# Patient Record
Sex: Male | Born: 1955 | Race: White | Hispanic: No | Marital: Single | Smoking: Former smoker
Health system: Southern US, Community
[De-identification: ages and names within clinical notes are randomized; demographics above are authoritative.]

## PROBLEM LIST (undated history)

## (undated) DIAGNOSIS — I739 Peripheral vascular disease, unspecified: Secondary | ICD-10-CM

## (undated) DIAGNOSIS — M549 Dorsalgia, unspecified: Secondary | ICD-10-CM

## (undated) DIAGNOSIS — M1990 Unspecified osteoarthritis, unspecified site: Secondary | ICD-10-CM

## (undated) DIAGNOSIS — G8929 Other chronic pain: Secondary | ICD-10-CM

## (undated) DIAGNOSIS — J449 Chronic obstructive pulmonary disease, unspecified: Secondary | ICD-10-CM

## (undated) DIAGNOSIS — I639 Cerebral infarction, unspecified: Secondary | ICD-10-CM

## (undated) DIAGNOSIS — I10 Essential (primary) hypertension: Secondary | ICD-10-CM

## (undated) DIAGNOSIS — Z972 Presence of dental prosthetic device (complete) (partial): Secondary | ICD-10-CM

## (undated) DIAGNOSIS — K219 Gastro-esophageal reflux disease without esophagitis: Secondary | ICD-10-CM

## (undated) DIAGNOSIS — R7301 Impaired fasting glucose: Secondary | ICD-10-CM

## (undated) DIAGNOSIS — I4891 Unspecified atrial fibrillation: Secondary | ICD-10-CM

## (undated) DIAGNOSIS — G4730 Sleep apnea, unspecified: Secondary | ICD-10-CM

## (undated) DIAGNOSIS — R079 Chest pain, unspecified: Secondary | ICD-10-CM

## (undated) DIAGNOSIS — R0602 Shortness of breath: Secondary | ICD-10-CM

## (undated) HISTORY — DX: Unspecified osteoarthritis, unspecified site: M19.90

## (undated) HISTORY — DX: Unspecified atrial fibrillation: I48.91

## (undated) HISTORY — DX: Chest pain, unspecified: R07.9

## (undated) HISTORY — DX: Gastro-esophageal reflux disease without esophagitis: K21.9

## (undated) HISTORY — DX: Chronic obstructive pulmonary disease, unspecified: J44.9

## (undated) HISTORY — DX: Impaired fasting glucose: R73.01

## (undated) HISTORY — DX: Shortness of breath: R06.02

## (undated) HISTORY — DX: Presence of dental prosthetic device (complete) (partial): Z97.2

---

## 1990-06-15 HISTORY — PX: GASTRIC RESTRICTION SURGERY: SHX653

## 1996-06-15 HISTORY — PX: LEG SURGERY: SHX1003

## 2001-06-15 HISTORY — PX: FOOT SURGERY: SHX648

## 2010-12-04 ENCOUNTER — Encounter: Payer: Medicare Other | Attending: Internal Medicine | Admitting: *Deleted

## 2010-12-04 DIAGNOSIS — Z713 Dietary counseling and surveillance: Secondary | ICD-10-CM | POA: Insufficient documentation

## 2010-12-31 ENCOUNTER — Encounter: Payer: Medicare Other | Attending: Internal Medicine | Admitting: *Deleted

## 2010-12-31 ENCOUNTER — Encounter: Payer: Self-pay | Admitting: *Deleted

## 2010-12-31 VITALS — Ht 75.0 in | Wt >= 6400 oz

## 2010-12-31 DIAGNOSIS — E669 Obesity, unspecified: Secondary | ICD-10-CM | POA: Insufficient documentation

## 2010-12-31 DIAGNOSIS — Z713 Dietary counseling and surveillance: Secondary | ICD-10-CM | POA: Insufficient documentation

## 2010-12-31 NOTE — Patient Instructions (Signed)
   Follow Pre-Op Diet  Add protein shake to breakfast  Increase ADL's and exercise as able

## 2010-12-31 NOTE — Progress Notes (Addendum)
  Follow-up visit: Pre-Operative Gastric Bypass Surgery  Medical Nutrition Therapy:  Appt start time: 0900 end time:  0930.  Assessment:  Primary concerns today: pre-operative bariatric surgery nutrition management for 6 months supervised weight loss. This is Mr. Tiggs 3rd month of supervised weight loss.  Weight today: 455.2lb Weight change: 17.6lb lost in one month Total weight lost: 17.6 lbs BMI: 56.9 Weight goal: 250lb   Dietary intake: Pt reports that he is now consuming 2 meals/day instead of just one. He has also been practicing bariatric surgery "pre-op" goals, and has eliminated sweetened beverages.  Fluid intake: 64 oz + (crystal light, water, coffee)  Estimated total protein intake: Not reported  Medications: Oxycodone, Nabumeton, Gralise, Naproxen  Supplementation: N/A  Recent physical activity:  Limited secondary to back pain.  Progress Towards Goal(s):  In progress.   Nutritional Diagnosis:  NI-1.4 Inadequate energy intake As related to frequent meal skipping.  As evidenced by pt consuming <1000 kcals/day.    Intervention:    Follow Pre-Op Diet  Add protein shake to breakfast  Increase ADL's and exercise as able  Monitoring/Evaluation:  Dietary intake, exercise, lap band fills, and body weight. Follow up in 1 month.

## 2011-02-02 ENCOUNTER — Encounter: Payer: Medicare Other | Attending: Internal Medicine | Admitting: *Deleted

## 2011-02-02 ENCOUNTER — Encounter: Payer: Self-pay | Admitting: *Deleted

## 2011-02-02 VITALS — Ht 75.0 in | Wt >= 6400 oz

## 2011-02-02 DIAGNOSIS — E669 Obesity, unspecified: Secondary | ICD-10-CM

## 2011-02-02 DIAGNOSIS — Z713 Dietary counseling and surveillance: Secondary | ICD-10-CM | POA: Insufficient documentation

## 2011-02-02 NOTE — Patient Instructions (Signed)
GOALS: Follow Pre-Op Diet  Continue protein shake to breakfast  Increase ADL's and exercise as able per MD

## 2011-02-02 NOTE — Progress Notes (Addendum)
Follow-up visit: Pre-Operative Gastric Bypass Surgery   Medical Nutrition Therapy: Appt start time: 0830 end time: 0900.   Assessment: Primary concerns today: 4rd supervised weight loss visit for pre-operative bariatric surgery nutrition management for 6 months supervised weight loss. Pt reports that he is still struggling with smoking cessation. He smokes ~10-14 cigarettes per day, down from 3 packs/day. He discussed the possibility of starting Chantix per MD in the next couple of months.   Weight today: 438.2 lb  Weight change:17 lbs lost in one month  Total weight lost: 34.6 lbs  BMI: 54.7% Weight goal: 250lb   Dietary intake: Pt reports that he tries to consume 2-3 meals/day. He uses protein shakes for breakfast (Atkin's protein shakes). For lunch and dinner he prepares lean meat (3-5 oz) such as lean ground beef, [place], chicken or fish, a cup of vegetable, and a side salad. OR a lean frozen meal. He has also been practicing bariatric surgery "pre-op" goals, and has continued with avoiding all sweetened beverages.   Fluid intake: 64 oz + (crystal light, water, coffee)  Estimated total protein intake: ~80-90g  Medications: Oxycodone, Nabumeton, Gralise, Naproxen  Supplementation: N/A   Recent physical activity: Limited secondary to back pain.   Progress Towards Goal(s): In progress.   Nutritional Diagnosis:  Physical inactivity related to recent family stress, being out of town, and reported busy schedule as  evidenced  by pt with very limited amounts of walking.  Intervention:  Follow Pre-Op Diet  Continue protein shake to breakfast  Increase ADL's and exercise as able per MD  Monitoring/Evaluation: Dietary intake, exercise, and body weight. Follow up in 1 month.

## 2011-03-05 ENCOUNTER — Ambulatory Visit: Payer: Medicare Other | Admitting: *Deleted

## 2011-03-09 ENCOUNTER — Encounter: Payer: Medicare Other | Attending: Internal Medicine | Admitting: *Deleted

## 2011-03-09 ENCOUNTER — Encounter: Payer: Self-pay | Admitting: *Deleted

## 2011-03-09 DIAGNOSIS — E669 Obesity, unspecified: Secondary | ICD-10-CM

## 2011-03-09 DIAGNOSIS — Z713 Dietary counseling and surveillance: Secondary | ICD-10-CM | POA: Insufficient documentation

## 2011-03-09 NOTE — Patient Instructions (Addendum)
Follow Pre-Op Diet  Avoid skipping meals Continue protein shake to breakfast  Increase ADL's and exercise as able per MD

## 2011-03-09 NOTE — Progress Notes (Addendum)
  Follow-up visit: Pre-Operative Gastric Bypass Surgery   Medical Nutrition Therapy: Appt start time: 1230 end time: 1300.   Assessment: Primary concerns today: 5th supervised weight loss visit for pre-operative bariatric surgery nutrition management for 6 months supervised weight loss. Pt reports that he has been under an extreme amount of stress over the past 2 months as he lost his brother in [place] and spent about 2 weeks down there. He reports that he has additional stress due to his car which broke down as well. Despite stress, pt continues to eat as best he can and still has a net weight loss. Pt reports that after his brother passed away he has smoked a little but has cut his usage down significantly. Has not had 1 pack over the past month.  Weight today: 434.2 lb  Weight change: 4 lbs lost in one month  Total weight lost: 38.6 lbs  BMI: 54.4%  Weight goal: 250lb   Dietary intake: Pt reports that he tries to consume 2-3 meals/day. He was able to pick up a protein shake in [place] which he used as a meal replacement. If he was eating out in [place] he was able to choose high protein foods and non-starchy vegetables. He notes consuming salads with chicken at fast food restaurants. He notes a few episodes of meal skipping yet has tried to do the best he can.   Fluid intake: 64 oz + (crystal light, water, coffee)  Estimated total protein intake: ~80-90g   Medications: Oxycodone, Nabumeton, Gralise, Naproxen  Supplementation: N/A   Recent physical activity: Limited secondary to back pain and limited time   Progress Towards Goal(s): In progress.   Nutritional Diagnosis:  Physical inactivity related to recent family stress, being out of town, and reported busy schedule as evidenced by pt with very limited amounts of walking.   Intervention:  Follow Pre-Op Diet  Avoid skipping meals Continue protein shake to breakfast  Increase ADL's and exercise as able per  MD  Monitoring/Evaluation: Dietary intake, exercise, and body weight. Follow up in 1 month for continued supervised weight loss program.

## 2011-03-10 ENCOUNTER — Other Ambulatory Visit (HOSPITAL_BASED_OUTPATIENT_CLINIC_OR_DEPARTMENT_OTHER): Payer: Self-pay | Admitting: Internal Medicine

## 2011-03-10 DIAGNOSIS — Z9884 Bariatric surgery status: Secondary | ICD-10-CM

## 2011-03-18 ENCOUNTER — Other Ambulatory Visit (HOSPITAL_COMMUNITY): Payer: Medicare Other

## 2011-03-25 ENCOUNTER — Other Ambulatory Visit (HOSPITAL_COMMUNITY): Payer: Medicare Other

## 2011-04-02 ENCOUNTER — Other Ambulatory Visit (HOSPITAL_BASED_OUTPATIENT_CLINIC_OR_DEPARTMENT_OTHER): Payer: Self-pay | Admitting: Internal Medicine

## 2011-04-02 DIAGNOSIS — Z9884 Bariatric surgery status: Secondary | ICD-10-CM

## 2011-04-06 ENCOUNTER — Other Ambulatory Visit (HOSPITAL_COMMUNITY): Payer: Medicare Other

## 2011-04-07 ENCOUNTER — Encounter: Payer: Medicare Other | Admitting: *Deleted

## 2011-04-07 ENCOUNTER — Other Ambulatory Visit (HOSPITAL_BASED_OUTPATIENT_CLINIC_OR_DEPARTMENT_OTHER): Payer: Self-pay | Admitting: Internal Medicine

## 2011-04-07 DIAGNOSIS — Z9884 Bariatric surgery status: Secondary | ICD-10-CM

## 2011-04-07 NOTE — Progress Notes (Deleted)
  Follow-up visit: Pre-Operative Gastric Bypass Surgery   Medical Nutrition Therapy: Appt start time: 1230 end time: 1300.   Assessment: Primary concerns today: 4th supervised weight loss visit for pre-operative bariatric surgery nutrition management for 6 months supervised weight loss. Pt reports that he has been under an extreme amount of stress over the past 2 months as he lost his brother in [place] and spent about 2 weeks down there. He reports that he has additional stress due to his car which broke down as well. Despite stress, pt continues to eat as best he can and still has a net weight loss. Pt reports that after his brother passed away he has smoked a little but has cut his usage down significantly. Has not had 1 pack over the past month.   Weight today: 434.2 lb  Weight change: 4 lbs lost in one month  Total weight lost: 38.6 lbs  BMI: 54.4%  Weight goal: 250lb   Dietary intake: Pt reports that he tries to consume 2-3 meals/day. He was able to pick up a protein shake in [place] which he used as a meal replacement. If he was eating out in [place] he was able to choose high protein foods and non-starchy vegetables. He notes consuming salads with chicken at fast food restaurants. He notes a few episodes of meal skipping yet has tried to do the best he can.   Fluid intake: 64 oz + (crystal light, water, coffee)  Estimated total protein intake: ~80-90g   Medications: Oxycodone, Nabumeton, Gralise, Naproxen  Supplementation: N/A   Recent physical activity: Limited secondary to back pain and limited time   Progress Towards Goal(s): In progress.   Nutritional Diagnosis:  Physical inactivity related to recent family stress, being out of town, and reported busy schedule as evidenced by pt with very limited amounts of walking.   Intervention:  Follow Pre-Op Diet  Avoid skipping meals  Continue protein shake to breakfast  Increase ADL's and exercise as able per  MD  Monitoring/Evaluation: Dietary intake, exercise, and body weight. Follow up in 1 month for continued supervised weight loss program.

## 2011-04-10 ENCOUNTER — Other Ambulatory Visit (HOSPITAL_BASED_OUTPATIENT_CLINIC_OR_DEPARTMENT_OTHER): Payer: Self-pay | Admitting: Internal Medicine

## 2011-04-10 ENCOUNTER — Ambulatory Visit (HOSPITAL_COMMUNITY)
Admission: RE | Admit: 2011-04-10 | Discharge: 2011-04-10 | Disposition: A | Discharge status: Home / Self Care | Payer: Medicare Other | Source: Ambulatory Visit | Attending: Internal Medicine | Admitting: Internal Medicine

## 2011-04-10 DIAGNOSIS — Z9884 Bariatric surgery status: Secondary | ICD-10-CM

## 2011-04-10 DIAGNOSIS — Z01818 Encounter for other preprocedural examination: Secondary | ICD-10-CM | POA: Insufficient documentation

## 2011-04-10 IMAGING — CR DG UGI W/ KUB
11 of 15 series · 15 of 24 positions shown · non-contrast
Comparison: None.

CLINICAL DATA: Prior gastric stapling, preop gastric bypass

UPPER GI SERIES WITH KUB
TECHNIQUE: Routine upper GI series was performed with thin and
high density barium.
Fluoroscopy Time: 1.23 minutes

[Series 1: run · 3 of 7 slices shown (1 of 10)]
[im 1/7]
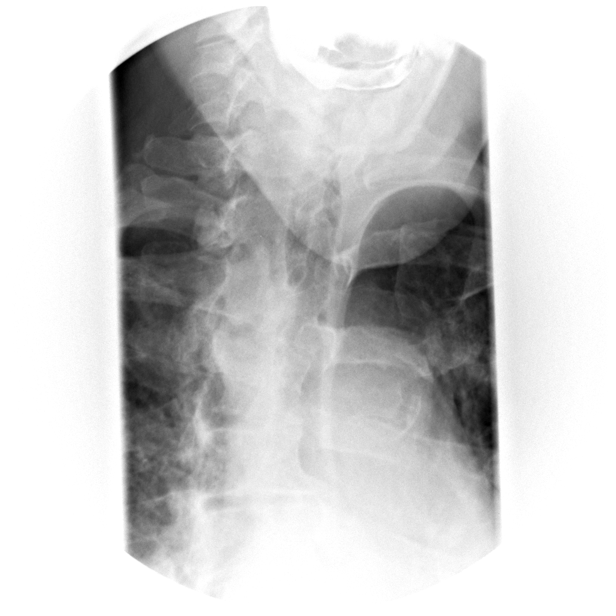
[im 4/7]
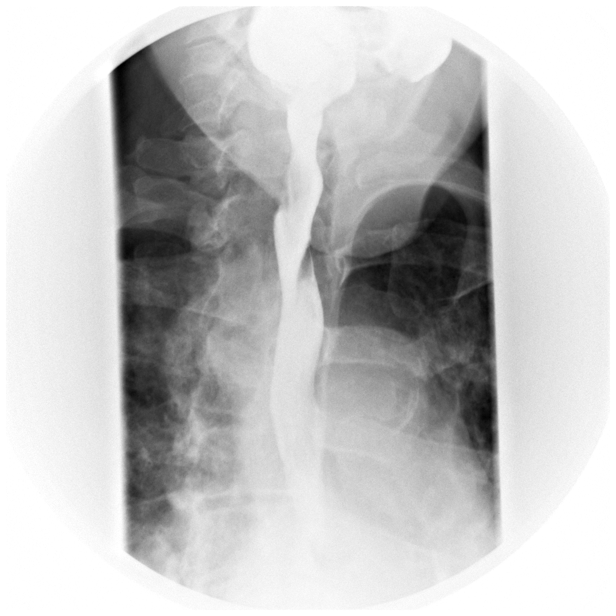
[im 7/7]
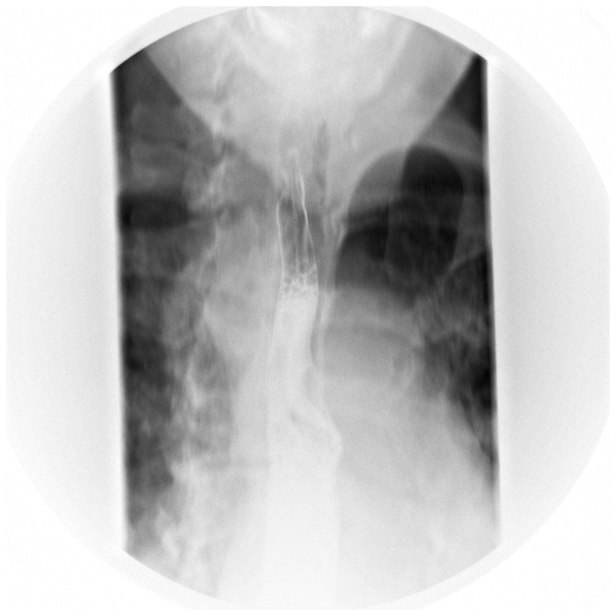

[Series 2: run · 2 of 4 slices shown (2 of 10)]
[im 1/4]
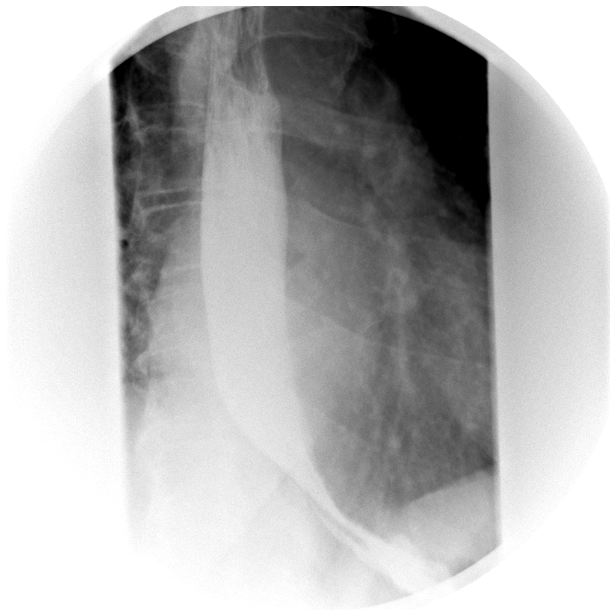
[im 4/4]
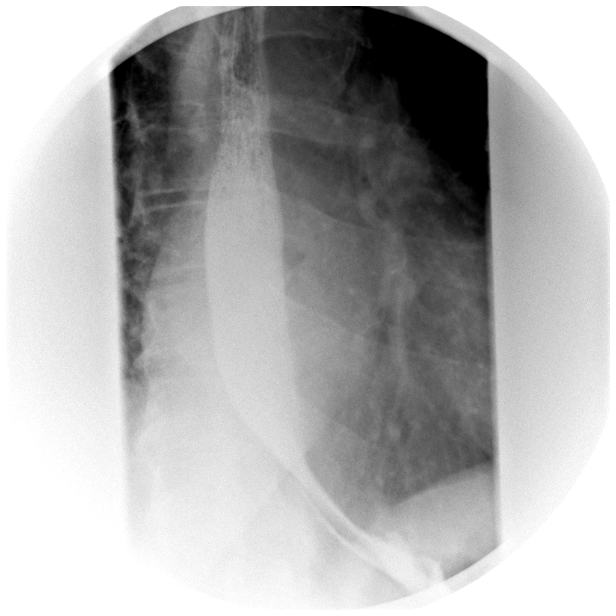

[run (3 of 10)]
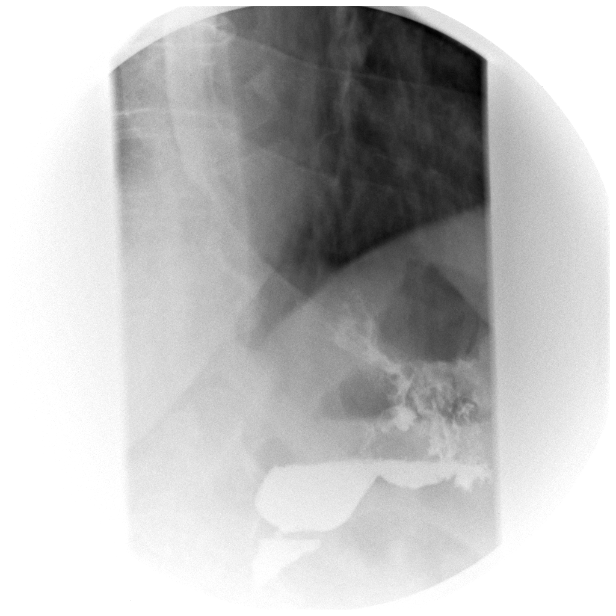

[Series 4: run · 2 of 3 slices shown (4 of 10)]
[im 1/3]
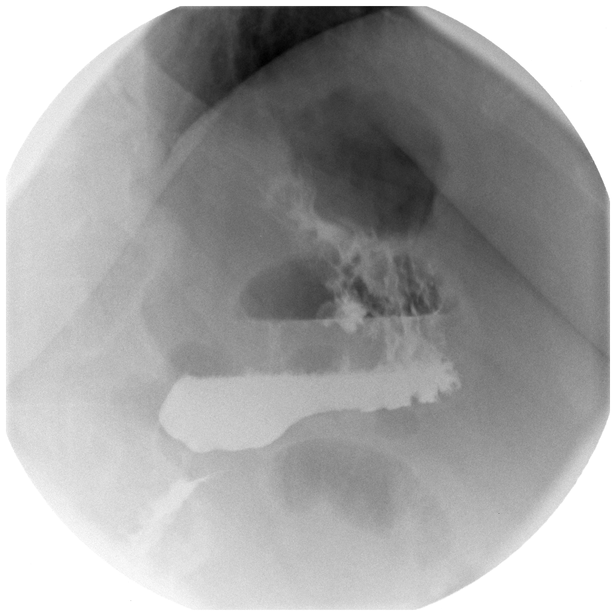
[im 3/3]
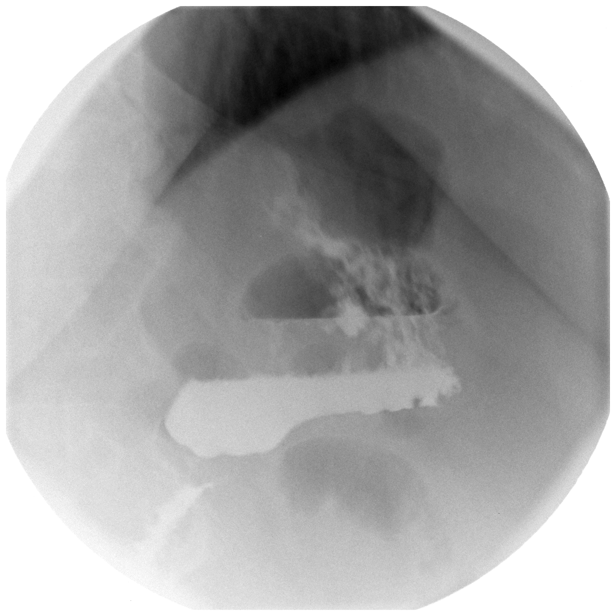

[run (5 of 10)]
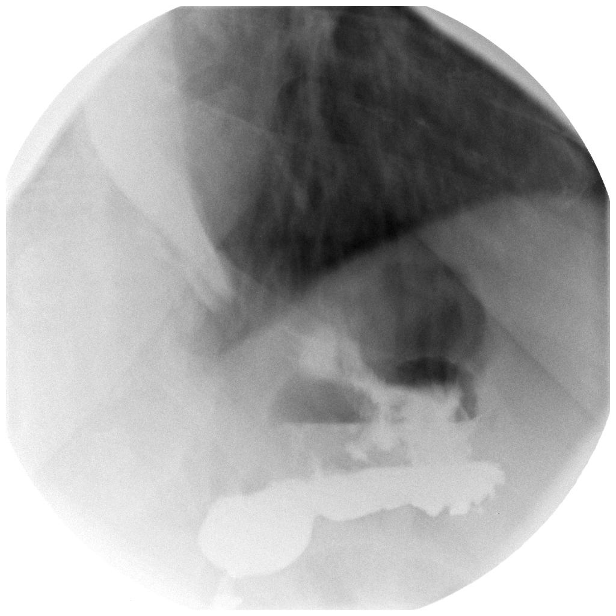

[run (6 of 10)]
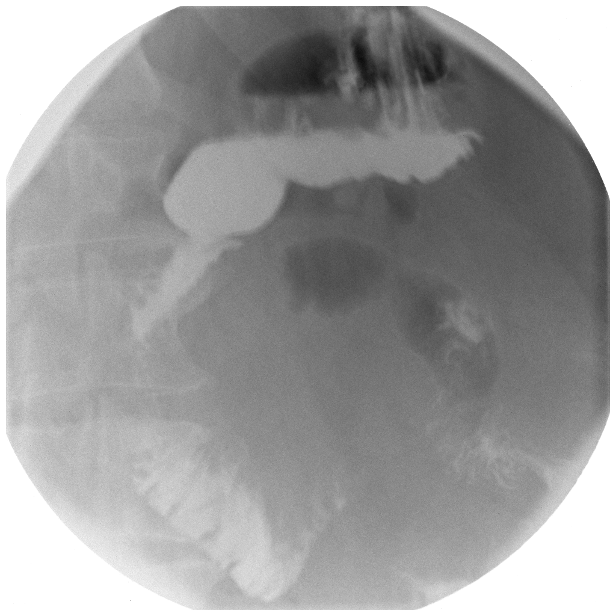

[run (7 of 10)]
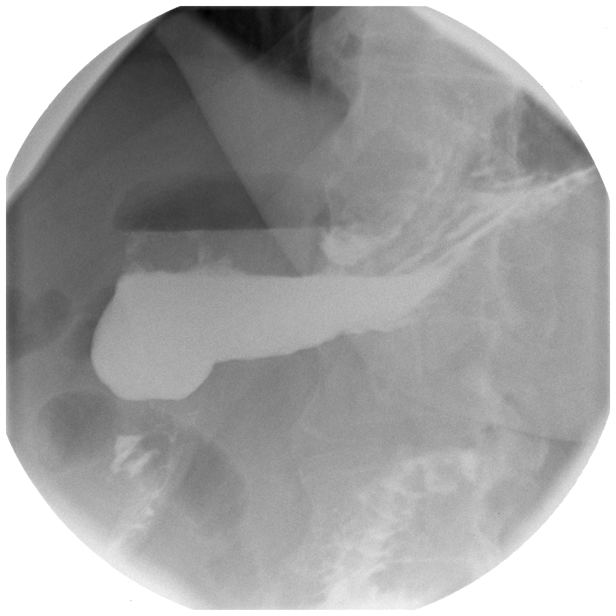

[run (8 of 10)]
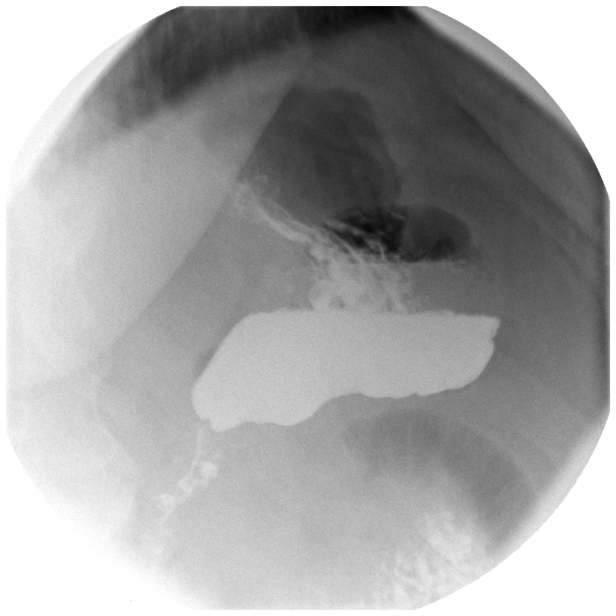

[run (9 of 10)]
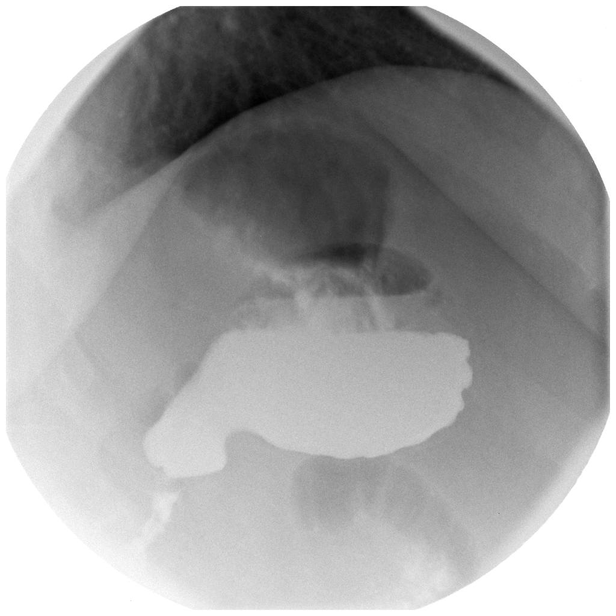

[run (10 of 10)]
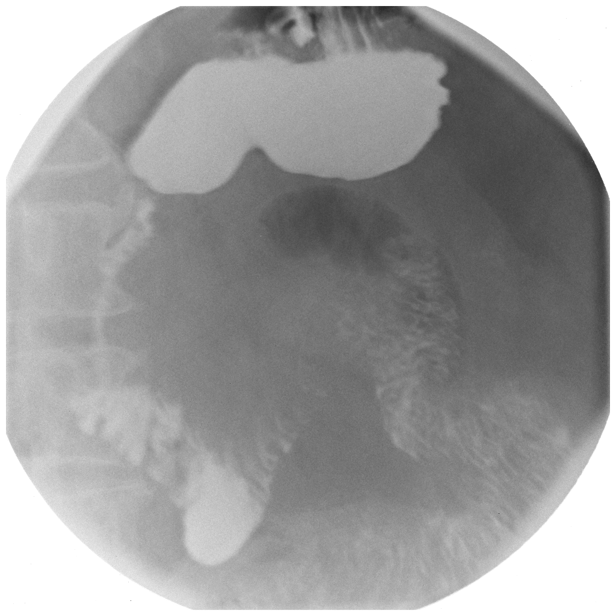

[view not recorded]
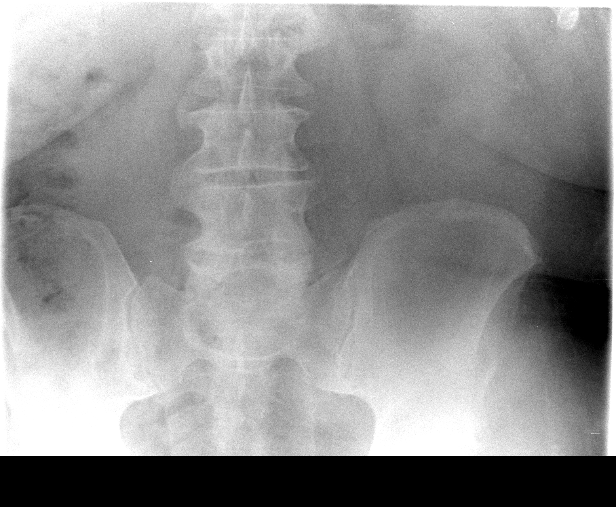

[15 of 24 positions shown; findings below may reference images not displayed]

FINDINGS: Scout radiograph demonstrates surgical clips in the
region of the GE junction.

Normal oral phase of swallowing.  No laryngeal penetration or
tracheal aspiration.

Essentially normal esophageal peristalsis.  No fixed esophageal
narrowing or stricture.

No gastroesophageal reflux was demonstrated.

Gas within the presumed excluded stomach, suggesting some degree of
communication with the remaining stomach.

Following an initial delay, contrast flows into the normal proximal
duodenum.
IMPRESSION: Surgical clips related to prior gastric stapling.  Gas within the
presumed excluded stomach, suggesting some degree of communication
with the remaining stomach.

No gastroesophageal reflux is seen.

Normal proximal duodenum.

## 2011-04-16 ENCOUNTER — Encounter: Payer: Medicare Other | Attending: Surgery | Admitting: *Deleted

## 2011-04-16 ENCOUNTER — Encounter: Payer: Self-pay | Admitting: *Deleted

## 2011-04-16 DIAGNOSIS — E669 Obesity, unspecified: Secondary | ICD-10-CM

## 2011-04-16 DIAGNOSIS — Z713 Dietary counseling and surveillance: Secondary | ICD-10-CM | POA: Insufficient documentation

## 2011-04-16 NOTE — Patient Instructions (Signed)
Goals: Follow Pre-Op Diet  Avoid skipping meals  Limit concentrated sweets and sugars Try using protein shakes for breakfast/snacks Increase ADL's and exercise as able per MD

## 2011-04-16 NOTE — Progress Notes (Signed)
  Follow-up visit: Pre-Operative Gastric Bypass Surgery   Medical Nutrition Therapy: Appt start time: 0824 end time: 0853.   Assessment: Primary concerns today: 6th supervised weight loss visit for pre-operative bariatric surgery nutrition management for 6 months supervised weight loss. Kyle [name] reports that he continues to have excessive stress in his home life due to family issues and financial problems with social security. Due to his weight he continues to have low energy levels. Despite all of his hardship, he has done fairly well with his dietary intake yet notes limited physical activity due to his schedule and dealing with issues.  Weight today: 433.2 lb  Weight change: 1 lbs lost in one month  Total weight lost: 39.6 lbs  BMI: 50.2%  Weight goal: 250lb   Dietary Intake:  B (7-8): Special K w/ berries (1 1/2 cup) w/ skim milk) OR 2 eggs (cooked in pam) S (10): Handful of almonds L (12-1): Chicken breast or Tuna w/ 1 cup vegetable or small salad S (4-6): Atkin's snack protein bars) D (6-8): 4-6 oz hamburger patty (lean), small salad w/ light dressing or 1 cup green beans S (8-9): Some candy?  Fluid intake: 64 oz + (crystal light, water, coffee, all sugar-free)  Estimated total protein intake: ~80-90g   Medications: Oxycodone, Nabumeton, Gralise, Naproxen, Coumadin Supplementation: N/A   Recent physical activity: Limited secondary to back pain and limited time; Trying to do more walking through ADL's  Progress Towards Goal(s): Some progress.   Nutritional Diagnosis:  Physical inactivity related to recent family stress, being out of town, and reported busy schedule as evidenced by pt with very limited amounts of walking.   Excessive carbohydrate intake related to stress eating as evidenced by pt consuming more candy and sweets due to emotional stress.  Intervention:  Follow Pre-Op Diet  Avoid skipping meals  Limit concentrated sweets and sugars Try using protein shakes  for breakfast/snacks Increase ADL's and exercise as able per MD  Monitoring/Evaluation: Dietary intake, exercise, and body weight. Follow up with [facility] and PCP on additional surgery requirements.

## 2011-05-05 ENCOUNTER — Encounter (INDEPENDENT_AMBULATORY_CARE_PROVIDER_SITE_OTHER): Payer: Self-pay | Admitting: General Surgery

## 2011-05-06 ENCOUNTER — Encounter (INDEPENDENT_AMBULATORY_CARE_PROVIDER_SITE_OTHER): Payer: Self-pay | Admitting: Surgery

## 2011-05-06 ENCOUNTER — Ambulatory Visit (INDEPENDENT_AMBULATORY_CARE_PROVIDER_SITE_OTHER): Payer: Medicare Other | Admitting: Surgery

## 2011-05-06 VITALS — BP 132/78 | HR 64 | Temp 97.2°F | Resp 20 | Ht 78.0 in | Wt >= 6400 oz

## 2011-05-06 DIAGNOSIS — E669 Obesity, unspecified: Secondary | ICD-10-CM

## 2011-05-06 NOTE — Progress Notes (Signed)
Patient ID: Kyle [name], male   DOB: 06-22-1955, 55 y.o.   MRN: [id number] Chief Complaint:  Failed gastric plication  History of Present Illness:  Kyle [name] is an 55 y.o. male who underwent a gastric plication in [place] in 1994 by a Meridee Score at [facility]. His postoperative followup consisted of staple removal but he received no counseling. An upper GI series which are reviewed looks like he may have had a transverse gastroplasty is partially broken down. He is interested in a gastric bypass after one one of our seminars. I think the next step would be crossed in endoscope him and I have set that up for  Dr. Ezzard Standing and myself.  Past Medical History  Diagnosis Date  . Atrial fibrillation   . GERD (gastroesophageal reflux disease)   . Shortness of breath   . Arthritis   . Wears dentures     Past Surgical History  Procedure Date  . Gastric restriction surgery 1992  . Leg surgery 1998    calcium deposit removed on right  leg   . Foot surgery 2003    right foot surgery from work accident     Medications Prior to Admission  Medication Sig Dispense Refill  . nabumetone (RELAFEN) 500 MG tablet Take 500 mg by mouth 2 (two) times daily.        . naproxen (NAPROSYN) 250 MG tablet Take 250 mg by mouth at bedtime as needed.        Marland Kitchen oxycodone (OXYCONTIN) 30 MG TB12 Take 30 mg by mouth every 12 (twelve) hours.        Marland Kitchen warfarin (COUMADIN) 7.5 MG tablet Take 7.5 mg by mouth daily.         No current facility-administered medications on file as of 05/06/2011.   No Known Allergies History reviewed. No pertinent family history. Social History:   reports that he quit smoking about 2 months ago. He has never used smokeless tobacco. He reports that he drinks alcohol. He reports that he does not use illicit drugs.   REVIEW OF SYSTEMS - PERTINENT POSITIVES ONLY: noncontributory  Physical Exam:   Blood pressure 132/78, pulse 64, temperature 97.2 F (36.2 C), temperature  source Temporal, resp. rate 20, height 6\' 6"  (1.981 m), weight 418 lb 8 oz (189.83 kg). Body mass index is 48.36 kg/(m^2).  Gen:  No acute distress.  Well nourished and well groomed.   Neurological: Alert and oriented to person, place, and time. Coordination normal.  Head: Normocephalic and atraumatic.  Eyes: Conjunctivae are normal. Pupils are equal, round, and reactive to light. No scleral icterus.  Neck: Normal range of motion. Neck supple. No tracheal deviation or thyromegaly present.  Cardiovascular:  Atrial fibrillation  Respiratory: Effort normal.  No respiratory distress. No chest wall tenderness. Breath sounds normal.  No wheezes, rales or rhonchi.  GI: Soft. Bowel sounds are normal. The abdomen is soft and nontender.  There is no rebound and no guarding. GU:   Musculoskeletal: Normal range of motion. Extremities are nontender.  Lymphadenopathy: No cervical, preauricular, postauricular or axillary adenopathy is present Skin: Skin is warm and dry. No rash noted. No diaphoresis. No erythema. No pallor. No clubbing, cyanosis, or edema.  Pscyh: Normal mood and affect. Behavior is normal. Judgment and thought content normal.   LABORATORY RESULTS: No results found for this or any previous visit (from the past 48 hour(s)).  RADIOLOGY RESULTS: No results found.  Problem List: Active Problems:  * No active hospital  problems. *    Assessment & Plan: Failed gastroplasty.  Will schedule EGD to assess anatomy    Matt B. Daphine Deutscher, MD, [facility] [phone] beeper [phone]  05/06/2011 5:12 PM

## 2011-05-18 ENCOUNTER — Encounter (HOSPITAL_COMMUNITY): Payer: Self-pay | Admitting: *Deleted

## 2011-05-18 ENCOUNTER — Ambulatory Visit (HOSPITAL_COMMUNITY)
Admission: RE | Admit: 2011-05-18 | Discharge: 2011-05-18 | Disposition: A | Discharge status: Home / Self Care | Payer: Medicare Other | Source: Ambulatory Visit | Attending: Surgery | Admitting: Surgery

## 2011-05-18 ENCOUNTER — Encounter (HOSPITAL_COMMUNITY)
Admission: RE | Disposition: A | Discharge status: Home / Self Care | Payer: Self-pay | Source: Ambulatory Visit | Attending: Surgery

## 2011-05-18 DIAGNOSIS — Z79899 Other long term (current) drug therapy: Secondary | ICD-10-CM | POA: Insufficient documentation

## 2011-05-18 DIAGNOSIS — I4891 Unspecified atrial fibrillation: Secondary | ICD-10-CM | POA: Insufficient documentation

## 2011-05-18 DIAGNOSIS — Z7901 Long term (current) use of anticoagulants: Secondary | ICD-10-CM | POA: Insufficient documentation

## 2011-05-18 DIAGNOSIS — K449 Diaphragmatic hernia without obstruction or gangrene: Secondary | ICD-10-CM

## 2011-05-18 DIAGNOSIS — K9589 Other complications of other bariatric procedure: Secondary | ICD-10-CM | POA: Insufficient documentation

## 2011-05-18 DIAGNOSIS — Z87891 Personal history of nicotine dependence: Secondary | ICD-10-CM | POA: Insufficient documentation

## 2011-05-18 DIAGNOSIS — K219 Gastro-esophageal reflux disease without esophagitis: Secondary | ICD-10-CM | POA: Insufficient documentation

## 2011-05-18 DIAGNOSIS — Y838 Other surgical procedures as the cause of abnormal reaction of the patient, or of later complication, without mention of misadventure at the time of the procedure: Secondary | ICD-10-CM | POA: Insufficient documentation

## 2011-05-18 DIAGNOSIS — E669 Obesity, unspecified: Secondary | ICD-10-CM

## 2011-05-18 HISTORY — PX: ESOPHAGOGASTRODUODENOSCOPY: SHX5428

## 2011-05-18 SURGERY — EGD (ESOPHAGOGASTRODUODENOSCOPY)
Anesthesia: Moderate Sedation

## 2011-05-18 MED ORDER — FENTANYL CITRATE 0.05 MG/ML IJ SOLN
INTRAMUSCULAR | Status: AC
  Filled 2011-05-18: qty 4

## 2011-05-18 MED ORDER — BUTAMBEN-TETRACAINE-BENZOCAINE 2-2-14 % EX AERO
INHALATION_SPRAY | CUTANEOUS | Status: DC | PRN
  Administered 2011-05-18: 3 via TOPICAL

## 2011-05-18 MED ORDER — SODIUM CHLORIDE 0.9 % IV SOLN
INTRAVENOUS | Status: DC
  Administered 2011-05-18: 10:00:00 via INTRAVENOUS

## 2011-05-18 MED ORDER — MIDAZOLAM HCL 10 MG/2ML IJ SOLN
INTRAMUSCULAR | Status: DC | PRN
  Administered 2011-05-18: 1 mg via INTRAVENOUS
  Administered 2011-05-18: 2 mg via INTRAVENOUS
  Administered 2011-05-18: 1 mg via INTRAVENOUS

## 2011-05-18 MED ORDER — MIDAZOLAM HCL 10 MG/2ML IJ SOLN
INTRAMUSCULAR | Status: AC
  Filled 2011-05-18: qty 4

## 2011-05-18 MED ORDER — FENTANYL CITRATE 0.05 MG/ML IJ SOLN
INTRAMUSCULAR | Status: DC | PRN
  Administered 2011-05-18 (×2): 25 ug via INTRAVENOUS

## 2011-05-18 NOTE — H&P (View-Only) (Signed)
Patient ID: Kyle [name], male   DOB: 06/30/1955, 55 y.o.   MRN: [id number] Chief Complaint:  Failed gastric plication  History of Present Illness:  Kyle [name] is an 55 y.o. male who underwent a gastric plication in [place] in 1994 by a Michael Butler at [facility]. His postoperative followup consisted of staple removal but he received no counseling. An upper GI series which are reviewed looks like he may have had a transverse gastroplasty is partially broken down. He is interested in a gastric bypass after one one of our seminars. I think the next step would be crossed in endoscope him and I have set that up for  Dr. Newman and myself.  Past Medical History  Diagnosis Date  . Atrial fibrillation   . GERD (gastroesophageal reflux disease)   . Shortness of breath   . Arthritis   . Wears dentures     Past Surgical History  Procedure Date  . Gastric restriction surgery 1992  . Leg surgery 1998    calcium deposit removed on right  leg   . Foot surgery 2003    right foot surgery from work accident     Medications Prior to Admission  Medication Sig Dispense Refill  . nabumetone (RELAFEN) 500 MG tablet Take 500 mg by mouth 2 (two) times daily.        . naproxen (NAPROSYN) 250 MG tablet Take 250 mg by mouth at bedtime as needed.        . oxycodone (OXYCONTIN) 30 MG TB12 Take 30 mg by mouth every 12 (twelve) hours.        . warfarin (COUMADIN) 7.5 MG tablet Take 7.5 mg by mouth daily.         No current facility-administered medications on file as of 05/06/2011.   No Known Allergies History reviewed. No pertinent family history. Social History:   reports that he quit smoking about 2 months ago. He has never used smokeless tobacco. He reports that he drinks alcohol. He reports that he does not use illicit drugs.   REVIEW OF SYSTEMS - PERTINENT POSITIVES ONLY: noncontributory  Physical Exam:   Blood pressure 132/78, pulse 64, temperature 97.2 F (36.2 C), temperature  source Temporal, resp. rate 20, height 6' 6" (1.981 m), weight 418 lb 8 oz (189.83 kg). Body mass index is 48.36 kg/(m^2).  Gen:  No acute distress.  Well nourished and well groomed.   Neurological: Alert and oriented to person, place, and time. Coordination normal.  Head: Normocephalic and atraumatic.  Eyes: Conjunctivae are normal. Pupils are equal, round, and reactive to light. No scleral icterus.  Neck: Normal range of motion. Neck supple. No tracheal deviation or thyromegaly present.  Cardiovascular:  Atrial fibrillation  Respiratory: Effort normal.  No respiratory distress. No chest wall tenderness. Breath sounds normal.  No wheezes, rales or rhonchi.  GI: Soft. Bowel sounds are normal. The abdomen is soft and nontender.  There is no rebound and no guarding. GU:   Musculoskeletal: Normal range of motion. Extremities are nontender.  Lymphadenopathy: No cervical, preauricular, postauricular or axillary adenopathy is present Skin: Skin is warm and dry. No rash noted. No diaphoresis. No erythema. No pallor. No clubbing, cyanosis, or edema.  Pscyh: Normal mood and affect. Behavior is normal. Judgment and thought content normal.   LABORATORY RESULTS: No results found for this or any previous visit (from the past 48 hour(s)).  RADIOLOGY RESULTS: No results found.  Problem List: Active Problems:  * No active hospital   problems. *    Assessment & Plan: Failed gastroplasty.  Will schedule EGD to assess anatomy    Matt B. Ioannis Schuh, MD, FACS  [facility] [phone] beeper [phone]  05/06/2011 5:12 PM      

## 2011-05-18 NOTE — Interval H&P Note (Signed)
History and Physical Interval Note:  05/18/2011 10:52 AM  Kyle [name]  has presented today for surgery, with the diagnosis of * No pre-op diagnosis entered *  The various methods of treatment have been discussed with the patient and family. After consideration of risks, benefits and other options for treatment, the patient has consented to  Procedure(s): ESOPHAGOGASTRODUODENOSCOPY (EGD) as a surgical intervention .  The patients' history has been reviewed, patient examined, no change in status, stable for surgery.  I have reviewed the patients' chart and labs.  Questions were answered to the patient's satisfaction.     [name] H

## 2011-05-18 NOTE — Interval H&P Note (Signed)
History and Physical Interval Note:  05/18/2011 10:51 AM  Kyle [name]  has presented today for surgery, with the diagnosis of * No pre-op diagnosis entered *  The various methods of treatment have been discussed with the patient and family. After consideration of risks, benefits and other options for treatment, the patient has consented to  Procedure(s): ESOPHAGOGASTRODUODENOSCOPY (EGD) as a surgical intervention .  The patients' history has been reviewed, patient examined, no change in status, stable for surgery.  I have reviewed the patients' chart and labs.  Questions were answered to the patient's satisfaction.     Kyle [name] H

## 2011-05-18 NOTE — Op Note (Signed)
Kyle [name], Kyle [name]              ACCOUNT NO.:  [id number]  MEDICAL RECORD NO.:  [id number]  LOCATION:  [facility]                         FACILITY:  [facility]  PHYSICIAN:  Sandria Bales. Ezzard Standing, M.D.  DATE OF BIRTH:  1955/07/02  DATE OF PROCEDURE:  05/18/2011                              OPERATIVE REPORT  PREOPERATIVE DIAGNOSIS:  History of stomach stapling in 1994, in [place].  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  PROCEDURE:  Esophagogastroduodenoscopy.  SURGEON:  Sandria Bales. Ezzard Standing, M.D.  FIRST ASSISTANT:  None.  ANESTHESIA:  50 mcg of fentanyl, 4 mg of Versed.  COMPLICATIONS:  None.  INDICATION FOR PROCEDURE:  Kyle [name] is a 55 year old white male who sees Dr. Baltazar Najjar from a medical standpoint, Dr. Susa Griffins from a cardiology standpoint, seeing Dr. [name] with a history of a prior stomach stapling in 1994 in [place].  He has failed to lose weight and is interested in pursuing other avenues for weight loss surgery.  The indication and potential complications of this procedure were explained to the patient.  The patient is on Coumadin and understands the additional risks of bleeding in addition to risk of perforation and injuries using the endoscope.  OPERATIVE NOTE:  A time-out was held.  The patient was monitored with pulse oximetry, blood pressure cuff, EKG, and had 2L nasal O2 flowing during the procedure.  The back of his throat was anesthetized with Cetacaine x3.  He was then given a total of 50 mcg of fentanyl, 4 mg of Versed, and a flexible Pentax endoscope was passed down the back of the throat into the stomach pouch.  I advanced the scope through the pylorus into the duodenum, which was unremarkable.  The pylorus was unremarkable.  The stomach looked as if there was a long pouch, which went from the EG junction (at about 40 cm) down to an opening, which was at 55 cm (from the incisors) which is a 15 cm pouch. The  staple line was broken down along the greater curvature side. The breakdown opening is at 45 cm. I was able to pass the scope around there and took pictures of this.  There was no ulcer.  I think the pouch is large with a breakdown of the staple line.  I was able to put the scope through both the breakdown of staple line and the the end of the pouch and retroflex and take pictures of this.  The patient has a small hiatal hernia about 3 cm in size and the EG junction was at 40 cm and the esophagus was unremarkable.  I did not do any biopsy of the stomach because the patient is on Coumadin and there was nothing to biopsy from a mass or lesion standpoint.  The patient will follow up with Dr. Luretha Murphy, MD to discuss the options for him both medical and surgical for weight loss.  The patient tolerated the procedure well and was transferred to recovery room in good condition.   Sandria Bales. Ezzard Standing, M.D., FACS   DHN/MEDQ  D:  05/18/2011  T:  05/18/2011  Job:  [id number]  cc:   Maxwell Caul, M.D. Fax:  [phone]  Richard A. Alanda Amass, M.D. Fax: [phone]  [place] Daphine Deutscher, MD [address], [address] [place] [address]

## 2011-05-18 NOTE — Brief Op Note (Signed)
05/18/2011  11:33 AM  PATIENT:  Kyle [name], 55 y.o., male, MRN: [id number]  PREOP DIAGNOSIS:  History of stomach stapling (1994)  POSTOP DIAGNOSIS:   Failed stomach stapling, 3 cm HH  PROCEDURE:   Procedure(s): ESOPHAGOGASTRODUODENOSCOPY (EGD)  SURGEON:   Ovidio Kin, M.D.  ASSISTANT:   none  ANESTHESIA:   IV sedation  * No anesthesia staff entered *  Moderate Sedation  EBL:  -  ml  BLOOD ADMINISTERED: none  DRAINS: none   LOCAL MEDICATIONS USED:   -  SPECIMEN:   -  COUNTS CORRECT:  YES  INDICATIONS FOR PROCEDURE:  Kyle [name] is a 55 y.o. (DOB: 1956-06-11) white male whose primary care physician is Terald Sleeper, MD and comes for upper endoscopy to evaluate a prior stomach "stapling" in [place] in 1994.   The indications and risks of the surgery were explained to the patient.  The risks include, but are not limited to, infection, bleeding, and nerve injury.  Note dictated to:   #[id number]  DN  05/18/2011

## 2011-05-19 ENCOUNTER — Ambulatory Visit: Payer: Medicare Other | Admitting: *Deleted

## 2011-05-28 ENCOUNTER — Encounter: Payer: Self-pay | Admitting: *Deleted

## 2011-05-28 ENCOUNTER — Encounter: Payer: Medicare Other | Attending: Surgery | Admitting: *Deleted

## 2011-05-28 DIAGNOSIS — Z713 Dietary counseling and surveillance: Secondary | ICD-10-CM | POA: Insufficient documentation

## 2011-05-28 NOTE — Progress Notes (Signed)
  Pre-Op visit: Pre-Operative Gastric Bypass Surgery  Medical Nutrition Therapy:  Appt start time: 1055 end time:  1120.  Assessment:  Primary concerns today: post-operative bariatric surgery nutrition management. Pt still plans to have Gastric Bypass surgery per Dr. Daphine Deutscher. He recently had procedure done with Dr. Ezzard Standing and has a follow-up appointment with Dr. Daphine Deutscher next week to discuss possible plans for surgery. Pt continues to lose weight following Pre-Op Bariatric Surgery Diet and reports increase exercise levels.  Weight today: 420.2 lbs Weight change: 13 lbs since last visit Total weight lost: 52.6 lbs total BMI: 48.7% Weight goal: 250 lbs   Surgery date: TBA Start weight at [facility]: 455.2 lbs  24-hr recall: No food recall given at time of visit  Fluid intake: 64-100 oz + Estimated total protein intake: 80-95g  Medications: No changes since last visit Supplementation: Not taking any supplement at this time  Using straws: NO Drinking while eating: No Hair loss: n/a Carbonated beverages: No N/V/D/C: n/a  Recent physical activity:  Walking/lifting weights (5 times/week for 45 minutes+)  Progress Towards Goal(s):  In progress.  Handouts given during visit include:  Pre-Op Diet  Protein Shake Supplements   Nutritional Diagnosis:  [place]-3.3 Overweight/obesity As related to s/p "failed" stomach stapling surgery.  As evidenced by pt with BMI >30%.    Intervention:  Nutrition education/reinforcement.  Monitoring/Evaluation:  Dietary intake, exercise, lap band fills, and body weight. Follow up PRN for Pre-Op Nutrition Education as surgery is scheduled.

## 2011-05-28 NOTE — Patient Instructions (Signed)
Follow:    Continue to follow Pre-Op Diet (High Protein, Low Carb Diet) prior to surgery  Continue with regular physical activity  Follow-up at [facility] for Pre-Op Nutrition Class as surgery is scheduled. Contact Jeryn Cerney as needed with questions/concerns.

## 2011-06-01 ENCOUNTER — Encounter (HOSPITAL_COMMUNITY): Payer: Self-pay | Admitting: Surgery

## 2011-06-03 ENCOUNTER — Encounter (INDEPENDENT_AMBULATORY_CARE_PROVIDER_SITE_OTHER): Payer: Self-pay | Admitting: Surgery

## 2011-06-03 ENCOUNTER — Ambulatory Visit (INDEPENDENT_AMBULATORY_CARE_PROVIDER_SITE_OTHER): Payer: Medicare Other | Admitting: Surgery

## 2011-06-03 VITALS — BP 142/82 | HR 82 | Temp 97.9°F | Resp 18 | Ht 78.0 in | Wt >= 6400 oz

## 2011-06-03 DIAGNOSIS — E669 Obesity, unspecified: Secondary | ICD-10-CM

## 2011-06-03 NOTE — Patient Instructions (Signed)
Wait to hear from Leandrew Koyanagi

## 2011-06-03 NOTE — Progress Notes (Signed)
Chief Complaint:  Failed gastroplasty with weight regain  History of Present Illness:  Kyle [name] is an 55 y.o. male Who had a vertical gastroplasty in [place] in 1993. The surgeon, Dr. Meridee Score at the [facility] in [place] is no longer in practice. Onalee Hua scoped him revealing that this linear staple line is broken down probably about 10 cm from the end. I discussed gastric bypass and sleeve resection with him. He does want something to try to help him lose weight. I told him that we had just started doing the sleeves in a week consider that if we were able to do a gastric bypass. I think his first choice would be to have a gastric bypass.  We'll go and proceed to try to get him on the schedule for sometime in January for laparoscopy possible open conversion of previous failed gastroplasty to either Roux-en-Y gastric bypass or have sleeve gastrectomy as a fallback position.  Past Medical History  Diagnosis Date  . Atrial fibrillation   . GERD (gastroesophageal reflux disease)   . Shortness of breath   . Arthritis   . Wears dentures     Past Surgical History  Procedure Date  . Gastric restriction surgery 1992  . Leg surgery 1998    calcium deposit removed on right  leg   . Foot surgery 2003    right foot surgery from work accident   . Esophagogastroduodenoscopy 05/18/2011    Procedure: ESOPHAGOGASTRODUODENOSCOPY (EGD);  Surgeon: Kandis Cocking, MD;  Location: [facility];  Service: Endoscopy;  Laterality: N/A;    Medications Prior to Admission  Medication Sig Dispense Refill  . nabumetone (RELAFEN) 500 MG tablet Take 500 mg by mouth 2 (two) times daily.        . naproxen (NAPROSYN) 250 MG tablet Take 250 mg by mouth at bedtime as needed.        Marland Kitchen oxycodone (OXYCONTIN) 30 MG TB12 Take 30 mg by mouth every 12 (twelve) hours.        Marland Kitchen warfarin (COUMADIN) 7.5 MG tablet Take 7.5 mg by mouth daily.         No current facility-administered medications on file as of  06/03/2011.   No Known Allergies No family history on file. Social History:   reports that he quit smoking about 2 months ago. He has never used smokeless tobacco. He reports that he drinks alcohol. He reports that he does not use illicit drugs.   REVIEW OF SYSTEMS - PERTINENT POSITIVES ONLY: Significant is the Coumadin and he takes chronically for atrial fibrillation.  Physical Exam:   Blood pressure 142/82, pulse 82, temperature 97.9 F (36.6 C), temperature source Temporal, resp. rate 18, height 6\' 6"  (1.981 m), weight 417 lb 4 oz (189.263 kg). Body mass index is 48.22 kg/(m^2).  Gen:  No acute distress.  Well nourished and well groomed.   Neurological: Alert and oriented to person, place, and time. Coordination normal.  Head: Normocephalic and atraumatic.  Eyes: Conjunctivae are normal. Pupils are equal, round, and reactive to light. No scleral icterus.  Neck: Normal range of motion. Neck supple. No tracheal deviation or thyromegaly present.  Cardiovascular:  SR without murmurs or gallops Respiratory: Effort normal.  No respiratory distress. No chest wall tenderness. Breath sounds normal.  No wheezes, rales or rhonchi.  GI: Soft. Bowel sounds are normal. The abdomen is soft and nontender.  There is no rebound and no guarding. GU:   Musculoskeletal: Normal range of motion. Extremities are nontender.  Lymphadenopathy: No cervical, preauricular, postauricular or axillary adenopathy is present Skin: Skin is warm and dry. No rash noted. No diaphoresis. No erythema. No pallor. No clubbing, cyanosis, or edema.  Pscyh: Normal mood and affect. Behavior is normal. Judgment and thought content normal.   LABORATORY RESULTS: No results found for this or any previous visit (from the past 48 hour(s)).  RADIOLOGY RESULTS: No results found.  Problem List: Active Problems:  * No active hospital problems. *    Assessment & Plan: Morbidly obese man with failed linear gastric plasty.  Plan  laparoscopic or open roux y gastric bypass or sleeve gastrectomy    Matt B. Daphine Deutscher, MD, [facility] [phone] beeper [phone]  06/03/2011 3:39 PM

## 2011-06-20 ENCOUNTER — Other Ambulatory Visit (INDEPENDENT_AMBULATORY_CARE_PROVIDER_SITE_OTHER): Payer: Self-pay | Admitting: Surgery

## 2011-06-20 LAB — CBC WITH DIFFERENTIAL/PLATELET
Eosinophils Relative: 3 % (ref 0–5)
Lymphocytes Relative: 17 % (ref 12–46)
Lymphs Abs: 1.5 10*3/uL (ref 0.7–4.0)
MCV: 87.3 fL (ref 78.0–100.0)
Platelets: 222 10*3/uL (ref 150–400)
RBC: 5.27 MIL/uL (ref 4.22–5.81)
WBC: 8.5 10*3/uL (ref 4.0–10.5)

## 2011-06-20 LAB — COMPREHENSIVE METABOLIC PANEL
ALT: 19 U/L (ref 0–53)
Albumin: 4.1 g/dL (ref 3.5–5.2)
CO2: 24 mEq/L (ref 19–32)
Calcium: 9.1 mg/dL (ref 8.4–10.5)
Chloride: 103 mEq/L (ref 96–112)
Creat: 0.75 mg/dL (ref 0.50–1.35)
Potassium: 4.3 mEq/L (ref 3.5–5.3)
Sodium: 138 mEq/L (ref 135–145)
Total Protein: 7.1 g/dL (ref 6.0–8.3)

## 2011-06-20 LAB — LIPID PANEL: Cholesterol: 159 mg/dL (ref 0–200)

## 2011-06-20 LAB — TSH: TSH: 2.742 u[IU]/mL (ref 0.350–4.500)

## 2011-06-22 LAB — H. PYLORI ANTIBODY, IGG: H Pylori IgG: 0.49 {ISR}

## 2011-07-13 DIAGNOSIS — I509 Heart failure, unspecified: Secondary | ICD-10-CM | POA: Diagnosis not present

## 2011-07-13 DIAGNOSIS — I4891 Unspecified atrial fibrillation: Secondary | ICD-10-CM | POA: Diagnosis not present

## 2011-07-16 DIAGNOSIS — Z7901 Long term (current) use of anticoagulants: Secondary | ICD-10-CM | POA: Diagnosis not present

## 2011-07-16 DIAGNOSIS — K219 Gastro-esophageal reflux disease without esophagitis: Secondary | ICD-10-CM | POA: Diagnosis not present

## 2011-07-16 DIAGNOSIS — I495 Sick sinus syndrome: Secondary | ICD-10-CM | POA: Diagnosis not present

## 2011-07-16 DIAGNOSIS — I4891 Unspecified atrial fibrillation: Secondary | ICD-10-CM | POA: Diagnosis not present

## 2011-07-17 DIAGNOSIS — F4322 Adjustment disorder with anxiety: Secondary | ICD-10-CM | POA: Diagnosis not present

## 2011-07-27 DIAGNOSIS — F4322 Adjustment disorder with anxiety: Secondary | ICD-10-CM | POA: Diagnosis not present

## 2011-08-10 DIAGNOSIS — I4891 Unspecified atrial fibrillation: Secondary | ICD-10-CM | POA: Diagnosis not present

## 2011-08-10 DIAGNOSIS — Z7901 Long term (current) use of anticoagulants: Secondary | ICD-10-CM | POA: Diagnosis not present

## 2011-08-11 DIAGNOSIS — M7989 Other specified soft tissue disorders: Secondary | ICD-10-CM | POA: Diagnosis not present

## 2011-08-11 DIAGNOSIS — I83893 Varicose veins of bilateral lower extremities with other complications: Secondary | ICD-10-CM | POA: Diagnosis not present

## 2011-08-29 DIAGNOSIS — R5381 Other malaise: Secondary | ICD-10-CM | POA: Diagnosis not present

## 2011-08-29 DIAGNOSIS — J320 Chronic maxillary sinusitis: Secondary | ICD-10-CM | POA: Diagnosis not present

## 2011-08-29 DIAGNOSIS — R5383 Other fatigue: Secondary | ICD-10-CM | POA: Diagnosis not present

## 2011-08-29 DIAGNOSIS — M549 Dorsalgia, unspecified: Secondary | ICD-10-CM | POA: Diagnosis not present

## 2011-08-29 DIAGNOSIS — J029 Acute pharyngitis, unspecified: Secondary | ICD-10-CM | POA: Diagnosis not present

## 2011-09-09 DIAGNOSIS — I4891 Unspecified atrial fibrillation: Secondary | ICD-10-CM | POA: Diagnosis not present

## 2011-09-09 DIAGNOSIS — Z7901 Long term (current) use of anticoagulants: Secondary | ICD-10-CM | POA: Diagnosis not present

## 2011-09-17 DIAGNOSIS — E781 Pure hyperglyceridemia: Secondary | ICD-10-CM | POA: Diagnosis not present

## 2011-09-17 DIAGNOSIS — F17200 Nicotine dependence, unspecified, uncomplicated: Secondary | ICD-10-CM | POA: Diagnosis not present

## 2011-09-17 DIAGNOSIS — R05 Cough: Secondary | ICD-10-CM | POA: Diagnosis not present

## 2011-09-17 DIAGNOSIS — J40 Bronchitis, not specified as acute or chronic: Secondary | ICD-10-CM | POA: Diagnosis not present

## 2011-09-17 DIAGNOSIS — R0602 Shortness of breath: Secondary | ICD-10-CM | POA: Diagnosis not present

## 2011-09-21 DIAGNOSIS — I4891 Unspecified atrial fibrillation: Secondary | ICD-10-CM | POA: Diagnosis not present

## 2011-09-21 DIAGNOSIS — I495 Sick sinus syndrome: Secondary | ICD-10-CM | POA: Diagnosis not present

## 2011-09-21 DIAGNOSIS — Z7901 Long term (current) use of anticoagulants: Secondary | ICD-10-CM | POA: Diagnosis not present

## 2011-10-01 ENCOUNTER — Encounter: Payer: Medicare Other | Attending: Surgery | Admitting: *Deleted

## 2011-10-01 DIAGNOSIS — E669 Obesity, unspecified: Secondary | ICD-10-CM

## 2011-10-01 DIAGNOSIS — Z713 Dietary counseling and surveillance: Secondary | ICD-10-CM | POA: Diagnosis not present

## 2011-10-01 DIAGNOSIS — Z01818 Encounter for other preprocedural examination: Secondary | ICD-10-CM | POA: Insufficient documentation

## 2011-10-04 ENCOUNTER — Encounter: Payer: Self-pay | Admitting: *Deleted

## 2011-10-04 NOTE — Progress Notes (Signed)
  Bariatric Class:  Appt start time: 0830 end time:  0930.  Pre-Operative Nutrition Class  Patient was seen on 10/01/2011 for Pre-Operative Bariatric Surgery Education at the [facility].  Surgery date: 10/19/11 Surgery type: RYGB  Last weight @ [facility]: 420.2 lbs (05/27/12)  Samples given per MNT protocol: Bariatric Advantage Multivitamin Lot # [id number] Exp: 09/13  Bariatric Advantage Calcium Citrate Lot # [id number] Exp: 09/13  Bariatric Advantage B-12 dots Lot # [id number] Exp: 05/13  Celebrate Vitamins Multivitamin Lot # [id number] Exp: 06/14  Celebrate Vitamins Calcium Citrate Lot # [id number] Exp: 07/13  Celebrate Vitamins B-12 dots Lot # [id number] Exp: 07/14  Corliss Marcus  Lot # [id number] Exp: 06/4  The following the learning objective met by the patient during this course:   Identifies Pre-Op Dietary Goals and will begin 2 weeks pre-operatively   Identifies appropriate sources of fluids and proteins   States protein recommendations and appropriate sources pre and post-operatively  Identifies Post-Operative Dietary Goals and will follow for 2 weeks post-operatively  Identifies appropriate multivitamin and calcium sources  Describes the need for physical activity post-operatively and will follow MD recommendations  States when to call healthcare provider regarding medication questions or post-operative complications  Handouts given during class include:  Pre-Op Bariatric Surgery Diet Handout  Protein Shake Handout  Post-Op Bariatric Surgery Nutrition Handout  BELT Program Information Flyer  Support Group Information Flyer  Follow-Up Plan: Patient will follow-up at [facility] 2 weeks post operatively for diet advancement per MD.

## 2011-10-04 NOTE — Patient Instructions (Signed)
Follow:   Pre-Op Diet per MD 2 weeks prior to surgery  Phase 2- Liquids (clear/full) 2 weeks after surgery  Vitamin/Mineral/Calcium guidelines for purchasing bariatric supplements  Exercise guidelines pre and post-op per MD  Follow-up at [facility] in 2 weeks post-op for diet advancement. Contact Sela Falk as needed with questions/concerns. 

## 2011-10-05 DIAGNOSIS — Z7901 Long term (current) use of anticoagulants: Secondary | ICD-10-CM | POA: Diagnosis not present

## 2011-10-05 DIAGNOSIS — I4891 Unspecified atrial fibrillation: Secondary | ICD-10-CM | POA: Diagnosis not present

## 2011-10-08 ENCOUNTER — Encounter (HOSPITAL_COMMUNITY): Payer: Self-pay | Admitting: Pharmacy Technician

## 2011-10-13 ENCOUNTER — Encounter (HOSPITAL_COMMUNITY): Payer: Self-pay

## 2011-10-13 ENCOUNTER — Ambulatory Visit (HOSPITAL_COMMUNITY)
Admission: RE | Admit: 2011-10-13 | Discharge: 2011-10-13 | Disposition: A | Discharge status: Home / Self Care | Payer: Medicare Other | Source: Ambulatory Visit | Attending: Surgery | Admitting: Surgery

## 2011-10-13 ENCOUNTER — Encounter (HOSPITAL_COMMUNITY)
Admission: RE | Admit: 2011-10-13 | Discharge: 2011-10-13 | Disposition: A | Discharge status: Home / Self Care | Payer: Medicare Other | Source: Ambulatory Visit | Attending: Surgery | Admitting: Surgery

## 2011-10-13 ENCOUNTER — Telehealth (INDEPENDENT_AMBULATORY_CARE_PROVIDER_SITE_OTHER): Payer: Self-pay

## 2011-10-13 ENCOUNTER — Other Ambulatory Visit (INDEPENDENT_AMBULATORY_CARE_PROVIDER_SITE_OTHER): Payer: Self-pay | Admitting: Surgery

## 2011-10-13 DIAGNOSIS — Z01812 Encounter for preprocedural laboratory examination: Secondary | ICD-10-CM | POA: Diagnosis not present

## 2011-10-13 DIAGNOSIS — I4891 Unspecified atrial fibrillation: Secondary | ICD-10-CM | POA: Insufficient documentation

## 2011-10-13 DIAGNOSIS — J449 Chronic obstructive pulmonary disease, unspecified: Secondary | ICD-10-CM | POA: Diagnosis not present

## 2011-10-13 DIAGNOSIS — I517 Cardiomegaly: Secondary | ICD-10-CM | POA: Diagnosis not present

## 2011-10-13 DIAGNOSIS — J4489 Other specified chronic obstructive pulmonary disease: Secondary | ICD-10-CM | POA: Insufficient documentation

## 2011-10-13 DIAGNOSIS — G4730 Sleep apnea, unspecified: Secondary | ICD-10-CM | POA: Insufficient documentation

## 2011-10-13 DIAGNOSIS — Y838 Other surgical procedures as the cause of abnormal reaction of the patient, or of later complication, without mention of misadventure at the time of the procedure: Secondary | ICD-10-CM | POA: Insufficient documentation

## 2011-10-13 DIAGNOSIS — T85698A Other mechanical complication of other specified internal prosthetic devices, implants and grafts, initial encounter: Secondary | ICD-10-CM | POA: Insufficient documentation

## 2011-10-13 DIAGNOSIS — J42 Unspecified chronic bronchitis: Secondary | ICD-10-CM | POA: Diagnosis not present

## 2011-10-13 DIAGNOSIS — Z01818 Encounter for other preprocedural examination: Secondary | ICD-10-CM | POA: Diagnosis not present

## 2011-10-13 DIAGNOSIS — Z01811 Encounter for preprocedural respiratory examination: Secondary | ICD-10-CM | POA: Diagnosis not present

## 2011-10-13 HISTORY — DX: Peripheral vascular disease, unspecified: I73.9

## 2011-10-13 HISTORY — DX: Sleep apnea, unspecified: G47.30

## 2011-10-13 HISTORY — DX: Dorsalgia, unspecified: M54.9

## 2011-10-13 HISTORY — DX: Other chronic pain: G89.29

## 2011-10-13 LAB — DIFFERENTIAL
Basophils Absolute: 0 10*3/uL (ref 0.0–0.1)
Lymphocytes Relative: 15 % (ref 12–46)
Monocytes Absolute: 1 10*3/uL (ref 0.1–1.0)
Neutro Abs: 7.9 10*3/uL — ABNORMAL HIGH (ref 1.7–7.7)
Neutrophils Relative %: 73 % (ref 43–77)

## 2011-10-13 LAB — APTT: aPTT: 44 seconds — ABNORMAL HIGH (ref 24–37)

## 2011-10-13 LAB — CBC
MCH: 28.2 pg (ref 26.0–34.0)
MCHC: 33.1 g/dL (ref 30.0–36.0)
RDW: 15.8 % — ABNORMAL HIGH (ref 11.5–15.5)

## 2011-10-13 LAB — PROTIME-INR: Prothrombin Time: 20.2 seconds — ABNORMAL HIGH (ref 11.6–15.2)

## 2011-10-13 LAB — COMPREHENSIVE METABOLIC PANEL
ALT: 32 U/L (ref 0–53)
Albumin: 3.7 g/dL (ref 3.5–5.2)
Alkaline Phosphatase: 105 U/L (ref 39–117)
BUN: 15 mg/dL (ref 6–23)
Calcium: 9.5 mg/dL (ref 8.4–10.5)
Potassium: 4.1 mEq/L (ref 3.5–5.1)
Sodium: 137 mEq/L (ref 135–145)
Total Protein: 7.4 g/dL (ref 6.0–8.3)

## 2011-10-13 LAB — SURGICAL PCR SCREEN: Staphylococcus aureus: POSITIVE — AB

## 2011-10-13 IMAGING — CR DG CHEST 2V
3 series · 3 of 3 positions shown · non-contrast
Comparison: None.

CLINICAL DATA: Preop for gastric bypass.  History of atrial
fibrillation and shortness of breath.  Ex-smoker.

CHEST - 2 VIEW

[w chest pa *]
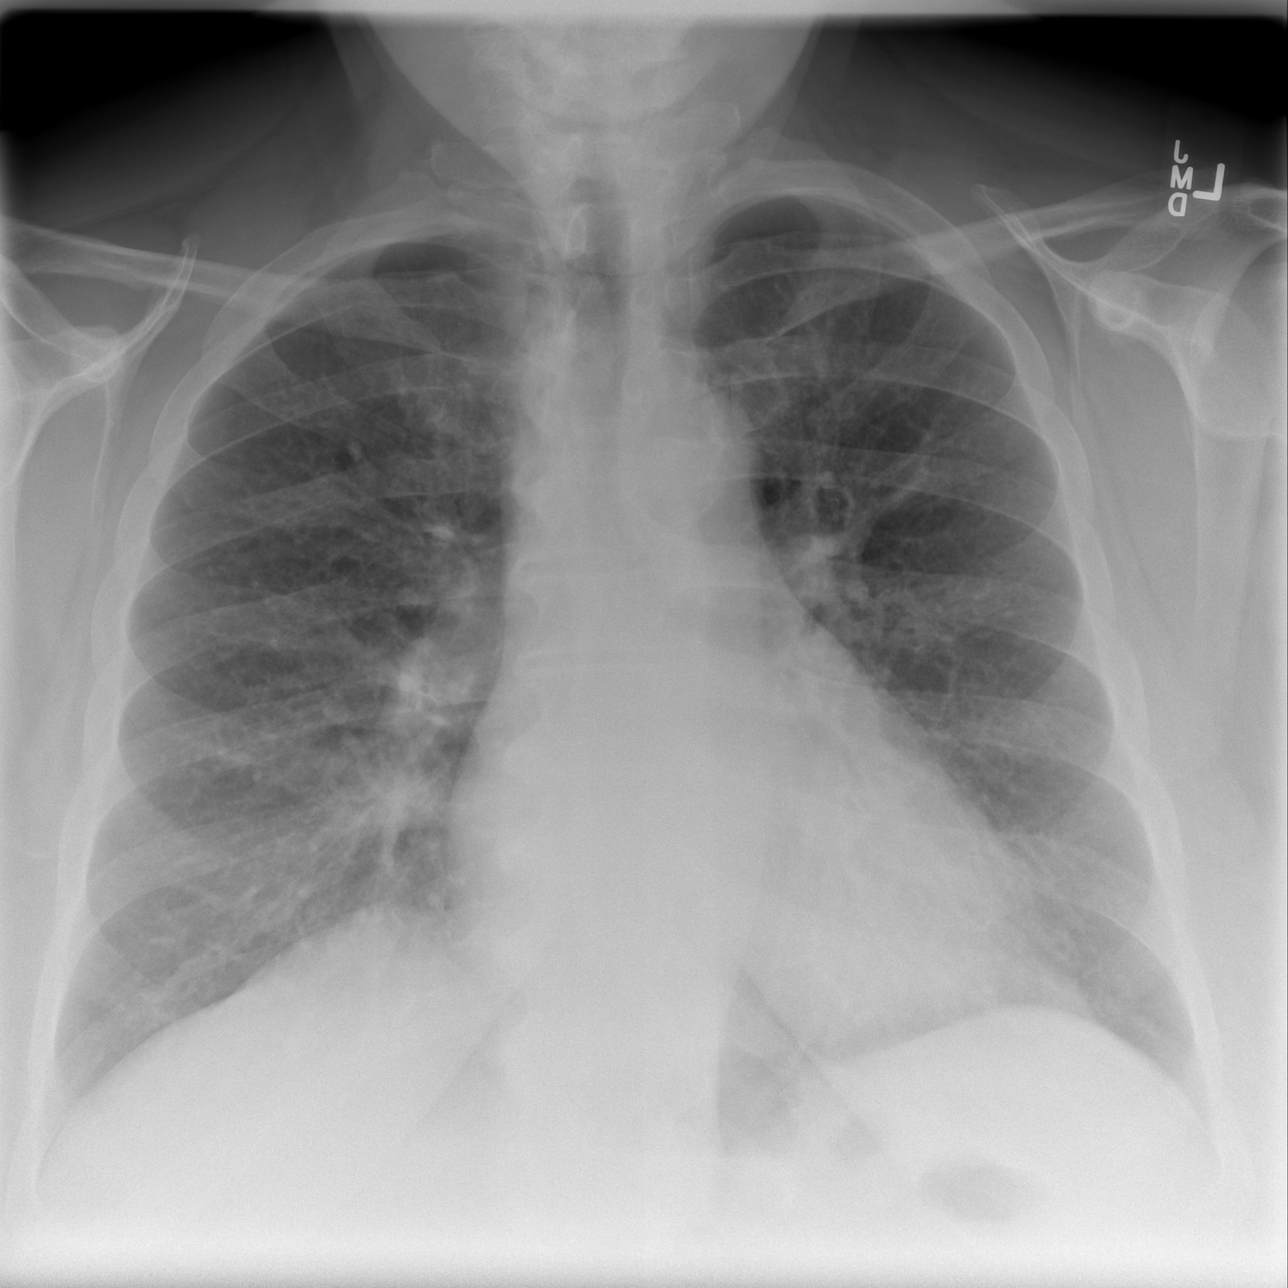

[w chest lat * (1 of 2)]
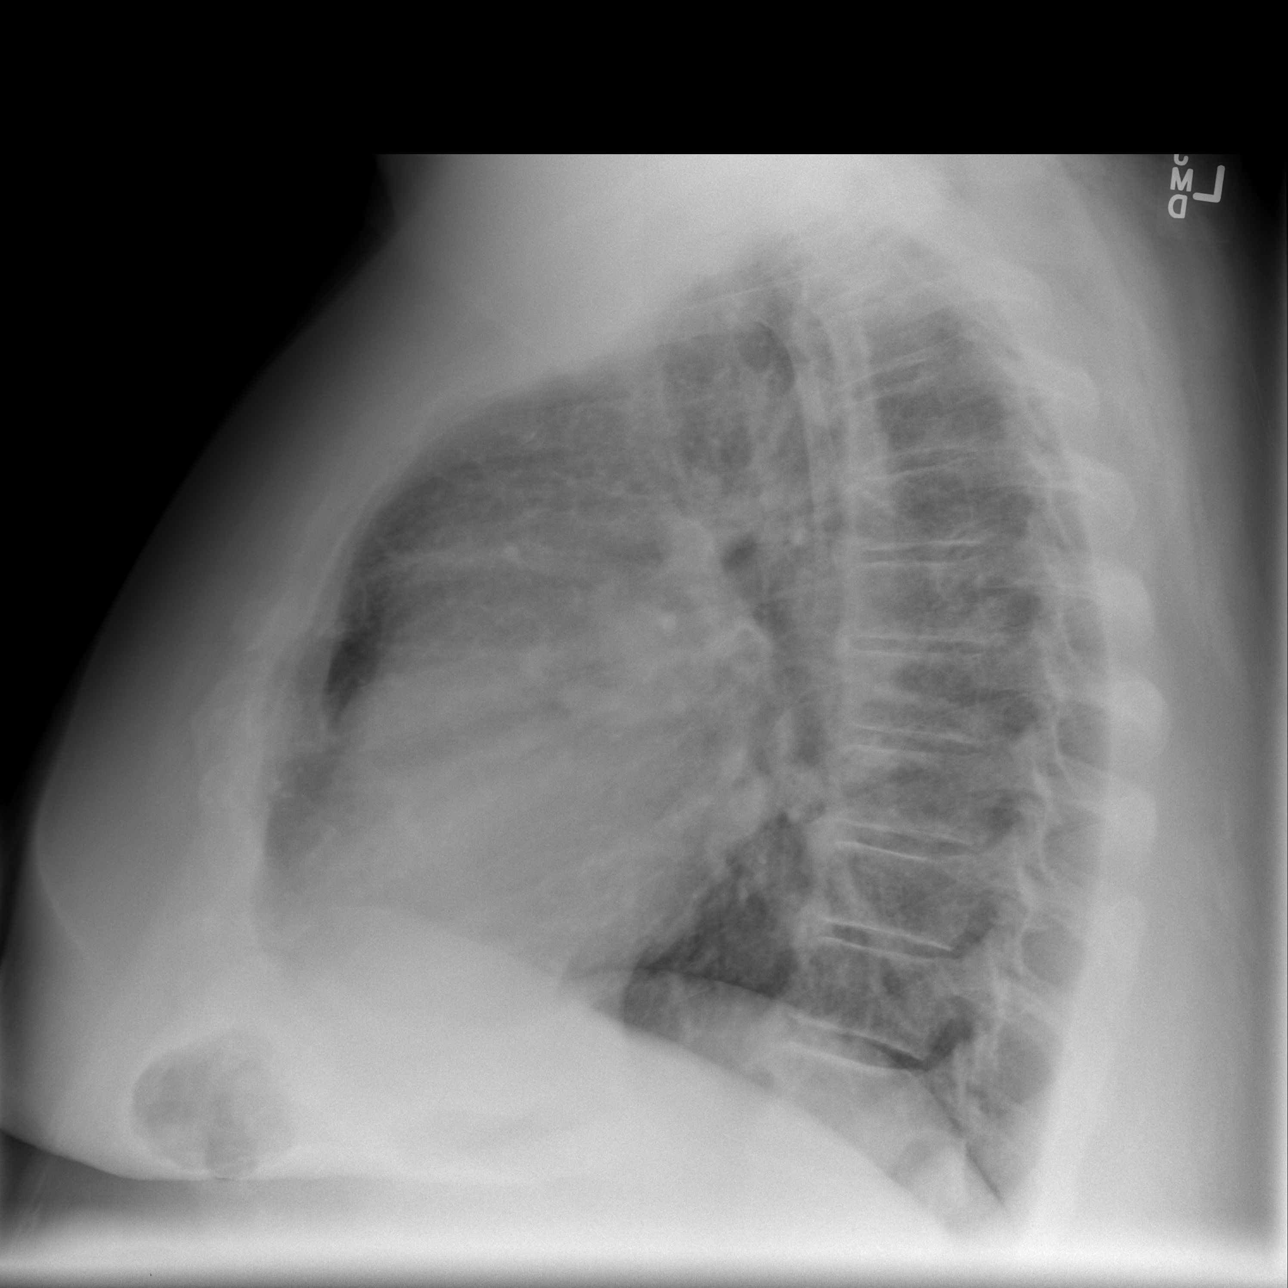

[w chest lat * (2 of 2)]
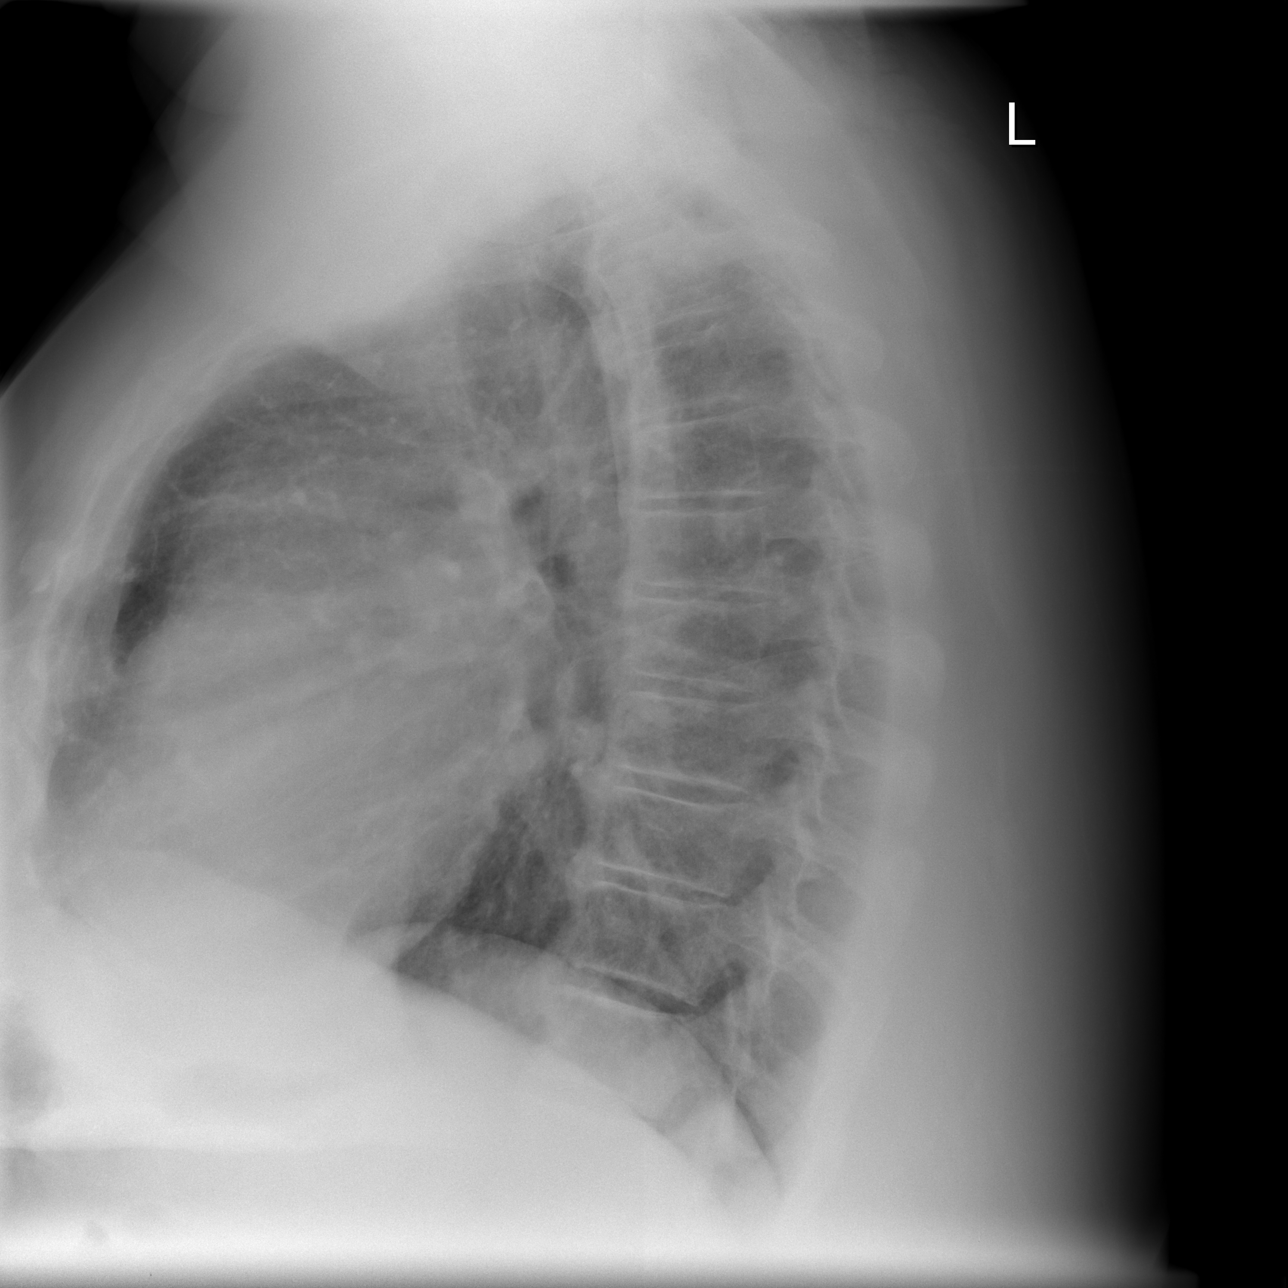

[3 of 3 positions shown; findings below may reference images not displayed]

FINDINGS: Hyperinflation.  Moderate thoracic spondylosis.

Midline trachea.  Mild cardiomegaly.  Age advanced aortic
atherosclerosis. Mediastinal contours otherwise within normal
limits.  No pleural effusion or pneumothorax.  Diffuse
peribronchial thickening.  Clear lungs.
IMPRESSION: 1. No acute cardiopulmonary disease.
2.  COPD/chronic bronchitis.
3. Cardiomegaly without congestive failure.

## 2011-10-13 NOTE — Telephone Encounter (Signed)
Pat calling to notify Dr Daphine Deutscher that the pt had a abnormal PT 20.2,PTT 44,and INR 1.69. The pt just stopped his Coumadin yesterday. The pt is scheduled for 5/6 for a gastric bypass sugery. They did order for the pt to have a repeat PT,PTT,and INR the day of surgery. They also ordered a Bari Bed.

## 2011-10-13 NOTE — Pre-Procedure Instructions (Addendum)
EKG REPORT AND CARDIOLOGY OFFICE  NOTE 09/21/11 -WITH RESULTS OF STRESS TESTING DONE AT [facility] AND CARDIAC CLEARANCE FOR BARIATRIC SURGERY ON PT'S CHART FROM DR. Alanda Amass. [facility] FAXED PT'S H&P AND BARIATRIC DOCUMENTS--FORMS PLACED ON PT'S CHART. CXR AND CBC WITH DIFF, CMET, PT, PTT WERE DONE TODAY AT [facility]--PT'S LAST COUMADIN WAS 10/12/11--PT, INR WILL BE REPEATED DAY OF SURGERY. PT DOES NOT YET HAVE HIS BOWEL PREP INSTRUCTIONS FROM DR. MARTIN--BUT IS TO SEE DR. MARTIN FRI 10/16/11 --AND INSTRUCTED TO BE SURE TO GET HIS BOWEL PREP INSTRUCTIONS AT THAT TIME.

## 2011-10-13 NOTE — Patient Instructions (Signed)
YOUR SURGERY IS SCHEDULED ON:  Monday  5/6  AT 11:50 AM  REPORT TO [place] [facility] AT: 9:30 AM      PHONE # FOR SHORT STAY IS [phone]  FOLLOW BOWEL PREP INSTRUCTIONS FROM DR. MARTIN'S OFFICE.  DO NOT EAT OR DRINK ANYTHING AFTER MIDNIGHT THE NIGHT BEFORE YOUR SURGERY.  YOU MAY BRUSH YOUR TEETH, RINSE OUT YOUR MOUTH--BUT NO WATER, NO FOOD, NO CHEWING GUM, NO MINTS, NO CANDIES, NO CHEWING TOBACCO.  PLEASE TAKE THE FOLLOWING MEDICATIONS THE AM OF YOUR SURGERY WITH A FEW SIPS OF WATER: OXYCODONE    IF YOU USE INHALERS--USE YOUR INHALERS THE AM OF YOUR SURGERY AND BRING INHALERS TO THE HOSPITAL -TAKE TO SURGERY.    IF YOU ARE DIABETIC:  DO NOT TAKE ANY DIABETIC MEDICATIONS THE AM OF YOUR SURGERY.  IF YOU TAKE INSULIN IN THE EVENINGS--PLEASE ONLY TAKE 1/2 NORMAL EVENING DOSE THE NIGHT BEFORE YOUR SURGERY.  NO INSULIN THE AM OF YOUR SURGERY.  IF YOU HAVE SLEEP APNEA AND USE CPAP OR BIPAP--PLEASE BRING THE MASK --NOT THE MACHINE-NOT THE TUBING   -JUST THE MASK. DO NOT BRING VALUABLES, MONEY, CREDIT CARDS.  CONTACT LENS, DENTURES / PARTIALS, GLASSES SHOULD NOT BE WORN TO SURGERY AND IN MOST CASES-HEARING AIDS WILL NEED TO BE REMOVED.  BRING YOUR GLASSES CASE, ANY EQUIPMENT NEEDED FOR YOUR CONTACT LENS. FOR PATIENTS ADMITTED TO THE HOSPITAL--CHECK OUT TIME THE DAY OF DISCHARGE IS 11:00 AM.  ALL INPATIENT ROOMS ARE PRIVATE - WITH BATHROOM, TELEPHONE, TELEVISION AND WIFI INTERNET. IF YOU ARE BEING DISCHARGED THE SAME DAY OF YOUR SURGERY--YOU CAN NOT DRIVE YOURSELF HOME--AND SHOULD NOT GO HOME ALONE BY TAXI OR BUS.  NO DRIVING OR OPERATING MACHINERY FOR 24 HOURS FOLLOWING ANESTHESIA / PAIN MEDICATIONS.                            SPECIAL INSTRUCTIONS:  CHLORHEXIDINE SOAP SHOWER (other brand names are Betasept and Hibiclens ) PLEASE SHOWER WITH CHLORHEXIDINE THE NIGHT BEFORE YOUR SURGERY AND THE AM OF YOUR SURGERY. DO NOT USE CHLORHEXIDINE ON YOUR FACE OR PRIVATE AREAS--YOU MAY USE YOUR  NORMAL SOAP THOSE AREAS AND YOUR NORMAL SHAMPOO.  WOMEN SHOULD AVOID SHAVING UNDER ARMS AND SHAVING LEGS 48 HOURS BEFORE USING CHLORHEXIDINE TO AVOID SKIN IRRITATION.  DO NOT USE IF ALLERGIC TO CHLORHEXIDINE.  PLEASE READ OVER ANY  FACT SHEETS THAT YOU WERE GIVEN: MRSA INFORMATION

## 2011-10-13 NOTE — Pre-Procedure Instructions (Addendum)
ALISHA  AT DR. MARTIN'S OFFICE NOTIFIED PT'S PTT, PT & INR ELEVATED-BUT PT'S LAST COUMADIN WAS YESTERDAY 10/12/11 AND ORDER IS IN EPIC TO REPEAT PT, INR DAY OF SURGERY. BARI BED WAS ALSO ORDERED IN EPIC-PT'S WEIGHT 408LBS.  CHRISTIE WITH PORTABLE EQUIPMENT NOTIFIED PT WILL NEED BARI BED WITH TRAPEZE FOR SURGERY ON 10/19/11.

## 2011-10-14 NOTE — Pre-Procedure Instructions (Signed)
PT CALLED AND CONFIRMED MESSAGE RECEIVED TO START MUPIROCIN OINTMENT.

## 2011-10-15 ENCOUNTER — Encounter (INDEPENDENT_AMBULATORY_CARE_PROVIDER_SITE_OTHER): Payer: Self-pay | Admitting: Surgery

## 2011-10-16 ENCOUNTER — Encounter (INDEPENDENT_AMBULATORY_CARE_PROVIDER_SITE_OTHER): Payer: Self-pay | Admitting: Surgery

## 2011-10-16 ENCOUNTER — Ambulatory Visit (INDEPENDENT_AMBULATORY_CARE_PROVIDER_SITE_OTHER): Payer: Medicare Other | Admitting: Surgery

## 2011-10-16 VITALS — BP 168/84 | HR 72 | Temp 98.0°F | Resp 20 | Ht 77.5 in | Wt >= 6400 oz

## 2011-10-16 DIAGNOSIS — E669 Obesity, unspecified: Secondary | ICD-10-CM

## 2011-10-16 NOTE — Progress Notes (Signed)
Chief Complaint:  Morbid obesity and failed gastroplasty  History of Present Illness:  Kyle [name] is an 56 y.o. male who wants something to try to help him achieve some sustained weight loss following a failed stapling operation done in 1993 done in [place]. Our endoscopy by Dr. Ezzard Standing felt that the pouch was large with breakdown of the staple line he was able to put the scope through both hands of this. There was a small hiatal hernia noted.. It would appear that he underwent a an open gastroplasty.  We've had frank discussions about the Roux-en-Y gastric bypass as well as we discussed gastric sleeve. He has given me permission to perform a sleeve if we are unable to do a gastric bypass. We will have to see how significant his adhesions are at the time of the procedure.  Currently he is taking care of him and he was to be his mentors final arrangements. This man that last weekend and is scheduled to be very this coming Sunday which was one-day preop. Offered to reschedule the surgery but he wants to go head and move forward as this man had wanted him to do this for him.  Past Medical History  Diagnosis Date  . GERD (gastroesophageal reflux disease)   . Shortness of breath   . Arthritis   . Wears dentures   . Atrial fibrillation     HX OF SICK SINUS SYNDROME-ATRIAL FIB  . 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 Peripheral vascular disease     CHRONIC VENOUS INSUFFICIENCY  . Back pain, chronic     PT STATES 5 BULGING DISKS AND PINCHED NERVES--PT ON OXYCODONE 4 TIMES A DAY FOR HIS PAIN    Past Surgical History  Procedure Date  . Gastric restriction surgery 1992  . Leg surgery 1998    calcium deposit removed on right  leg   . Foot surgery 2003    right foot surgery from work accident   . Esophagogastroduodenoscopy 05/18/2011    Procedure: ESOPHAGOGASTRODUODENOSCOPY (EGD);  Surgeon: Kandis Cocking, MD;   Location: [facility];  Service: Endoscopy;  Laterality: N/A;    Current Outpatient Prescriptions  Medication Sig Dispense Refill  . OXYCODONE HCL PO Take by mouth. 30 MG PO EVERY 6 HOURS --VERIFIED BY BARBARA AT [phone].  PT TAKES 4 TIMES A DAY--IT IS NOT PRN       Vioxx Family History  Problem Relation Age of Onset  . Cancer Mother     melanoma  . Heart disease Mother   . Cancer Father     melanoma  . Heart disease Father   . Heart disease Sister    Social History:   reports that he quit smoking about 7 months ago. He has never used smokeless tobacco. He reports that he drinks alcohol. He reports that he does not use illicit drugs.   REVIEW OF SYSTEMS - PERTINENT POSITIVES ONLY: noncontributory  Physical Exam:   Blood pressure 168/84, pulse 72, temperature 98 F (36.7 C), temperature source Temporal, resp. rate 20, height 6' 5.5" (1.969 m), weight 407 lb (184.614 kg). Body mass index is 47.64 kg/(m^2).  Gen:  WDWN WM NAD  Neurological: Alert and oriented to person, place, and time. Motor and sensory function is grossly intact  Head: Normocephalic and atraumatic.  Eyes: Conjunctivae are normal. Pupils are equal, round, and reactive to light. No scleral  icterus.  Neck: Normal range of motion. Neck supple. No tracheal deviation or thyromegaly present.  Cardiovascular:  SR without murmurs or gallops.  No carotid bruits Respiratory: Effort normal.  No respiratory distress. No chest wall tenderness. Breath sounds normal.  No wheezes, rales or rhonchi.  Abdomen:  Obese, nontender GU: Musculoskeletal: Normal range of motion. Extremities are nontender. No cyanosis, edema or clubbing noted Lymphadenopathy: No cervical, preauricular, postauricular or axillary adenopathy is present Skin: Skin is warm and dry. No rash noted. No diaphoresis. No erythema. No pallor. Pscyh: Normal mood and affect. Behavior is normal. Judgment and thought content normal.   LABORATORY  RESULTS: No results found for this or any previous visit (from the past 48 hour(s)).  RADIOLOGY RESULTS: No results found.  Problem List: Patient Active Problem List  Diagnoses  . Obesity    Assessment & Plan: Morbid obesity after failed gastroplasty.  Will proceed with lap roux Y or sleeve gastrectomy as per patient's wishes    Matt B. Daphine Deutscher, MD, [facility] [phone] beeper [phone]  10/16/2011 4:54 PM

## 2011-10-19 ENCOUNTER — Inpatient Hospital Stay (HOSPITAL_COMMUNITY)
Admission: RE | Admit: 2011-10-19 | Discharge: 2011-10-23 | DRG: 620 | Disposition: A | Discharge status: Home / Self Care | Payer: Medicare Other | Source: Ambulatory Visit | Attending: Surgery | Admitting: Surgery

## 2011-10-19 ENCOUNTER — Encounter (HOSPITAL_COMMUNITY)
Admission: RE | Disposition: A | Discharge status: Home / Self Care | Payer: Self-pay | Source: Ambulatory Visit | Attending: Surgery

## 2011-10-19 ENCOUNTER — Encounter (HOSPITAL_COMMUNITY): Payer: Self-pay | Admitting: *Deleted

## 2011-10-19 ENCOUNTER — Encounter (HOSPITAL_COMMUNITY): Payer: Self-pay | Admitting: Anesthesiology

## 2011-10-19 ENCOUNTER — Ambulatory Visit (HOSPITAL_COMMUNITY): Payer: Medicare Other | Admitting: Anesthesiology

## 2011-10-19 DIAGNOSIS — Z6841 Body Mass Index (BMI) 40.0 and over, adult: Secondary | ICD-10-CM

## 2011-10-19 DIAGNOSIS — F40298 Other specified phobia: Secondary | ICD-10-CM | POA: Diagnosis present

## 2011-10-19 DIAGNOSIS — K210 Gastro-esophageal reflux disease with esophagitis: Secondary | ICD-10-CM

## 2011-10-19 DIAGNOSIS — Z5189 Encounter for other specified aftercare: Secondary | ICD-10-CM | POA: Diagnosis not present

## 2011-10-19 DIAGNOSIS — Z79899 Other long term (current) drug therapy: Secondary | ICD-10-CM | POA: Diagnosis not present

## 2011-10-19 DIAGNOSIS — K449 Diaphragmatic hernia without obstruction or gangrene: Secondary | ICD-10-CM | POA: Diagnosis present

## 2011-10-19 DIAGNOSIS — I872 Venous insufficiency (chronic) (peripheral): Secondary | ICD-10-CM | POA: Diagnosis present

## 2011-10-19 DIAGNOSIS — K660 Peritoneal adhesions (postprocedural) (postinfection): Secondary | ICD-10-CM | POA: Diagnosis not present

## 2011-10-19 DIAGNOSIS — M549 Dorsalgia, unspecified: Secondary | ICD-10-CM | POA: Diagnosis present

## 2011-10-19 DIAGNOSIS — Y832 Surgical operation with anastomosis, bypass or graft as the cause of abnormal reaction of the patient, or of later complication, without mention of misadventure at the time of the procedure: Secondary | ICD-10-CM | POA: Diagnosis present

## 2011-10-19 DIAGNOSIS — G4730 Sleep apnea, unspecified: Secondary | ICD-10-CM | POA: Diagnosis present

## 2011-10-19 DIAGNOSIS — K929 Disease of digestive system, unspecified: Secondary | ICD-10-CM | POA: Diagnosis present

## 2011-10-19 DIAGNOSIS — E669 Obesity, unspecified: Secondary | ICD-10-CM

## 2011-10-19 DIAGNOSIS — I4891 Unspecified atrial fibrillation: Secondary | ICD-10-CM | POA: Diagnosis not present

## 2011-10-19 DIAGNOSIS — G8929 Other chronic pain: Secondary | ICD-10-CM | POA: Diagnosis present

## 2011-10-19 DIAGNOSIS — R0602 Shortness of breath: Secondary | ICD-10-CM | POA: Diagnosis not present

## 2011-10-19 DIAGNOSIS — D131 Benign neoplasm of stomach: Secondary | ICD-10-CM | POA: Diagnosis not present

## 2011-10-19 DIAGNOSIS — Z87891 Personal history of nicotine dependence: Secondary | ICD-10-CM | POA: Diagnosis not present

## 2011-10-19 DIAGNOSIS — K219 Gastro-esophageal reflux disease without esophagitis: Secondary | ICD-10-CM | POA: Diagnosis not present

## 2011-10-19 DIAGNOSIS — Z9889 Other specified postprocedural states: Secondary | ICD-10-CM | POA: Diagnosis not present

## 2011-10-19 HISTORY — PX: GASTRIC BYPASS: SHX52

## 2011-10-19 LAB — CBC
HCT: 45.5 % (ref 39.0–52.0)
Hemoglobin: 14.9 g/dL (ref 13.0–17.0)
MCHC: 32.7 g/dL (ref 30.0–36.0)
RBC: 5.29 MIL/uL (ref 4.22–5.81)
WBC: 15.7 10*3/uL — ABNORMAL HIGH (ref 4.0–10.5)

## 2011-10-19 LAB — CREATININE, SERUM
Creatinine, Ser: 0.7 mg/dL (ref 0.50–1.35)
GFR calc Af Amer: >90 mL/min (ref >90)
GFR calc non Af Amer: >90 mL/min (ref >90)

## 2011-10-19 LAB — MRSA PCR SCREENING: MRSA by PCR: NEGATIVE

## 2011-10-19 LAB — HEMOGLOBIN AND HEMATOCRIT, BLOOD: HCT: 44.5 % (ref 39.0–52.0)

## 2011-10-19 SURGERY — REVISION, GASTRIC BYPASS, ROUX-EN-Y, LAPAROSCOPIC
Anesthesia: General | Site: Abdomen | Wound class: Clean Contaminated

## 2011-10-19 MED ORDER — UNJURY VANILLA POWDER
2.0000 [oz_av] | Freq: Four times a day (QID) | ORAL | Status: DC
  Administered 2011-10-21 – 2011-10-22 (×5): 2 [oz_av] via ORAL
  Filled 2011-10-19 (×4): qty 27

## 2011-10-19 MED ORDER — LIDOCAINE HCL (CARDIAC) 20 MG/ML IV SOLN
INTRAVENOUS | Status: DC | PRN
  Administered 2011-10-19: 50 mg via INTRAVENOUS

## 2011-10-19 MED ORDER — UNJURY CHICKEN SOUP POWDER
2.0000 [oz_av] | Freq: Four times a day (QID) | ORAL | Status: DC
  Filled 2011-10-19 (×4): qty 27

## 2011-10-19 MED ORDER — UNJURY CHOCOLATE CLASSIC POWDER
2.0000 [oz_av] | Freq: Four times a day (QID) | ORAL | Status: DC
  Administered 2011-10-21 (×3): 2 [oz_av] via ORAL
  Filled 2011-10-19 (×4): qty 27

## 2011-10-19 MED ORDER — LACTATED RINGERS IV SOLN: INTRAVENOUS | Status: DC

## 2011-10-19 MED ORDER — CEFOXITIN SODIUM-DEXTROSE 1-4 GM-% IV SOLR (PREMIX)
INTRAVENOUS | Status: AC
  Filled 2011-10-19: qty 100

## 2011-10-19 MED ORDER — ACETAMINOPHEN 10 MG/ML IV SOLN
INTRAVENOUS | Status: DC | PRN
  Administered 2011-10-19: 1000 mg via INTRAVENOUS

## 2011-10-19 MED ORDER — ACETAMINOPHEN 160 MG/5ML PO SOLN: 650.0000 mg | ORAL | Status: DC | PRN

## 2011-10-19 MED ORDER — KCL IN DEXTROSE-NACL 20-5-0.45 MEQ/L-%-% IV SOLN
INTRAVENOUS | Status: DC
  Administered 2011-10-20: 1000 mL via INTRAVENOUS
  Administered 2011-10-20: via INTRAVENOUS
  Administered 2011-10-20 – 2011-10-21 (×2): 1000 mL via INTRAVENOUS
  Administered 2011-10-21: 01:00:00 via INTRAVENOUS
  Administered 2011-10-21: 1000 mL via INTRAVENOUS
  Administered 2011-10-22 (×2): via INTRAVENOUS
  Administered 2011-10-22: 125 mL via INTRAVENOUS
  Administered 2011-10-23: 02:00:00 via INTRAVENOUS
  Filled 2011-10-19 (×11): qty 1000

## 2011-10-19 MED ORDER — HYDROMORPHONE HCL PF 1 MG/ML IJ SOLN: 0.2500 mg | INTRAMUSCULAR | Status: DC | PRN

## 2011-10-19 MED ORDER — FENTANYL CITRATE 0.05 MG/ML IJ SOLN
INTRAMUSCULAR | Status: DC | PRN
  Administered 2011-10-19: 100 ug via INTRAVENOUS

## 2011-10-19 MED ORDER — BUPIVACAINE LIPOSOME 1.3 % IJ SUSP
20.0000 mL | Freq: Once | INTRAMUSCULAR | Status: AC
  Administered 2011-10-19: 20 mL
  Filled 2011-10-19: qty 20

## 2011-10-19 MED ORDER — LACTATED RINGERS IR SOLN
Status: DC | PRN
  Administered 2011-10-19: 3000 mL

## 2011-10-19 MED ORDER — SUFENTANIL CITRATE 50 MCG/ML IV SOLN
INTRAVENOUS | Status: DC | PRN
  Administered 2011-10-19 (×4): 10 ug via INTRAVENOUS
  Administered 2011-10-19: 20 ug via INTRAVENOUS
  Administered 2011-10-19: 10 ug via INTRAVENOUS
  Administered 2011-10-19: 5 ug via INTRAVENOUS
  Administered 2011-10-19: 10 ug via INTRAVENOUS
  Administered 2011-10-19: 5 ug via INTRAVENOUS
  Administered 2011-10-19: 10 ug via INTRAVENOUS

## 2011-10-19 MED ORDER — HEPARIN SODIUM (PORCINE) 5000 UNIT/ML IJ SOLN
5000.0000 [IU] | Freq: Three times a day (TID) | INTRAMUSCULAR | Status: DC
  Administered 2011-10-20 – 2011-10-23 (×10): 5000 [IU] via SUBCUTANEOUS
  Filled 2011-10-19 (×14): qty 1

## 2011-10-19 MED ORDER — FENTANYL CITRATE 0.05 MG/ML IJ SOLN
INTRAMUSCULAR | Status: AC
  Filled 2011-10-19: qty 2

## 2011-10-19 MED ORDER — HYDROMORPHONE HCL PF 1 MG/ML IJ SOLN
INTRAMUSCULAR | Status: DC | PRN
  Administered 2011-10-19 (×2): 1 mg via INTRAVENOUS

## 2011-10-19 MED ORDER — MIDAZOLAM HCL 5 MG/5ML IJ SOLN
INTRAMUSCULAR | Status: DC | PRN
  Administered 2011-10-19: 2 mg via INTRAVENOUS

## 2011-10-19 MED ORDER — EPHEDRINE SULFATE 50 MG/ML IJ SOLN
INTRAMUSCULAR | Status: DC | PRN
  Administered 2011-10-19: 5 mg via INTRAVENOUS

## 2011-10-19 MED ORDER — LACTATED RINGERS IV SOLN
INTRAVENOUS | Status: DC
  Administered 2011-10-19: 1000 mL via INTRAVENOUS
  Administered 2011-10-19 (×3): via INTRAVENOUS

## 2011-10-19 MED ORDER — MORPHINE SULFATE 2 MG/ML IJ SOLN
2.0000 mg | INTRAMUSCULAR | Status: DC | PRN
  Administered 2011-10-19: 4 mg via INTRAVENOUS
  Administered 2011-10-19 – 2011-10-20 (×3): 6 mg via INTRAVENOUS
  Filled 2011-10-19: qty 2
  Filled 2011-10-19: qty 3
  Filled 2011-10-19: qty 1
  Filled 2011-10-19 (×2): qty 3

## 2011-10-19 MED ORDER — HEPARIN SODIUM (PORCINE) 5000 UNIT/ML IJ SOLN
5000.0000 [IU] | INTRAMUSCULAR | Status: AC
  Administered 2011-10-19: 5000 [IU] via SUBCUTANEOUS

## 2011-10-19 MED ORDER — CISATRACURIUM BESYLATE 2 MG/ML IV SOLN
INTRAVENOUS | Status: DC | PRN
  Administered 2011-10-19: 4 mg via INTRAVENOUS
  Administered 2011-10-19: 12 mg via INTRAVENOUS

## 2011-10-19 MED ORDER — ONDANSETRON HCL 4 MG/2ML IJ SOLN: 4.0000 mg | INTRAMUSCULAR | Status: DC | PRN

## 2011-10-19 MED ORDER — CISATRACURIUM BESYLATE (PF) 10 MG/5ML IV SOLN
INTRAVENOUS | Status: DC | PRN
  Administered 2011-10-19: 6 mg via INTRAVENOUS
  Administered 2011-10-19: 5 mg via INTRAVENOUS
  Administered 2011-10-19: 4 mg via INTRAVENOUS
  Administered 2011-10-19: 3 mg via INTRAVENOUS

## 2011-10-19 MED ORDER — DEXTROSE 5 % IV SOLN
2.0000 g | INTRAVENOUS | Status: AC
  Administered 2011-10-19: 2 g via INTRAVENOUS
  Filled 2011-10-19: qty 2

## 2011-10-19 MED ORDER — ACETAMINOPHEN 10 MG/ML IV SOLN
INTRAVENOUS | Status: AC
  Filled 2011-10-19: qty 100

## 2011-10-19 MED ORDER — GLYCOPYRROLATE 0.2 MG/ML IJ SOLN
INTRAMUSCULAR | Status: DC | PRN
  Administered 2011-10-19: .6 mg via INTRAVENOUS

## 2011-10-19 MED ORDER — FENTANYL CITRATE 0.05 MG/ML IJ SOLN
50.0000 ug | INTRAMUSCULAR | Status: DC | PRN
  Administered 2011-10-19 (×2): 50 ug via INTRAVENOUS

## 2011-10-19 MED ORDER — OXYCODONE-ACETAMINOPHEN 5-325 MG/5ML PO SOLN
5.0000 mL | ORAL | Status: DC | PRN
  Administered 2011-10-20: 10 mL via ORAL
  Filled 2011-10-19 (×2): qty 10

## 2011-10-19 MED ORDER — SUCCINYLCHOLINE CHLORIDE 20 MG/ML IJ SOLN
INTRAMUSCULAR | Status: DC | PRN
  Administered 2011-10-19: 100 mg via INTRAVENOUS

## 2011-10-19 MED ORDER — HEPARIN SODIUM (PORCINE) 5000 UNIT/ML IJ SOLN
INTRAMUSCULAR | Status: AC
  Filled 2011-10-19: qty 1

## 2011-10-19 MED ORDER — DEXAMETHASONE SODIUM PHOSPHATE 4 MG/ML IJ SOLN
INTRAMUSCULAR | Status: DC | PRN
  Administered 2011-10-19: 10 mg via INTRAVENOUS

## 2011-10-19 MED ORDER — LABETALOL HCL 5 MG/ML IV SOLN
INTRAVENOUS | Status: DC | PRN
  Administered 2011-10-19: 5 mg via INTRAVENOUS

## 2011-10-19 MED ORDER — PROPOFOL 10 MG/ML IV EMUL
INTRAVENOUS | Status: DC | PRN
  Administered 2011-10-19: 250 mg via INTRAVENOUS

## 2011-10-19 MED ORDER — NEOSTIGMINE METHYLSULFATE 1 MG/ML IJ SOLN
INTRAMUSCULAR | Status: DC | PRN
  Administered 2011-10-19: 5 mg via INTRAVENOUS

## 2011-10-19 MED FILL — Cisatracurium Besylate (PF) IV Soln 10 MG/5ML (2 MG/ML): INTRAVENOUS | Qty: 10 | Status: AC

## 2011-10-19 SURGICAL SUPPLY — 83 items
APPLICATOR COTTON TIP 6IN STRL (MISCELLANEOUS) ×6 IMPLANT
BAG LAPAROSCOPIC 12 15 PORT 16 (BASKET) ×2 IMPLANT
BAG RETRIEVAL 12/15 (BASKET) ×3
BENZOIN TINCTURE PRP APPL 2/3 (GAUZE/BANDAGES/DRESSINGS) IMPLANT
BLADE SURG 15 STRL LF DISP TIS (BLADE) ×2 IMPLANT
BLADE SURG 15 STRL SS (BLADE) ×1
CABLE HIGH FREQUENCY MONO STRZ (ELECTRODE) ×3 IMPLANT
CANISTER SUCTION 2500CC (MISCELLANEOUS) ×3 IMPLANT
CLIP SUT LAPRA TY ABSORB (SUTURE) IMPLANT
CLOTH BEACON ORANGE TIMEOUT ST (SAFETY) ×3 IMPLANT
COVER SURGICAL LIGHT HANDLE (MISCELLANEOUS) ×3 IMPLANT
DEVICE SUTURE ENDOST 10MM (ENDOMECHANICALS) ×3 IMPLANT
DEVICE TROCAR PUNCTURE CLOSURE (ENDOMECHANICALS) ×3 IMPLANT
DISSECTOR BLUNT TIP ENDO 5MM (MISCELLANEOUS) IMPLANT
DRAIN CHANNEL 19F RND (DRAIN) ×3 IMPLANT
DRAIN PENROSE 18X1/4 LTX STRL (WOUND CARE) IMPLANT
DRAPE CAMERA CLOSED 9X96 (DRAPES) ×3 IMPLANT
EVACUATOR DRAINAGE 10X20 100CC (DRAIN) ×2 IMPLANT
EVACUATOR SILICONE 100CC (DRAIN) ×1
GAUZE SPONGE 4X4 16PLY XRAY LF (GAUZE/BANDAGES/DRESSINGS) ×3 IMPLANT
GEL PDS (MISCELLANEOUS) ×9 IMPLANT
GLOVE BIOGEL M 8.0 STRL (GLOVE) ×3 IMPLANT
GLOVE BIOGEL PI IND STRL 7.5 (GLOVE) ×4 IMPLANT
GLOVE BIOGEL PI INDICATOR 7.5 (GLOVE) ×2
GLOVE SS BIOGEL STRL SZ 7.5 (GLOVE) ×4 IMPLANT
GLOVE SUPERSENSE BIOGEL SZ 7.5 (GLOVE) ×2
GLOVE SURG SIGNA 7.5 PF LTX (GLOVE) ×9 IMPLANT
GLOVE SURG SS PI 6.5 STRL IVOR (GLOVE) ×3 IMPLANT
GLOVE SURG SS PI 8.5 STRL IVOR (GLOVE) ×2
GLOVE SURG SS PI 8.5 STRL STRW (GLOVE) ×4 IMPLANT
GOWN STRL NON-REIN LRG LVL3 (GOWN DISPOSABLE) ×6 IMPLANT
GOWN STRL REIN XL XLG (GOWN DISPOSABLE) ×12 IMPLANT
HANDLE STAPLE EGIA 4 XL (STAPLE) ×3 IMPLANT
HOVERMATT SINGLE USE (MISCELLANEOUS) ×3 IMPLANT
IV LACTATED RINGER IRRG 3000ML (IV SOLUTION) ×1
IV LR IRRIG 3000ML ARTHROMATIC (IV SOLUTION) ×2 IMPLANT
KIT BASIN OR (CUSTOM PROCEDURE TRAY) ×3 IMPLANT
KIT GASTRIC LAVAGE 34FR ADT (SET/KITS/TRAYS/PACK) ×3 IMPLANT
NEEDLE SPNL 22GX3.5 QUINCKE BK (NEEDLE) ×3 IMPLANT
NS IRRIG 1000ML POUR BTL (IV SOLUTION) ×3 IMPLANT
PACK CARDIOVASCULAR III (CUSTOM PROCEDURE TRAY) ×3 IMPLANT
PEN SKIN MARKING BROAD (MISCELLANEOUS) ×3 IMPLANT
RELOAD EGIA 45 MED/THCK PURPLE (STAPLE) ×9 IMPLANT
RELOAD EGIA 45 TAN VASC (STAPLE) IMPLANT
RELOAD EGIA 60 MED/THCK PURPLE (STAPLE) ×15 IMPLANT
RELOAD EGIA 60 TAN VASC (STAPLE) IMPLANT
RELOAD ENDO STITCH 2.0 (ENDOMECHANICALS) ×9
SCISSORS LAP 5X35 DISP (ENDOMECHANICALS) ×3 IMPLANT
SCISSORS LAP 5X45 EPIX DISP (ENDOMECHANICALS) ×3 IMPLANT
SEALANT SURGICAL APPL DUAL CAN (MISCELLANEOUS) IMPLANT
SET IRRIG TUBING LAPAROSCOPIC (IRRIGATION / IRRIGATOR) ×3 IMPLANT
SHEARS CURVED HARMONIC AC 45CM (MISCELLANEOUS) ×3 IMPLANT
SLEEVE ADV FIXATION 12X100MM (TROCAR) ×6 IMPLANT
SLEEVE ADV FIXATION 5X100MM (TROCAR) ×3 IMPLANT
SLEEVE Z-THREAD 12X100MM (TROCAR) IMPLANT
SLEEVE Z-THREAD 5X100MM (TROCAR) IMPLANT
SOLUTION ANTI FOG 6CC (MISCELLANEOUS) ×3 IMPLANT
SPONGE GAUZE 4X4 12PLY (GAUZE/BANDAGES/DRESSINGS) ×3 IMPLANT
STAPLER VISISTAT 35W (STAPLE) ×3 IMPLANT
STRIP CLOSURE SKIN 1/2X4 (GAUZE/BANDAGES/DRESSINGS) IMPLANT
STRIP PERI DRY VERITAS 45 (STAPLE) ×9 IMPLANT
STRIP PERI DRY VERITAS 60 (STAPLE) ×15 IMPLANT
SUT ETHILON 2 0 PS N (SUTURE) ×3 IMPLANT
SUT RELOAD ENDO STITCH 2 48X1 (ENDOMECHANICALS) ×10
SUT RELOAD ENDO STITCH 2.0 (ENDOMECHANICALS) ×8
SUT VIC AB 2-0 SH 27 (SUTURE)
SUT VIC AB 2-0 SH 27X BRD (SUTURE) IMPLANT
SUT VIC AB 4-0 SH 18 (SUTURE) ×3 IMPLANT
SUT VICRYL 0 UR6 27IN ABS (SUTURE) ×3 IMPLANT
SUTURE RELOAD END STTCH 2 48X1 (ENDOMECHANICALS) ×10 IMPLANT
SUTURE RELOAD ENDO STITCH 2.0 (ENDOMECHANICALS) ×8 IMPLANT
SYR 20CC LL (SYRINGE) ×3 IMPLANT
SYR 30ML LL (SYRINGE) ×3 IMPLANT
SYR 50ML LL SCALE MARK (SYRINGE) ×3 IMPLANT
TRAY FOLEY CATH 14FRSI W/METER (CATHETERS) ×3 IMPLANT
TROCAR ADV FIXATION 12X100MM (TROCAR) ×3 IMPLANT
TROCAR XCEL 12X100 BLDLESS (ENDOMECHANICALS) ×3 IMPLANT
TROCAR Z-THREAD FIOS 12X100MM (TROCAR) ×3 IMPLANT
TROCAR Z-THREAD FIOS 5X100MM (TROCAR) ×3 IMPLANT
TUBING CONNECTING 10 (TUBING) ×3 IMPLANT
TUBING ENDO SMARTCAP (MISCELLANEOUS) ×3 IMPLANT
TUBING FILTER THERMOFLATOR (ELECTROSURGICAL) ×3 IMPLANT
WATER STERILE IRR 1500ML POUR (IV SOLUTION) ×3 IMPLANT

## 2011-10-19 NOTE — Transfer of Care (Signed)
Immediate Anesthesia Transfer of Care Note  Patient: Jamonta Goerner  Procedure(s) Performed: Procedure(s) (LRB): LAPAROSCOPIC REVISION OF ROUXENY WITH UPPER ENDOSCOPY (N/A) LAPAROSCOPIC ROUX-EN-Y GASTRIC (N/A)  Patient Location: PACU  Anesthesia Type: General  Level of Consciousness: awake and alert   Airway & Oxygen Therapy: Patient Spontanous Breathing and Patient connected to face mask oxygen  Post-op Assessment: Report given to PACU RN and Post -op Vital signs reviewed and stable  Post vital signs: Reviewed and stable  Complications: No apparent anesthesia complications

## 2011-10-19 NOTE — Brief Op Note (Signed)
10/19/2011  5:07 PM  PATIENT:  Kyle [name]  56 y.o. male  PRE-OPERATIVE DIAGNOSIS:                                                                        morbid obesity,revision   POST-OPERATIVE DIAGNOSIS:  morbid obesity, revision     PROCEDURE:  Procedure(s) (LRB): LAPAROSCOPIC REVISION OF ROUXENY WITH UPPER ENDOSCOPY (N/A) LAPAROSCOPIC ROUX-EN-Y GASTRIC (N/A)  SURGEON:  Surgeon(s) and Role:    * Kandis Cocking, MD - Assisting    * Valarie Merino, MD - Primary  PHYSICIAN ASSISTANT:   ASSISTANTS: Ovidio Kin, MD, FACS   ANESTHESIA:   general  EBL:  Total I/O In: 2000 [I.V.:2000] Out: 275 [Urine:275]  BLOOD ADMINISTERED:none  DRAINS: (19) Jackson-Pratt drain(s) with closed bulb suction in the left upper quadrant   LOCAL MEDICATIONS USED:  MARCAINE     SPECIMEN:  Source of Specimen:  stomach  DISPOSITION OF SPECIMEN:  PATHOLOGY  COUNTS:  YES  TOURNIQUET:  * No tourniquets in log *  DICTATION: .Other Dictation: Dictation Number [id number]  PLAN OF CARE: Admit to inpatient   PATIENT DISPOSITION:  PACU - hemodynamically stable.   Delay start of Pharmacological VTE agent (>24hrs) due to surgical blood loss or risk of bleeding: no

## 2011-10-19 NOTE — Progress Notes (Signed)
[facility] on call MD Dr. Gerrit Friends notified and made aware of JP drainage of greater than 300 in less than 1 hr after arrival to the unit. Orders received; will continue to monitor.

## 2011-10-19 NOTE — Anesthesia Preprocedure Evaluation (Addendum)
Anesthesia Evaluation  Patient identified by MRN, date of birth, ID band Patient awake    Reviewed: Allergy & Precautions, H&P , NPO status , Patient's Chart, lab work & pertinent test results  Airway Mallampati: III TM Distance: >3 FB Neck ROM: full    Dental  (+) Edentulous Upper, Edentulous Lower and Dental Advisory Given   Pulmonary neg pulmonary ROS, shortness of breath and with exertion, sleep apnea , COPDformer smoker breath sounds clear to auscultation  Pulmonary exam normal       Cardiovascular Exercise Tolerance: Good negative cardio ROS  + dysrhythmias Atrial Fibrillation Rhythm:Irregular Rate:Normal     Neuro/Psych Chronic pain with narcotic dependence due to back problems negative neurological ROS  negative psych ROS   GI/Hepatic negative GI ROS, Neg liver ROS,   Endo/Other  negative endocrine ROSMorbid obesity  Renal/GU negative Renal ROS  negative genitourinary   Musculoskeletal   Abdominal   Peds  Hematology negative hematology ROS (+)   Anesthesia Other Findings   Reproductive/Obstetrics negative OB ROS                          Anesthesia Physical Anesthesia Plan  ASA: III  Anesthesia Plan: General   Post-op Pain Management:    Induction: Intravenous  Airway Management Planned: Oral ETT  Additional Equipment:   Intra-op Plan:   Post-operative Plan: Extubation in OR  Informed Consent: I have reviewed the patients History and Physical, chart, labs and discussed the procedure including the risks, benefits and alternatives for the proposed anesthesia with the patient or authorized representative who has indicated his/her understanding and acceptance.   Dental Advisory Given  Plan Discussed with: CRNA and Surgeon  Anesthesia Plan Comments:         Anesthesia Quick Evaluation

## 2011-10-19 NOTE — Anesthesia Procedure Notes (Addendum)
Procedure Name: Intubation Date/Time: 10/19/2011 12:34 PM Performed by: Leroy Libman L Patient Re-evaluated:Patient Re-evaluated prior to inductionOxygen Delivery Method: Circle system utilized Preoxygenation: Pre-oxygenation with 100% oxygen Intubation Type: IV induction Ventilation: Mask ventilation without difficulty and Oral airway inserted - appropriate to patient size Laryngoscope Size: Miller and 3 Grade View: Grade I Tube type: Oral Tube size: 8.0 mm Number of attempts: 1 Airway Equipment and Method: Patient positioned with wedge pillow and Stylet Placement Confirmation: ETT inserted through vocal cords under direct vision,  breath sounds checked- equal and bilateral and positive ETCO2 Secured at: 22 cm Tube secured with: Tape Dental Injury: Teeth and Oropharynx as per pre-operative assessment    Date/Time: 10/19/2011 12:34 PM Performed by: Lurlean Leyden, Myrth Dahan L Airway Equipment and Method: LTA kit utilized

## 2011-10-19 NOTE — Preoperative (Signed)
Beta Blockers   Reason not to administer Beta Blockers:Not Applicable 

## 2011-10-19 NOTE — H&P (Signed)
Chief Complaint: Morbid obesity and failed gastroplasty  History of Present Illness: Kyle [name] is an 56 y.o. male who wants something to try to help him achieve some sustained weight loss following a failed stapling operation done in 1993 done in [place]. Our endoscopy by Dr. Ezzard Standing felt that the pouch was large with breakdown of the staple line he was able to put the scope through both hands of this. There was a small hiatal hernia noted.. It would appear that he underwent a an open gastroplasty.  We've had frank discussions about the Roux-en-Y gastric bypass as well as we discussed gastric sleeve. He has given me permission to perform a sleeve if we are unable to do a gastric bypass. We will have to see how significant his adhesions are at the time of the procedure.  Currently he is taking care of him and he was to be his mentors final arrangements. This man that last weekend and is scheduled to be very this coming Sunday which was one-day preop. Offered to reschedule the surgery but he wants to go head and move forward as this man had wanted him to do this for him.  Past Medical History   Diagnosis  Date   .  GERD (gastroesophageal reflux disease)    .  Shortness of breath    .  Arthritis    .  Wears dentures    .  Atrial fibrillation      HX OF SICK SINUS SYNDROME-ATRIAL FIB   .  Sleep apnea      UNABLE TO TOLERATE CPAP MASK BECAUSE OF CLAUSTROPHOBIA AND DOES NOT HAVE MASK OR MACHINE AT HOME   .  Peripheral vascular disease      CHRONIC VENOUS INSUFFICIENCY   .  Back pain, chronic      PT STATES 5 BULGING DISKS AND PINCHED NERVES--PT ON OXYCODONE 4 TIMES A DAY FOR HIS PAIN    Past Surgical History   Procedure  Date   .  Gastric restriction surgery  1992   .  Leg surgery  1998     calcium deposit removed on right leg   .  Foot surgery  2003     right foot surgery from work accident   .  Esophagogastroduodenoscopy  05/18/2011     Procedure: ESOPHAGOGASTRODUODENOSCOPY (EGD); Surgeon:  Kandis Cocking, MD; Location: [facility]; Service: Endoscopy; Laterality: N/A;    Current Outpatient Prescriptions   Medication  Sig  Dispense  Refill   .  OXYCODONE HCL PO  Take by mouth. 30 MG PO EVERY 6 HOURS --VERIFIED BY [facility] AT [phone]. PT TAKES 4 TIMES A DAY--IT IS NOT PRN     Vioxx  Family History   Problem  Relation  Age of Onset   .  Cancer  Mother       melanoma    .  Heart disease  Mother    .  Cancer  Father       melanoma    .  Heart disease  Father    .  Heart disease  Sister    Social History: reports that he quit smoking about 7 months ago. He has never used smokeless tobacco. He reports that he drinks alcohol. He reports that he does not use illicit drugs.  REVIEW OF SYSTEMS - PERTINENT POSITIVES ONLY:  noncontributory  Physical Exam:  Blood pressure 168/84, pulse 72, temperature 98 F (36.7 C), temperature source Temporal, resp. rate 20, height 6' 5.5" (  1.969 m), weight 407 lb (184.614 kg).  Body mass index is 47.64 kg/(m^2).  Gen: WDWN WM NAD  Neurological: Alert and oriented to person, place, and time. Motor and sensory function is grossly intact  Head: Normocephalic and atraumatic.  Eyes: Conjunctivae are normal. Pupils are equal, round, and reactive to light. No scleral icterus.  Neck: Normal range of motion. Neck supple. No tracheal deviation or thyromegaly present.  Cardiovascular: SR without murmurs or gallops. No carotid bruits  Respiratory: Effort normal. No respiratory distress. No chest wall tenderness. Breath sounds normal. No wheezes, rales or rhonchi.  Abdomen: Obese, nontender  GU:  Musculoskeletal: Normal range of motion. Extremities are nontender. No cyanosis, edema or clubbing noted Lymphadenopathy: No cervical, preauricular, postauricular or axillary adenopathy is present Skin: Skin is warm and dry. No rash noted. No diaphoresis. No erythema. No pallor. Pscyh: Normal mood and affect. Behavior is normal. Judgment and thought  content normal.  LABORATORY RESULTS:  No results found for this or any previous visit (from the past 48 hour(s)).  RADIOLOGY RESULTS:  No results found.  Problem List:  Patient Active Problem List   Diagnoses   .  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 Will proceed with lap roux Y or sleeve gastrectomy as per patient's wishes  Matt B. Daphine Deutscher, MD, [facility]  [phone] beeper  [phone] There has been no change in the patient's past medical history or physical exam in the past 24 hours to the best of my knowledge. I examined the patient in the holding area and have made any changes to the history and physical exam report that is included above.   Expectations and outcome results have been discussed with the patient to include risks and benefits.  All questions have been answered and we will proceed with previously discussed procedure noted and signed in the consent form in the patient's record.    Cailee Blanke BMD FACS 11:46 AM  10/19/2011

## 2011-10-19 NOTE — Anesthesia Postprocedure Evaluation (Signed)
  Anesthesia Post-op Note  Patient: Kyle [name]  Procedure(s) Performed: Procedure(s) (LRB): LAPAROSCOPIC REVISION OF ROUXENY WITH UPPER ENDOSCOPY (N/A) LAPAROSCOPIC PARTIAL GASTRECTOMY (N/A)  Patient Location: PACU  Anesthesia Type: General  Level of Consciousness: awake, alert , oriented and patient cooperative  Airway and Oxygen Therapy: Patient Spontanous Breathing and Patient connected to nasal cannula oxygen  Post-op Pain: none  Post-op Assessment: Post-op Vital signs reviewed, Patient's Cardiovascular Status Stable, Respiratory Function Stable, Patent Airway and No signs of Nausea or vomiting  Post-op Vital Signs: Reviewed and stable  Complications: No apparent anesthesia complications

## 2011-10-19 NOTE — Progress Notes (Signed)
Pt did do bowel prep 10/18/11  With good results. He ate a ham biscuit at 1000 but no other solid food after that. Dr. Daphine Deutscher notified.

## 2011-10-20 ENCOUNTER — Inpatient Hospital Stay (HOSPITAL_COMMUNITY): Payer: Medicare Other

## 2011-10-20 DIAGNOSIS — Z9889 Other specified postprocedural states: Secondary | ICD-10-CM

## 2011-10-20 LAB — PROTIME-INR
INR: 1.04 (ref 0.00–1.49)
Prothrombin Time: 13.8 seconds (ref 11.6–15.2)

## 2011-10-20 LAB — CBC
HCT: 42.9 % (ref 39.0–52.0)
Hemoglobin: 14.9 g/dL (ref 13.0–17.0)
MCH: 28.7 pg (ref 26.0–34.0)
MCHC: 32.4 g/dL (ref 30.0–36.0)
Platelets: 255 10*3/uL (ref 150–400)
RBC: 5.19 MIL/uL (ref 4.22–5.81)
RDW: 16.1 % — ABNORMAL HIGH (ref 11.5–15.5)
WBC: 12 10*3/uL — ABNORMAL HIGH (ref 4.0–10.5)
WBC: 14.5 10*3/uL — ABNORMAL HIGH (ref 4.0–10.5)

## 2011-10-20 LAB — DIFFERENTIAL
Basophils Absolute: 0 10*3/uL (ref 0.0–0.1)
Basophils Relative: 0 % (ref 0–1)
Lymphocytes Relative: 6 % — ABNORMAL LOW (ref 12–46)
Monocytes Absolute: 0.6 10*3/uL (ref 0.1–1.0)
Neutro Abs: 10.7 10*3/uL — ABNORMAL HIGH (ref 1.7–7.7)
Neutrophils Relative %: 89 % — ABNORMAL HIGH (ref 43–77)

## 2011-10-20 LAB — APTT: aPTT: 36 seconds (ref 24–37)

## 2011-10-20 LAB — HEMOGLOBIN AND HEMATOCRIT, BLOOD: Hemoglobin: 13.7 g/dL (ref 13.0–17.0)

## 2011-10-20 IMAGING — CR DG UGI W/ GASTROGRAFIN
4 of 6 series · 15 of 24 positions shown · non-contrast
Comparison: Upper GI [DATE]

CLINICAL DATA: Postop day #1 sleeve gastrectomy.

UPPER GI SERIES WITH KUB
TECHNIQUE: Routine upper GI series was performed with 50 ml
[XE] orally
Fluoroscopy Time: 1.6 minutes

[Series 1: run · 12 of 31 slices shown (1 of 3)]
[im 1/31]
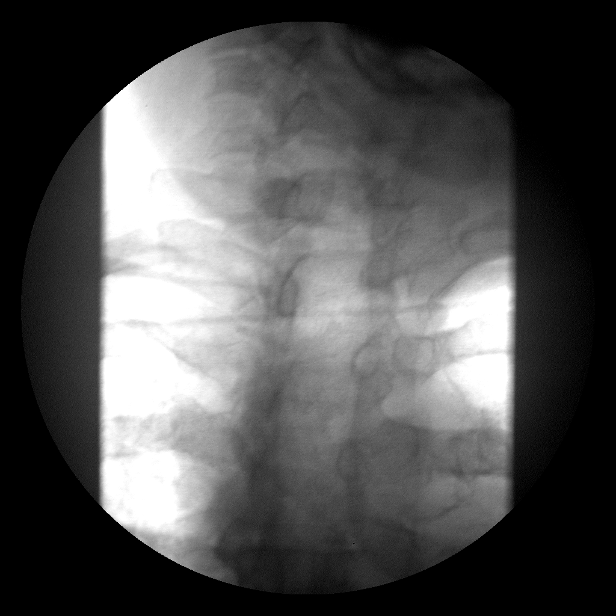
[im 4/31]
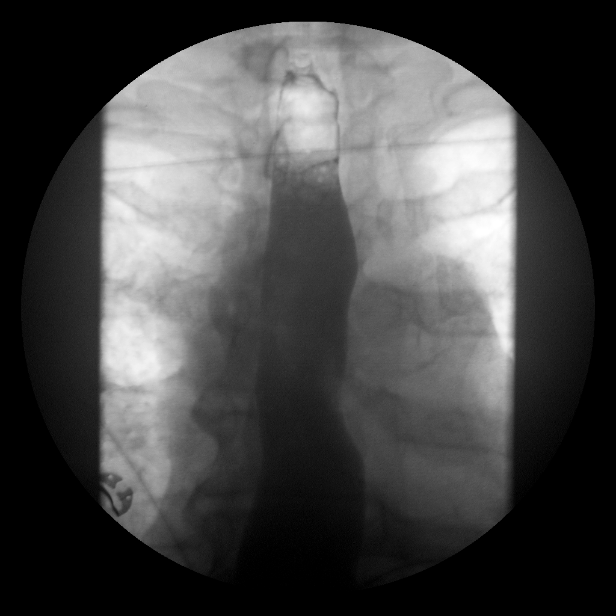
[im 7/31]
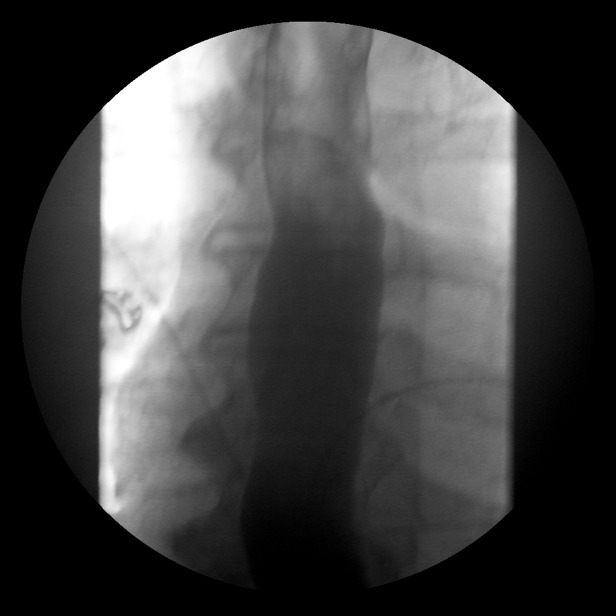
[im 9/31]
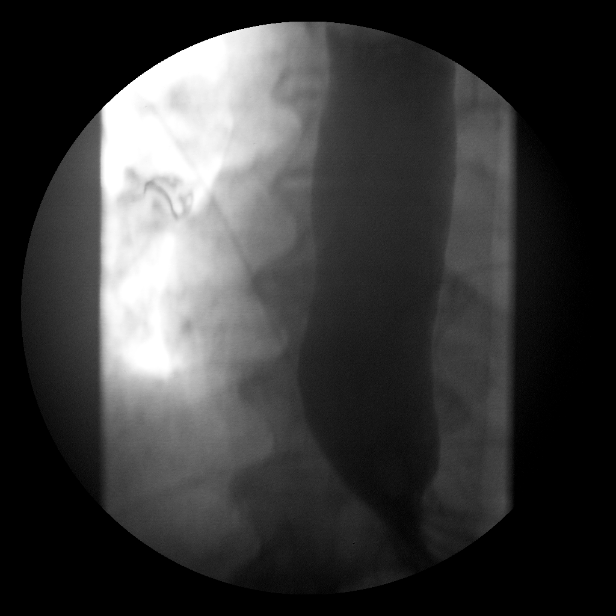
[im 12/31]
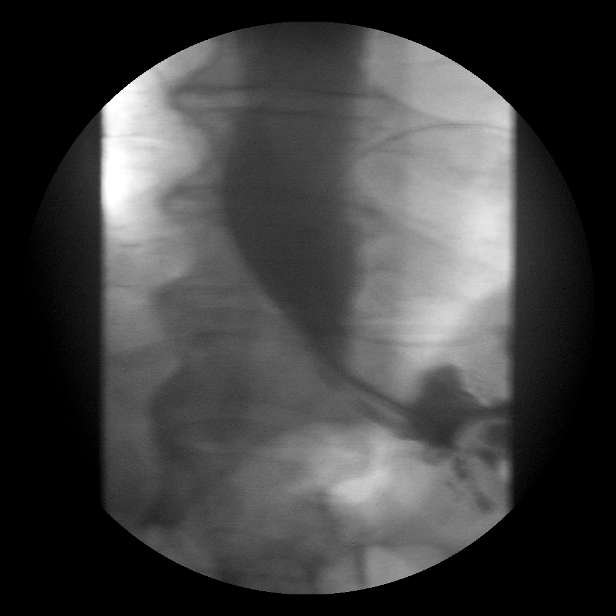
[im 14/31]
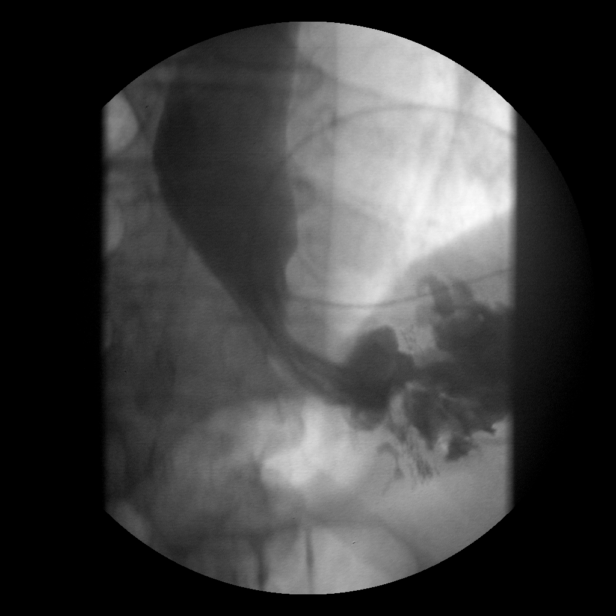
[im 17/31]
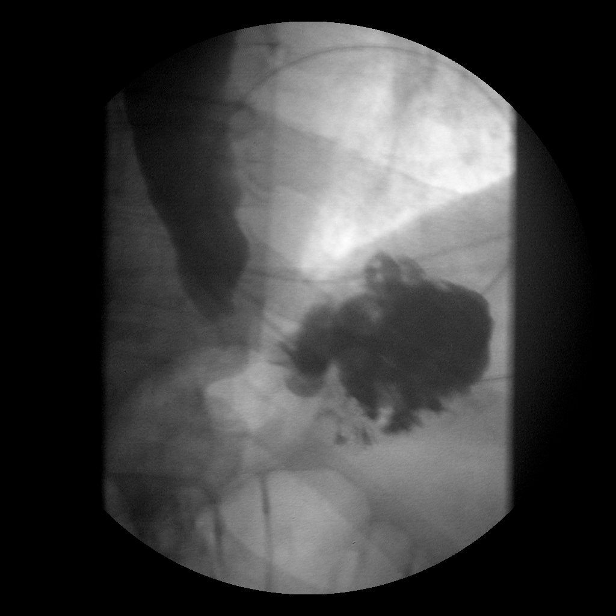
[im 21/31]
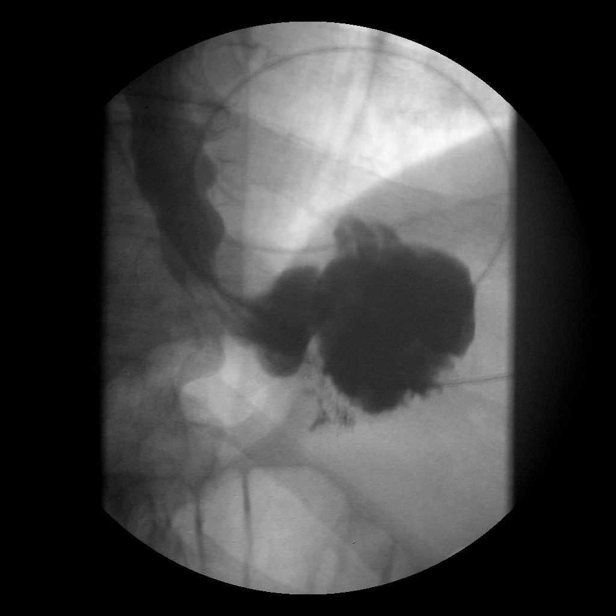
[im 22/31]
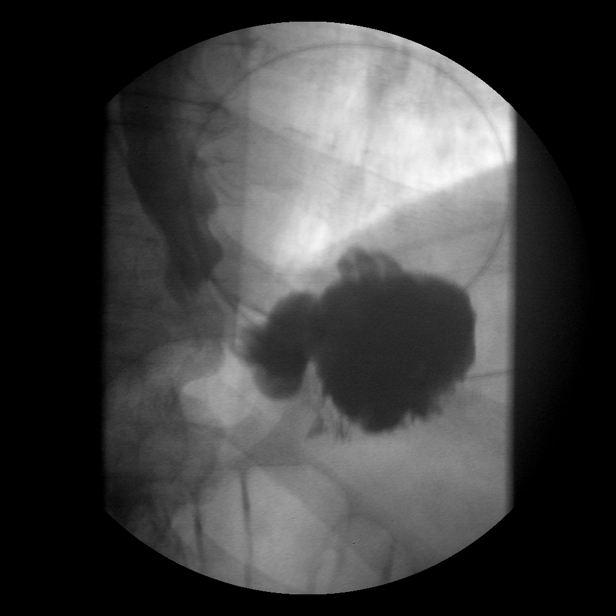
[im 26/31]
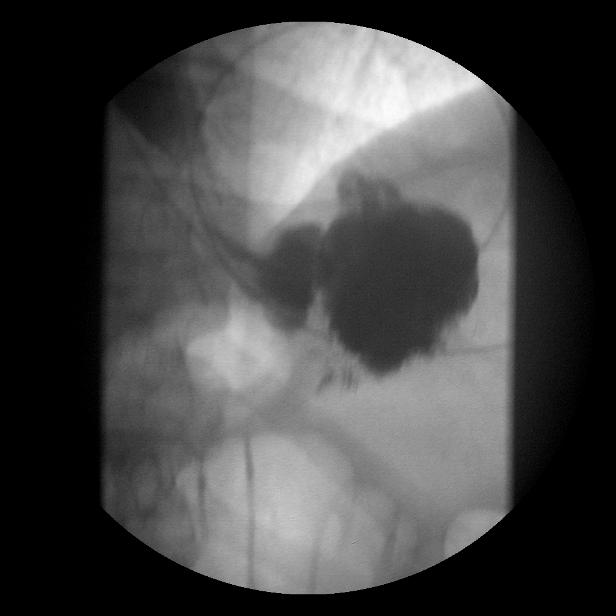
[im 27/31]
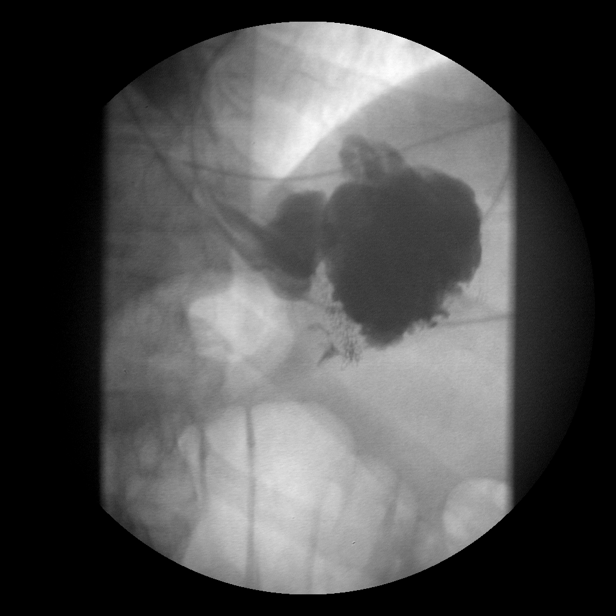
[im 31/31]
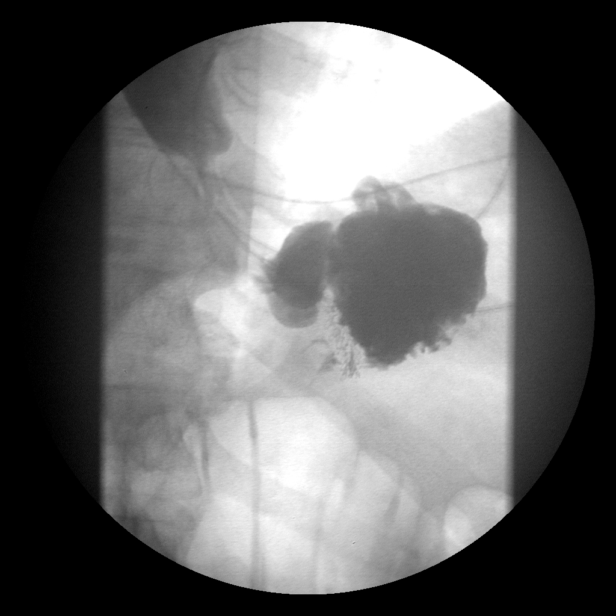

[run (2 of 3)]
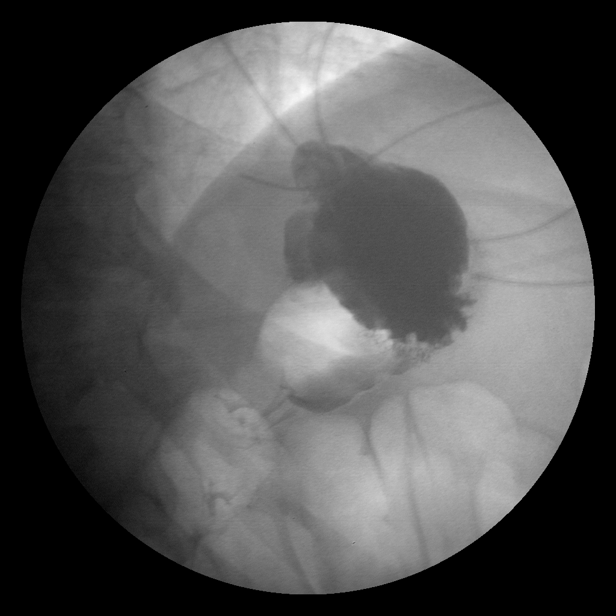

[run (3 of 3)]
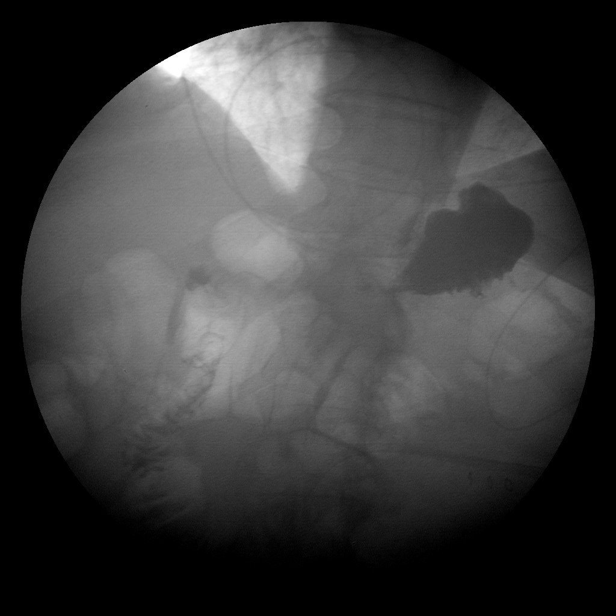

[view not recorded]
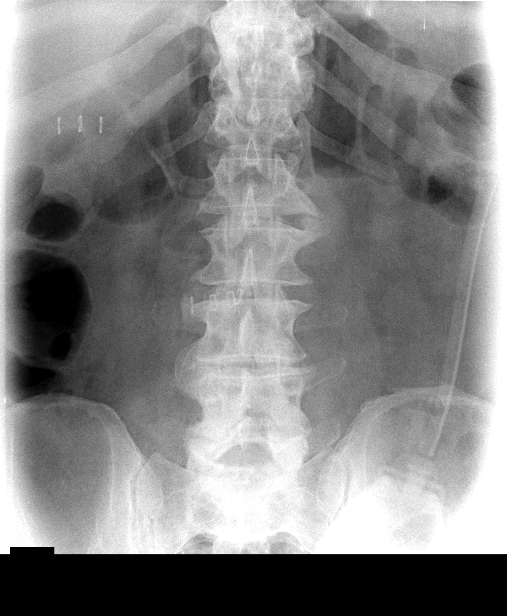

[15 of 24 positions shown; findings below may reference images not displayed]

FINDINGS: Preliminary image of the abdomen reveals surgical drain
present in the epigastric region,  negative for bowel obstruction.
Mild dilatation of the colon compatible with ileus.

Limited anatomical evaluation due to a small amount of contrast and
morbid obesity.

Esophageal motility is satisfactory.  The esophagus is diffusely
dilated without obstruction.  Contrast enters the stomach.  There
has  been partial resection of the stomach.  Contrast fills the
gastric antrum with emptying into the duodenum.  No obstruction or
leak.
IMPRESSION: Negative for obstruction or leak.  Limited anatomical evaluation.

## 2011-10-20 MED ORDER — HYDROMORPHONE HCL PF 1 MG/ML IJ SOLN
1.0000 mg | INTRAMUSCULAR | Status: DC | PRN
  Administered 2011-10-20 (×3): 1 mg via INTRAVENOUS
  Administered 2011-10-20: 2 mg via INTRAVENOUS
  Administered 2011-10-20 (×5): 1 mg via INTRAVENOUS
  Administered 2011-10-21: 2 mg via INTRAVENOUS
  Administered 2011-10-21 (×5): 1 mg via INTRAVENOUS
  Administered 2011-10-21: 2 mg via INTRAVENOUS
  Administered 2011-10-21 (×2): 1 mg via INTRAVENOUS
  Administered 2011-10-21: 2 mg via INTRAVENOUS
  Administered 2011-10-21: 1 mg via INTRAVENOUS
  Administered 2011-10-22 (×2): 2 mg via INTRAVENOUS
  Administered 2011-10-22: 1 mg via INTRAVENOUS
  Administered 2011-10-22 (×2): 2 mg via INTRAVENOUS
  Administered 2011-10-22 – 2011-10-23 (×3): 1 mg via INTRAVENOUS
  Administered 2011-10-23: 2 mg via INTRAVENOUS
  Filled 2011-10-20 (×5): qty 1
  Filled 2011-10-20: qty 2
  Filled 2011-10-20 (×2): qty 1
  Filled 2011-10-20: qty 2
  Filled 2011-10-20 (×2): qty 1
  Filled 2011-10-20: qty 2
  Filled 2011-10-20 (×4): qty 1
  Filled 2011-10-20: qty 2
  Filled 2011-10-20 (×2): qty 1
  Filled 2011-10-20 (×2): qty 2
  Filled 2011-10-20: qty 1
  Filled 2011-10-20: qty 2
  Filled 2011-10-20 (×4): qty 1
  Filled 2011-10-20 (×2): qty 2
  Filled 2011-10-20: qty 1

## 2011-10-20 MED ORDER — IOHEXOL 300 MG/ML  SOLN
50.0000 mL | Freq: Once | INTRAMUSCULAR | Status: AC | PRN
  Administered 2011-10-20: 50 mL via ORAL

## 2011-10-20 NOTE — Op Note (Signed)
NAMESARGON, SCOUTEN              ACCOUNT NO.:  [id number]  MEDICAL RECORD NO.:  [id number]  LOCATION:  1230                         FACILITY:  [facility]  PHYSICIAN:  Thornton Park. Daphine Deutscher, MD  DATE OF BIRTH:  1956-01-14  DATE OF PROCEDURE:  10/19/2011 DATE OF DISCHARGE:                              OPERATIVE REPORT   PREOPERATIVE DIAGNOSIS:  Failed gastroplasty from the early 1990s done in [place].  POSTOPERATIVE DIAGNOSES:  Failed gastroplasty from the early 1990s done in [place], status post laparoscopy with 3-hour enterolysis and mobilization of the stomach, upper endoscopy per Dr. Ezzard Standing to delineate anatomy and assist with planning of sleeve gastrectomy.  SURGEON:  Thornton Park. Daphine Deutscher, MD  ASSISTANT:  Sandria Bales. Ezzard Standing, M.D.  ANESTHESIA:  General endotracheal.  DESCRIPTION OF PROCEDURE:  Mr. Tolen was taken to room 1. Preoperatively, informed consent was obtained regarding the possible likelihood and its outcomes of this operation.  The patient realizes there is a chance we could not do the operation to could possibly do a gastric bypass, but if unable to do that, we would consider proceeding with sleeve gastrectomy.  Informed consent was obtained both in the office preoperatively as well as discussed this in the holding area. After general anesthesia was administered, the abdomen was prepped with PCMX and draped sterilely.  Time-out was performed.  The abdomen was entered through the left upper quadrant using a 0-degree 11 to 12-mm Optiview without difficulty.  The abdomen was insufflated.  He had numerous adhesions in his midline incision.  These were taken down with sharp dissection with Harmonic Scalpel.  These were omental adhesions, and there were densely adherent.  I then worked my way upward and over time and then gradually, I was able to have more trocars.  His liver was stuck up anteriorly, so I did not have to use a Nathanson retractor, but did have to take his colon  down from the liver.  After several hours of dissection, we were able to feel like we have had the stomach exposed. We were concerned that I could not find the lesser curvature.  An adequate space to do a gastric pouch.  He fell back to do the gastric sleeve as this was very more doable and we could restrict him as well as remove some of the splenic mass, which was quite marked.  With the endoscope in place, were able to determine this and with this freed up, we elected to go down near his antrum where we began applications of the Covidien Endo-GIA using approximately 6-cm stapler and three 4.5 mm staplers, hold with Peri-Strips.  We removed the linear greater curvature sleeve, but had to leave a little margin because we retroflexed, you could see that he had like a U-pouch where he had the staples.  Again, we insufflated and looked at things and the sleeve looked good.  No bubbles were seen.  Because of the extensive dissection, we had placed a Blake drain up in the upper abdomen, brought out through the left side, secured it to the skin with nylon.  Wounds were injected with Marcaine.  We removed the specimen through the right lower trocar site. I repaired that with  an Endoclose.  The 4-0 Vicryl was used in the subcutaneous tissue and staples were used on the skin.  The patient tolerated the procedure well and was taken to the recovery room in satisfactory condition.     Thornton Park Daphine Deutscher, MD     MBM/MEDQ  D:  10/19/2011  T:  10/20/2011  Job:  [id number]

## 2011-10-20 NOTE — Progress Notes (Signed)
Patient ID: Kyle [name], male   DOB: 10/08/1955, 56 y.o.   MRN: [id number] [facility] Progress Note:   1 Day Post-Op  Subjective: Mental status is clear.  Explained what we did yesterday.  He understands Objective: Vital signs in last 24 hours: Temp:  [97.5 F (36.4 C)-98.5 F (36.9 C)] 97.5 F (36.4 C) (05/07 0400) Pulse Rate:  [69-114] 104  (05/07 0100) Resp:  [10-18] 10  (05/07 0100) BP: (129-159)/(64-126) 129/64 mmHg (05/07 0100) SpO2:  [94 %-100 %] 97 % (05/07 0100) Weight:  [399 lb 8 oz (181.212 kg)-426 lb 9.4 oz (193.5 kg)] 426 lb 9.4 oz (193.5 kg) (05/06 1835)  Intake/Output from previous day: 05/06 0701 - 05/07 0700 In: 4533.3 [I.V.:4533.3] Out: 1975 [Urine:1325; Drains:650] Intake/Output this shift:    Physical Exam: Work of breathing is  Not labored.  JP in place.  Sore  Lab Results:  Results for orders placed during the hospital encounter of 10/19/11 (from the past 48 hour(s))  PROTIME-INR     Status: Normal   Collection Time   10/19/11  9:45 AM      Component Value Range Comment   Prothrombin Time 13.2  11.6 - 15.2 (seconds)    INR 0.98  0.00 - 1.49    HEMOGLOBIN AND HEMATOCRIT, BLOOD     Status: Normal   Collection Time   10/19/11  5:55 PM      Component Value Range Comment   Hemoglobin 14.8  13.0 - 17.0 (g/dL)    HCT 78.2  95.6 - 21.3 (%)   CBC     Status: Abnormal   Collection Time   10/19/11  7:20 PM      Component Value Range Comment   WBC 15.7 (*) 4.0 - 10.5 (K/uL)    RBC 5.29  4.22 - 5.81 (MIL/uL)    Hemoglobin 14.9  13.0 - 17.0 (g/dL)    HCT 08.6  57.8 - 46.9 (%)    MCV 86.0  78.0 - 100.0 (fL)    MCH 28.2  26.0 - 34.0 (pg)    MCHC 32.7  30.0 - 36.0 (g/dL)    RDW 62.9 (*) 52.8 - 15.5 (%)    Platelets 241  150 - 400 (K/uL)   CREATININE, SERUM     Status: Normal   Collection Time   10/19/11  7:20 PM      Component Value Range Comment   Creatinine, Ser 0.70  0.50 - 1.35 (mg/dL)    GFR calc non Af Amer >90  >90 (mL/min)    GFR calc Af  Amer >90  >90 (mL/min)   MRSA PCR SCREENING     Status: Normal   Collection Time   10/19/11  8:23 PM      Component Value Range Comment   MRSA by PCR NEGATIVE  NEGATIVE    CBC     Status: Abnormal   Collection Time   10/19/11 11:55 PM      Component Value Range Comment   WBC 14.5 (*) 4.0 - 10.5 (K/uL)    RBC 5.19  4.22 - 5.81 (MIL/uL)    Hemoglobin 14.9  13.0 - 17.0 (g/dL)    HCT 41.3  24.4 - 01.0 (%)    MCV 86.5  78.0 - 100.0 (fL)    MCH 28.7  26.0 - 34.0 (pg)    MCHC 33.2  30.0 - 36.0 (g/dL)    RDW 27.2 (*) 53.6 - 15.5 (%)    Platelets 255  150 -  400 (K/uL)   APTT     Status: Normal   Collection Time   10/19/11 11:55 PM      Component Value Range Comment   aPTT 36  24 - 37 (seconds)   PROTIME-INR     Status: Normal   Collection Time   10/19/11 11:55 PM      Component Value Range Comment   Prothrombin Time 13.8  11.6 - 15.2 (seconds)    INR 1.04  0.00 - 1.49    CBC     Status: Abnormal   Collection Time   10/20/11  5:35 AM      Component Value Range Comment   WBC 12.0 (*) 4.0 - 10.5 (K/uL)    RBC 4.95  4.22 - 5.81 (MIL/uL)    Hemoglobin 13.9  13.0 - 17.0 (g/dL)    HCT 16.1  09.6 - 04.5 (%)    MCV 86.7  78.0 - 100.0 (fL)    MCH 28.1  26.0 - 34.0 (pg)    MCHC 32.4  30.0 - 36.0 (g/dL)    RDW 40.9 (*) 81.1 - 15.5 (%)    Platelets 211  150 - 400 (K/uL)   DIFFERENTIAL     Status: Abnormal   Collection Time   10/20/11  5:35 AM      Component Value Range Comment   Neutrophils Relative 89 (*) 43 - 77 (%)    Neutro Abs 10.7 (*) 1.7 - 7.7 (K/uL)    Lymphocytes Relative 6 (*) 12 - 46 (%)    Lymphs Abs 0.7  0.7 - 4.0 (K/uL)    Monocytes Relative 5  3 - 12 (%)    Monocytes Absolute 0.6  0.1 - 1.0 (K/uL)    Eosinophils Relative 0  0 - 5 (%)    Eosinophils Absolute 0.0  0.0 - 0.7 (K/uL)    Basophils Relative 0  0 - 1 (%)    Basophils Absolute 0.0  0.0 - 0.1 (K/uL)     Radiology/Results: No results found.  Anti-infectives: Anti-infectives     Start     Dose/Rate Route Frequency  Ordered Stop   10/19/11 0926   cefOXitin (MEFOXIN) 2 g in dextrose 5 % 50 mL IVPB        2 g 100 mL/hr over 30 Minutes Intravenous 60 min pre-op 10/19/11 9147 10/19/11 1240          Assessment/Plan: Problem List: Patient Active Problem List  Diagnoses  . Obesity    Awaiting swallow.  Will DC Foley and keep in stepdown for now.  1 Day Post-Op    LOS: 1 day   Matt B. Daphine Deutscher, MD, [facility] [phone] beeper [phone]  10/20/2011 7:34 AM

## 2011-10-20 NOTE — Progress Notes (Signed)
Dr. Daphine Deutscher notified of swallow study results as per Dr. Chestine Spore. Plan to begin POD#1 diet (H2O) per protocol.

## 2011-10-20 NOTE — Progress Notes (Signed)
Utilization review completed.  

## 2011-10-20 NOTE — Progress Notes (Signed)
Pt alert and oriented; VSS; UGI normal; doppler study negative; denies any nausea or vomiting; denies burping or flatus; voided x 1 since foley removed; ambulated in hallways with minimal assistance; using incentive spirometer; will begin POD #1 diet as ordered by Dr. Daphine Deutscher; pt c/o abdominal pain with minimal relief from prn pain meds; pt already has follow up appts with Dr. Daphine Deutscher; will contact [facility] to set up follow up nutrition appt; discharge instructions given for pt to review.  GASTRIC BYPASS/SLEEVE DISCHARGE INSTRUCTIONS  Drs. Fredrik Rigger, [place], [place], and [place] Call if you have any problems.   Call [phone] and ask for the surgeon on call.    If you need immediate assistance come to the ER at [facility]. Tell the ER personnel that you are a new post-op gastric bypass patient. Signs and symptoms to report:   Severe vomiting or nausea. If you cannot tolerate clear liquids for longer than 1 day, you need to call your surgeon.    Abdominal pain which does not get better after taking your pain medication   Fever greater than 101 F degree   Difficulty breathing   Chest pain    Redness, swelling, drainage, or foul odor at incision sites    If your incisions open or pull apart   Swelling or pain in calf (lower leg)   Diarrhea, frequent watery, uncontrolled bowel movements.   Constipation, (no bowel movements for 3 days) if this occurs, Take Milk of Magnesia, 2 tablespoons by mouth, 3 times a day for 2 days if needed.  Call your doctor if constipation continues. Stop taking Milk of Magnesia once you have had a bowel movement. You may also use Miralax according to the label instructions.   Anything you consider "abnormal for you".   Normal side effects after Surgery:   Unable to sleep at night or concentrate   Irritability   Being tearful (crying) or depressed   These are common complaints, possibly related to your anesthesia, stress of surgery and change in lifestyle, that  usually go away a few weeks after surgery.  If these feelings continue, call your medical doctor.  Wound Care You may have surgical glue, steri-strips, or staples over your incisions after surgery.  Surgical glue:  Looks like a clear film over your incisions and will wear off gradually. Steri-strips: Strips of tape over your incisions. You may notice a yellowish color on the skin underneath the steri-strips. This is a substance used to make the steri-strips stick better. Do not pull the steri-strips off - let them fall off.  Staples: Cherlynn Polo may be removed before you leave the hospital. If you go home with staples, call [facility] [phone]) for an appointment with your surgeon's nurse to have staples removed in 7 - 10 days. Showering: You may shower two days after your surgery unless otherwise instructed by your surgeon. Wash gently around wounds with warm soapy water, rinse well, and gently pat dry.  If you have a drain, you may need someone to hold this while you shower. Avoid tub baths until staples are removed and incisions are healed.    Medications   Medications should be liquid or crushed if larger than the size of a dime.  Extended release pills should not be crushed.   Depending on the size and number of medications you take, you may need to stagger/change the time you take your medications so that you do not over-fill your pouch.    Make sure you follow-up  with your primary care physician to make medication adjustments needed during rapid weight loss and life-style adjustment.   If you are diabetic, follow up with the doctor that prescribes your diabetes medication(s) within one week after surgery and check your blood sugar regularly.   Do not drive while taking narcotics!   Do not take acetaminophen (Tylenol) and Roxicet or Lortab Elixir at the same time since these pain medications contain acetaminophen.  Diet at home: (First 2 Weeks) You will see the nutritionist  two weeks after your surgery. She will advance your diet if you are tolerating liquids well. Once at home, if you have severe vomiting or nausea and cannot tolerate clear liquids lasting longer than 1 day, call your surgeon.  Begin high protein shake 2 ounces every 3 hours, 5 - 6 times per day.  Gradually increase the amount you drink as tolerated.  You may find it easier to slowly sip shakes throughout the day.  It is important to get your proteins in first.   Protein Shake   Drink at least 2 ounces of shake 5-6 times per day   Each serving of protein shakes should have a minimum of 15 grams of protein and no more than 5 grams of carbohydrate    Increase the amount of protein shake you drink as tolerated   Protein powder may be added to fluids such as non-fat milk or Lactaid milk (limit to 20 grams added protein powder per serving   The initial goal is to drink at least 8 ounces of protein shake/drink per day (or as directed by the nutritionist). Some examples of protein shakes are [employer], Dillard's, EAS Edge HP, and Unjury. Hydration   Gradually increase the amount of water and other liquids as tolerated (See Acceptable Fluids)   Gradually increase the amount of protein shake as tolerated     Sip fluids slowly and throughout the day   May use Sugar substitutes, use sparingly (limit to 6 - 8 packets per day). Your fluid goal is 64 ounces of fluid daily. It may take a few weeks to build up to this.         32 oz (or more) should be clear liquids and 32 oz (or more) should be full liquids.         Liquids should not contain sugar, caffeine, or carbonation! Acceptable Fluids Clear Liquids:   Water or Sugar-free flavored water, Fruit H2O   Decaffeinated coffee or tea (sugar-free)   Crystal Lite, Wyler's Lite, Minute Maid Lite   Sugar-free Jell-O   Bouillon or broth   Sugar-free Popsicle:   *Less than 20 calories each; Limit 1 per day   Full Liquids:              Protein  Shakes/Drinks + 2 choices per day of other full liquids shown below.    Other full liquids must be: No more than 12 grams of Carbs per serving,  No more than 3 grams of Fat per serving   Strained low-fat cream soup   Non-Fat milk   Fat-free Lactaid Milk   Sugar-free yogurt (Dannon Lite & Fit) Vitamins and Minerals (Start 1 day after surgery unless otherwise directed)   2 Chewable Multivitamin / Multimineral Supplement (i.e. Centrum for Adults)   Chewable Calcium Citrate with Vitamin D-3. Take 1500 mg each day.           (Example: 3 Chewable Calcium Plus 600 with Vitamin D-3 can be found at  [employer])         Vitamin B-12, 350 - 500 micrograms (oral tablet) each day   Do not mix multivitamins containing iron with calcium supplements; take 2 hours   apart   Do not substitute Tums (calcium carbonate) for your calcium   Menstruating women and those at risk for anemia may need extra iron. Talk with your doctor to see if you need additional iron.    If you need extra iron:  Total daily Iron recommendations (including Vitamins) = 50 - 100 mg Iron/day Do not stop taking or change any vitamins or minerals until you talk to your nutritionist or surgeon. Your nutritionist and / or physician must approve all vitamin and mineral supplements. Exercise For maximum success, begin exercising as soon as your doctor recommends. Make sure your physician approves any physical activity.   Depending on fitness level, begin with a simple walking program   Walk 5-15 minutes each day, 7 days per week.    Slowly increase until you are walking 30-45 minutes per day   Consider joining our BELT program. [phone] or email [email or web address] Things to remember:    You may have sexual relations when you feel comfortable. It is VERY important for male patients to use a reliable birth control method. Fertility often increases after surgery. Do not get pregnant for at least 18 months.   It is very important to keep all follow  up appointments with your surgeon, nutritionist, primary care physician, and behavioral health practitioner. After the first year, please follow up with your bariatric surgeon at least once a year in order to maintain best weight loss results.  [facility]: [phone] [facility]: [phone]   Free counseling is available for you and your family through collaboration between [facility] and [place]. Please call [phone] and leave a message.    Consider purchasing a medical alert bracelet that says you had gastric bypass surgery.    [facility] has a free Bariatric Surgery Support Group that meets monthly, the 3rd Thursday, 6 pm, Classroom #1, [employer]. You may register online at [email or web address], but registration is not necessary. Select Classes and Support Groups, Bariatric Surgery, or Call [phone]   Do not return to work or drive until cleared by your surgeon   Use your CPAP when sleeping if applicable   Do not lift anything greater than ten pounds for at least two weeks  Talmadge Chad, RN Bariatric Nurse Coordinator

## 2011-10-20 NOTE — Progress Notes (Signed)
VASCULAR LAB PRELIMINARY  PRELIMINARY  PRELIMINARY  PRELIMINARY  Bilateral lower extremity venous duplex  completed.    Preliminary report:  Bilateral:  No evidence of DVT, superficial thrombosis, or Baker's Cyst.    Terance Hart, RVT 10/20/2011, 8:57 AM

## 2011-10-21 LAB — DIFFERENTIAL
Basophils Relative: 0 % (ref 0–1)
Eosinophils Absolute: 0 10*3/uL (ref 0.0–0.7)
Eosinophils Relative: 0 % (ref 0–5)
Lymphs Abs: 1.4 10*3/uL (ref 0.7–4.0)
Neutrophils Relative %: 79 % — ABNORMAL HIGH (ref 43–77)

## 2011-10-21 LAB — CBC
MCH: 28.4 pg (ref 26.0–34.0)
MCHC: 32.9 g/dL (ref 30.0–36.0)
MCV: 86.4 fL (ref 78.0–100.0)
Platelets: 228 10*3/uL (ref 150–400)
RBC: 4.93 MIL/uL (ref 4.22–5.81)

## 2011-10-21 NOTE — Progress Notes (Signed)
Pt alert and oriented; VSS; in good spirits; sitting up in chair; pt denies any nausea or vomiting; tolerating water well; will advance to POD #1 diet; +flatus; no BM at this time; voiding well; ambulating in hallways without difficulty; c/o abdominal pain and chronic back pain with minimal relief from prn pain meds; pt already has follow up appts with [facility] and [facility]; aware of  BELT program and support group; continue current plan of care. Talmadge Chad, RN Bariatric Nurse Coordinator

## 2011-10-22 MED ORDER — ZOLPIDEM TARTRATE 5 MG PO TABS
5.0000 mg | ORAL_TABLET | Freq: Every evening | ORAL | Status: DC | PRN
  Administered 2011-10-22: 5 mg via ORAL
  Filled 2011-10-22: qty 1

## 2011-10-22 NOTE — Progress Notes (Signed)
Pt alert and oriented; VSS; tolerating protein shakes and water well; denies any nausea or vomiting; burping and +flatus; no BM; voiding well; ambulating in hallways without difficulty; c/o chronic back pain and some abdominal discomfort with relief from prn pain meds; pt already has follow up appts with [facility] and [facility]; aware of support group and BELT program; discharge instructions reviewed and pt verbalized understanding of.  GASTRIC BYPASS/SLEEVE DISCHARGE INSTRUCTIONS  Drs. Fredrik Rigger, Hoxworth, Wilson, and [place] Call if you have any problems.   Call [phone] and ask for the surgeon on call.    If you need immediate assistance come to the ER at [facility]. Tell the ER personnel that you are a new post-op gastric bypass patient. Signs and symptoms to report:   Severe vomiting or nausea. If you cannot tolerate clear liquids for longer than 1 day, you need to call your surgeon.    Abdominal pain which does not get better after taking your pain medication   Fever greater than 101 F degree   Difficulty breathing   Chest pain    Redness, swelling, drainage, or foul odor at incision sites    If your incisions open or pull apart   Swelling or pain in calf (lower leg)   Diarrhea, frequent watery, uncontrolled bowel movements.   Constipation, (no bowel movements for 3 days) if this occurs, Take Milk of Magnesia, 2 tablespoons by mouth, 3 times a day for 2 days if needed.  Call your doctor if constipation continues. Stop taking Milk of Magnesia once you have had a bowel movement. You may also use Miralax according to the label instructions.   Anything you consider "abnormal for you".   Normal side effects after Surgery:   Unable to sleep at night or concentrate   Irritability   Being tearful (crying) or depressed   These are common complaints, possibly related to your anesthesia, stress of surgery and change in lifestyle, that usually go away a few weeks after surgery.  If these feelings  continue, call your medical doctor.  Wound Care You may have surgical glue, steri-strips, or staples over your incisions after surgery.  Surgical glue:  Looks like a clear film over your incisions and will wear off gradually. Steri-strips: Strips of tape over your incisions. You may notice a yellowish color on the skin underneath the steri-strips. This is a substance used to make the steri-strips stick better. Do not pull the steri-strips off - let them fall off.  Staples: Cherlynn Polo may be removed before you leave the hospital. If you go home with staples, call [facility] [phone]) for an appointment with your surgeon's nurse to have staples removed in 7 - 10 days. Showering: You may shower two days after your surgery unless otherwise instructed by your surgeon. Wash gently around wounds with warm soapy water, rinse well, and gently pat dry.  If you have a drain, you may need someone to hold this while you shower. Avoid tub baths until staples are removed and incisions are healed.    Medications   Medications should be liquid or crushed if larger than the size of a dime.  Extended release pills should not be crushed.   Depending on the size and number of medications you take, you may need to stagger/change the time you take your medications so that you do not over-fill your pouch.    Make sure you follow-up with your primary care physician to make medication adjustments needed during rapid weight loss  and life-style adjustment.   If you are diabetic, follow up with the doctor that prescribes your diabetes medication(s) within one week after surgery and check your blood sugar regularly.   Do not drive while taking narcotics!   Do not take acetaminophen (Tylenol) and Roxicet or Lortab Elixir at the same time since these pain medications contain acetaminophen.  Diet at home: (First 2 Weeks) You will see the nutritionist two weeks after your surgery. She will advance your diet if you  are tolerating liquids well. Once at home, if you have severe vomiting or nausea and cannot tolerate clear liquids lasting longer than 1 day, call your surgeon.  Begin high protein shake 2 ounces every 3 hours, 5 - 6 times per day.  Gradually increase the amount you drink as tolerated.  You may find it easier to slowly sip shakes throughout the day.  It is important to get your proteins in first.   Protein Shake   Drink at least 2 ounces of shake 5-6 times per day   Each serving of protein shakes should have a minimum of 15 grams of protein and no more than 5 grams of carbohydrate    Increase the amount of protein shake you drink as tolerated   Protein powder may be added to fluids such as non-fat milk or Lactaid milk (limit to 20 grams added protein powder per serving   The initial goal is to drink at least 8 ounces of protein shake/drink per day (or as directed by the nutritionist). Some examples of protein shakes are [employer], [employer]illard's, E[employer] Edge HP, and Unjury. Hydration   Gradually increase the amount of water and other liquids as tolerated (See Acceptable Fluids)   Gradually increase the amount of protein shake as tolerated     Sip fluids slowly and throughout the day   May use Sugar substitutes, use sparingly (limit to 6 - 8 packets per day). Your fluid goal is 64 ounces of fluid daily. It may take a few weeks to build up to this.         32 oz (or more) should be clear liquids and 32 oz (or more) should be full liquids.         Liquids should not contain sugar, caffeine, or carbonation! Acceptable Fluids Clear Liquids:   Water or Sugar-free flavored water, Fruit H2O   Decaffeinated coffee or tea (sugar-free)   Crystal Lite, Wyler's Lite, Minute Maid Lite   Sugar-free Jell-O   Bouillon or broth   Sugar-free Popsicle:   *Less than 20 calories each; Limit 1 per day   Full Liquids:              Protein Shakes/Drinks + 2 choices per day of other full liquids shown below.     Other full liquids must be: No more than 12 grams of Carbs per serving,  No more than 3 grams of Fat per serving   Strained low-fat cream soup   Non-Fat milk   Fat-free Lactaid Milk   Sugar-free yogurt (Dannon Lite & Fit) Vitamins and Minerals (Start 1 day after surgery unless otherwise directed)   2 Chewable Multivitamin / Multimineral Supplement (i.e. Centrum for Adults)   Chewable Calcium Citrate with Vitamin D-3. Take 1500 mg each day.           (Example: 3 Chewable Calcium Plus 600 with Vitamin D-3 can be found at [facility])         Vitamin B-12, 350 - 500  micrograms (oral tablet) each day   Do not mix multivitamins containing iron with calcium supplements; take 2 hours   apart   Do not substitute Tums (calcium carbonate) for your calcium   Menstruating women and those at risk for anemia may need extra iron. Talk with your doctor to see if you need additional iron.    If you need extra iron:  Total daily Iron recommendations (including Vitamins) = 50 - 100 mg Iron/day Do not stop taking or change any vitamins or minerals until you talk to your nutritionist or surgeon. Your nutritionist and / or physician must approve all vitamin and mineral supplements. Exercise For maximum success, begin exercising as soon as your doctor recommends. Make sure your physician approves any physical activity.   Depending on fitness level, begin with a simple walking program   Walk 5-15 minutes each day, 7 days per week.    Slowly increase until you are walking 30-45 minutes per day   Consider joining our BELT program. [phone] or email [email or web address] Things to remember:    You may have sexual relations when you feel comfortable. It is VERY important for male patients to use a reliable birth control method. Fertility often increases after surgery. Do not get pregnant for at least 18 months.   It is very important to keep all follow up appointments with your surgeon, nutritionist, primary care physician,  and behavioral health practitioner. After the first year, please follow up with your bariatric surgeon at least once a year in order to maintain best weight loss results.  [facility]: [phone] [facility]: [phone]   Free counseling is available for you and your family through collaboration between [facility] and [place]. Please call [phone] and leave a message.    Consider purchasing a medical alert bracelet that says you had gastric bypass surgery.    [facility] has a free Bariatric Surgery Support Group that meets monthly, the 3rd Thursday, 6 pm, Classroom #1, [employer]. You may register online at [email or web address], but registration is not necessary. Select Classes and Support Groups, Bariatric Surgery, or Call [phone]   Do not return to work or drive until cleared by your surgeon   Use your CPAP when sleeping if applicable   Do not lift anything greater than ten pounds for at least two weeks  Talmadge Chad, RN Bariatric Nurse Coordinator

## 2011-10-22 NOTE — Progress Notes (Signed)
Patient ID: Kyle [name], male   DOB: 01-Nov-1955, 56 y.o.   MRN: [id number] [facility] Progress Note:   3 Days Post-Op  Subjective: Mental status is clear Objective: Vital signs in last 24 hours: Temp:  [97.5 F (36.4 C)-97.8 F (36.6 C)] 97.7 F (36.5 C) (05/09 0517) Pulse Rate:  [43-73] 59  (05/09 0517) Resp:  [15-24] 18  (05/09 0517) BP: (122-170)/(66-90) 146/87 mmHg (05/09 0517) SpO2:  [94 %-98 %] 98 % (05/09 0517)  Intake/Output from previous day: 05/08 0701 - 05/09 0700 In: 1060 [P.O.:60; I.V.:1000] Out: 3325 [Urine:3275; Drains:50] Intake/Output this shift:    Physical Exam: Work of breathing is  Normal.  Doing well thus far.  Non tender.    Lab Results:  Results for orders placed during the hospital encounter of 10/19/11 (from the past 48 hour(s))  HEMOGLOBIN AND HEMATOCRIT, BLOOD     Status: Normal   Collection Time   10/20/11  5:50 PM      Component Value Range Comment   Hemoglobin 13.7  13.0 - 17.0 (g/dL)    HCT 11.9  14.7 - 82.9 (%)   CBC     Status: Abnormal   Collection Time   10/21/11  5:30 AM      Component Value Range Comment   WBC 10.7 (*) 4.0 - 10.5 (K/uL)    RBC 4.93  4.22 - 5.81 (MIL/uL)    Hemoglobin 14.0  13.0 - 17.0 (g/dL)    HCT 56.2  13.0 - 86.5 (%)    MCV 86.4  78.0 - 100.0 (fL)    MCH 28.4  26.0 - 34.0 (pg)    MCHC 32.9  30.0 - 36.0 (g/dL)    RDW 78.4 (*) 69.6 - 15.5 (%)    Platelets 228  150 - 400 (K/uL)   DIFFERENTIAL     Status: Abnormal   Collection Time   10/21/11  5:30 AM      Component Value Range Comment   Neutrophils Relative 79 (*) 43 - 77 (%)    Neutro Abs 8.4 (*) 1.7 - 7.7 (K/uL)    Lymphocytes Relative 13  12 - 46 (%)    Lymphs Abs 1.4  0.7 - 4.0 (K/uL)    Monocytes Relative 8  3 - 12 (%)    Monocytes Absolute 0.9  0.1 - 1.0 (K/uL)    Eosinophils Relative 0  0 - 5 (%)    Eosinophils Absolute 0.0  0.0 - 0.7 (K/uL)    Basophils Relative 0  0 - 1 (%)    Basophils Absolute 0.0  0.0 - 0.1 (K/uL)      Radiology/Results: Dg Ugi W/water Sol Cm  10/20/2011  *RADIOLOGY REPORT*  Clinical Data:  Postop day #1 sleeve gastrectomy.  UPPER GI SERIES WITH KUB  Technique:  Routine upper GI series was performed with 50 ml Omnipaque-300 orally  Fluoroscopy Time: 1.6 minutes  Comparison:  Upper GI 04/10/2011  Findings: Preliminary image of the abdomen reveals surgical drain present in the epigastric region,  negative for bowel obstruction. Mild dilatation of the colon compatible with ileus.  Limited anatomical evaluation due to a small amount of contrast and morbid obesity.  Esophageal motility is satisfactory.  The esophagus is diffusely dilated without obstruction.  Contrast enters the stomach.  There has  been partial resection of the stomach.  Contrast fills the gastric antrum with emptying into the duodenum.  No obstruction or leak.  IMPRESSION: Negative for obstruction or leak.  Limited anatomical evaluation.  Original Report Authenticated By: Camelia Phenes, M.D.    Anti-infectives: Anti-infectives     Start     Dose/Rate Route Frequency Ordered Stop   10/19/11 0926   cefOXitin (MEFOXIN) 2 g in dextrose 5 % 50 mL IVPB        2 g 100 mL/hr over 30 Minutes Intravenous 60 min pre-op 10/19/11 0960 10/19/11 1240          Assessment/Plan: Problem List: Patient Active Problem List  Diagnoses  . Obesity    Advance to PD2 bariatric diet 3 Days Post-Op    LOS: 3 days   Matt B. Daphine Deutscher, MD, [facility] [phone] beeper [phone]  10/22/2011 8:41 AM

## 2011-10-23 MED ORDER — OXYCODONE-ACETAMINOPHEN 5-325 MG/5ML PO SOLN: 5.0000 mL | ORAL | Status: DC | PRN

## 2011-10-23 NOTE — Discharge Summary (Signed)
Physician Discharge Summary  Patient ID: Trashawn Oquendo MRN: [id number] DOB/AGE: 01/19/1956 56 y.o.  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 *    Surgery:  Laparoscopic enterolysis and conversion to sleeve gastrectomy, endoscopy  Discharged Condition: improved  Hospital Course:   Patient had surgery and was observed.  A swallow was performed which showed no leak and he was begun on liquids.  His JP drain was removed and he was ready for discharge  Consults: none  Significant Diagnostic Studies: UGI    Discharge Exam: Blood pressure 137/72, pulse 80, temperature 98.1 F (36.7 C), temperature source Oral, resp. rate 18, height 6\' 5"  (1.956 m), weight 421 lb 4.8 oz (191.1 kg), SpO2 93.00%. Minimal abdominal pain.   Disposition: 01-Home or Self Care  Discharge Orders    Future Appointments: Provider: Department: Dept Phone: Center:   11/03/2011 4:00 PM [facility] [phone] N[facility]   11/12/2011 9:40 AM Valarie Merino, MD [facility] [phone] None     Medication List  As of 10/23/2011  7:30 AM   TAKE these medications         nabumetone 500 MG tablet   Commonly known as: RELAFEN   Take 500 mg by mouth 2 (two) times daily.      naproxen sodium 220 MG tablet   Commonly known as: ANAPROX   Take 440 mg by mouth 2 (two) times daily as needed. For headache      OXYCODONE HCL PO   Take by mouth. 30 MG PO EVERY 6 HOURS --VERIFIED BY [facility] AT [facility] [phone].  PT TAKES 4 TIMES A DAY--IT IS NOT PRN      oxyCODONE-acetaminophen 5-325 MG/5ML solution   Commonly known as: ROXICET   Take 5 mLs by mouth every 4 (four) hours as needed for pain.      warfarin 5 MG tablet   Commonly known as: COUMADIN   Take 7.5-15 mg by mouth daily. Monday  Tuesday and Wednesday takes 3 tablets. Thursday Friday Sunday  takes 2 tablets. Saturday takes 1 and a half           Follow-up Information    Follow up with Shelene Krage B, MD in 3 weeks.   Contact information:   [facility] [address] [place] [address] [phone]          Signed: Valarie Merino 10/23/2011, 7:30 AM

## 2011-10-23 NOTE — Discharge Instructions (Signed)
Sleeve Gastrectomy A sleeve gastrectomy is an operation that removes a large portion of your stomach. This operation is performed to help you lose weight. You lose weight with this operation because it restricts the amount of food you can eat. Your stomach will be a narrow tube after the operation (the size of a banana). Your stomach will hold much less food than your normal stomach. Also, the portion of your stomach that is removed produces a hormone that causes hunger. You are a candidate for this operation if you have morbid obesity, defined as a body mass index (BMI) greater than 40. You may also be a candidate if you have severe obesity related diseases such as: diabetes mellitus 2, obstructive sleep apnea, or cardiopulmonary disease (heart and lung) with a BMI greater than 35. You will need to talk with your surgeon and insurance company to find out if this surgery is right for you.  Sleeve gastrectomy is a good alternative to other treatments of obesity (bariatric) operations. It does not require any adjustments after the operation compared with an adjustable gastric band. Also, it is safer than a gastric bypass. RISKS AND COMPLICATIONS Some of the problems that can occur from this procedure include:  Infection. A germ starts growing in the incision sites. This can usually be treated with antibiotics.   Bleeding. This can occur with any surgery. Your surgeon will take all precautions to minimize this risk.   Damage to tissue or organs in the area may occur. If there is excessive damage, the surgeon may need to change to an open surgery. In this case, one large incision will be made in the center of your abdomen.   Leakage. The fluid in your stomach may leak into the abdominal cavity. If this happens, you may need another surgery to fix the leak.  BEFORE THE PROCEDURE Before your operation you will meet with your surgeon and their team for the treatment of obesity. Here you will find out if you  are a candidate for bariatric surgery. The risks and the benefits of the operation will be explained. You will also meet a:  Dietician who will guide you with your preoperative and postoperative diet.   An internal medical doctor to manage your obesity related illnesses.   A psychology team to help with cravings or other mental difficulties.  In addition:  You will be directed to have certain lab work and x-rays performed.   You will schedule a special test called a manometry. This test evaluates your esophagus and how it moves.   You will be placed on a special liquid diet two to three weeks before your operation. This diet helps you lose weight before the operation and decrease the amount of fat in the abdomen. It makes the operation easier for the surgeon and safer for the patient. The dietician will share the details of this with you.  Before your operation:  Make sure you follow your surgeon's instructions exactly. Stop or continue medications they recommend.   Do not eat or drink anything after midnight.   Arrive at the hospital 1 hour before your surgery for check in.   Shower the morning of your operation.  PROCEDURE  Most sleeve gastrectomies are performed using a laparoscope. A laparoscope is a thin, lighted, pencil-sized tube. Once you are anesthetized (asleep), the surgeon inflates your belly (abdomen) with a gas (carbon dioxide) that makes room to operate. It also makes your organs easier to see. The laparoscope is inserted into  the abdomen through a small incision. Other small instruments are inserted into the abdomen through other small incisions. During the operation, the stomach is divided using a stapler. Part of the stomach is removed through one of the incisions. The remaining stomach is reinforced using a stitch (suture) and surgical glue to prevent leakage of the gastric contents. At the end of the procedure, the gas is removed from the inside of your abdomen. The  incisions are closed with stitches. These may be covered with a dressing or left open. Because the incisions are small, there is usually minimal discomfort. You will wake up in a recovery room. Once your anesthesia has worn off, you will be moved to your hospital room. AFTER THE PROCEDURE  You will stay in the hospital, on average, for two days.   You will be given pain medication and anti-nausea medication.   You may have a drain from one of the incisions in your abdomen. This drain will stay in place until your first postoperative visit.   The nursing staff will assist you in getting out of bed the day of, or one day after, your surgery.   You will start on a liquid diet, the first day after your operation. The dietician will recommend this diet.   Taking deep breaths and coughing is very important to avoid pneumonia.  Document Released: 03/29/2009 Document Revised: 05/21/2011 Document Reviewed: 03/29/2009 [facility] Patient Information 2012 [place], [place].   STAY ON FULL LIQUIDS FOR A WEEK.  THEN ADVANCE TO LOW CARB DIET.

## 2011-10-26 DIAGNOSIS — I4891 Unspecified atrial fibrillation: Secondary | ICD-10-CM | POA: Diagnosis not present

## 2011-11-03 ENCOUNTER — Ambulatory Visit: Payer: Medicare Other

## 2011-11-12 ENCOUNTER — Encounter: Payer: Self-pay | Admitting: *Deleted

## 2011-11-12 ENCOUNTER — Ambulatory Visit (INDEPENDENT_AMBULATORY_CARE_PROVIDER_SITE_OTHER): Payer: Medicare Other | Admitting: Surgery

## 2011-11-12 ENCOUNTER — Encounter (INDEPENDENT_AMBULATORY_CARE_PROVIDER_SITE_OTHER): Payer: Self-pay | Admitting: Surgery

## 2011-11-12 ENCOUNTER — Encounter: Payer: Medicare Other | Attending: Surgery | Admitting: *Deleted

## 2011-11-12 VITALS — BP 136/84 | HR 70 | Temp 96.9°F | Resp 14 | Ht 77.5 in | Wt 375.3 lb

## 2011-11-12 VITALS — Ht 77.5 in | Wt 374.7 lb

## 2011-11-12 DIAGNOSIS — Z01818 Encounter for other preprocedural examination: Secondary | ICD-10-CM | POA: Diagnosis not present

## 2011-11-12 DIAGNOSIS — Z713 Dietary counseling and surveillance: Secondary | ICD-10-CM | POA: Diagnosis not present

## 2011-11-12 DIAGNOSIS — Z903 Acquired absence of stomach [part of]: Secondary | ICD-10-CM

## 2011-11-12 DIAGNOSIS — Z9889 Other specified postprocedural states: Secondary | ICD-10-CM

## 2011-11-12 DIAGNOSIS — E669 Obesity, unspecified: Secondary | ICD-10-CM

## 2011-11-12 NOTE — Progress Notes (Addendum)
  Bariatric Class:  Appt start time: 0845 end time:  0915.  2-3 Week Post-Operative Nutrition Follow-Up Patient was seen on 11/12/2011 for Post-Operative Nutrition education [facility]. Reports doing very well with minimal pain in abdomen (from surgery) and in back. Has started walking 2 miles/day and reports he lifts 200 lbs (x10 reps) 4 times a day. Advised to seek clearance from surgeon for this as his surgery was quite extensive.  Reports no problems with N/V, diarrhea, constipation, etc. Following all nutritional post-op guidelines.   Surgery date: 10/19/11 Surgery type: Gastric Sleeve Last weight @ [facility]: 420.2 lbs (05/27/12)  Weight today: 374.7 lbs Weight change: 45.5 lbs Total weight lost: 45.5 lbs (80.5 lbs since 12/21/10) BMI: 43.9 mg/k^2  Medications: See med list; reconciled with pt. Reports only taking oxycodone at this time. Waiting on MD instruction to resume others.  Supplements: Taking all as directed.   24 hr dietary recall:  B: Atkins protein shake  S: 1 egg or Atkins bar L: Gelatin, protein shake S: Protein shake (Atkins) D: Angus burger (3 oz) or ground chicken breast, green beans S: Protein shake (Atkins)  Fluid intake: ~150 oz Protein intake: ~85-90 g  The following the learning objective met the patient during this visit:  Identifies Phase 3A (Soft, High Proteins) Dietary Goals and will begin from 2 weeks post-operatively to 2 months post-operatively  Identifies appropriate sources of fluids and proteins   States protein recommendations and appropriate sources post-operatively  Identifies the need for appropriate texture modifications, mastication, and bite sizes when consuming solids  Identifies appropriate multivitamin and calcium sources post-operatively  Describes the need for physical activity post-operatively and will follow MD recommendations  States when to call healthcare provider regarding medication  questions or post-operative complications  Handouts given during class include:  Phase 3A: Soft, High Protein Diet Handout  Follow-Up Plan: Patient will follow-up at [facility] in 6 weeks for 2 months post-op nutrition visit for diet advancement per MD.

## 2011-11-12 NOTE — Patient Instructions (Signed)
Patient to follow Phase 3A-Soft, High Protein Diet and follow-up at [facility] in 6 weeks for 2 months post-op nutrition visit for diet advancement. 

## 2011-11-12 NOTE — Progress Notes (Signed)
Kyle [name] 56 y.o.  Body mass index is 43.93 kg/(m^2).  Patient Active Problem List  Diagnoses  . Obesity    Allergies  Allergen Reactions  . Vioxx (Rofecoxib)     "BAD RASH-LIKE RED RASH"    Past Surgical History  Procedure Date  . Gastric restriction surgery 1992  . Leg surgery 1998    calcium deposit removed on right  leg   . Foot surgery 2003    right foot surgery from work accident   . Esophagogastroduodenoscopy 05/18/2011    Procedure: ESOPHAGOGASTRODUODENOSCOPY (EGD);  Surgeon: Kandis Cocking, MD;  Location: [facility];  Service: Endoscopy;  Laterality: N/A;  . Gastric bypass 10/19/11   Terald Sleeper, MD, MD No diagnosis found.  PREOPERATIVE DIAGNOSIS: Failed gastroplasty from the early 1990s done  in [place].  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 Ezzard Standing to delineate  anatomy and assist with planning of sleeve gastrectomy.  SURGEON: [place]. Daphine Deutscher, MD  ASSISTANT: Sandria Bales. Ezzard Standing, M.D. Procedure:  Sleeve Gastrectomy (modified) Matt B. Daphine Deutscher, MD, [facility] [phone] beeper [phone]  11/12/2011 10:40 AM

## 2011-12-16 ENCOUNTER — Encounter: Payer: Self-pay | Admitting: *Deleted

## 2011-12-16 ENCOUNTER — Encounter: Payer: Medicare Other | Attending: Surgery | Admitting: *Deleted

## 2011-12-16 VITALS — Ht 77.5 in | Wt 359.5 lb

## 2011-12-16 DIAGNOSIS — Z713 Dietary counseling and surveillance: Secondary | ICD-10-CM | POA: Diagnosis not present

## 2011-12-16 DIAGNOSIS — Z01818 Encounter for other preprocedural examination: Secondary | ICD-10-CM | POA: Insufficient documentation

## 2011-12-16 DIAGNOSIS — E669 Obesity, unspecified: Secondary | ICD-10-CM

## 2011-12-16 NOTE — Patient Instructions (Addendum)
Goals:  Follow Phase 3B: High Protein + Non-Starchy Vegetables  Eat 3-6 small meals/snacks, every 3-5 hrs  Increase lean protein foods to meet 80g goal  Increase fluid intake to 64oz +  Avoid drinking 15 minutes before, during and 30 minutes after eating  Aim for >30 min of physical activity daily 

## 2011-12-16 NOTE — Progress Notes (Addendum)
Follow-up visit:  8 Weeks Post-Operative Gastric Sleeve Surgery  Medical Nutrition Therapy:  Appt start time: 1200 end time:  1230.  Primary concerns today: Post-operative Bariatric Surgery Nutrition Management.  Surgery date: 10/19/11 Surgery type: Gastric Sleeve Last weight @ [facility]: 420.2 lbs (05/27/12)  Weight today: 359.5 lbs Weight change: 14.0 lbs Total weight lost: 60.7 lbs  BMI: 42.1 kg/m^2  Goal Weight: 240-260 lbs % goal met: 33-37%  Medications:  Still only taking oxycodone at this time.   Supplements: Taking all as directed.   24 hr dietary recall: (Per pt, no changes to dietary intake) B: Atkins protein shake  S: 1 egg or Atkins bar L: Gelatin, protein shake S: Protein shake (Atkins) D: [place] burger (3 oz) or ground chicken breast, green beans S: Protein shake (Atkins)  Fluid intake: ~150 oz Protein intake: ~85-90 g   Nutritional Diagnosis:  [place]-3.3 Overweight/obesity As related to recent Gastric Sleeve surgery.  As evidenced by patient following post-op nutrition guidelines for continued weight loss.    Handouts given during class include:  Phase 3B: High Protein + Non-Starchy Vegetables handout  Intervention:  Nutrition education.  Monitoring/Evaluation:  Dietary intake, exercise, lap band fills, and body weight. Follow up in 1 months for 3 month post-op visit.

## 2011-12-21 DIAGNOSIS — I4891 Unspecified atrial fibrillation: Secondary | ICD-10-CM | POA: Diagnosis not present

## 2011-12-21 DIAGNOSIS — E662 Morbid (severe) obesity with alveolar hypoventilation: Secondary | ICD-10-CM | POA: Diagnosis not present

## 2011-12-21 DIAGNOSIS — M546 Pain in thoracic spine: Secondary | ICD-10-CM | POA: Diagnosis not present

## 2012-01-08 ENCOUNTER — Ambulatory Visit (INDEPENDENT_AMBULATORY_CARE_PROVIDER_SITE_OTHER): Payer: Medicare Other | Admitting: Surgery

## 2012-01-13 ENCOUNTER — Ambulatory Visit: Payer: Medicare Other | Admitting: *Deleted

## 2012-02-09 ENCOUNTER — Encounter (INDEPENDENT_AMBULATORY_CARE_PROVIDER_SITE_OTHER): Payer: Self-pay | Admitting: General Surgery

## 2012-02-11 ENCOUNTER — Encounter (INDEPENDENT_AMBULATORY_CARE_PROVIDER_SITE_OTHER): Payer: Medicare Other | Admitting: Surgery

## 2012-04-04 DIAGNOSIS — R634 Abnormal weight loss: Secondary | ICD-10-CM | POA: Diagnosis not present

## 2012-04-04 DIAGNOSIS — Z125 Encounter for screening for malignant neoplasm of prostate: Secondary | ICD-10-CM | POA: Diagnosis not present

## 2012-04-04 DIAGNOSIS — E662 Morbid (severe) obesity with alveolar hypoventilation: Secondary | ICD-10-CM | POA: Diagnosis not present

## 2012-04-04 DIAGNOSIS — K449 Diaphragmatic hernia without obstruction or gangrene: Secondary | ICD-10-CM | POA: Diagnosis not present

## 2012-04-04 DIAGNOSIS — I4891 Unspecified atrial fibrillation: Secondary | ICD-10-CM | POA: Diagnosis not present

## 2012-09-12 ENCOUNTER — Other Ambulatory Visit: Payer: Self-pay | Admitting: *Deleted

## 2012-09-12 MED ORDER — OXYCODONE HCL 30 MG PO TABS: ORAL_TABLET | ORAL | Status: DC

## 2012-09-12 MED ORDER — ALPRAZOLAM 0.5 MG PO TBDP: ORAL_TABLET | ORAL | Status: DC

## 2012-09-20 ENCOUNTER — Ambulatory Visit (INDEPENDENT_AMBULATORY_CARE_PROVIDER_SITE_OTHER): Payer: Medicare Other | Admitting: Internal Medicine

## 2012-09-20 ENCOUNTER — Encounter: Payer: Self-pay | Admitting: Internal Medicine

## 2012-09-20 VITALS — BP 162/96 | HR 80 | Temp 98.1°F | Resp 16 | Ht 77.8 in | Wt 366.0 lb

## 2012-09-20 DIAGNOSIS — M549 Dorsalgia, unspecified: Secondary | ICD-10-CM

## 2012-09-20 DIAGNOSIS — F411 Generalized anxiety disorder: Secondary | ICD-10-CM

## 2012-09-20 DIAGNOSIS — G8929 Other chronic pain: Secondary | ICD-10-CM

## 2012-09-20 DIAGNOSIS — K219 Gastro-esophageal reflux disease without esophagitis: Secondary | ICD-10-CM | POA: Insufficient documentation

## 2012-09-20 DIAGNOSIS — F4323 Adjustment disorder with mixed anxiety and depressed mood: Secondary | ICD-10-CM | POA: Insufficient documentation

## 2012-09-20 DIAGNOSIS — E662 Morbid (severe) obesity with alveolar hypoventilation: Secondary | ICD-10-CM

## 2012-09-20 DIAGNOSIS — R030 Elevated blood-pressure reading, without diagnosis of hypertension: Secondary | ICD-10-CM

## 2012-09-20 MED ORDER — ALPRAZOLAM 0.5 MG PO TABS: 0.5000 mg | ORAL_TABLET | Freq: Every evening | ORAL | Status: DC | PRN

## 2012-09-20 MED ORDER — PANTOPRAZOLE SODIUM 40 MG PO TBEC: 40.0000 mg | DELAYED_RELEASE_TABLET | Freq: Every day | ORAL | Status: DC

## 2012-09-20 NOTE — Assessment & Plan Note (Signed)
Continue PPI at current dose and monitor 

## 2012-09-20 NOTE — Assessment & Plan Note (Signed)
Well controlled on oxycontin and nabumetone and aleve. Monitor. Back precautions

## 2012-09-20 NOTE — Assessment & Plan Note (Signed)
Anxiety and rush at present could be contributing to this. No hx of htn in past. Will monitor bp next visit. No symptoms currently.

## 2012-09-20 NOTE — Assessment & Plan Note (Signed)
Well controlled with current regimen-- monitor

## 2012-09-20 NOTE — Assessment & Plan Note (Signed)
Not using cpap currently. He has had sleep study but not the mask fit study done. He is willing to go for the test but wants to hold off on it until next office visit.

## 2012-09-20 NOTE — Progress Notes (Signed)
  Subjective:    Patient ID: Kyle [name], male    DOB: 07/02/1955, 57 y.o.   MRN: [id number]  CC- routine follow up visit  HPI  Pt of dr Leanord Hawking here for his routine follow up. He denies any complaint this visit. His pain is currently under control with current regimen. Reviewed medication. He is now off coumadin and follows with [facility]. He is travelling to [place] today and is in a rush. He is anxious as he is running late. His blood pressure has been elevated in office today.denies any headache, chest pain, sob or change of vision  Review of Systems  Constitutional: Negative for fever, chills and fatigue.  HENT: Negative for congestion and postnasal drip.   Eyes: Negative.   Respiratory: Negative for cough and shortness of breath.   Cardiovascular: Negative for chest pain, palpitations and leg swelling.  Gastrointestinal: Negative for abdominal pain.  Genitourinary: Negative for dysuria and frequency.  Musculoskeletal: Negative for back pain and gait problem.  Neurological: Negative for dizziness, weakness and numbness.  Hematological: Negative for adenopathy.  Psychiatric/Behavioral: Negative for confusion and agitation.       Objective:   Physical Exam  Constitutional: He is oriented to person, place, and time. No distress.  obese  HENT:  Head: Normocephalic and atraumatic.  Mouth/Throat: Oropharynx is clear and moist.  Eyes: Conjunctivae and EOM are normal. Pupils are equal, round, and reactive to light.  Neck: Normal range of motion. Neck supple. No thyromegaly present.  Cardiovascular: Normal rate and regular rhythm.   Pulmonary/Chest: Effort normal and breath sounds normal.  Abdominal: Soft. Bowel sounds are normal.  Musculoskeletal: Normal range of motion. He exhibits no edema.  Neurological: He is alert and oriented to person, place, and time.  Skin: Skin is warm and dry. He is not diaphoretic.  Psychiatric: He has a normal mood and affect.    BP 162/96  Pulse 80  Temp(Src) 98.1 F (36.7 C) (Oral)  Resp 16  Ht 6' 5.8" (1.976 m)  Wt 366 lb (166.017 kg)  BMI 42.52 kg/m2     Assessment & Plan:   GERD (gastroesophageal reflux disease) Continue PPI at current dose and monitor  Anxiety state, unspecified Well controlled with current regimen-- monitor  Back pain, chronic Well controlled on oxycontin and nabumetone and aleve. Monitor. Back precautions  Obesity hypoventilation syndrome Not using cpap currently. He has had sleep study but not the mask fit study done. He is willing to go for the test but wants to hold off on it until next office visit.   Elevated blood pressure reading without diagnosis of hypertension Anxiety and rush at present could be contributing to this. No hx of htn in past. Will monitor bp next visit. No symptoms currently.

## 2012-10-10 ENCOUNTER — Other Ambulatory Visit: Payer: Self-pay | Admitting: *Deleted

## 2012-10-10 MED ORDER — OXYCODONE HCL 30 MG PO TABS: ORAL_TABLET | ORAL | Status: DC

## 2012-10-22 ENCOUNTER — Other Ambulatory Visit: Payer: Self-pay | Admitting: Internal Medicine

## 2012-11-08 ENCOUNTER — Other Ambulatory Visit: Payer: Self-pay | Admitting: *Deleted

## 2012-11-08 MED ORDER — OXYCODONE HCL 30 MG PO TABS: ORAL_TABLET | ORAL | Status: DC

## 2012-12-02 ENCOUNTER — Other Ambulatory Visit: Payer: Self-pay | Admitting: Geriatric Medicine

## 2012-12-02 DIAGNOSIS — F411 Generalized anxiety disorder: Secondary | ICD-10-CM

## 2012-12-02 MED ORDER — ALPRAZOLAM 0.5 MG PO TABS: 0.5000 mg | ORAL_TABLET | Freq: Every evening | ORAL | Status: DC | PRN

## 2012-12-06 ENCOUNTER — Other Ambulatory Visit: Payer: Self-pay | Admitting: *Deleted

## 2012-12-06 MED ORDER — OXYCODONE HCL 30 MG PO TABS: ORAL_TABLET | ORAL | Status: DC

## 2012-12-15 ENCOUNTER — Other Ambulatory Visit: Payer: Medicare Other

## 2012-12-15 DIAGNOSIS — K219 Gastro-esophageal reflux disease without esophagitis: Secondary | ICD-10-CM

## 2012-12-16 LAB — LIPID PANEL
Chol/HDL Ratio: 4.2 ratio units (ref 0.0–5.0)
Triglycerides: 61 mg/dL (ref 0–149)

## 2012-12-16 LAB — CBC WITH DIFFERENTIAL/PLATELET
Basos: 0 % (ref 0–3)
Eos: 2 % (ref 0–5)
HCT: 42.5 % (ref 37.5–51.0)
Hemoglobin: 14.3 g/dL (ref 12.6–17.7)
Lymphocytes Absolute: 1.3 10*3/uL (ref 0.7–3.1)
Lymphs: 20 % (ref 14–46)
MCHC: 33.6 g/dL (ref 31.5–35.7)
Monocytes: 11 % (ref 4–12)
Neutrophils Absolute: 4.6 10*3/uL (ref 1.4–7.0)
RBC: 4.92 x10E6/uL (ref 4.14–5.80)

## 2012-12-16 LAB — COMPREHENSIVE METABOLIC PANEL
AST: 17 IU/L (ref 0–40)
Alkaline Phosphatase: 84 IU/L (ref 39–117)
BUN/Creatinine Ratio: 16 (ref 9–20)
CO2: 26 mmol/L (ref 18–29)
Chloride: 100 mmol/L (ref 97–108)
Creatinine, Ser: 0.81 mg/dL (ref 0.76–1.27)
GFR calc Af Amer: 116 mL/min/{1.73_m2} (ref >59)
Globulin, Total: 2.4 g/dL (ref 1.5–4.5)
Sodium: 140 mmol/L (ref 134–144)
Total Bilirubin: 0.6 mg/dL (ref 0.0–1.2)

## 2012-12-19 ENCOUNTER — Other Ambulatory Visit: Payer: Self-pay | Admitting: *Deleted

## 2012-12-19 ENCOUNTER — Encounter: Payer: Self-pay | Admitting: *Deleted

## 2012-12-20 ENCOUNTER — Ambulatory Visit: Payer: Medicare Other | Admitting: Internal Medicine

## 2012-12-21 ENCOUNTER — Ambulatory Visit (INDEPENDENT_AMBULATORY_CARE_PROVIDER_SITE_OTHER): Payer: Medicare Other | Admitting: Internal Medicine

## 2012-12-21 ENCOUNTER — Encounter: Payer: Self-pay | Admitting: Internal Medicine

## 2012-12-21 VITALS — BP 154/88 | HR 75 | Temp 98.0°F | Resp 14 | Ht 77.8 in | Wt 362.8 lb

## 2012-12-21 DIAGNOSIS — I4891 Unspecified atrial fibrillation: Secondary | ICD-10-CM | POA: Diagnosis not present

## 2012-12-21 DIAGNOSIS — G4700 Insomnia, unspecified: Secondary | ICD-10-CM | POA: Insufficient documentation

## 2012-12-21 DIAGNOSIS — F411 Generalized anxiety disorder: Secondary | ICD-10-CM

## 2012-12-21 DIAGNOSIS — I10 Essential (primary) hypertension: Secondary | ICD-10-CM | POA: Insufficient documentation

## 2012-12-21 DIAGNOSIS — J449 Chronic obstructive pulmonary disease, unspecified: Secondary | ICD-10-CM

## 2012-12-21 DIAGNOSIS — J4489 Other specified chronic obstructive pulmonary disease: Secondary | ICD-10-CM

## 2012-12-21 DIAGNOSIS — M549 Dorsalgia, unspecified: Secondary | ICD-10-CM

## 2012-12-21 DIAGNOSIS — E669 Obesity, unspecified: Secondary | ICD-10-CM

## 2012-12-21 DIAGNOSIS — G8929 Other chronic pain: Secondary | ICD-10-CM

## 2012-12-21 DIAGNOSIS — K219 Gastro-esophageal reflux disease without esophagitis: Secondary | ICD-10-CM

## 2012-12-21 HISTORY — DX: Other specified chronic obstructive pulmonary disease: J44.89

## 2012-12-21 HISTORY — DX: Chronic obstructive pulmonary disease, unspecified: J44.9

## 2012-12-21 MED ORDER — AMLODIPINE BESYLATE 5 MG PO TABS: 5.0000 mg | ORAL_TABLET | Freq: Every day | ORAL | Status: DC

## 2012-12-21 MED ORDER — TRAZODONE HCL 50 MG PO TABS: 25.0000 mg | ORAL_TABLET | Freq: Every evening | ORAL | Status: DC | PRN

## 2012-12-21 MED ORDER — DABIGATRAN ETEXILATE MESYLATE 150 MG PO CAPS: 150.0000 mg | ORAL_CAPSULE | Freq: Two times a day (BID) | ORAL | Status: DC

## 2012-12-21 MED ORDER — NAPROXEN SODIUM 220 MG PO TABS: 220.0000 mg | ORAL_TABLET | Freq: Two times a day (BID) | ORAL | Status: DC

## 2012-12-21 NOTE — Progress Notes (Signed)
Patient ID: Kyle [name], male   DOB: August 13, 1955, 57 y.o.   MRN: [id number]  Allergies  Allergen Reactions  . Vioxx (Rofecoxib)     "BAD RASH-LIKE RED RASH"   Chief Complaint  Patient presents with  . Medical Managment of Chronic Issues   HPI-  He has been having dry cough for a week. No runny nose , sore throat, fever or chills. No ear aches, bodyaches. He has quit smoking for a year now. He feels "some rattling in my chest" these days  bp elevated again this visit. Denies any headache, chest pain, SOB, urinary complaints, abdominal pain  Reflux is under control with protonix  Chronic back pain is under control with roxicodone at current regimen  Continues to take naproxen 440 mg bid for muscle pain/ spasm  His insurance is not going to cover his xanax anymore. He takes it for sleep and anxiety  Provides history of afib for 4-5 years and had been on coumadin for a year which was self discontinued 4 months back as he ran out of it and was not able to make further cardiology referral.    Review of Systems  Constitutional: Negative for fever, chills and fatigue.  HENT: Negative for congestion and postnasal drip.   Eyes: Negative.   Respiratory: Negative for shortness of breath.   Cardiovascular: Negative for chest pain, palpitations and leg swelling.  Gastrointestinal: Negative for abdominal pain.  Genitourinary: Negative for dysuria and frequency.  Musculoskeletal: Negative for acute back pain and gait problem.  Neurological: Negative for dizziness, weakness and numbness.  Hematological: Negative for adenopathy.  Psychiatric/Behavioral: Negative for confusion and agitation.   BP 154/88  Pulse 75  Temp(Src) 98 F (36.7 C) (Oral)  Resp 14  Ht 6' 5.8" (1.976 m)  Wt 362 lb 12.8 oz (164.565 kg)  BMI 42.15 kg/m2  Constitutional: He is oriented to person, place, and time. No distress.  obese  HENT:  Head: Normocephalic and atraumatic.   Mouth/Throat: Oropharynx is clear  and moist.  Eyes: Conjunctivae and EOM are normal. Pupils are equal, round, and reactive to light.  Neck: Normal range of motion. Neck supple. No thyromegaly present.  Cardiovascular: irregular rate, no murmurs Pulmonary/Chest: Effort normal and breath sounds normal.  Abdominal: Soft. Bowel sounds are normal.  Musculoskeletal: Normal range of motion. He exhibits no edema.  Neurological: He is alert and oriented to person, place, and time.  Skin: Skin is warm and dry. He is not diaphoretic.  Psychiatric: He has a normal mood and affect.   LABS- CBC    Component Value Date/Time   WBC 6.8 12/15/2012 0915   WBC 10.7* 10/21/2011 0530   RBC 4.92 12/15/2012 0915   RBC 4.93 10/21/2011 0530   HGB 14.3 12/15/2012 0915   HCT 42.5 12/15/2012 0915   PLT 228 10/21/2011 0530   MCV 86 12/15/2012 0915   MCH 29.1 12/15/2012 0915   MCH 28.4 10/21/2011 0530   MCHC 33.6 12/15/2012 0915   MCHC 32.9 10/21/2011 0530   RDW 15.1 12/15/2012 0915   RDW 16.1* 10/21/2011 0530   LYMPHSABS 1.3 12/15/2012 0915   LYMPHSABS 1.4 10/21/2011 0530   MONOABS 0.9 10/21/2011 0530   EOSABS 0.1 12/15/2012 0915   EOSABS 0.0 10/21/2011 0530   BASOSABS 0.0 12/15/2012 0915   BASOSABS 0.0 10/21/2011 0530    CMP     Component Value Date/Time   NA 140 12/15/2012 0915   NA 137 10/13/2011 0930   K 4.1 12/15/2012 0915  CL 100 12/15/2012 0915   CO2 26 12/15/2012 0915   GLUCOSE 75 12/15/2012 0915   GLUCOSE 74 10/13/2011 0930   BUN 13 12/15/2012 0915   BUN 15 10/13/2011 0930   CREATININE 0.81 12/15/2012 0915   CREATININE 0.75 06/20/2011 1032   CALCIUM 9.5 12/15/2012 0915   PROT 6.6 12/15/2012 0915   PROT 7.4 10/13/2011 0930   ALBUMIN 3.7 10/13/2011 0930   AST 17 12/15/2012 0915   ALT 9 12/15/2012 0915   ALKPHOS 84 12/15/2012 0915   BILITOT 0.6 12/15/2012 0915   GFRNONAA 100 12/15/2012 0915   GFRAA 116 12/15/2012 0915   Lipid Panel     Component Value Date/Time   CHOL 159 06/20/2011 1032   TRIG 61 12/15/2012 0915   HDL 33* 12/15/2012 0915   HDL 30* 06/20/2011 1032   CHOLHDL 4.2 12/15/2012  0915   CHOLHDL 5.3 06/20/2011 1032   VLDL 14 06/20/2011 1032   LDLCALC 94 12/15/2012 0915   LDLCALC 115* 06/20/2011 1032   ASSESSMENT/PLAN  HTN Persistently elevated bp readings. Will start him on amlodipine 5 mg daily for now, common side effects explained. Will reassess him in 1 month in office  Afib Not on any rate controlling agent ever as per patient. Currently HR under control. Had been on warfarin for a year which he self discontinued. Willing to restart anticoagulation. Has CHADS of 1 at present. Will have him on pradaxa 150 mg bid for now for stroke prophylaxis. Have decreased dose of nabumetone and to discontinue it next visit as this can increase risk for bleeding in patients on pradaxa.   GERD (gastroesophageal reflux disease) Continue PPI at current dose and monitor  Anxiety state, unspecified Well controlled with current regimen but due to insurance issue, will d/c xanax and have him on trazodone 25-50 mg daily as needed at bedtime. monitor  Back pain, chronic Well controlled on oxycontin and nabumetone. Will decrease nabumetone to 220 ng bid for now and consider weaning him off this next visit. Back precautions  Obesity hypoventilation syndrome Not using cpap currently. He has had sleep study but not the mask fit study done. He will schedule the appointment. I also have concerns for COPD. Will reassess him next visit with chest xray and possible PFT study

## 2012-12-22 ENCOUNTER — Other Ambulatory Visit: Payer: Self-pay | Admitting: Internal Medicine

## 2012-12-22 ENCOUNTER — Telehealth: Payer: Self-pay | Admitting: Geriatric Medicine

## 2012-12-22 NOTE — Telephone Encounter (Signed)
Patient called and said that he was supposed be prescribed an albuterol inhaler and the pharmacy did not receive the fax. Do you remember anything about this. I looked at the office note and I didn't see anything about it. Please advise,thanks.

## 2012-12-22 NOTE — Telephone Encounter (Signed)
i did talk with the patient about it but given multiple new medications that were started yesterday, i want to hold it off until I assess him further next visit.

## 2012-12-23 NOTE — Telephone Encounter (Signed)
Notified patient. Patient agreed.

## 2013-01-03 ENCOUNTER — Other Ambulatory Visit: Payer: Self-pay | Admitting: Geriatric Medicine

## 2013-01-04 ENCOUNTER — Other Ambulatory Visit: Payer: Self-pay | Admitting: Geriatric Medicine

## 2013-01-04 MED ORDER — OXYCODONE HCL 30 MG PO TABS: ORAL_TABLET | ORAL | Status: DC

## 2013-01-25 ENCOUNTER — Ambulatory Visit: Payer: Medicare Other | Admitting: Internal Medicine

## 2013-01-30 ENCOUNTER — Encounter: Payer: Self-pay | Admitting: *Deleted

## 2013-01-31 ENCOUNTER — Ambulatory Visit (INDEPENDENT_AMBULATORY_CARE_PROVIDER_SITE_OTHER): Payer: Medicare Other | Admitting: Internal Medicine

## 2013-01-31 ENCOUNTER — Encounter: Payer: Self-pay | Admitting: Internal Medicine

## 2013-01-31 VITALS — BP 148/86 | HR 71 | Temp 98.0°F | Resp 14 | Ht 77.8 in | Wt 367.0 lb

## 2013-01-31 DIAGNOSIS — G4700 Insomnia, unspecified: Secondary | ICD-10-CM

## 2013-01-31 DIAGNOSIS — I10 Essential (primary) hypertension: Secondary | ICD-10-CM

## 2013-01-31 DIAGNOSIS — E662 Morbid (severe) obesity with alveolar hypoventilation: Secondary | ICD-10-CM

## 2013-01-31 DIAGNOSIS — J438 Other emphysema: Secondary | ICD-10-CM | POA: Insufficient documentation

## 2013-01-31 DIAGNOSIS — K219 Gastro-esophageal reflux disease without esophagitis: Secondary | ICD-10-CM

## 2013-01-31 DIAGNOSIS — I4891 Unspecified atrial fibrillation: Secondary | ICD-10-CM | POA: Diagnosis not present

## 2013-01-31 MED ORDER — OXYCODONE HCL 30 MG PO TABS: ORAL_TABLET | ORAL | Status: DC

## 2013-01-31 MED ORDER — AMLODIPINE BESYLATE 5 MG PO TABS: 10.0000 mg | ORAL_TABLET | Freq: Every day | ORAL | Status: DC

## 2013-01-31 MED ORDER — TRAZODONE HCL 50 MG PO TABS: 75.0000 mg | ORAL_TABLET | Freq: Every evening | ORAL | Status: DC | PRN

## 2013-01-31 NOTE — Progress Notes (Signed)
Patient ID: Kyle [name], male   DOB: 06/14/1956, 57 y.o.   MRN: [id number]  Chief Complaint  Patient presents with  . Medical Managment of Chronic Issues    Complains of still not sleeping well   Allergies  Allergen Reactions  . Vioxx [Rofecoxib]     "BAD RASH-LIKE RED RASH"    HPI-  He is here for follow up. He is taking his pradaxa for afib and tolerating it well. No bleed reported. He is still having trouble staying asleep. He is able to fall asleep. He is not using any CPAP machine.  His breathing is fine except for moderate exertion BP better controlled but SBP still slightly elevated Reflux is under control with protonix Chronic back pain is under control with roxicodone at current regimen Has been taking decreased dose of naproxen  Review of Systems   Constitutional: Negative for fever, chills and fatigue.   HENT: Negative for congestion and postnasal drip.    Eyes: Negative.    Respiratory: Negative for shortness of breath.    Cardiovascular: Negative for chest pain, palpitations and leg swelling.   Gastrointestinal: Negative for abdominal pain.   Genitourinary: Negative for dysuria and frequency.   Musculoskeletal: Negative for acute back pain and gait problem.   Neurological: Negative for dizziness, weakness and numbness.   Hematological: Negative for adenopathy.   Psychiatric/Behavioral: Negative for confusion and agitation.   Physical exam BP 148/86  Pulse 71  Temp(Src) 98 F (36.7 C) (Oral)  Resp 14  Ht 6' 5.8" (1.976 m)  Wt 367 lb (166.47 kg)  BMI 42.63 kg/m2  Constitutional: He is oriented to person, place, and time. No distress.  Obese. Face flushed HENT:  Head: Normocephalic and atraumatic.   Mouth/Throat: Oropharynx is clear and moist.  short neck Eyes: Conjunctivae and EOM are normal. Pupils are equal, round, and reactive to light.   Neck: Normal range of motion. Neck supple. No thyromegaly present.   Cardiovascular: irregular rate, no  murmurs Pulmonary/Chest: Effort normal. Has scattered rhonchi. No wheeze at present  Abdominal: Soft. Bowel sounds are normal.  Musculoskeletal: Normal range of motion. He exhibits no edema.  Neurological: He is alert and oriented to person, place, and time.   Skin: Skin is warm and dry. He is not diaphoretic.  Psychiatric: He has a normal mood and affect.   Labs-  CBC    Component Value Date/Time   WBC 6.8 12/15/2012 0915   WBC 10.7* 10/21/2011 0530   RBC 4.92 12/15/2012 0915   RBC 4.93 10/21/2011 0530   HGB 14.3 12/15/2012 0915   HCT 42.5 12/15/2012 0915   PLT 228 10/21/2011 0530   MCV 86 12/15/2012 0915   MCH 29.1 12/15/2012 0915   MCH 28.4 10/21/2011 0530   MCHC 33.6 12/15/2012 0915   MCHC 32.9 10/21/2011 0530   RDW 15.1 12/15/2012 0915   RDW 16.1* 10/21/2011 0530   LYMPHSABS 1.3 12/15/2012 0915   LYMPHSABS 1.4 10/21/2011 0530   MONOABS 0.9 10/21/2011 0530   EOSABS 0.1 12/15/2012 0915   EOSABS 0.0 10/21/2011 0530   BASOSABS 0.0 12/15/2012 0915   BASOSABS 0.0 10/21/2011 0530    CMP     Component Value Date/Time   NA 140 12/15/2012 0915   NA 137 10/13/2011 0930   K 4.1 12/15/2012 0915   CL 100 12/15/2012 0915   CO2 26 12/15/2012 0915   GLUCOSE 75 12/15/2012 0915   GLUCOSE 74 10/13/2011 0930   BUN 13 12/15/2012 0915  BUN 15 10/13/2011 0930   CREATININE 0.81 12/15/2012 0915   CREATININE 0.75 06/20/2011 1032   CALCIUM 9.5 12/15/2012 0915   PROT 6.6 12/15/2012 0915   PROT 7.4 10/13/2011 0930   ALBUMIN 3.7 10/13/2011 0930   AST 17 12/15/2012 0915   ALT 9 12/15/2012 0915   ALKPHOS 84 12/15/2012 0915   BILITOT 0.6 12/15/2012 0915   GFRNONAA 100 12/15/2012 0915   GFRAA 116 12/15/2012 0915   Lipid Panel     Component Value Date/Time   CHOL 159 06/20/2011 1032   TRIG 61 12/15/2012 0915   HDL 33* 12/15/2012 0915   HDL 30* 06/20/2011 1032   CHOLHDL 4.2 12/15/2012 0915   CHOLHDL 5.3 06/20/2011 1032   VLDL 14 06/20/2011 1032   LDLCALC 94 12/15/2012 0915   LDLCALC 115* 06/20/2011 1032    Assessment/plan    Essential HTN  elevated systolic bp  readings. Will increase amlodipine to 10 mg daily for now, common side effects explained. Will reassess him in 2 month in office. Cut down on salt intake. Exercise encouraged  Afib Rate controlled. Continue pradaxa for secondary stroke prophylaxis.   Emphysema Is a smoker. Concerns for copd changes. Will get cxr to assess for chronic bronchitis/ emphysema changes. Pulmonary referral for PFT study. Continue proventil for now  Obesity hypoventilation syndrome Not using cpap currently. Will need repeat sleep study and possibly nasal mask for CPAP given his hx of claustrophobia  GERD (gastroesophageal reflux disease) Continue PPI at current dose and monitor  anxiety Will increase trazodone to 75 mg daily for now and reassess  Insomnia Problem staying asleep, likely has OSA. For now have increased his trazodone to 75 mg daily. Will need sleep study and CPAP machine to benefit from  Back pain, chronic Well controlled on oxycontin. Refills provided. To discontinue nabumetone. Back precautions

## 2013-02-07 ENCOUNTER — Ambulatory Visit: Payer: Medicare Other | Admitting: Internal Medicine

## 2013-02-08 ENCOUNTER — Institutional Professional Consult (permissible substitution): Payer: Medicare Other | Admitting: Internal Medicine

## 2013-03-01 ENCOUNTER — Other Ambulatory Visit: Payer: Self-pay | Admitting: *Deleted

## 2013-03-01 MED ORDER — OXYCODONE HCL 30 MG PO TABS: ORAL_TABLET | ORAL | Status: DC

## 2013-03-28 ENCOUNTER — Other Ambulatory Visit: Payer: Self-pay | Admitting: *Deleted

## 2013-03-30 ENCOUNTER — Other Ambulatory Visit: Payer: Self-pay | Admitting: *Deleted

## 2013-03-30 MED ORDER — OXYCODONE HCL 30 MG PO TABS: ORAL_TABLET | ORAL | Status: DC

## 2013-04-04 ENCOUNTER — Ambulatory Visit: Payer: Medicare Other | Admitting: Internal Medicine

## 2013-04-12 ENCOUNTER — Ambulatory Visit
Admission: RE | Admit: 2013-04-12 | Discharge: 2013-04-12 | Disposition: A | Discharge status: Home / Self Care | Payer: Medicare Other | Source: Ambulatory Visit | Attending: Internal Medicine | Admitting: Internal Medicine

## 2013-04-12 DIAGNOSIS — R0602 Shortness of breath: Secondary | ICD-10-CM | POA: Diagnosis not present

## 2013-04-12 DIAGNOSIS — J438 Other emphysema: Secondary | ICD-10-CM

## 2013-04-12 IMAGING — CR DG CHEST 2V
3 series · 3 of 3 positions shown · non-contrast
Comparison: Chest x-ray of [DATE]

CLINICAL DATA: Long times smoking history, shortness of breath

EXAM:
CHEST  2 VIEW

[view not recorded (1 of 3)]
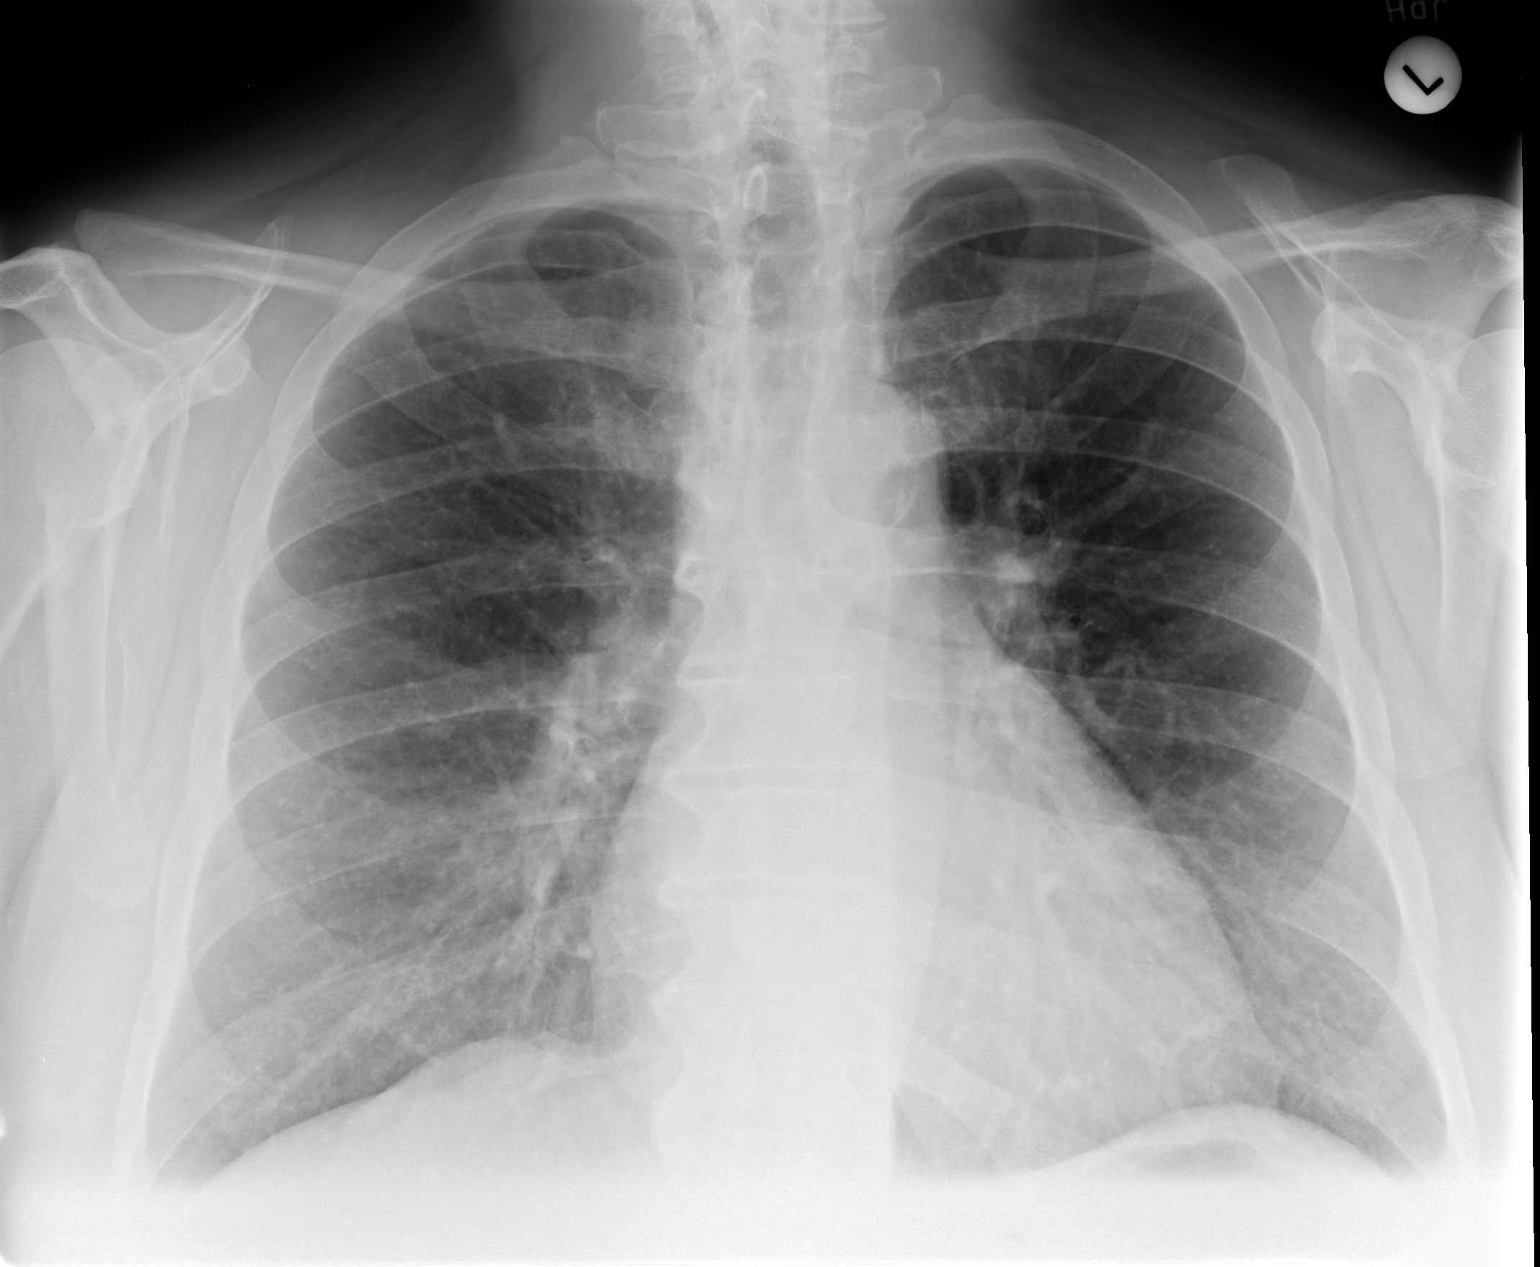

[view not recorded (2 of 3)]
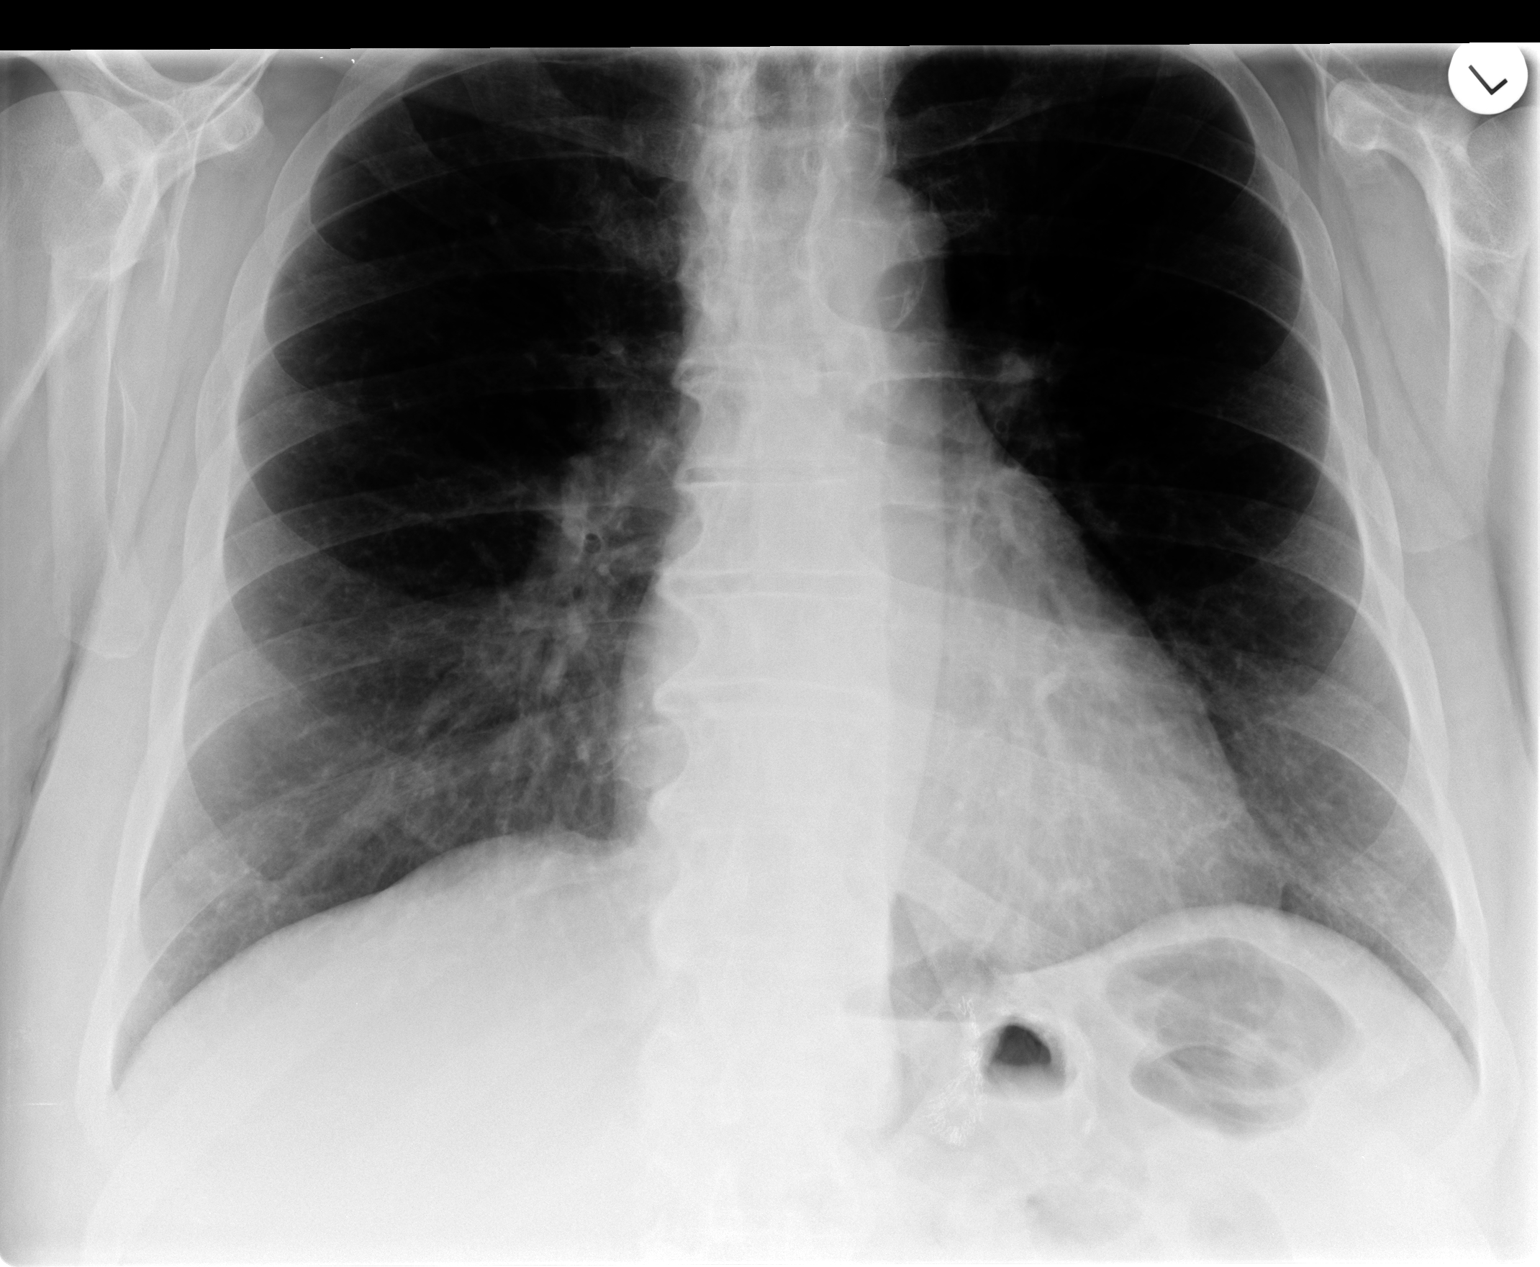

[view not recorded (3 of 3)]
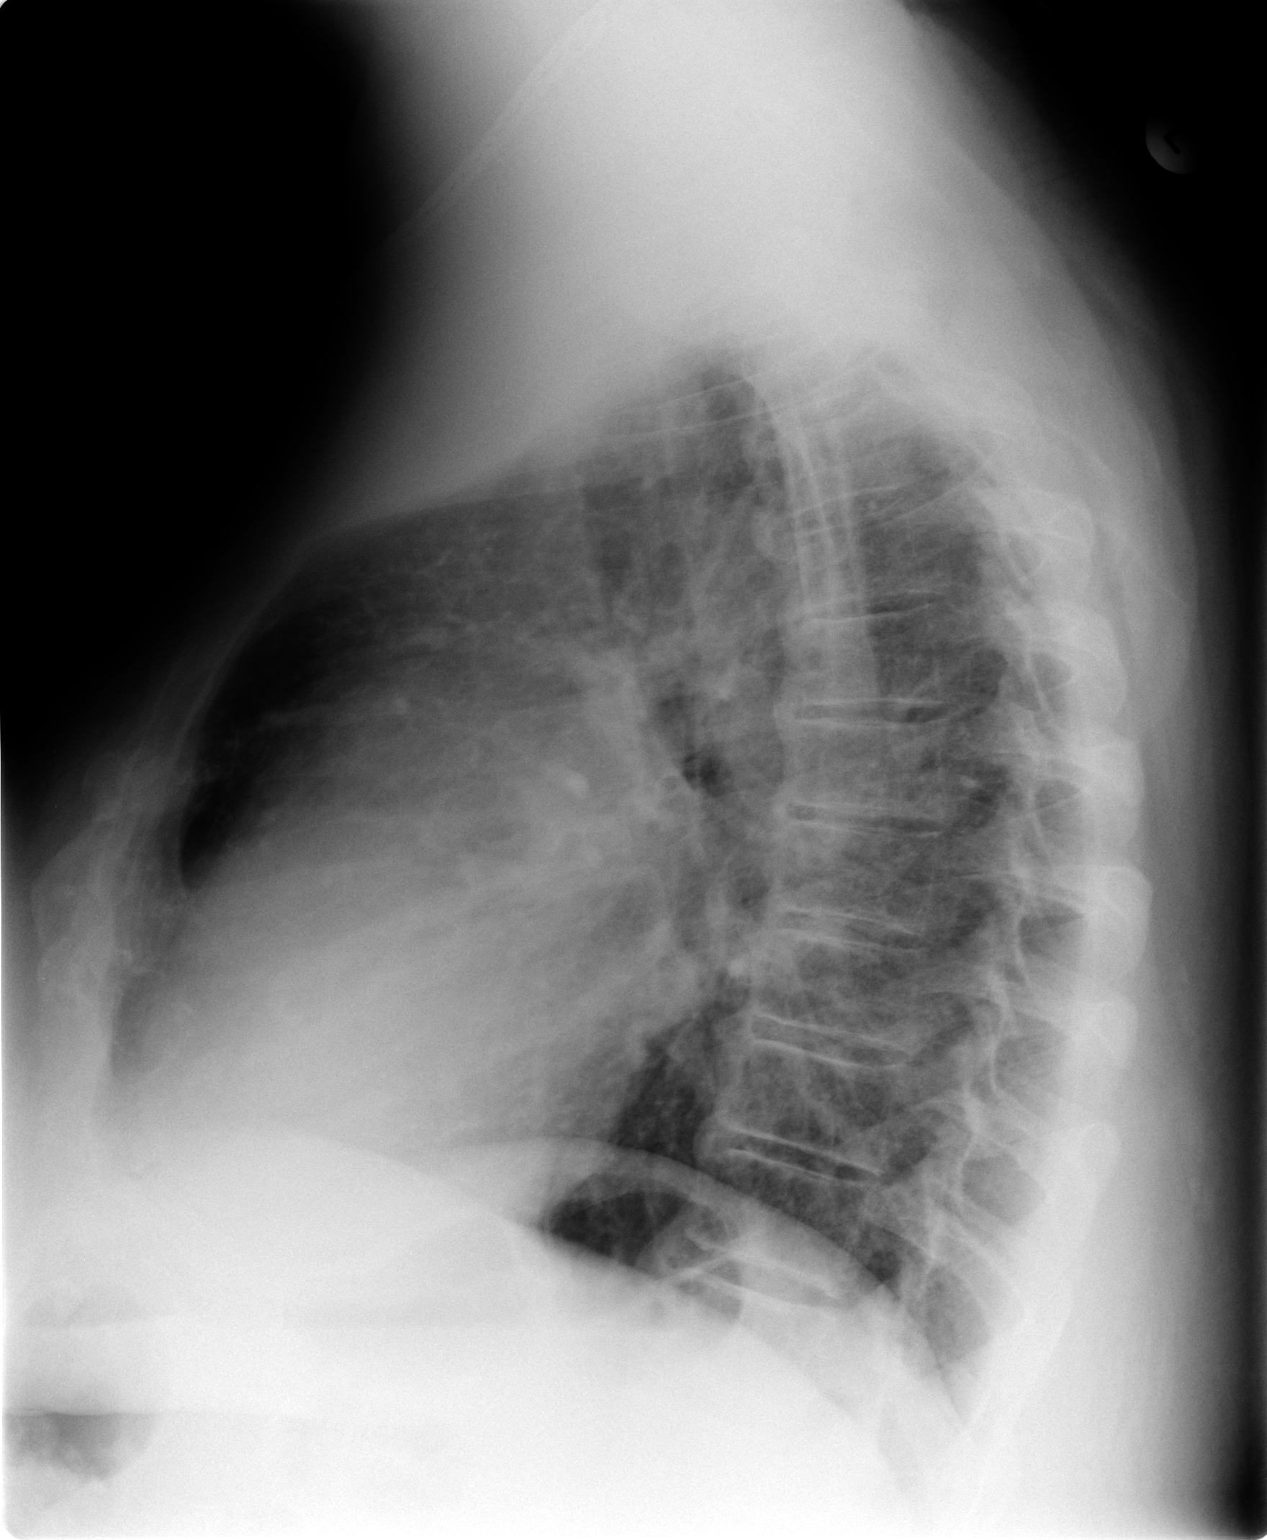

[3 of 3 positions shown; findings below may reference images not displayed]

FINDINGS: No active infiltrate or effusion is seen. The lungs remain
hyperaerated consistent with emphysema. Mediastinal contours are
stable. Mild cardiomegaly is stable. There are degenerative changes
throughout the thoracic spine.
IMPRESSION: Emphysema. No active lung disease. Stable mild cardiomegaly.

## 2013-04-19 ENCOUNTER — Encounter: Payer: Self-pay | Admitting: Internal Medicine

## 2013-04-19 ENCOUNTER — Ambulatory Visit (INDEPENDENT_AMBULATORY_CARE_PROVIDER_SITE_OTHER): Payer: Medicare Other | Admitting: Internal Medicine

## 2013-04-19 VITALS — BP 132/80 | HR 69 | Temp 97.2°F | Wt 372.0 lb

## 2013-04-19 DIAGNOSIS — I10 Essential (primary) hypertension: Secondary | ICD-10-CM | POA: Diagnosis not present

## 2013-04-19 DIAGNOSIS — J449 Chronic obstructive pulmonary disease, unspecified: Secondary | ICD-10-CM

## 2013-04-19 DIAGNOSIS — E662 Morbid (severe) obesity with alveolar hypoventilation: Secondary | ICD-10-CM

## 2013-04-19 DIAGNOSIS — M549 Dorsalgia, unspecified: Secondary | ICD-10-CM

## 2013-04-19 DIAGNOSIS — K219 Gastro-esophageal reflux disease without esophagitis: Secondary | ICD-10-CM

## 2013-04-19 DIAGNOSIS — R7301 Impaired fasting glucose: Secondary | ICD-10-CM

## 2013-04-19 DIAGNOSIS — E785 Hyperlipidemia, unspecified: Secondary | ICD-10-CM

## 2013-04-19 DIAGNOSIS — I4891 Unspecified atrial fibrillation: Secondary | ICD-10-CM

## 2013-04-19 DIAGNOSIS — J4489 Other specified chronic obstructive pulmonary disease: Secondary | ICD-10-CM

## 2013-04-19 DIAGNOSIS — G8929 Other chronic pain: Secondary | ICD-10-CM

## 2013-04-19 DIAGNOSIS — G4700 Insomnia, unspecified: Secondary | ICD-10-CM

## 2013-04-19 HISTORY — DX: Impaired fasting glucose: R73.01

## 2013-04-19 MED ORDER — METOPROLOL SUCCINATE ER 25 MG PO TB24: 25.0000 mg | ORAL_TABLET | Freq: Every day | ORAL | Status: DC

## 2013-04-19 NOTE — Progress Notes (Signed)
Patient ID: Kyle [name], male   DOB: 05-21-1956, 57 y.o.   MRN: [id number]  Chief Complaint  Patient presents with  . Medical Managment of Chronic Issues    2 Month follow-up    Allergies  Allergen Reactions  . Vioxx [Rofecoxib]     "BAD RASH-LIKE RED RASH"   HPI 57 y/o male pt here for routine visit. He is tolerating pradaxa well.  He has been smoking. Has audible wheezing with cough for 2-3 days. Denies SOB Has been having problem with sleep and feeling tired in the morning and sleepy  ROS No fever No chills No runny nose No sore throat No headache Denies sinus stuffiness No nausea or vomiting Regular bowel movement No urinary complaints No seizures, LOC, focal weakness  Past Medical History  Diagnosis Date  . GERD (gastroesophageal reflux disease)   . Shortness of breath   . Arthritis   . Wears dentures   . Atrial fibrillation     HX OF SICK SINUS SYNDROME-ATRIAL FIB  . 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 Peripheral vascular disease     CHRONIC VENOUS INSUFFICIENCY  . Back pain, chronic     PT STATES 5 BULGING DISKS AND PINCHED NERVES--PT ON OXYCODONE 4 TIMES A DAY FOR HIS PAIN   Medication reviewed. See [facility]  Physical exam  BP 132/80  Pulse 69  Temp(Src) 97.2 F (36.2 C) (Oral)  Wt 372 lb (168.738 kg)  SpO2 98%  Constitutional: He is oriented to person, place, and time. No distress.  Obese. Face flushed HENT:  Head: Normocephalic and atraumatic.   Mouth/Throat: Oropharynx is clear and moist.  short neck Eyes: Conjunctivae and EOM are normal. Pupils are equal, round, and reactive to light.   Neck: Normal range of motion. Neck supple. No thyromegaly present.   Cardiovascular: irregular rate, no murmurs Pulmonary/Chest: Effort normal. Has scattered expiratory wheeze  Abdominal: Soft. Bowel sounds are normal.  Musculoskeletal: Normal range of motion. He exhibits no edema.   Neurological: He is alert and oriented to person, place, and time.   Skin: Skin is warm and dry. He is not diaphoretic.  Psychiatric: He has a normal mood and affect.   Labs- CBC    Component Value Date/Time   WBC 6.8 12/15/2012 0915   WBC 10.7* 10/21/2011 0530   RBC 4.92 12/15/2012 0915   RBC 4.93 10/21/2011 0530   HGB 14.3 12/15/2012 0915   HCT 42.5 12/15/2012 0915   PLT 228 10/21/2011 0530   MCV 86 12/15/2012 0915   MCH 29.1 12/15/2012 0915   MCH 28.4 10/21/2011 0530   MCHC 33.6 12/15/2012 0915   MCHC 32.9 10/21/2011 0530   RDW 15.1 12/15/2012 0915   RDW 16.1* 10/21/2011 0530   LYMPHSABS 1.3 12/15/2012 0915   LYMPHSABS 1.4 10/21/2011 0530   MONOABS 0.9 10/21/2011 0530   EOSABS 0.1 12/15/2012 0915   EOSABS 0.0 10/21/2011 0530   BASOSABS 0.0 12/15/2012 0915   BASOSABS 0.0 10/21/2011 0530    CMP     Component Value Date/Time   NA 140 12/15/2012 0915   NA 137 10/13/2011 0930   K 4.1 12/15/2012 0915   CL 100 12/15/2012 0915   CO2 26 12/15/2012 0915   GLUCOSE 75 12/15/2012 0915   GLUCOSE 74 10/13/2011 0930   BUN 13 12/15/2012 0915   BUN 15 10/13/2011 0930   CREATININE 0.81 12/15/2012 0915  CREATININE 0.75 06/20/2011 1032   CALCIUM 9.5 12/15/2012 0915   PROT 6.6 12/15/2012 0915   PROT 7.4 10/13/2011 0930   ALBUMIN 3.7 10/13/2011 0930   AST 17 12/15/2012 0915   ALT 9 12/15/2012 0915   ALKPHOS 84 12/15/2012 0915   BILITOT 0.6 12/15/2012 0915   GFRNONAA 100 12/15/2012 0915   GFRAA 116 12/15/2012 0915    Assessmnet/plan  afib- on pradaxa for anticoagulation for secondary stroke prophylaxis. Not on any rate controlling agent. Will start him on toprol xl 25 mg daily and reassess in a month. Common side effects explained  HTN- will d/c amlodipine and start him on toprol to have both heart rate and bp controlled  OSA- concerns for sleep apnea. Pt mentions having sleep study in past and being prescribed CPAP but lost to follow up. Pt to contact sleep centre and get his CPAP fitted as per him  Emphysema and chronic bronchitis- is a smoker.   Continue proventil for now and pending PFT  GERD- Continue PPI at current dose and monitor  Insomnia- continue trazodone and will have reassessed and fitted for CPAP  Back pain, chronic- Well controlled on oxycontin. Back precautions  Morbid obesity- check a1c with hx of hyperglycemia in past. Dietary and exercise counselling.

## 2013-04-27 ENCOUNTER — Other Ambulatory Visit: Payer: Self-pay | Admitting: *Deleted

## 2013-04-27 MED ORDER — OXYCODONE HCL 30 MG PO TABS: ORAL_TABLET | ORAL | Status: DC

## 2013-05-09 ENCOUNTER — Other Ambulatory Visit: Payer: Medicare Other

## 2013-05-09 DIAGNOSIS — R7301 Impaired fasting glucose: Secondary | ICD-10-CM | POA: Diagnosis not present

## 2013-05-09 DIAGNOSIS — I4891 Unspecified atrial fibrillation: Secondary | ICD-10-CM

## 2013-05-09 DIAGNOSIS — E662 Morbid (severe) obesity with alveolar hypoventilation: Secondary | ICD-10-CM

## 2013-05-09 DIAGNOSIS — I10 Essential (primary) hypertension: Secondary | ICD-10-CM

## 2013-05-09 DIAGNOSIS — E785 Hyperlipidemia, unspecified: Secondary | ICD-10-CM

## 2013-05-10 LAB — BASIC METABOLIC PANEL
CO2: 26 mmol/L (ref 18–29)
Chloride: 99 mmol/L (ref 97–108)
GFR calc Af Amer: 112 mL/min/{1.73_m2} (ref >59)
Glucose: 91 mg/dL (ref 65–99)
Potassium: 4.5 mmol/L (ref 3.5–5.2)

## 2013-05-10 LAB — CBC WITH DIFFERENTIAL/PLATELET
Basophils Absolute: 0 10*3/uL (ref 0.0–0.2)
Basos: 0 %
Eos: 3 %
Eosinophils Absolute: 0.2 10*3/uL (ref 0.0–0.4)
Immature Grans (Abs): 0 10*3/uL (ref 0.0–0.1)
Immature Granulocytes: 0 %
Lymphs: 20 %
MCH: 30.3 pg (ref 26.6–33.0)
MCV: 92 fL (ref 79–97)
Monocytes Absolute: 0.5 10*3/uL (ref 0.1–0.9)
Monocytes: 8 %
Neutrophils Relative %: 69 %
RDW: 14.5 % (ref 12.3–15.4)
WBC: 5.8 10*3/uL (ref 3.4–10.8)

## 2013-05-10 LAB — HEMOGLOBIN A1C
Est. average glucose Bld gHb Est-mCnc: 111 mg/dL
Hgb A1c MFr Bld: 5.5 % (ref 4.8–5.6)

## 2013-05-16 ENCOUNTER — Other Ambulatory Visit: Payer: Self-pay | Admitting: Internal Medicine

## 2013-05-16 ENCOUNTER — Ambulatory Visit (INDEPENDENT_AMBULATORY_CARE_PROVIDER_SITE_OTHER): Payer: Medicare Other | Admitting: Internal Medicine

## 2013-05-16 VITALS — BP 136/84 | HR 64 | Temp 97.4°F | Wt 390.4 lb

## 2013-05-16 DIAGNOSIS — I10 Essential (primary) hypertension: Secondary | ICD-10-CM

## 2013-05-16 DIAGNOSIS — I4891 Unspecified atrial fibrillation: Secondary | ICD-10-CM

## 2013-05-16 MED ORDER — OXYCODONE HCL 30 MG PO TABS: ORAL_TABLET | ORAL | Status: DC

## 2013-05-16 NOTE — Progress Notes (Signed)
Patient ID: Kyle [name], male   DOB: August 13, 1955, 57 y.o.   MRN: [id number]  Chief Complaint  Patient presents with  . Medical Managment of Chronic Issues    1 month follow-up    HPI 58 y/o male pt here for follow up after being on toprol. He has hx of afib and was not on any rate controlling agent. He was started on toporol a month back and has tolerated it well. He is also taking pradaxa and no complication noted.  He had sleep study and is awaiting CPAP machine He has been smoking.   Review of Systems  Constitutional: Negative for fever, chills, weight loss, malaise/fatigue and diaphoresis.  HENT: Negative for congestion, hearing loss and sore throat.   Respiratory: Negative for cough, sputum production, shortness of breath and wheezing.   Cardiovascular: Negative for chest pain, palpitations, orthopnea and leg swelling.  Gastrointestinal: Negative for heartburn, nausea, vomiting, abdominal pain, diarrhea and constipation.  Genitourinary: Negative for dysuria, urgency, frequency and flank pain.  Musculoskeletal: Negative for back pain, falls, joint pain and myalgias.  Skin: Negative for itching and rash.  Neurological:  Negative for dizziness, tingling, focal weakness and headaches.  Psychiatric/Behavioral: Negative for depression and memory loss. The patient is not nervous/anxious.    Physical exam  BP 136/84  Pulse 64  Temp(Src) 97.4 F (36.3 C) (Oral)  Wt 390 lb 6.4 oz (177.084 kg)  SpO2 95%  Constitutional: He is oriented to person, place, and time. No distress.  Obese.  HENT:  Head: Normocephalic and atraumatic.   Mouth/Throat: Oropharynx is clear and moist.  short neck Eyes: Conjunctivae and EOM are normal. Pupils are equal, round, and reactive to light.   Neck: Normal range of motion. Neck supple. No thyromegaly present.   Cardiovascular: irregular rate, no murmurs Pulmonary/Chest: Effort normal. Has scattered expiratory wheeze   Abdominal: Soft. Bowel sounds are  normal.  Musculoskeletal: Normal range of motion. He exhibits no edema.  Neurological: He is alert and oriented to person, place, and time.   Skin: Skin is warm and dry. He is not diaphoretic.  Psychiatric: He has a normal mood and affect.    Assessment/plan  afib- tolerating toprol well. cotninue this for rate control and pradaxa for anticoagulation  Hypertension- continue toporol ad monitor bp readings

## 2013-06-12 ENCOUNTER — Other Ambulatory Visit: Payer: Self-pay | Admitting: Internal Medicine

## 2013-06-16 ENCOUNTER — Other Ambulatory Visit: Payer: Self-pay | Admitting: Internal Medicine

## 2013-06-19 ENCOUNTER — Other Ambulatory Visit: Payer: Self-pay | Admitting: Internal Medicine

## 2013-06-21 ENCOUNTER — Other Ambulatory Visit: Payer: Self-pay | Admitting: *Deleted

## 2013-06-22 ENCOUNTER — Other Ambulatory Visit: Payer: Self-pay | Admitting: *Deleted

## 2013-06-22 MED ORDER — OXYCODONE HCL 30 MG PO TABS: ORAL_TABLET | ORAL | Status: DC

## 2013-07-17 ENCOUNTER — Other Ambulatory Visit: Payer: Self-pay | Admitting: Internal Medicine

## 2013-07-20 ENCOUNTER — Other Ambulatory Visit: Payer: Self-pay | Admitting: *Deleted

## 2013-07-20 MED ORDER — OXYCODONE HCL 30 MG PO TABS: ORAL_TABLET | ORAL | Status: DC

## 2013-07-22 ENCOUNTER — Other Ambulatory Visit: Payer: Self-pay | Admitting: Internal Medicine

## 2013-08-16 ENCOUNTER — Other Ambulatory Visit: Payer: Self-pay | Admitting: *Deleted

## 2013-08-16 MED ORDER — OXYCODONE HCL 30 MG PO TABS: ORAL_TABLET | ORAL | Status: DC

## 2013-08-22 ENCOUNTER — Encounter: Payer: Self-pay | Admitting: Internal Medicine

## 2013-08-22 ENCOUNTER — Ambulatory Visit (INDEPENDENT_AMBULATORY_CARE_PROVIDER_SITE_OTHER): Payer: Medicare Other | Admitting: Internal Medicine

## 2013-08-22 VITALS — BP 140/78 | HR 60 | Temp 98.5°F | Resp 10 | Ht 74.08 in | Wt 372.6 lb

## 2013-08-22 DIAGNOSIS — G4733 Obstructive sleep apnea (adult) (pediatric): Secondary | ICD-10-CM

## 2013-08-22 DIAGNOSIS — G4700 Insomnia, unspecified: Secondary | ICD-10-CM

## 2013-08-22 DIAGNOSIS — I4891 Unspecified atrial fibrillation: Secondary | ICD-10-CM | POA: Diagnosis not present

## 2013-08-22 DIAGNOSIS — E785 Hyperlipidemia, unspecified: Secondary | ICD-10-CM

## 2013-08-22 DIAGNOSIS — M549 Dorsalgia, unspecified: Secondary | ICD-10-CM

## 2013-08-22 DIAGNOSIS — I10 Essential (primary) hypertension: Secondary | ICD-10-CM

## 2013-08-22 DIAGNOSIS — J438 Other emphysema: Secondary | ICD-10-CM

## 2013-08-22 DIAGNOSIS — K219 Gastro-esophageal reflux disease without esophagitis: Secondary | ICD-10-CM

## 2013-08-22 DIAGNOSIS — G8929 Other chronic pain: Secondary | ICD-10-CM

## 2013-08-22 DIAGNOSIS — Z9989 Dependence on other enabling machines and devices: Secondary | ICD-10-CM

## 2013-08-22 MED ORDER — TRAZODONE HCL 100 MG PO TABS: 100.0000 mg | ORAL_TABLET | Freq: Every day | ORAL | Status: DC

## 2013-08-22 NOTE — Progress Notes (Signed)
Patient ID: Kyle [name], male   DOB: 01-25-1956, 58 y.o.   MRN: [id number]    Chief Complaint  Patient presents with  . Medical Managment of Chronic Issues    3 month follow-up    Allergies  Allergen Reactions  . Vioxx [Rofecoxib]     "BAD RASH-LIKE RED RASH"   HPI 58 y/o male patient is here for routine visit.  Has started using the CPAP machine and mentions he feels more rested and has more energy during the day. The mask makes him claustrophobic and he is waiting for his nasal mask. Breathing has improved Has quit smoking for 17 days He has been more active and lost few pounds as well His pain in the back is under control with current medications He denies any complaints  Review of Systems  Constitutional: Negative for fever, chills, malaise/fatigue and diaphoresis.  HENT: Negative for congestion, hearing loss and sore throat.   Eyes: Negative for blurred vision, double vision and discharge.  Respiratory: Negative for cough, sputum production, shortness of breath and wheezing.   Cardiovascular: Negative for chest pain, palpitations, orthopnea and leg swelling.  Gastrointestinal: Negative for heartburn, nausea, vomiting, abdominal pain, diarrhea and constipation.  Genitourinary: Negative for dysuria, urgency, frequency and flank pain.  Musculoskeletal: Negative for back pain, falls, joint pain and myalgias.  Skin: Negative for itching and rash.  Neurological: Negative for dizziness, tingling, focal weakness and headaches.  Psychiatric/Behavioral: Negative for depression and memory loss. The patient is not nervous/anxious.    Past Medical History  Diagnosis Date  . GERD (gastroesophageal reflux disease)   . Shortness of breath   . Arthritis   . Wears dentures   . Atrial fibrillation     HX OF SICK SINUS SYNDROME-ATRIAL FIB  . 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 Peripheral vascular disease      CHRONIC VENOUS INSUFFICIENCY  . Back pain, chronic     PT STATES 5 BULGING DISKS AND PINCHED NERVES--PT ON OXYCODONE 4 TIMES A DAY FOR HIS PAIN   Current Outpatient Prescriptions on File Prior to Visit  Medication Sig Dispense Refill  . metoprolol succinate (TOPROL-XL) 25 MG 24 hr tablet TAKE 1 TABLET (25 MG TOTAL) BY MOUTH DAILY.  90 tablet  0  . oxycodone (ROXICODONE) 30 MG immediate release tablet Take one tablet by mouth every 4 hours as needed for pain  180 tablet  0  . pantoprazole (PROTONIX) 40 MG tablet TAKE 1 TABLET BY MOUTH EVERY DAY  30 tablet  6  . PRADAXA 150 MG CAPS capsule TAKE 1 CAPSULE BY MOUTH EVERY 12 HOURS  60 capsule  3  . PROAIR HFA 108 (90 BASE) MCG/ACT inhaler INHALE 1 PUFF EVERY 4 HOURS  8.5 each  3   No current facility-administered medications on file prior to visit.   Past Surgical History  Procedure Laterality Date  . Gastric restriction surgery  1992  . Leg surgery  1998    calcium deposit removed on right  leg   . Foot surgery  2003    right foot surgery from work accident   . Esophagogastroduodenoscopy  05/18/2011    Procedure: ESOPHAGOGASTRODUODENOSCOPY (EGD);  Surgeon: Shann Medal, MD;  Location: [facility];  Service: Endoscopy;  Laterality: N/A;  . Gastric bypass  10/19/11    Family History  Problem Relation Age of Onset  . Cancer Mother  melanoma  . Heart disease Mother   . Lung cancer Father     melanoma  . Heart disease Father   . Heart disease Sister   . Rheum arthritis Mother     Physical exam BP 140/78  Pulse 60  Temp(Src) 98.5 F (36.9 C) (Oral)  Resp 10  Ht 6' 2.08" (1.882 m)  Wt 372 lb 9.6 oz (169.01 kg)  BMI 47.72 kg/m2  SpO2 96%  Constitutional: He is oriented to person, place, and time. No distress. Obese.  HENT:  Head: Normocephalic and atraumatic.   Mouth/Throat: Oropharynx is clear and moist.  short neck Eyes: Conjunctivae and EOM are normal. Pupils are equal, round, and reactive to light.   Neck: Normal  range of motion. Neck supple. No thyromegaly present.   Cardiovascular: irregular rate, no murmurs Pulmonary/Chest: Effort normal, no wheezing or rhonchi, poor air entry at bases Abdominal: Soft. Bowel sounds are normal. Non tender Musculoskeletal: Normal range of motion. He exhibits no edema.  Neurological: He is alert and oriented to person, place, and time.   Skin: Skin is warm and dry. He is not diaphoretic.  Psychiatric: He has a normal mood and affect.   Labs- CBC    Component Value Date/Time   WBC 5.8 05/09/2013 0841   WBC 10.7* 10/21/2011 0530   RBC 4.66 05/09/2013 0841   RBC 4.93 10/21/2011 0530   HGB 14.1 05/09/2013 0841   HCT 42.7 05/09/2013 0841   PLT 228 10/21/2011 0530   MCV 92 05/09/2013 0841   MCH 30.3 05/09/2013 0841   MCH 28.4 10/21/2011 0530   MCHC 33.0 05/09/2013 0841   MCHC 32.9 10/21/2011 0530   RDW 14.5 05/09/2013 0841   RDW 16.1* 10/21/2011 0530   LYMPHSABS 1.2 05/09/2013 0841   LYMPHSABS 1.4 10/21/2011 0530   MONOABS 0.9 10/21/2011 0530   EOSABS 0.2 05/09/2013 0841   EOSABS 0.0 10/21/2011 0530   BASOSABS 0.0 05/09/2013 0841   BASOSABS 0.0 10/21/2011 0530    CMP     Component Value Date/Time   NA 142 05/09/2013 0841   NA 137 10/13/2011 0930   K 4.5 05/09/2013 0841   CL 99 05/09/2013 0841   CO2 26 05/09/2013 0841   GLUCOSE 91 05/09/2013 0841   GLUCOSE 74 10/13/2011 0930   BUN 11 05/09/2013 0841   BUN 15 10/13/2011 0930   CREATININE 0.88 05/09/2013 0841   CREATININE 0.75 06/20/2011 1032   CALCIUM 9.4 05/09/2013 0841   PROT 6.6 12/15/2012 0915   PROT 7.4 10/13/2011 0930   ALBUMIN 3.7 10/13/2011 0930   AST 17 12/15/2012 0915   ALT 9 12/15/2012 0915   ALKPHOS 84 12/15/2012 0915   BILITOT 0.6 12/15/2012 0915   GFRNONAA 97 05/09/2013 0841   GFRAA 112 05/09/2013 0841   Lab Results  Component Value Date   HGBA1C 5.5 05/09/2013    Assessment/plan  1. HTN (hypertension) Stable. Continue toprol - CMP; Future - CBC with Differential; Future  2. Afib tolerating toprol  well. continue this for rate control and pradaxa for anticoagulation  3. Other emphysema Continue proair. Has quit smoking.  4. GERD (gastroesophageal reflux disease) Continue protonix for now  5. Other and unspecified hyperlipidemia - Lipid Panel; Future  6. Insomnia Increased trazodone to 100 mg daily at bedtime. reassess  7. Back pain, chronic Well controlled on oxycontin. Back precautions  8. OSA on CPAP Continue CPAP for no  See in 3 months for annual physical

## 2013-09-14 ENCOUNTER — Other Ambulatory Visit: Payer: Self-pay | Admitting: *Deleted

## 2013-09-14 MED ORDER — OXYCODONE HCL 30 MG PO TABS: ORAL_TABLET | ORAL | Status: DC

## 2013-10-03 ENCOUNTER — Other Ambulatory Visit: Payer: Self-pay | Admitting: *Deleted

## 2013-10-03 MED ORDER — OXYCODONE HCL 30 MG PO TABS: ORAL_TABLET | ORAL | Status: DC

## 2013-10-03 NOTE — Telephone Encounter (Signed)
Patient is leaving early in the morning to go out of town on vacation (cruise) for 2 weeks. Needs Rx before he leaves because he will run out during vacation.

## 2013-10-11 ENCOUNTER — Other Ambulatory Visit: Payer: Self-pay | Admitting: Internal Medicine

## 2013-11-02 ENCOUNTER — Other Ambulatory Visit: Payer: Self-pay | Admitting: Internal Medicine

## 2013-11-02 ENCOUNTER — Other Ambulatory Visit: Payer: Self-pay

## 2013-11-02 MED ORDER — OXYCODONE HCL 30 MG PO TABS: ORAL_TABLET | ORAL | Status: DC

## 2013-11-17 ENCOUNTER — Other Ambulatory Visit: Payer: Medicare Other

## 2013-11-17 DIAGNOSIS — Z0000 Encounter for general adult medical examination without abnormal findings: Secondary | ICD-10-CM | POA: Diagnosis not present

## 2013-11-17 DIAGNOSIS — I10 Essential (primary) hypertension: Secondary | ICD-10-CM | POA: Diagnosis not present

## 2013-11-17 DIAGNOSIS — Z125 Encounter for screening for malignant neoplasm of prostate: Secondary | ICD-10-CM | POA: Diagnosis not present

## 2013-11-17 DIAGNOSIS — E785 Hyperlipidemia, unspecified: Secondary | ICD-10-CM

## 2013-11-18 LAB — CBC WITH DIFFERENTIAL/PLATELET
Basophils Absolute: 0 10*3/uL (ref 0.0–0.2)
Basos: 0 %
EOS: 5 %
Eosinophils Absolute: 0.3 10*3/uL (ref 0.0–0.4)
HCT: 41.8 % (ref 37.5–51.0)
Hemoglobin: 13.3 g/dL (ref 12.6–17.7)
IMMATURE GRANS (ABS): 0 10*3/uL (ref 0.0–0.1)
IMMATURE GRANULOCYTES: 0 %
Lymphocytes Absolute: 1.4 10*3/uL (ref 0.7–3.1)
Lymphs: 24 %
MCH: 27.3 pg (ref 26.6–33.0)
MCHC: 31.8 g/dL (ref 31.5–35.7)
MCV: 86 fL (ref 79–97)
Monocytes Absolute: 0.7 10*3/uL (ref 0.1–0.9)
Monocytes: 12 %
NEUTROS PCT: 59 %
Neutrophils Absolute: 3.5 10*3/uL (ref 1.4–7.0)
RBC: 4.88 x10E6/uL (ref 4.14–5.80)
RDW: 16.6 % — ABNORMAL HIGH (ref 12.3–15.4)
WBC: 5.9 10*3/uL (ref 3.4–10.8)

## 2013-11-18 LAB — COMPREHENSIVE METABOLIC PANEL
ALT: 11 IU/L (ref 0–44)
AST: 20 IU/L (ref 0–40)
Albumin/Globulin Ratio: 1.4 (ref 1.1–2.5)
Albumin: 3.9 g/dL (ref 3.5–5.5)
Alkaline Phosphatase: 100 IU/L (ref 39–117)
BUN / CREAT RATIO: 25 — AB (ref 9–20)
BUN: 18 mg/dL (ref 6–24)
CHLORIDE: 99 mmol/L (ref 97–108)
CO2: 26 mmol/L (ref 18–29)
Calcium: 9.1 mg/dL (ref 8.7–10.2)
Creatinine, Ser: 0.71 mg/dL — ABNORMAL LOW (ref 0.76–1.27)
GFR calc non Af Amer: 105 mL/min/{1.73_m2} (ref >59)
GFR, EST AFRICAN AMERICAN: 121 mL/min/{1.73_m2} (ref >59)
Globulin, Total: 2.7 g/dL (ref 1.5–4.5)
Glucose: 79 mg/dL (ref 65–99)
Potassium: 4.2 mmol/L (ref 3.5–5.2)
SODIUM: 142 mmol/L (ref 134–144)
Total Bilirubin: 0.4 mg/dL (ref 0.0–1.2)
Total Protein: 6.6 g/dL (ref 6.0–8.5)

## 2013-11-18 LAB — LIPID PANEL
Chol/HDL Ratio: 4.3 ratio units (ref 0.0–5.0)
Cholesterol, Total: 128 mg/dL (ref 100–199)
HDL: 30 mg/dL — AB (ref >39)
LDL Calculated: 84 mg/dL (ref 0–99)
Triglycerides: 71 mg/dL (ref 0–149)
VLDL Cholesterol Cal: 14 mg/dL (ref 5–40)

## 2013-11-21 ENCOUNTER — Encounter: Payer: Self-pay | Admitting: Internal Medicine

## 2013-11-21 ENCOUNTER — Ambulatory Visit (INDEPENDENT_AMBULATORY_CARE_PROVIDER_SITE_OTHER): Payer: Medicare Other | Admitting: Internal Medicine

## 2013-11-21 VITALS — BP 132/94 | HR 84 | Temp 98.4°F | Resp 20 | Ht 74.0 in | Wt 354.0 lb

## 2013-11-21 DIAGNOSIS — M549 Dorsalgia, unspecified: Secondary | ICD-10-CM

## 2013-11-21 DIAGNOSIS — I10 Essential (primary) hypertension: Secondary | ICD-10-CM

## 2013-11-21 DIAGNOSIS — Z1211 Encounter for screening for malignant neoplasm of colon: Secondary | ICD-10-CM | POA: Insufficient documentation

## 2013-11-21 DIAGNOSIS — G4733 Obstructive sleep apnea (adult) (pediatric): Secondary | ICD-10-CM | POA: Diagnosis not present

## 2013-11-21 DIAGNOSIS — Z9989 Dependence on other enabling machines and devices: Secondary | ICD-10-CM

## 2013-11-21 DIAGNOSIS — F411 Generalized anxiety disorder: Secondary | ICD-10-CM | POA: Diagnosis not present

## 2013-11-21 DIAGNOSIS — R21 Rash and other nonspecific skin eruption: Secondary | ICD-10-CM

## 2013-11-21 DIAGNOSIS — E669 Obesity, unspecified: Secondary | ICD-10-CM

## 2013-11-21 DIAGNOSIS — I4891 Unspecified atrial fibrillation: Secondary | ICD-10-CM | POA: Diagnosis not present

## 2013-11-21 DIAGNOSIS — J438 Other emphysema: Secondary | ICD-10-CM | POA: Diagnosis not present

## 2013-11-21 DIAGNOSIS — Z125 Encounter for screening for malignant neoplasm of prostate: Secondary | ICD-10-CM

## 2013-11-21 DIAGNOSIS — E785 Hyperlipidemia, unspecified: Secondary | ICD-10-CM

## 2013-11-21 DIAGNOSIS — Z0000 Encounter for general adult medical examination without abnormal findings: Secondary | ICD-10-CM

## 2013-11-21 DIAGNOSIS — K219 Gastro-esophageal reflux disease without esophagitis: Secondary | ICD-10-CM

## 2013-11-21 DIAGNOSIS — G8929 Other chronic pain: Secondary | ICD-10-CM

## 2013-11-21 DIAGNOSIS — Z7901 Long term (current) use of anticoagulants: Secondary | ICD-10-CM | POA: Diagnosis not present

## 2013-11-21 DIAGNOSIS — G4700 Insomnia, unspecified: Secondary | ICD-10-CM

## 2013-11-21 DIAGNOSIS — I119 Hypertensive heart disease without heart failure: Secondary | ICD-10-CM | POA: Diagnosis not present

## 2013-11-21 LAB — SPECIMEN STATUS REPORT

## 2013-11-21 MED ORDER — TIOTROPIUM BROMIDE MONOHYDRATE 18 MCG IN CAPS: 18.0000 ug | ORAL_CAPSULE | Freq: Every day | RESPIRATORY_TRACT | Status: DC

## 2013-11-21 MED ORDER — NYSTATIN-TRIAMCINOLONE 100000-0.1 UNIT/GM-% EX OINT: 1.0000 "application " | TOPICAL_OINTMENT | Freq: Two times a day (BID) | CUTANEOUS | Status: DC

## 2013-11-21 MED ORDER — TRAZODONE HCL 150 MG PO TABS
150.0000 mg | ORAL_TABLET | Freq: Every day | ORAL | Status: DC
Start: 2013-11-21 — End: 2014-03-19

## 2013-11-21 MED ORDER — PANTOPRAZOLE SODIUM 20 MG PO TBEC: 20.0000 mg | DELAYED_RELEASE_TABLET | Freq: Every day | ORAL | Status: DC

## 2013-11-21 MED ORDER — LISINOPRIL 5 MG PO TABS: 5.0000 mg | ORAL_TABLET | Freq: Every day | ORAL | Status: DC

## 2013-11-21 NOTE — Progress Notes (Signed)
Patient ID: Kyle [name], male   DOB: October 05, 1955, 58 y.o.   MRN: [id number]      Allergies  Allergen Reactions  . Vioxx [Rofecoxib]     "BAD RASH-LIKE RED RASH"    Chief Complaint  Patient presents with  . Annual Exam    HPI:  58 y/o male patient is here for his annual exam. He has history of emphysema, tobacco use, afib, insomnia and chronic back pain. Reviewed his labs with him His bp is elevated in officee today. His home bp SBP 130-145/ DBP 80-95. He continues to smoke but has cut down from a pack daily to a pack a week.  Walking for exercise on daily basis. Has lost weight since last visit. He has problem falling asleep. He is using his cpap machine  Review of Systems:  Constitutional: Negative for fever, chills, malaise/fatigue and diaphoresis.  HENT: Negative for congestion, hearing loss and sore throat.   Eyes: Negative for eye pain, blurred vision, double vision and discharge.  Respiratory: Negative for shortness of breath and wheezing.  has cough with clear phlegm production Cardiovascular: Negative for chest pain, palpitations, orthopnea and leg swelling.  Gastrointestinal: Negative for heartburn, nausea, vomiting, abdominal pain, diarrhea and constipation. reflux under control Genitourinary: Negative for dysuria, urgency, frequency, hematuria and flank pain.  Musculoskeletal: Negative for falls, joint pain and myalgias. has chronic back pain Skin: Negative for itching and rash.  Neurological: Negative for weakness,dizziness, tingling, focal weakness and headaches.  Psychiatric/Behavioral: Negative for depression and memory loss. The patient is not nervous/anxious.     Past Medical History  Diagnosis Date  . GERD (gastroesophageal reflux disease)   . Shortness of breath   . Arthritis   . Wears dentures   . Atrial fibrillation     HX OF SICK SINUS SYNDROME-ATRIAL FIB  . 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 Peripheral vascular disease     CHRONIC VENOUS INSUFFICIENCY  . Back pain, chronic     PT STATES 5 BULGING DISKS AND PINCHED NERVES--PT ON OXYCODONE 4 TIMES A DAY FOR HIS PAIN   Past Surgical History  Procedure Laterality Date  . Gastric restriction surgery  1992  . Leg surgery  1998    calcium deposit removed on right  leg   . Foot surgery  2003    right foot surgery from work accident   . Esophagogastroduodenoscopy  05/18/2011    Procedure: ESOPHAGOGASTRODUODENOSCOPY (EGD);  Surgeon: Shann Medal, MD;  Location: [facility];  Service: Endoscopy;  Laterality: N/A;  . Gastric bypass  10/19/11   Social History:   reports that he quit smoking about 2 years ago. He has never used smokeless tobacco. He reports that he drinks alcohol. He reports that he does not use illicit drugs.  Family History  Problem Relation Age of Onset  . Cancer Mother     melanoma  . Heart disease Mother   . Lung cancer Father     melanoma  . Heart disease Father   . Heart disease Sister   . Rheum arthritis Mother     Medications: Patient's Medications  New Prescriptions   No medications on file  Previous Medications   METOPROLOL SUCCINATE (TOPROL-XL) 25 MG 24 HR TABLET    TAKE 1 TABLET BY MOUTH EVERY DAY   OXYCODONE (ROXICODONE) 30 MG IMMEDIATE RELEASE TABLET    Take one tablet  by mouth every 4 hours as needed for pain   PANTOPRAZOLE (PROTONIX) 40 MG TABLET    TAKE 1 TABLET BY MOUTH EVERY DAY   PRADAXA 150 MG CAPS CAPSULE    TAKE ONE CAPSULE BY MOUTH EVERY 12 HOURS   PROAIR HFA 108 (90 BASE) MCG/ACT INHALER    INHALE 1 PUFF EVERY 4 HOURS   TRAZODONE (DESYREL) 100 MG TABLET    Take 1 tablet (100 mg total) by mouth at bedtime. Take one tablet daily at bedtime  Modified Medications   No medications on file  Discontinued Medications   TRAZODONE (DESYREL) 50 MG TABLET    TAKE 1 AND 1/2 TABLETS BY MOUTH EVERY DAY AT BEDTIME AS NEEDED FOR SLEEP   Filed Weights   11/21/13 0836    Weight: 354 lb (160.573 kg)   Immunization History  Administered Date(s) Administered  . Influenza-Unspecified 04/19/2013  . Td 06/15/2009    Physical Exam: Filed Vitals:   11/21/13 0836  BP: 132/94  Pulse: 84  Temp: 98.4 F (36.9 C)  TempSrc: Oral  Resp: 20  Height: 6\' 2"  (1.88 m)  Weight: 354 lb (160.573 kg)    General- adult male in no acute distress, obese Head- atraumatic, normocephalic Eyes- PERRLA, EOMI, no pallor, no icterus, no discharge Neck- no lymphadenopathy, no thyromegaly, no jugular vein distension, no carotid bruit Ears- left ear normal tympanic membrane and normal external ear canal , right ear normal tympanic membrane and normal external ear canal Throat- moist mucus membrane, normal oropharynx, has upper and lower dentures Nose- normal nasal mucosa, no maxillary or frontal sinus tenderness Chest- no chest wall deformities, no chest wall tenderness Breast- no masses, no palpable lumps, normal nipple and areola exam, no axillary lymphadenopathy, rash beneath both breast with erythema Cardiovascular- irregular heart rate, no murmurs/ rubs/ gallops, good intact distal pulses Respiratory- bilateral poor air entry with occassional expiratory wheeze, no rhonchi, no crackles, no use of accessory muscles Abdomen- bowel sounds present, soft, non tender, no organomegaly, no abdominal bruits, no guarding or rigidity, no CVA tenderness Musculoskeletal- able to move all 4 extremities, no spinal and paraspinal tenderness, steady gait, no use of assistive device, normal range of motion, no leg edema Neurological- no focal deficit, normal reflexes, normal muscle strength, normal sensation to fine touch and vibration Skin- warm and dry, varicose veins in both lower leg, chronic skin changes from venous stasis in both lower legs, 0.5 x 0.5 cm open wound in right lower anterior leg from trauma, healing well, no signs of infection Psychiatry- alert and oriented to person, place  and time, normal mood and affect   Labs reviewed: Basic Metabolic Panel:  Recent Labs  12/15/12 0915 05/09/13 0841 11/17/13 0835  NA 140 142 142  K 4.1 4.5 4.2  CL 100 99 99  CO2 26 26 26   GLUCOSE 75 91 79  BUN 13 11 18   CREATININE 0.81 0.88 0.71*  CALCIUM 9.5 9.4 9.1   Liver Function Tests:  Recent Labs  12/15/12 0915 11/17/13 0835  AST 17 20  ALT 9 11  ALKPHOS 84 100  BILITOT 0.6 0.4  PROT 6.6 6.6   No results found for this basename: LIPASE, AMYLASE,  in the last 8760 hours No results found for this basename: AMMONIA,  in the last 8760 hours CBC:  Recent Labs  12/15/12 0915 05/09/13 0841 11/17/13 0835  WBC 6.8 5.8 5.9  NEUTROABS 4.6 3.9 3.5  HGB 14.3 14.1 13.3  HCT 42.5 42.7 41.8  MCV 86 92 86   Lipid Panel     Component Value Date/Time   CHOL 159 06/20/2011 1032   TRIG 71 11/17/2013 0835   HDL 30* 11/17/2013 0835   HDL 30* 06/20/2011 1032   CHOLHDL 4.3 11/17/2013 0835   CHOLHDL 5.3 06/20/2011 1032   VLDL 14 06/20/2011 1032   LDLCALC 84 11/17/2013 0835   LDLCALC 115* 06/20/2011 1032    EKG: Independently reviewed. Afib, rate controlled 11/21/13  Assessment/Plan  Insomnia With continued problem falling asleep, will increase Trazodone to 150 mg daily from 100 mg daily and reassess  Hypertension bp has been elevated at home and in office. Continue metoprolol and add lisinopril 5 mg daily. Check blood pressure once a week at home. Reassess in 3 months  Routine physical exam the patient was counseled regarding the appropriate use of alcohol, prevention of dental and periodontal disease, diet, regular sustained exercise for at least 30 minutes 5 times per week, the proper use of sunscreen and protective clothing, tobacco use, and recommended schedule for GI hemoccult testing, colonoscopy, cholesterol, thyroid and diabetes screening.Office to schedule gi appointment. prevnar vaccine provided. counselling on stopping tobacco use provided.   GERD Improved, decrease  pantoprazole to 20 mg daily and monitor  Screening for prostate  Add PSA to lab  Emphysema Continues to smoke. With cough and occassional wheezing, continue prn proair and will add spiriva to help with maintenance therapy  Obesity Continue exercise and diet regimen  OSA Continue cpap  Back pain Continue oxycodone  Breast rash Likely fungal rash from sweat and moisture, advised to wash and keep area dry, also to apply nystatin-triamcinolone cream twice a day until resolved. Reassess if no improvement of rash  Hyperlipidemia Reviewed lipid panel, off all medication and ldl at goal  afib Rate controlled. Continue metoprolol and apixaban  Labs/tests ordered: add PSA  F/u in 3 months or earlier if needed  Blanchie Serve, MD  [facility] [phone] (Monday-Friday 8 am - 5 pm) [phone] (afterhours)     .

## 2013-11-21 NOTE — Patient Instructions (Addendum)
Trazodone has been changed to 150 mg daily from 100 mg daily to help with your sleep  New blood pressure medication lisinopril 5 mg daily has been added. Check blood pressure once a week at home  Office will call you with GI appointment for your colonoscopy  Continue eating healthy and continue your walking  Your reflux medicine has been reduced to 20 mg daily from 40 mg daily for now  New breathing treatment once a day for your lung spiriva sent to your pharmacy

## 2013-11-22 LAB — SPECIMEN STATUS REPORT

## 2013-11-22 LAB — PSA: PSA: 0.9 ng/mL (ref 0.0–4.0)

## 2013-11-24 ENCOUNTER — Ambulatory Visit (INDEPENDENT_AMBULATORY_CARE_PROVIDER_SITE_OTHER): Payer: Medicare Other

## 2013-11-24 ENCOUNTER — Ambulatory Visit (INDEPENDENT_AMBULATORY_CARE_PROVIDER_SITE_OTHER): Payer: Medicare Other | Admitting: Podiatry

## 2013-11-24 VITALS — BP 137/85 | HR 80 | Resp 16 | Ht 77.0 in | Wt 354.0 lb

## 2013-11-24 DIAGNOSIS — M2040 Other hammer toe(s) (acquired), unspecified foot: Secondary | ICD-10-CM

## 2013-11-24 DIAGNOSIS — M79609 Pain in unspecified limb: Secondary | ICD-10-CM | POA: Diagnosis not present

## 2013-11-24 DIAGNOSIS — B351 Tinea unguium: Secondary | ICD-10-CM | POA: Diagnosis not present

## 2013-11-24 NOTE — Progress Notes (Signed)
Subjective:     Patient ID: Kyle [name], male   DOB: [birth year], 58 y.o.   MRN: [id number]  HPI patient presents stating I have some bad hammertoes they get sore and my nails are thick and are impossible for me to cut. Patient states he has lost 200 pounds but the toes are making it hard for him to do the exercising he needs to do   Review of Systems  All other systems reviewed and are negative.      Objective:   Physical Exam  Nursing note and vitals reviewed. Constitutional: He is oriented to person, place, and time.  Cardiovascular: Intact distal pulses.   Musculoskeletal: Normal range of motion.  Neurological: He is oriented to person, place, and time.  Skin: Skin is warm.   neurovascular status intact with digits found to be well perfused and warm with significant digital contracture of 234 of both feet with redness and pain on the top of the toes when palpated. 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 Patient is in very good health and has just had a physical and was referred by his family physician and I do think he would do well with digital fusion and I explained this to him. He would like to have this done and tentatively we'll schedule him for right foot fusion of digits 234 with pin and we'll review this in 1 week. Today I debrided his nailbeds

## 2013-11-24 NOTE — Progress Notes (Signed)
   Subjective:    Patient ID: Kyle [name], male    DOB: 1956/03/13, 58 y.o.   MRN: [id number]  HPI Comments: My feet have been bothering me now for quite some time and i thought it was time to have them checked out , i have hammertoes. Also my toenails are real thick and painful hard to cut      Review of Systems  Respiratory: Positive for cough and shortness of breath.   Cardiovascular: Positive for leg swelling.  Musculoskeletal: Positive for back pain.       Joint pain  Difficulty walking   Skin: Positive for color change.       Thick scars        Objective:   Physical Exam        Assessment & Plan:

## 2013-11-29 ENCOUNTER — Other Ambulatory Visit: Payer: Self-pay | Admitting: *Deleted

## 2013-11-29 ENCOUNTER — Encounter: Payer: Self-pay | Admitting: Internal Medicine

## 2013-11-29 MED ORDER — OXYCODONE HCL 30 MG PO TABS: ORAL_TABLET | ORAL | Status: DC

## 2013-12-01 ENCOUNTER — Ambulatory Visit (INDEPENDENT_AMBULATORY_CARE_PROVIDER_SITE_OTHER): Payer: Medicare Other | Admitting: Podiatry

## 2013-12-01 ENCOUNTER — Encounter: Payer: Self-pay | Admitting: Podiatry

## 2013-12-01 DIAGNOSIS — M2040 Other hammer toe(s) (acquired), unspecified foot: Secondary | ICD-10-CM | POA: Diagnosis not present

## 2013-12-01 NOTE — Progress Notes (Signed)
Subjective:     Patient ID: Kyle [name], male   DOB: 07/11/1955, 58 y.o.   MRN: [id number]  HPI patient presents stating I am ready to get the toes fixed on my right foot and my weight continues to come down I've lost almost 300 pounds. Patient wants to get the left foot fixed afterwards but not to we complete the right foot   Review of Systems     Objective:   Physical Exam Neurovascular status is found to be intact with digits well perfused and warm with significant rigid contracture of digits 234 right over left foot    Assessment:     Chronic digital deformity making it painful and making it difficult for him to exercise which is an important part of his healing process as she continues to lose weight    Plan:     Reviewed the consent form with patient and explained digital fusion of digits 234 right foot and all possible complications as outlined in the consent form. Patient wants procedure and understands risk and understands total recovery. Can take 6 months to one year for these procedures and signs consent form at this time. Given all preoperative instructions and is scheduled for surgery in the next week and is encouraged to call if he should have any questions prior to procedure

## 2013-12-04 ENCOUNTER — Telehealth: Payer: Self-pay | Admitting: *Deleted

## 2013-12-04 NOTE — Telephone Encounter (Signed)
CALLED AND SPOKE WITH PT REGARDING SURGERY ON 6.23.15. PER DR REGAL AND ANESTHESIA PT NEEDS TO SEE CARDIOLOGIST BEFORE DOING GENERAL OR LOCAL SURGERY. PT STATED HE WILL NOT BE SEEING A CARDIOLOGIST AT THIS TIME. I TOLD PT SURGERY HAD TO BE CANCELLED FOR TOMORROW. PT UNDERSTOOD.

## 2013-12-04 NOTE — Telephone Encounter (Signed)
[place]. ANESTHESIA CANCELLED SURGERY TOMORROW 6.23.15 DUE TO PT COMPLAINING OF HBP AND CHEST PAIN AS OF LAST WEEK. PT STATES HIS MEDICATION HAS BEEN CHANGED AND IS NO LONGER HAVING THESE PROBLEMS. ANESTHESIA CONCERNED-NO ECHOGRAM OR STRESS TEST HAS BEEN DONE ON PT. DOES NOT WANT TO GO THRU RISK WITH GENERAL ANESTHESIA. I DID CALL PT AND HE STATED HE DOES NOT HAVE A CARDIOLOGIST AND THAT DR Blanchie Serve MD IS TREATING HIM. Lane TO USE LOCAL INSTEAD OF GENERAL? ANESTHESIA IS SAYING LOCAL SHOULD NOT BE A PROBLEM.

## 2013-12-04 NOTE — Telephone Encounter (Signed)
Calling regarding my foot surgery tomorrow.  I need to know if it's okay for me to continue my Pradaxa, which is a mild blood thinner or do I need to stop?  If somebody would give me a call.

## 2013-12-04 NOTE — Telephone Encounter (Signed)
Called and spoke with pt. Pt stated he not yet taken his Pradaxa today and i told him not to take it today or tomorrow and should be ok for surgery 6.23.15. Also pt stated he was told to be at surgery center at two different times. i told pt to call surgery center back and verify which time to be there. Pt understood.

## 2013-12-06 ENCOUNTER — Telehealth: Payer: Self-pay

## 2013-12-06 NOTE — Telephone Encounter (Signed)
Spoke with pt, he did not under go surgery because he needs clearance from his cardiologist first

## 2013-12-12 ENCOUNTER — Encounter: Payer: Self-pay | Admitting: Podiatry

## 2013-12-28 ENCOUNTER — Encounter: Payer: Self-pay | Admitting: Internal Medicine

## 2013-12-28 ENCOUNTER — Other Ambulatory Visit: Payer: Self-pay | Admitting: *Deleted

## 2013-12-28 MED ORDER — OXYCODONE HCL 30 MG PO TABS: ORAL_TABLET | ORAL | Status: DC

## 2013-12-28 NOTE — Telephone Encounter (Signed)
Patient Requested 

## 2014-01-18 ENCOUNTER — Other Ambulatory Visit: Payer: Self-pay | Admitting: Internal Medicine

## 2014-01-23 ENCOUNTER — Other Ambulatory Visit: Payer: Self-pay | Admitting: *Deleted

## 2014-01-23 MED ORDER — OXYCODONE HCL 30 MG PO TABS: ORAL_TABLET | ORAL | Status: DC

## 2014-01-23 NOTE — Telephone Encounter (Signed)
Patient called and stated that he has to pick up his Rx early because his fiancee's mother is not doing well and not expected to make it. Patient has to leave for [place] today to be with her. Printed and left up front for pickup.

## 2014-02-03 ENCOUNTER — Other Ambulatory Visit: Payer: Self-pay | Admitting: Internal Medicine

## 2014-02-09 ENCOUNTER — Other Ambulatory Visit: Payer: Self-pay | Admitting: Internal Medicine

## 2014-02-21 ENCOUNTER — Encounter: Payer: Self-pay | Admitting: Internal Medicine

## 2014-02-21 ENCOUNTER — Ambulatory Visit (INDEPENDENT_AMBULATORY_CARE_PROVIDER_SITE_OTHER): Payer: Medicare Other | Admitting: Internal Medicine

## 2014-02-21 VITALS — BP 148/84 | HR 64 | Temp 98.6°F | Ht 77.0 in | Wt 354.0 lb

## 2014-02-21 DIAGNOSIS — I4891 Unspecified atrial fibrillation: Secondary | ICD-10-CM

## 2014-02-21 DIAGNOSIS — I10 Essential (primary) hypertension: Secondary | ICD-10-CM

## 2014-02-21 DIAGNOSIS — K219 Gastro-esophageal reflux disease without esophagitis: Secondary | ICD-10-CM

## 2014-02-21 DIAGNOSIS — Z23 Encounter for immunization: Secondary | ICD-10-CM | POA: Diagnosis not present

## 2014-02-21 DIAGNOSIS — G8929 Other chronic pain: Secondary | ICD-10-CM

## 2014-02-21 DIAGNOSIS — Z1211 Encounter for screening for malignant neoplasm of colon: Secondary | ICD-10-CM

## 2014-02-21 DIAGNOSIS — J449 Chronic obstructive pulmonary disease, unspecified: Secondary | ICD-10-CM | POA: Diagnosis not present

## 2014-02-21 DIAGNOSIS — M549 Dorsalgia, unspecified: Secondary | ICD-10-CM

## 2014-02-21 DIAGNOSIS — I482 Chronic atrial fibrillation, unspecified: Secondary | ICD-10-CM

## 2014-02-21 MED ORDER — OXYCODONE HCL 30 MG PO TABS: ORAL_TABLET | ORAL | Status: DC

## 2014-02-21 MED ORDER — LISINOPRIL 10 MG PO TABS: 10.0000 mg | ORAL_TABLET | Freq: Every day | ORAL | Status: DC

## 2014-02-21 NOTE — Progress Notes (Signed)
Patient ID: Kyle [name], male   DOB: 1955-08-07, 58 y.o.   MRN: [id number]    Chief Complaint  Patient presents with  . Medical Management of Chronic Issues    3 month follow-up, no concerns   . Referral    Re-order Colonoscopy, never contacted for appointment  . Results    Discuss Genetic Test Results    Allergies  Allergen Reactions  . Vioxx [Rofecoxib]     "BAD RASH-LIKE RED RASH"   HPI:   58 y/o male patient is here for routine visit. He has history of HTN, COPD, chronic back pain, afib, insomnia  He has stopped smoking since June 1 week after being seen in clinic Using his cpap machine He lost his mother in law recently and feels stressed out bp readings at home:132-145/ 40-96 Sleeping well with trazodone No breathing problems recently Reviewed genetic testing results  Wt Readings from Last 3 Encounters:  02/21/14 354 lb (160.573 kg)  11/24/13 354 lb (160.573 kg)  11/21/13 354 lb (160.573 kg)   Review of Systems  Constitutional: Negative for fever, chills, weight loss, malaise/fatigue and diaphoresis.  HENT: Negative for congestion, hearing loss and sore throat.   Eyes: Negative for blurred vision, double vision and discharge.  Respiratory: Negative for cough, sputum production, shortness of breath and wheezing.   Cardiovascular: Negative for chest pain, palpitations, leg swelling.  Gastrointestinal: Negative for heartburn, nausea, vomiting, abdominal pain, diarrhea and constipation.  Genitourinary: Negative for dysuria, urgency, frequency and flank pain.  Musculoskeletal: Negative for falls. Has chronic back back and roxicodone has it controlled Skin: Negative for itching and rash.  Neurological: Negative for dizziness, tingling, focal weakness and headaches.  Psychiatric/Behavioral: Negative for depression and memory loss. The patient is not nervous/anxious.    Past Medical History  Diagnosis Date  . GERD (gastroesophageal reflux disease)   . Shortness of  breath   . Arthritis   . Wears dentures   . Atrial fibrillation     HX OF SICK SINUS SYNDROME-ATRIAL FIB  . 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 Peripheral vascular disease     CHRONIC VENOUS INSUFFICIENCY  . Back pain, chronic     PT STATES 5 BULGING DISKS AND PINCHED NERVES--PT ON OXYCODONE 4 TIMES A DAY FOR HIS PAIN   Current Outpatient Prescriptions on File Prior to Visit  Medication Sig Dispense Refill  . metoprolol succinate (TOPROL-XL) 25 MG 24 hr tablet TAKE 1 TABLET BY MOUTH EVERY DAY  90 tablet  0  . nystatin-triamcinolone ointment (MYCOLOG) APPLY TO AFFECTED AREA TWICE A DAY  30 g  3  . pantoprazole (PROTONIX) 20 MG tablet Take 1 tablet (20 mg total) by mouth daily.  30 tablet  6  . PRADAXA 150 MG CAPS capsule TAKE ONE CAPSULE BY MOUTH EVERY 12 HOURS  60 capsule  3  . PROAIR HFA 108 (90 BASE) MCG/ACT inhaler INHALE 1 PUFF BY MOUTH EVERY 4 HOURS  8.5 each  3  . tiotropium (SPIRIVA HANDIHALER) 18 MCG inhalation capsule Place 1 capsule (18 mcg total) into inhaler and inhale daily.  30 capsule  12  . traZODone (DESYREL) 150 MG tablet Take 1 tablet (150 mg total) by mouth at bedtime. Take one tablet daily at bedtime  30 tablet  3   No current facility-administered medications on file prior to visit.   Physical exam BP 148/84  Pulse  64  Temp(Src) 98.6 F (37 C) (Oral)  Ht 6\' 5"  (1.956 m)  Wt 354 lb (160.573 kg)  BMI 41.97 kg/m2  SpO2 98%  General- adult male in no acute distress, obese Head- atraumatic, normocephalic Cardiovascular- irregular heart rate, no murmurs Respiratory- bilateral poor air entry, no rhonchi, no crackles, no use of accessory muscles Abdomen- bowel sounds present, soft, non tender Musculoskeletal- able to move all 4 extremities, normal range of motion, no leg edema Neurological- no focal deficit Skin- warm and dry Psychiatry- alert and oriented to person, place and time,  normal mood and affect   Lab Results  Component Value Date   CREATININE 0.71* 11/17/2013   Lab Results  Component Value Date   HGBA1C 5.5 05/09/2013   CMP     Component Value Date/Time   NA 142 11/17/2013 0835   NA 137 10/13/2011 0930   K 4.2 11/17/2013 0835   CL 99 11/17/2013 0835   CO2 26 11/17/2013 0835   GLUCOSE 79 11/17/2013 0835   GLUCOSE 74 10/13/2011 0930   BUN 18 11/17/2013 0835   BUN 15 10/13/2011 0930   CREATININE 0.71* 11/17/2013 0835   CREATININE 0.75 06/20/2011 1032   CALCIUM 9.1 11/17/2013 0835   PROT 6.6 11/17/2013 0835   PROT 7.4 10/13/2011 0930   ALBUMIN 3.7 10/13/2011 0930   AST 20 11/17/2013 0835   ALT 11 11/17/2013 0835   ALKPHOS 100 11/17/2013 0835   BILITOT 0.4 11/17/2013 0835   GFRNONAA 105 11/17/2013 0835   GFRAA 121 11/17/2013 0835   Assessment/plan  1. Benign essential HTN Elevated bp reading, increase lisinopril to 10 mg daily. Continue toprol. Monitor bp at home, goal < 130/80 - CMP; Future - Lipid Panel; Future - CBC with Differential; Future  2. Chronic atrial fibrillation Continue toprol, rate controlled, continue pradaxa  3. Chronic airway obstruction, not elsewhere classified Continue proair and spiriva. Flu vaccine today  4. Gastroesophageal reflux disease, esophagitis presence not specified Stable with protonix 20 mg daily - CBC with Differential; Future  5. Back pain, chronic Continue roxicodone, 30 days refill given

## 2014-03-01 ENCOUNTER — Encounter: Payer: Self-pay | Admitting: Internal Medicine

## 2014-03-03 ENCOUNTER — Other Ambulatory Visit: Payer: Self-pay | Admitting: Internal Medicine

## 2014-03-13 ENCOUNTER — Other Ambulatory Visit: Payer: Self-pay | Admitting: *Deleted

## 2014-03-13 MED ORDER — DABIGATRAN ETEXILATE MESYLATE 150 MG PO CAPS: ORAL_CAPSULE | ORAL | Status: DC

## 2014-03-13 NOTE — Telephone Encounter (Signed)
[facility] 

## 2014-03-19 ENCOUNTER — Other Ambulatory Visit: Payer: Self-pay | Admitting: *Deleted

## 2014-03-19 MED ORDER — LISINOPRIL 10 MG PO TABS: 10.0000 mg | ORAL_TABLET | Freq: Every day | ORAL | Status: DC

## 2014-03-19 MED ORDER — TRAZODONE HCL 150 MG PO TABS: ORAL_TABLET | ORAL | Status: DC

## 2014-03-19 NOTE — Telephone Encounter (Signed)
[facility] 

## 2014-03-22 ENCOUNTER — Other Ambulatory Visit: Payer: Self-pay | Admitting: *Deleted

## 2014-03-22 MED ORDER — OXYCODONE HCL 30 MG PO TABS: ORAL_TABLET | ORAL | Status: DC

## 2014-03-22 NOTE — Telephone Encounter (Signed)
Patient Requested and will pick up 

## 2014-03-30 ENCOUNTER — Encounter: Payer: Self-pay | Admitting: Internal Medicine

## 2014-04-19 ENCOUNTER — Other Ambulatory Visit: Payer: Self-pay | Admitting: *Deleted

## 2014-04-19 MED ORDER — OXYCODONE HCL 30 MG PO TABS: ORAL_TABLET | ORAL | Status: DC

## 2014-04-19 NOTE — Telephone Encounter (Signed)
Patient Requested and will pick up 

## 2014-05-16 ENCOUNTER — Other Ambulatory Visit: Payer: Self-pay | Admitting: *Deleted

## 2014-05-16 MED ORDER — OXYCODONE HCL 30 MG PO TABS: ORAL_TABLET | ORAL | Status: DC

## 2014-06-12 ENCOUNTER — Other Ambulatory Visit: Payer: Self-pay | Admitting: *Deleted

## 2014-06-12 MED ORDER — OXYCODONE HCL 30 MG PO TABS
ORAL_TABLET | ORAL | Status: DC
Start: 2014-06-12 — End: 2014-07-09

## 2014-06-12 NOTE — Telephone Encounter (Signed)
Patient requested and will pick up 

## 2014-06-21 ENCOUNTER — Other Ambulatory Visit: Payer: Medicare Other

## 2014-06-22 ENCOUNTER — Other Ambulatory Visit: Payer: Medicare Other

## 2014-06-25 ENCOUNTER — Other Ambulatory Visit: Payer: Self-pay | Admitting: Internal Medicine

## 2014-06-25 ENCOUNTER — Other Ambulatory Visit: Payer: Medicare Other

## 2014-06-25 DIAGNOSIS — E785 Hyperlipidemia, unspecified: Secondary | ICD-10-CM | POA: Diagnosis not present

## 2014-06-25 DIAGNOSIS — K219 Gastro-esophageal reflux disease without esophagitis: Secondary | ICD-10-CM

## 2014-06-25 DIAGNOSIS — I10 Essential (primary) hypertension: Secondary | ICD-10-CM | POA: Diagnosis not present

## 2014-06-25 DIAGNOSIS — I482 Chronic atrial fibrillation: Secondary | ICD-10-CM | POA: Diagnosis not present

## 2014-06-26 ENCOUNTER — Encounter: Payer: Self-pay | Admitting: Internal Medicine

## 2014-06-26 ENCOUNTER — Ambulatory Visit (INDEPENDENT_AMBULATORY_CARE_PROVIDER_SITE_OTHER): Payer: Medicare Other | Admitting: Internal Medicine

## 2014-06-26 DIAGNOSIS — J438 Other emphysema: Secondary | ICD-10-CM | POA: Diagnosis not present

## 2014-06-26 DIAGNOSIS — E785 Hyperlipidemia, unspecified: Secondary | ICD-10-CM

## 2014-06-26 DIAGNOSIS — K219 Gastro-esophageal reflux disease without esophagitis: Secondary | ICD-10-CM

## 2014-06-26 DIAGNOSIS — M549 Dorsalgia, unspecified: Secondary | ICD-10-CM | POA: Diagnosis not present

## 2014-06-26 DIAGNOSIS — G4733 Obstructive sleep apnea (adult) (pediatric): Secondary | ICD-10-CM

## 2014-06-26 DIAGNOSIS — I482 Chronic atrial fibrillation, unspecified: Secondary | ICD-10-CM

## 2014-06-26 DIAGNOSIS — G4700 Insomnia, unspecified: Secondary | ICD-10-CM | POA: Diagnosis not present

## 2014-06-26 DIAGNOSIS — I10 Essential (primary) hypertension: Secondary | ICD-10-CM

## 2014-06-26 DIAGNOSIS — Z9989 Dependence on other enabling machines and devices: Secondary | ICD-10-CM

## 2014-06-26 DIAGNOSIS — G8929 Other chronic pain: Secondary | ICD-10-CM

## 2014-06-26 LAB — COMPREHENSIVE METABOLIC PANEL
ALT: 9 IU/L (ref 0–44)
AST: 18 IU/L (ref 0–40)
Albumin/Globulin Ratio: 2.1 (ref 1.1–2.5)
Albumin: 4.1 g/dL (ref 3.5–5.5)
Alkaline Phosphatase: 73 IU/L (ref 39–117)
BUN/Creatinine Ratio: 13 (ref 9–20)
BUN: 12 mg/dL (ref 6–24)
CHLORIDE: 99 mmol/L (ref 97–108)
CO2: 28 mmol/L (ref 18–29)
Calcium: 9.1 mg/dL (ref 8.7–10.2)
Creatinine, Ser: 0.91 mg/dL (ref 0.76–1.27)
GFR calc Af Amer: 109 mL/min/{1.73_m2} (ref >59)
GFR calc non Af Amer: 94 mL/min/{1.73_m2} (ref >59)
GLUCOSE: 88 mg/dL (ref 65–99)
Globulin, Total: 2 g/dL (ref 1.5–4.5)
POTASSIUM: 4 mmol/L (ref 3.5–5.2)
Sodium: 140 mmol/L (ref 134–144)
TOTAL PROTEIN: 6.1 g/dL (ref 6.0–8.5)
Total Bilirubin: 0.5 mg/dL (ref 0.0–1.2)

## 2014-06-26 LAB — CBC WITH DIFFERENTIAL/PLATELET
BASOS ABS: 0 10*3/uL (ref 0.0–0.2)
Basos: 0 %
Eos: 3 %
Eosinophils Absolute: 0.2 10*3/uL (ref 0.0–0.4)
HEMATOCRIT: 41.4 % (ref 37.5–51.0)
HEMOGLOBIN: 13.9 g/dL (ref 12.6–17.7)
IMMATURE GRANULOCYTES: 0 %
Immature Grans (Abs): 0 10*3/uL (ref 0.0–0.1)
Lymphocytes Absolute: 1.1 10*3/uL (ref 0.7–3.1)
Lymphs: 24 %
MCH: 28.6 pg (ref 26.6–33.0)
MCHC: 33.6 g/dL (ref 31.5–35.7)
MCV: 85 fL (ref 79–97)
MONOCYTES: 10 %
MONOS ABS: 0.5 10*3/uL (ref 0.1–0.9)
NEUTROS ABS: 2.8 10*3/uL (ref 1.4–7.0)
Neutrophils Relative %: 63 %
RBC: 4.86 x10E6/uL (ref 4.14–5.80)
RDW: 15.8 % — AB (ref 12.3–15.4)
WBC: 4.5 10*3/uL (ref 3.4–10.8)

## 2014-06-26 LAB — LIPID PANEL
Chol/HDL Ratio: 4.2 ratio units (ref 0.0–5.0)
Cholesterol, Total: 125 mg/dL (ref 100–199)
HDL: 30 mg/dL — ABNORMAL LOW (ref >39)
LDL Calculated: 80 mg/dL (ref 0–99)
Triglycerides: 74 mg/dL (ref 0–149)
VLDL CHOLESTEROL CAL: 15 mg/dL (ref 5–40)

## 2014-06-26 NOTE — Progress Notes (Signed)
Patient ID: Kyle [name], male   DOB: 08/21/1955, 59 y.o.   MRN: [id number]    Chief Complaint  Patient presents with  . Medical Management of Chronic Issues   Allergies  Allergen Reactions  . Vioxx [Rofecoxib]     "BAD RASH-LIKE RED RASH"   HPI 59 y/o male patient is here for routine visit. He mentions not having smoked since 06/15/14. He has been using his CPAP machine though not every night. Reviewed home bp readings- 113-140/ 64-89 with highest reading 141/91 Has family history of heart disease in both his parents Has lost few pounds but has morbid obesity He has history of HTN, COPD, chronic back pain, afib, insomnia    Review of Systems  Constitutional: Negative for fever, chills, malaise/fatigue and diaphoresis.  HENT: Negative for congestion, sore throat.   Eyes: Negative for blurred vision, double vision and discharge. Last eye exam about a year back Respiratory: Negative for shortness of breath and wheezing.  uses cpap. Has chronic cough Cardiovascular: Negative for chest pain, palpitations. Has ocassional leg swelling.  Gastrointestinal: Negative for heartburn, nausea, vomiting, abdominal pain, diarrhea and constipation.  Genitourinary: Negative for dysuria, urgency, frequency, hematuria and flank pain.  Musculoskeletal: Negative for falls. Has chronic back back and roxicodone has it controlled Skin: Negative for itching and rash.  Neurological: Negative for dizziness, tingling, focal weakness and headaches.  Psychiatric/Behavioral: Negative for depression and memory loss. The patient is not nervous/anxious. has been sleeping better with trazodone- able to get 6 hours of sleep a day.   Past Medical History  Diagnosis Date  . GERD (gastroesophageal reflux disease)   . Shortness of breath   . Arthritis   . Wears dentures   . Atrial fibrillation     HX OF SICK SINUS SYNDROME-ATRIAL FIB  . Sleep apnea     UNABLE TO TOLERATE CPAP MASK BECAUSE OF CLAUSTROPHOBIA AND DOES  NOT HAVE MASK OR MACHINE AT HOME  . Peripheral vascular disease     CHRONIC VENOUS INSUFFICIENCY  . Back pain, chronic     PT STATES 5 BULGING DISKS AND PINCHED NERVES--PT ON OXYCODONE 4 TIMES A DAY FOR HIS PAIN   Current Outpatient Prescriptions on File Prior to Visit  Medication Sig Dispense Refill  . dabigatran (PRADAXA) 150 MG CAPS capsule Take one capsule by mouth every 12 hours 60 capsule 3  . lisinopril (PRINIVIL,ZESTRIL) 10 MG tablet Take 1 tablet (10 mg total) by mouth daily. 90 tablet 3  . metoprolol succinate (TOPROL-XL) 25 MG 24 hr tablet TAKE 1 TABLET BY MOUTH EVERY DAY 90 tablet 0  . nystatin-triamcinolone ointment (MYCOLOG) APPLY TO AFFECTED AREA TWICE A DAY 30 g 3  . oxycodone (ROXICODONE) 30 MG immediate release tablet Take one tablet by mouth every 4 hours as needed for pain 180 tablet 0  . pantoprazole (PROTONIX) 20 MG tablet Take 1 tablet (20 mg total) by mouth daily. 30 tablet 6  . PROAIR HFA 108 (90 BASE) MCG/ACT inhaler INHALE 1 PUFF BY MOUTH EVERY 4 HOURS 8.5 each 3  . tiotropium (SPIRIVA HANDIHALER) 18 MCG inhalation capsule Place 1 capsule (18 mcg total) into inhaler and inhale daily. 30 capsule 12  . traZODone (DESYREL) 150 MG tablet Take one tablet by mouth at bedtime for rest 30 tablet 5   No current facility-administered medications on file prior to visit.    Physical exam BP 130/82 mmHg  Pulse 68  Temp(Src) 98.4 F (36.9 C) (Oral)  Resp 20  Ht 6'  5" (1.956 m)  Wt 350 lb 12.8 oz (159.122 kg)  BMI 41.59 kg/m2  SpO2 98%  Wt Readings from Last 3 Encounters:  06/26/14 350 lb 12.8 oz (159.122 kg)  02/21/14 354 lb (160.573 kg)  11/24/13 354 lb (160.573 kg)   General- adult male in no acute distress, obese Head- atraumatic, normocephalic Cardiovascular- irregular heart rate, no murmurs Respiratory- bilateral poor air entry, no rhonchi, no crackles, no use of accessory muscles Abdomen- bowel sounds present, soft, non tender Musculoskeletal- able to move  all 4 extremities, normal range of motion, no leg edema Neurological- no focal deficit Skin- warm and dry Psychiatry- alert and oriented to person, place and time, normal mood and affect   Labs CBC Latest Ref Rng 06/25/2014 11/17/2013 05/09/2013  WBC 3.4 - 10.8 x10E3/uL 4.5 5.9 5.8  Hemoglobin 12.6 - 17.7 g/dL 13.9 13.3 14.1  Hematocrit 37.5 - 51.0 % 41.4 41.8 42.7  Platelets 150 - 400 K/uL - - -   CMP Latest Ref Rng 06/25/2014 11/17/2013 05/09/2013  Glucose 65 - 99 mg/dL 88 79 91  BUN 6 - 24 mg/dL 12 18 11   Creatinine 0.76 - 1.27 mg/dL 0.91 0.71(L) 0.88  Sodium 134 - 144 mmol/L 140 142 142  Potassium 3.5 - 5.2 mmol/L 4.0 4.2 4.5  Chloride 97 - 108 mmol/L 99 99 99  CO2 18 - 29 mmol/L 28 26 26   Calcium 8.7 - 10.2 mg/dL 9.1 9.1 9.4  Total Protein 6.0 - 8.5 g/dL 6.1 6.6 -  Albumin 3.5 - 5.5 g/dL 4.1 3.9 -  Total Bilirubin 0.0 - 1.2 mg/dL 0.5 0.4 -  Alkaline Phos 39 - 117 IU/L 73 100 -  AST 0 - 40 IU/L 18 20 -  ALT 0 - 44 IU/L 9 11 -   Lab Results  Component Value Date   HGBA1C 5.5 05/09/2013   Lab Results  Component Value Date   TSH 2.742 06/20/2011   Lipid Panel     Component Value Date/Time   CHOL 159 06/20/2011 1032   TRIG 74 06/25/2014 0818   HDL 30* 06/25/2014 0818   HDL 30* 06/20/2011 1032   CHOLHDL 4.2 06/25/2014 0818   CHOLHDL 5.3 06/20/2011 1032   VLDL 14 06/20/2011 1032   LDLCALC 80 06/25/2014 0818   LDLCALC 115* 06/20/2011 1032   Assessment/plan  1. Morbid obesity Has lost few pounds since last visit, encouraged to loose more with his risk with co-morbidities.  - CMP; Future - Lipid Panel; Future  2. Chronic atrial fibrillation Rate controlled. Continue pradaxa and toprol xl  3. Essential hypertension Stable bp readings, continue lisinopril 10 mg daily and toprol xl. Will add aspirin 81 mg daily with his cardiac risk factors  4. OSA on CPAP Encouraged to use cpap daily, weight reduction encouraged  5. Other emphysema Mentions having quit smoking  for now, congratulated him, continue spiriva and proair for now  6. Gastroesophageal reflux disease, esophagitis presence not specified Controlled, continue protonix  7. Back pain, chronic His body habitus contributes to this, continue roxicodone current regimen  8. Insomnia Trazodone has been helpful, continue this  9. Hyperlipidemia LDL goal <100 ldl goal < 100, check lipid panel and consider statin if level remains > 100. Pt prefers to try diet and exercise first and would like to hold off on meds for now - Lipid Panel; Future

## 2014-06-27 LAB — SPECIMEN STATUS REPORT

## 2014-06-27 LAB — HGB A1C W/O EAG: Hgb A1c MFr Bld: 5.5 % (ref 4.8–5.6)

## 2014-06-27 LAB — TSH: TSH: 2.17 u[IU]/mL (ref 0.450–4.500)

## 2014-07-09 ENCOUNTER — Other Ambulatory Visit: Payer: Self-pay | Admitting: *Deleted

## 2014-07-09 MED ORDER — OXYCODONE HCL 30 MG PO TABS: ORAL_TABLET | ORAL | Status: DC

## 2014-07-09 NOTE — Telephone Encounter (Signed)
Patient Requested and will pick up. Discussed with Dr. At [facility] he would be picking up a couple days early.

## 2014-07-11 ENCOUNTER — Other Ambulatory Visit: Payer: Self-pay | Admitting: Internal Medicine

## 2014-08-07 ENCOUNTER — Other Ambulatory Visit: Payer: Self-pay | Admitting: *Deleted

## 2014-08-07 ENCOUNTER — Encounter: Payer: Self-pay | Admitting: Internal Medicine

## 2014-08-07 MED ORDER — OXYCODONE HCL 30 MG PO TABS: ORAL_TABLET | ORAL | Status: DC

## 2014-08-07 NOTE — Telephone Encounter (Signed)
Patient Requested and will pick up 

## 2014-08-31 ENCOUNTER — Other Ambulatory Visit: Payer: Self-pay | Admitting: *Deleted

## 2014-08-31 MED ORDER — NYSTATIN-TRIAMCINOLONE 100000-0.1 UNIT/GM-% EX OINT: TOPICAL_OINTMENT | CUTANEOUS | Status: DC

## 2014-08-31 NOTE — Telephone Encounter (Signed)
Patient requested to be faxed to pharmacy. Wanted to pick up pain medication on Monday but would be too early. Told patient Wednesday.

## 2014-09-05 ENCOUNTER — Other Ambulatory Visit: Payer: Self-pay | Admitting: *Deleted

## 2014-09-05 MED ORDER — OXYCODONE HCL 30 MG PO TABS: ORAL_TABLET | ORAL | Status: DC

## 2014-09-26 ENCOUNTER — Other Ambulatory Visit: Payer: Self-pay | Admitting: Nurse Practitioner

## 2014-10-01 ENCOUNTER — Telehealth: Payer: Self-pay | Admitting: *Deleted

## 2014-10-01 MED ORDER — OXYCODONE HCL 30 MG PO TABS: ORAL_TABLET | ORAL | Status: DC

## 2014-10-01 NOTE — Telephone Encounter (Signed)
Ok to print pain rx early

## 2014-10-01 NOTE — Telephone Encounter (Signed)
Printed and patient Notified and will pick up

## 2014-10-01 NOTE — Telephone Encounter (Signed)
Patient called and stated that he needed his pain medication tomorrow because he is going on a cruise and it is not due until Friday. Patient is leaving and needs to get it pack. Please Advise if it ok to go ahead and print it early. Former patient of Dr. Bubba Camp.

## 2014-10-10 ENCOUNTER — Other Ambulatory Visit: Payer: Self-pay | Admitting: Internal Medicine

## 2014-10-31 ENCOUNTER — Other Ambulatory Visit: Payer: Self-pay | Admitting: *Deleted

## 2014-10-31 MED ORDER — OXYCODONE HCL 30 MG PO TABS: ORAL_TABLET | ORAL | Status: DC

## 2014-10-31 NOTE — Telephone Encounter (Signed)
Called patient to inform him that his prescription will be ready after 12:00 noon.

## 2014-11-27 ENCOUNTER — Telehealth: Payer: Self-pay | Admitting: *Deleted

## 2014-11-27 MED ORDER — OXYCODONE HCL 30 MG PO TABS: ORAL_TABLET | ORAL | Status: DC

## 2014-11-27 NOTE — Telephone Encounter (Signed)
Mr. Nazareno called requesting his prescription early due to a trip to [place]. He had a nephew to get killed in a horrific incident. I informed him that I would have to get a okay from the doctor on today. I called the patient to tell him that he could pick up his script.

## 2014-12-19 ENCOUNTER — Other Ambulatory Visit: Payer: Medicare Other

## 2014-12-19 DIAGNOSIS — E785 Hyperlipidemia, unspecified: Secondary | ICD-10-CM | POA: Diagnosis not present

## 2014-12-20 LAB — COMPREHENSIVE METABOLIC PANEL
A/G RATIO: 1.6 (ref 1.1–2.5)
ALT: 14 IU/L (ref 0–44)
AST: 25 IU/L (ref 0–40)
Albumin: 4.2 g/dL (ref 3.5–5.5)
Alkaline Phosphatase: 74 IU/L (ref 39–117)
BUN/Creatinine Ratio: 19 (ref 9–20)
BUN: 16 mg/dL (ref 6–24)
Bilirubin Total: 0.8 mg/dL (ref 0.0–1.2)
CALCIUM: 9.7 mg/dL (ref 8.7–10.2)
CO2: 25 mmol/L (ref 18–29)
CREATININE: 0.85 mg/dL (ref 0.76–1.27)
Chloride: 98 mmol/L (ref 97–108)
GFR calc Af Amer: 112 mL/min/{1.73_m2} (ref >59)
GFR, EST NON AFRICAN AMERICAN: 97 mL/min/{1.73_m2} (ref >59)
GLUCOSE: 103 mg/dL — AB (ref 65–99)
Globulin, Total: 2.7 g/dL (ref 1.5–4.5)
POTASSIUM: 4 mmol/L (ref 3.5–5.2)
Sodium: 139 mmol/L (ref 134–144)
TOTAL PROTEIN: 6.9 g/dL (ref 6.0–8.5)

## 2014-12-20 LAB — LIPID PANEL
CHOLESTEROL TOTAL: 142 mg/dL (ref 100–199)
Chol/HDL Ratio: 4.9 ratio units (ref 0.0–5.0)
HDL: 29 mg/dL — ABNORMAL LOW (ref >39)
LDL Calculated: 93 mg/dL (ref 0–99)
Triglycerides: 100 mg/dL (ref 0–149)
VLDL CHOLESTEROL CAL: 20 mg/dL (ref 5–40)

## 2014-12-21 ENCOUNTER — Telehealth: Payer: Self-pay | Admitting: *Deleted

## 2014-12-21 MED ORDER — OXYCODONE HCL 30 MG PO TABS: ORAL_TABLET | ORAL | Status: DC

## 2014-12-21 NOTE — Telephone Encounter (Signed)
Patient called stating that he had an accident with his medication in the bathroom this morning, medication fell in sink of hot water while shaving and he lost it all. Ask if it would be possible for him to get a new script a little early.I did ask Dr. Eulas Post and she agreed to give him a prescription  this time.

## 2014-12-25 ENCOUNTER — Encounter: Payer: Medicare Other | Admitting: Internal Medicine

## 2014-12-26 ENCOUNTER — Encounter: Payer: Medicare Other | Admitting: Internal Medicine

## 2014-12-29 ENCOUNTER — Other Ambulatory Visit: Payer: Self-pay | Admitting: Internal Medicine

## 2015-01-06 ENCOUNTER — Other Ambulatory Visit: Payer: Self-pay | Admitting: Internal Medicine

## 2015-01-09 ENCOUNTER — Other Ambulatory Visit: Payer: Self-pay

## 2015-01-09 MED ORDER — NYSTATIN-TRIAMCINOLONE 100000-0.1 UNIT/GM-% EX OINT: TOPICAL_OINTMENT | CUTANEOUS | Status: DC

## 2015-01-18 ENCOUNTER — Other Ambulatory Visit: Payer: Self-pay | Admitting: *Deleted

## 2015-01-18 MED ORDER — OXYCODONE HCL 30 MG PO TABS: ORAL_TABLET | ORAL | Status: DC

## 2015-01-18 NOTE — Telephone Encounter (Signed)
Patient requested and will pick up 

## 2015-01-26 ENCOUNTER — Other Ambulatory Visit: Payer: Self-pay | Admitting: Internal Medicine

## 2015-02-06 ENCOUNTER — Encounter: Payer: Self-pay | Admitting: Internal Medicine

## 2015-02-06 ENCOUNTER — Ambulatory Visit (INDEPENDENT_AMBULATORY_CARE_PROVIDER_SITE_OTHER): Payer: Medicare Other | Admitting: Internal Medicine

## 2015-02-06 VITALS — BP 128/72 | HR 67 | Temp 97.7°F | Resp 20 | Ht 77.0 in | Wt 329.2 lb

## 2015-02-06 DIAGNOSIS — Z0000 Encounter for general adult medical examination without abnormal findings: Secondary | ICD-10-CM

## 2015-02-06 DIAGNOSIS — E785 Hyperlipidemia, unspecified: Secondary | ICD-10-CM | POA: Diagnosis not present

## 2015-02-06 DIAGNOSIS — G4733 Obstructive sleep apnea (adult) (pediatric): Secondary | ICD-10-CM

## 2015-02-06 DIAGNOSIS — K219 Gastro-esophageal reflux disease without esophagitis: Secondary | ICD-10-CM

## 2015-02-06 DIAGNOSIS — J438 Other emphysema: Secondary | ICD-10-CM | POA: Diagnosis not present

## 2015-02-06 DIAGNOSIS — I482 Chronic atrial fibrillation, unspecified: Secondary | ICD-10-CM

## 2015-02-06 DIAGNOSIS — Z9989 Dependence on other enabling machines and devices: Secondary | ICD-10-CM

## 2015-02-06 DIAGNOSIS — M549 Dorsalgia, unspecified: Secondary | ICD-10-CM | POA: Diagnosis not present

## 2015-02-06 DIAGNOSIS — G8929 Other chronic pain: Secondary | ICD-10-CM | POA: Diagnosis not present

## 2015-02-06 DIAGNOSIS — I10 Essential (primary) hypertension: Secondary | ICD-10-CM | POA: Diagnosis not present

## 2015-02-06 NOTE — Progress Notes (Signed)
Patient ID: Kyle [name], male   DOB: [birth year], 57 y.o.   MRN: [id number] Subjective:     Kyle [name] is a 59 y.o. male and is here for a comprehensive physical exam. The patient reports no problems. He is losing weight by exercising and consuming healthy foods. He is down 21 lbs since Jan 2016.  He needs med RF  Past Medical History  Diagnosis Date  . GERD (gastroesophageal reflux disease)   . Shortness of breath   . Arthritis   . Wears dentures   . Atrial fibrillation     HX OF SICK SINUS SYNDROME-ATRIAL FIB  . 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 Peripheral vascular disease     CHRONIC VENOUS INSUFFICIENCY  . Back pain, chronic     PT STATES 5 BULGING DISKS AND PINCHED NERVES--PT ON OXYCODONE 4 TIMES A DAY FOR HIS PAIN   Past Surgical History  Procedure Laterality Date  . Gastric restriction surgery  1992  . Leg surgery  1998    calcium deposit removed on right  leg   . Foot surgery  2003    right foot surgery from work accident   . Esophagogastroduodenoscopy  05/18/2011    Procedure: ESOPHAGOGASTRODUODENOSCOPY (EGD);  Surgeon: Shann Medal, MD;  Location: [facility];  Service: Endoscopy;  Laterality: N/A;  . Gastric bypass  10/19/11   Family History  Problem Relation Age of Onset  . Cancer Mother     melanoma  . Heart disease Mother   . Lung cancer Father     melanoma  . Heart disease Father   . Heart disease Sister   . Rheum arthritis Mother     Social History   Social History  . Marital Status: Single    Spouse Name: N/A  . Number of Children: N/A  . Years of Education: N/A   Occupational History  . Not on file.   Social History Main Topics  . Smoking status: Former Smoker    Quit date: 03/06/2011  . Smokeless tobacco: Never Used  . Alcohol Use: Yes     Comment: RARELY  . Drug Use: No  . Sexual Activity: Not on file   Other Topics Concern  . Not on file    Social History Narrative   Last annual exam from 73 (old electronic system) was 04/04/2012    Health Maintenance  Topic Date Due  . Hepatitis C Screening  11-28-1955  . HIV Screening  10/13/1972  . COLONOSCOPY  10/14/2007  . INFLUENZA VACCINE  01/14/2015  . TETANUS/TDAP  06/16/2019    Review of Systems   Review of Systems  Constitutional: Negative for fever, chills and malaise/fatigue.  HENT: Negative for sore throat and tinnitus.   Eyes: Negative for blurred vision and double vision.  Respiratory: Negative for cough, shortness of breath and wheezing.   Cardiovascular: Negative for chest pain, palpitations, orthopnea and leg swelling.  Gastrointestinal: Negative for heartburn, nausea, vomiting, abdominal pain, diarrhea, constipation and blood in stool.  Genitourinary: Negative for dysuria, urgency, frequency and hematuria.  Musculoskeletal: Positive for myalgias, back pain and joint pain. Negative for falls.  Skin: Negative for rash.  Neurological: Negative for dizziness, tingling, tremors, sensory change, focal weakness, seizures, loss of consciousness, weakness and headaches.  Endo/Heme/Allergies: Negative for environmental allergies. Does not bruise/bleed easily.  Psychiatric/Behavioral: Negative for depression and memory loss. The patient is  not nervous/anxious and does not have insomnia.      Objective:      Physical Exam  Constitutional: He is oriented to person, place, and time and well-developed, well-nourished, and in no distress. No distress.  HENT:  Head: Normocephalic and atraumatic.  Right Ear: Hearing, tympanic membrane, external ear and ear canal normal.  Left Ear: Hearing, tympanic membrane, external ear and ear canal normal.  Mouth/Throat: Uvula is midline, oropharynx is clear and moist and mucous membranes are normal.  Eyes: Conjunctivae, EOM and lids are normal. Right eye exhibits no discharge. No scleral icterus.  Neck: Trachea normal. Neck supple.  Carotid bruit is not present. No tracheal deviation present. No thyroid mass and no thyromegaly present.  Cardiovascular: Normal rate, regular rhythm, normal heart sounds and intact distal pulses.  Exam reveals no gallop and no friction rub.   No murmur heard. Pulmonary/Chest: Effort normal and breath sounds normal. No stridor. No respiratory distress. He has no wheezes. He has no rhonchi. He has no rales. He exhibits no mass, no tenderness and no crepitus. Right breast exhibits no inverted nipple, no mass, no nipple discharge, no skin change and no tenderness. Left breast exhibits no inverted nipple, no mass, no nipple discharge, no skin change and no tenderness. Breasts are symmetrical.  Abdominal: Soft. Normal appearance, normal aorta and bowel sounds are normal. He exhibits no abdominal bruit, no ascites, no pulsatile midline mass and no mass. There is no hepatosplenomegaly. There is no tenderness. There is no rebound. No hernia.  obese  Genitourinary: Testes/scrotum normal.  Pt declined  Musculoskeletal: He exhibits edema and tenderness.       Back:  Lymphadenopathy:       Head (right side): No submandibular and no posterior auricular adenopathy present.       Head (left side): No submandibular and no posterior auricular adenopathy present.    He has no cervical adenopathy.       Right: No supraclavicular adenopathy present.       Left: No supraclavicular adenopathy present.  Neurological: He is alert and oriented to person, place, and time. He has normal strength. Gait abnormal.  Skin: Skin is warm, dry and intact. No rash noted.  Multiple tattoos on b/l UE  Psychiatric: Mood, memory, affect and judgment normal.     Recent Results (from the past 2160 hour(s))  CMP     Status: Abnormal   Collection Time: 12/19/14  8:04 AM  Result Value Ref Range   Glucose 103 (H) 65 - 99 mg/dL   BUN 16 6 - 24 mg/dL   Creatinine, Ser 0.85 0.76 - 1.27 mg/dL   GFR calc non Af Amer 97 >59 mL/min/1.73     GFR calc Af Amer 112 >59 mL/min/1.73   BUN/Creatinine Ratio 19 9 - 20   Sodium 139 134 - 144 mmol/L   Potassium 4.0 3.5 - 5.2 mmol/L   Chloride 98 97 - 108 mmol/L   CO2 25 18 - 29 mmol/L   Calcium 9.7 8.7 - 10.2 mg/dL   Total Protein 6.9 6.0 - 8.5 g/dL   Albumin 4.2 3.5 - 5.5 g/dL   Globulin, Total 2.7 1.5 - 4.5 g/dL   Albumin/Globulin Ratio 1.6 1.1 - 2.5   Bilirubin Total 0.8 0.0 - 1.2 mg/dL   Alkaline Phosphatase 74 39 - 117 IU/L   AST 25 0 - 40 IU/L   ALT 14 0 - 44 IU/L  Lipid Panel     Status: Abnormal   Collection  Time: 12/19/14  8:04 AM  Result Value Ref Range   Cholesterol, Total 142 100 - 199 mg/dL   Triglycerides 100 0 - 149 mg/dL   HDL 29 (L) >39 mg/dL    Comment: According to ATP-III Guidelines, HDL-C >59 mg/dL is considered a negative risk factor for CHD.    VLDL Cholesterol Cal 20 5 - 40 mg/dL   LDL Calculated 93 0 - 99 mg/dL   Chol/HDL Ratio 4.9 0.0 - 5.0 ratio units    Comment:                                   T. Chol/HDL Ratio                                             Men  Women                               1/2 Avg.Risk  3.4    3.3                                   Avg.Risk  5.0    4.4                                2X Avg.Risk  9.6    7.1                                3X Avg.Risk 23.4   11.0    ECG OBTAINED and reviewed by myself: Afib @ 48 bpm, nml axis, poor R wave progression. No acute ischemic changes. No changes since 11/2013.   Assessment:    Healthy male exam.       ICD-9-CM ICD-10-CM   1. Well adult exam V70.0 Z00.00   2. Morbid obesity 278.01 E66.01   3. Chronic atrial fibrillation 427.31 I48.2   4. Essential hypertension 401.9 I10   5. Other emphysema 492.8 J43.8   6. Back pain, chronic 724.5 M54.9    338.29 G89.29   7. OSA on CPAP 327.23 G47.33   8. Gastroesophageal reflux disease, esophagitis presence not specified 530.81 K21.9   9. Hyperlipidemia LDL goal <100 272.4 E78.5     Plan:     See After Visit Summary for Counseling  Recommendations   Pt is UTD on health maintenance. Vaccinations are UTD. Pt maintains a healthy lifestyle. Encouraged pt to exercise 30-45 minutes 4-5 times per week. Eat a well balanced diet. Avoid smoking. Limit alcohol intake. Wear seatbelt when riding in the car. Wear sun block (SPF >50) when spending extended times outside.  Med RF given - he will need to RTO to pick up pain rx next week  Cont current meds as ordered  Follow up in 4 mos for routine visit  Ottilie Wigglesworth S. Perlie Gold  [facility] [address] [place], [place] [address] [phone] Cell (Monday-Friday 8 AM - 5 PM) [phone] After 5 PM and follow prompts

## 2015-02-07 MED ORDER — NYSTATIN-TRIAMCINOLONE 100000-0.1 UNIT/GM-% EX OINT: TOPICAL_OINTMENT | CUTANEOUS | Status: DC

## 2015-02-07 MED ORDER — TRAZODONE HCL 150 MG PO TABS: ORAL_TABLET | ORAL | Status: DC

## 2015-02-07 MED ORDER — LISINOPRIL 10 MG PO TABS: 10.0000 mg | ORAL_TABLET | Freq: Every day | ORAL | Status: DC

## 2015-02-07 MED ORDER — METOPROLOL SUCCINATE ER 25 MG PO TB24: 25.0000 mg | ORAL_TABLET | Freq: Every day | ORAL | Status: DC

## 2015-02-07 NOTE — Patient Instructions (Signed)
Enouraged him to exercise 30-45 minutes 4-5 times per week. Eat a well balanced diet. Avoid smoking. Limit alcohol intake. Wear seatbelt when riding in the car. Wear sun block (SPF >50) when spending extended times outside.  Continue current meds as ordered  F/u in 4 mos for routine visit

## 2015-02-15 ENCOUNTER — Other Ambulatory Visit: Payer: Self-pay

## 2015-02-15 MED ORDER — OXYCODONE HCL 30 MG PO TABS: ORAL_TABLET | ORAL | Status: DC

## 2015-02-15 NOTE — Telephone Encounter (Signed)
Refill request

## 2015-02-20 ENCOUNTER — Encounter: Payer: Self-pay | Admitting: Internal Medicine

## 2015-03-15 ENCOUNTER — Other Ambulatory Visit: Payer: Self-pay | Admitting: *Deleted

## 2015-03-15 ENCOUNTER — Other Ambulatory Visit: Payer: Medicare Other

## 2015-03-15 ENCOUNTER — Other Ambulatory Visit: Payer: Self-pay

## 2015-03-15 DIAGNOSIS — M545 Low back pain, unspecified: Secondary | ICD-10-CM

## 2015-03-15 DIAGNOSIS — G8929 Other chronic pain: Secondary | ICD-10-CM | POA: Diagnosis not present

## 2015-03-15 MED ORDER — PANTOPRAZOLE SODIUM 20 MG PO TBEC: DELAYED_RELEASE_TABLET | ORAL | Status: DC

## 2015-03-15 MED ORDER — OXYCODONE HCL 30 MG PO TABS: ORAL_TABLET | ORAL | Status: DC

## 2015-03-15 NOTE — Telephone Encounter (Signed)
Patient requested and will pick up 

## 2015-03-15 NOTE — Telephone Encounter (Signed)
[facility] 

## 2015-03-16 LAB — DRUG SCREEN, URINE
AMPHETAMINES, URINE: POSITIVE ng/mL
BARBITURATE SCREEN URINE: NEGATIVE ng/mL
BENZODIAZEPINE QUANT UR: NEGATIVE ng/mL
Cannabinoid Quant, Ur: POSITIVE ng/mL
Cocaine (Metab.): NEGATIVE ng/mL
OPIATE QUANT UR: NEGATIVE ng/mL
PCP Quant, Ur: NEGATIVE ng/mL

## 2015-03-16 LAB — OXYCODONE/OXYMORPHONE, URINE: OXYCODONE+OXYMORPHONE UR QL SCN: NEGATIVE ng/mL

## 2015-04-02 DIAGNOSIS — E559 Vitamin D deficiency, unspecified: Secondary | ICD-10-CM | POA: Diagnosis not present

## 2015-04-02 DIAGNOSIS — I10 Essential (primary) hypertension: Secondary | ICD-10-CM | POA: Diagnosis not present

## 2015-04-02 DIAGNOSIS — M545 Low back pain: Secondary | ICD-10-CM | POA: Diagnosis not present

## 2015-04-02 DIAGNOSIS — R5381 Other malaise: Secondary | ICD-10-CM | POA: Diagnosis not present

## 2015-04-02 DIAGNOSIS — R0602 Shortness of breath: Secondary | ICD-10-CM | POA: Diagnosis not present

## 2015-04-02 DIAGNOSIS — E782 Mixed hyperlipidemia: Secondary | ICD-10-CM | POA: Diagnosis not present

## 2015-04-02 DIAGNOSIS — E785 Hyperlipidemia, unspecified: Secondary | ICD-10-CM | POA: Diagnosis not present

## 2015-04-10 DIAGNOSIS — I349 Nonrheumatic mitral valve disorder, unspecified: Secondary | ICD-10-CM | POA: Diagnosis not present

## 2015-04-11 DIAGNOSIS — I6529 Occlusion and stenosis of unspecified carotid artery: Secondary | ICD-10-CM | POA: Diagnosis not present

## 2015-04-11 DIAGNOSIS — I349 Nonrheumatic mitral valve disorder, unspecified: Secondary | ICD-10-CM | POA: Diagnosis not present

## 2015-04-11 DIAGNOSIS — I790 Aneurysm of aorta in diseases classified elsewhere: Secondary | ICD-10-CM | POA: Diagnosis not present

## 2015-04-16 ENCOUNTER — Telehealth: Payer: Self-pay | Admitting: *Deleted

## 2015-04-16 NOTE — Telephone Encounter (Signed)
Patient returned call and stated that he has been down in [place] his sister died today and his truck broke down on the way home and he is stuck in [place]. Patient wants his pain medication. I explained to him that we will no longer be prescribing due to his Drug Screen and he stated that yes he smoked Marijuana due to running out of Trazadone to help him sleep. And the Oxycodone was not in his system because he ran out of them 2-3 days prior to having the drug screen done. Wants to speak with you personally and wants you to call him [phone]. Please Advise.

## 2015-04-16 NOTE — Telephone Encounter (Signed)
The answer is still no - due to recent controlled substance policy initiation AND his hx illicit drug use. Refer to pain clinic. Thanks

## 2015-04-16 NOTE — Telephone Encounter (Signed)
Called and LM with patient to return call.

## 2015-04-16 NOTE — Telephone Encounter (Signed)
Patient called and left voicemail message stating that he needed a refill on his pain medication. I tried calling patient back and left message for him to return my call. Patient cannot receive pain medication from [place] no longer due to POSITIVE drug screening.

## 2015-04-19 DIAGNOSIS — I70219 Atherosclerosis of native arteries of extremities with intermittent claudication, unspecified extremity: Secondary | ICD-10-CM | POA: Diagnosis not present

## 2015-04-19 DIAGNOSIS — I482 Chronic atrial fibrillation: Secondary | ICD-10-CM | POA: Diagnosis not present

## 2015-04-19 DIAGNOSIS — I209 Angina pectoris, unspecified: Secondary | ICD-10-CM | POA: Diagnosis not present

## 2015-04-19 DIAGNOSIS — I25119 Atherosclerotic heart disease of native coronary artery with unspecified angina pectoris: Secondary | ICD-10-CM | POA: Diagnosis not present

## 2015-04-22 DIAGNOSIS — I4891 Unspecified atrial fibrillation: Secondary | ICD-10-CM | POA: Diagnosis not present

## 2015-04-22 DIAGNOSIS — M545 Low back pain: Secondary | ICD-10-CM | POA: Diagnosis not present

## 2015-04-25 DIAGNOSIS — M4806 Spinal stenosis, lumbar region: Secondary | ICD-10-CM | POA: Diagnosis not present

## 2015-05-02 DIAGNOSIS — M5416 Radiculopathy, lumbar region: Secondary | ICD-10-CM | POA: Diagnosis not present

## 2015-05-02 DIAGNOSIS — G9009 Other idiopathic peripheral autonomic neuropathy: Secondary | ICD-10-CM | POA: Diagnosis not present

## 2015-05-02 DIAGNOSIS — G609 Hereditary and idiopathic neuropathy, unspecified: Secondary | ICD-10-CM | POA: Diagnosis not present

## 2015-05-07 DIAGNOSIS — M5126 Other intervertebral disc displacement, lumbar region: Secondary | ICD-10-CM | POA: Diagnosis not present

## 2015-05-07 DIAGNOSIS — Z7901 Long term (current) use of anticoagulants: Secondary | ICD-10-CM | POA: Diagnosis not present

## 2015-05-07 DIAGNOSIS — Z79891 Long term (current) use of opiate analgesic: Secondary | ICD-10-CM | POA: Diagnosis not present

## 2015-05-07 DIAGNOSIS — M5416 Radiculopathy, lumbar region: Secondary | ICD-10-CM | POA: Diagnosis not present

## 2015-05-07 DIAGNOSIS — G894 Chronic pain syndrome: Secondary | ICD-10-CM | POA: Diagnosis not present

## 2015-05-07 DIAGNOSIS — M545 Low back pain: Secondary | ICD-10-CM | POA: Diagnosis not present

## 2015-05-07 DIAGNOSIS — M1288 Other specific arthropathies, not elsewhere classified, other specified site: Secondary | ICD-10-CM | POA: Diagnosis not present

## 2015-05-07 DIAGNOSIS — I10 Essential (primary) hypertension: Secondary | ICD-10-CM | POA: Diagnosis not present

## 2015-05-20 DIAGNOSIS — I10 Essential (primary) hypertension: Secondary | ICD-10-CM | POA: Diagnosis not present

## 2015-05-20 DIAGNOSIS — M545 Low back pain: Secondary | ICD-10-CM | POA: Diagnosis not present

## 2015-05-20 DIAGNOSIS — I4891 Unspecified atrial fibrillation: Secondary | ICD-10-CM | POA: Diagnosis not present

## 2015-05-20 DIAGNOSIS — E669 Obesity, unspecified: Secondary | ICD-10-CM | POA: Diagnosis not present

## 2015-05-22 ENCOUNTER — Encounter: Payer: Self-pay | Admitting: Internal Medicine

## 2015-05-22 ENCOUNTER — Ambulatory Visit: Payer: Medicare Other | Admitting: Internal Medicine

## 2015-05-30 DIAGNOSIS — M47817 Spondylosis without myelopathy or radiculopathy, lumbosacral region: Secondary | ICD-10-CM | POA: Diagnosis not present

## 2015-05-30 DIAGNOSIS — M533 Sacrococcygeal disorders, not elsewhere classified: Secondary | ICD-10-CM | POA: Diagnosis not present

## 2015-05-30 DIAGNOSIS — M5416 Radiculopathy, lumbar region: Secondary | ICD-10-CM | POA: Diagnosis not present

## 2015-07-01 DIAGNOSIS — M5416 Radiculopathy, lumbar region: Secondary | ICD-10-CM | POA: Diagnosis not present

## 2015-07-01 DIAGNOSIS — G894 Chronic pain syndrome: Secondary | ICD-10-CM | POA: Diagnosis not present

## 2015-07-01 DIAGNOSIS — M5136 Other intervertebral disc degeneration, lumbar region: Secondary | ICD-10-CM | POA: Diagnosis not present

## 2015-07-01 DIAGNOSIS — Z7901 Long term (current) use of anticoagulants: Secondary | ICD-10-CM | POA: Diagnosis not present

## 2015-07-01 DIAGNOSIS — M545 Low back pain: Secondary | ICD-10-CM | POA: Diagnosis not present

## 2015-07-01 DIAGNOSIS — M5126 Other intervertebral disc displacement, lumbar region: Secondary | ICD-10-CM | POA: Diagnosis not present

## 2015-07-01 DIAGNOSIS — I10 Essential (primary) hypertension: Secondary | ICD-10-CM | POA: Diagnosis not present

## 2015-07-01 DIAGNOSIS — M1288 Other specific arthropathies, not elsewhere classified, other specified site: Secondary | ICD-10-CM | POA: Diagnosis not present

## 2015-07-02 DIAGNOSIS — M545 Low back pain: Secondary | ICD-10-CM | POA: Diagnosis not present

## 2015-07-02 DIAGNOSIS — I10 Essential (primary) hypertension: Secondary | ICD-10-CM | POA: Diagnosis not present

## 2015-07-17 ENCOUNTER — Telehealth: Payer: Self-pay | Admitting: *Deleted

## 2015-07-17 NOTE — Telephone Encounter (Signed)
Patient called and left message stating he wants an appointment to a Pain Clinic.  Called patient back and left message that we would need to see him to schedule a referral and to call the office to schedule appointment with Dr. Eulas Post. Patient had an appointment in December he no showed.

## 2015-07-22 DIAGNOSIS — M545 Low back pain: Secondary | ICD-10-CM | POA: Diagnosis not present

## 2015-07-22 DIAGNOSIS — M4800 Spinal stenosis, site unspecified: Secondary | ICD-10-CM | POA: Diagnosis not present

## 2015-07-24 ENCOUNTER — Ambulatory Visit: Payer: Medicare Other | Admitting: Internal Medicine

## 2015-07-28 DIAGNOSIS — M549 Dorsalgia, unspecified: Secondary | ICD-10-CM | POA: Diagnosis not present

## 2015-07-28 DIAGNOSIS — G8929 Other chronic pain: Secondary | ICD-10-CM | POA: Diagnosis not present

## 2015-08-14 DIAGNOSIS — M1990 Unspecified osteoarthritis, unspecified site: Secondary | ICD-10-CM | POA: Diagnosis not present

## 2015-08-14 DIAGNOSIS — I10 Essential (primary) hypertension: Secondary | ICD-10-CM | POA: Diagnosis not present

## 2015-08-14 DIAGNOSIS — M545 Low back pain: Secondary | ICD-10-CM | POA: Diagnosis not present

## 2015-08-25 ENCOUNTER — Emergency Department (HOSPITAL_COMMUNITY): Payer: Medicare Other

## 2015-08-25 ENCOUNTER — Emergency Department (HOSPITAL_COMMUNITY)
Admission: EM | Admit: 2015-08-25 | Discharge: 2015-08-25 | Disposition: A | Discharge status: Home / Self Care | Payer: Medicare Other | Source: Home / Self Care | Attending: Emergency Medicine | Admitting: Emergency Medicine

## 2015-08-25 ENCOUNTER — Observation Stay (HOSPITAL_COMMUNITY)
Admission: EM | Admit: 2015-08-25 | Discharge: 2015-08-28 | Disposition: A | Discharge status: Home / Self Care | Payer: Medicare Other | Attending: Internal Medicine | Admitting: Internal Medicine

## 2015-08-25 ENCOUNTER — Encounter (HOSPITAL_COMMUNITY): Payer: Self-pay

## 2015-08-25 DIAGNOSIS — J449 Chronic obstructive pulmonary disease, unspecified: Secondary | ICD-10-CM | POA: Insufficient documentation

## 2015-08-25 DIAGNOSIS — Z9884 Bariatric surgery status: Secondary | ICD-10-CM | POA: Insufficient documentation

## 2015-08-25 DIAGNOSIS — S0281XA Fracture of other specified skull and facial bones, right side, initial encounter for closed fracture: Secondary | ICD-10-CM | POA: Insufficient documentation

## 2015-08-25 DIAGNOSIS — M7989 Other specified soft tissue disorders: Secondary | ICD-10-CM

## 2015-08-25 DIAGNOSIS — I482 Chronic atrial fibrillation, unspecified: Secondary | ICD-10-CM | POA: Diagnosis present

## 2015-08-25 DIAGNOSIS — F40240 Claustrophobia: Secondary | ICD-10-CM | POA: Diagnosis not present

## 2015-08-25 DIAGNOSIS — Z7901 Long term (current) use of anticoagulants: Secondary | ICD-10-CM | POA: Insufficient documentation

## 2015-08-25 DIAGNOSIS — Z79899 Other long term (current) drug therapy: Secondary | ICD-10-CM | POA: Insufficient documentation

## 2015-08-25 DIAGNOSIS — Z888 Allergy status to other drugs, medicaments and biological substances status: Secondary | ICD-10-CM | POA: Insufficient documentation

## 2015-08-25 DIAGNOSIS — R0789 Other chest pain: Secondary | ICD-10-CM | POA: Diagnosis not present

## 2015-08-25 DIAGNOSIS — I10 Essential (primary) hypertension: Secondary | ICD-10-CM | POA: Diagnosis present

## 2015-08-25 DIAGNOSIS — M1990 Unspecified osteoarthritis, unspecified site: Secondary | ICD-10-CM

## 2015-08-25 DIAGNOSIS — S0230XA Fracture of orbital floor, unspecified side, initial encounter for closed fracture: Secondary | ICD-10-CM

## 2015-08-25 DIAGNOSIS — S02402A Zygomatic fracture, unspecified, initial encounter for closed fracture: Secondary | ICD-10-CM | POA: Diagnosis not present

## 2015-08-25 DIAGNOSIS — Z8669 Personal history of other diseases of the nervous system and sense organs: Secondary | ICD-10-CM | POA: Insufficient documentation

## 2015-08-25 DIAGNOSIS — Z23 Encounter for immunization: Secondary | ICD-10-CM | POA: Diagnosis not present

## 2015-08-25 DIAGNOSIS — S0240EA Zygomatic fracture, right side, initial encounter for closed fracture: Secondary | ICD-10-CM | POA: Insufficient documentation

## 2015-08-25 DIAGNOSIS — H1131 Conjunctival hemorrhage, right eye: Secondary | ICD-10-CM

## 2015-08-25 DIAGNOSIS — G3189 Other specified degenerative diseases of nervous system: Secondary | ICD-10-CM | POA: Diagnosis not present

## 2015-08-25 DIAGNOSIS — S0511XA Contusion of eyeball and orbital tissues, right eye, initial encounter: Secondary | ICD-10-CM | POA: Diagnosis not present

## 2015-08-25 DIAGNOSIS — G8929 Other chronic pain: Secondary | ICD-10-CM | POA: Insufficient documentation

## 2015-08-25 DIAGNOSIS — G4730 Sleep apnea, unspecified: Secondary | ICD-10-CM | POA: Diagnosis not present

## 2015-08-25 DIAGNOSIS — S3992XA Unspecified injury of lower back, initial encounter: Secondary | ICD-10-CM | POA: Insufficient documentation

## 2015-08-25 DIAGNOSIS — K219 Gastro-esophageal reflux disease without esophagitis: Secondary | ICD-10-CM | POA: Insufficient documentation

## 2015-08-25 DIAGNOSIS — E876 Hypokalemia: Secondary | ICD-10-CM | POA: Diagnosis not present

## 2015-08-25 DIAGNOSIS — Y9389 Activity, other specified: Secondary | ICD-10-CM | POA: Insufficient documentation

## 2015-08-25 DIAGNOSIS — I495 Sick sinus syndrome: Secondary | ICD-10-CM | POA: Diagnosis not present

## 2015-08-25 DIAGNOSIS — R072 Precordial pain: Secondary | ICD-10-CM

## 2015-08-25 DIAGNOSIS — Y9289 Other specified places as the place of occurrence of the external cause: Secondary | ICD-10-CM

## 2015-08-25 DIAGNOSIS — I872 Venous insufficiency (chronic) (peripheral): Secondary | ICD-10-CM | POA: Insufficient documentation

## 2015-08-25 DIAGNOSIS — Z8673 Personal history of transient ischemic attack (TIA), and cerebral infarction without residual deficits: Secondary | ICD-10-CM | POA: Insufficient documentation

## 2015-08-25 DIAGNOSIS — Z87891 Personal history of nicotine dependence: Secondary | ICD-10-CM | POA: Insufficient documentation

## 2015-08-25 DIAGNOSIS — M549 Dorsalgia, unspecified: Secondary | ICD-10-CM | POA: Insufficient documentation

## 2015-08-25 DIAGNOSIS — R51 Headache: Secondary | ICD-10-CM | POA: Insufficient documentation

## 2015-08-25 DIAGNOSIS — R079 Chest pain, unspecified: Secondary | ICD-10-CM | POA: Diagnosis present

## 2015-08-25 DIAGNOSIS — S0240CA Maxillary fracture, right side, initial encounter for closed fracture: Secondary | ICD-10-CM | POA: Insufficient documentation

## 2015-08-25 DIAGNOSIS — Y998 Other external cause status: Secondary | ICD-10-CM

## 2015-08-25 DIAGNOSIS — T7411XA Adult physical abuse, confirmed, initial encounter: Secondary | ICD-10-CM | POA: Diagnosis not present

## 2015-08-25 DIAGNOSIS — S0280XA Fracture of other specified skull and facial bones, unspecified side, initial encounter for closed fracture: Secondary | ICD-10-CM

## 2015-08-25 DIAGNOSIS — S02401A Maxillary fracture, unspecified, initial encounter for closed fracture: Secondary | ICD-10-CM

## 2015-08-25 HISTORY — DX: Cerebral infarction, unspecified: I63.9

## 2015-08-25 HISTORY — DX: Essential (primary) hypertension: I10

## 2015-08-25 LAB — CBC
HCT: 41.6 % (ref 39.0–52.0)
HEMOGLOBIN: 13.4 g/dL (ref 13.0–17.0)
MCH: 28.6 pg (ref 26.0–34.0)
MCHC: 32.2 g/dL (ref 30.0–36.0)
MCV: 88.7 fL (ref 78.0–100.0)
Platelets: 213 10*3/uL (ref 150–400)
RBC: 4.69 MIL/uL (ref 4.22–5.81)
RDW: 13.3 % (ref 11.5–15.5)
WBC: 7.9 10*3/uL (ref 4.0–10.5)

## 2015-08-25 LAB — COMPREHENSIVE METABOLIC PANEL
ALK PHOS: 69 U/L (ref 38–126)
ALT: 12 U/L — ABNORMAL LOW (ref 17–63)
ANION GAP: 9 (ref 5–15)
AST: 25 U/L (ref 15–41)
Albumin: 3.3 g/dL — ABNORMAL LOW (ref 3.5–5.0)
BILIRUBIN TOTAL: 0.7 mg/dL (ref 0.3–1.2)
BUN: 12 mg/dL (ref 6–20)
CALCIUM: 9.2 mg/dL (ref 8.9–10.3)
CO2: 28 mmol/L (ref 22–32)
Chloride: 104 mmol/L (ref 101–111)
Creatinine, Ser: 0.95 mg/dL (ref 0.61–1.24)
GFR calc non Af Amer: >60 mL/min (ref >60)
Glucose, Bld: 164 mg/dL — ABNORMAL HIGH (ref 65–99)
Potassium: 3.2 mmol/L — ABNORMAL LOW (ref 3.5–5.1)
SODIUM: 141 mmol/L (ref 135–145)
TOTAL PROTEIN: 6.4 g/dL — AB (ref 6.5–8.1)

## 2015-08-25 LAB — I-STAT TROPONIN, ED: TROPONIN I, POC: 0 ng/mL (ref 0.00–0.08)

## 2015-08-25 IMAGING — DX DG CHEST 2V
3 series · 3 of 3 positions shown · non-contrast
Comparison: [DATE]

CLINICAL DATA: Chest pain

EXAM:
CHEST  2 VIEW

[chest pa]
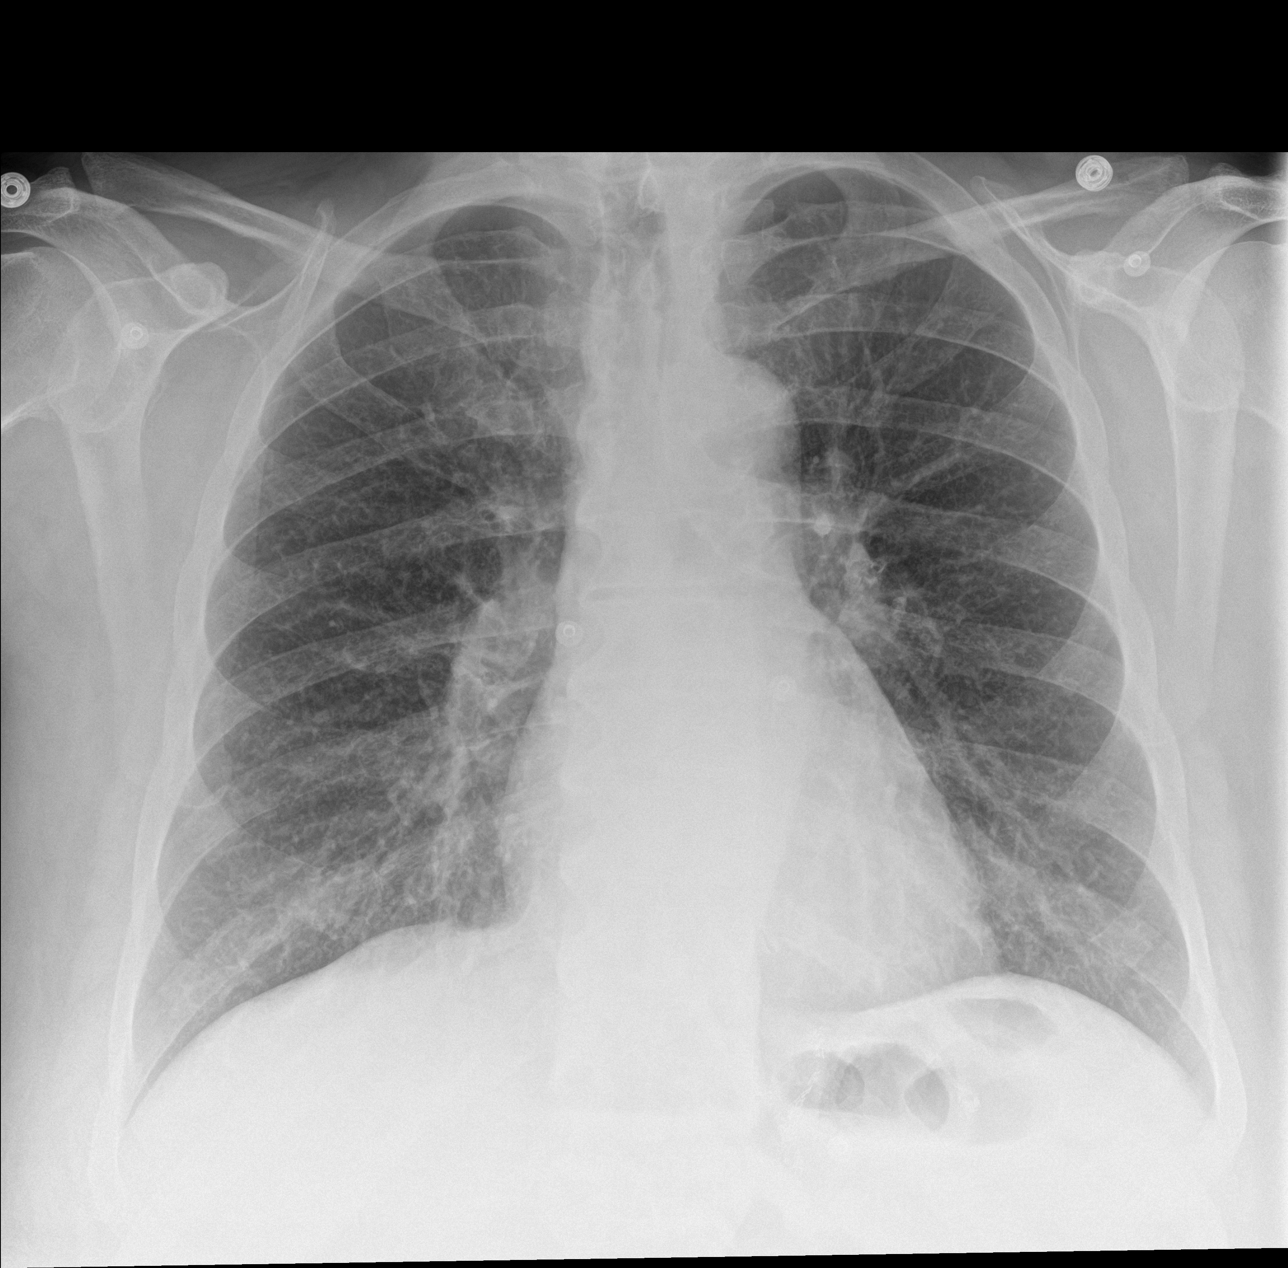

[chest lat (1 of 2)]
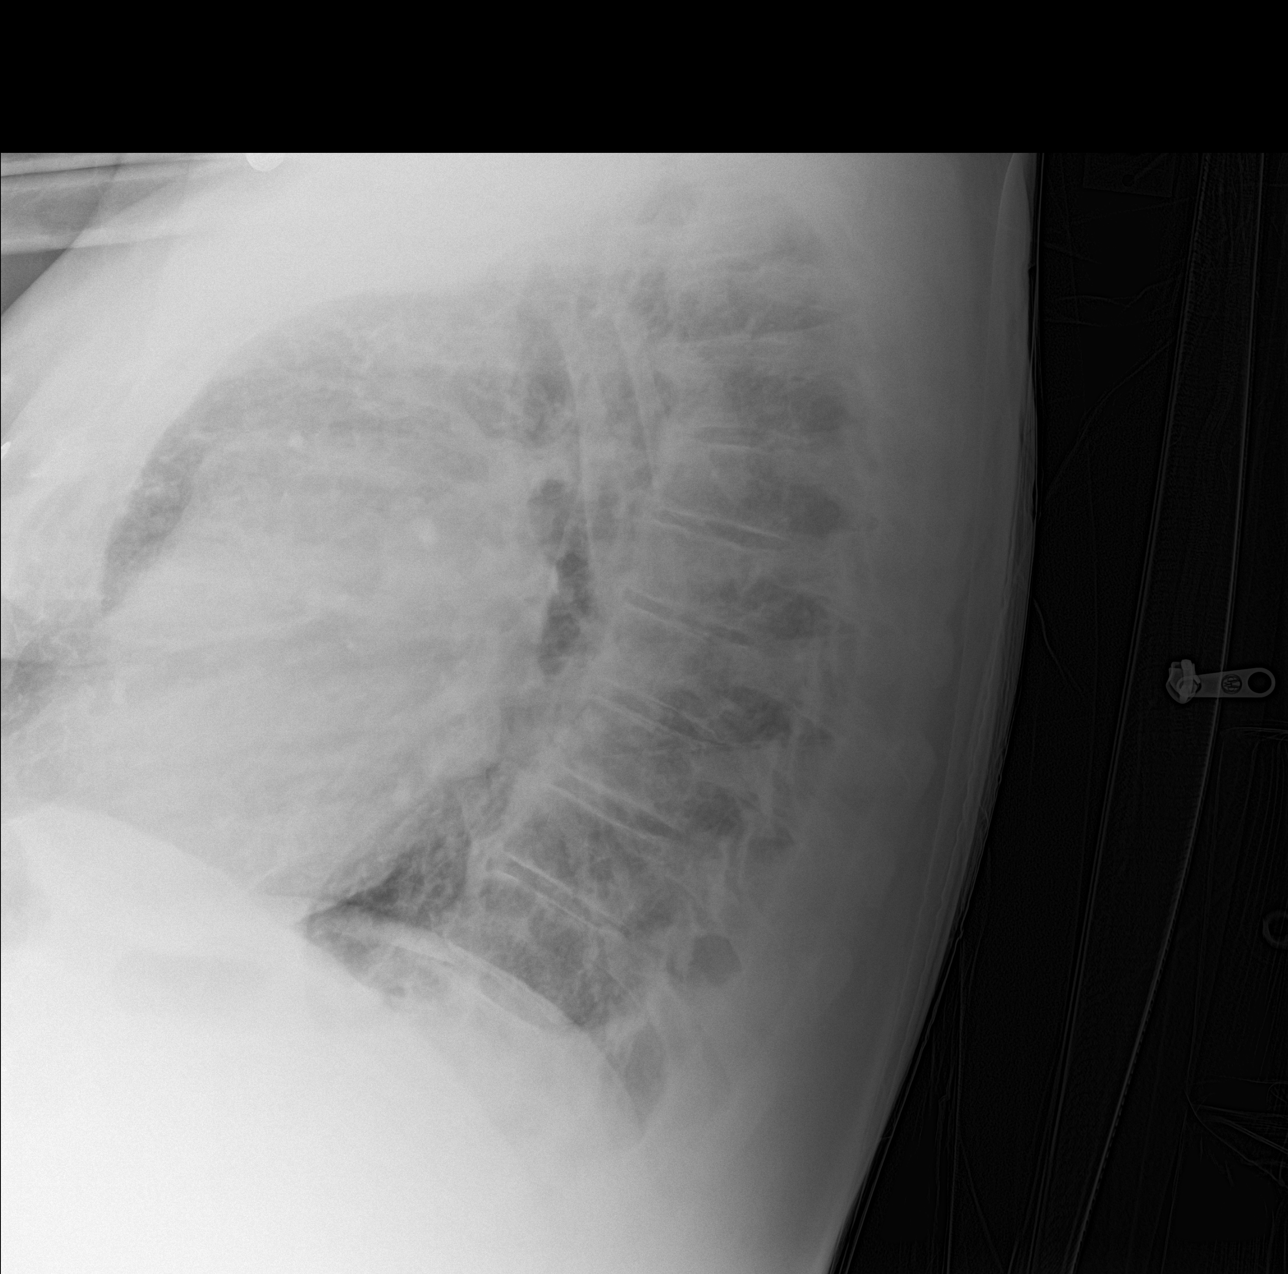

[chest lat (2 of 2)]
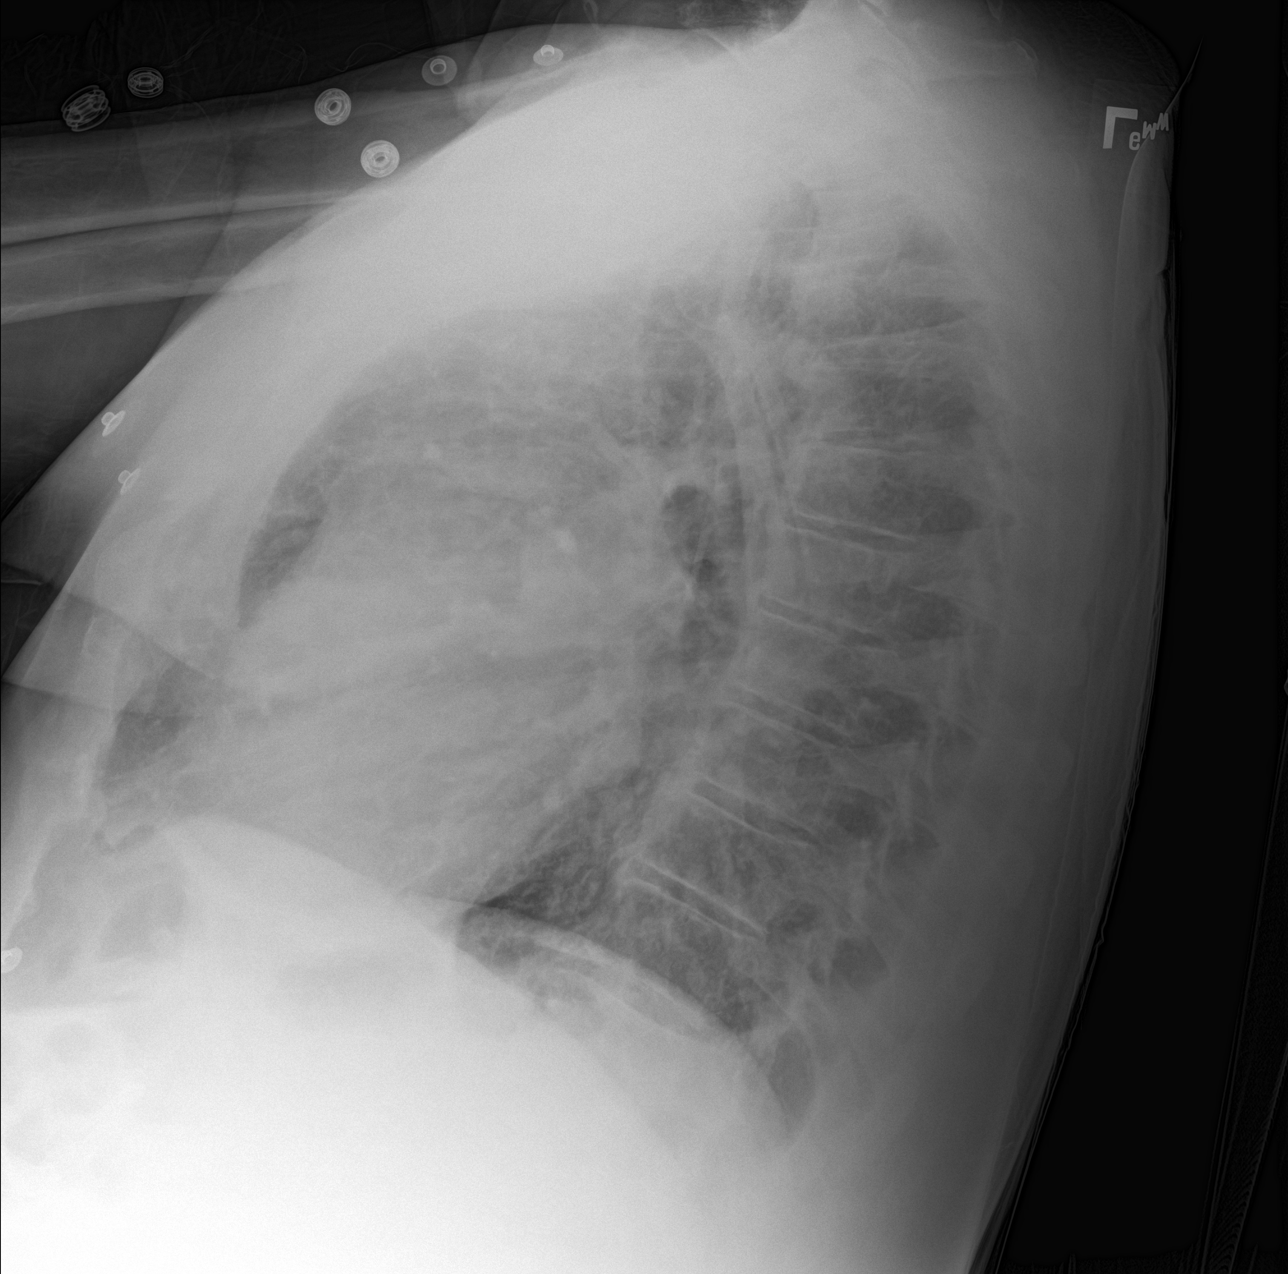

[3 of 3 positions shown; findings below may reference images not displayed]

FINDINGS: Chronic bronchitic markings. There is no edema, consolidation,
effusion, or pneumothorax. Normal heart size and stable aortic
contours. Surgical changes at the GE junction.
IMPRESSION: Chronic bronchitic changes.  No acute finding.
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
## 2015-08-25 MED ORDER — OXYCODONE-ACETAMINOPHEN 5-325 MG PO TABS
1.0000 | ORAL_TABLET | Freq: Once | ORAL | Status: AC
  Administered 2015-08-25: 1 via ORAL
  Filled 2015-08-25: qty 1

## 2015-08-25 MED ORDER — OXYCODONE-ACETAMINOPHEN 5-325 MG PO TABS: 1.0000 | ORAL_TABLET | Freq: Four times a day (QID) | ORAL | Status: DC | PRN
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
## 2015-08-25 NOTE — ED Notes (Signed)
MD at bedside. 

## 2015-08-25 NOTE — Discharge Instructions (Signed)
Subconjunctival Hemorrhage Subconjunctival hemorrhage is bleeding that happens between the white part of your eye (sclera) and the clear membrane that covers the outside of your eye (conjunctiva). There are many tiny blood vessels near the surface of your eye. A subconjunctival hemorrhage happens when one or more of these vessels breaks and bleeds, causing a red patch to appear on your eye. This is similar to a bruise. Depending on the amount of bleeding, the red patch may only cover a small area of your eye or it may cover the entire visible part of the sclera. If a lot of blood collects under the conjunctiva, there may also be swelling. Subconjunctival hemorrhages do not affect your vision or cause pain, but your eye may feel irritated if there is swelling. Subconjunctival hemorrhages usually do not require treatment, and they disappear on their own within two weeks. CAUSES This condition may be caused by:  Mild trauma, such as rubbing your eye too hard.  Severe trauma or blunt injuries.  Coughing, sneezing, or vomiting.  Straining, such as when lifting a heavy object.  High blood pressure.  Recent eye surgery.  A history of diabetes.  Certain medicines, especially blood thinners (anticoagulants).  Other conditions, such as eye tumors, bleeding disorders, or blood vessel abnormalities. Subconjunctival hemorrhages can happen without an obvious cause.  SYMPTOMS  Symptoms of this condition include:  A bright red or dark red patch on the white part of the eye.  The red area may spread out to cover a larger area of the eye before it goes away.  The red area may turn brownish-yellow before it goes away.  Swelling.  Mild eye irritation. DIAGNOSIS This condition is diagnosed with a physical exam. If your subconjunctival hemorrhage was caused by trauma, your health care provider may refer you to an eye specialist (ophthalmologist) or another specialist to check for other injuries. You  may have other tests, including:  An eye exam.  A blood pressure check.  Blood tests to check for bleeding disorders. If your subconjunctival hemorrhage was caused by trauma, X-rays or a CT scan may be done to check for other injuries. TREATMENT Usually, no treatment is needed. Your health care provider may recommend eye drops or cold compresses to help with discomfort. HOME CARE INSTRUCTIONS  Take over-the-counter and prescription medicines only as directed by your health care provider.  Use eye drops or cold compresses to help with discomfort as directed by your health care provider.  Avoid activities, things, and environments that may irritate or injure your eye.  Keep all follow-up visits as told by your health care provider. This is important. SEEK MEDICAL CARE IF:  You have pain in your eye.  The bleeding does not go away within 3 weeks.  You keep getting new subconjunctival hemorrhages. SEEK IMMEDIATE MEDICAL CARE IF:  Your vision changes or you have difficulty seeing.  You suddenly develop severe sensitivity to light.  You develop a severe headache, persistent vomiting, confusion, or abnormal tiredness (lethargy).  Your eye seems to bulge or protrude from your eye socket.  You develop unexplained bruises on your body.  You have unexplained bleeding in another area of your body.   This information is not intended to replace advice given to you by your health care provider. Make sure you discuss any questions you have with your health care provider.   Document Released: 06/01/2005 Document Revised: 02/20/2015 Document Reviewed: 08/08/2014 Elsevier Interactive Patient Education 2016 [place] Fracture A zygoma fracture  is a break in one of the bones in your face. Your zygoma forms the part of your cheekbone that you can feel under your eye. The main part of your zygoma meets the bone that forms the middle part of your face (maxillary bone) under your  eye. Your zygoma also has an arched part that extends along the side of your face to meet the bone that forms the side of your head (temporalbone). A zygoma fracture may involve the main part of the bone, the arch of the bone, or both parts of the bone. CAUSES A zygoma fracture is most often caused by injury or trauma from:  A car accident.   A direct blow to the face.   A sports injury.   A fall. RISK FACTORS You may be more likely to have a zygoma fracture if:  You participate in contact sports.  You are a victim of violence or participate in violent activities or behaviors. SYMPTOMS  Symptoms of a zygoma fracture include:  Swelling.  Bruising.  Pain.  Difficulty or pain when chewing.  A feeling that your teeth are out of line. As swelling goes down, your face may look different because your cheekbone is flat or set back (depressed). If the fracture extends into bones that supports your eye (blowout fracture), you may have double vision or numbness in your cheek.  DIAGNOSIS  Your health care provider may suspect a zygoma fracture based on your symptoms, especially if you had a recent injury. Your health care provider will perform a physical exam to check your cheekbone area and feel whether your zygoma is depressed or separated. Your health care provider may also do other tests to confirm the diagnosis and check for other injuries. These may include:   X-rays.   CT scan.   Eye exam. TREATMENT  Treatment depends on the type of fracture you have and how severe it is. You may have to wait for treatment until the swelling and inflammation decreases.   If you have a fracture that does not cause any deformity or change in your chewing (non-displaced fracture), you may not need treatment.  If you have trouble opening your mouth or you have a cheekbone deformity (displaced fracture), you may need surgery. Surgery may involve either:  A closed reduction. A small surgical  cut (incision) is made inside your mouth or on the side of your head. The surgeon inserts a smooth, blunt surgical instrument through the incision to lift the bone back into proper position.  An open reduction. This may require a small incision over the fracture site. The fracture is put back into proper position. It will be held in place with wires or with screws and metal plates. HOME CARE INSTRUCTIONS   Take medicines only as directed by your health care provider.  Ask your health care provider whether you can use an ice pack on your cheek to relieve swelling before or after surgery. If directed, apply ice to the injured area:  Put ice in a plastic bag.  Place a towel between your skin and the bag.  Leave the ice on for 20 minutes, 2-3 times per day.  Eat a soft or liquid diet until your health care provider says it is okay for you to chew.  Wash and dry your face gently.  Do not participate in activities that put you at risk for injuring the area again.  Wear protective gear as directed by your health care provider, especially when participating  in sports or activities that put you at risk for re-injury.   Keep all follow-up visits as directed by your health care provider. This is important. SEEK MEDICAL CARE IF:   Pain or inflammation does not decrease with medicines.  Swelling or bruising of the injured area gets worse.  You develop any vision problems.  You have a lot of clear watery discharge from your nose.  You have a fever.  You have nausea or vomiting. SEEK IMMEDIATE MEDICAL CARE IF:  You have trouble moving your mouth.  You have trouble breathing or swallowing.  You develop a severe headache.   This information is not intended to replace advice given to you by your health care provider. Make sure you discuss any questions you have with your health care provider.   Document Released: 02/24/2001 Document Revised: 06/22/2014 Document Reviewed:  03/07/2014 Elsevier Interactive Patient Education [employer].
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
## 2015-08-25 NOTE — ED Notes (Signed)
Sheriff at bedside speaking with pt.

## 2015-08-25 NOTE — ED Notes (Signed)
Pt requesting pain medication, MD aware
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
## 2015-08-26 ENCOUNTER — Observation Stay (HOSPITAL_COMMUNITY)
Admit: 2015-08-26 | Discharge: 2015-08-26 | Disposition: A | Discharge status: Home / Self Care | Payer: Medicare Other | Attending: Internal Medicine | Admitting: Internal Medicine

## 2015-08-26 ENCOUNTER — Encounter (HOSPITAL_COMMUNITY): Payer: Self-pay | Admitting: Internal Medicine

## 2015-08-26 DIAGNOSIS — R079 Chest pain, unspecified: Secondary | ICD-10-CM | POA: Diagnosis not present

## 2015-08-26 DIAGNOSIS — I482 Chronic atrial fibrillation, unspecified: Secondary | ICD-10-CM | POA: Diagnosis present

## 2015-08-26 DIAGNOSIS — R072 Precordial pain: Secondary | ICD-10-CM | POA: Insufficient documentation

## 2015-08-26 DIAGNOSIS — M7989 Other specified soft tissue disorders: Secondary | ICD-10-CM

## 2015-08-26 DIAGNOSIS — I209 Angina pectoris, unspecified: Secondary | ICD-10-CM

## 2015-08-26 DIAGNOSIS — I10 Essential (primary) hypertension: Secondary | ICD-10-CM | POA: Diagnosis not present

## 2015-08-26 HISTORY — DX: Chest pain, unspecified: R07.9

## 2015-08-26 LAB — CBC
HCT: 41.2 % (ref 39.0–52.0)
Hemoglobin: 13.5 g/dL (ref 13.0–17.0)
MCH: 29 pg (ref 26.0–34.0)
MCHC: 32.8 g/dL (ref 30.0–36.0)
MCV: 88.4 fL (ref 78.0–100.0)
PLATELETS: 211 10*3/uL (ref 150–400)
RBC: 4.66 MIL/uL (ref 4.22–5.81)
RDW: 13.5 % (ref 11.5–15.5)
WBC: 6.3 10*3/uL (ref 4.0–10.5)

## 2015-08-26 LAB — URINALYSIS, ROUTINE W REFLEX MICROSCOPIC
GLUCOSE, UA: NEGATIVE mg/dL
HGB URINE DIPSTICK: NEGATIVE
Ketones, ur: NEGATIVE mg/dL
Leukocytes, UA: NEGATIVE
Nitrite: NEGATIVE
Protein, ur: NEGATIVE mg/dL
SPECIFIC GRAVITY, URINE: 1.025 (ref 1.005–1.030)
pH: 6 (ref 5.0–8.0)

## 2015-08-26 LAB — RAPID URINE DRUG SCREEN, HOSP PERFORMED
AMPHETAMINES: POSITIVE — AB
BENZODIAZEPINES: POSITIVE — AB
Barbiturates: NOT DETECTED
Cocaine: NOT DETECTED
Opiates: POSITIVE — AB
TETRAHYDROCANNABINOL: NOT DETECTED

## 2015-08-26 LAB — TROPONIN I
Troponin I: 0.04 ng/mL — ABNORMAL HIGH (ref <0.031)
Troponin I: <0.03 ng/mL (ref <0.031)

## 2015-08-26 LAB — CREATININE, SERUM
CREATININE: 0.76 mg/dL (ref 0.61–1.24)
GFR calc non Af Amer: >60 mL/min (ref >60)

## 2015-08-26 LAB — I-STAT TROPONIN, ED
Troponin i, poc: 0.01 ng/mL (ref 0.00–0.08)
Troponin i, poc: 0.01 ng/mL (ref 0.00–0.08)

## 2015-08-26 LAB — MRSA PCR SCREENING: MRSA BY PCR: NEGATIVE

## 2015-08-26 MED ORDER — MORPHINE SULFATE (PF) 2 MG/ML IV SOLN
2.0000 mg | INTRAVENOUS | Status: DC | PRN
Start: 2015-08-26 — End: 2015-08-26
  Administered 2015-08-26: 2 mg via INTRAVENOUS
  Filled 2015-08-26: qty 1

## 2015-08-26 MED ORDER — ACETAMINOPHEN 325 MG PO TABS: 650.0000 mg | ORAL_TABLET | ORAL | Status: DC | PRN

## 2015-08-26 MED ORDER — DABIGATRAN ETEXILATE MESYLATE 150 MG PO CAPS
150.0000 mg | ORAL_CAPSULE | Freq: Two times a day (BID) | ORAL | Status: DC
  Administered 2015-08-26 – 2015-08-28 (×5): 150 mg via ORAL
  Filled 2015-08-26 (×7): qty 1

## 2015-08-26 MED ORDER — TIOTROPIUM BROMIDE MONOHYDRATE 18 MCG IN CAPS
18.0000 ug | ORAL_CAPSULE | Freq: Every day | RESPIRATORY_TRACT | Status: DC
  Administered 2015-08-26 – 2015-08-28 (×3): 18 ug via RESPIRATORY_TRACT
  Filled 2015-08-26: qty 5

## 2015-08-26 MED ORDER — TRAZODONE HCL 50 MG PO TABS
150.0000 mg | ORAL_TABLET | Freq: Every day | ORAL | Status: DC
  Administered 2015-08-26 – 2015-08-27 (×2): 150 mg via ORAL
  Filled 2015-08-26 (×2): qty 1

## 2015-08-26 MED ORDER — ACETAMINOPHEN 325 MG PO TABS
650.0000 mg | ORAL_TABLET | Freq: Once | ORAL | Status: AC
Start: 2015-08-26 — End: 2015-08-26
  Administered 2015-08-26: 650 mg via ORAL
  Filled 2015-08-26: qty 2

## 2015-08-26 MED ORDER — MORPHINE SULFATE (PF) 2 MG/ML IV SOLN
1.0000 mg | INTRAVENOUS | Status: DC | PRN
Start: 2015-08-26 — End: 2015-08-28
  Administered 2015-08-26: 1 mg via INTRAVENOUS
  Filled 2015-08-26: qty 1

## 2015-08-26 MED ORDER — ALBUTEROL SULFATE (2.5 MG/3ML) 0.083% IN NEBU: 2.5000 mg | INHALATION_SOLUTION | RESPIRATORY_TRACT | Status: DC | PRN

## 2015-08-26 MED ORDER — HEPARIN SODIUM (PORCINE) 5000 UNIT/ML IJ SOLN
5000.0000 [IU] | Freq: Three times a day (TID) | INTRAMUSCULAR | Status: DC
  Administered 2015-08-26: 5000 [IU] via SUBCUTANEOUS
  Filled 2015-08-26: qty 1

## 2015-08-26 MED ORDER — INFLUENZA VAC SPLIT QUAD 0.5 ML IM SUSY
0.5000 mL | PREFILLED_SYRINGE | INTRAMUSCULAR | Status: AC
  Administered 2015-08-27: 0.5 mL via INTRAMUSCULAR
  Filled 2015-08-26: qty 0.5

## 2015-08-26 MED ORDER — ALBUTEROL SULFATE HFA 108 (90 BASE) MCG/ACT IN AERS: 2.0000 | INHALATION_SPRAY | RESPIRATORY_TRACT | Status: DC | PRN

## 2015-08-26 MED ORDER — ONDANSETRON HCL 4 MG/2ML IJ SOLN: 4.0000 mg | Freq: Four times a day (QID) | INTRAMUSCULAR | Status: DC | PRN

## 2015-08-26 MED ORDER — POTASSIUM CHLORIDE CRYS ER 20 MEQ PO TBCR
40.0000 meq | EXTENDED_RELEASE_TABLET | Freq: Once | ORAL | Status: AC
  Administered 2015-08-26: 40 meq via ORAL
  Filled 2015-08-26: qty 2

## 2015-08-26 MED ORDER — ASPIRIN EC 325 MG PO TBEC
325.0000 mg | DELAYED_RELEASE_TABLET | Freq: Every day | ORAL | Status: DC
  Administered 2015-08-26 – 2015-08-28 (×3): 325 mg via ORAL
  Filled 2015-08-26 (×4): qty 1

## 2015-08-26 MED ORDER — PNEUMOCOCCAL VAC POLYVALENT 25 MCG/0.5ML IJ INJ
0.5000 mL | INJECTION | INTRAMUSCULAR | Status: AC
  Administered 2015-08-27: 0.5 mL via INTRAMUSCULAR
  Filled 2015-08-26: qty 1
  Filled 2015-08-26: qty 0.5

## 2015-08-26 MED ORDER — PANTOPRAZOLE SODIUM 40 MG PO TBEC
40.0000 mg | DELAYED_RELEASE_TABLET | Freq: Every day | ORAL | Status: DC
  Administered 2015-08-26 – 2015-08-28 (×3): 40 mg via ORAL
  Filled 2015-08-26 (×3): qty 1

## 2015-08-26 MED ORDER — METOPROLOL SUCCINATE ER 25 MG PO TB24
25.0000 mg | ORAL_TABLET | Freq: Every day | ORAL | Status: DC
  Administered 2015-08-26 – 2015-08-28 (×2): 25 mg via ORAL
  Filled 2015-08-26 (×2): qty 1

## 2015-08-26 MED ORDER — OXYCODONE HCL 5 MG PO TABS
30.0000 mg | ORAL_TABLET | Freq: Four times a day (QID) | ORAL | Status: DC | PRN
  Administered 2015-08-26 – 2015-08-28 (×6): 30 mg via ORAL
  Filled 2015-08-26 (×6): qty 6

## 2015-08-26 MED ORDER — LISINOPRIL 10 MG PO TABS
10.0000 mg | ORAL_TABLET | Freq: Every day | ORAL | Status: DC
  Administered 2015-08-26 – 2015-08-28 (×3): 10 mg via ORAL
  Filled 2015-08-26 (×3): qty 1

## 2015-08-26 MED ORDER — POTASSIUM CHLORIDE CRYS ER 20 MEQ PO TBCR
40.0000 meq | EXTENDED_RELEASE_TABLET | Freq: Once | ORAL | Status: AC
Start: 2015-08-26 — End: 2015-08-26
  Administered 2015-08-26: 40 meq via ORAL
  Filled 2015-08-26: qty 2

## 2015-08-26 MED ORDER — NITROGLYCERIN 2 % TD OINT
1.0000 [in_us] | TOPICAL_OINTMENT | Freq: Four times a day (QID) | TRANSDERMAL | Status: DC
  Administered 2015-08-26 – 2015-08-28 (×5): 1 [in_us] via TOPICAL
  Filled 2015-08-26: qty 30
  Filled 2015-08-26: qty 1

## 2015-08-26 NOTE — ED Notes (Signed)
Dr. Allyson Sabal aware of pt's Troponin level of .04
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
## 2015-08-26 NOTE — ED Notes (Signed)
Ordered heart healthy tray for patient.

## 2015-08-26 NOTE — ED Notes (Signed)
Admitting notified patient having continued Chest pain- soreness 5/10 without associated symptoms- requesting order for stepdown bed and medications. Dr. Allyson Sabal notified states will place order for stepdown bed and nitroglycerin and morphine PRN

## 2015-08-26 NOTE — Progress Notes (Signed)
VASCULAR LAB PRELIMINARY  PRELIMINARY  PRELIMINARY  PRELIMINARY  Bilateral lower extremity venous duplex completed.    Bilateral:  No evidence of DVT, superficial thrombosis, or Baker's Cyst.    Janifer Adie, RVT, RDMS 08/26/2015, 12:07 PM

## 2015-08-26 NOTE — ED Notes (Signed)
Patient was given  A cup of sprite soda.
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
## 2015-08-27 ENCOUNTER — Inpatient Hospital Stay (HOSPITAL_COMMUNITY): Payer: Medicare Other

## 2015-08-27 ENCOUNTER — Encounter (HOSPITAL_COMMUNITY): Payer: Self-pay | Admitting: Physician Assistant

## 2015-08-27 DIAGNOSIS — R072 Precordial pain: Secondary | ICD-10-CM

## 2015-08-27 DIAGNOSIS — I10 Essential (primary) hypertension: Secondary | ICD-10-CM

## 2015-08-27 DIAGNOSIS — I482 Chronic atrial fibrillation: Secondary | ICD-10-CM | POA: Diagnosis not present

## 2015-08-27 DIAGNOSIS — R079 Chest pain, unspecified: Secondary | ICD-10-CM | POA: Diagnosis not present

## 2015-08-27 DIAGNOSIS — Z9119 Patient's noncompliance with other medical treatment and regimen: Secondary | ICD-10-CM

## 2015-08-27 LAB — COMPREHENSIVE METABOLIC PANEL
ALK PHOS: 63 U/L (ref 38–126)
ALT: 10 U/L — AB (ref 17–63)
AST: 16 U/L (ref 15–41)
Albumin: 3 g/dL — ABNORMAL LOW (ref 3.5–5.0)
Anion gap: 10 (ref 5–15)
BILIRUBIN TOTAL: 0.7 mg/dL (ref 0.3–1.2)
BUN: 13 mg/dL (ref 6–20)
CALCIUM: 8.9 mg/dL (ref 8.9–10.3)
CO2: 26 mmol/L (ref 22–32)
CREATININE: 0.84 mg/dL (ref 0.61–1.24)
Chloride: 105 mmol/L (ref 101–111)
GFR calc non Af Amer: >60 mL/min (ref >60)
Glucose, Bld: 95 mg/dL (ref 65–99)
Potassium: 3.8 mmol/L (ref 3.5–5.1)
SODIUM: 141 mmol/L (ref 135–145)
Total Protein: 5.8 g/dL — ABNORMAL LOW (ref 6.5–8.1)

## 2015-08-27 LAB — CBC
HEMATOCRIT: 40.5 % (ref 39.0–52.0)
Hemoglobin: 13 g/dL (ref 13.0–17.0)
MCH: 28.8 pg (ref 26.0–34.0)
MCHC: 32.1 g/dL (ref 30.0–36.0)
MCV: 89.6 fL (ref 78.0–100.0)
Platelets: 182 10*3/uL (ref 150–400)
RBC: 4.52 MIL/uL (ref 4.22–5.81)
RDW: 13.5 % (ref 11.5–15.5)
WBC: 7.3 10*3/uL (ref 4.0–10.5)

## 2015-08-27 LAB — NM MYOCAR MULTI W/SPECT W/WALL MOTION / EF
CHL CUP MPHR: 163 {beats}/min
CSEPHR: 53 %
CSEPPHR: 88 {beats}/min
Estimated workload: 1 METS
Exercise duration (min): 5 min
Exercise duration (sec): 1 s
Rest HR: 57 {beats}/min

## 2015-08-27 MED ORDER — GI COCKTAIL ~~LOC~~
30.0000 mL | Freq: Three times a day (TID) | ORAL | Status: DC | PRN
  Administered 2015-08-27: 30 mL via ORAL
  Filled 2015-08-27: qty 30

## 2015-08-27 MED ORDER — TECHNETIUM TC 99M SESTAMIBI - CARDIOLITE
30.0000 | Freq: Once | INTRAVENOUS | Status: AC | PRN
  Administered 2015-08-27: 30 via INTRAVENOUS

## 2015-08-27 MED ORDER — REGADENOSON 0.4 MG/5ML IV SOLN
0.4000 mg | Freq: Once | INTRAVENOUS | Status: AC
  Administered 2015-08-27: 0.4 mg via INTRAVENOUS
  Filled 2015-08-27: qty 5

## 2015-08-27 MED ORDER — TECHNETIUM TC 99M SESTAMIBI GENERIC - CARDIOLITE
10.0000 | Freq: Once | INTRAVENOUS | Status: AC | PRN
  Administered 2015-08-27: 10 via INTRAVENOUS

## 2015-08-27 MED ORDER — REGADENOSON 0.4 MG/5ML IV SOLN
INTRAVENOUS | Status: AC
  Filled 2015-08-27: qty 5

## 2015-08-27 NOTE — Progress Notes (Signed)
Lexiscan MV performed. 1 day study, [place] to read.  Lenoard Aden 08/27/2015 1:59 PM Beeper [phone]
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
## 2015-08-27 NOTE — Progress Notes (Signed)
Notified by CCMD pt had 2.6 sec SVR.  Pt is sleeping soundly/has history of sleep apnea but does not wear CPAP.  Strip saved per CCMD and will continue to monitor.Jessie Foot, RN

## 2015-08-27 NOTE — Progress Notes (Signed)
UR Completed Avigdor Dollar Graves-Bigelow, RN,BSN [phone]  

## 2015-08-27 NOTE — Care Management Obs Status (Signed)
[facility] NOTIFICATION   Patient Details  Name: MATTIA ROCCA MRN: [place]:[id number] Date of Birth: [birth year]   Medicare Observation Status Notification Given:  Yes (chest pain)    Bethena Roys, RN 08/27/2015, 4:43 PM
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
## 2015-08-28 ENCOUNTER — Observation Stay (HOSPITAL_COMMUNITY): Payer: Medicare Other

## 2015-08-28 ENCOUNTER — Observation Stay (HOSPITAL_BASED_OUTPATIENT_CLINIC_OR_DEPARTMENT_OTHER): Payer: Medicare Other

## 2015-08-28 DIAGNOSIS — R079 Chest pain, unspecified: Secondary | ICD-10-CM

## 2015-08-28 DIAGNOSIS — I10 Essential (primary) hypertension: Secondary | ICD-10-CM | POA: Diagnosis not present

## 2015-08-28 DIAGNOSIS — I482 Chronic atrial fibrillation: Secondary | ICD-10-CM | POA: Diagnosis not present

## 2015-08-28 DIAGNOSIS — R072 Precordial pain: Secondary | ICD-10-CM | POA: Diagnosis not present

## 2015-08-28 LAB — ECHOCARDIOGRAM COMPLETE
Height: 78 in
WEIGHTICAEL: 4449.6 [oz_av]

## 2015-08-28 MED ORDER — ALBUTEROL SULFATE HFA 108 (90 BASE) MCG/ACT IN AERS: 2.0000 | INHALATION_SPRAY | RESPIRATORY_TRACT | Status: DC | PRN

## 2015-08-28 MED ORDER — PERFLUTREN LIPID MICROSPHERE
1.0000 mL | INTRAVENOUS | Status: AC | PRN
  Administered 2015-08-28: 2 mL via INTRAVENOUS
  Filled 2015-08-28: qty 10

## 2015-08-28 MED ORDER — LISINOPRIL 10 MG PO TABS: 10.0000 mg | ORAL_TABLET | Freq: Every day | ORAL | Status: DC

## 2015-08-28 MED ORDER — DABIGATRAN ETEXILATE MESYLATE 150 MG PO CAPS: 150.0000 mg | ORAL_CAPSULE | Freq: Two times a day (BID) | ORAL | Status: DC

## 2015-08-28 MED ORDER — ASPIRIN EC 81 MG PO TBEC: 81.0000 mg | DELAYED_RELEASE_TABLET | Freq: Every day | ORAL | Status: DC

## 2015-08-28 MED ORDER — TIOTROPIUM BROMIDE MONOHYDRATE 18 MCG IN CAPS: 18.0000 ug | ORAL_CAPSULE | Freq: Every day | RESPIRATORY_TRACT | Status: DC

## 2015-08-28 MED ORDER — PANTOPRAZOLE SODIUM 40 MG PO TBEC: 40.0000 mg | DELAYED_RELEASE_TABLET | Freq: Every day | ORAL | Status: DC

## 2015-08-28 MED ORDER — METOPROLOL SUCCINATE ER 25 MG PO TB24: 25.0000 mg | ORAL_TABLET | Freq: Every day | ORAL | Status: DC
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
## 2015-08-28 NOTE — Clinical Social Work Note (Signed)
CSW provided taxi voucher to patient's RN to assist with getting the patient to his truck at [employer]. CSW signing off.   Liz Beach MSW, [place], [place], [phone]

## 2015-08-28 NOTE — Clinical Social Work Note (Signed)
CSW met with patient to finalize DC plan. The patient states that he will be able to get his truck from the [place] but needs a way to get there. CSW explained that we can assist with this. The patient states that he does not know where he will go after getting his truck. CSW has reminded the patient of the [facility] information provided and encouraged him to utilize the shelters if necessary.   Liz Beach MSW, [place], [place], [phone]

## 2015-08-28 NOTE — Care Management Note (Signed)
Case Management Note  Patient Details  Name: Kyle [name] MRN: :[id number] Date of Birth: [birth year]  Subjective/Objective: Pt admitted for Chest Pain. Plan will be for d/c today. Pt with multiple social issues.  CSW assisting with disposition needs. Pt has insurance and Rx drug coverage. CM not able to assist through the [facility].                   Action/Plan: Pt with medication needs. Director of unit willing to give pt $20.00 for medication coverage. His cost ranges from $1.80-$3.00. CSW assisting with transportation, shelter resources and additional community resources. No futher needs from CM at this time.   Expected Discharge Date:                  Expected Discharge Plan:  Home/Self Care  In-House Referral:  Clinical Social Work  Discharge planning Services  CM Consult  Post Acute Care Choice:  NA Choice offered to:  NA  DME Arranged:  N/A DME Agency:  NA  HH Arranged:  NA HH Agency:  NA  Status of Service:  Completed, signed off  Medicare Important Message Given:    Date Medicare IM Given:    Medicare IM give by:    Date Additional Medicare IM Given:    Additional Medicare Important Message give by:     If discussed at [place] of Stay Meetings, dates discussed:    Additional Comments:  Bethena Roys, RN 08/28/2015, 11:46 AM

## 2015-08-28 NOTE — Progress Notes (Signed)
  Echocardiogram 2D Echocardiogram with Definity  has been performed.  Darlina Sicilian M 08/28/2015, 8:42 AM

## 2015-08-28 NOTE — Clinical Social Work Note (Signed)
Patient still here. CSW received call from RN stating that the patient has stated that the [facility] is closed. CSW contact [facility] and the facility states they are open and that the patient can come to their emergency shelter. CSW provided RN with number to give to patient in case the patient was calling the wrong number. CSW signing off at this time.   Liz Beach MSW, [place], [place], [phone]

## 2015-08-30 ENCOUNTER — Ambulatory Visit (INDEPENDENT_AMBULATORY_CARE_PROVIDER_SITE_OTHER): Payer: Medicare Other | Admitting: Internal Medicine

## 2015-08-30 ENCOUNTER — Encounter: Payer: Self-pay | Admitting: Internal Medicine

## 2015-08-30 VITALS — BP 130/82 | HR 76 | Temp 97.6°F | Resp 20 | Ht 72.0 in | Wt 280.6 lb

## 2015-08-30 DIAGNOSIS — I482 Chronic atrial fibrillation, unspecified: Secondary | ICD-10-CM

## 2015-08-30 DIAGNOSIS — K219 Gastro-esophageal reflux disease without esophagitis: Secondary | ICD-10-CM

## 2015-08-30 DIAGNOSIS — G8929 Other chronic pain: Secondary | ICD-10-CM | POA: Diagnosis not present

## 2015-08-30 DIAGNOSIS — G4700 Insomnia, unspecified: Secondary | ICD-10-CM

## 2015-08-30 DIAGNOSIS — H18891 Other specified disorders of cornea, right eye: Secondary | ICD-10-CM | POA: Diagnosis not present

## 2015-08-30 DIAGNOSIS — H18061 Stromal corneal pigmentations, right eye: Secondary | ICD-10-CM

## 2015-08-30 DIAGNOSIS — J438 Other emphysema: Secondary | ICD-10-CM

## 2015-08-30 DIAGNOSIS — I10 Essential (primary) hypertension: Secondary | ICD-10-CM | POA: Diagnosis not present

## 2015-08-30 DIAGNOSIS — S0240EA Zygomatic fracture, right side, initial encounter for closed fracture: Secondary | ICD-10-CM

## 2015-08-30 DIAGNOSIS — M549 Dorsalgia, unspecified: Secondary | ICD-10-CM | POA: Diagnosis not present

## 2015-08-30 MED ORDER — OXYCODONE HCL 30 MG PO TABS: 30.0000 mg | ORAL_TABLET | ORAL | Status: DC | PRN

## 2015-08-30 MED ORDER — TIOTROPIUM BROMIDE MONOHYDRATE 18 MCG IN CAPS: 18.0000 ug | ORAL_CAPSULE | Freq: Every day | RESPIRATORY_TRACT | Status: DC

## 2015-08-30 MED ORDER — PANTOPRAZOLE SODIUM 40 MG PO TBEC: 40.0000 mg | DELAYED_RELEASE_TABLET | Freq: Every day | ORAL | Status: DC

## 2015-08-30 MED ORDER — DABIGATRAN ETEXILATE MESYLATE 150 MG PO CAPS: 150.0000 mg | ORAL_CAPSULE | Freq: Two times a day (BID) | ORAL | Status: DC

## 2015-08-30 MED ORDER — ALBUTEROL SULFATE HFA 108 (90 BASE) MCG/ACT IN AERS: 2.0000 | INHALATION_SPRAY | RESPIRATORY_TRACT | Status: DC | PRN

## 2015-08-30 MED ORDER — METOPROLOL SUCCINATE ER 25 MG PO TB24: 25.0000 mg | ORAL_TABLET | Freq: Every day | ORAL | Status: DC

## 2015-08-30 MED ORDER — LISINOPRIL 10 MG PO TABS: 10.0000 mg | ORAL_TABLET | Freq: Every day | ORAL | Status: DC
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
## 2015-08-30 NOTE — Patient Instructions (Signed)
Continue current medications as ordered  Follow up with ENT as scheduled next week  Follow up in 2 mos for facial fracture, chronic pain

## 2015-08-31 NOTE — Progress Notes (Signed)
Addendum to include G-codes.    Collie Siad PT, DPT  Pager: [phone] Phone: [phone]     09-24-2015 1125  PT G-Codes **NOT FOR INPATIENT CLASS**  Functional Assessment Tool Used Clinical Judgement  Functional Limitation Mobility: Walking and moving around  Mobility: Walking and Moving Around Current Status [phone]) CJ  Mobility: Walking and Moving Around Goal Status [phone]) CI

## 2015-08-31 NOTE — Progress Notes (Signed)
Spoke with patient concerning prescriptions left here at the hospital and offered to call into pharmacy of his choice.  Paient states he saw his primary care physician yesterday who refilled his prescriptions. Prescriptions placed in shredder.   Sanda Linger

## 2015-09-03 ENCOUNTER — Telehealth: Payer: Self-pay | Admitting: *Deleted

## 2015-09-03 NOTE — Telephone Encounter (Signed)
Unknown Caller (would not give name) called and stated that patient is selling his Narcotic Rx's. Unknown Caller (male) stated that she can't even get Narcotic Rx's and she needs them due to Shingles and Back Pain. She stated people like Avelino Lanoux causes her not to get them and she has to buy them from him. She named off the Rx's patient received and Quantity--"Roxi # 180 and Percocet 10mg  #120". Male stated that patient goes back and forth from here to [place] getting the 2 Rx's. Has a handicap sister he stays with in [place] when he gets the Rx from there. She stated that the Dr. He gets them from in [place] is Dr. Annamaria Boots in [place]. She also stated that the patient last four of social security # is: 69. I thanked her for the information and she hung up.   I called Dr. Darien Ramus, [id number] [place], [place], [phone] Fax#:[phone] and spoke with [place]. She confirmed that they did indeed give patient Percocet #120 on 08/14/2015, and we gave him a Rx for Oxycodone on 08/30/15. They have been giving him medications since 04/02/2015, Percocet and Valium. I asked her to fax me record of this and she stated that she would fax me documentation. Informed Caren Griffins.   Received fax from Dr. Fritz Pickerel office in [place]: 04/02/15- Oxycodone APAP 10/325 #75 04/02/15- Diazepam 10mg  #30 04/10/15- Morphine Sulfate IR 30mg  # 30 04/22/15- Oxycodone APAP 10/325 # 60 05/02/15- Diazepam 10mg  #30 05/06/15- Oxycodone APAP 10/325 #60 05/08/15- Morphine Sulfate IR 30mg  #30 05/20/15- Oxycodone APAP 10/325 #90 05/30/15- Fentanyl 10mcg #10 patches 05/31/15- Oxycodone APAP 10/325 #120 07/02/15- Oxycodone APAP 10/325 #90 07/02/15- Diazepam 10mg  #30 07/22/15- Oxycodone APAP 10/325 #40 08/14/15- Oxycodone APAP 10/325 #120 08/14/15-Diazepam 10mg  #30

## 2015-09-03 NOTE — Telephone Encounter (Signed)
Sending message to [place].

## 2015-09-03 NOTE — Telephone Encounter (Signed)
Noted.  [place] controlled substance query completed and there are several Rx  that he rec'd from this office that are not showing up on the report. He is getting narcotics from another provider in another state. He is in violation of his pain contract. Please send termination letter. Thank you

## 2015-09-04 DIAGNOSIS — S02402A Zygomatic fracture, unspecified, initial encounter for closed fracture: Secondary | ICD-10-CM | POA: Diagnosis not present

## 2015-09-04 DIAGNOSIS — S02400A Malar fracture unspecified, initial encounter for closed fracture: Secondary | ICD-10-CM | POA: Diagnosis not present

## 2015-09-04 NOTE — Telephone Encounter (Signed)
Called Kyle [name] regarding Pain Management Contract.  Left message, requesting call back...cdavis

## 2015-09-05 NOTE — Telephone Encounter (Signed)
Mr. Guzzetta returned Cynthia's call. She was out of the office so I transferred him to voicemail. Documenting incase he did not leave a message

## 2015-09-06 ENCOUNTER — Telehealth: Payer: Self-pay | Admitting: *Deleted

## 2015-09-06 ENCOUNTER — Telehealth: Payer: Self-pay | Admitting: Internal Medicine

## 2015-09-06 NOTE — Telephone Encounter (Signed)
Noted. Ok to refer to pain clinic

## 2015-09-06 NOTE — Telephone Encounter (Signed)
Patient's Medical Records have been Printed for Discharge and left upfront for pick up. Patient has enough Maintenance medications to last him until he is able to find another provider. All medications were faxed to his pharmacy on 08/30/2015 with 5 refills.

## 2015-09-06 NOTE — Telephone Encounter (Signed)
Mr. Teo returned my call.  Talked with him about his  Pain Contract.  He broke the contract by receiving Pain medication from a Primary Care Provider in  [place].  He has been getting this medication regularly for some time.   Mr. Giffen is also receiving Pain Medication from his Primary Care Provider here at [facility]. ( see Rodena Piety May notes).  Informed Mr. Luisi that we are concerned about his health, this is why the contract is so Important. Patients should not be prescribed pain medication from other providers without his primary care provider being made aware.  Also, informed Mr. Tanaka he will be discharged from our [facility] effective within 30 days from The day he receives his letter of notification.  We will make sure he has his maintenance medication only,  for the next 30 days,  This will allow him time to switch his medical care to another Primary Care Provider.   Mr.Atkins asked for referral to a Pain Clinic....told him I would be back in contact with him after discussing with Dr. Eulas Post.  cdavis

## 2015-09-10 ENCOUNTER — Telehealth: Payer: Self-pay | Admitting: Internal Medicine

## 2015-09-10 ENCOUNTER — Encounter: Payer: Self-pay | Admitting: Internal Medicine

## 2015-09-10 ENCOUNTER — Telehealth: Payer: Self-pay | Admitting: *Deleted

## 2015-09-10 DIAGNOSIS — G8929 Other chronic pain: Secondary | ICD-10-CM

## 2015-09-10 DIAGNOSIS — M549 Dorsalgia, unspecified: Principal | ICD-10-CM

## 2015-09-10 NOTE — Telephone Encounter (Signed)
Patient called Kyle [name] and requested a Pain Clinic Referral. Referral placed for scheduling.

## 2015-09-10 NOTE — Telephone Encounter (Signed)
Called Kyle [name], discussed the Pain Clinic referral per Dr. Eulas Post approval...Marland KitchenMarland KitchenInformed him that his Medical Records would be forwarded to the Pain Clinic, they will be in contact with him with his appointment information.   Also, our Referral Coordinated  would be in contact with him regarding the name of the facility he will be referred to.... Patient says his address has changed to  [address] [place].Marland KitchenTo call me back with zip code.....cdavis

## 2015-09-10 NOTE — Telephone Encounter (Signed)
A gentleman caller (did not wish to give his name) called requesting to speak with someone. He stated he was calling to report a patient who has been selling his narcotic prescriptions and he sold them to his little brother and nothing was being done about it. He said he wanted to call us before he contacted someone else. Gave phone call to Allen Norris practice administrator who then spoke to the gentleman

## 2015-09-10 NOTE — Telephone Encounter (Signed)
Called patient and informed him that we sent his referral over to [place], patient verbalized understanding.

## 2015-09-11 ENCOUNTER — Encounter: Payer: Self-pay | Admitting: Internal Medicine

## 2015-09-11 NOTE — Progress Notes (Signed)
OT note/Addendum     September 24, 2015 1230  OT Visit Information  Last OT Received On 09/24/2015  OT G-codes **NOT FOR INPATIENT CLASS**  Functional Assessment Tool Used clinical judgement  Functional Limitation Self care  Self Care Current Status [phone]) CK  Self Care Goal Status [id number]) CI   Chrys Racer, MS., OTR/L Pager: [phone]

## 2015-09-11 NOTE — Telephone Encounter (Signed)
The letter printed on September 10, 2015 was not mailed.... Mail letter dated September 11, 2015--with corrections by Dr. Eulas Post.... Dr. Eulas Post has been made aware of this call.cdavis

## 2015-09-18 ENCOUNTER — Telehealth: Payer: Self-pay | Admitting: Internal Medicine

## 2015-09-18 ENCOUNTER — Encounter: Payer: Self-pay | Admitting: Internal Medicine

## 2015-09-18 NOTE — Telephone Encounter (Signed)
Mr. Nahas has been made aware of the Pain Clinic referral at [facility].  They will be in contact with him. cdavis

## 2015-09-18 NOTE — Telephone Encounter (Signed)
Dismissal letter sent certified was returned. The letter was mailed to an address the patient says he had recently moved to [place], [place] [address] Re mailing the letter to [address] [place] [address]- another address where he was listed as living. Cdavis

## 2015-09-20 NOTE — Telephone Encounter (Signed)
To [employer]

## 2015-09-20 NOTE — Telephone Encounter (Signed)
Pt calling asking to speak with Dr. Eulas Post personally, per pt he's had some trauma to the right side of his face and wanted her recommendation on where he should go for care. Per pt he would like Dr. Eulas Post to call him personally, I advised to pt that Dr. Eulas Post is in clinic seeing patient's and that might be difficult, I tried again to help patient but he only wanted to speak with Dr. Eulas Post.   (I did not tell pt he had been dismissed)

## 2015-09-23 NOTE — Telephone Encounter (Signed)
Patient called back today and left message wanting to speak with Dr. Eulas Post personally regarding his Right eye. I tried calling patient back to see what was going on with his eye and had to [facility] to return call.
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
## 2015-09-23 NOTE — Telephone Encounter (Signed)
He needs to f/u with either trauma sx or ENT regarding his facial fx. He was told prior to d/c who to f/u with. i will not be writing any more pain medication. He needs to find another PCP.

## 2015-09-26 ENCOUNTER — Telehealth: Payer: Self-pay | Admitting: Internal Medicine

## 2015-09-26 NOTE — Telephone Encounter (Signed)
Returned Kyle [name] call.  Stated he needs his medication.  Reminded Kyle [name] he was dismissed from our practice and he would not get anymore pain medication.  Informed him that his certified dismissal letter was returned because of  Unknown address.  Resended the letters.  Pt stated he would be contacting an Forensic psychologist.  Informed Risk Management.cdavis

## 2015-09-26 NOTE — Telephone Encounter (Signed)
Caren Griffins will be calling patient.

## 2015-09-26 NOTE — Telephone Encounter (Signed)
He will not receive any additional pain meds from this office. He needs to f/u with new provider as scheduled

## 2015-09-26 NOTE — Telephone Encounter (Addendum)
Patient stated that he does not need 30 mg that 15mg  would be ok. He wants to see if you would reduce it to 15mg . He states he wants to get over getting taking all of this pain medications.  Patient states he has an appointment on the 10/11/15 with Dr. Alvester Chou [phone] at 10:00 am but patient states he is going to need pain medication till then. Patient states he cannot do without it due to the pain. Patient stated that he is going to call the ENT himself for an appointment.  Please Advise.

## 2015-09-30 DIAGNOSIS — J449 Chronic obstructive pulmonary disease, unspecified: Secondary | ICD-10-CM | POA: Diagnosis not present

## 2015-09-30 DIAGNOSIS — I10 Essential (primary) hypertension: Secondary | ICD-10-CM | POA: Diagnosis not present

## 2015-09-30 DIAGNOSIS — K219 Gastro-esophageal reflux disease without esophagitis: Secondary | ICD-10-CM | POA: Diagnosis not present

## 2015-09-30 DIAGNOSIS — Z79899 Other long term (current) drug therapy: Secondary | ICD-10-CM | POA: Diagnosis not present

## 2015-09-30 DIAGNOSIS — I4891 Unspecified atrial fibrillation: Secondary | ICD-10-CM | POA: Diagnosis not present

## 2015-10-04 ENCOUNTER — Telehealth: Payer: Self-pay

## 2015-10-04 DIAGNOSIS — R51 Headache: Secondary | ICD-10-CM | POA: Diagnosis not present

## 2015-10-04 DIAGNOSIS — S02412A LeFort II fracture, initial encounter for closed fracture: Secondary | ICD-10-CM | POA: Diagnosis not present

## 2015-10-04 NOTE — Telephone Encounter (Signed)
Patient called requesting Medical Records to be sent to [facility]. Patient has pending appointment on Monday @ 9:30 am. Per Roland Earl in medical records,  records were mailed on 10/02/15. Roland Earl had a conversation with Melody at [facility] and she said the fax machine was out of order and gave Roland Earl mailing address to send records. Patient verbalized understanding.   Patient was pleasant and thanked [place] for our service to him

## 2015-10-07 DIAGNOSIS — Z1389 Encounter for screening for other disorder: Secondary | ICD-10-CM | POA: Diagnosis not present

## 2015-10-07 DIAGNOSIS — S0292XA Unspecified fracture of facial bones, initial encounter for closed fracture: Secondary | ICD-10-CM | POA: Diagnosis not present

## 2015-10-07 DIAGNOSIS — M79661 Pain in right lower leg: Secondary | ICD-10-CM | POA: Diagnosis not present

## 2015-10-07 DIAGNOSIS — R52 Pain, unspecified: Secondary | ICD-10-CM | POA: Diagnosis not present

## 2015-10-07 DIAGNOSIS — G8929 Other chronic pain: Secondary | ICD-10-CM | POA: Diagnosis not present

## 2015-10-08 ENCOUNTER — Telehealth: Payer: Self-pay | Admitting: Internal Medicine

## 2015-10-08 DIAGNOSIS — R609 Edema, unspecified: Secondary | ICD-10-CM | POA: Diagnosis not present

## 2015-10-08 NOTE — Telephone Encounter (Signed)
Both dismissal letters  mailed  to different address were returned as undeliverable.  0/25/17, I Mailed a dismissal letter to [address] [place], [place] [address]--this address was on patients drivers licenses. Mr Bisbee confirmed both of the address where the certified letter were mailed to were both confirmed as being correct by Mr. Kilkenny.   Wrong address were [address] [place] [address], [place], [place] [address] And [address]

## 2015-10-08 NOTE — Telephone Encounter (Signed)
Continued....the incorrect addresses were : [address] [place] , [place] [address] and [address] [place] [address]

## 2015-10-10 DIAGNOSIS — G8929 Other chronic pain: Secondary | ICD-10-CM | POA: Diagnosis not present

## 2015-10-10 DIAGNOSIS — L97919 Non-pressure chronic ulcer of unspecified part of right lower leg with unspecified severity: Secondary | ICD-10-CM | POA: Diagnosis not present

## 2015-10-10 DIAGNOSIS — F17200 Nicotine dependence, unspecified, uncomplicated: Secondary | ICD-10-CM | POA: Diagnosis not present

## 2015-10-10 DIAGNOSIS — I4891 Unspecified atrial fibrillation: Secondary | ICD-10-CM | POA: Diagnosis not present

## 2015-10-11 DIAGNOSIS — E669 Obesity, unspecified: Secondary | ICD-10-CM | POA: Diagnosis not present

## 2015-10-11 DIAGNOSIS — E559 Vitamin D deficiency, unspecified: Secondary | ICD-10-CM | POA: Diagnosis not present

## 2015-10-11 DIAGNOSIS — S81809A Unspecified open wound, unspecified lower leg, initial encounter: Secondary | ICD-10-CM | POA: Diagnosis not present

## 2015-10-11 DIAGNOSIS — Z6841 Body Mass Index (BMI) 40.0 and over, adult: Secondary | ICD-10-CM | POA: Diagnosis not present

## 2015-10-11 DIAGNOSIS — R52 Pain, unspecified: Secondary | ICD-10-CM | POA: Diagnosis not present

## 2015-10-11 DIAGNOSIS — D511 Vitamin B12 deficiency anemia due to selective vitamin B12 malabsorption with proteinuria: Secondary | ICD-10-CM | POA: Diagnosis not present

## 2015-10-11 DIAGNOSIS — I10 Essential (primary) hypertension: Secondary | ICD-10-CM | POA: Diagnosis not present

## 2015-10-11 DIAGNOSIS — Z1329 Encounter for screening for other suspected endocrine disorder: Secondary | ICD-10-CM | POA: Diagnosis not present

## 2015-10-11 DIAGNOSIS — F331 Major depressive disorder, recurrent, moderate: Secondary | ICD-10-CM | POA: Diagnosis not present

## 2015-10-14 DIAGNOSIS — L97211 Non-pressure chronic ulcer of right calf limited to breakdown of skin: Secondary | ICD-10-CM | POA: Diagnosis not present

## 2015-10-14 DIAGNOSIS — R600 Localized edema: Secondary | ICD-10-CM | POA: Diagnosis not present

## 2015-10-14 DIAGNOSIS — I87311 Chronic venous hypertension (idiopathic) with ulcer of right lower extremity: Secondary | ICD-10-CM | POA: Diagnosis not present

## 2015-10-28 DIAGNOSIS — G8929 Other chronic pain: Secondary | ICD-10-CM | POA: Diagnosis not present

## 2015-10-28 DIAGNOSIS — L97911 Non-pressure chronic ulcer of unspecified part of right lower leg limited to breakdown of skin: Secondary | ICD-10-CM | POA: Diagnosis not present

## 2015-10-28 DIAGNOSIS — Z1389 Encounter for screening for other disorder: Secondary | ICD-10-CM | POA: Diagnosis not present

## 2015-10-28 DIAGNOSIS — F419 Anxiety disorder, unspecified: Secondary | ICD-10-CM | POA: Diagnosis not present

## 2015-10-28 DIAGNOSIS — M549 Dorsalgia, unspecified: Secondary | ICD-10-CM | POA: Diagnosis not present

## 2015-11-01 ENCOUNTER — Ambulatory Visit: Payer: Self-pay | Admitting: Internal Medicine

## 2015-11-08 DIAGNOSIS — S81809A Unspecified open wound, unspecified lower leg, initial encounter: Secondary | ICD-10-CM | POA: Diagnosis not present

## 2015-11-08 DIAGNOSIS — I10 Essential (primary) hypertension: Secondary | ICD-10-CM | POA: Diagnosis not present

## 2015-11-08 DIAGNOSIS — R52 Pain, unspecified: Secondary | ICD-10-CM | POA: Diagnosis not present

## 2015-11-08 DIAGNOSIS — F331 Major depressive disorder, recurrent, moderate: Secondary | ICD-10-CM | POA: Diagnosis not present

## 2015-12-13 DIAGNOSIS — S81801D Unspecified open wound, right lower leg, subsequent encounter: Secondary | ICD-10-CM | POA: Diagnosis not present

## 2015-12-13 DIAGNOSIS — S81809A Unspecified open wound, unspecified lower leg, initial encounter: Secondary | ICD-10-CM | POA: Diagnosis not present

## 2015-12-13 DIAGNOSIS — I10 Essential (primary) hypertension: Secondary | ICD-10-CM | POA: Diagnosis not present

## 2015-12-13 DIAGNOSIS — Z6841 Body Mass Index (BMI) 40.0 and over, adult: Secondary | ICD-10-CM | POA: Diagnosis not present

## 2015-12-13 DIAGNOSIS — F331 Major depressive disorder, recurrent, moderate: Secondary | ICD-10-CM | POA: Diagnosis not present

## 2015-12-13 DIAGNOSIS — R52 Pain, unspecified: Secondary | ICD-10-CM | POA: Diagnosis not present

## 2016-01-07 DIAGNOSIS — S81801D Unspecified open wound, right lower leg, subsequent encounter: Secondary | ICD-10-CM | POA: Diagnosis not present

## 2016-01-07 DIAGNOSIS — E669 Obesity, unspecified: Secondary | ICD-10-CM | POA: Diagnosis not present

## 2016-01-07 DIAGNOSIS — Z0289 Encounter for other administrative examinations: Secondary | ICD-10-CM | POA: Diagnosis not present

## 2016-01-07 DIAGNOSIS — R52 Pain, unspecified: Secondary | ICD-10-CM | POA: Diagnosis not present

## 2016-01-07 DIAGNOSIS — I10 Essential (primary) hypertension: Secondary | ICD-10-CM | POA: Diagnosis not present

## 2016-01-07 DIAGNOSIS — F331 Major depressive disorder, recurrent, moderate: Secondary | ICD-10-CM | POA: Diagnosis not present

## 2016-01-07 DIAGNOSIS — Z6841 Body Mass Index (BMI) 40.0 and over, adult: Secondary | ICD-10-CM | POA: Diagnosis not present

## 2016-02-06 DIAGNOSIS — S81801D Unspecified open wound, right lower leg, subsequent encounter: Secondary | ICD-10-CM | POA: Diagnosis not present

## 2016-02-06 DIAGNOSIS — I10 Essential (primary) hypertension: Secondary | ICD-10-CM | POA: Diagnosis not present

## 2016-02-06 DIAGNOSIS — E669 Obesity, unspecified: Secondary | ICD-10-CM | POA: Diagnosis not present

## 2016-02-06 DIAGNOSIS — R52 Pain, unspecified: Secondary | ICD-10-CM | POA: Diagnosis not present

## 2016-02-26 DIAGNOSIS — G8929 Other chronic pain: Secondary | ICD-10-CM | POA: Diagnosis not present

## 2016-02-26 DIAGNOSIS — M549 Dorsalgia, unspecified: Secondary | ICD-10-CM | POA: Diagnosis not present

## 2016-02-26 DIAGNOSIS — I4891 Unspecified atrial fibrillation: Secondary | ICD-10-CM | POA: Diagnosis not present

## 2016-02-26 DIAGNOSIS — Z1389 Encounter for screening for other disorder: Secondary | ICD-10-CM | POA: Diagnosis not present

## 2016-02-26 DIAGNOSIS — Z23 Encounter for immunization: Secondary | ICD-10-CM | POA: Diagnosis not present

## 2016-03-05 DIAGNOSIS — I10 Essential (primary) hypertension: Secondary | ICD-10-CM | POA: Diagnosis not present

## 2016-03-05 DIAGNOSIS — E669 Obesity, unspecified: Secondary | ICD-10-CM | POA: Diagnosis not present

## 2016-03-05 DIAGNOSIS — S81801D Unspecified open wound, right lower leg, subsequent encounter: Secondary | ICD-10-CM | POA: Diagnosis not present

## 2016-03-05 DIAGNOSIS — R52 Pain, unspecified: Secondary | ICD-10-CM | POA: Diagnosis not present

## 2016-03-20 DIAGNOSIS — M25551 Pain in right hip: Secondary | ICD-10-CM | POA: Diagnosis not present

## 2016-03-20 DIAGNOSIS — M5441 Lumbago with sciatica, right side: Secondary | ICD-10-CM | POA: Diagnosis not present

## 2016-03-20 DIAGNOSIS — M5442 Lumbago with sciatica, left side: Secondary | ICD-10-CM | POA: Diagnosis not present

## 2016-03-23 DIAGNOSIS — I10 Essential (primary) hypertension: Secondary | ICD-10-CM | POA: Diagnosis not present

## 2016-03-23 DIAGNOSIS — G8929 Other chronic pain: Secondary | ICD-10-CM | POA: Diagnosis not present

## 2016-03-23 DIAGNOSIS — J449 Chronic obstructive pulmonary disease, unspecified: Secondary | ICD-10-CM | POA: Diagnosis not present

## 2016-03-23 DIAGNOSIS — M549 Dorsalgia, unspecified: Secondary | ICD-10-CM | POA: Diagnosis not present

## 2016-03-23 DIAGNOSIS — K219 Gastro-esophageal reflux disease without esophagitis: Secondary | ICD-10-CM | POA: Diagnosis not present

## 2016-04-02 DIAGNOSIS — E669 Obesity, unspecified: Secondary | ICD-10-CM | POA: Diagnosis not present

## 2016-04-02 DIAGNOSIS — I10 Essential (primary) hypertension: Secondary | ICD-10-CM | POA: Diagnosis not present

## 2016-04-02 DIAGNOSIS — S81801D Unspecified open wound, right lower leg, subsequent encounter: Secondary | ICD-10-CM | POA: Diagnosis not present

## 2016-04-02 DIAGNOSIS — R52 Pain, unspecified: Secondary | ICD-10-CM | POA: Diagnosis not present

## 2016-04-09 DIAGNOSIS — R600 Localized edema: Secondary | ICD-10-CM | POA: Diagnosis not present

## 2016-04-13 DIAGNOSIS — G894 Chronic pain syndrome: Secondary | ICD-10-CM | POA: Diagnosis not present

## 2016-04-13 DIAGNOSIS — M545 Low back pain: Secondary | ICD-10-CM | POA: Diagnosis not present

## 2016-04-13 DIAGNOSIS — G8911 Acute pain due to trauma: Secondary | ICD-10-CM | POA: Diagnosis not present

## 2016-04-13 MED FILL — oxyCODONE HCL 10 MG TABS: 10 | 7 days supply | Qty: 21 | Fill #0

## 2016-04-14 DIAGNOSIS — F17200 Nicotine dependence, unspecified, uncomplicated: Secondary | ICD-10-CM | POA: Diagnosis not present

## 2016-04-14 DIAGNOSIS — M5442 Lumbago with sciatica, left side: Secondary | ICD-10-CM | POA: Diagnosis not present

## 2016-04-14 DIAGNOSIS — M25551 Pain in right hip: Secondary | ICD-10-CM | POA: Diagnosis not present

## 2016-04-14 DIAGNOSIS — M5441 Lumbago with sciatica, right side: Secondary | ICD-10-CM | POA: Diagnosis not present

## 2016-05-11 DIAGNOSIS — G8929 Other chronic pain: Secondary | ICD-10-CM | POA: Diagnosis not present

## 2016-05-11 DIAGNOSIS — R600 Localized edema: Secondary | ICD-10-CM | POA: Diagnosis not present

## 2016-05-11 DIAGNOSIS — M549 Dorsalgia, unspecified: Secondary | ICD-10-CM | POA: Diagnosis not present

## 2016-05-11 DIAGNOSIS — G4700 Insomnia, unspecified: Secondary | ICD-10-CM | POA: Diagnosis not present

## 2016-05-11 DIAGNOSIS — I10 Essential (primary) hypertension: Secondary | ICD-10-CM | POA: Diagnosis not present

## 2016-05-22 DIAGNOSIS — M25551 Pain in right hip: Secondary | ICD-10-CM | POA: Diagnosis not present

## 2016-05-22 DIAGNOSIS — G894 Chronic pain syndrome: Secondary | ICD-10-CM | POA: Diagnosis not present

## 2016-05-22 DIAGNOSIS — M545 Low back pain: Secondary | ICD-10-CM | POA: Diagnosis not present

## 2016-05-22 MED FILL — oxyCODONE HCL 15 MG TABS: 15 | 30 days supply | Qty: 120 | Fill #0

## 2016-06-03 DIAGNOSIS — M5442 Lumbago with sciatica, left side: Secondary | ICD-10-CM | POA: Diagnosis not present

## 2016-06-03 DIAGNOSIS — M5441 Lumbago with sciatica, right side: Secondary | ICD-10-CM | POA: Diagnosis not present

## 2016-06-22 MED FILL — oxyCODONE HCL 15 MG TABS: 15 | 30 days supply | Qty: 120 | Fill #0

## 2016-07-20 MED FILL — oxyCODONE HCL 20 MG TABS: 20 | 30 days supply | Qty: 120 | Fill #0

## 2016-08-24 MED FILL — oxyCODONE HCL 10 MG TABS: 10 | 30 days supply | Qty: 120 | Fill #0

## 2018-02-02 ENCOUNTER — Encounter: Payer: Self-pay | Admitting: Internal Medicine

## 2018-08-16 ENCOUNTER — Ambulatory Visit (INDEPENDENT_AMBULATORY_CARE_PROVIDER_SITE_OTHER): Payer: Self-pay | Admitting: Primary Care

## 2018-08-16 ENCOUNTER — Encounter: Payer: Self-pay | Admitting: Primary Care

## 2018-08-16 VITALS — BP 144/86 | HR 81 | Temp 98.1°F | Ht 72.0 in | Wt 367.5 lb

## 2018-08-16 DIAGNOSIS — G8929 Other chronic pain: Secondary | ICD-10-CM

## 2018-08-16 DIAGNOSIS — G4700 Insomnia, unspecified: Secondary | ICD-10-CM

## 2018-08-16 DIAGNOSIS — M5441 Lumbago with sciatica, right side: Secondary | ICD-10-CM

## 2018-08-16 DIAGNOSIS — J438 Other emphysema: Secondary | ICD-10-CM

## 2018-08-16 DIAGNOSIS — I10 Essential (primary) hypertension: Secondary | ICD-10-CM

## 2018-08-16 DIAGNOSIS — Z6841 Body Mass Index (BMI) 40.0 and over, adult: Secondary | ICD-10-CM

## 2018-08-16 DIAGNOSIS — G4733 Obstructive sleep apnea (adult) (pediatric): Secondary | ICD-10-CM

## 2018-08-16 DIAGNOSIS — E785 Hyperlipidemia, unspecified: Secondary | ICD-10-CM

## 2018-08-16 DIAGNOSIS — I482 Chronic atrial fibrillation, unspecified: Secondary | ICD-10-CM

## 2018-08-16 DIAGNOSIS — F4323 Adjustment disorder with mixed anxiety and depressed mood: Secondary | ICD-10-CM

## 2018-08-16 MED ORDER — LISINOPRIL 20 MG PO TABS: 20.0000 mg | ORAL_TABLET | Freq: Every day | ORAL | 0 refills | Status: DC

## 2018-08-16 NOTE — Assessment & Plan Note (Signed)
Chronic.  Likely from both anxiety and chronic pain. We will start with treatment for anxiety with Zoloft and go from there.  Previously on amitriptyline 50 to 100 mg at bedtime per prior PCP.

## 2018-08-16 NOTE — Assessment & Plan Note (Signed)
Out of narcotics for the last 2 to 3 weeks, discussed that I will not prescribe. We did contact his pharmacy in [place] who endorsed that he picked up Percocet 10-325 mg on August 01, 2018. Referral placed to pain management for further evaluation.

## 2018-08-16 NOTE — Assessment & Plan Note (Signed)
Diagnosed years ago, not currently on anticoagulation or beta-blocker. EKG today supportive of atrial fibrillation. ChadsVasc score of 3 today, one-point for hypertension and 2 points for TIA that he endorses he had in January 2020.  We do not have those records.  History in epic states "stroke", he was on anticoagulation in prior years. Labs including renal function are pending today, we will re-initiate anticoagulation once labs have returned tomorrow. Discussed with patient, he agrees.
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
## 2018-08-16 NOTE — Assessment & Plan Note (Signed)
Has been out of meds for 2 to 3 weeks, blood pressure above goal today. We will start with initiation of lisinopril 20 mg daily, prescription sent to pharmacy. We will also need to add in metoprolol tartrate, we will address at next office visit. He will return in 2 to 3 weeks for blood pressure check. BMP pending.

## 2018-08-16 NOTE — Patient Instructions (Signed)
Start lisinopril 20 mg tablets for blood pressure. Take 1 tablet daily.  Start sertraline 50 mg tablets for depression and anxiety. Take 1/2 tablet daily for 8 days, then increase to 1 full tablet thereafter.  We will send in additional medication for your heart once we receive your lab tests.  Schedule a follow up visit for blood pressure check for 2-3 weeks.  It was a pleasure to see you today!

## 2018-08-16 NOTE — Assessment & Plan Note (Signed)
Not managed on statin therapy, repeat lipid panel pending.

## 2018-08-16 NOTE — Assessment & Plan Note (Signed)
Compliant to albuterol, using appropriately. Discussed the need for Laba and/or ICS if frequent use of albuterol occurs. He verbalized understanding and will update.

## 2018-08-16 NOTE — Assessment & Plan Note (Signed)
Discussed the importance of a healthy diet and regular exercise in order for weight loss, and to reduce the risk of any potential medical problems.  

## 2018-08-16 NOTE — Assessment & Plan Note (Signed)
Has not used CPAP machine in years as he cannot tolerate. Endorses using wedge pillow without symptoms.

## 2018-08-16 NOTE — Assessment & Plan Note (Signed)
It seems that he overall did well on Paxil but this did cause a significant weight gain which is not ideal for him. GAD 7 score 15 and PHQ 9 score 17 today. We will start Zoloft 50 mg.  Patient is to take 1/2 tablet daily for 8 days, then advance to 1 full tablet thereafter. We discussed possible side effects of headache, GI upset, drowsiness, and SI/HI. If thoughts of SI/HI develop, we discussed to present to the emergency immediately. Patient verbalized understanding.   He will return in 2 weeks for blood pressure follow-up, we will also follow-up on Zoloft and closely monitor.

## 2018-08-17 ENCOUNTER — Other Ambulatory Visit: Payer: Self-pay | Admitting: Primary Care

## 2018-08-17 DIAGNOSIS — I482 Chronic atrial fibrillation, unspecified: Secondary | ICD-10-CM

## 2018-08-17 LAB — LIPID PANEL
CHOL/HDL RATIO: 5
CHOLESTEROL: 163 mg/dL (ref 0–200)
HDL: 33.8 mg/dL — AB (ref >39.00)
LDL CALC: 114 mg/dL — AB (ref 0–99)
NONHDL: 129.1
Triglycerides: 75 mg/dL (ref 0.0–149.0)
VLDL: 15 mg/dL (ref 0.0–40.0)

## 2018-08-17 LAB — COMPREHENSIVE METABOLIC PANEL
ALT: 12 U/L (ref 0–53)
AST: 25 U/L (ref 0–37)
Albumin: 4.1 g/dL (ref 3.5–5.2)
Alkaline Phosphatase: 73 U/L (ref 39–117)
BUN: 17 mg/dL (ref 6–23)
CO2: 29 mEq/L (ref 19–32)
Calcium: 9.8 mg/dL (ref 8.4–10.5)
Chloride: 103 mEq/L (ref 96–112)
Creatinine, Ser: 0.78 mg/dL (ref 0.40–1.50)
GFR: 101.24 mL/min (ref >60.00)
Glucose, Bld: 95 mg/dL (ref 70–99)
Potassium: 4.3 mEq/L (ref 3.5–5.1)
Sodium: 140 mEq/L (ref 135–145)
TOTAL PROTEIN: 7.7 g/dL (ref 6.0–8.3)
Total Bilirubin: 0.5 mg/dL (ref 0.2–1.2)

## 2018-08-17 LAB — CBC
HCT: 44.9 % (ref 39.0–52.0)
Hemoglobin: 14.9 g/dL (ref 13.0–17.0)
MCHC: 33.2 g/dL (ref 30.0–36.0)
MCV: 89.9 fl (ref 78.0–100.0)
Platelets: 279 10*3/uL (ref 150.0–400.0)
RBC: 5 Mil/uL (ref 4.22–5.81)
RDW: 15.8 % — AB (ref 11.5–15.5)
WBC: 8.2 10*3/uL (ref 4.0–10.5)

## 2018-08-17 MED ORDER — APIXABAN 5 MG PO TABS: 5.0000 mg | ORAL_TABLET | Freq: Two times a day (BID) | ORAL | 3 refills | Status: DC

## 2018-08-18 ENCOUNTER — Ambulatory Visit: Payer: Medicare Other | Admitting: Podiatry

## 2018-08-24 ENCOUNTER — Other Ambulatory Visit: Payer: Self-pay | Admitting: Primary Care

## 2018-08-24 ENCOUNTER — Telehealth: Payer: Self-pay | Admitting: Primary Care

## 2018-08-24 DIAGNOSIS — I482 Chronic atrial fibrillation, unspecified: Secondary | ICD-10-CM

## 2018-08-24 DIAGNOSIS — E785 Hyperlipidemia, unspecified: Secondary | ICD-10-CM

## 2018-08-24 MED ORDER — ATORVASTATIN CALCIUM 20 MG PO TABS: 20.0000 mg | ORAL_TABLET | Freq: Every day | ORAL | 3 refills | Status: DC

## 2018-08-24 MED ORDER — RIVAROXABAN 20 MG PO TABS: 20.0000 mg | ORAL_TABLET | Freq: Every day | ORAL | 0 refills | Status: DC

## 2018-08-24 NOTE — Telephone Encounter (Signed)
Patient was seen by Anda Kraft on 08/14/18. Patient said he was suppose to be referred to Pain Management. Please call patient before making referral.

## 2018-08-25 NOTE — Telephone Encounter (Signed)
Called the patient and he received a call from Kyle [name] trying to schedule him. He called today and computers were down so he will call them tomorrow to set up his appt.

## 2018-08-26 ENCOUNTER — Telehealth: Payer: Self-pay | Admitting: Primary Care

## 2018-08-26 NOTE — Telephone Encounter (Signed)
Patient has been out of pain medication for over one month now.  As discussed during his visit we will not be providing narcotics.  Did he every pick up Xarelto which is taken for stroke prevention from atrial fibrillation? He mentioned that the Eliquis was too costly.

## 2018-08-26 NOTE — Telephone Encounter (Signed)
Noted  

## 2018-08-26 NOTE — Telephone Encounter (Signed)
Pt called and set up appt with [facility] pain clinic. The earliest appt available was 4/21 at 8am. Pt said if he waits that long he will be dead- he need medication now, he feels like his body is shutting down. He is requesting percoset for pain. I confirmed 3/17 appt at 9am.

## 2018-08-26 NOTE — Telephone Encounter (Signed)
Spoken and notified patient of Kyle Millers comments. Patient verbalized understanding.  Patient stated that he has to find a ride to go to the pharmacy but he will be it up soon or let us know soon.

## 2018-08-30 ENCOUNTER — Ambulatory Visit: Payer: Self-pay | Admitting: Primary Care

## 2018-09-05 ENCOUNTER — Telehealth: Payer: Self-pay

## 2018-09-05 DIAGNOSIS — M5441 Lumbago with sciatica, right side: Principal | ICD-10-CM

## 2018-09-05 DIAGNOSIS — G8929 Other chronic pain: Secondary | ICD-10-CM

## 2018-09-05 NOTE — Telephone Encounter (Signed)
Spoke with patient in regards to refill request we received from [facility] for Amitriptyline and Gabapentin. These medications were taken out of his current med list. Patient states he is still taking these both daily and last time this was prescribed by previous PCP. He ran out of medications on 08/30/2018. Patient takes Amitriptyline 50 mg 2 at bedtime (can that be changed to 100 mg 1 at bedtime?) and Gabapentin 600 mg 1 tablet 4 times daily. Patient also states he has an appointment with pain management but can not see him until April. Please review  Also he is not on Xarelto yet. He signed up for [facility] mail order services and has to wait till April 1st to contact them again to try and fill this, depending on the price. He filled out application and all the requirements and has to wait on additional information from mail order. He is taking Aspirin 81 mg 1 daily for now. He will let us know if there is an issue with filling Xarelto.

## 2018-09-05 NOTE — Telephone Encounter (Signed)
Please notify patient that I will send a temporary supply of his gabapentin and amitriptyline to the pharmacy, he will need to have his pain management doctor evaluate the need for these medications moving forward.   Since he's been out of his medication we will need to resume at a lower dose.  600 mg of gabapentin QID is quite high of a dose but I can do 600 mg TID.  Will resume amitriptyline at 50 mg HS, he will need to start with 1/2 tablet daily for 3-4 days, then increase to 50 mg dose. Let me know if he's agreeable.

## 2018-09-06 MED ORDER — AMITRIPTYLINE HCL 50 MG PO TABS: 50.0000 mg | ORAL_TABLET | Freq: Every day | ORAL | 0 refills | Status: DC

## 2018-09-06 MED ORDER — GABAPENTIN 600 MG PO TABS: 600.0000 mg | ORAL_TABLET | Freq: Three times a day (TID) | ORAL | 0 refills | Status: DC

## 2018-09-06 NOTE — Telephone Encounter (Signed)
Patient called and l/m asking about his refills. Not sure if he received message from [place]. Please call patient back today per patient request. [phone]

## 2018-09-06 NOTE — Telephone Encounter (Signed)
Spoken and notified patient of Tawni Millers comments. Patient is agreeable to Kate's term regarding the medications. Patient verbalized understanding.

## 2018-09-06 NOTE — Telephone Encounter (Signed)
Noted.  Prescription sent to pharmacy. 

## 2018-09-06 NOTE — Telephone Encounter (Signed)
Message left for patient to return my call.  

## 2018-09-06 NOTE — Addendum Note (Signed)
Addended by: Pleas Koch on: 09/06/2018 04:10 PM   Modules accepted: Orders

## 2018-09-14 ENCOUNTER — Other Ambulatory Visit: Payer: Self-pay | Admitting: Primary Care

## 2018-09-14 NOTE — Telephone Encounter (Signed)
He needs a visit for BP check, can we do WebEx or phone visit?

## 2018-09-14 NOTE — Telephone Encounter (Signed)
Message left for patient to return my call.  

## 2018-09-14 NOTE — Telephone Encounter (Signed)
Received faxed refill request for  metoprolol tartrate (LOPRESSOR) 25 mg  Rx have not bee prescribed. Last seen on 08/16/2018.

## 2018-09-15 ENCOUNTER — Ambulatory Visit (INDEPENDENT_AMBULATORY_CARE_PROVIDER_SITE_OTHER): Payer: Self-pay | Admitting: Primary Care

## 2018-09-15 ENCOUNTER — Other Ambulatory Visit: Payer: Self-pay

## 2018-09-15 DIAGNOSIS — I482 Chronic atrial fibrillation, unspecified: Secondary | ICD-10-CM

## 2018-09-15 DIAGNOSIS — M5441 Lumbago with sciatica, right side: Secondary | ICD-10-CM

## 2018-09-15 DIAGNOSIS — I10 Essential (primary) hypertension: Secondary | ICD-10-CM

## 2018-09-15 DIAGNOSIS — G8929 Other chronic pain: Secondary | ICD-10-CM

## 2018-09-15 MED ORDER — METOPROLOL TARTRATE 25 MG PO TABS: 25.0000 mg | ORAL_TABLET | Freq: Two times a day (BID) | ORAL | 3 refills | Status: DC

## 2018-09-15 NOTE — Patient Instructions (Addendum)
Continue lisinopril 20 mg for blood pressure. Start metoprolol tartrate 25 mg twice daily for blood pressure and heart rate.  Pick up your Xarelto as discussed today.  Monitor your blood pressure and notify me if you see readings at or above 135/90 consistently.  We will schedule a follow-up visit for 2 months for blood pressure check and labs.

## 2018-09-15 NOTE — Assessment & Plan Note (Signed)
Has not yet picked up Xarelto, strongly advised to do this today.  Discussed coupon/prescription savings cards.  Prescription for metoprolol tartrate sent to pharmacy.

## 2018-09-15 NOTE — Telephone Encounter (Signed)
Spoken to patient and schedule a telephone call appointment on 09/15/2018

## 2018-09-15 NOTE — Assessment & Plan Note (Signed)
Seems about the same with lisinopril 20 mg based off of home readings.  We will add in metoprolol tartrate 25 mg twice daily as he was taking this prior.  Discussed to monitor blood pressure and report readings that are consistently at or above 135/90.

## 2018-09-15 NOTE — Assessment & Plan Note (Signed)
He has not yet picked up amitriptyline or gabapentin, he plans on doing this today.  He is aware of his appointment with pain management later this month.

## 2018-09-15 NOTE — Progress Notes (Signed)
Subjective:    Patient ID: Kyle [name], male    DOB: January 28, 1956, 63 y.o.   MRN: [id number]  HPI     Kyle [name] - 63 y.o. male   MRN [id number]   Date of Birth: May 04, 1956  PCP: Pleas Koch, NP  This service was provided via telemedicine. Phone Visit performed on 09/15/2018    Rationale for phone visit along with limitations reviewed. Patient consented to telephone encounter.    Location of patient: Home Location of provider: Office at [employer] @ [facility] Name of referring provider: N/A   Names of persons and role in encounter: Provider: Pleas Koch, NP  Patient: Kyle [name]  Other: N/A   Time on call: 5 minutes 32 seconds   Subjective: CC: Follow up of hypertension HPI:  Kyle [name] is a 63 year old male who presents today via phone for follow up of hypertension.   He was last evaluated on 08/16/18 as a new patient, had been out of his antihypertensive medication for the prior three weeks. Was managed on lisinopril 20 mg and metoprolol tartrate 25 mg BID. His lisinopril was re-initiated at 20 mg, plan was to re-initiate metoprolol tartrate during follow up visit.   Since his last visit he's been checking his BP which is running 140's/80's. He endorses compliance to his lisinopril daily.   BP Readings from Last 3 Encounters:  08/16/18 (!) 144/86  08/30/15 130/82  08/28/15 122/66   2) Chronic Atrial Fibrillation: Noted during prior exam on 08/16/18, he had been off of Xarelto 20 mg and metoprolol tartrate for quite some time. ECG that day consistent for atrial fibrillation so his Xarelto was sent to the pharmacy.  Today he endorses that he has not yet started his Xarelto as it was too costly. He does have a prescription savings card that he plans on using today.   3) Chronic Pain: Currently managed on amitriptyline 50 mg HS and gabapentin 600 mg TID, this was prescribed by his prior PCP. He had been out for several weeks. Re-initiated on these  medications last week. He has an appointment with pain management on April 24th 2020.  He's not yet picked up the amitriptyline or gabapentin yet, plans on doing this today. He is aware of his upcoming appointment.    Objective/Observations:   No physical exam or vital signs collected unless specifically identified below.   There were no vitals taken for this visit.   Respiratory status: speaks in complete sentences without evident shortness of breath.   Assessment/Plan:  See problem based charting.  No problem-specific Assessment & Plan notes found for this encounter.   I discussed the assessment and treatment plan with the patient. The patient was provided an opportunity to ask questions and all were answered. The patient agreed with the plan and demonstrated an understanding of the instructions.  Lab Orders  No laboratory test(s) ordered today    No orders of the defined types were placed in this encounter.   The patient was advised to call back or seek an in-person evaluation if the symptoms worsen or if the condition fails to improve as anticipated.  Pleas Koch, NP    Review of Systems  Eyes: Negative for visual disturbance.  Respiratory: Negative for shortness of breath.   Cardiovascular: Negative for chest pain.  Musculoskeletal: Positive for arthralgias and back pain.  Neurological: Negative for dizziness and headaches.       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 AND DOES NOT HAVE MASK OR MACHINE AT HOME   Stroke [facility])    Wears dentures      Social History   Socioeconomic History   Marital status: Single    Spouse name: Not on file   Number of children: Not on file   Years of education: Not on file   Highest education level: Not on file  Occupational History   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 strain: Not on file   Food insecurity:    Worry: Not on file    Inability: Not on file   Transportation needs:    Medical: Not on file    Non-medical: Not on file  Tobacco Use   Smoking status: Current Every Day Smoker    Packs/day: 0.50    Years: 40.00    Pack years: 20.00    Types: Cigarettes   Smokeless tobacco: Never Used  Substance and Sexual Activity   Alcohol use: Yes    Alcohol/week: 0.0 standard drinks    Comment: RARELY   Drug use: No   Sexual activity: Not on file  Lifestyle   Physical activity:    Days per week: Not on file    Minutes per session: Not on file   Stress: Not on file  Relationships   Social connections:    Talks on phone: Not on file    Gets together: Not on file    Attends religious service: Not on file    Active member of club or organization: Not on file    Attends meetings of clubs or organizations: Not on file    Relationship status: Not on file   Intimate partner violence:    Fear of current or ex partner: Not on file    Emotionally abused: Not on file    Physically abused: Not on file    Forced sexual activity: Not on file  Other Topics Concern   Not on file  Social History Narrative   Last annual exam from 22 (old electronic system) was 04/04/2012     Past Surgical History:  Procedure Laterality Date   ESOPHAGOGASTRODUODENOSCOPY  05/18/2011   Procedure: ESOPHAGOGASTRODUODENOSCOPY (EGD);  Surgeon: Shann Medal, MD;  Location: [facility];  Service: Endoscopy;  Laterality: N/A;     FOOT SURGERY  2003   right foot surgery from work accident    GASTRIC BYPASS  10/19/11   GASTRIC RESTRICTION [place]   calcium deposit removed on right  leg     Family History  Problem Relation Age of Onset   Cancer Mother        melanoma   Heart disease Mother    Lung cancer Father        melanoma   Heart disease Father    Heart disease Sister  Rheum arthritis Mother     Allergies  Allergen Reactions   Vioxx [Rofecoxib]     "BAD RASH-LIKE RED RASH"    Current Outpatient Medications on File Prior to Visit  Medication Sig Dispense Refill   albuterol (PROVENTIL HFA;VENTOLIN HFA) 108 (90 Base) MCG/ACT inhaler Inhale 2 puffs into the lungs every 4 (four) hours as needed for wheezing or shortness of breath. 1 Inhaler 5   amitriptyline (ELAVIL) 50 MG tablet Take 1 tablet (50 mg total) by mouth at bedtime. 30 tablet 0   atorvastatin (LIPITOR) 20 MG tablet Take 1 tablet (20 mg total) by mouth daily. For cholesterol. 90 tablet 3   gabapentin (NEURONTIN) 600 MG tablet Take 1 tablet (600 mg total) by mouth 3 (three) times daily. 90 tablet 0   lisinopril (PRINIVIL,ZESTRIL) 20 MG tablet Take 1 tablet (20 mg total) by mouth daily. For blood pressure. 90 tablet 0   rivaroxaban (XARELTO) 20 MG TABS tablet Take 1 tablet (20 mg total) by mouth daily with supper. For blood clot prevention. 90 tablet 0   No current facility-administered medications on file prior to visit.     There were no vitals taken for this visit.   Objective:   Physical Exam  Constitutional: He is oriented to person, place, and time.  Respiratory: Effort normal.  Neurological: He is alert and oriented to person, place, and time.  Psychiatric: He has a normal mood and affect.           Assessment & Plan:
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
## 2018-10-03 ENCOUNTER — Encounter: Payer: Self-pay | Admitting: Student in an Organized Health Care Education/Training Program

## 2018-10-04 ENCOUNTER — Other Ambulatory Visit: Payer: Self-pay

## 2018-10-04 ENCOUNTER — Ambulatory Visit
Payer: Medicare Other | Attending: Student in an Organized Health Care Education/Training Program | Admitting: Student in an Organized Health Care Education/Training Program

## 2018-10-04 DIAGNOSIS — G894 Chronic pain syndrome: Secondary | ICD-10-CM

## 2018-10-04 DIAGNOSIS — M25551 Pain in right hip: Secondary | ICD-10-CM

## 2018-10-04 DIAGNOSIS — M5441 Lumbago with sciatica, right side: Secondary | ICD-10-CM

## 2018-10-04 DIAGNOSIS — I482 Chronic atrial fibrillation, unspecified: Secondary | ICD-10-CM

## 2018-10-04 DIAGNOSIS — I10 Essential (primary) hypertension: Secondary | ICD-10-CM

## 2018-10-04 DIAGNOSIS — F4323 Adjustment disorder with mixed anxiety and depressed mood: Secondary | ICD-10-CM

## 2018-10-04 DIAGNOSIS — G8929 Other chronic pain: Secondary | ICD-10-CM

## 2018-10-10 ENCOUNTER — Other Ambulatory Visit: Payer: Self-pay | Admitting: Primary Care

## 2018-10-10 DIAGNOSIS — G8929 Other chronic pain: Secondary | ICD-10-CM

## 2018-10-10 DIAGNOSIS — M5441 Lumbago with sciatica, right side: Principal | ICD-10-CM

## 2018-10-10 NOTE — Telephone Encounter (Signed)
Last prescribed on 09/06/2018. Last office visit on 09/15/2018. No future appointment

## 2018-10-11 NOTE — Telephone Encounter (Signed)
Patient currently under evaluation with pain management.  Will refill gabapentin until he is accepted.

## 2018-10-14 ENCOUNTER — Other Ambulatory Visit: Payer: Self-pay | Admitting: Primary Care

## 2018-10-14 DIAGNOSIS — G8929 Other chronic pain: Secondary | ICD-10-CM

## 2018-10-14 DIAGNOSIS — M5441 Lumbago with sciatica, right side: Principal | ICD-10-CM

## 2018-10-14 NOTE — Telephone Encounter (Signed)
Noted, refill sent to pharmacy. Patient to be established with pain management.

## 2018-10-14 NOTE — Telephone Encounter (Signed)
Last prescribed on 09/06/2018 #30 with 0 refills. Last office visit on 09/15/2018. No future appointment

## 2018-11-03 ENCOUNTER — Other Ambulatory Visit: Payer: Self-pay | Admitting: Primary Care

## 2018-11-03 DIAGNOSIS — M5441 Lumbago with sciatica, right side: Secondary | ICD-10-CM

## 2018-11-03 DIAGNOSIS — G8929 Other chronic pain: Secondary | ICD-10-CM

## 2019-01-21 ENCOUNTER — Other Ambulatory Visit: Payer: Self-pay

## 2019-01-21 ENCOUNTER — Encounter (HOSPITAL_COMMUNITY): Payer: Self-pay

## 2019-01-21 ENCOUNTER — Emergency Department (HOSPITAL_COMMUNITY)
Admission: EM | Admit: 2019-01-21 | Discharge: 2019-01-22 | Disposition: A | Discharge status: Home / Self Care | Payer: Medicare PPO | Attending: Emergency Medicine | Admitting: Emergency Medicine

## 2019-01-21 DIAGNOSIS — Z7901 Long term (current) use of anticoagulants: Secondary | ICD-10-CM | POA: Diagnosis not present

## 2019-01-21 DIAGNOSIS — Y92007 Garden or yard of unspecified non-institutional (private) residence as the place of occurrence of the external cause: Secondary | ICD-10-CM | POA: Insufficient documentation

## 2019-01-21 DIAGNOSIS — Y999 Unspecified external cause status: Secondary | ICD-10-CM | POA: Diagnosis not present

## 2019-01-21 DIAGNOSIS — Z23 Encounter for immunization: Secondary | ICD-10-CM | POA: Diagnosis not present

## 2019-01-21 DIAGNOSIS — S61431A Puncture wound without foreign body of right hand, initial encounter: Secondary | ICD-10-CM | POA: Insufficient documentation

## 2019-01-21 DIAGNOSIS — F17210 Nicotine dependence, cigarettes, uncomplicated: Secondary | ICD-10-CM | POA: Diagnosis not present

## 2019-01-21 DIAGNOSIS — Y93H2 Activity, gardening and landscaping: Secondary | ICD-10-CM | POA: Diagnosis not present

## 2019-01-21 DIAGNOSIS — Z79899 Other long term (current) drug therapy: Secondary | ICD-10-CM | POA: Insufficient documentation

## 2019-01-21 DIAGNOSIS — W5911XA Bitten by nonvenomous snake, initial encounter: Secondary | ICD-10-CM | POA: Insufficient documentation

## 2019-01-21 DIAGNOSIS — T63001A Toxic effect of unspecified snake venom, accidental (unintentional), initial encounter: Secondary | ICD-10-CM

## 2019-01-21 DIAGNOSIS — I739 Peripheral vascular disease, unspecified: Secondary | ICD-10-CM | POA: Insufficient documentation

## 2019-01-21 DIAGNOSIS — I10 Essential (primary) hypertension: Secondary | ICD-10-CM | POA: Diagnosis not present

## 2019-01-21 DIAGNOSIS — I482 Chronic atrial fibrillation, unspecified: Secondary | ICD-10-CM

## 2019-01-21 LAB — CBC WITH DIFFERENTIAL/PLATELET
Abs Immature Granulocytes: 0.02 10*3/uL (ref 0.00–0.07)
Basophils Absolute: 0 10*3/uL (ref 0.0–0.1)
Basophils Relative: 0 %
Eosinophils Absolute: 0.2 10*3/uL (ref 0.0–0.5)
Eosinophils Relative: 3 %
HCT: 40.8 % (ref 39.0–52.0)
Hemoglobin: 13.1 g/dL (ref 13.0–17.0)
Immature Granulocytes: 0 %
Lymphocytes Relative: 16 %
Lymphs Abs: 1.2 10*3/uL (ref 0.7–4.0)
MCH: 28.8 pg (ref 26.0–34.0)
MCHC: 32.1 g/dL (ref 30.0–36.0)
MCV: 89.7 fL (ref 80.0–100.0)
Monocytes Absolute: 0.9 10*3/uL (ref 0.1–1.0)
Monocytes Relative: 11 %
Neutro Abs: 5.3 10*3/uL (ref 1.7–7.7)
Neutrophils Relative %: 70 %
Platelets: 221 10*3/uL (ref 150–400)
RBC: 4.55 MIL/uL (ref 4.22–5.81)
RDW: 15.1 % (ref 11.5–15.5)
WBC: 7.6 10*3/uL (ref 4.0–10.5)
nRBC: 0 % (ref 0.0–0.2)

## 2019-01-21 LAB — PROTIME-INR
INR: 1.3 — ABNORMAL HIGH (ref 0.8–1.2)
Prothrombin Time: 15.9 seconds — ABNORMAL HIGH (ref 11.4–15.2)

## 2019-01-21 LAB — BASIC METABOLIC PANEL
Anion gap: 11 (ref 5–15)
BUN: 12 mg/dL (ref 8–23)
CO2: 25 mmol/L (ref 22–32)
Calcium: 9.1 mg/dL (ref 8.9–10.3)
Chloride: 101 mmol/L (ref 98–111)
Creatinine, Ser: 0.9 mg/dL (ref 0.61–1.24)
GFR calc Af Amer: >60 mL/min (ref >60)
GFR calc non Af Amer: >60 mL/min (ref >60)
Glucose, Bld: 100 mg/dL — ABNORMAL HIGH (ref 70–99)
Potassium: 3.5 mmol/L (ref 3.5–5.1)
Sodium: 137 mmol/L (ref 135–145)

## 2019-01-21 LAB — FIBRINOGEN: Fibrinogen: 471 mg/dL (ref 210–475)

## 2019-01-21 MED ORDER — TETANUS-DIPHTH-ACELL PERTUSSIS 5-2.5-18.5 LF-MCG/0.5 IM SUSP
0.5000 mL | Freq: Once | INTRAMUSCULAR | Status: AC
  Administered 2019-01-21: 20:00:00 0.5 mL via INTRAMUSCULAR
  Filled 2019-01-21: qty 0.5

## 2019-01-21 MED ORDER — OXYCODONE-ACETAMINOPHEN 5-325 MG PO TABS
1.0000 | ORAL_TABLET | Freq: Once | ORAL | Status: AC
  Administered 2019-01-21: 1 via ORAL
  Filled 2019-01-21: qty 1

## 2019-01-21 NOTE — ED Triage Notes (Signed)
Pt brought in by [facility] from home following being bitten by a snake in his right hand while working on his yard. Pt has two obvious puncture wounds to the palm of his right hand. Pt states he is positive he was bitten by a copperhead. Pt states he killed the snake immediately. Pt has some swelling noted to right hand. Pt hand marked by EMS. Pt endorses mild-moderate pain to right hand. Pt has full ROM to extremity.

## 2019-01-21 NOTE — ED Provider Notes (Signed)
[facility] EMERGENCY DEPARTMENT Provider Note   CSN: [id number] Arrival date & time: 01/21/19  1807     History   Chief Complaint Chief Complaint  Patient presents with  . Animal Bite    HPI Kyle [name] is a 63 y.o. male.     Patient with history of atrial fibrillation presents after copperhead envenomation.  Patient was working in his yard weed eating approximately 1 hour ago.  He reached down to pick up a piece of gutter and was bitten by a copperhead on his right palm.  Patient was able to remove it and kill the snake.  Patient was advised to call the ambulance by a friend and patient was transported to the hospital.  He currently complains of pain in his hand, wrist, and forearm with decreased sensation in the forearm.  He reports some nausea but no vomiting.  No respiratory symptoms or shortness of breath.  He denies headache or confusion.  No current blood thinner use, only daily aspirin.  Onset of symptoms acute.  Course is constant.     Past Medical History:  Diagnosis Date  . Arthritis   . Atrial fibrillation    HX OF SICK SINUS SYNDROME-ATRIAL FIB  . Back pain, chronic    PT STATES 5 BULGING DISKS AND PINCHED NERVES--PT ON OXYCODONE 4 TIMES A DAY FOR HIS PAIN  . Chest pain 08/26/2015  . Chronic airway obstruction, not elsewhere classified 12/21/2012  . GERD (gastroesophageal reflux disease)   . Hypertension   . Impaired fasting glucose 04/19/2013  . Peripheral vascular disease (HCC)    CHRONIC VENOUS INSUFFICIENCY  . Shortness of breath   . 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 Stroke ([place])   . Wears dentures     Patient Active Problem List   Diagnosis Date Noted  . Chronic atrial fibrillation 08/26/2015  . Hyperlipidemia LDL goal <100 06/26/2014  . Routine general medical examination at a health care facility 11/21/2013  . Screening PSA (prostate specific  antigen) 11/21/2013  . Special screening for malignant neoplasms, colon 11/21/2013  . OSA (obstructive sleep apnea) 08/22/2013  . Morbid obesity ([place]) 04/19/2013  . Other emphysema ([place]) 01/31/2013  . Essential hypertension 12/21/2012  . Insomnia 12/21/2012  . GERD (gastroesophageal reflux disease) 09/20/2012  . Back pain, chronic 09/20/2012  . Obesity hypoventilation syndrome ([place]) 09/20/2012  . Adjustment disorder with mixed anxiety and depressed mood 09/20/2012    Past Surgical History:  Procedure Laterality Date  . ESOPHAGOGASTRODUODENOSCOPY  05/18/2011   Procedure: ESOPHAGOGASTRODUODENOSCOPY (EGD);  Surgeon: Shann Medal, MD;  Location: [facility];  Service: Endoscopy;  Laterality: N/A;  . FOOT SURGERY  2003   right foot surgery from work accident   . GASTRIC BYPASS  10/19/11  . GASTRIC RESTRICTION SURGERY  1992  . LEG SURGERY  1998   calcium deposit removed on right  leg         Home Medications    Prior to Admission medications   Medication Sig Start Date End Date Taking? Authorizing Provider  albuterol (PROVENTIL HFA;VENTOLIN HFA) 108 (90 Base) MCG/ACT inhaler Inhale 2 puffs into the lungs every 4 (four) hours as needed for wheezing or shortness of breath. 08/30/15   Gildardo Cranker, DO  amitriptyline (ELAVIL) 50 MG tablet TAKE 1 TABLET BY MOUTH EVERYDAY AT BEDTIME 10/14/18   Pleas Koch, NP  atorvastatin (LIPITOR) 20  MG tablet Take 1 tablet (20 mg total) by mouth daily. For cholesterol. 08/24/18   Pleas Koch, NP  gabapentin (NEURONTIN) 600 MG tablet TAKE 1 TABLET BY MOUTH THREE TIMES A DAY 10/11/18   Pleas Koch, NP  lisinopril (PRINIVIL,ZESTRIL) 20 MG tablet Take 1 tablet (20 mg total) by mouth daily. For blood pressure. 08/16/18   Pleas Koch, NP  metoprolol tartrate (LOPRESSOR) 25 MG tablet Take 1 tablet (25 mg total) by mouth 2 (two) times daily. For blood pressure and heart rate. 09/15/18   Pleas Koch, NP  rivaroxaban (XARELTO) 20 MG  TABS tablet Take 1 tablet (20 mg total) by mouth daily with supper. For blood clot prevention. Patient not taking: Reported on 10/03/2018 08/24/18   Pleas Koch, NP    Family History Family History  Problem Relation Age of Onset  . Cancer Mother        melanoma  . Heart disease Mother   . Rheum arthritis Mother   . Lung cancer Father        melanoma  . Heart disease Father   . Heart disease Sister     Social History Social History   Tobacco Use  . Smoking status: Current Every Day Smoker    Packs/day: 0.50    Years: 40.00    Pack years: 20.00    Types: Cigarettes  . Smokeless tobacco: Never Used  Substance Use Topics  . Alcohol use: Yes    Alcohol/week: 0.0 standard drinks    Comment: RARELY  . Drug use: No     Allergies   Vioxx [rofecoxib]   Review of Systems Review of Systems  Constitutional: Negative for fever.  HENT: Negative for rhinorrhea and sore throat.   Eyes: Negative for redness.  Respiratory: Negative for cough and shortness of breath.   Cardiovascular: Negative for chest pain.  Gastrointestinal: Negative for abdominal pain, diarrhea, nausea and vomiting.  Genitourinary: Negative for dysuria.  Musculoskeletal: Positive for myalgias.  Skin: Positive for color change and wound. Negative for rash.  Neurological: Positive for numbness. Negative for headaches.  Psychiatric/Behavioral: Negative for confusion.     Physical Exam Updated Vital Signs BP 119/72   Pulse 70   Temp 98 F (36.7 C)   Resp 17   SpO2 96%   Physical Exam Vitals signs and nursing note reviewed.  Constitutional:      Appearance: He is well-developed.  HENT:     Head: Normocephalic and atraumatic.  Eyes:     General:        Right eye: No discharge.        Left eye: No discharge.     Conjunctiva/sclera: Conjunctivae normal.  Neck:     Musculoskeletal: Normal range of motion and neck supple.  Cardiovascular:     Rate and Rhythm: Normal rate and regular rhythm.      Heart sounds: Normal heart sounds.  Pulmonary:     Effort: Pulmonary effort is normal.     Breath sounds: Normal breath sounds.  Abdominal:     Palpations: Abdomen is soft.     Tenderness: There is no abdominal tenderness.  Musculoskeletal:        General: Swelling and tenderness present.     Comments: Patient with fang marks noted on the right palm just distal to the wrist on the ulnar aspect.  Mild localized erythema extending to the forearm.  Muscle bellies are soft.  Patient reports numbness in his middle, ring, and little  fingers on the side and numbness that extends to the forearm.  Skin:    General: Skin is warm and dry.  Neurological:     Mental Status: He is alert.      ED Treatments / Results  Labs (all labs ordered are listed, but only abnormal results are displayed) Labs Reviewed  BASIC METABOLIC PANEL - Abnormal; Notable for the following components:      Result Value   Glucose, Bld 100 (*)    All other components within normal limits  PROTIME-INR - Abnormal; Notable for the following components:   Prothrombin Time 15.9 (*)    INR 1.3 (*)    All other components within normal limits  CBC WITH DIFFERENTIAL/PLATELET  FIBRINOGEN  CBC WITH DIFFERENTIAL/PLATELET  PROTIME-INR  FIBRINOGEN    EKG None  Radiology No results found.  Procedures Procedures (including critical care time)  Medications Ordered in ED Medications  Tdap (BOOSTRIX) injection 0.5 mL (0.5 mLs Intramuscular Given 01/21/19 1937)  oxyCODONE-acetaminophen (PERCOCET/ROXICET) 5-325 MG per tablet 1 tablet (1 tablet Oral Given 01/21/19 2104)     Initial Impression / Assessment and Plan / ED Course  I have reviewed the triage vital signs and the nursing notes.  Pertinent labs & imaging results that were available during my care of the patient were reviewed by me and considered in my medical decision making (see chart for details).        Patient seen and examined. Initial snakebite severity  score = 3 without lab results. Work-up initiated. RN to call poison control for treatment guidance.   Vital signs reviewed and are as follows: BP 119/72   Pulse 70   Temp 98 F (36.7 C)   Resp 17   SpO2 96%   7:00 PM RN has spoken with poison control. Pt will need observed for 6 hrs. Labs pending. [employer] if rapid progression or worsening. Will monitor.    9:14 PM Patient reexamined. I ordered oral pain medication and he can eat. Continues to have pain but objectively no change on exam.   12:09 AM patient seen earlier with Dr. Jeanell Sparrow.  He became more somnolent but is protecting airway.  No hypoxia.  This was after 5mg /325mg  of Percocet.  Patient is on this chronically at home.  Awaiting repeat lab work-up and reassessment.  Regarding snakebite, right upper extremity exam is stable.  Anticipate discharge to home if stable and labs are stable.  Handoff to [facility] at shift change.  Final Clinical Impressions(s) / ED Diagnoses   Final diagnoses:  Venomous snake bite, accidental or unintentional, initial encounter   Patient here after alleged copperhead bite.  Pending recheck of lab work and reexam.  ED Discharge Orders    None       Carlisle Cater, [place] 01/22/19 0010    Pattricia Boss, MD 01/23/19 [phone]

## 2019-01-22 LAB — CBC WITH DIFFERENTIAL/PLATELET
Abs Immature Granulocytes: 0.02 10*3/uL (ref 0.00–0.07)
Basophils Absolute: 0 10*3/uL (ref 0.0–0.1)
Basophils Relative: 1 %
Eosinophils Absolute: 0.3 10*3/uL (ref 0.0–0.5)
Eosinophils Relative: 5 %
HCT: 37.9 % — ABNORMAL LOW (ref 39.0–52.0)
Hemoglobin: 11.9 g/dL — ABNORMAL LOW (ref 13.0–17.0)
Immature Granulocytes: 0 %
Lymphocytes Relative: 20 %
Lymphs Abs: 1.3 10*3/uL (ref 0.7–4.0)
MCH: 28.8 pg (ref 26.0–34.0)
MCHC: 31.4 g/dL (ref 30.0–36.0)
MCV: 91.8 fL (ref 80.0–100.0)
Monocytes Absolute: 0.7 10*3/uL (ref 0.1–1.0)
Monocytes Relative: 11 %
Neutro Abs: 3.9 10*3/uL (ref 1.7–7.7)
Neutrophils Relative %: 63 %
Platelets: 211 10*3/uL (ref 150–400)
RBC: 4.13 MIL/uL — ABNORMAL LOW (ref 4.22–5.81)
RDW: 15.1 % (ref 11.5–15.5)
WBC: 6.1 10*3/uL (ref 4.0–10.5)
nRBC: 0 % (ref 0.0–0.2)

## 2019-01-22 LAB — PROTIME-INR
INR: 1.3 — ABNORMAL HIGH (ref 0.8–1.2)
Prothrombin Time: 16.3 seconds — ABNORMAL HIGH (ref 11.4–15.2)

## 2019-01-22 LAB — FIBRINOGEN: Fibrinogen: 443 mg/dL (ref 210–475)

## 2019-01-22 NOTE — ED Notes (Signed)
Pt sitting on side of bed, lethargic, assisted pt back to bed

## 2019-01-22 NOTE — ED Notes (Signed)
Pt ambulated with walker to hallway and back with steady gait. Pt denies having a way home

## 2019-01-22 NOTE — ED Notes (Signed)
Noted to be SB rate of 45-50 on monitor; pt sleeping, responds to name and opens his eyes when called.

## 2019-01-22 NOTE — ED Provider Notes (Signed)
2:06 AM Patient care assumed from [facility], PA-C at change of shift.  Patient was reportedly bitten by a copperhead.  I have checked his wound site on his right palm.  There is no surrounding erythema or proximal, progressive swelling to the extremity.  Patient sleeping, but easily aroused to loud voice.  He does seem fairly somnolent.  Was given oxycodone on arrival, but is prescribed this chronically.  Noted to be in atrial fibrillation on the bedside monitor, bradycardic rate.  On chart review, patient is often chronically in atrial fibrillation.  He is prescribed daily Xarelto.  Plan for ambulation in the ED.  If patient is able to ambulate steadily, will proceed with plan for discharge.  3:51 AM Patient ambulated with walker in hallway with steady gait. He ambulates with a walker at baseline, per patient.  4:02 AM Patient sitting upright on the side of the bed having good, meaningful conversation with the nurse.  Denies having the way home.  Attempting to assist in providing a cab voucher.  Anticipate discharge home once method of transportation determined.   Antonietta Breach, PA-C [id number] [phone]    Delora Fuel, MD 03/31/50 [phone]

## 2019-01-22 NOTE — ED Notes (Signed)
Poison Control updated on pts labs

## 2019-01-24 ENCOUNTER — Encounter: Payer: Self-pay | Admitting: Internal Medicine

## 2019-01-26 ENCOUNTER — Encounter: Payer: Self-pay | Admitting: Internal Medicine

## 2019-04-05 ENCOUNTER — Encounter: Payer: Self-pay | Admitting: Gastroenterology

## 2019-04-21 ENCOUNTER — Emergency Department (HOSPITAL_COMMUNITY)
Admission: EM | Admit: 2019-04-21 | Discharge: 2019-04-21 | Disposition: A | Discharge status: Home / Self Care | Payer: Medicare PPO | Attending: Emergency Medicine | Admitting: Emergency Medicine

## 2019-04-21 ENCOUNTER — Encounter (HOSPITAL_COMMUNITY): Payer: Self-pay

## 2019-04-21 ENCOUNTER — Other Ambulatory Visit: Payer: Self-pay

## 2019-04-21 ENCOUNTER — Emergency Department (HOSPITAL_COMMUNITY): Payer: Medicare PPO

## 2019-04-21 DIAGNOSIS — S7001XA Contusion of right hip, initial encounter: Secondary | ICD-10-CM | POA: Insufficient documentation

## 2019-04-21 DIAGNOSIS — Y929 Unspecified place or not applicable: Secondary | ICD-10-CM | POA: Insufficient documentation

## 2019-04-21 DIAGNOSIS — Z8673 Personal history of transient ischemic attack (TIA), and cerebral infarction without residual deficits: Secondary | ICD-10-CM | POA: Insufficient documentation

## 2019-04-21 DIAGNOSIS — S93601A Unspecified sprain of right foot, initial encounter: Secondary | ICD-10-CM | POA: Diagnosis not present

## 2019-04-21 DIAGNOSIS — S0990XA Unspecified injury of head, initial encounter: Secondary | ICD-10-CM | POA: Diagnosis not present

## 2019-04-21 DIAGNOSIS — Z79899 Other long term (current) drug therapy: Secondary | ICD-10-CM | POA: Diagnosis not present

## 2019-04-21 DIAGNOSIS — Y999 Unspecified external cause status: Secondary | ICD-10-CM | POA: Diagnosis not present

## 2019-04-21 DIAGNOSIS — Y939 Activity, unspecified: Secondary | ICD-10-CM | POA: Diagnosis not present

## 2019-04-21 DIAGNOSIS — Z7982 Long term (current) use of aspirin: Secondary | ICD-10-CM | POA: Insufficient documentation

## 2019-04-21 DIAGNOSIS — S79911A Unspecified injury of right hip, initial encounter: Secondary | ICD-10-CM | POA: Diagnosis present

## 2019-04-21 DIAGNOSIS — I10 Essential (primary) hypertension: Secondary | ICD-10-CM | POA: Diagnosis not present

## 2019-04-21 DIAGNOSIS — F17210 Nicotine dependence, cigarettes, uncomplicated: Secondary | ICD-10-CM | POA: Diagnosis not present

## 2019-04-21 DIAGNOSIS — W010XXA Fall on same level from slipping, tripping and stumbling without subsequent striking against object, initial encounter: Secondary | ICD-10-CM | POA: Diagnosis not present

## 2019-04-21 IMAGING — CR DG FOOT COMPLETE 3+V*R*
3 series · 3 of 3 positions shown · non-contrast
Comparison: [DATE]

CLINICAL DATA: Fall, pain

EXAM:
RIGHT FOOT COMPLETE - 3+ VIEW; RIGHT ANKLE - COMPLETE 3+ VIEW

[x foot lat right (1 of 2)]
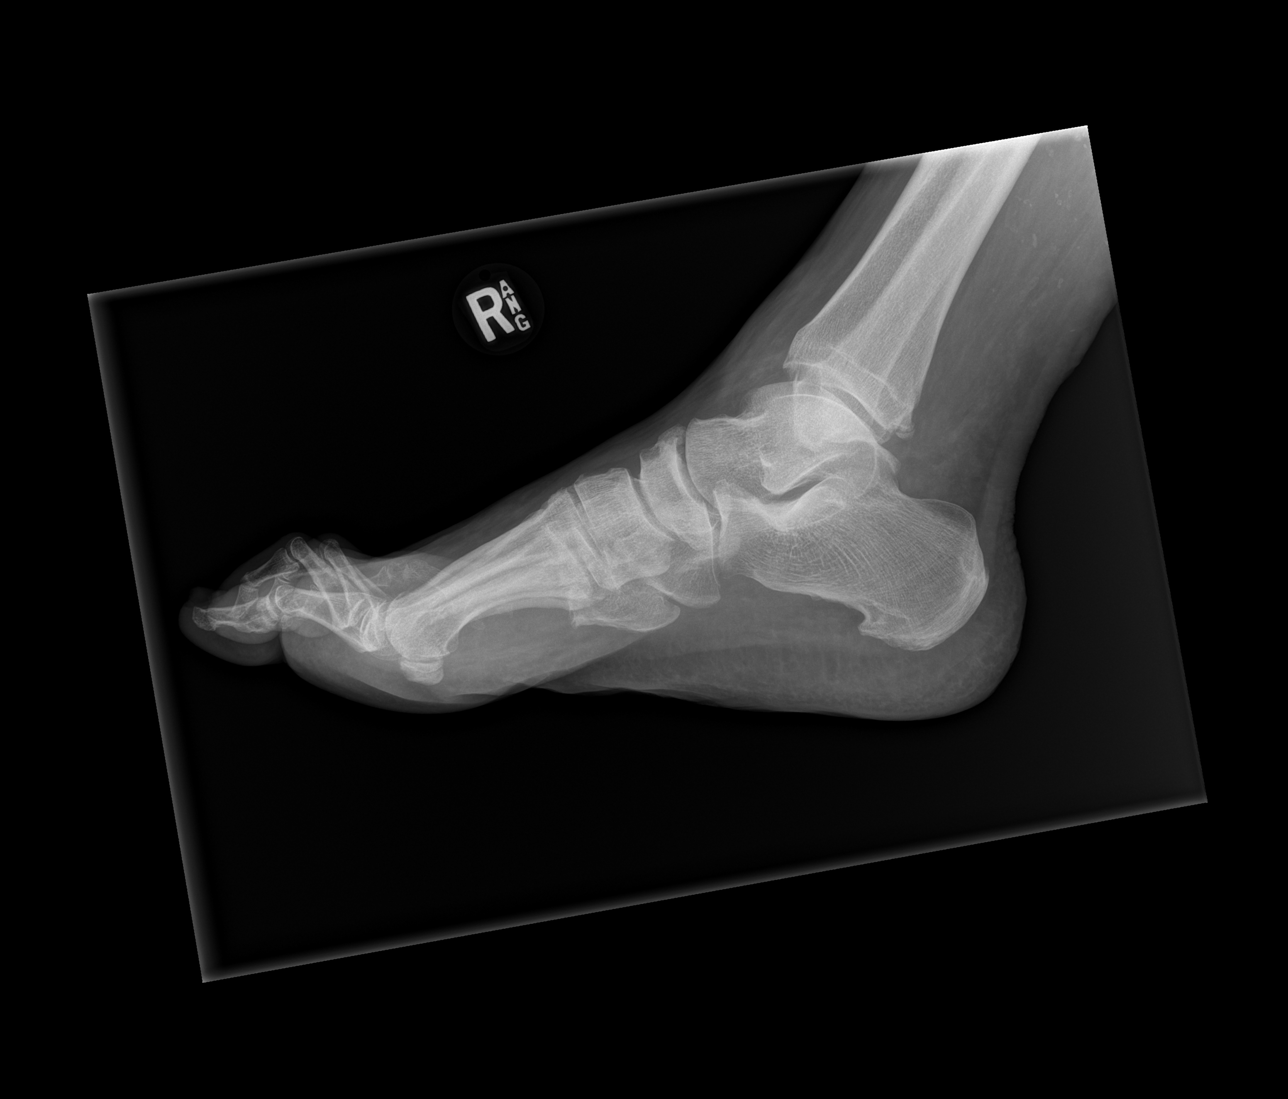

[x foot obl right]
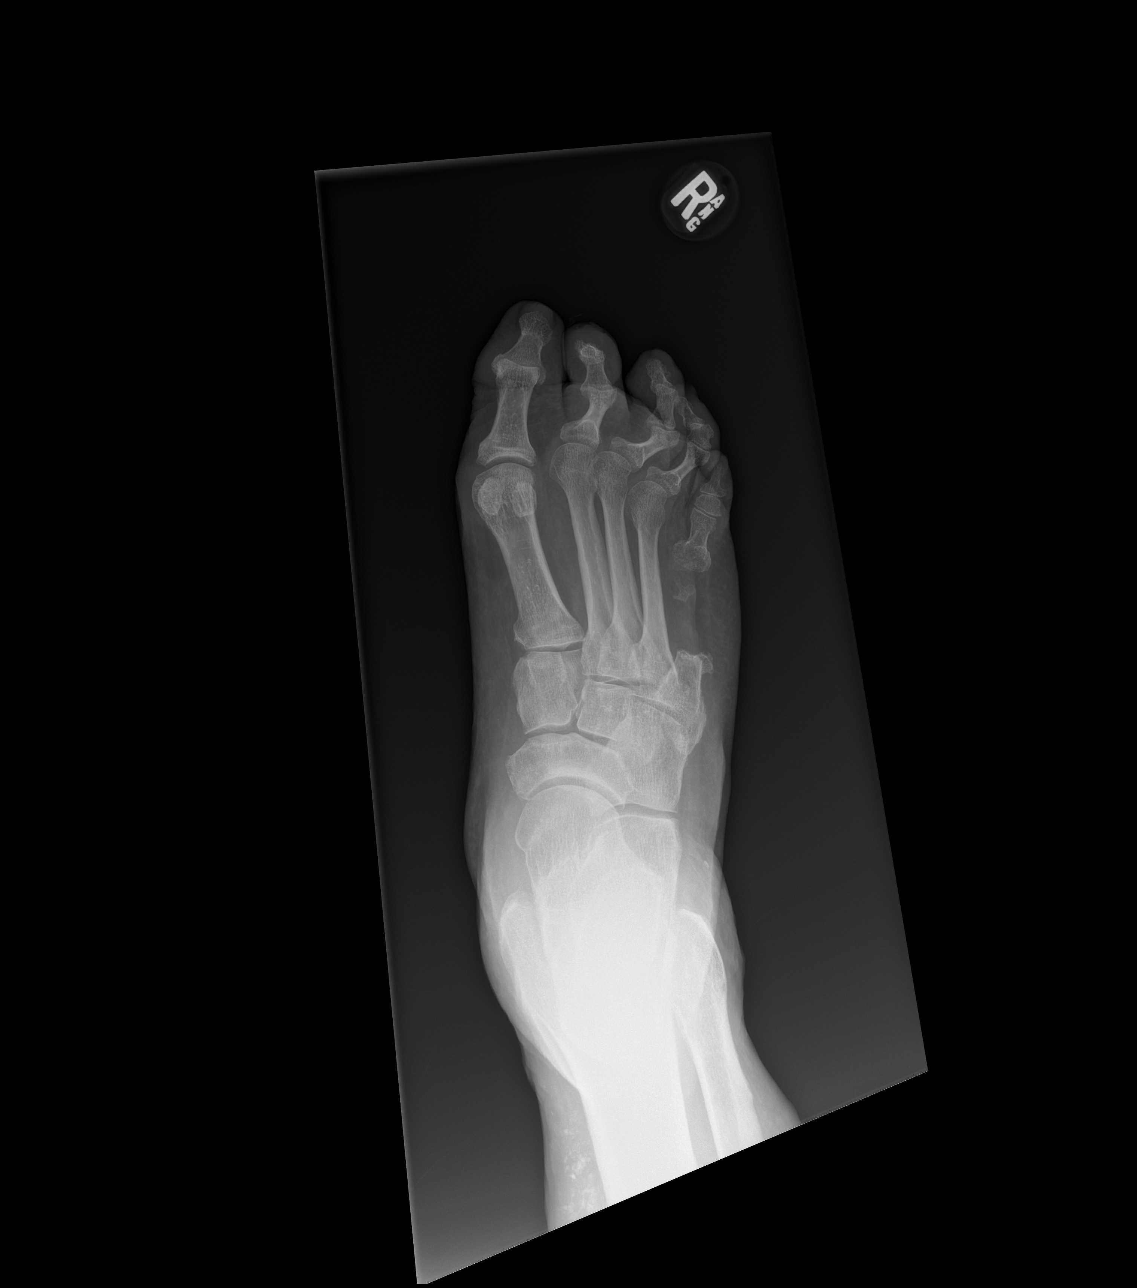

[x foot lat right (2 of 2)]
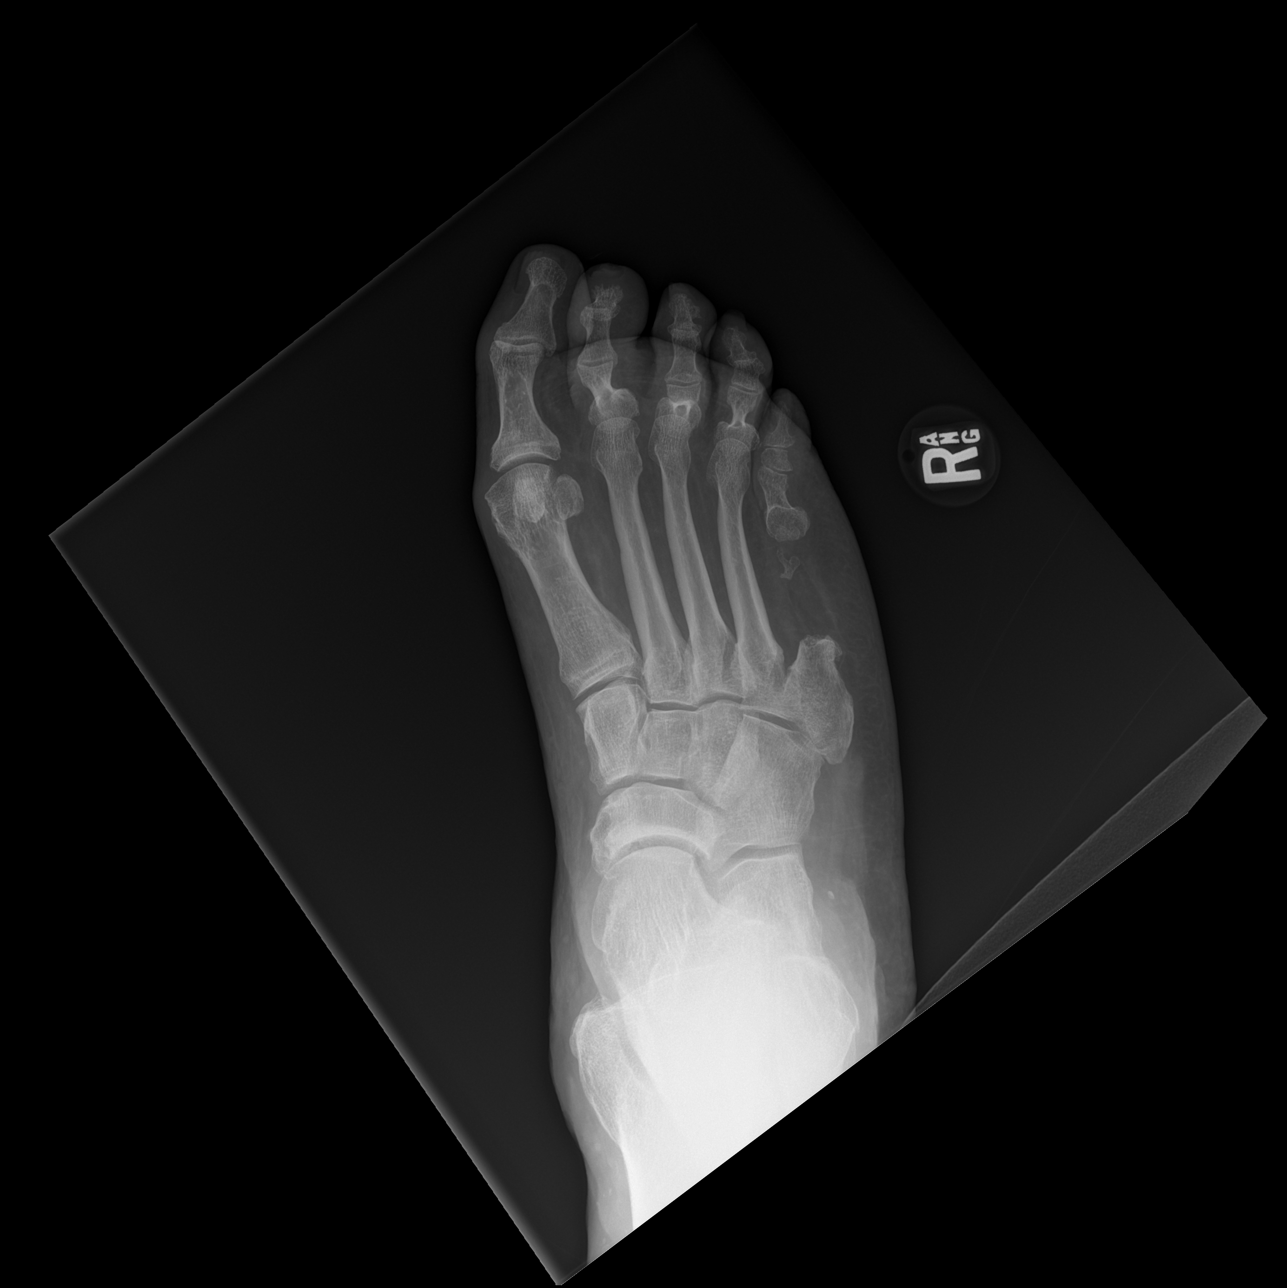

[3 of 3 positions shown; findings below may reference images not displayed]

FINDINGS: No fracture or dislocation of the right foot or or ankle.
Redemonstrated postoperative findings of partial right fifth
metatarsal resection. Mild osteoarthritic pattern arthrosis
throughout. Soft tissues are unremarkable.
IMPRESSION: No fracture or dislocation of the right foot or or ankle.

## 2019-04-21 IMAGING — CR DG ANKLE COMPLETE 3+V*R*
3 series · 3 of 3 positions shown · non-contrast
Comparison: [DATE]

CLINICAL DATA: Fall, pain

EXAM:
RIGHT FOOT COMPLETE - 3+ VIEW; RIGHT ANKLE - COMPLETE 3+ VIEW

[x ankle lat right]
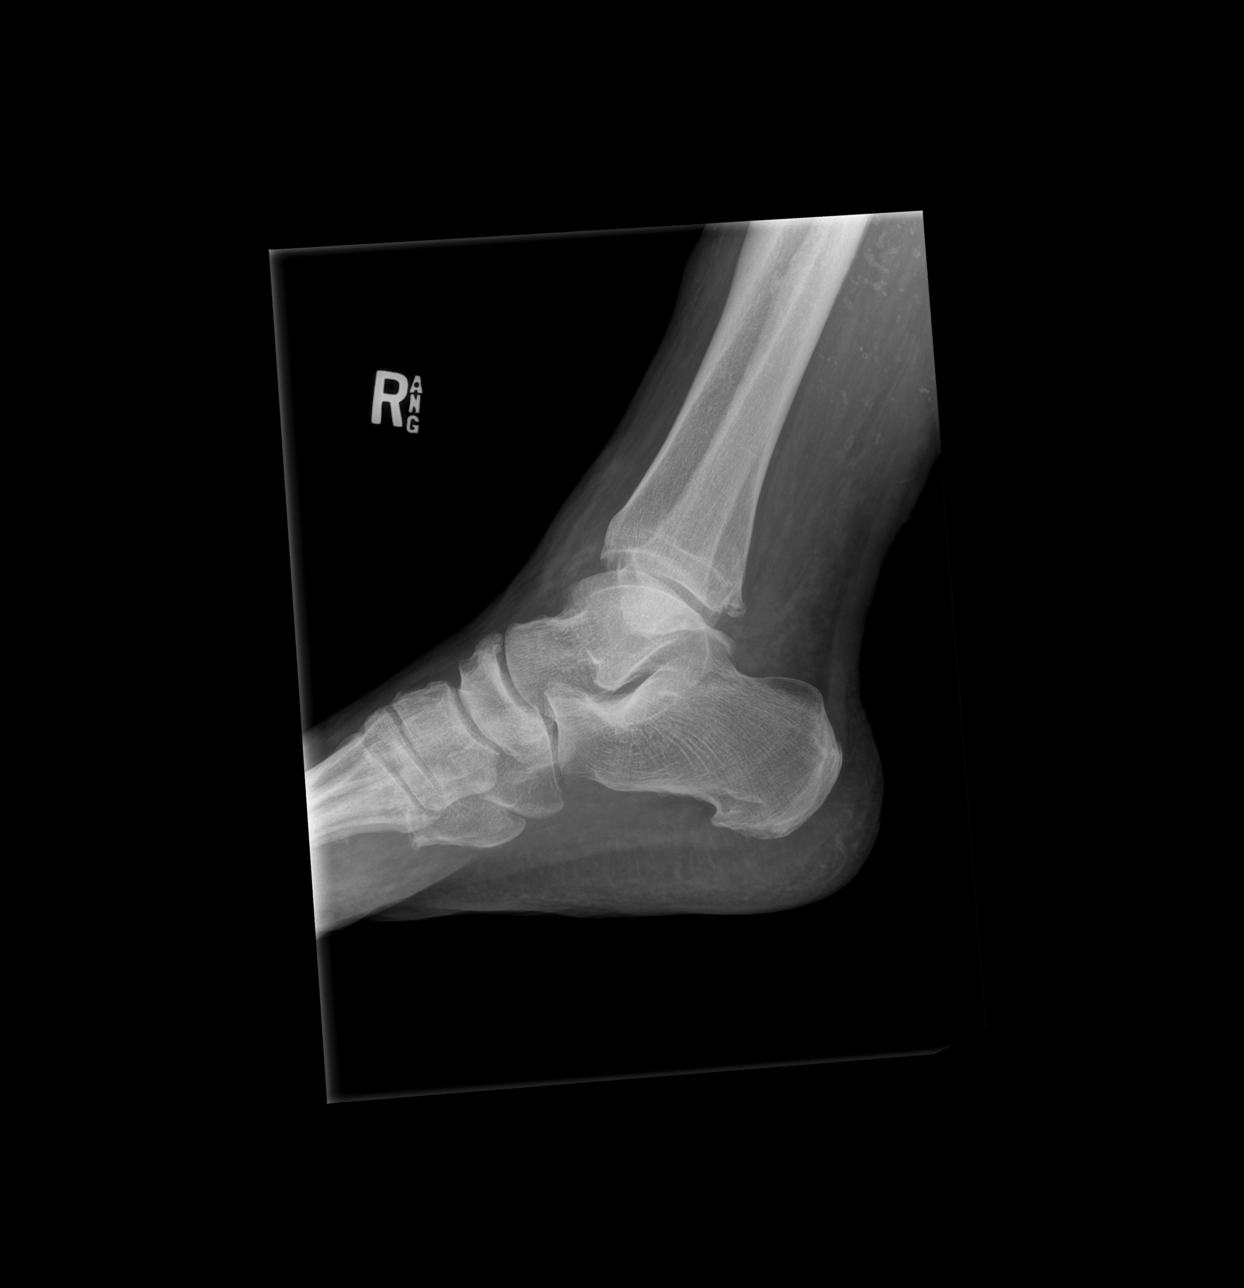

[x ankle obl right]
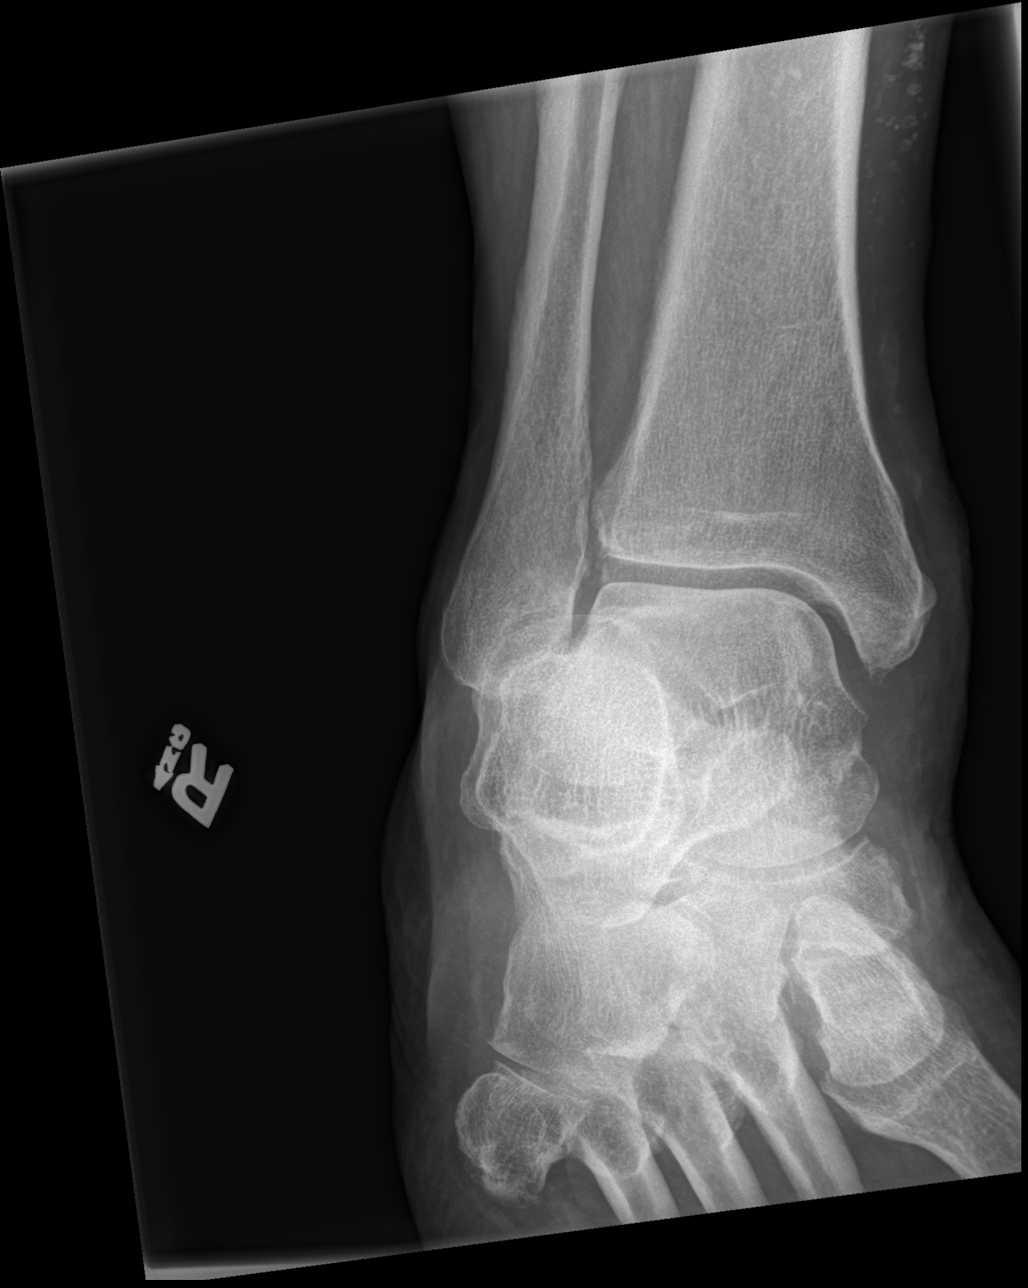

[x ankle ap right]
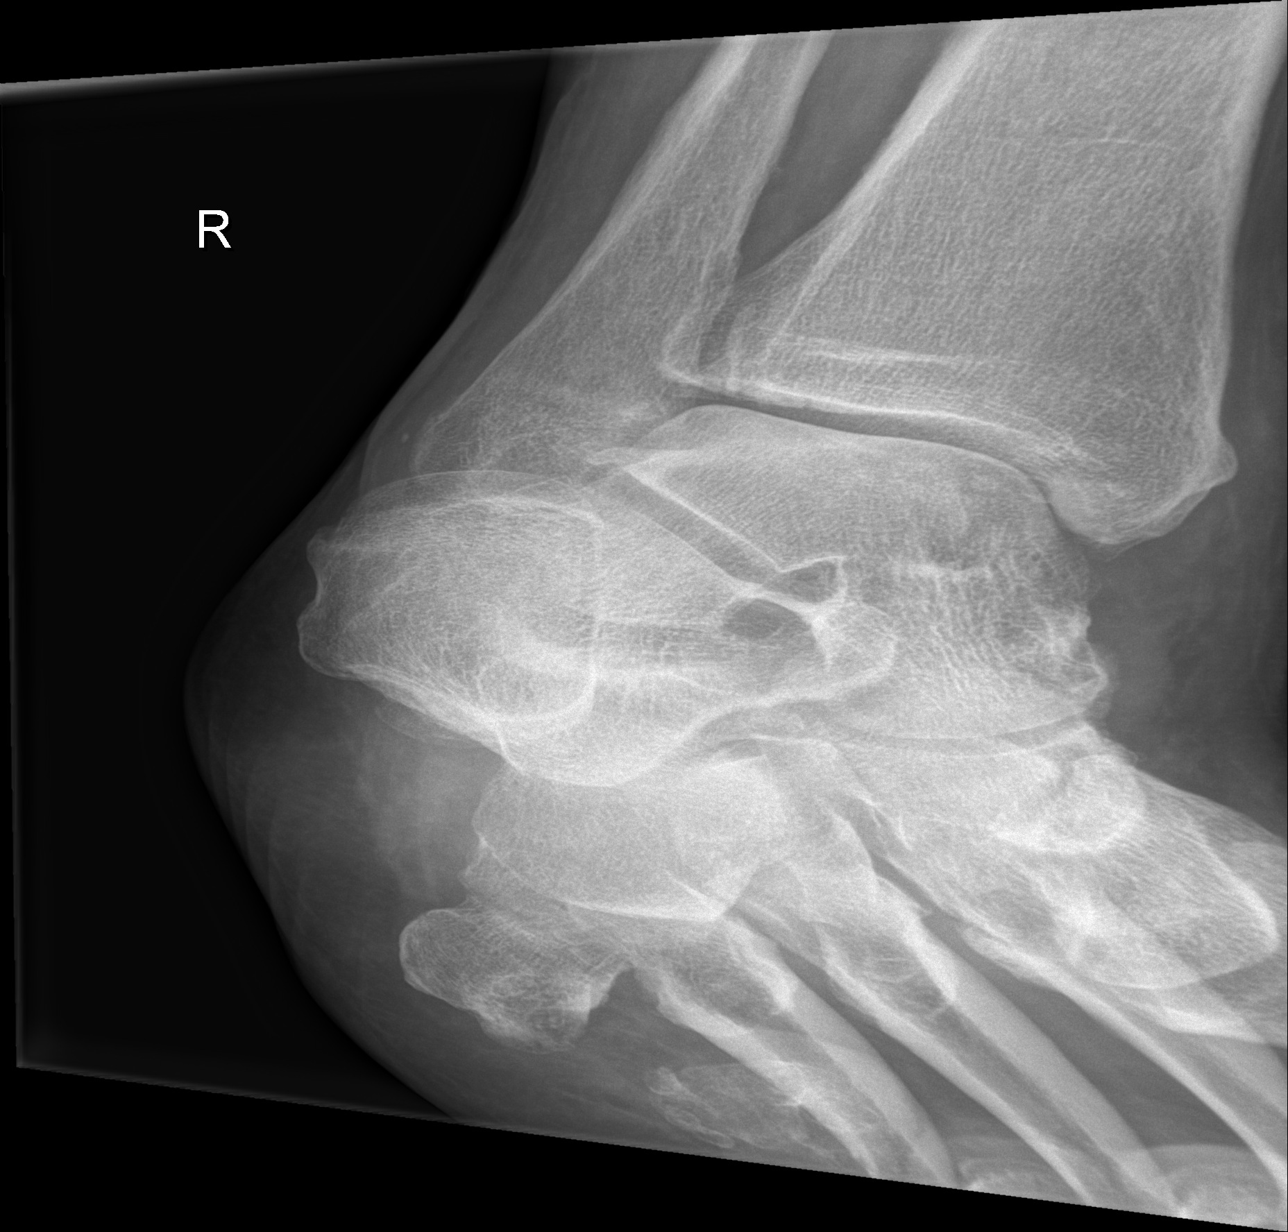

[3 of 3 positions shown; findings below may reference images not displayed]

FINDINGS: No fracture or dislocation of the right foot or or ankle.
Redemonstrated postoperative findings of partial right fifth
metatarsal resection. Mild osteoarthritic pattern arthrosis
throughout. Soft tissues are unremarkable.
IMPRESSION: No fracture or dislocation of the right foot or or ankle.

## 2019-04-21 IMAGING — CR DG FEMUR 2+V*R*
5 series · 5 of 5 positions shown · non-contrast
Comparison: [DATE]

CLINICAL DATA: Persistent pain after fall

EXAM:
RIGHT FEMUR 2 VIEWS

[x femur proximal ap right (1 of 2)]
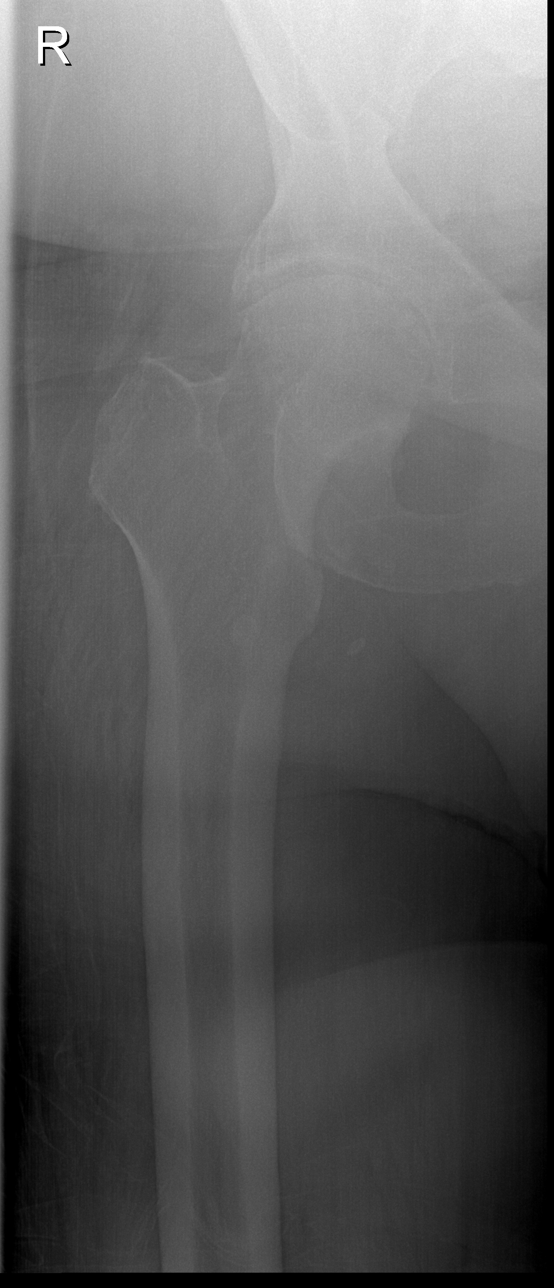

[x femur proximal ap right (2 of 2)]
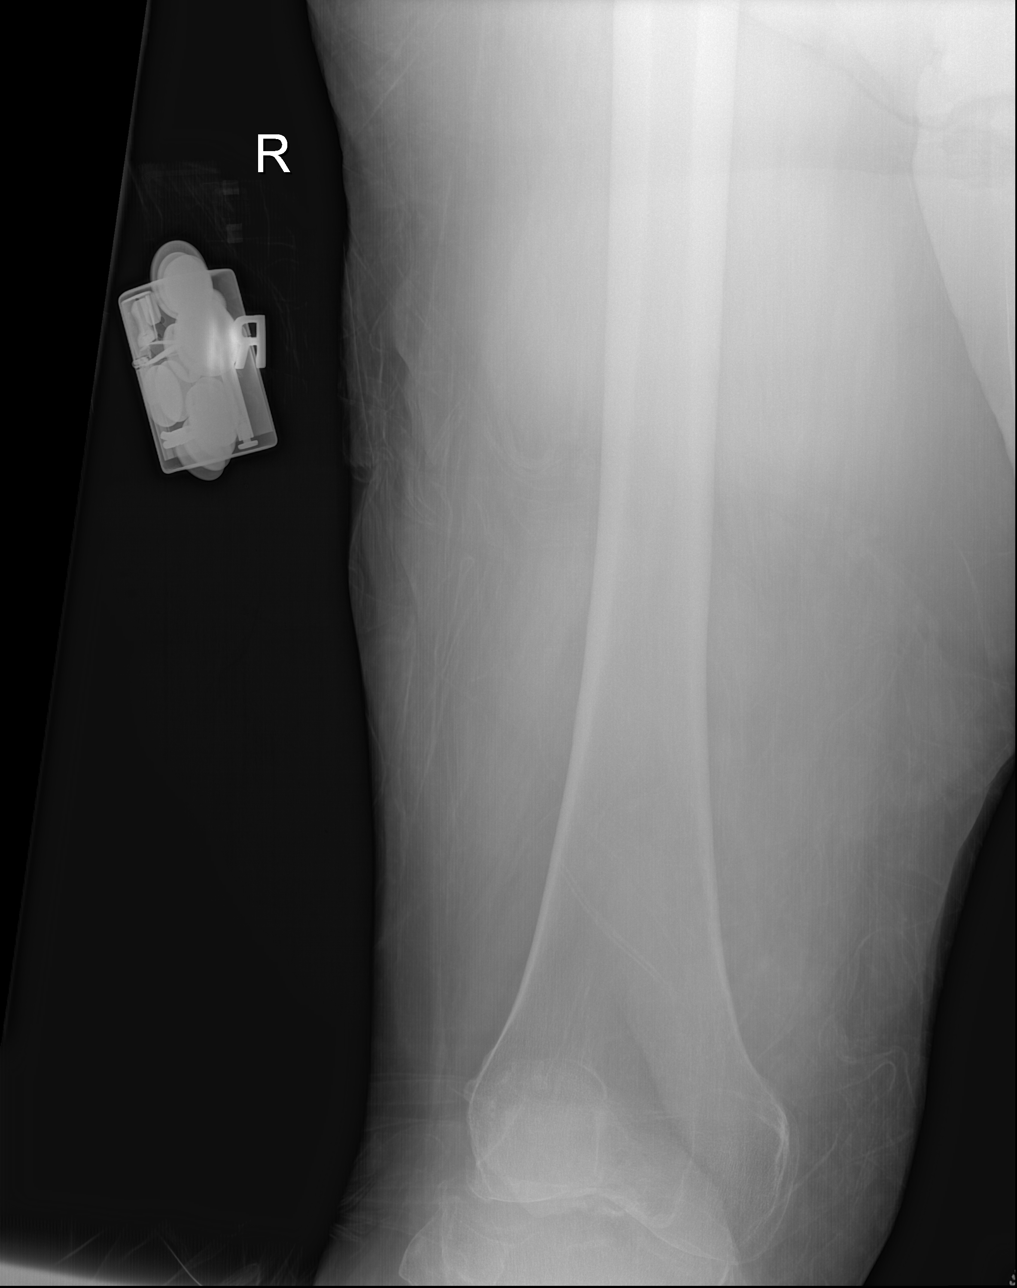

[w femur distal lat right (1 of 3)]
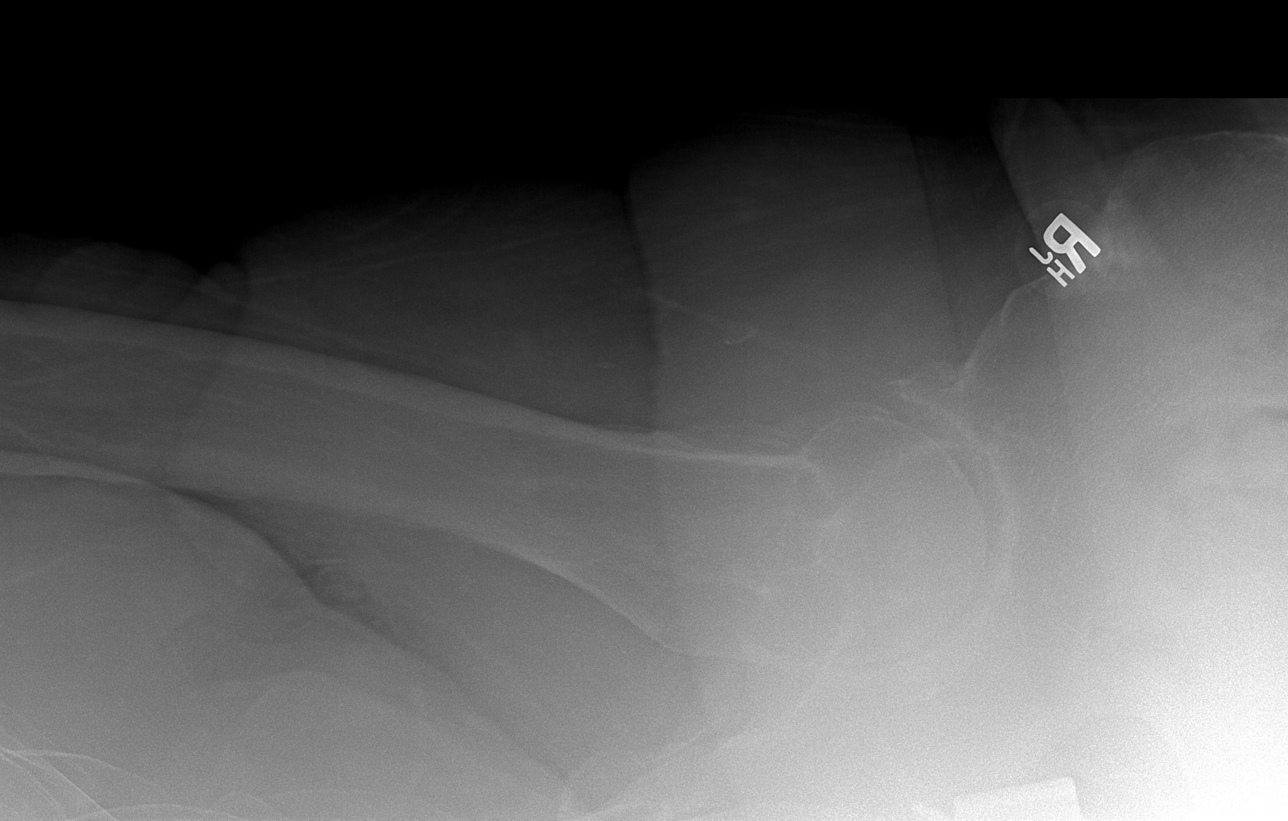

[w femur distal lat right (2 of 3)]
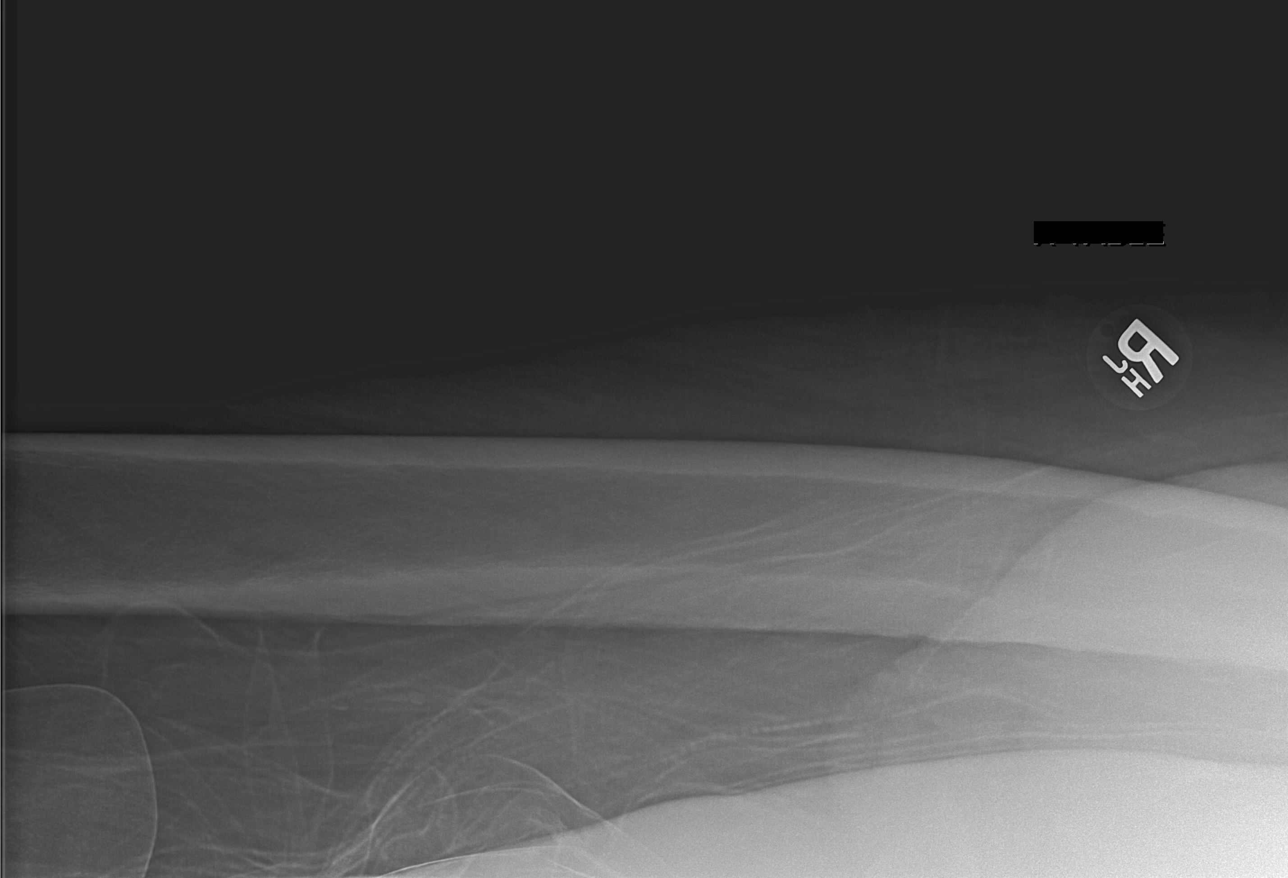

[w femur distal lat right (3 of 3)]
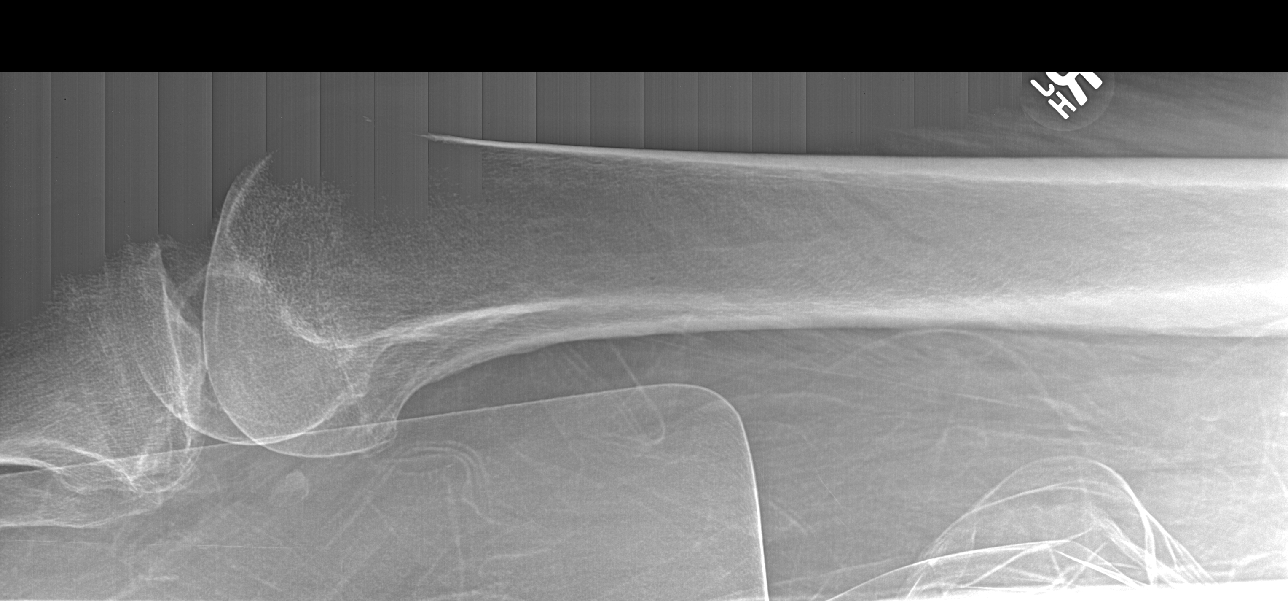

[5 of 5 positions shown; findings below may reference images not displayed]

FINDINGS: Moderate arthritis right hip. No definitive displaced fracture.
Distal femur evaluation limited by technical artifact. Lucency at
the posterior cortex of the femur shaft favor nutrient foramen.
IMPRESSION: 1. Limited evaluation of distal femur lateral view secondary to
technique. An oblique lucency at the posterior cortex of the shaft
of the femur is to represent nutrient foramen over fracture.

## 2019-04-21 IMAGING — CR DG HIP (WITH OR WITHOUT PELVIS) 2-3V*R*
3 series · 3 of 3 positions shown · non-contrast
Comparison: None.

CLINICAL DATA: Fall, pain

EXAM:
DG HIP (WITH OR WITHOUT PELVIS) 2-3V RIGHT

[t pelvis ap]
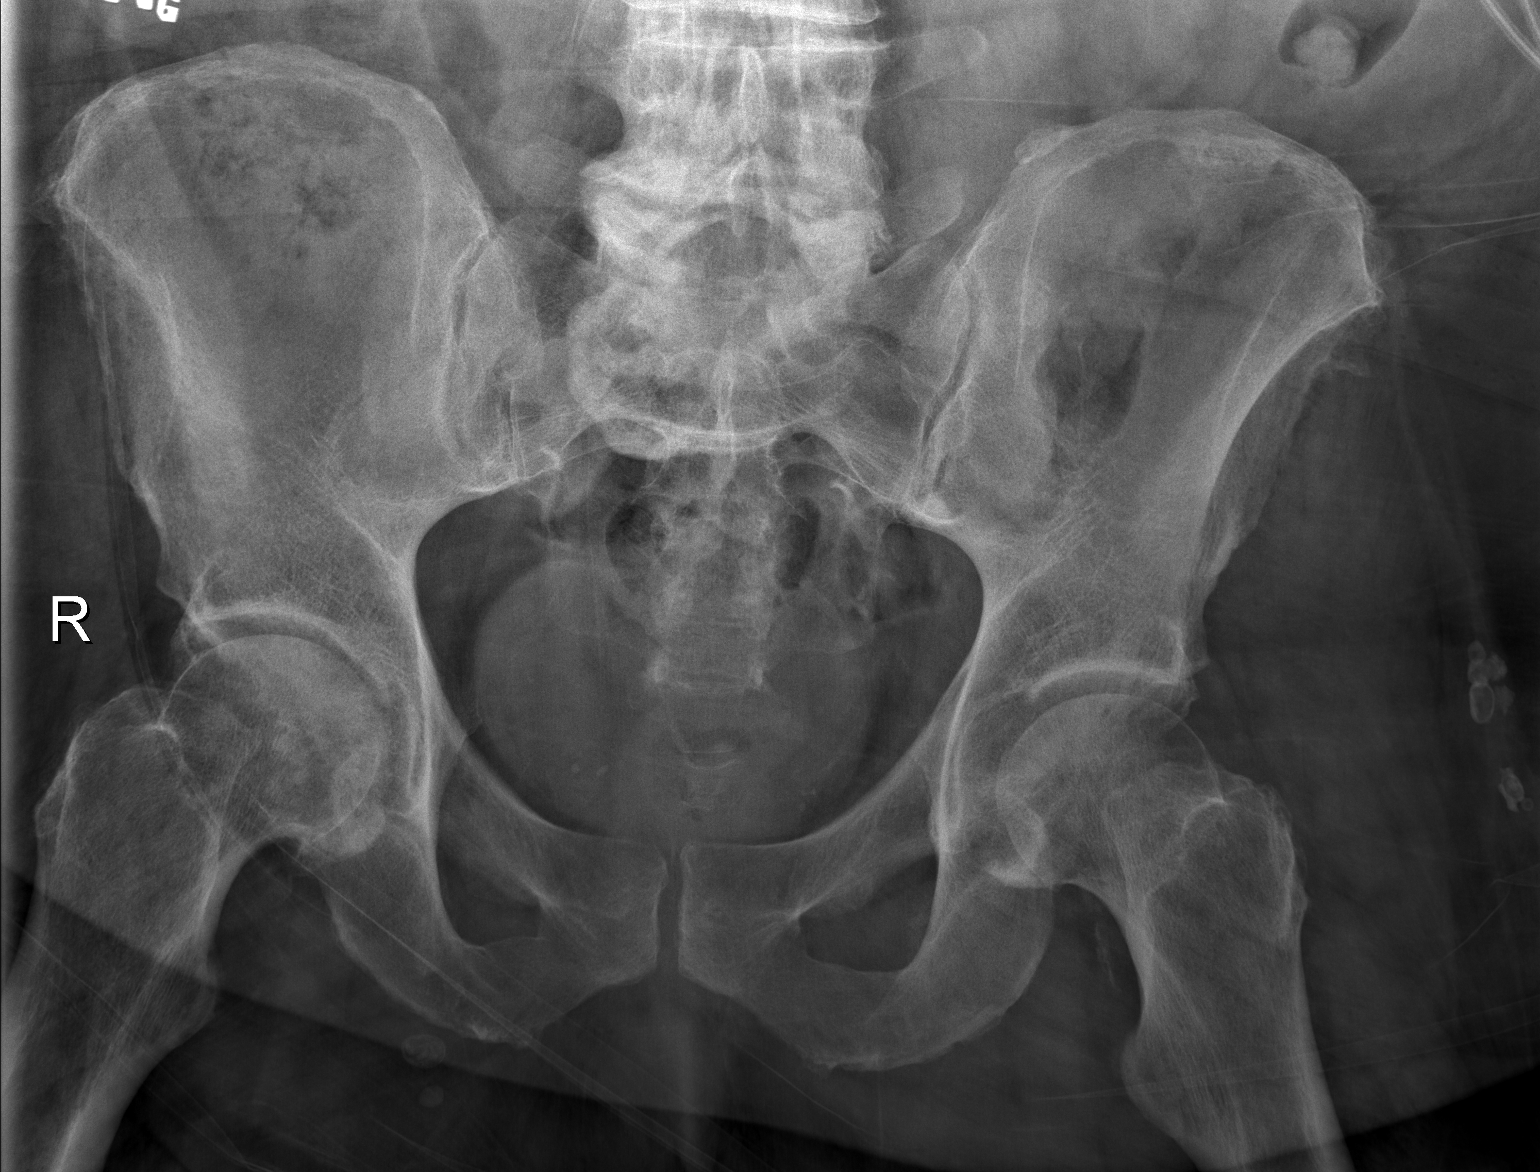

[t hip frog leg right (1 of 2)]
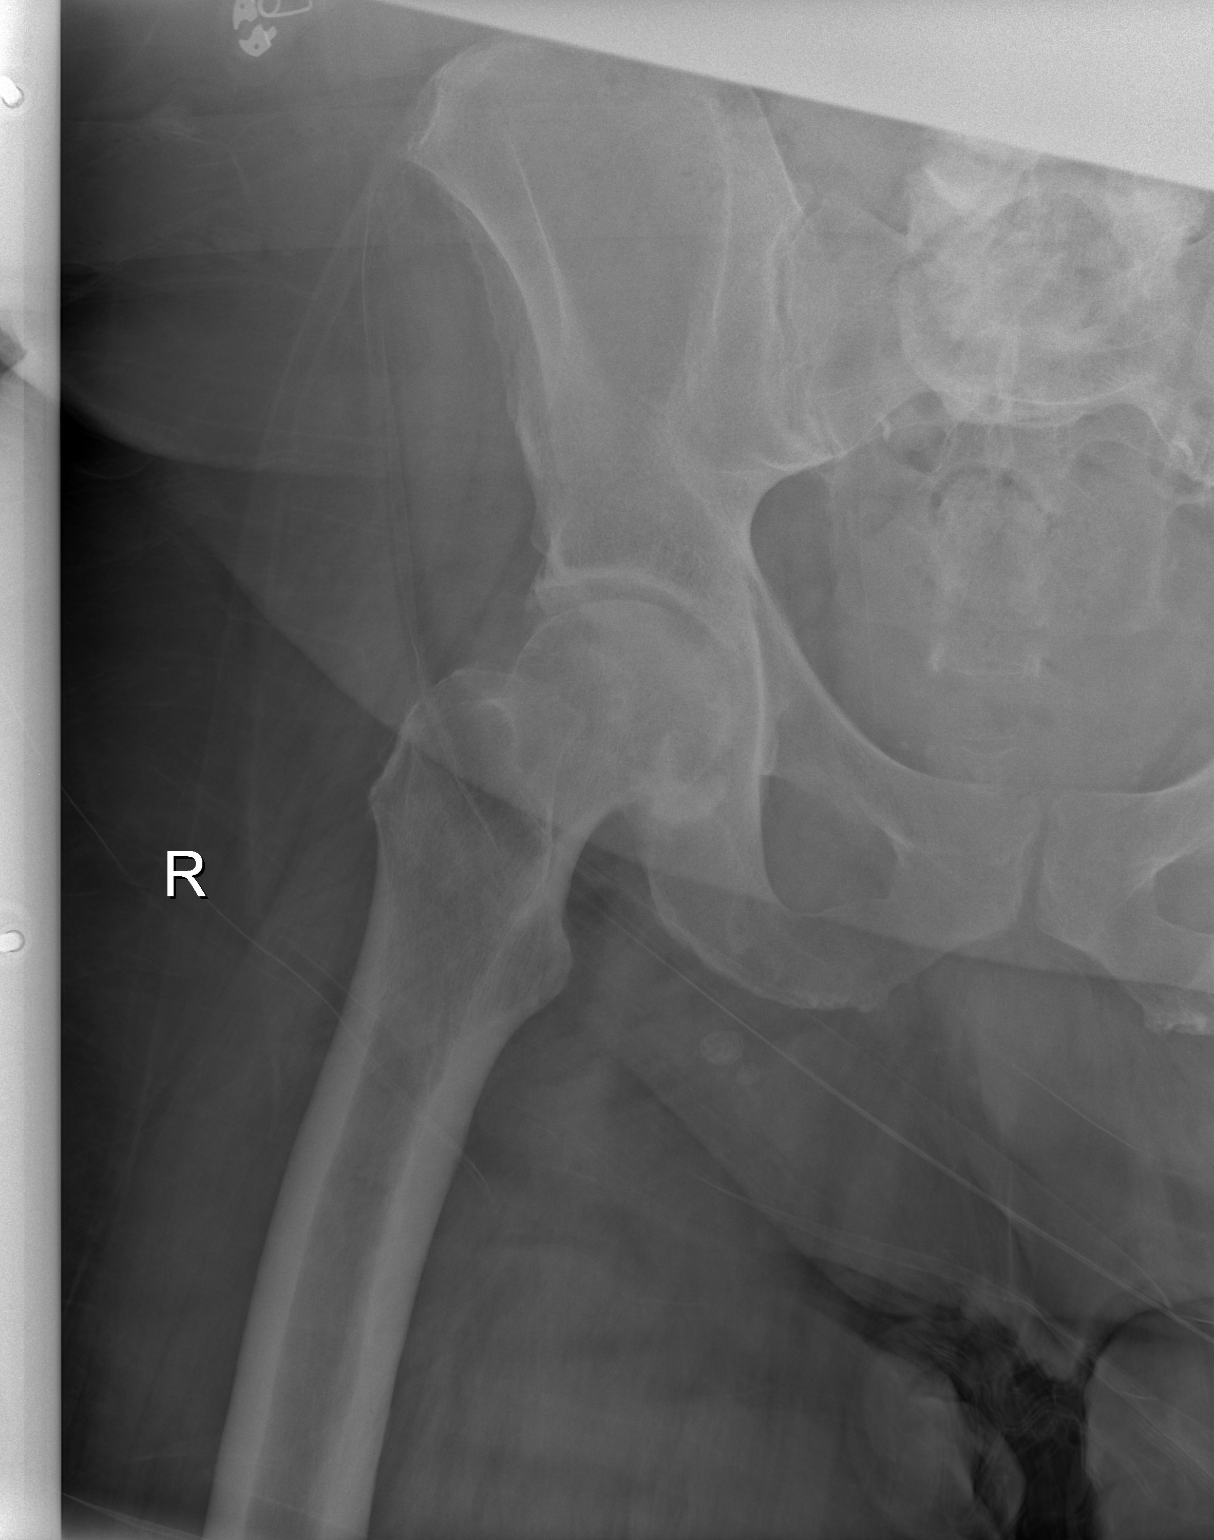

[t hip frog leg right (2 of 2)]
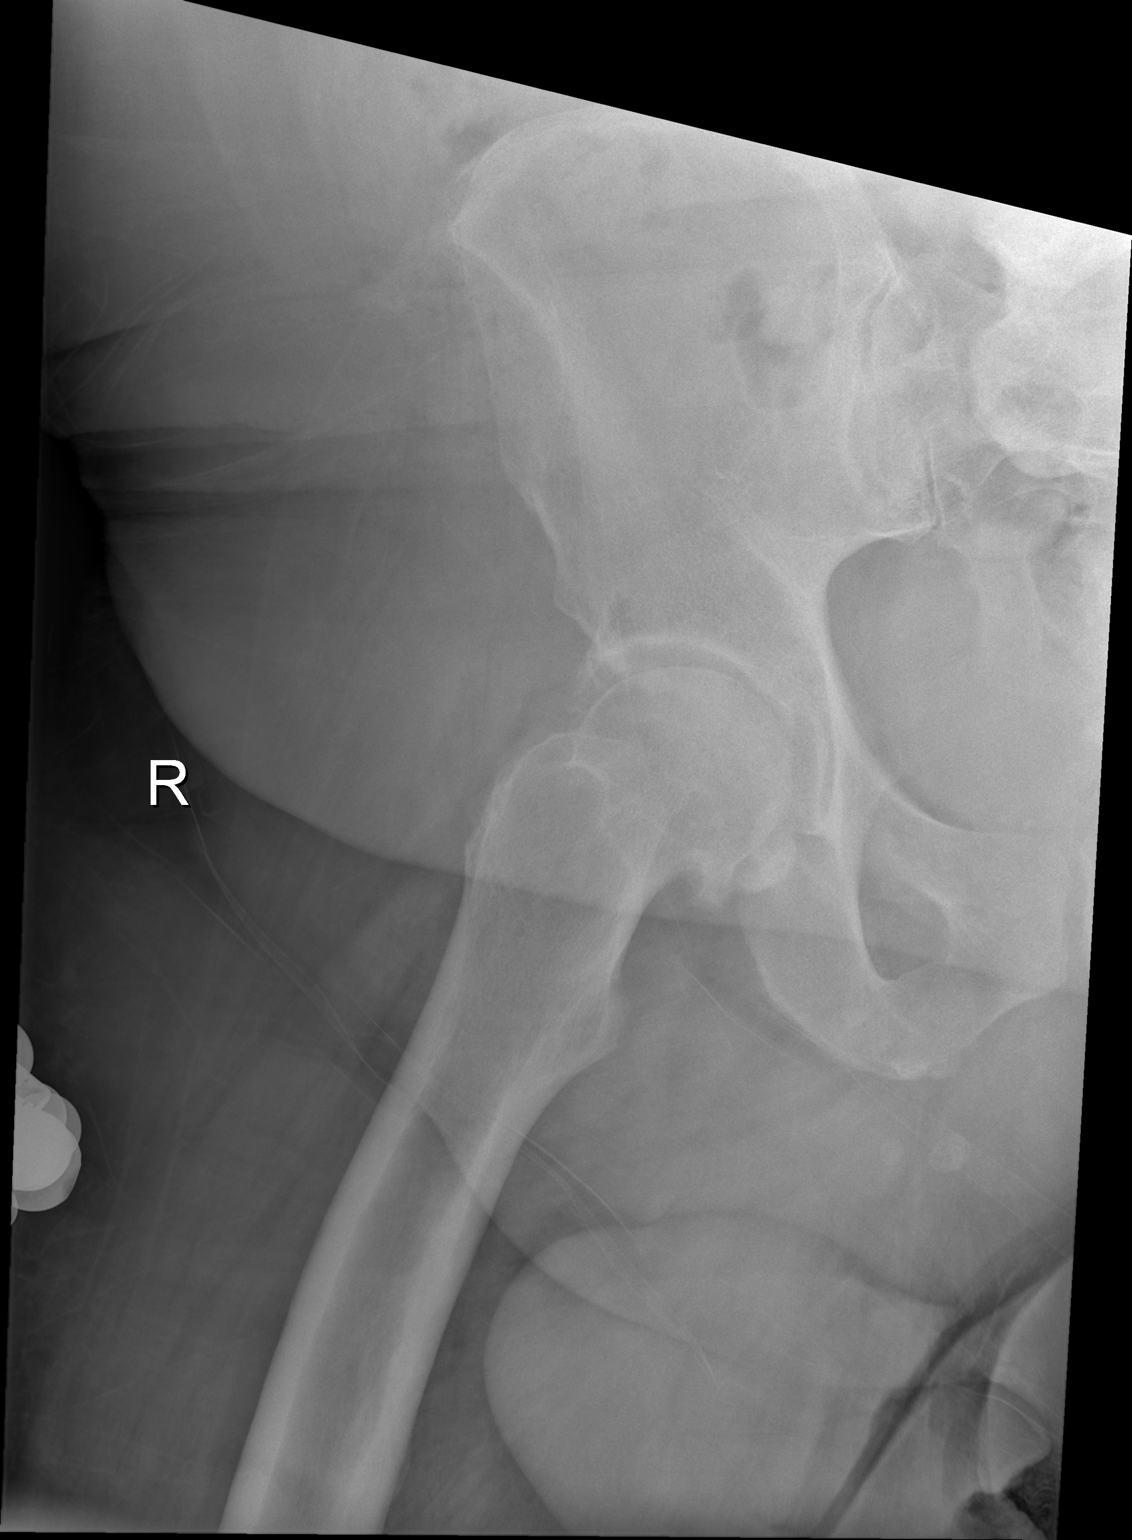

[3 of 3 positions shown; findings below may reference images not displayed]

FINDINGS: No fracture or dislocation of the right hip. Mild superior joint
space narrowing and osteophytosis. The pelvis and proximal left
femur are unremarkable in frontal view only. Nonobstructive pattern
of overlying bowel gas.
IMPRESSION: No fracture or dislocation of the right hip. Mild superior joint
space narrowing and osteophytosis. Please note that plain
radiographs are insensitive for hip and pelvic fracture.

## 2019-04-21 IMAGING — CT CT HEAD W/O CM
3 series · 15 of 47 positions shown, 18 images · non-contrast
Comparison: [DATE]

CLINICAL DATA: Fall

EXAM:
CT HEAD WITHOUT CONTRAST
CT CERVICAL SPINE WITHOUT CONTRAST
TECHNIQUE: Multidetector CT imaging of the head and cervical spine was
performed following the standard protocol without intravenous
contrast. Multiplanar CT image reconstructions of the cervical spine
were also generated.

[Series 2: head wo · axial · 0.51mm/px · z∈[-383,-218]mm · 9 of 39 slices shown, 12 images]
[im 3/39  brain]
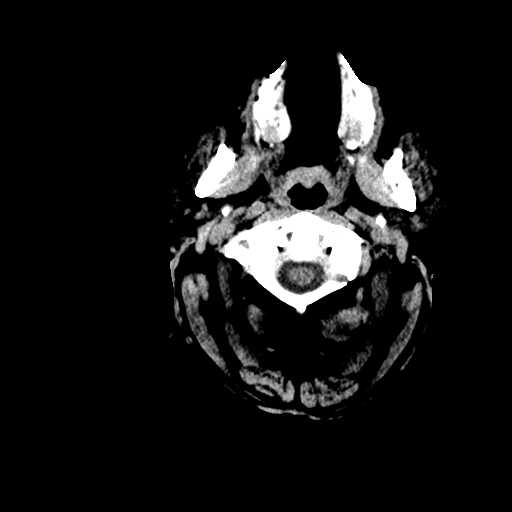
[im 3/39  bone]
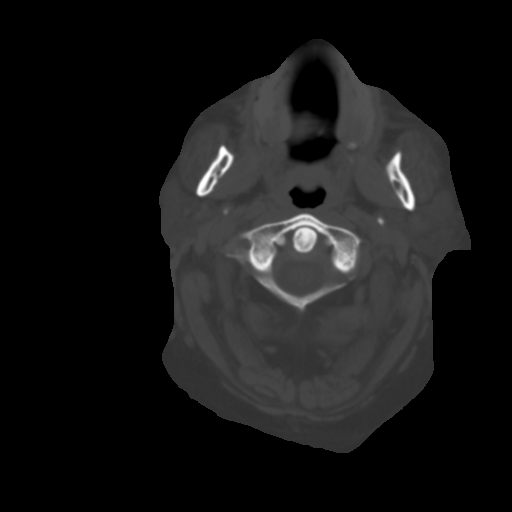
[im 7/39  brain]
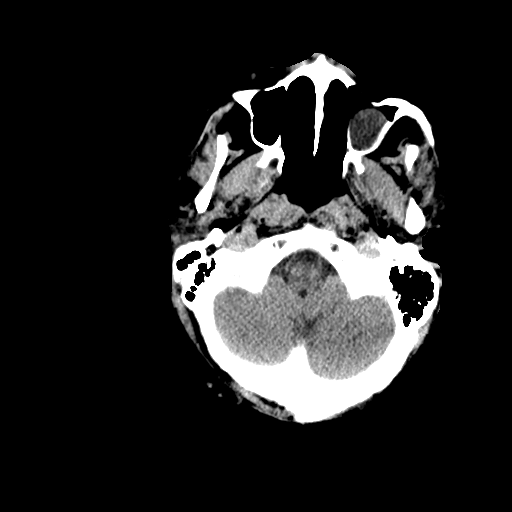
[im 11/39  brain]
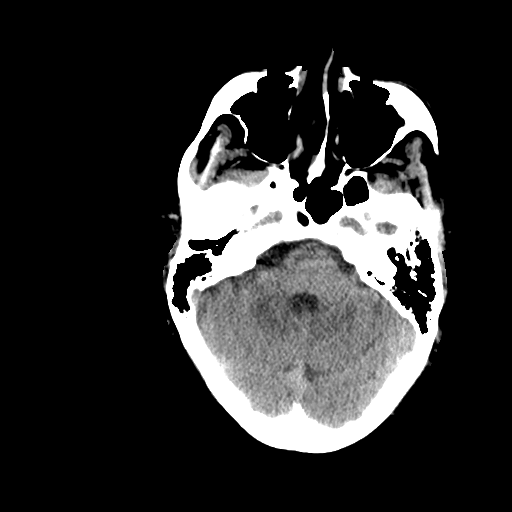
[im 15/39  brain]
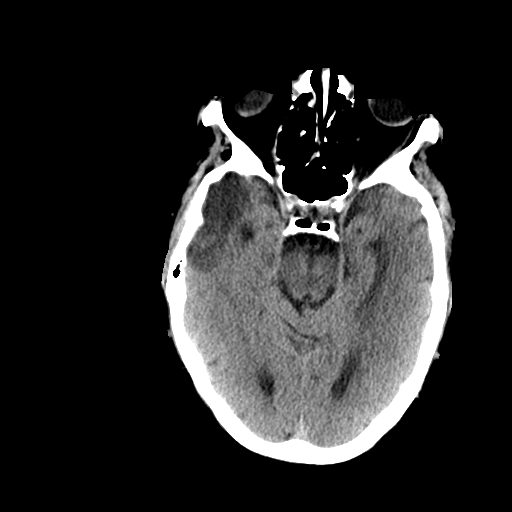
[im 20/39  brain]
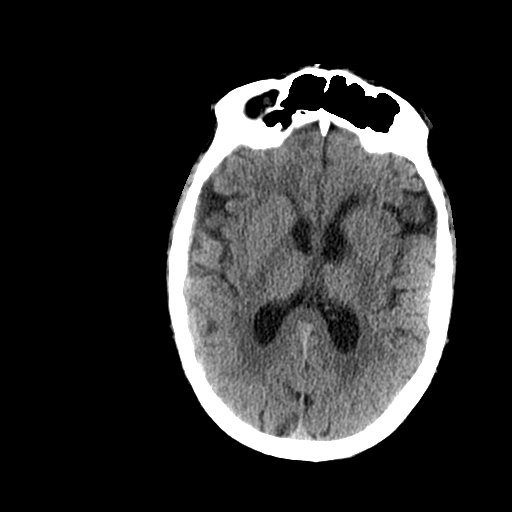
[im 20/39  bone]
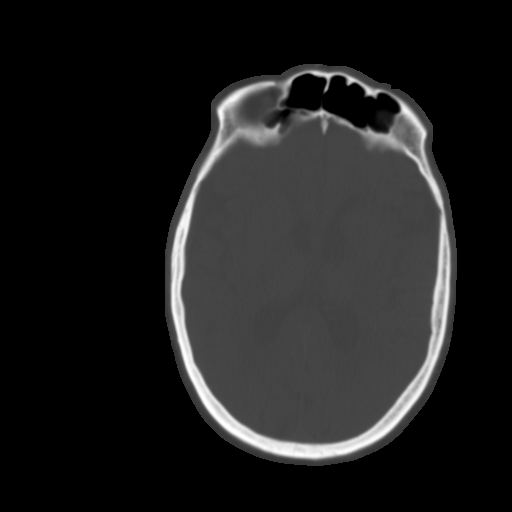
[im 24/39  brain]
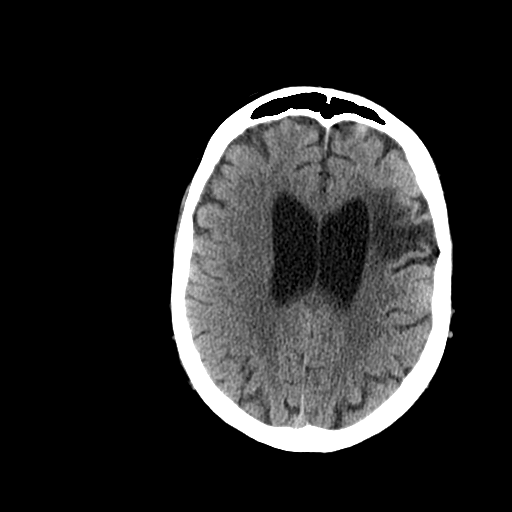
[im 28/39  brain]
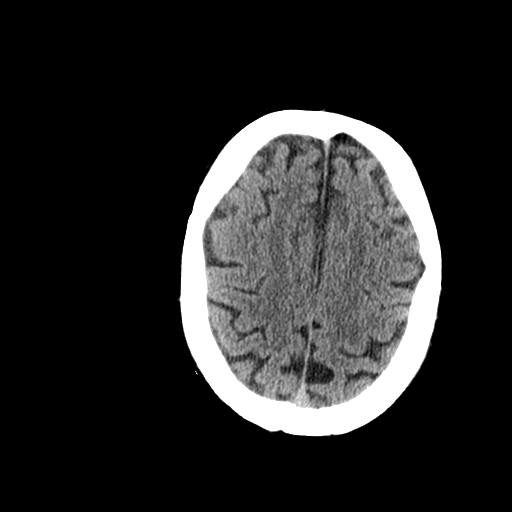
[im 32/39  brain]
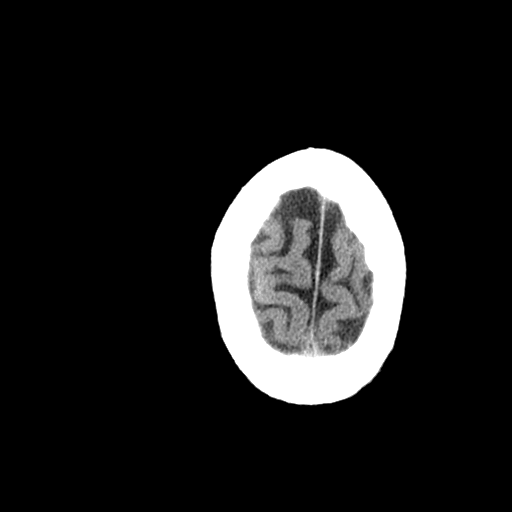
[im 36/39  brain]
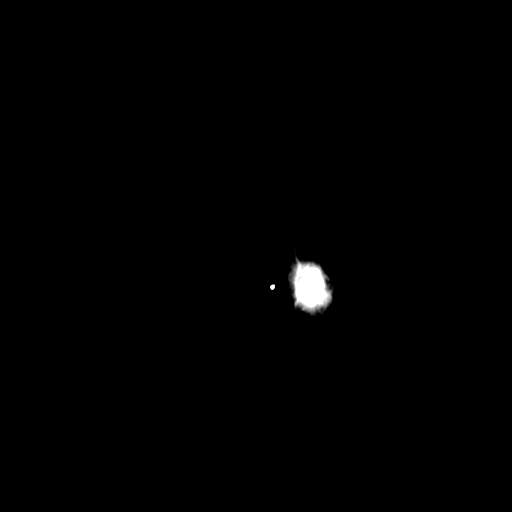
[im 36/39  bone]
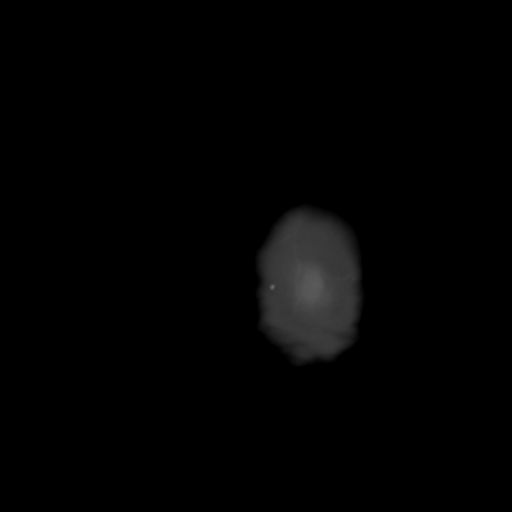

[Series 4: coronal soft tissue · coronal · 0.38mm/px · 3 of 84 slices shown]
[im 28/84  brain]
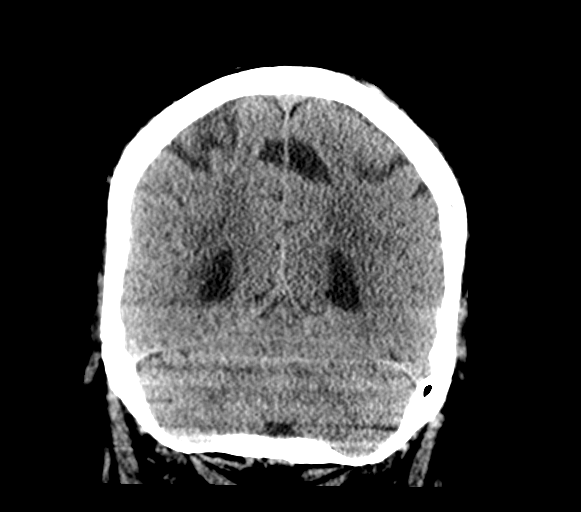
[im 37/84  brain]
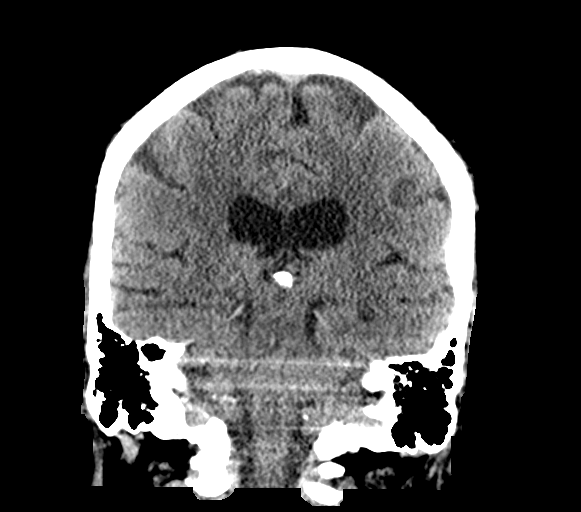
[im 47/84  brain]
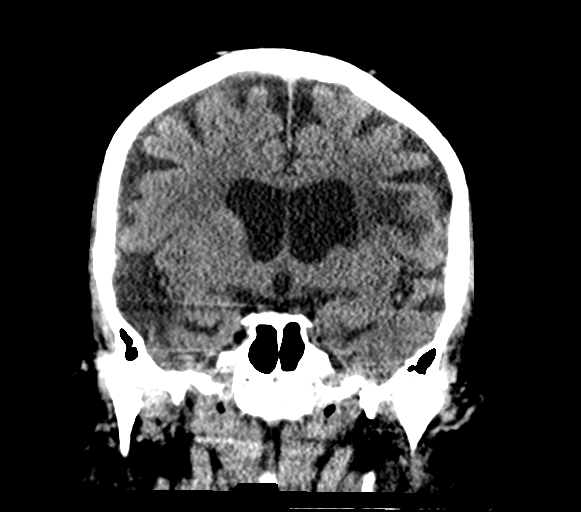

[Series 5: sagittal soft tissue · sagittal · 0.38mm/px · 3 of 63 slices shown]
[im 21/63  brain]
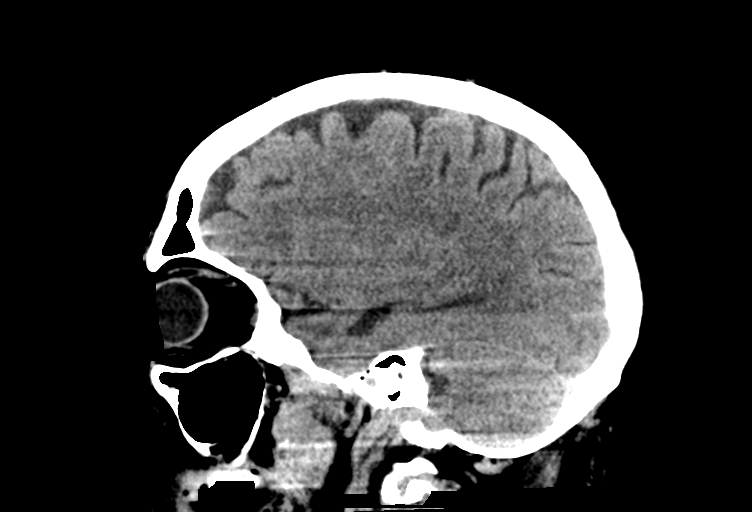
[im 32/63  brain]
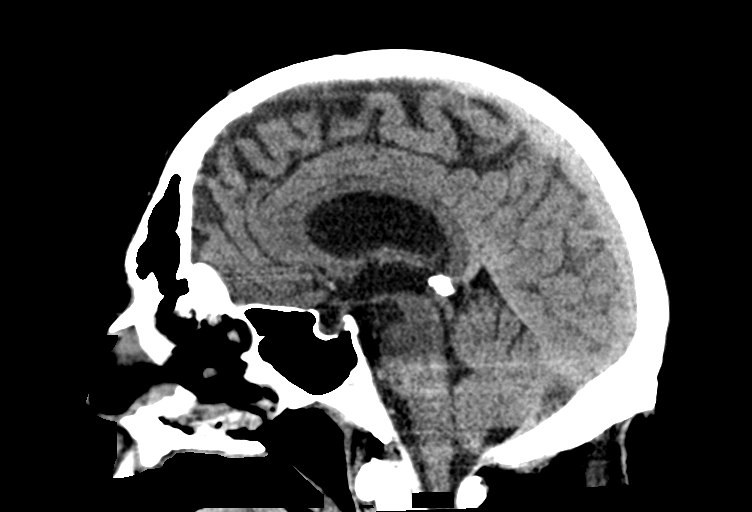
[im 42/63  brain]
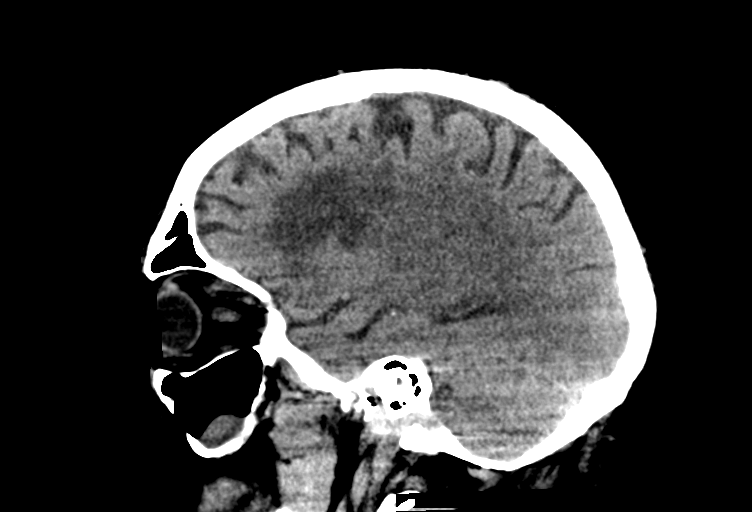

[15 of 47 positions shown; findings below may reference images not displayed]

FINDINGS: CT HEAD FINDINGS

Brain: No evidence of acute infarction, hemorrhage, hydrocephalus,
extra-axial collection or mass lesion/mass effect. Periventricular
white matter hypodensity and unchanged left frontal and right
temporal MCA territory encephalomalacia bilaterally.

Vascular: No hyperdense vessel or unexpected calcification.

Skull: Normal. Negative for fracture or focal lesion.

Sinuses/Orbits: No acute finding.

Other: None.

CT CERVICAL SPINE FINDINGS

Alignment: Normal.

Skull base and vertebrae: No acute fracture. No primary bone lesion
or focal pathologic process.

Soft tissues and spinal canal: No prevertebral fluid or swelling. No
visible canal hematoma.

Disc levels: Moderate multilevel disc space height loss and
osteophytosis.

Upper chest: Negative.

Other: None.
IMPRESSION: 1. No acute intracranial pathology. Unchanged small-vessel white
matter disease and bilateral MCA territory encephalomalacia.

2. No fracture or static subluxation of the cervical spine. Moderate
multilevel disc degenerative disease and osteophytosis.

## 2019-04-21 IMAGING — CR DG TIBIA/FIBULA 2V*R*
4 series · 4 of 4 positions shown · non-contrast
Comparison: None.

CLINICAL DATA: Persistent pain after fall

EXAM:
RIGHT TIBIA AND FIBULA - 2 VIEW

[x tib-fib ap right (1 of 2)]
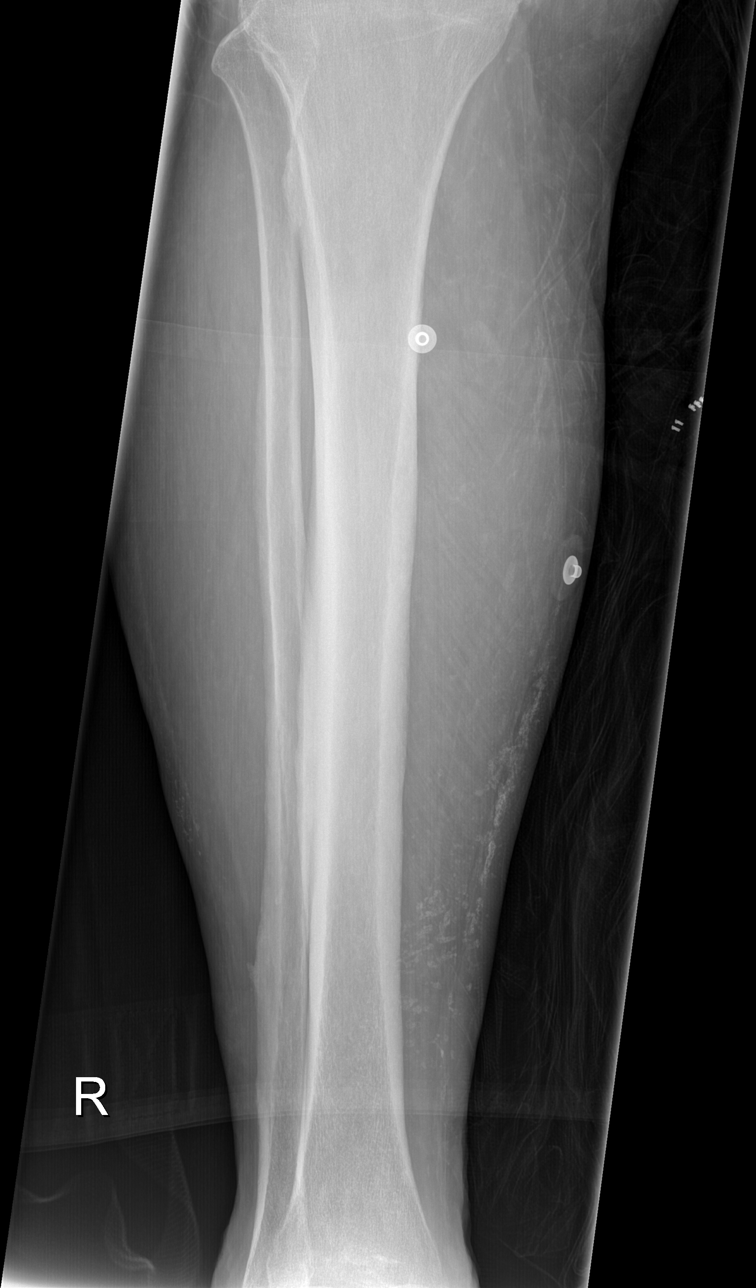

[x tib-fib ap right (2 of 2)]
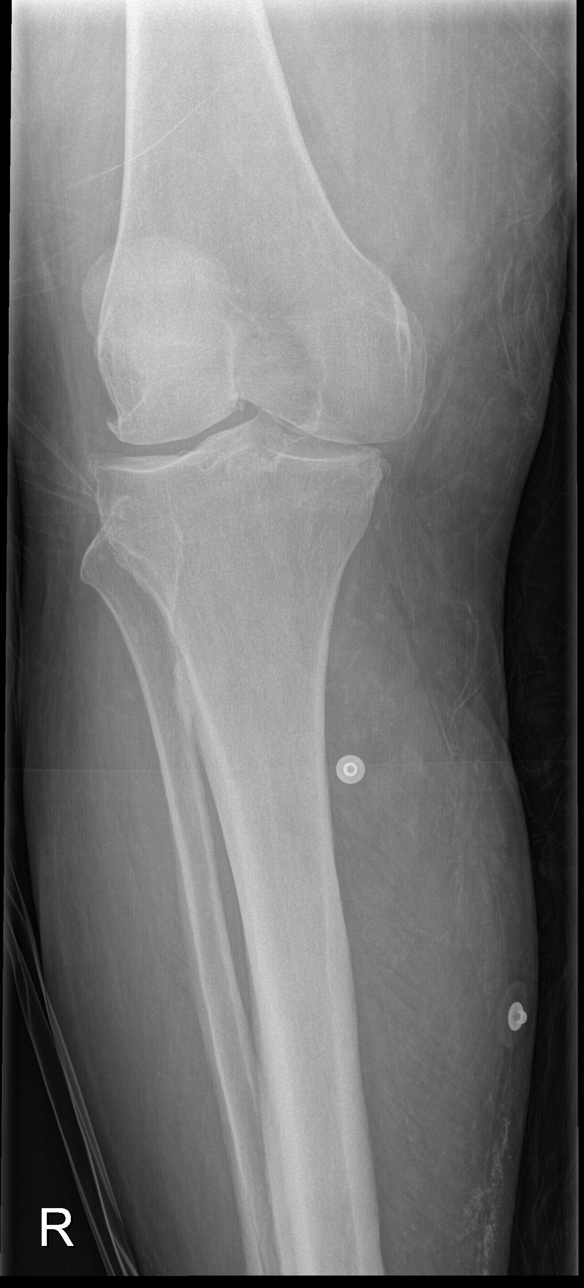

[x tib-fib lat right (1 of 2)]
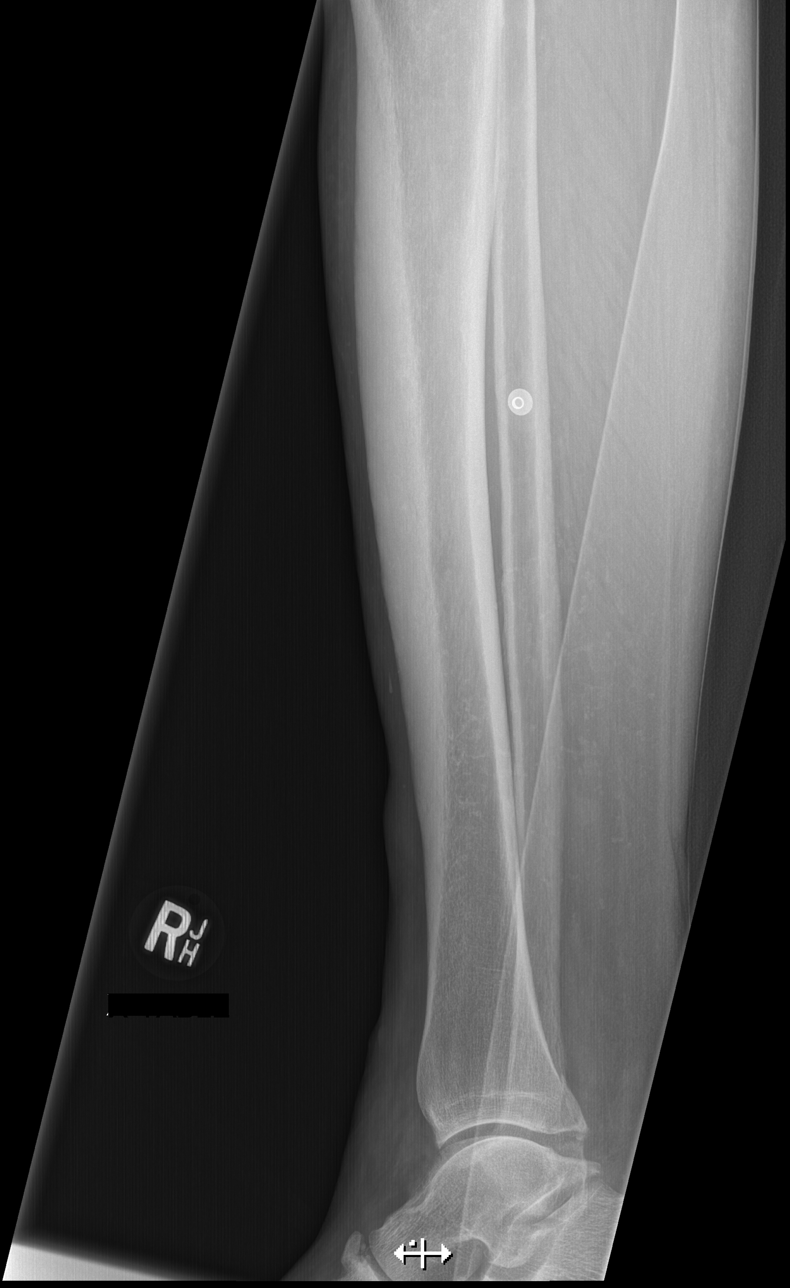

[x tib-fib lat right (2 of 2)]
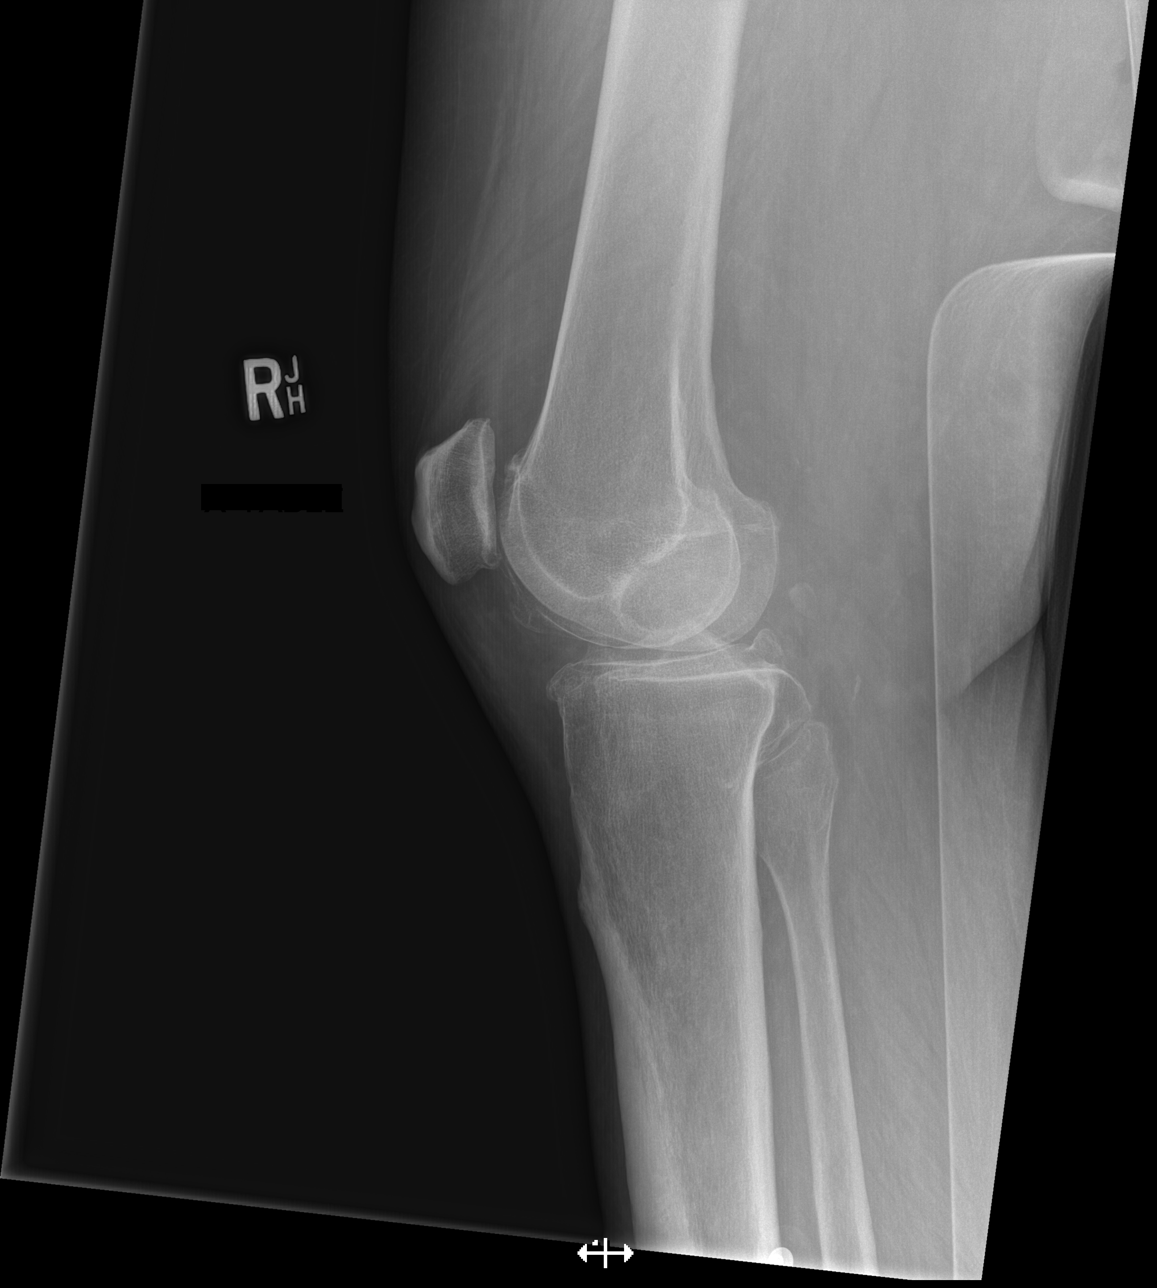

[4 of 4 positions shown; findings below may reference images not displayed]

FINDINGS: There is questionable step-off/fracture deformity at the posterior
malleolus of the distal tibia on the lateral view. Otherwise no
fracture or malalignment is seen.
IMPRESSION: Questionable lucency/deformity at the posterior malleolus of the
distal tibia. Correlate for point tenderness to the region

## 2019-04-21 IMAGING — CT CT CERVICAL SPINE W/O CM
4 of 8 series · 14 of 33 positions shown, 15 images · non-contrast
Comparison: [DATE]

CLINICAL DATA: Fall

EXAM:
CT HEAD WITHOUT CONTRAST
CT CERVICAL SPINE WITHOUT CONTRAST
TECHNIQUE: Multidetector CT imaging of the head and cervical spine was
performed following the standard protocol without intravenous
contrast. Multiplanar CT image reconstructions of the cervical spine
were also generated.

[Series 3: c spine soft · axial · 0.42mm/px · z∈[-498,-388]mm · 3 of 111 slices shown]
[im 28/111  soft-tissue]
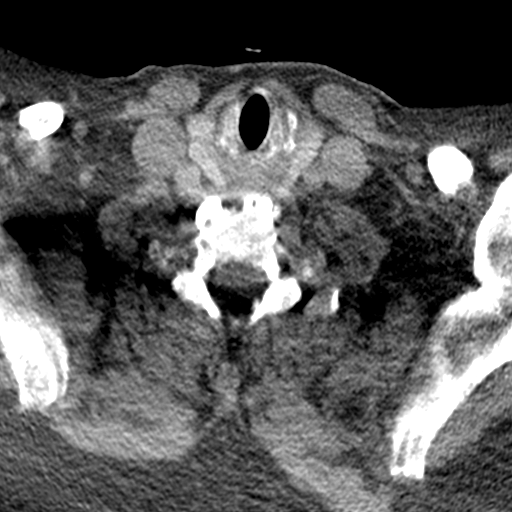
[im 56/111  soft-tissue]
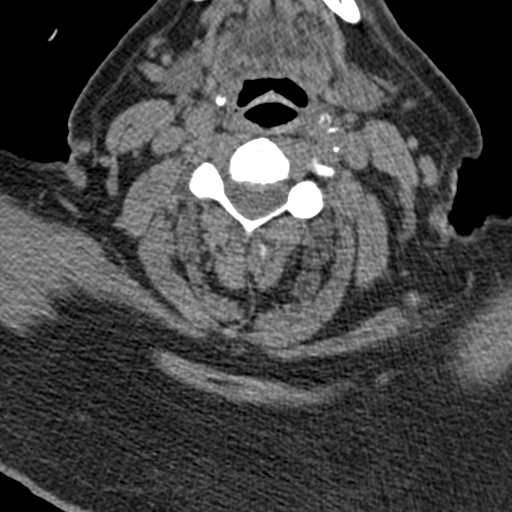
[im 83/111  soft-tissue]
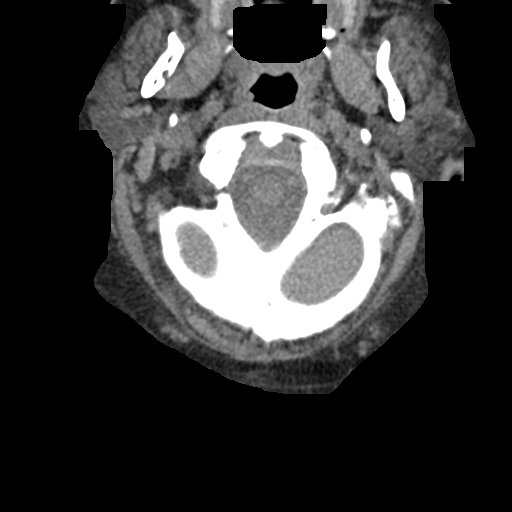

[Series 6: orthogonal bone · axial · 0.33mm/px · z∈[-528,-417]mm · 3 of 116 slices shown, 4 images]
[im 29/116  soft-tissue]
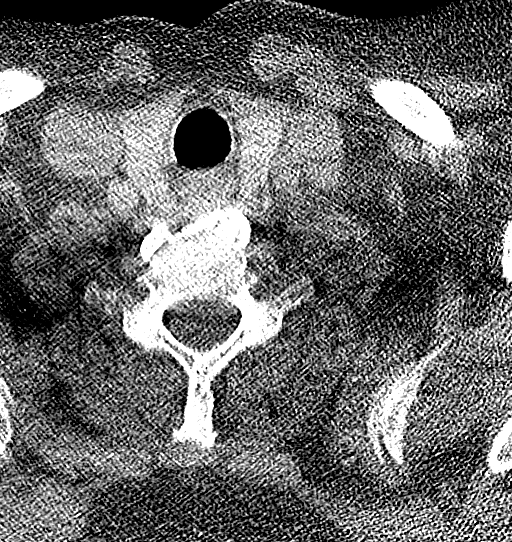
[im 29/116  bone]
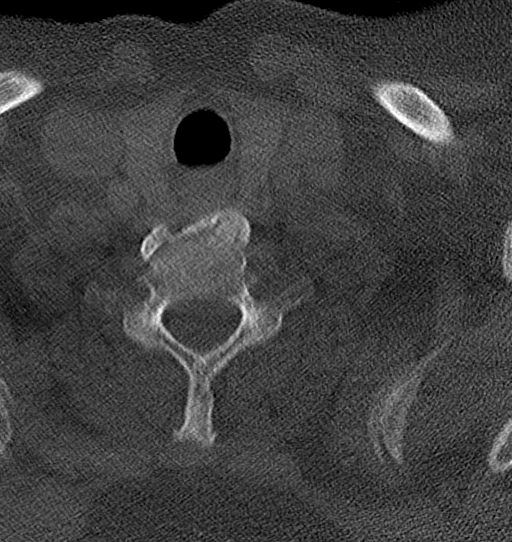
[im 58/116  bone]
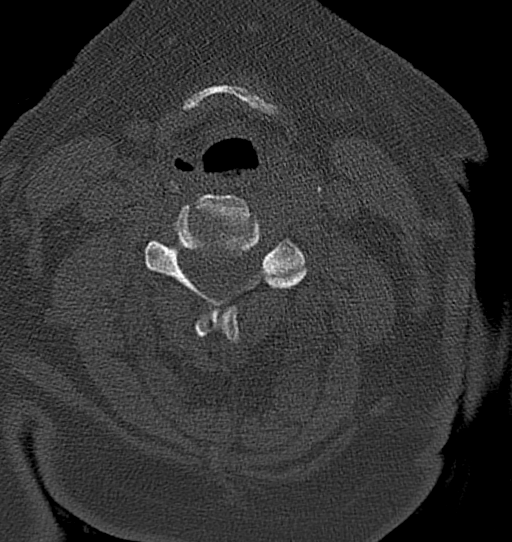
[im 87/116  bone]
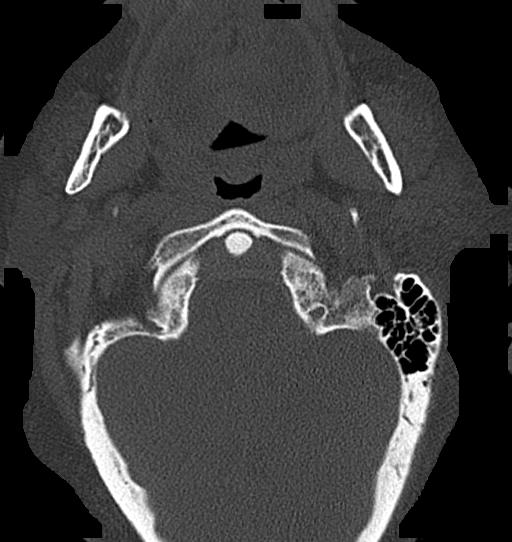

[Series 8: sagittal bone · sagittal · 0.37mm/px · 5 of 61 slices shown]
[im 11/61  bone]
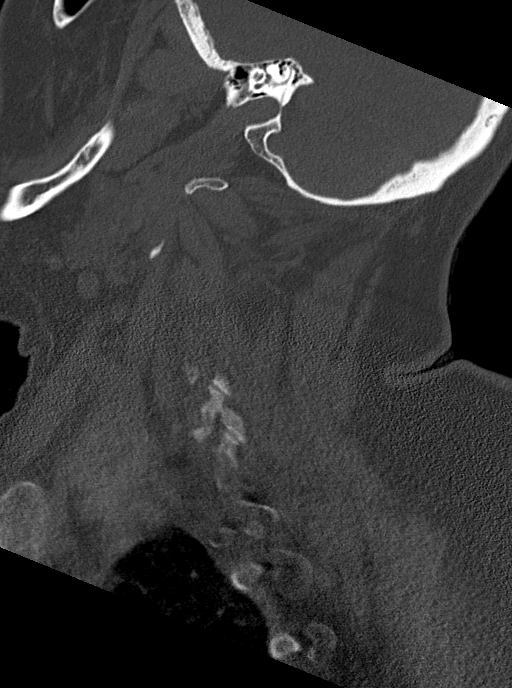
[im 21/61  bone]
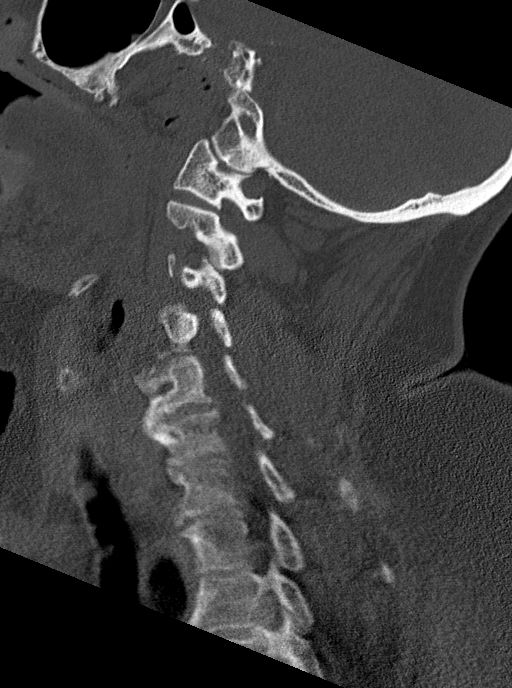
[im 31/61  bone]
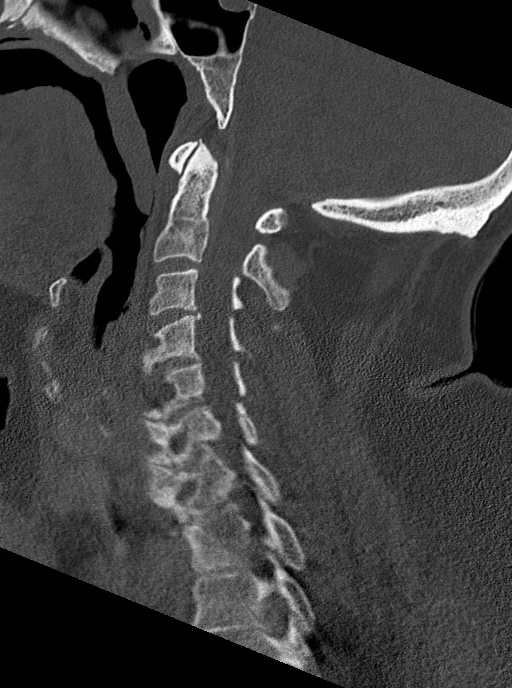
[im 41/61  bone]
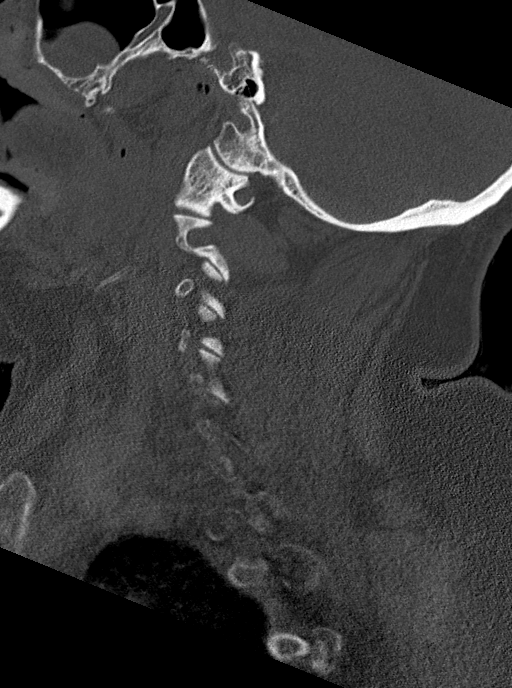
[im 51/61  bone]
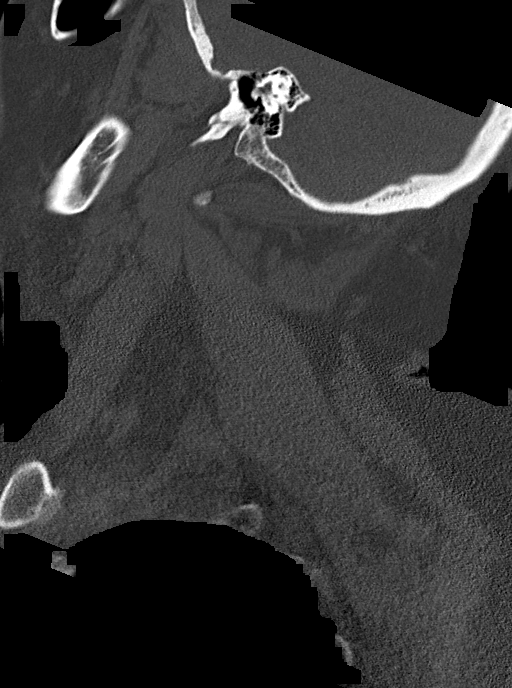

[Series 14: coronal bone · coronal · 0.23mm/px · 3 of 68 slices shown]
[im 17/68  bone]
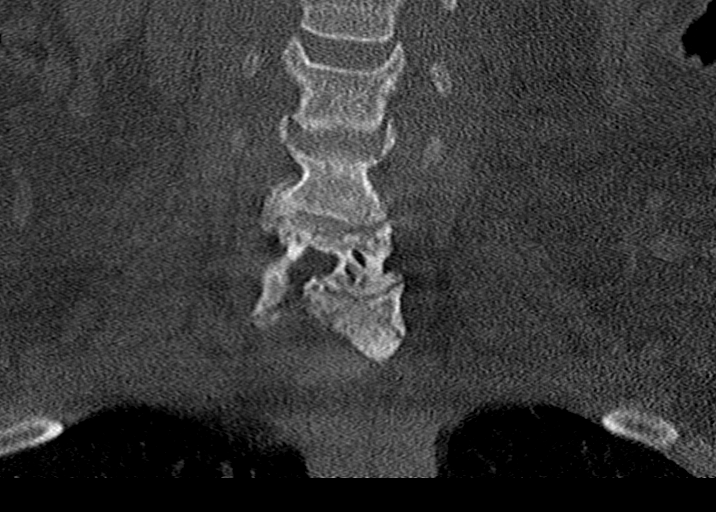
[im 34/68  bone]
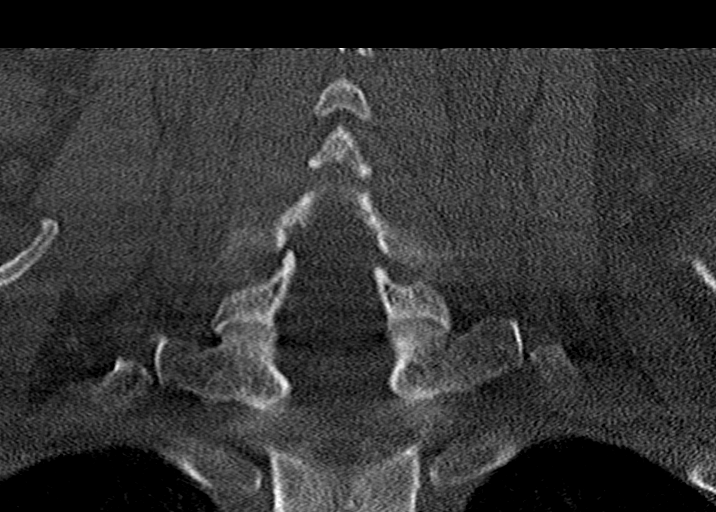
[im 51/68  bone]
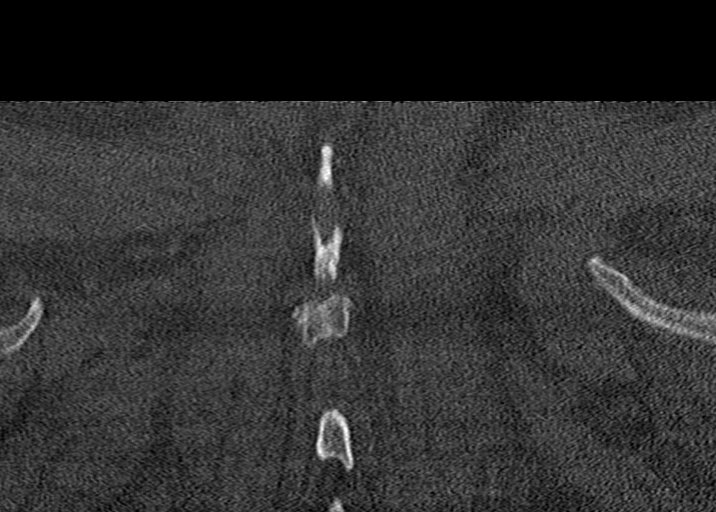

[14 of 33 positions shown; findings below may reference images not displayed]

FINDINGS: CT HEAD FINDINGS

Brain: No evidence of acute infarction, hemorrhage, hydrocephalus,
extra-axial collection or mass lesion/mass effect. Periventricular
white matter hypodensity and unchanged left frontal and right
temporal MCA territory encephalomalacia bilaterally.

Vascular: No hyperdense vessel or unexpected calcification.

Skull: Normal. Negative for fracture or focal lesion.

Sinuses/Orbits: No acute finding.

Other: None.

CT CERVICAL SPINE FINDINGS

Alignment: Normal.

Skull base and vertebrae: No acute fracture. No primary bone lesion
or focal pathologic process.

Soft tissues and spinal canal: No prevertebral fluid or swelling. No
visible canal hematoma.

Disc levels: Moderate multilevel disc space height loss and
osteophytosis.

Upper chest: Negative.

Other: None.
IMPRESSION: 1. No acute intracranial pathology. Unchanged small-vessel white
matter disease and bilateral MCA territory encephalomalacia.

2. No fracture or static subluxation of the cervical spine. Moderate
multilevel disc degenerative disease and osteophytosis.

## 2019-04-21 IMAGING — CR DG KNEE COMPLETE 4+V*R*
3 series · 3 of 3 positions shown · non-contrast
Comparison: None.

CLINICAL DATA: Fall

EXAM:
RIGHT KNEE - COMPLETE 4+ VIEW

[t knee ap right]
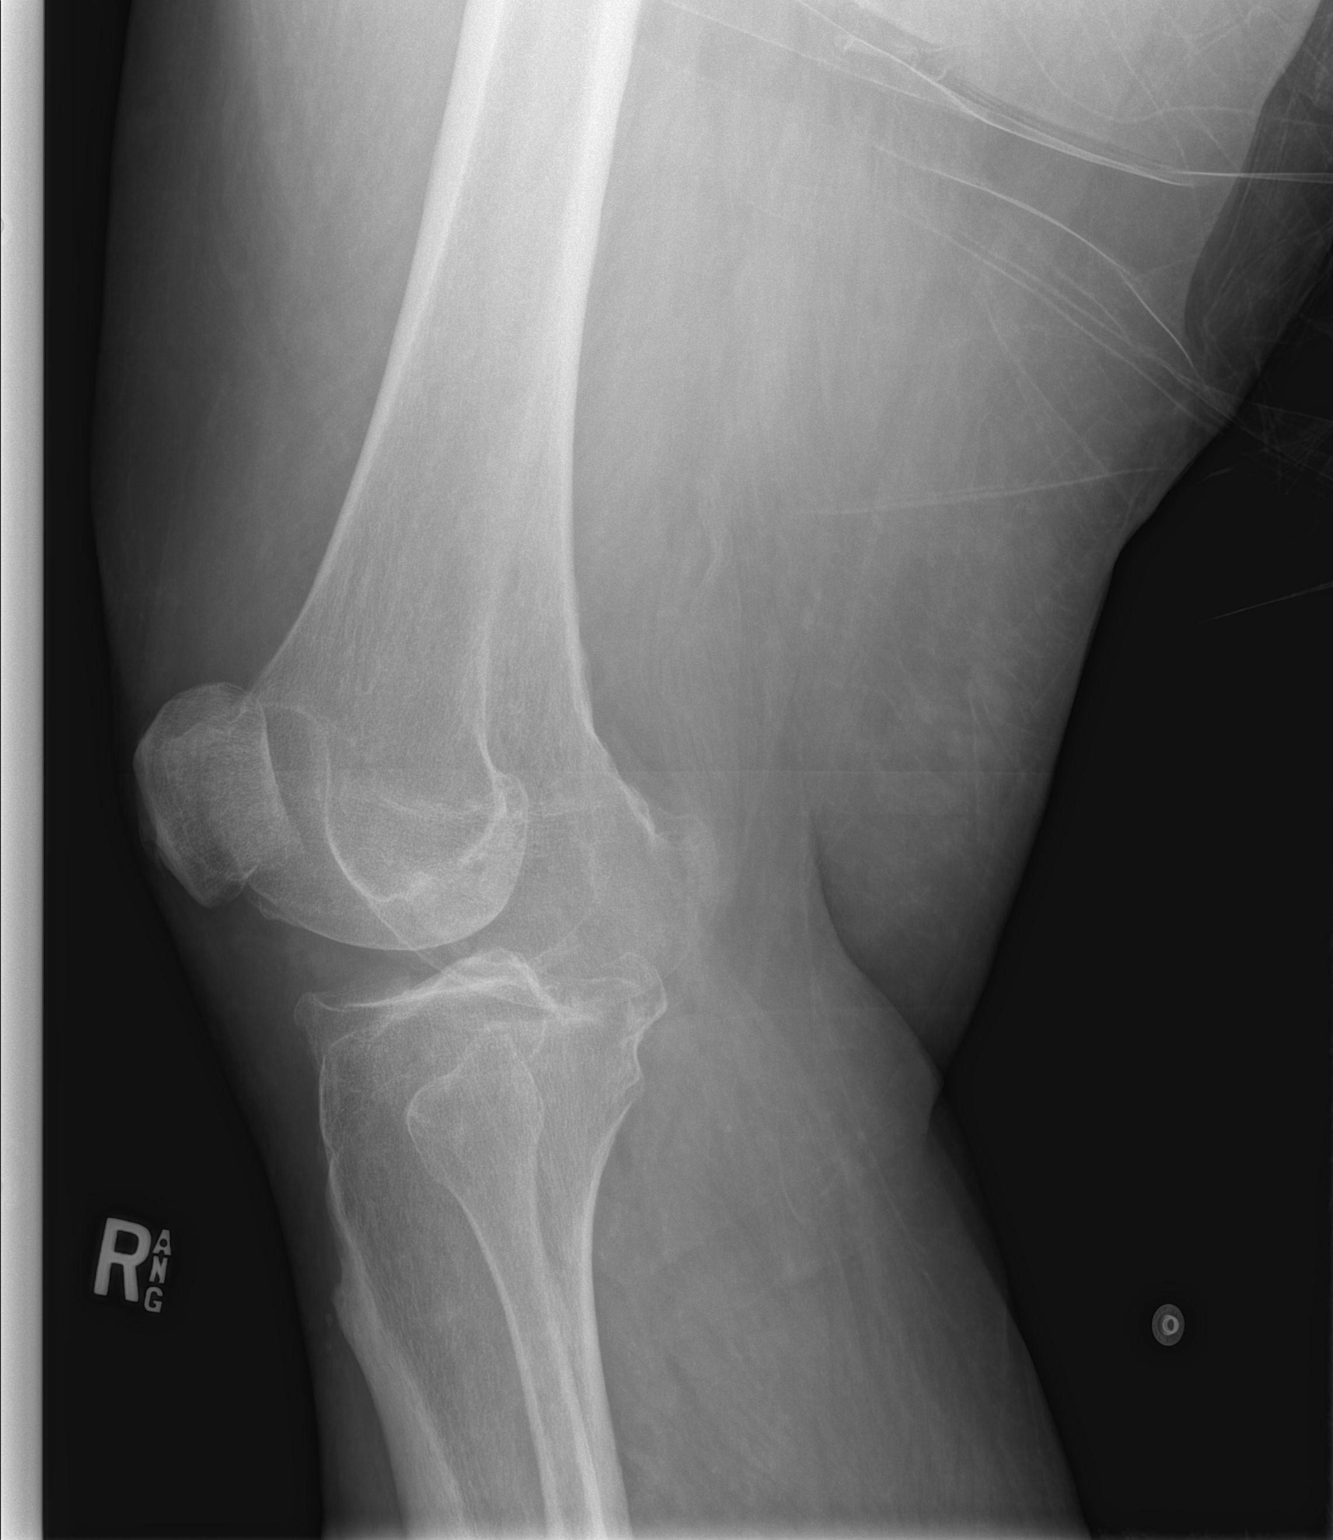

[x knee ap right (1 of 2)]
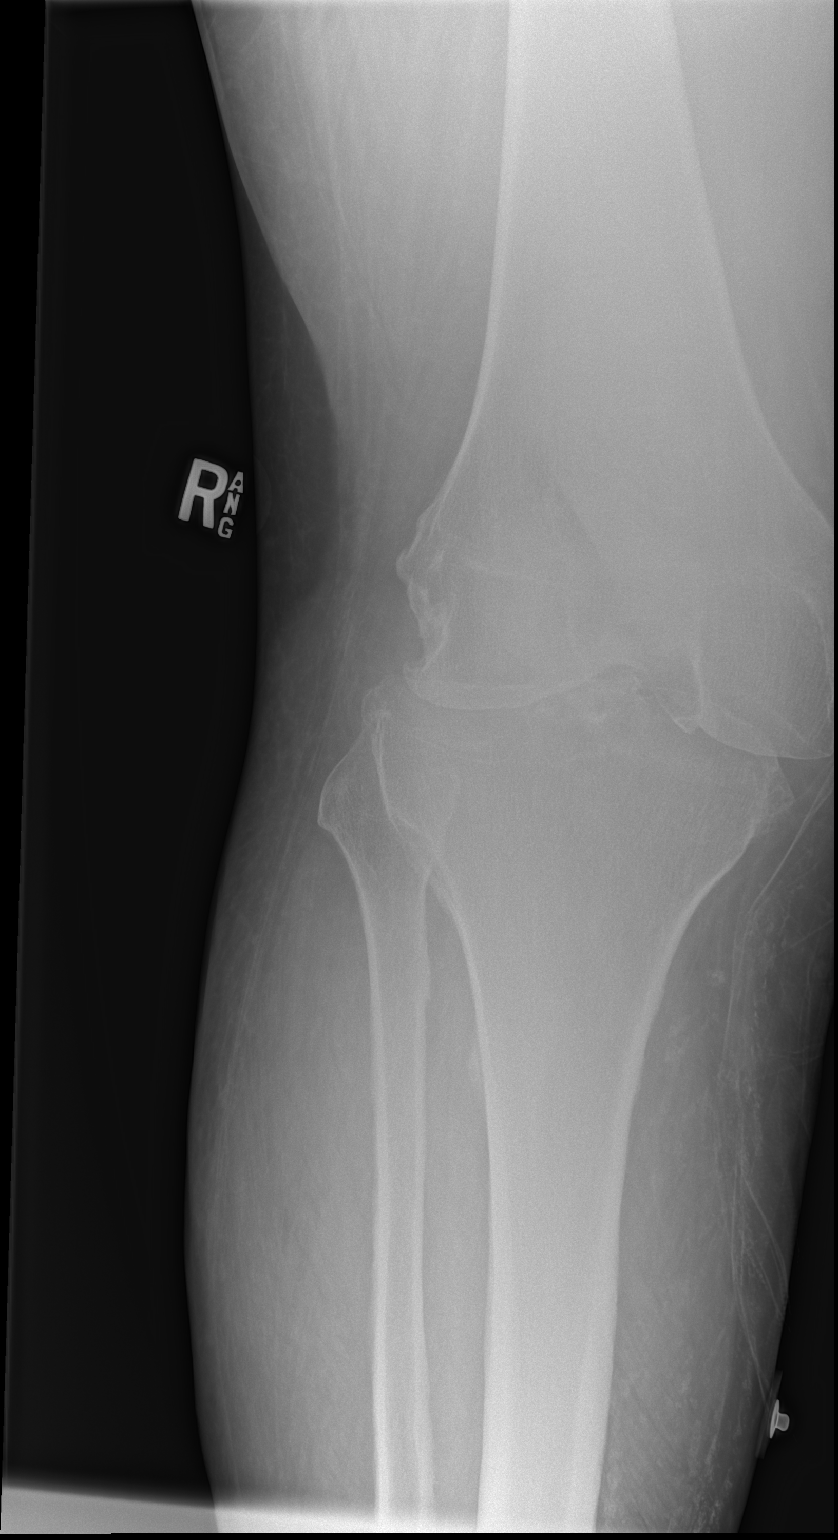

[x knee ap right (2 of 2)]
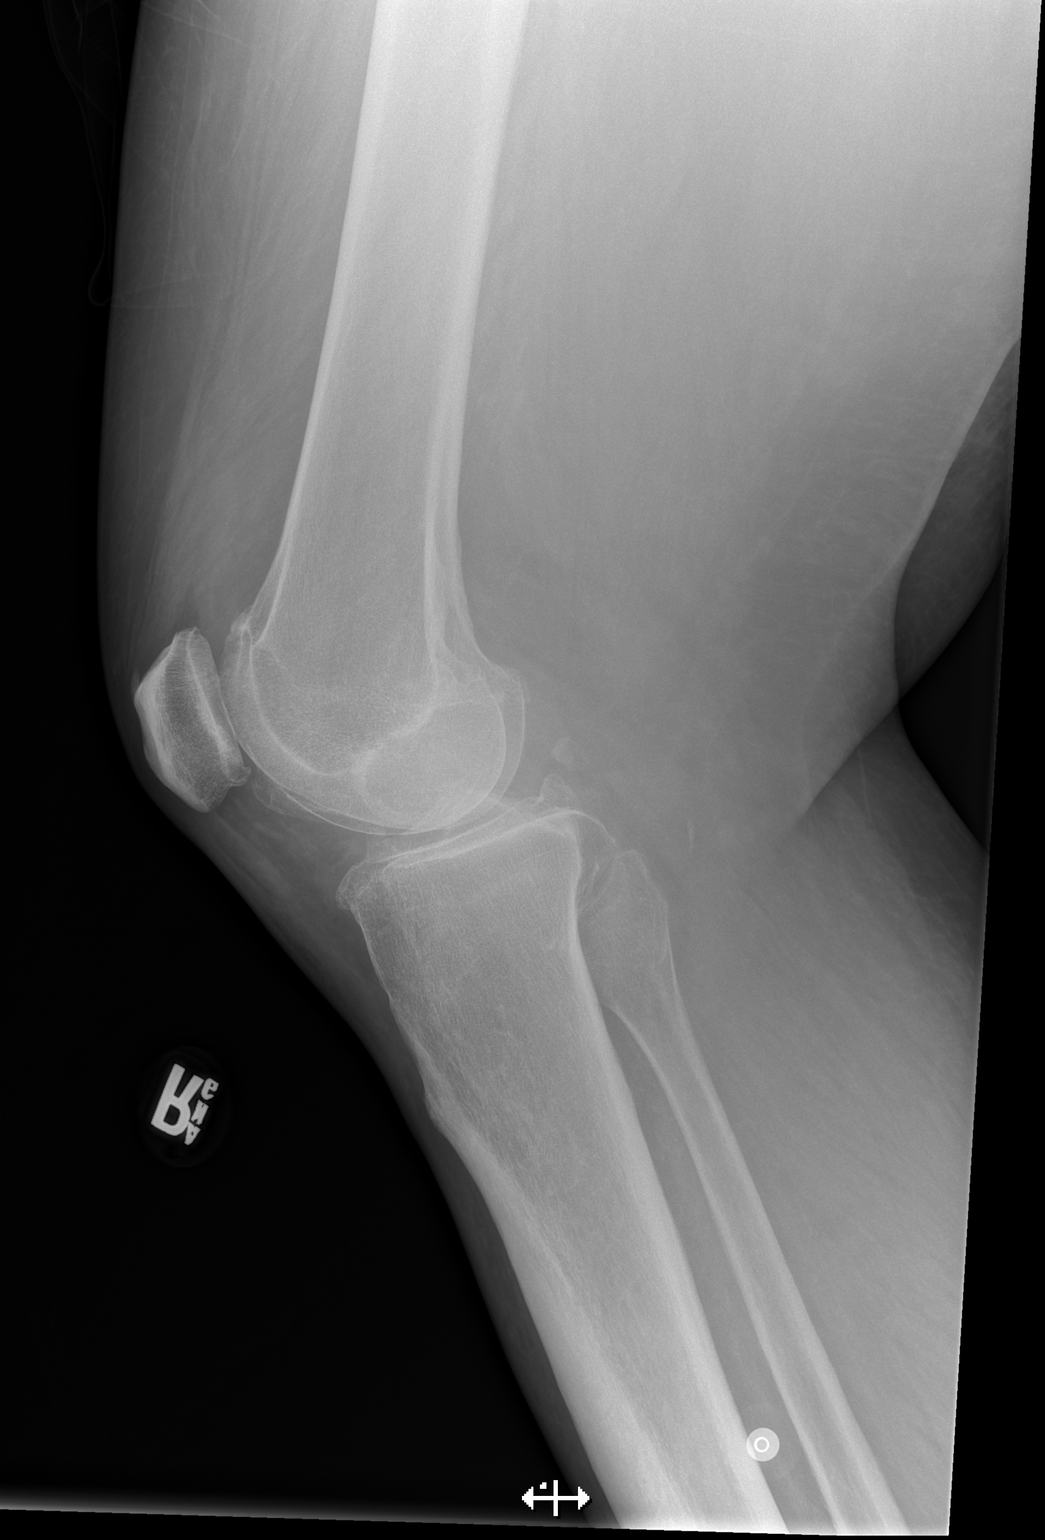

[3 of 3 positions shown; findings below may reference images not displayed]

FINDINGS: No fracture or dislocation of the right knee. Moderate
tricompartmental joint space narrowing and osteophytosis. Moderate,
nonspecific knee joint effusion. Soft tissues are unremarkable.
IMPRESSION: No fracture or dislocation of the right knee. Moderate
tricompartmental joint space narrowing and osteophytosis. Moderate,
nonspecific knee joint effusion.

## 2019-04-21 MED ORDER — ACETAMINOPHEN 500 MG PO TABS
1000.0000 mg | ORAL_TABLET | Freq: Once | ORAL | Status: AC
  Administered 2019-04-21: 20:00:00 1000 mg via ORAL
  Filled 2019-04-21: qty 2

## 2019-04-21 MED ORDER — TRAMADOL HCL 50 MG PO TABS
50.0000 mg | ORAL_TABLET | Freq: Once | ORAL | Status: AC
  Administered 2019-04-21: 50 mg via ORAL
  Filled 2019-04-21: qty 1

## 2019-04-21 NOTE — ED Notes (Signed)
[facility] called for transportation. Paperwork at nurses station. 

## 2019-04-21 NOTE — Discharge Instructions (Addendum)
It was our pleasure to provide your ER care today - we hope that you feel better.  Wear ankle/knee support as need.   Take acetaminophen as need for pain.   Use walker for added stability. Fall precautions.   We also made a home health referral - they should be contacting you in the next couple days.   Follow up with your doctor in the coming week.  Return to ER if worse, new symptoms, new/severe or intractable pain, numbness/weakness, or other concern.

## 2019-04-21 NOTE — ED Notes (Signed)
Pt. Documented in error see above note in chart. 

## 2019-04-21 NOTE — ED Notes (Addendum)
Pt. Documented in error CT Cervical Spine w/o Contrast.

## 2019-04-21 NOTE — ED Provider Notes (Addendum)
[facility] DEPT Provider Note   CSN: [id number] Arrival date & time: 04/21/19  1639     History   Chief Complaint Chief Complaint  Patient presents with   Fall    HPI TWYMAN PRUSAK is a 63 y.o. male.     Patient with hx afib, s/p fall at home this afternoon. Indicates was mechanical fall on way outside. Hit head. No loc. C/o right hip, knee and foot/ankle area pain. Symptoms acute onset, moderate-severe, constant, worse w palpation/movement. No faintness or dizziness prior to fall, states felt normal/at baseline earlier today. Dull right headache post head contusion. Denies neck/back pain (although some cervical tenderness on exam). No radicular pain. No numbness/weakness. Denies chest pain or sob. No abd pain or nv. Skin intact.   The history is provided by the patient and the EMS personnel.  Fall Associated symptoms include headaches. Pertinent negatives include no chest pain, no abdominal pain and no shortness of breath.    Past Medical History:  Diagnosis Date   Arthritis    Atrial fibrillation    HX OF SICK SINUS SYNDROME-ATRIAL FIB   Back pain, chronic    PT STATES 5 BULGING DISKS AND PINCHED NERVES--PT ON OXYCODONE 4 TIMES A DAY FOR HIS PAIN   Chest pain 08/26/2015   Chronic airway obstruction, not elsewhere classified 12/21/2012   GERD (gastroesophageal reflux disease)    Hypertension    Impaired fasting glucose 04/19/2013   Peripheral vascular disease ([place])    CHRONIC VENOUS INSUFFICIENCY   Shortness of breath    Sleep apnea    UNABLE TO TOLERATE CPAP MASK BECAUSE OF CLAUSTROPHOBIA AND DOES NOT HAVE MASK OR MACHINE AT HOME   Stroke [facility])    Wears dentures     Patient Active Problem List   Diagnosis Date Noted   Chronic atrial fibrillation ([place]) 08/26/2015   Hyperlipidemia LDL goal <100 06/26/2014   Routine general medical examination at a health care facility 11/21/2013   Screening PSA (prostate specific  antigen) 11/21/2013   Special screening for malignant neoplasms, colon 11/21/2013   OSA (obstructive sleep apnea) 08/22/2013   Morbid obesity ([place]) 04/19/2013   Other emphysema ([place]) 01/31/2013   Essential hypertension 12/21/2012   Insomnia 12/21/2012   GERD (gastroesophageal reflux disease) 09/20/2012   Back pain, chronic 09/20/2012   Obesity hypoventilation syndrome ([place]) 09/20/2012   Adjustment disorder with mixed anxiety and depressed mood 09/20/2012    Past Surgical History:  Procedure Laterality Date   ESOPHAGOGASTRODUODENOSCOPY  05/18/2011   Procedure: ESOPHAGOGASTRODUODENOSCOPY (EGD);  Surgeon: Shann Medal, MD;  Location: [facility];  Service: Endoscopy;  Laterality: N/A;   FOOT SURGERY  2003   right foot surgery from work accident    [place]  10/19/11   [place]   calcium deposit removed on right  leg         Home Medications    Prior to Admission medications   Medication Sig Start Date End Date Taking? Authorizing Provider  albuterol (PROVENTIL HFA;VENTOLIN HFA) 108 (90 Base) MCG/ACT inhaler Inhale 2 puffs into the lungs every 4 (four) hours as needed for wheezing or shortness of breath. 08/30/15   Gildardo Cranker, DO  amitriptyline (ELAVIL) 50 MG tablet TAKE 1 TABLET BY MOUTH EVERYDAY AT BEDTIME Patient taking differently: Take 50 mg by mouth at bedtime.  10/14/18   Pleas Koch, NP  aspirin EC 81 MG tablet Take 81 mg by mouth  daily.    [provider]  atorvastatin (LIPITOR) 20 MG tablet Take 1 tablet (20 mg total) by mouth daily. For cholesterol. 08/24/18   Pleas Koch, NP  gabapentin (NEURONTIN) 600 MG tablet TAKE 1 TABLET BY MOUTH THREE TIMES A DAY Patient taking differently: Take 600 mg by mouth 3 (three) times daily.  10/11/18   Pleas Koch, NP  lisinopril (PRINIVIL,ZESTRIL) 20 MG tablet Take 1 tablet (20 mg total) by mouth daily. For blood pressure. 08/16/18   Pleas Koch, NP  metoprolol succinate (TOPROL-XL) 100 MG 24 hr tablet Take 100 mg by mouth daily. 01/19/19   [provider]  oxyCODONE (OXY IR/ROXICODONE) 5 MG immediate release tablet Take 5 mg by mouth 3 (three) times daily. 01/19/19   [provider]  pantoprazole (PROTONIX) 40 MG tablet Take 40 mg by mouth daily. 01/19/19   [provider]  PARoxetine (PAXIL) 10 MG tablet Take 10 mg by mouth daily. 01/19/19   [provider]  metoprolol tartrate (LOPRESSOR) 25 MG tablet Take 1 tablet (25 mg total) by mouth 2 (two) times daily. For blood pressure and heart rate. Patient not taking: Reported on 01/21/2019 09/15/18 01/21/19  Pleas Koch, NP  rivaroxaban (XARELTO) 20 MG TABS tablet Take 1 tablet (20 mg total) by mouth daily with supper. For blood clot prevention. Patient not taking: Reported on 10/03/2018 08/24/18 01/21/19  Pleas Koch, NP    Family History Family History  Problem Relation Age of Onset   Cancer Mother        melanoma   Heart disease Mother    Rheum arthritis Mother    Lung cancer Father        melanoma   Heart disease Father    Heart disease Sister     Social History Social History   Tobacco Use   Smoking status: Current Every Day Smoker    Packs/day: 0.50    Years: 40.00    Pack years: 20.00    Types: Cigarettes   Smokeless tobacco: Never Used  Substance Use Topics   Alcohol use: Yes    Alcohol/week: 0.0 standard drinks    Comment: RARELY   Drug use: No     Allergies   Vioxx [rofecoxib]   Review of Systems Review of Systems  Constitutional: Negative for fever.  HENT: Negative for nosebleeds.   Eyes: Negative for pain, redness and visual disturbance.  Respiratory: Negative for shortness of breath.   Cardiovascular: Negative for chest pain.  Gastrointestinal: Negative for abdominal pain, nausea and vomiting.  Endocrine: Negative for polyuria.  Genitourinary: Negative for flank pain.  Musculoskeletal:  Negative for back pain.  Skin: Negative for wound.  Neurological: Positive for headaches. Negative for weakness and numbness.  Hematological: Does not bruise/bleed easily.  Psychiatric/Behavioral: Negative for confusion.     Physical Exam Updated Vital Signs BP 128/77 (BP Location: Left Arm)    Pulse 81    Temp (!) 97.3 F (36.3 C) (Oral)    Resp 18    Ht 1.981 m (6\' 6" )    Wt (!) 154.7 kg    SpO2 94%    BMI 39.41 kg/m   Physical Exam Vitals signs and nursing note reviewed.  Constitutional:      Appearance: Normal appearance. He is well-developed.  HENT:     Head: Atraumatic.     Nose: Nose normal.     Mouth/Throat:     Mouth: Mucous membranes are moist.  Pharynx: Oropharynx is clear.  Eyes:     General: No scleral icterus.    Conjunctiva/sclera: Conjunctivae normal.     Pupils: Pupils are equal, round, and reactive to light.  Neck:     Musculoskeletal: Normal range of motion and neck supple. No neck rigidity.     Trachea: No tracheal deviation.  Cardiovascular:     Rate and Rhythm: Normal rate and regular rhythm.     Pulses: Normal pulses.     Heart sounds: Normal heart sounds. No murmur. No friction rub. No gallop.   Pulmonary:     Effort: Pulmonary effort is normal. No accessory muscle usage or respiratory distress.     Breath sounds: Normal breath sounds.  Chest:     Chest wall: No tenderness.  Abdominal:     General: Bowel sounds are normal. There is no distension.     Palpations: Abdomen is soft.     Tenderness: There is no abdominal tenderness. There is no guarding.     Comments: Morbidly obese.   Genitourinary:    Comments: No cva tenderness. Musculoskeletal:     Comments: Mid cervical tenderness, otherwise, CTLS spine, non tender, aligned, no step off. Tenderness right hip, right knee, right ankle and foot. Distal pulses palp bil. No other focal bony tenderness on extremity exam.   Skin:    General: Skin is warm and dry.     Findings: No rash.    Neurological:     Mental Status: He is alert.     Comments: Alert, speech clear. GCS 15. Motor intact bil, stre 5/5. sens grossly intact.  Psychiatric:        Mood and Affect: Mood normal.      ED Treatments / Results  Labs (all labs ordered are listed, but only abnormal results are displayed) Labs Reviewed - No data to display  EKG None  Radiology Dg Tibia/fibula Right  Result Date: 04/21/2019 CLINICAL DATA:  Persistent pain after fall EXAM: RIGHT TIBIA AND FIBULA - 2 VIEW COMPARISON:  None. FINDINGS: There is questionable step-off/fracture deformity at the posterior malleolus of the distal tibia on the lateral view. Otherwise no fracture or malalignment is seen. IMPRESSION: Questionable lucency/deformity at the posterior malleolus of the distal tibia. Correlate for point tenderness to the region Electronically Signed   By: Donavan Foil M.D.   On: 04/21/2019 20:23   Xr Ankle R  Result Date: 04/21/2019 CLINICAL DATA:  Fall, pain EXAM: RIGHT FOOT COMPLETE - 3+ VIEW; RIGHT ANKLE - COMPLETE 3+ VIEW COMPARISON:  11/24/2013 FINDINGS: No fracture or dislocation of the right foot or or ankle. Redemonstrated postoperative findings of partial right fifth metatarsal resection. Mild osteoarthritic pattern arthrosis throughout. Soft tissues are unremarkable. IMPRESSION: No fracture or dislocation of the right foot or or ankle. Electronically Signed   By: Eddie Candle M.D.   On: 04/21/2019 19:15   Ct Head Wo Contrast  Result Date: 04/21/2019 CLINICAL DATA:  Fall EXAM: CT HEAD WITHOUT CONTRAST CT CERVICAL SPINE WITHOUT CONTRAST TECHNIQUE: Multidetector CT imaging of the head and cervical spine was performed following the standard protocol without intravenous contrast. Multiplanar CT image reconstructions of the cervical spine were also generated. COMPARISON:  08/25/2015 FINDINGS: CT HEAD FINDINGS Brain: No evidence of acute infarction, hemorrhage, hydrocephalus, extra-axial collection or mass  lesion/mass effect. Periventricular white matter hypodensity and unchanged left frontal and right temporal MCA territory encephalomalacia bilaterally. Vascular: No hyperdense vessel or unexpected calcification. Skull: Normal. Negative for fracture or focal lesion. Sinuses/Orbits: No  acute finding. Other: None. CT CERVICAL SPINE FINDINGS Alignment: Normal. Skull base and vertebrae: No acute fracture. No primary bone lesion or focal pathologic process. Soft tissues and spinal canal: No prevertebral fluid or swelling. No visible canal hematoma. Disc levels: Moderate multilevel disc space height loss and osteophytosis. Upper chest: Negative. Other: None. IMPRESSION: 1. No acute intracranial pathology. Unchanged small-vessel white matter disease and bilateral MCA territory encephalomalacia. 2. No fracture or static subluxation of the cervical spine. Moderate multilevel disc degenerative disease and osteophytosis. Electronically Signed   By: Eddie Candle M.D.   On: 04/21/2019 19:09   Ct Cervical Spine Wo Contrast  Result Date: 04/21/2019 CLINICAL DATA:  Fall EXAM: CT HEAD WITHOUT CONTRAST CT CERVICAL SPINE WITHOUT CONTRAST TECHNIQUE: Multidetector CT imaging of the head and cervical spine was performed following the standard protocol without intravenous contrast. Multiplanar CT image reconstructions of the cervical spine were also generated. COMPARISON:  08/25/2015 FINDINGS: CT HEAD FINDINGS Brain: No evidence of acute infarction, hemorrhage, hydrocephalus, extra-axial collection or mass lesion/mass effect. Periventricular white matter hypodensity and unchanged left frontal and right temporal MCA territory encephalomalacia bilaterally. Vascular: No hyperdense vessel or unexpected calcification. Skull: Normal. Negative for fracture or focal lesion. Sinuses/Orbits: No acute finding. Other: None. CT CERVICAL SPINE FINDINGS Alignment: Normal. Skull base and vertebrae: No acute fracture. No primary bone lesion or focal  pathologic process. Soft tissues and spinal canal: No prevertebral fluid or swelling. No visible canal hematoma. Disc levels: Moderate multilevel disc space height loss and osteophytosis. Upper chest: Negative. Other: None. IMPRESSION: 1. No acute intracranial pathology. Unchanged small-vessel white matter disease and bilateral MCA territory encephalomalacia. 2. No fracture or static subluxation of the cervical spine. Moderate multilevel disc degenerative disease and osteophytosis. Electronically Signed   By: Eddie Candle M.D.   On: 04/21/2019 19:09   Xr Knee R  Result Date: 04/21/2019 CLINICAL DATA:  Fall EXAM: RIGHT KNEE - COMPLETE 4+ VIEW COMPARISON:  None. FINDINGS: No fracture or dislocation of the right knee. Moderate tricompartmental joint space narrowing and osteophytosis. Moderate, nonspecific knee joint effusion. Soft tissues are unremarkable. IMPRESSION: No fracture or dislocation of the right knee. Moderate tricompartmental joint space narrowing and osteophytosis. Moderate, nonspecific knee joint effusion. Electronically Signed   By: Eddie Candle M.D.   On: 04/21/2019 19:11   Xr Foot R  Result Date: 04/21/2019 CLINICAL DATA:  Fall, pain EXAM: RIGHT FOOT COMPLETE - 3+ VIEW; RIGHT ANKLE - COMPLETE 3+ VIEW COMPARISON:  11/24/2013 FINDINGS: No fracture or dislocation of the right foot or or ankle. Redemonstrated postoperative findings of partial right fifth metatarsal resection. Mild osteoarthritic pattern arthrosis throughout. Soft tissues are unremarkable. IMPRESSION: No fracture or dislocation of the right foot or or ankle. Electronically Signed   By: Eddie Candle M.D.   On: 04/21/2019 19:15   Xr Hip Right  Result Date: 04/21/2019 CLINICAL DATA:  Fall, pain EXAM: DG HIP (WITH OR WITHOUT PELVIS) 2-3V RIGHT COMPARISON:  None. FINDINGS: No fracture or dislocation of the right hip. Mild superior joint space narrowing and osteophytosis. The pelvis and proximal left femur are unremarkable in frontal  view only. Nonobstructive pattern of overlying bowel gas. IMPRESSION: No fracture or dislocation of the right hip. Mild superior joint space narrowing and osteophytosis. Please note that plain radiographs are insensitive for hip and pelvic fracture. Electronically Signed   By: Eddie Candle M.D.   On: 04/21/2019 19:12   Dg Femur Min 2 Views Right  Result Date: 04/21/2019 CLINICAL DATA:  Persistent pain  after fall EXAM: RIGHT FEMUR 2 VIEWS COMPARISON:  04/21/2019 FINDINGS: Moderate arthritis right hip. No definitive displaced fracture. Distal femur evaluation limited by technical artifact. Lucency at the posterior cortex of the femur shaft favor nutrient foramen. IMPRESSION: 1. Limited evaluation of distal femur lateral view secondary to technique. An oblique lucency at the posterior cortex of the shaft of the femur is to represent nutrient foramen over fracture. Electronically Signed   By: Donavan Foil M.D.   On: 04/21/2019 20:21    Procedures Procedures (including critical care time)  Medications Ordered in ED Medications - No data to display   Initial Impression / Assessment and Plan / ED Course  I have reviewed the triage vital signs and the nursing notes.  Pertinent labs & imaging results that were available during my care of the patient were reviewed by me and considered in my medical decision making (see chart for details).  Iv ns. EMS gave fentanyl and ketamine for pain shortly pta.   Imaging studies ordered.  Reviewed nursing notes and prior charts for additional history.   xrays reviewed/interpreted by me - no fx.   ASO brace to right ankle.   Acetaminophen po. Po fluids.   Patient indicates at baseline uses two canes/crutches to walk with. Discussed possible need for walker/wheelchair w pt, given recent fall/injury on his baseline very limited mobilty. Given mobility and deconditioning issues, will also make home health referral.  No focal tenderness at post mall. Knee and  ankle grossly stable on exam. Compartments of leg soft, not tense, no significant sts on exam. Distal pulses palp. Pain controlled.   Pt requests knee support/imm - provided to him.   rec close pcp f/u.  Return precautions provided.     Final Clinical Impressions(s) / ED Diagnoses   Final diagnoses:  None    ED Discharge Orders    None           Lajean Saver, MD 04/21/19 2037

## 2019-04-21 NOTE — ED Triage Notes (Addendum)
Pt arrives POV from home with c/o right ankle, leg, knee, hip pain after a mechanical fall today. Pt denies LOC, blood thinners, head injury, new back or neck pain. Pt is chronic opiate user due to chronic back pain, pt denies new back pain. Pt received 121mcg  fentanyl IM and 20mg  Ketamine IV. C- collar placed by EMS

## 2019-04-21 NOTE — ED Notes (Signed)
Pt was verbalized discharge instructions. Pt had no further questions at this time. NAD. 

## 2019-04-21 NOTE — ED Notes (Signed)
Pt transported to radiology.

## 2019-04-21 NOTE — ED Notes (Signed)
Patient requesting knee brace with metal on sides because his knee is "what made me fall in the first place and an ace wrap won't work due to my weight." Patient made aware that provider will have to evaluate need for brace. EDP made aware of request.

## 2019-04-21 NOTE — ED Notes (Signed)
Pt requesting water. Pt informed that he is unable to eat or drink at this time. Pt provided with mouth swabs

## 2019-04-23 ENCOUNTER — Other Ambulatory Visit: Payer: Self-pay

## 2019-04-23 ENCOUNTER — Emergency Department (HOSPITAL_COMMUNITY)
Admission: EM | Admit: 2019-04-23 | Discharge: 2019-04-24 | Disposition: A | Discharge status: Home / Self Care | Payer: Medicare PPO | Attending: Emergency Medicine | Admitting: Emergency Medicine

## 2019-04-23 ENCOUNTER — Encounter (HOSPITAL_COMMUNITY): Payer: Self-pay | Admitting: *Deleted

## 2019-04-23 DIAGNOSIS — Z79899 Other long term (current) drug therapy: Secondary | ICD-10-CM | POA: Diagnosis not present

## 2019-04-23 DIAGNOSIS — S99911A Unspecified injury of right ankle, initial encounter: Secondary | ICD-10-CM | POA: Diagnosis present

## 2019-04-23 DIAGNOSIS — S93401D Sprain of unspecified ligament of right ankle, subsequent encounter: Secondary | ICD-10-CM

## 2019-04-23 DIAGNOSIS — F17210 Nicotine dependence, cigarettes, uncomplicated: Secondary | ICD-10-CM | POA: Diagnosis not present

## 2019-04-23 DIAGNOSIS — Y92009 Unspecified place in unspecified non-institutional (private) residence as the place of occurrence of the external cause: Secondary | ICD-10-CM | POA: Diagnosis not present

## 2019-04-23 DIAGNOSIS — Y939 Activity, unspecified: Secondary | ICD-10-CM | POA: Diagnosis not present

## 2019-04-23 DIAGNOSIS — I10 Essential (primary) hypertension: Secondary | ICD-10-CM | POA: Diagnosis not present

## 2019-04-23 DIAGNOSIS — S93401A Sprain of unspecified ligament of right ankle, initial encounter: Secondary | ICD-10-CM | POA: Diagnosis not present

## 2019-04-23 DIAGNOSIS — Z7982 Long term (current) use of aspirin: Secondary | ICD-10-CM | POA: Diagnosis not present

## 2019-04-23 DIAGNOSIS — Y999 Unspecified external cause status: Secondary | ICD-10-CM | POA: Diagnosis not present

## 2019-04-23 DIAGNOSIS — W19XXXA Unspecified fall, initial encounter: Secondary | ICD-10-CM | POA: Insufficient documentation

## 2019-04-23 LAB — COMPREHENSIVE METABOLIC PANEL
ALT: 19 U/L (ref 0–44)
AST: 39 U/L (ref 15–41)
Albumin: 3.9 g/dL (ref 3.5–5.0)
Alkaline Phosphatase: 84 U/L (ref 38–126)
Anion gap: 14 (ref 5–15)
BUN: 25 mg/dL — ABNORMAL HIGH (ref 8–23)
CO2: 23 mmol/L (ref 22–32)
Calcium: 9.8 mg/dL (ref 8.9–10.3)
Chloride: 101 mmol/L (ref 98–111)
Creatinine, Ser: 0.88 mg/dL (ref 0.61–1.24)
GFR calc Af Amer: >60 mL/min (ref >60)
GFR calc non Af Amer: >60 mL/min (ref >60)
Glucose, Bld: 99 mg/dL (ref 70–99)
Potassium: 3.9 mmol/L (ref 3.5–5.1)
Sodium: 138 mmol/L (ref 135–145)
Total Bilirubin: 1.8 mg/dL — ABNORMAL HIGH (ref 0.3–1.2)
Total Protein: 7.7 g/dL (ref 6.5–8.1)

## 2019-04-23 LAB — CBC
HCT: 47.8 % (ref 39.0–52.0)
Hemoglobin: 15.4 g/dL (ref 13.0–17.0)
MCH: 27.6 pg (ref 26.0–34.0)
MCHC: 32.2 g/dL (ref 30.0–36.0)
MCV: 85.8 fL (ref 80.0–100.0)
Platelets: 273 10*3/uL (ref 150–400)
RBC: 5.57 MIL/uL (ref 4.22–5.81)
RDW: 15.3 % (ref 11.5–15.5)
WBC: 10.5 10*3/uL (ref 4.0–10.5)
nRBC: 0 % (ref 0.0–0.2)

## 2019-04-23 LAB — LIPASE, BLOOD: Lipase: 28 U/L (ref 11–51)

## 2019-04-23 MED ORDER — SODIUM CHLORIDE 0.9% FLUSH: 3.0000 mL | Freq: Once | INTRAVENOUS | Status: DC

## 2019-04-23 NOTE — ED Triage Notes (Signed)
Pt reporting abdominal pain and vomiting since he woke up this morning. When he fell, he hit his head, no LOC. Says he is just hurting all over.

## 2019-04-23 NOTE — ED Notes (Signed)
Pt is now inquiring about wallet that was with him with EMS.

## 2019-04-23 NOTE — ED Triage Notes (Signed)
Pt arrives via [facility] from home with fall 2 days ago, seen at [facility] for the same. Today, pt was getting up, slipped. C/o abdominal cramping and groin pain. Increased hip pain, foot, ankle, lower back, and in between shoulder blades was occurring prior, now worse. 180/100, afib 80, 22r, spo2 97% 98.1.  Swelling to right ankle and right foot is baseline for patient.

## 2019-04-24 ENCOUNTER — Encounter (HOSPITAL_COMMUNITY): Payer: Self-pay | Admitting: *Deleted

## 2019-04-24 NOTE — ED Notes (Signed)
Patient moved to hallway  Waiting for social worker consult

## 2019-04-24 NOTE — Discharge Planning (Signed)
EDCM consulted to offer home health services. EDCM met with pt at bedside who accepts home health services.  Pt asked for a list of home health services accepted by his insurance.  EDCM will provide and await selection by pt.  Careli Luzader J. Clydene Laming, [place], [place], [place]

## 2019-04-24 NOTE — ED Provider Notes (Signed)
[facility] EMERGENCY DEPARTMENT Provider Note  CSN: [id number] Arrival date & time: 04/23/19 1716  Chief Complaint(s) Fall    HPI Kyle [name] is a 63 y.o. male here after fall at home yesterday morning. Patient fell several days ago and sustained right ankle sprain which makes walking and keeping balance difficult. No head trauma. C/o abdominal cramping and groin pain. Increased hip pain, foot, ankle, lower back, and in between shoulder blades was occurring prior. No current pain at time of my assessment.   HPI  Past Medical History Past Medical History:  Diagnosis Date   Arthritis    Atrial fibrillation    HX OF SICK SINUS SYNDROME-ATRIAL FIB   Back pain, chronic    PT STATES 5 BULGING DISKS AND PINCHED NERVES--PT ON OXYCODONE 4 TIMES A DAY FOR HIS PAIN   Chest pain 08/26/2015   Chronic airway obstruction, not elsewhere classified 12/21/2012   GERD (gastroesophageal reflux disease)    Hypertension    Impaired fasting glucose 04/19/2013   Peripheral vascular disease ([place])    CHRONIC VENOUS INSUFFICIENCY   Shortness of breath    Sleep apnea    UNABLE TO TOLERATE CPAP MASK BECAUSE OF CLAUSTROPHOBIA AND DOES NOT HAVE MASK OR MACHINE AT HOME   Stroke [facility])    Wears dentures    Patient Active Problem List   Diagnosis Date Noted   Chronic atrial fibrillation ([place]) 08/26/2015   Hyperlipidemia LDL goal <100 06/26/2014   Routine general medical examination at a health care facility 11/21/2013   Screening PSA (prostate specific antigen) 11/21/2013   Special screening for malignant neoplasms, colon 11/21/2013   OSA (obstructive sleep apnea) 08/22/2013   Morbid obesity ([place]) 04/19/2013   Other emphysema ([place]) 01/31/2013   Essential hypertension 12/21/2012   Insomnia 12/21/2012   GERD (gastroesophageal reflux disease) 09/20/2012   Back pain, chronic 09/20/2012   Obesity hypoventilation syndrome ([place]) 09/20/2012   Adjustment disorder  with mixed anxiety and depressed mood 09/20/2012   Home Medication(s) Prior to Admission medications   Medication Sig Start Date End Date Taking? Authorizing Provider  albuterol (PROVENTIL HFA;VENTOLIN HFA) 108 (90 Base) MCG/ACT inhaler Inhale 2 puffs into the lungs every 4 (four) hours as needed for wheezing or shortness of breath. 08/30/15   Gildardo Cranker, DO  amitriptyline (ELAVIL) 50 MG tablet TAKE 1 TABLET BY MOUTH EVERYDAY AT BEDTIME Patient taking differently: Take 50 mg by mouth at bedtime.  10/14/18   Pleas Koch, NP  aspirin EC 81 MG tablet Take 81 mg by mouth daily.    [provider]  atorvastatin (LIPITOR) 20 MG tablet Take 1 tablet (20 mg total) by mouth daily. For cholesterol. 08/24/18   Pleas Koch, NP  atorvastatin (LIPITOR) 40 MG tablet Take 40 mg by mouth daily. 04/20/19   [provider]  gabapentin (NEURONTIN) 600 MG tablet TAKE 1 TABLET BY MOUTH THREE TIMES A DAY Patient taking differently: Take 600 mg by mouth 3 (three) times daily.  10/11/18   Pleas Koch, NP  lisinopril (PRINIVIL,ZESTRIL) 20 MG tablet Take 1 tablet (20 mg total) by mouth daily. For blood pressure. 08/16/18   Pleas Koch, NP  lisinopril (ZESTRIL) 10 MG tablet Take 10 mg by mouth daily. 04/20/19   [provider]  metoprolol succinate (TOPROL-XL) 100 MG 24 hr tablet Take 100 mg by mouth daily. 01/19/19   [provider]  nystatin cream (MYCOSTATIN)  04/20/19   [provider]  oxyCODONE (OXY IR/ROXICODONE)  5 MG immediate release tablet Take 5 mg by mouth 3 (three) times daily. 01/19/19   [provider]  pantoprazole (PROTONIX) 40 MG tablet Take 40 mg by mouth daily. 01/19/19   [provider]  PARoxetine (PAXIL) 10 MG tablet Take 10 mg by mouth daily. 01/19/19   [provider]  metoprolol tartrate (LOPRESSOR) 25 MG tablet Take 1 tablet (25 mg total) by mouth 2 (two) times daily. For blood pressure and heart rate. Patient  not taking: Reported on 01/21/2019 09/15/18 01/21/19  Pleas Koch, NP  rivaroxaban (XARELTO) 20 MG TABS tablet Take 1 tablet (20 mg total) by mouth daily with supper. For blood clot prevention. Patient not taking: Reported on 10/03/2018 08/24/18 01/21/19  Pleas Koch, NP                                                                                                                                    Past Surgical History Past Surgical History:  Procedure Laterality Date   ESOPHAGOGASTRODUODENOSCOPY  05/18/2011   Procedure: ESOPHAGOGASTRODUODENOSCOPY (EGD);  Surgeon: Shann Medal, MD;  Location: [facility];  Service: Endoscopy;  Laterality: N/A;   FOOT SURGERY  2003   right foot surgery from work accident    GASTRIC BYPASS  10/19/11   GASTRIC RESTRICTION SURGERY  1992   LEG SURGERY  1998   calcium deposit removed on right  leg    Family History Family History  Problem Relation Age of Onset   Cancer Mother        melanoma   Heart disease Mother    Rheum arthritis Mother    Lung cancer Father        melanoma   Heart disease Father    Heart disease Sister     Social History Social History   Tobacco Use   Smoking status: Current Every Day Smoker    Packs/day: 0.50    Years: 40.00    Pack years: 20.00    Types: Cigarettes   Smokeless tobacco: Never Used  Substance Use Topics   Alcohol use: Yes    Alcohol/week: 0.0 standard drinks    Comment: RARELY   Drug use: No   Allergies Vioxx [rofecoxib]  Review of Systems Review of Systems All other systems are reviewed and are negative for acute change except as noted in the HPI  Physical Exam Vital Signs  I have reviewed the triage vital signs BP (!) 150/77 (BP Location: Left Arm)    Pulse 89    Temp 97.6 F (36.4 C)    Resp 17    Ht 6\' 6"  (1.981 m)    Wt (!) 149.7 kg    SpO2 99%    BMI 38.14 kg/m   Physical Exam Constitutional:      General: He is not in acute distress.    Appearance: He is  well-developed. He is morbidly obese. He is not diaphoretic.  HENT:     Head: Normocephalic.     Right Ear: External ear normal.     Left Ear: External ear normal.  Eyes:     General: No scleral icterus.       Right eye: No discharge.        Left eye: No discharge.     Conjunctiva/sclera: Conjunctivae normal.     Pupils: Pupils are equal, round, and reactive to light.  Neck:     Musculoskeletal: Normal range of motion and neck supple.  Cardiovascular:     Rate and Rhythm: Regular rhythm.     Pulses:          Radial pulses are 2+ on the right side and 2+ on the left side.       Dorsalis pedis pulses are 2+ on the right side and 2+ on the left side.     Heart sounds: Normal heart sounds. No murmur. No friction rub. No gallop.   Pulmonary:     Effort: Pulmonary effort is normal. No respiratory distress.     Breath sounds: Normal breath sounds. No stridor.  Abdominal:     General: There is no distension.     Palpations: Abdomen is soft.     Tenderness: There is no abdominal tenderness.  Musculoskeletal:     Right ankle: He exhibits swelling. Tenderness (mild).     Cervical back: He exhibits no bony tenderness.     Thoracic back: He exhibits no bony tenderness.     Lumbar back: He exhibits no bony tenderness.       Legs:     Comments: Clavicle stable. Chest stable to AP/Lat compression. Pelvis stable to Lat compression. No obvious extremity deformity. No chest or abdominal wall contusion.  Skin:    General: Skin is warm.  Neurological:     Mental Status: He is alert and oriented to person, place, and time.     GCS: GCS eye subscore is 4. GCS verbal subscore is 5. GCS motor subscore is 6.     Comments: Moving all extremities      ED Results and Treatments Labs (all labs ordered are listed, but only abnormal results are displayed) Labs Reviewed  COMPREHENSIVE METABOLIC PANEL - Abnormal; Notable for the following components:      Result Value   BUN 25 (*)    Total  Bilirubin 1.8 (*)    All other components within normal limits  LIPASE, BLOOD  CBC  URINALYSIS, ROUTINE W REFLEX MICROSCOPIC                                                                                                                         EKG  EKG Interpretation  Date/Time:    Ventricular Rate:    PR Interval:    QRS Duration:   QT Interval:    QTC Calculation:   R Axis:     Text Interpretation:        Radiology No results  found.  Pertinent labs & imaging results that were available during my care of the patient were reviewed by me and considered in my medical decision making (see chart for details).  Medications Ordered in ED Medications  sodium chloride flush (NS) 0.9 % injection 3 mL (has no administration in time range)                                                                                                                                    Procedures Procedures  (including critical care time)  Medical Decision Making / ED Course I have reviewed the nursing notes for this encounter and the patient's prior records (if available in EHR or on provided paperwork).   LUQMAAN PAKKALA was evaluated in Emergency Department on 04/24/2019 for the symptoms described in the history of present illness. He was evaluated in the context of the global COVID-19 pandemic, which necessitated consideration that the patient might be at risk for infection with the SARS-CoV-2 virus that causes COVID-19. Institutional protocols and algorithms that pertain to the evaluation of patients at risk for COVID-19 are in a state of rapid change based on information released by regulatory bodies including the CDC and federal and state organizations. These policies and algorithms were followed during the patient's care in the ED.  Recurring falls at home. No acute injuries today. No pain during my assessment. Will request home health and PT for patient.  The patient appears reasonably  screened and/or stabilized for discharge and I doubt any other medical condition or other [facility] requiring further screening, evaluation, or treatment in the ED at this time prior to discharge.  The patient is safe for discharge with strict return precautions.       Final Clinical Impression(s) / ED Diagnoses Final diagnoses:  Fall, initial encounter  Sprain of right ankle, unspecified ligament, subsequent encounter    The patient appears reasonably screened and/or stabilized for discharge and I doubt any other medical condition or other [facility] requiring further screening, evaluation, or treatment in the ED at this time prior to discharge.  Disposition: Discharge  Condition: Good  I have discussed the results, Dx and Tx plan with the patient who expressed understanding and agree(s) with the plan. Discharge instructions discussed at great length. The patient was given strict return precautions who verbalized understanding of the instructions. No further questions at time of discharge.    ED Discharge Orders    None       Follow Up: [name], MD [address] [place] [address] [phone]         This chart was dictated using voice recognition software.  Despite best efforts to proofread,  errors can occur which can change the documentation meaning.   Fatima Blank, MD 04/24/19 [phone]

## 2019-04-24 NOTE — Progress Notes (Addendum)
Transportation will be arranged by ED RN CM to get the patient home.  Madilyn Fireman, MSW, LCSW-A Transitions of Care  Clinical Social Worker  [facility]  Medical ICU [phone]

## 2019-04-24 NOTE — Care Management (Signed)
Medicare [place] List with ratings of 4 or more stars.   [place] [phone]   Quality of Patient Care Rating4  out of 5 stars Patient Survey Summary Rating4 out of 5 stars  [place] [phone]   Quality of Patient Care Rating4 out of 5 stars Patient Survey Summary Rating4 out of 5 stars  [place] [phone]   [place] [phone]  [place]  [phone]   [place] to my [place]  out of 5 stars Patient Survey Summary Rating4 out of [place]  [phone]   [place] to my Favorites Quality of Patient Care Rating4 out of 5 stars Patient Survey Summary Rating4 out of 5 stars  [place]  [phone]   [place] to my [place] out of 5 stars Patient Survey Summary Rating4 out of [place]  [phone]   [place] to my Favorites Quality of Patient Care Rating

## 2019-04-24 NOTE — ED Notes (Signed)
Case manager working with patient to help him get home.

## 2019-04-24 NOTE — TOC Transition Note (Signed)
Transition of Care [facility]) - CM/SW Discharge Note   Patient Details  Name: JUAN DORSAINVIL MRN: [id number] Date of Birth: [birth year]  Transition of Care [facility]) CM/SW Contact:  Fuller Mandril, RN Phone Number: 04/24/2019, 12:19 PM   Clinical Narrative:    Jason Coop. Clydene Laming, RN, BSN, [employer] [phone] Spoke with pt at bedside regarding discharge planning for [facility]. Offered pt list of home health agencies to choose from.  Pt chose [facility] to render services as they have availability and accept his insurance. Joen Laura of [facility]  notified. Patient made aware that [facility]  will be in contact in 24-48 hours.  No DME needs identified at this time.    Final next level of care: [place] Barriers to Discharge: Barriers Resolved   Patient Goals and CMS Choice Patient states their goals for this hospitalization and ongoing recovery are:: get moving around better CMS Medicare.gov Compare Post Acute Care list provided to:: Patient Choice offered to / list presented to : Patient  Discharge Placement                       Discharge Plan and Services   Discharge Planning Services: CM Consult Post Acute Care Choice: Home Health                    HH Arranged: PT, RN [facility] Agency: [facility] (formerly [employer]) Date [place]: 04/24/19 Time HH Agency Contacted: 0900 Representative spoke with at [place]: [place] ([place]) Interventions     Readmission Risk Interventions No flowsheet data found.

## 2019-04-24 NOTE — ED Notes (Signed)
Called Education officer, museum and left message.

## 2019-04-24 NOTE — Discharge Instructions (Addendum)
Case management will call you at home to set up home health, physical/occupational therapy.

## 2019-04-24 NOTE — ED Notes (Signed)
Social worker returned call will see patient

## 2019-04-30 ENCOUNTER — Emergency Department (HOSPITAL_COMMUNITY): Payer: Medicare PPO

## 2019-04-30 ENCOUNTER — Encounter (HOSPITAL_COMMUNITY): Payer: Self-pay

## 2019-04-30 ENCOUNTER — Inpatient Hospital Stay (HOSPITAL_COMMUNITY): Payer: Medicare PPO

## 2019-04-30 ENCOUNTER — Inpatient Hospital Stay (HOSPITAL_COMMUNITY): Payer: Medicare PPO | Admitting: Anesthesiology

## 2019-04-30 ENCOUNTER — Encounter (HOSPITAL_COMMUNITY)
Admission: EM | Disposition: A | Discharge status: Skilled Nursing Facility | Payer: Self-pay | Source: Home / Self Care | Attending: Neurology

## 2019-04-30 ENCOUNTER — Inpatient Hospital Stay (HOSPITAL_COMMUNITY)
Admission: EM | Admit: 2019-04-30 | Discharge: 2019-05-24 | DRG: 024 | Disposition: A | Discharge status: Skilled Nursing Facility | Payer: Medicare PPO | Attending: Neurology | Admitting: Neurology

## 2019-04-30 ENCOUNTER — Emergency Department (HOSPITAL_COMMUNITY)
Admission: EM | Admit: 2019-04-30 | Discharge: 2019-04-30 | Discharge status: Absent without leave | Payer: Medicare PPO | Source: Home / Self Care

## 2019-04-30 DIAGNOSIS — G8194 Hemiplegia, unspecified affecting left nondominant side: Secondary | ICD-10-CM | POA: Diagnosis present

## 2019-04-30 DIAGNOSIS — I6389 Other cerebral infarction: Secondary | ICD-10-CM | POA: Diagnosis not present

## 2019-04-30 DIAGNOSIS — R29729 NIHSS score 29: Secondary | ICD-10-CM | POA: Diagnosis present

## 2019-04-30 DIAGNOSIS — Z7982 Long term (current) use of aspirin: Secondary | ICD-10-CM

## 2019-04-30 DIAGNOSIS — E876 Hypokalemia: Secondary | ICD-10-CM | POA: Diagnosis present

## 2019-04-30 DIAGNOSIS — F4323 Adjustment disorder with mixed anxiety and depressed mood: Secondary | ICD-10-CM | POA: Diagnosis present

## 2019-04-30 DIAGNOSIS — Z8261 Family history of arthritis: Secondary | ICD-10-CM

## 2019-04-30 DIAGNOSIS — I10 Essential (primary) hypertension: Secondary | ICD-10-CM | POA: Diagnosis present

## 2019-04-30 DIAGNOSIS — G458 Other transient cerebral ischemic attacks and related syndromes: Secondary | ICD-10-CM | POA: Diagnosis present

## 2019-04-30 DIAGNOSIS — I482 Chronic atrial fibrillation, unspecified: Secondary | ICD-10-CM | POA: Diagnosis present

## 2019-04-30 DIAGNOSIS — M5431 Sciatica, right side: Secondary | ICD-10-CM | POA: Diagnosis present

## 2019-04-30 DIAGNOSIS — Z8673 Personal history of transient ischemic attack (TIA), and cerebral infarction without residual deficits: Secondary | ICD-10-CM

## 2019-04-30 DIAGNOSIS — I639 Cerebral infarction, unspecified: Secondary | ICD-10-CM | POA: Diagnosis present

## 2019-04-30 DIAGNOSIS — K219 Gastro-esophageal reflux disease without esophagitis: Secondary | ICD-10-CM | POA: Diagnosis present

## 2019-04-30 DIAGNOSIS — I4821 Permanent atrial fibrillation: Secondary | ICD-10-CM | POA: Diagnosis not present

## 2019-04-30 DIAGNOSIS — G8190 Hemiplegia, unspecified affecting unspecified side: Secondary | ICD-10-CM | POA: Diagnosis not present

## 2019-04-30 DIAGNOSIS — R471 Dysarthria and anarthria: Secondary | ICD-10-CM | POA: Diagnosis present

## 2019-04-30 DIAGNOSIS — Z9884 Bariatric surgery status: Secondary | ICD-10-CM

## 2019-04-30 DIAGNOSIS — F1510 Other stimulant abuse, uncomplicated: Secondary | ICD-10-CM | POA: Diagnosis present

## 2019-04-30 DIAGNOSIS — G8929 Other chronic pain: Secondary | ICD-10-CM | POA: Diagnosis not present

## 2019-04-30 DIAGNOSIS — E785 Hyperlipidemia, unspecified: Secondary | ICD-10-CM | POA: Diagnosis present

## 2019-04-30 DIAGNOSIS — M549 Dorsalgia, unspecified: Secondary | ICD-10-CM | POA: Diagnosis present

## 2019-04-30 DIAGNOSIS — I6601 Occlusion and stenosis of right middle cerebral artery: Secondary | ICD-10-CM | POA: Diagnosis present

## 2019-04-30 DIAGNOSIS — R4701 Aphasia: Secondary | ICD-10-CM | POA: Diagnosis present

## 2019-04-30 DIAGNOSIS — I495 Sick sinus syndrome: Secondary | ICD-10-CM | POA: Diagnosis present

## 2019-04-30 DIAGNOSIS — M5441 Lumbago with sciatica, right side: Secondary | ICD-10-CM | POA: Diagnosis not present

## 2019-04-30 DIAGNOSIS — H518 Other specified disorders of binocular movement: Secondary | ICD-10-CM | POA: Diagnosis present

## 2019-04-30 DIAGNOSIS — Z888 Allergy status to other drugs, medicaments and biological substances status: Secondary | ICD-10-CM

## 2019-04-30 DIAGNOSIS — Z7289 Other problems related to lifestyle: Secondary | ICD-10-CM | POA: Diagnosis not present

## 2019-04-30 DIAGNOSIS — R1310 Dysphagia, unspecified: Secondary | ICD-10-CM | POA: Diagnosis present

## 2019-04-30 DIAGNOSIS — D72829 Elevated white blood cell count, unspecified: Secondary | ICD-10-CM | POA: Diagnosis present

## 2019-04-30 DIAGNOSIS — Z91138 Patient's unintentional underdosing of medication regimen for other reason: Secondary | ICD-10-CM

## 2019-04-30 DIAGNOSIS — I69391 Dysphagia following cerebral infarction: Secondary | ICD-10-CM

## 2019-04-30 DIAGNOSIS — F17210 Nicotine dependence, cigarettes, uncomplicated: Secondary | ICD-10-CM | POA: Diagnosis present

## 2019-04-30 DIAGNOSIS — Z7901 Long term (current) use of anticoagulants: Secondary | ICD-10-CM

## 2019-04-30 DIAGNOSIS — Z6841 Body Mass Index (BMI) 40.0 and over, adult: Secondary | ICD-10-CM

## 2019-04-30 DIAGNOSIS — I63411 Cerebral infarction due to embolism of right middle cerebral artery: Secondary | ICD-10-CM | POA: Diagnosis present

## 2019-04-30 DIAGNOSIS — G894 Chronic pain syndrome: Secondary | ICD-10-CM | POA: Diagnosis present

## 2019-04-30 DIAGNOSIS — R29810 Facial weakness: Secondary | ICD-10-CM | POA: Diagnosis present

## 2019-04-30 DIAGNOSIS — R001 Bradycardia, unspecified: Secondary | ICD-10-CM

## 2019-04-30 DIAGNOSIS — J449 Chronic obstructive pulmonary disease, unspecified: Secondary | ICD-10-CM | POA: Diagnosis present

## 2019-04-30 DIAGNOSIS — I445 Left posterior fascicular block: Secondary | ICD-10-CM | POA: Diagnosis present

## 2019-04-30 DIAGNOSIS — Z4659 Encounter for fitting and adjustment of other gastrointestinal appliance and device: Secondary | ICD-10-CM

## 2019-04-30 DIAGNOSIS — I5022 Chronic systolic (congestive) heart failure: Secondary | ICD-10-CM | POA: Diagnosis not present

## 2019-04-30 DIAGNOSIS — Z20828 Contact with and (suspected) exposure to other viral communicable diseases: Secondary | ICD-10-CM | POA: Diagnosis present

## 2019-04-30 DIAGNOSIS — I4819 Other persistent atrial fibrillation: Secondary | ICD-10-CM | POA: Diagnosis not present

## 2019-04-30 DIAGNOSIS — G9389 Other specified disorders of brain: Secondary | ICD-10-CM | POA: Diagnosis present

## 2019-04-30 DIAGNOSIS — R319 Hematuria, unspecified: Secondary | ICD-10-CM | POA: Diagnosis not present

## 2019-04-30 DIAGNOSIS — I6300 Cerebral infarction due to thrombosis of unspecified precerebral artery: Secondary | ICD-10-CM | POA: Diagnosis not present

## 2019-04-30 DIAGNOSIS — I739 Peripheral vascular disease, unspecified: Secondary | ICD-10-CM | POA: Diagnosis present

## 2019-04-30 DIAGNOSIS — F17200 Nicotine dependence, unspecified, uncomplicated: Secondary | ICD-10-CM | POA: Diagnosis not present

## 2019-04-30 DIAGNOSIS — G4733 Obstructive sleep apnea (adult) (pediatric): Secondary | ICD-10-CM | POA: Diagnosis present

## 2019-04-30 DIAGNOSIS — Z9181 History of falling: Secondary | ICD-10-CM

## 2019-04-30 DIAGNOSIS — Z79891 Long term (current) use of opiate analgesic: Secondary | ICD-10-CM

## 2019-04-30 DIAGNOSIS — Z801 Family history of malignant neoplasm of trachea, bronchus and lung: Secondary | ICD-10-CM

## 2019-04-30 DIAGNOSIS — T45516A Underdosing of anticoagulants, initial encounter: Secondary | ICD-10-CM | POA: Diagnosis present

## 2019-04-30 DIAGNOSIS — Z8249 Family history of ischemic heart disease and other diseases of the circulatory system: Secondary | ICD-10-CM

## 2019-04-30 DIAGNOSIS — I509 Heart failure, unspecified: Secondary | ICD-10-CM

## 2019-04-30 DIAGNOSIS — R1312 Dysphagia, oropharyngeal phase: Secondary | ICD-10-CM | POA: Diagnosis not present

## 2019-04-30 DIAGNOSIS — K660 Peritoneal adhesions (postprocedural) (postinfection): Secondary | ICD-10-CM | POA: Diagnosis present

## 2019-04-30 DIAGNOSIS — Z79899 Other long term (current) drug therapy: Secondary | ICD-10-CM

## 2019-04-30 DIAGNOSIS — Z808 Family history of malignant neoplasm of other organs or systems: Secondary | ICD-10-CM

## 2019-04-30 HISTORY — PX: IR PERCUTANEOUS ART THROMBECTOMY/INFUSION INTRACRANIAL INC DIAG ANGIO: IMG6087

## 2019-04-30 HISTORY — PX: RADIOLOGY WITH ANESTHESIA: SHX6223

## 2019-04-30 HISTORY — PX: IR ANGIO VERTEBRAL SEL SUBCLAVIAN INNOMINATE UNI R MOD SED: IMG5365

## 2019-04-30 HISTORY — PX: IR CT HEAD LTD: IMG2386

## 2019-04-30 LAB — COMPREHENSIVE METABOLIC PANEL
ALT: 16 U/L (ref 0–44)
AST: 24 U/L (ref 15–41)
Albumin: 3.5 g/dL (ref 3.5–5.0)
Alkaline Phosphatase: 77 U/L (ref 38–126)
Anion gap: 14 (ref 5–15)
BUN: 18 mg/dL (ref 8–23)
CO2: 23 mmol/L (ref 22–32)
Calcium: 9.4 mg/dL (ref 8.9–10.3)
Chloride: 102 mmol/L (ref 98–111)
Creatinine, Ser: 0.89 mg/dL (ref 0.61–1.24)
GFR calc Af Amer: >60 mL/min (ref >60)
GFR calc non Af Amer: >60 mL/min (ref >60)
Glucose, Bld: 113 mg/dL — ABNORMAL HIGH (ref 70–99)
Potassium: 3.6 mmol/L (ref 3.5–5.1)
Sodium: 139 mmol/L (ref 135–145)
Total Bilirubin: 1 mg/dL (ref 0.3–1.2)
Total Protein: 7.2 g/dL (ref 6.5–8.1)

## 2019-04-30 LAB — I-STAT CHEM 8, ED
BUN: 21 mg/dL (ref 8–23)
Calcium, Ion: 1.23 mmol/L (ref 1.15–1.40)
Chloride: 102 mmol/L (ref 98–111)
Creatinine, Ser: 0.8 mg/dL (ref 0.61–1.24)
Glucose, Bld: 109 mg/dL — ABNORMAL HIGH (ref 70–99)
HCT: 49 % (ref 39.0–52.0)
Hemoglobin: 16.7 g/dL (ref 13.0–17.0)
Potassium: 3.6 mmol/L (ref 3.5–5.1)
Sodium: 140 mmol/L (ref 135–145)
TCO2: 28 mmol/L (ref 22–32)

## 2019-04-30 LAB — ECHOCARDIOGRAM COMPLETE

## 2019-04-30 LAB — DIFFERENTIAL
Abs Immature Granulocytes: 0.02 10*3/uL (ref 0.00–0.07)
Basophils Absolute: 0.1 10*3/uL (ref 0.0–0.1)
Basophils Relative: 1 %
Eosinophils Absolute: 0.1 10*3/uL (ref 0.0–0.5)
Eosinophils Relative: 1 %
Immature Granulocytes: 0 %
Lymphocytes Relative: 14 %
Lymphs Abs: 1.3 10*3/uL (ref 0.7–4.0)
Monocytes Absolute: 0.8 10*3/uL (ref 0.1–1.0)
Monocytes Relative: 8 %
Neutro Abs: 7 10*3/uL (ref 1.7–7.7)
Neutrophils Relative %: 76 %

## 2019-04-30 LAB — CBC
HCT: 48 % (ref 39.0–52.0)
Hemoglobin: 15.5 g/dL (ref 13.0–17.0)
MCH: 27.6 pg (ref 26.0–34.0)
MCHC: 32.3 g/dL (ref 30.0–36.0)
MCV: 85.6 fL (ref 80.0–100.0)
Platelets: 241 10*3/uL (ref 150–400)
RBC: 5.61 MIL/uL (ref 4.22–5.81)
RDW: 15.1 % (ref 11.5–15.5)
WBC: 9.2 10*3/uL (ref 4.0–10.5)
nRBC: 0 % (ref 0.0–0.2)

## 2019-04-30 LAB — URINALYSIS, ROUTINE W REFLEX MICROSCOPIC
Bilirubin Urine: NEGATIVE
Glucose, UA: NEGATIVE mg/dL
Hgb urine dipstick: NEGATIVE
Ketones, ur: NEGATIVE mg/dL
Leukocytes,Ua: NEGATIVE
Nitrite: NEGATIVE
Protein, ur: NEGATIVE mg/dL
Specific Gravity, Urine: >1.046 — ABNORMAL HIGH (ref 1.005–1.030)
pH: 6 (ref 5.0–8.0)

## 2019-04-30 LAB — GLUCOSE, CAPILLARY
Glucose-Capillary: 108 mg/dL — ABNORMAL HIGH (ref 70–99)
Glucose-Capillary: 110 mg/dL — ABNORMAL HIGH (ref 70–99)

## 2019-04-30 LAB — HIV ANTIBODY (ROUTINE TESTING W REFLEX): HIV Screen 4th Generation wRfx: NONREACTIVE

## 2019-04-30 LAB — PROTIME-INR
INR: 1.1 (ref 0.8–1.2)
Prothrombin Time: 14.1 seconds (ref 11.4–15.2)

## 2019-04-30 LAB — RAPID URINE DRUG SCREEN, HOSP PERFORMED
Amphetamines: POSITIVE — AB
Barbiturates: NOT DETECTED
Benzodiazepines: NOT DETECTED
Cocaine: NOT DETECTED
Opiates: NOT DETECTED
Tetrahydrocannabinol: NOT DETECTED

## 2019-04-30 LAB — ETHANOL: Alcohol, Ethyl (B): <10 mg/dL (ref <10)

## 2019-04-30 LAB — SARS CORONAVIRUS 2 BY RT PCR (HOSPITAL ORDER, PERFORMED IN ~~LOC~~ HOSPITAL LAB): SARS Coronavirus 2: NEGATIVE

## 2019-04-30 LAB — APTT: aPTT: 35 seconds (ref 24–36)

## 2019-04-30 LAB — MRSA PCR SCREENING: MRSA by PCR: NEGATIVE

## 2019-04-30 IMAGING — DX DG CHEST 1V PORT
1 series · 1 of 1 positions shown · non-contrast
Comparison: [DATE]

CLINICAL DATA: Stroke

EXAM:
PORTABLE CHEST 1 VIEW

[chest]
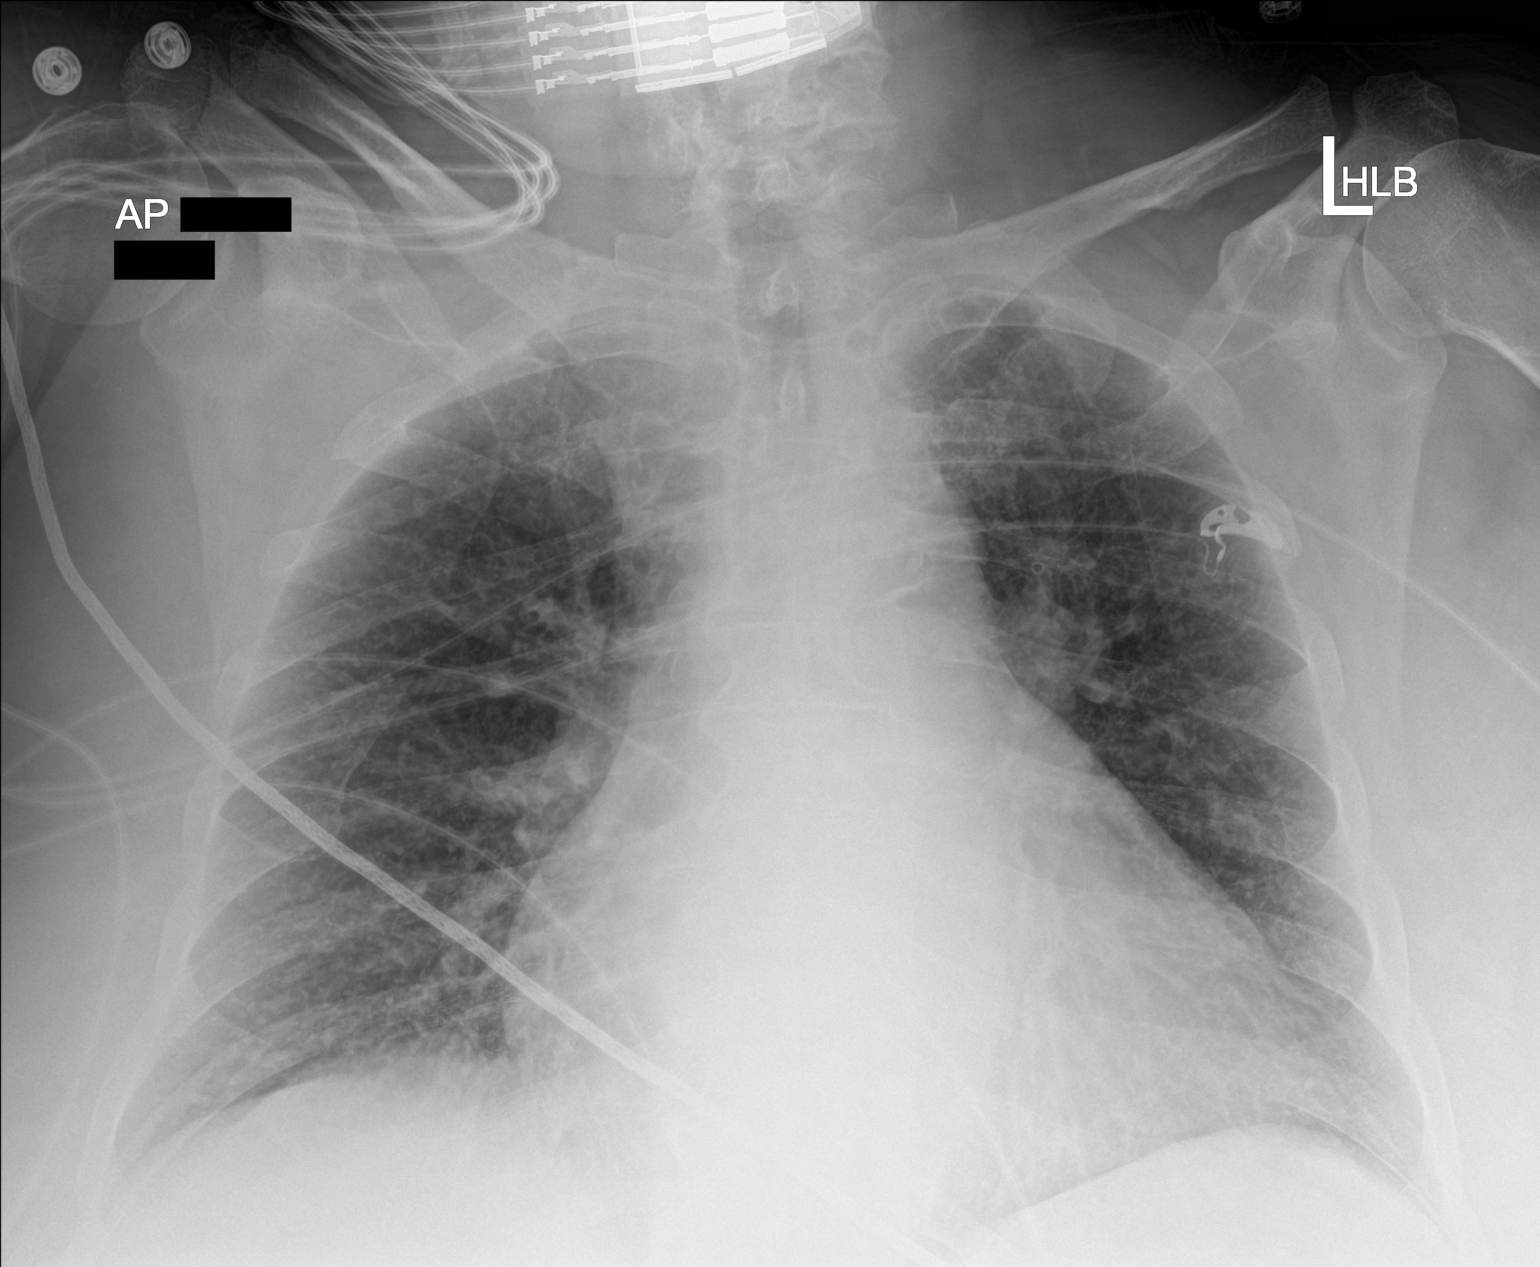

[1 of 1 positions shown; findings below may reference images not displayed]

FINDINGS: Cardiomegaly. Both lungs are clear. The visualized skeletal
structures are unremarkable.
IMPRESSION: Cardiomegaly without acute abnormality of the lungs in AP portable
projection.

## 2019-04-30 IMAGING — CT CT HEAD CODE STROKE
4 series · 16 of 47 positions shown, 18 images · non-contrast
Comparison: None.

CLINICAL DATA: Code stroke.  Left-sided weakness.

EXAM:
CT HEAD WITHOUT CONTRAST
TECHNIQUE: Contiguous axial images were obtained from the base of the skull
through the vertex without intravenous contrast.

[Series 3: head wo · axial · 0.50mm/px · z∈[-143,-23]mm · 7 of 33 slices shown, 9 images]
[im 5/33  brain]
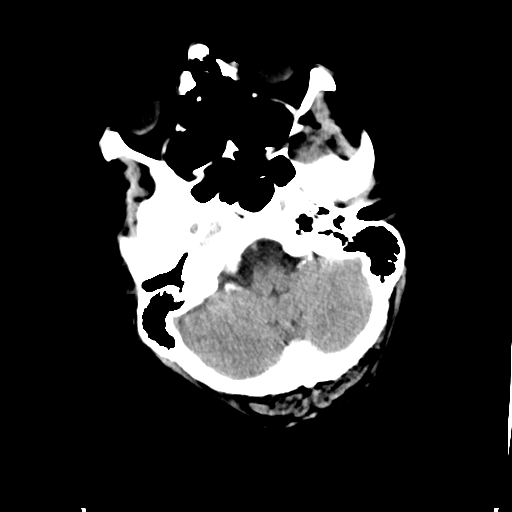
[im 5/33  bone]
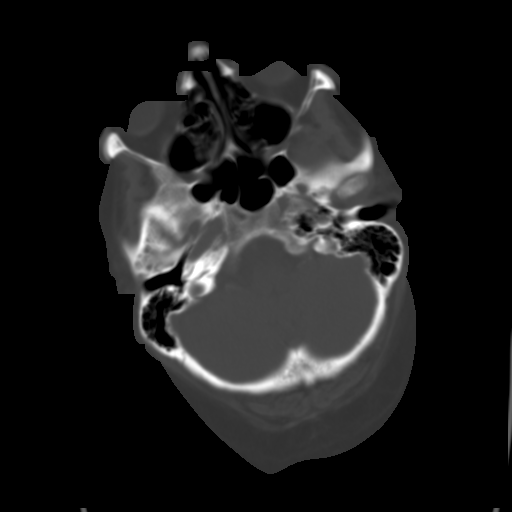
[im 9/33  brain]
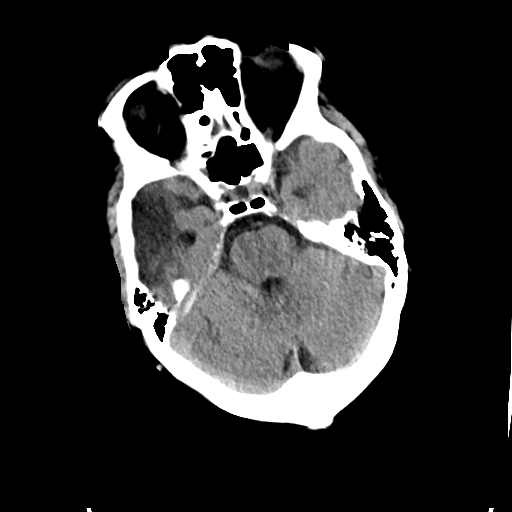
[im 13/33  brain]
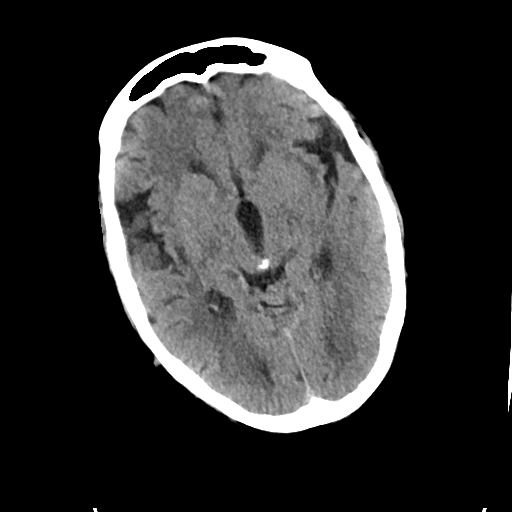
[im 17/33  brain]
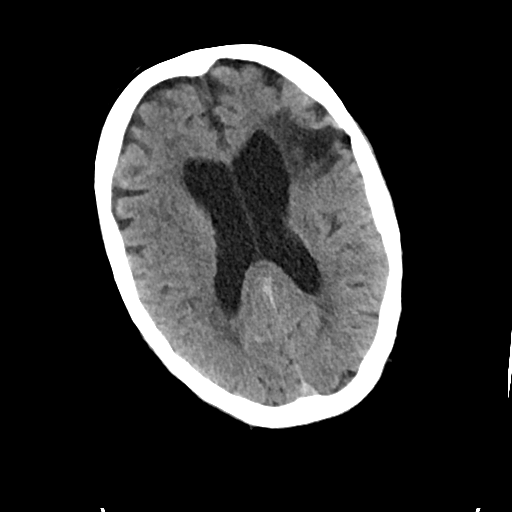
[im 21/33  brain]
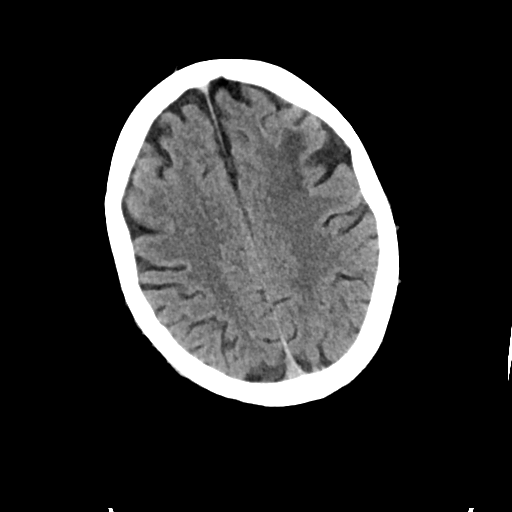
[im 21/33  bone]
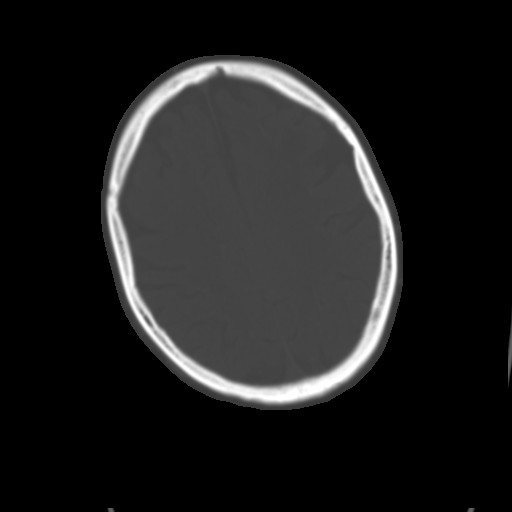
[im 25/33  brain]
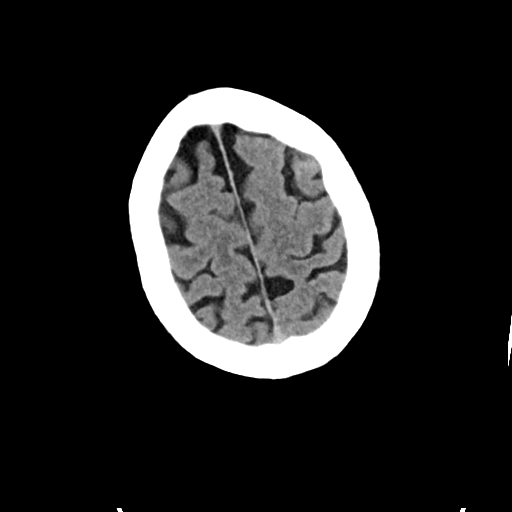
[im 29/33  brain]
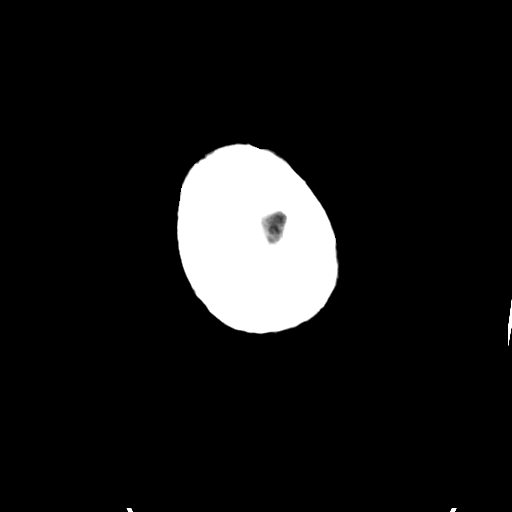

[Series 4: head bone · axial · 0.50mm/px · z∈[-147,-115]mm · 3 of 82 slices shown]
[im 9/82  bone]
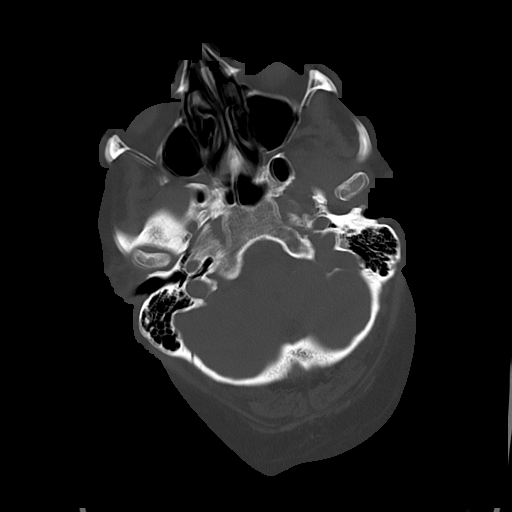
[im 17/82  bone]
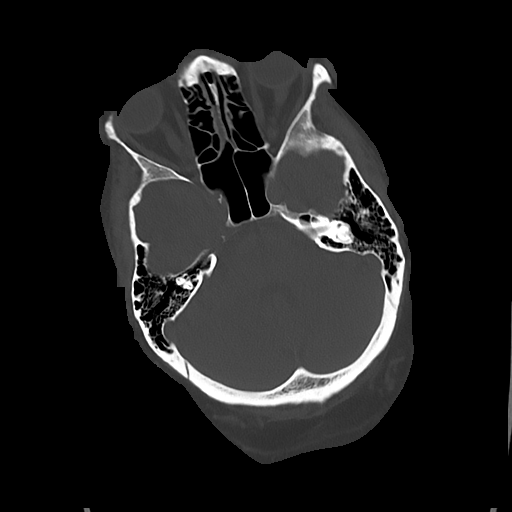
[im 25/82  bone]
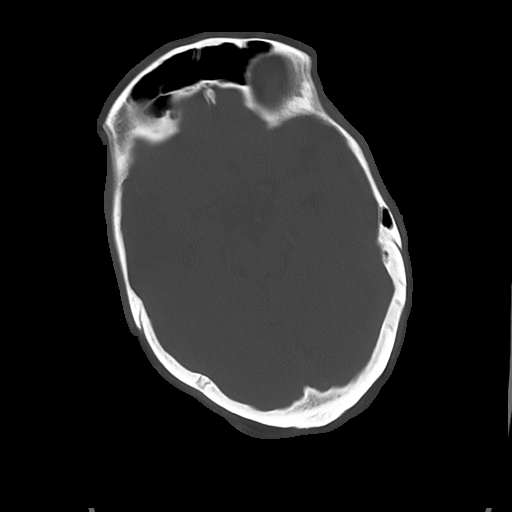

[Series 5: cor soft · coronal · 0.36mm/px · 3 of 77 slices shown]
[im 26/77  brain]
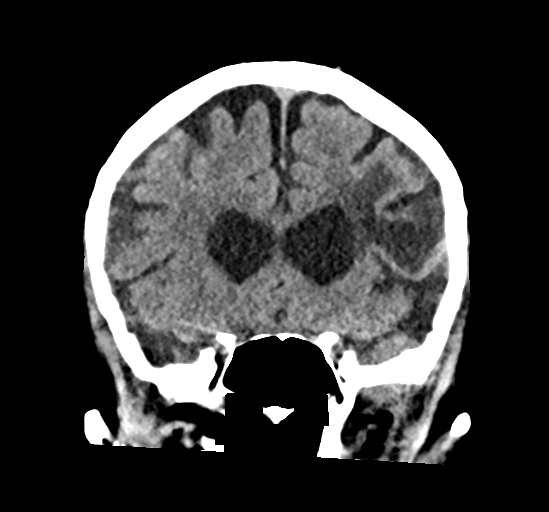
[im 34/77  brain]
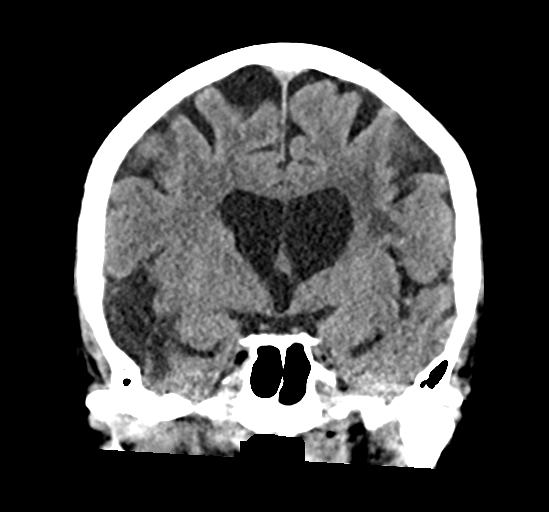
[im 43/77  brain]
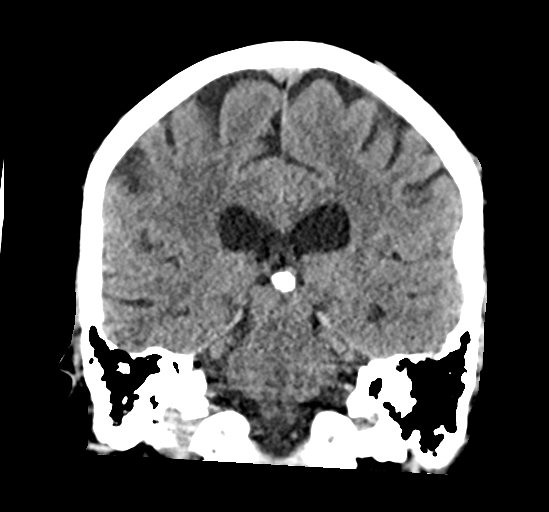

[Series 6: sag soft · sagittal · 0.37mm/px · 3 of 66 slices shown]
[im 22/66  brain]
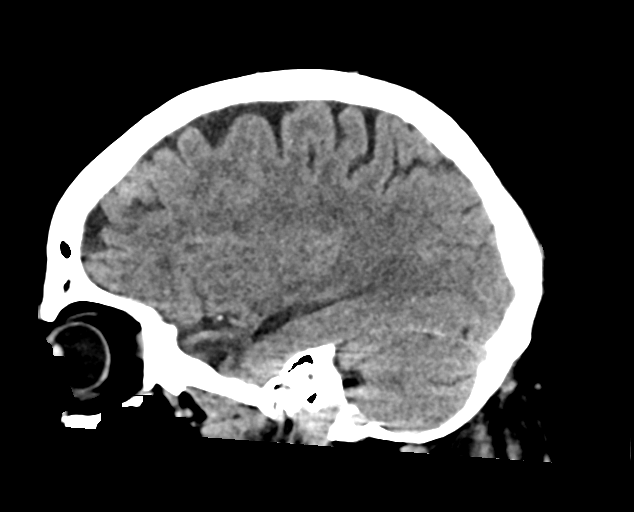
[im 33/66  brain]
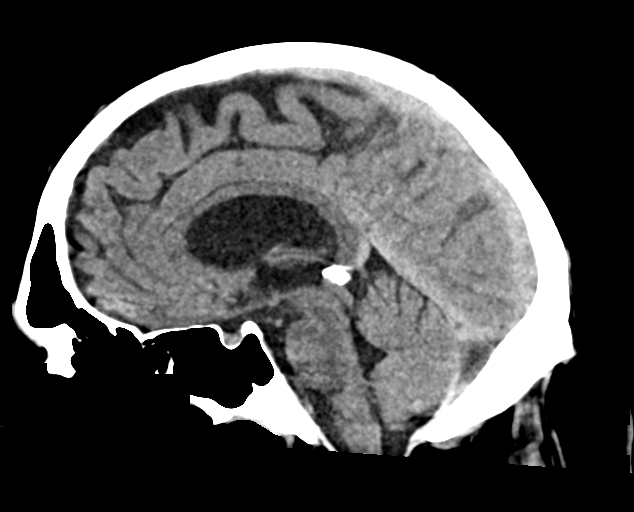
[im 44/66  brain]
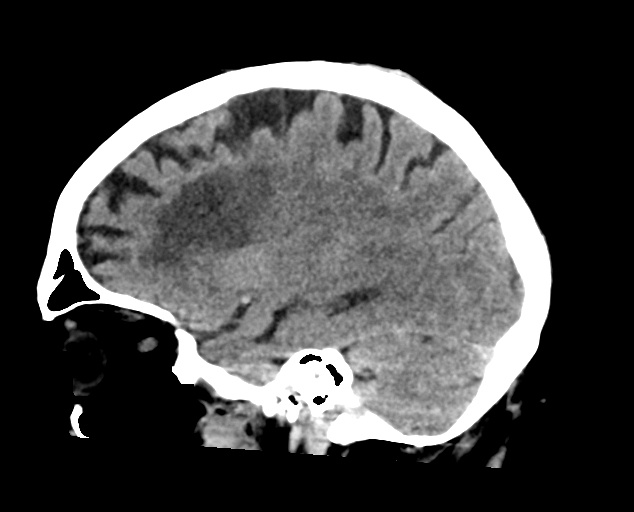

[16 of 47 positions shown; findings below may reference images not displayed]

FINDINGS: Brain: Chronic infarct right lateral temporal lobe. Chronic infarct
left frontal lobe. Chronic infarct anterior limb internal capsule on
the left.

Negative for acute infarct. Negative for hemorrhage or mass. Mild
ventricular enlargement most likely due to mild atrophy.

Vascular: Hyperdense right MCA at the bifurcation. Probable acute
thrombus.

Skull: Negative

Sinuses/Orbits: Cyst left maxillary sinus otherwise sinuses clear.
Negative orbit.

Other: None

ASPECTS (Alberta Stroke Program Early CT Score)

- Ganglionic level infarction (caudate, lentiform nuclei, internal
capsule, insula, M1-M3 cortex): 7

- Supraganglionic infarction (M4-M6 cortex): 3

Total score (0-10 with 10 being normal): 10
IMPRESSION: 1. Probable hyperdense right MCA bifurcation.  No acute infarct.
2. Atrophy with chronic right temporal infarct and chronic left
frontal infarct.
3. ASPECTS is 10
4. Results texted to Dr. BADAL

## 2019-04-30 IMAGING — XA IR  CT HEAD LIMITED
3 series · 12 of 24 positions shown · IV contrast (IODINE)
Comparison: CT angiogram of the head and neck [DATE].

INDICATION: Acute onset of left-sided weakness, right gaze deviation. Occluded
right middle cerebral artery M1 segment on CT angiogram of the head
and neck.

EXAM:
1. EMERGENT LARGE VESSEL OCCLUSION THROMBOLYSIS (anterior
CIRCULATION)
TECHNIQUE: An emergent 2 physician consent was obtained. No family members or
next of SUWANDOKO were available physically or by phone. Patient was
unable to provide consent on account of his medical condition.

[Series 13: <mpr range>2 · axial · 0.5mm · 0.34mm/px · z∈[-144,-74]mm · 2 of 29 slices shown]
[im 8/29]
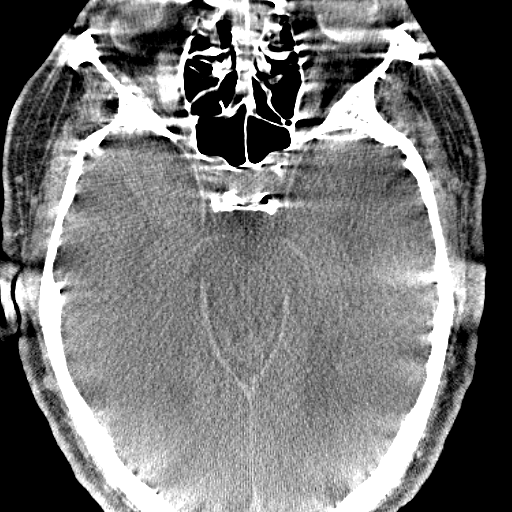
[im 22/29]
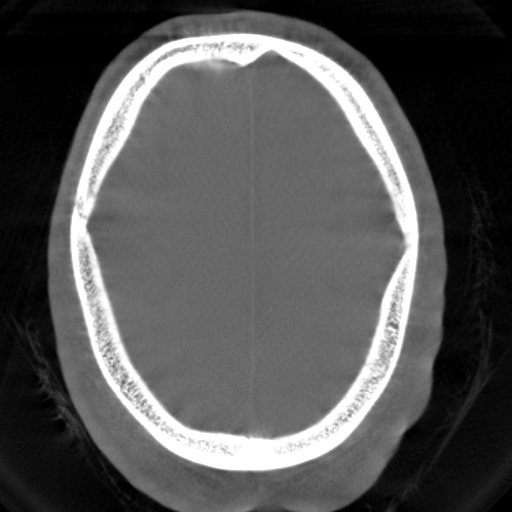

[Series 13: <mpr range>3 · axial · 0.5mm · 0.41mm/px · z∈[-206,-14]mm · 3 of 34 slices shown]
[im 1/34]
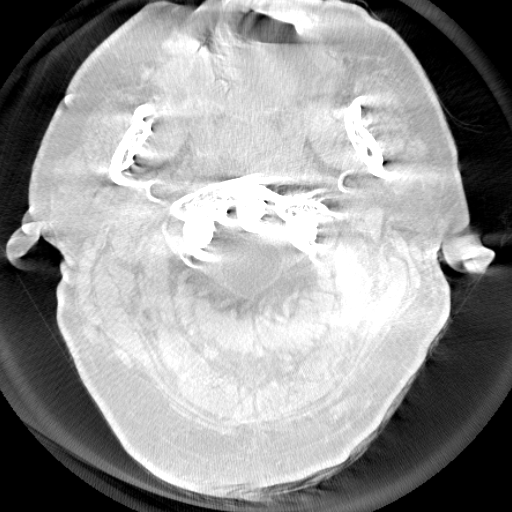
[im 17/34]
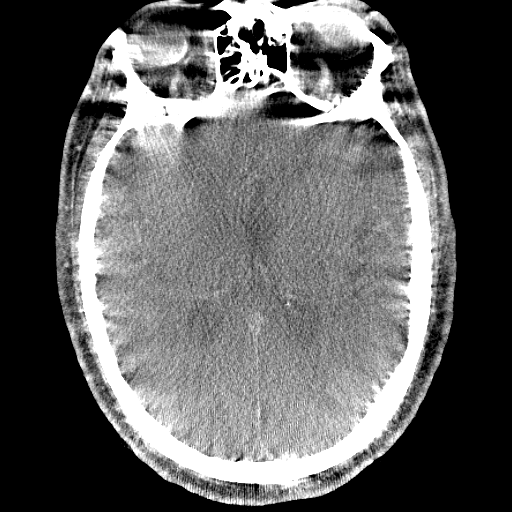
[im 34/34]
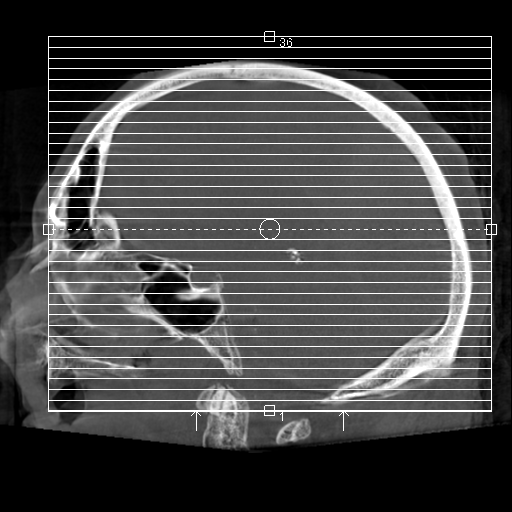

[Series 300: dr. (person_name) · 7 of 87 slices shown]
[im 7/87]
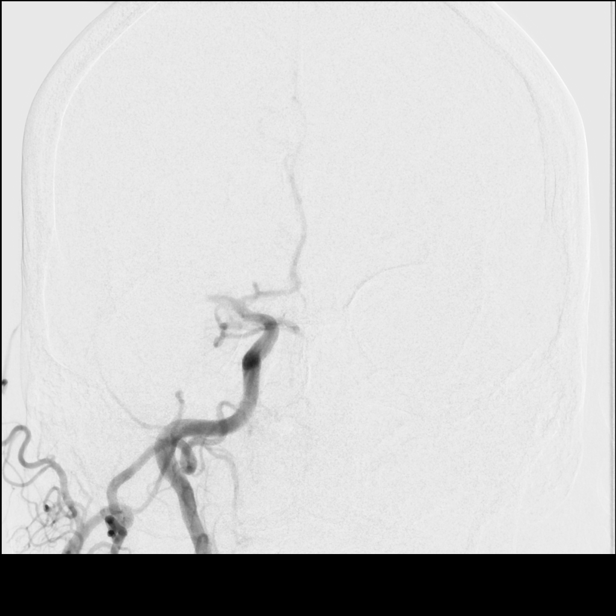
[im 20/87]
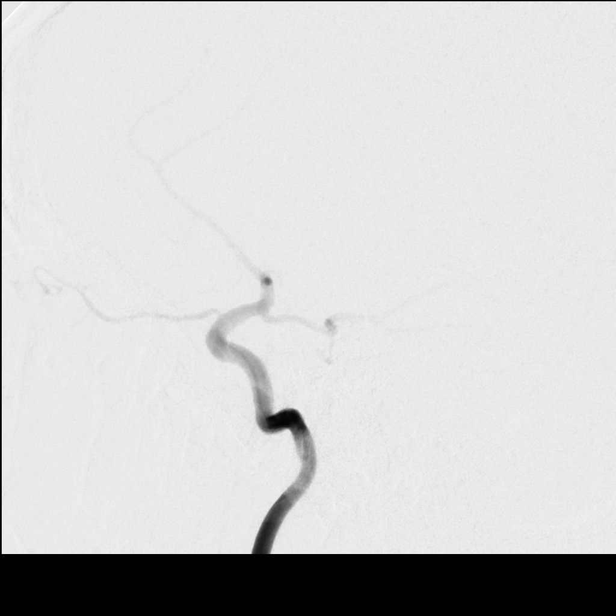
[im 34/87]
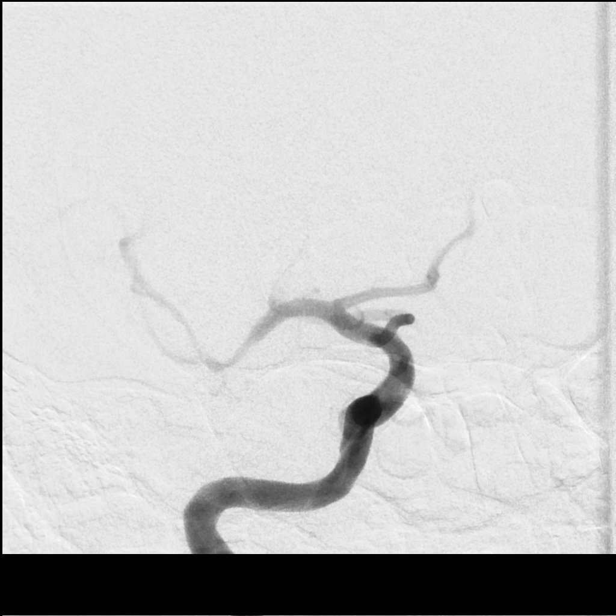
[im 47/87]
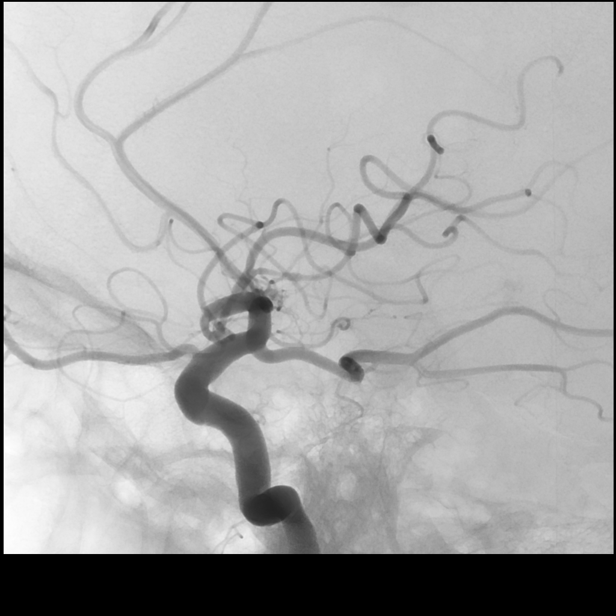
[im 60/87]
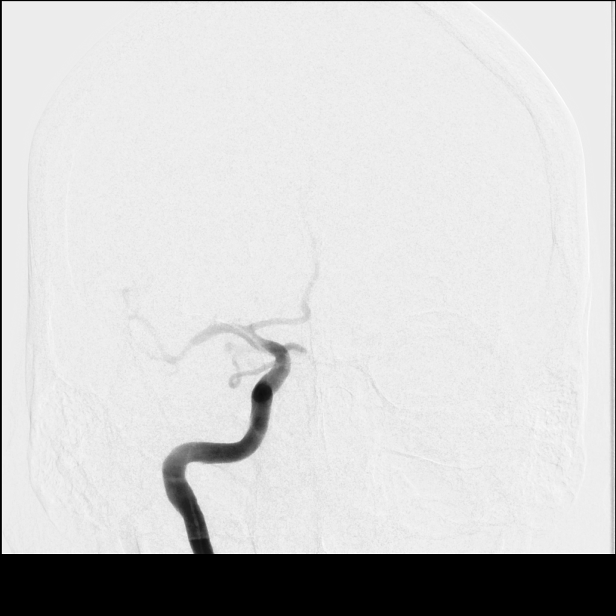
[im 73/87]
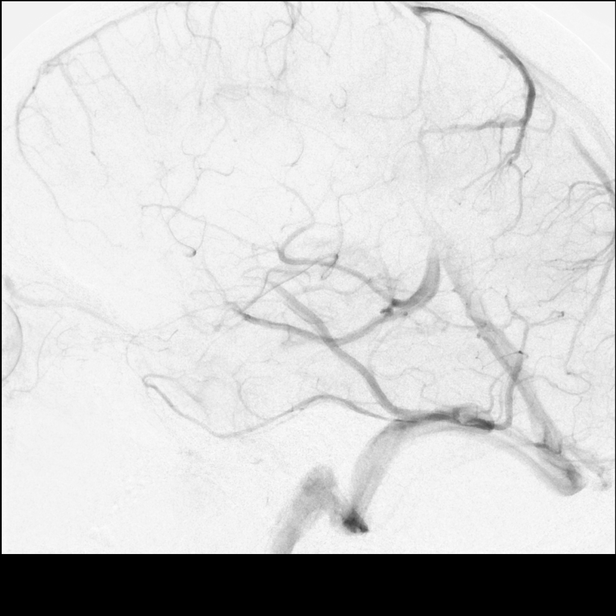
[im 87/87]
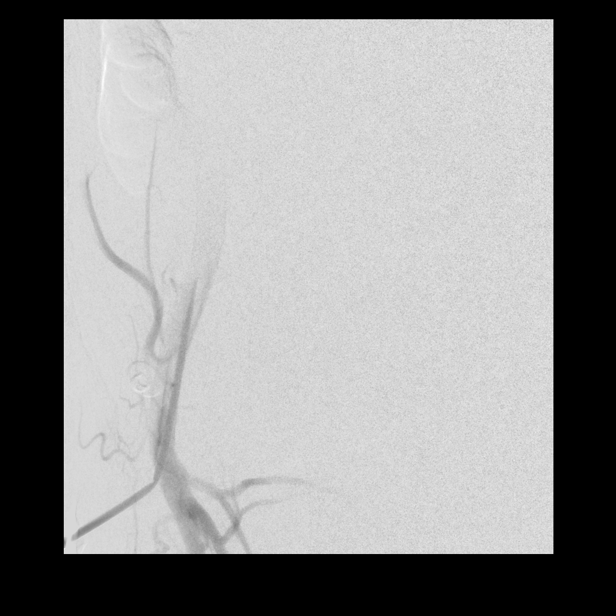

[12 of 24 positions shown; findings below may reference images not displayed]

MEDICATIONS:
Vancomycin IV antibiotic was administered within 1 hour of the
procedure

ANESTHESIA/SEDATION:
General anesthesia.

CONTRAST:  Isovue 300 approximately 60 mL.

FLUOROSCOPY TIME:  Fluoroscopy Time: 20 minutes 30 seconds ([BS]
mGy).

COMPLICATIONS:
None immediate.
The patient was then put under general anesthesia by the [REDACTED] at [HOSPITAL].

The right groin was prepped and draped in the usual sterile fashion.
Thereafter using modified Seldinger technique, transfemoral access
into the right common femoral artery was obtained without
difficulty. Over a 0.035 inch guidewire a 5 French Pinnacle sheath
was inserted. Through this, and also over a 0.035 inch guidewire a 5
French JB 1 catheter was advanced to the aortic arch region and
selectively positioned in the innominate artery, and the left common
carotid artery.
FINDINGS: The innominate artery angiogram demonstrates wide patency of the
right subclavian artery and the right common carotid artery
proximally.

The right vertebral artery at its origin and distally appears widely
patent to the cranial skull base.

The right common carotid arteriogram demonstrates the right external
carotid artery and its major branches to be widely patent.

The right internal carotid artery at the bulb has a smooth shallow
plaque along its posterior superior aspect without evidence of
intraluminal filling defects or of ulcerations.

The vessel is, otherwise, seen to opacify to the cranial skull base.
The petrous, cavernous and supraclinoid segments are widely patent.

The right posterior communicating artery is seen opacifying the
right posterior cerebral artery distribution.

The right anterior cerebral artery opacifies into the capillary and
venous phases.

Complete angiographic occlusion is seen of the right middle cerebral
artery mid M1 segment.

The delayed arterial run demonstrates partial retrograde
opacification of the right middle cerebral artery anterior
perisylvian branches from collaterals arising from the pericallosal
and the callosal marginal branches.

PROCEDURE:
The diagnostic JB 1 catheter in the right common carotid artery was
exchanged over a 0.035 inch 300 cm Rosen exchange guidewire for an 8
French Pinnacle sheath which was connected to continuous heparinized
saline infusion.

Over the Rosen exchange guidewire, an 087 balloon guide catheter
which had been prepped with 50% contrast and 50% heparinized saline
infusion was then advanced to the mid right ICA without difficulty.
The guidewire was removed. Good aspiration obtained from the hub of
the balloon guide catheter. A control arteriogram performed through
the balloon guide catheter demonstrated no change in the
intracranial circulation.

Over a 0.014 inch standard Synchro micro guidewire which had a J
configuration at its tip, an 021 Trevo ProVue microcatheter inside
of a 6 French 132 cm Catalyst guide catheter was advanced to the
supraclinoid right ICA without difficulty. The micro guidewire was
then gently advanced using a torque device through the occluded
right middle cerebral artery into the M2 M3 inferior division.

The guidewire was removed. Good aspiration obtained from the
microcatheter which had been advanced into the M2 M3 region. Gentle
contrast injection demonstrated safe position of tip of the
microcatheter.

This was then connected to continuous heparinized saline infusion.

A 4 mm x 40 mm Solitaire X retrieval device was then advanced to the
distal end of the microcatheter. The O ring on the delivery
microcatheter was then loosened. With slight forward gentle traction
with the right hand on the delivery micro guidewire, with the left
hand the microcatheter was retrieved unsheathing distally and then
the proximal portion of the retrieval device.

The 6 French Catalyst guide catheter was then advanced to the
occluded proximal portion of the middle cerebral artery.

With proximal flow arrest in the right internal carotid artery, and
aspiration through the Penumbra aspirate catheter at the hub of the
6 French Catalyst guide catheter and the proximal portion of the
occluded right middle cerebral artery over 2 minutes, the
combination of the retrieval device, the microcatheter and the 6
French Catalyst guide catheter were retrieved and removed. Following
flow arrest reversal, a control arteriogram performed through the
balloon guide catheter in the right internal carotid artery
demonstrated now complete angiographic revascularization of the
right middle cerebral artery and the bifurcation branches.

There continued to be hypoperfusion in the anterior [DATE] of the
perisylvian triangle.

However, delayed arterial injection revealed retrograde
opacification to this segment of the middle cerebral artery probably
representing partially occluded superior division of the right
middle cerebral artery on a chronic basis as per the old infarcts
seen in this distribution on the CT scan. Spasm noted in the
inferior division of the right middle cerebral artery was treated
successfully with 3 aliquots of 25 mcg of nitroglycerin with relief.

A final control arteriogram performed through the right common
carotid artery demonstrated unimpeded flow through the right
internal carotid artery cranial skull base and the right middle and
the right anterior cerebral arteries. The right posterior
communicating artery remained patent.

No change was seen in the revascularized right middle cerebral
artery achieving a TICI 2c revascularization.

The balloon guide catheter was removed. The 8 French Pinnacle sheath
in the right groin was then removed with hemostasis achieved with an
8 French Angio-Seal closure device. Distal pulses remained
Dopplerable in the dorsalis pedis, and the posterior tibial regions
bilaterally.

CT scan of the brain obtained demonstrated no evidence of mass
effect, midline shift or intracranial hemorrhage.

The patient was then extubated without difficulty. Upon partial
recovery, the patient moved his right arm and leg and the distal
left foot spontaneously.

He was then returned to the neuro ICU for post thrombectomy
management.
IMPRESSION: Status post endovascular complete revascularization of the right
middle cerebral M1 segment with 1 pass using the 4 mm x 40 mm
Solitaire X retrieval device and Penumbra proximal aspiration of the
clot achieving a TICI 2c revascularization.

PLAN:
As per referring SUWANDOKO.

## 2019-04-30 SURGERY — RADIOLOGY WITH ANESTHESIA
Anesthesia: General

## 2019-04-30 MED ORDER — SODIUM CHLORIDE 0.9 % IV SOLN
INTRAVENOUS | Status: DC
  Administered 2019-04-30 – 2019-05-02 (×3): via INTRAVENOUS

## 2019-04-30 MED ORDER — LIDOCAINE HCL (CARDIAC) PF 100 MG/5ML IV SOSY
PREFILLED_SYRINGE | INTRAVENOUS | Status: DC | PRN
  Administered 2019-04-30: 30 mg via INTRAVENOUS

## 2019-04-30 MED ORDER — PANTOPRAZOLE SODIUM 40 MG IV SOLR
40.0000 mg | Freq: Every day | INTRAVENOUS | Status: DC
  Administered 2019-04-30 – 2019-05-01 (×2): 40 mg via INTRAVENOUS
  Filled 2019-04-30 (×2): qty 40

## 2019-04-30 MED ORDER — ASPIRIN 300 MG RE SUPP
300.0000 mg | Freq: Every day | RECTAL | Status: DC
  Administered 2019-04-30 – 2019-05-02 (×3): 300 mg via RECTAL
  Filled 2019-04-30 (×3): qty 1

## 2019-04-30 MED ORDER — SUCCINYLCHOLINE CHLORIDE 20 MG/ML IJ SOLN
INTRAMUSCULAR | Status: DC | PRN
  Administered 2019-04-30: 160 mg via INTRAVENOUS

## 2019-04-30 MED ORDER — PROPOFOL 10 MG/ML IV BOLUS
INTRAVENOUS | Status: DC | PRN
  Administered 2019-04-30: 150 mg via INTRAVENOUS

## 2019-04-30 MED ORDER — ACETAMINOPHEN 325 MG PO TABS: 650.0000 mg | ORAL_TABLET | ORAL | Status: DC | PRN

## 2019-04-30 MED ORDER — CEFAZOLIN SODIUM-DEXTROSE 2-3 GM-%(50ML) IV SOLR
INTRAVENOUS | Status: DC | PRN
  Administered 2019-04-30: 2 g via INTRAVENOUS

## 2019-04-30 MED ORDER — IOPAMIDOL (ISOVUE-300) INJECTION 61%
150.0000 mL | Freq: Once | INTRAVENOUS | Status: AC | PRN
  Administered 2019-04-30: 72 mL via INTRA_ARTERIAL

## 2019-04-30 MED ORDER — ASPIRIN EC 325 MG PO TBEC: 325.0000 mg | DELAYED_RELEASE_TABLET | Freq: Every day | ORAL | Status: DC

## 2019-04-30 MED ORDER — SODIUM CHLORIDE 0.9% FLUSH
3.0000 mL | Freq: Once | INTRAVENOUS | Status: AC
  Administered 2019-04-30: 15:00:00 3 mL via INTRAVENOUS

## 2019-04-30 MED ORDER — LACTATED RINGERS IV SOLN
INTRAVENOUS | Status: DC | PRN
  Administered 2019-04-30: 12:00:00 via INTRAVENOUS

## 2019-04-30 MED ORDER — ACETAMINOPHEN 650 MG RE SUPP: 650.0000 mg | RECTAL | Status: DC | PRN

## 2019-04-30 MED ORDER — FENTANYL CITRATE (PF) 100 MCG/2ML IJ SOLN
INTRAMUSCULAR | Status: DC | PRN
  Administered 2019-04-30: 100 ug via INTRAVENOUS

## 2019-04-30 MED ORDER — IOHEXOL 350 MG/ML SOLN
100.0000 mL | Freq: Once | INTRAVENOUS | Status: AC | PRN
  Administered 2019-04-30: 100 mL via INTRAVENOUS

## 2019-04-30 MED ORDER — ENOXAPARIN SODIUM 40 MG/0.4ML ~~LOC~~ SOLN
40.0000 mg | SUBCUTANEOUS | Status: DC
  Administered 2019-05-01: 18:00:00 40 mg via SUBCUTANEOUS
  Filled 2019-04-30: qty 0.4

## 2019-04-30 MED ORDER — ORAL CARE MOUTH RINSE
15.0000 mL | Freq: Two times a day (BID) | OROMUCOSAL | Status: DC
  Administered 2019-05-01 – 2019-05-23 (×43): 15 mL via OROMUCOSAL

## 2019-04-30 MED ORDER — NITROGLYCERIN 1 MG/10 ML FOR IR/CATH LAB
INTRA_ARTERIAL | Status: AC
  Filled 2019-04-30: qty 10

## 2019-04-30 MED ORDER — ACETAMINOPHEN 160 MG/5ML PO SOLN: 650.0000 mg | ORAL | Status: DC | PRN

## 2019-04-30 MED ORDER — CEFAZOLIN SODIUM-DEXTROSE 2-4 GM/100ML-% IV SOLN
INTRAVENOUS | Status: AC
  Filled 2019-04-30: qty 100

## 2019-04-30 MED ORDER — CLEVIDIPINE BUTYRATE 0.5 MG/ML IV EMUL
0.0000 mg/h | INTRAVENOUS | Status: AC
  Administered 2019-04-30: 2 mg/h via INTRAVENOUS
  Administered 2019-04-30 (×2): 10 mg/h via INTRAVENOUS
  Administered 2019-05-01: 12 mg/h via INTRAVENOUS
  Administered 2019-05-01 (×2): 16 mg/h via INTRAVENOUS
  Administered 2019-05-01: 10 mg/h via INTRAVENOUS
  Filled 2019-04-30 (×7): qty 50

## 2019-04-30 MED ORDER — SENNOSIDES-DOCUSATE SODIUM 8.6-50 MG PO TABS: 1.0000 | ORAL_TABLET | Freq: Every evening | ORAL | Status: DC | PRN

## 2019-04-30 MED ORDER — ONDANSETRON HCL 4 MG/2ML IJ SOLN: 4.0000 mg | Freq: Four times a day (QID) | INTRAMUSCULAR | Status: DC | PRN

## 2019-04-30 MED ORDER — CHLORHEXIDINE GLUCONATE CLOTH 2 % EX PADS
6.0000 | MEDICATED_PAD | Freq: Every day | CUTANEOUS | Status: DC
  Administered 2019-04-30: 18:00:00 6 via TOPICAL

## 2019-04-30 MED ORDER — SODIUM CHLORIDE 0.9% FLUSH: 3.0000 mL | Freq: Once | INTRAVENOUS | Status: DC

## 2019-04-30 MED ORDER — NITROGLYCERIN 1 MG/10 ML FOR IR/CATH LAB
INTRA_ARTERIAL | Status: AC | PRN
  Administered 2019-04-30 (×2): 25 ug via INTRA_ARTERIAL

## 2019-04-30 MED ORDER — ROCURONIUM BROMIDE 50 MG/5ML IV SOSY
PREFILLED_SYRINGE | INTRAVENOUS | Status: DC | PRN
  Administered 2019-04-30: 50 mg via INTRAVENOUS

## 2019-04-30 MED ORDER — ONDANSETRON HCL 4 MG/2ML IJ SOLN
INTRAMUSCULAR | Status: DC | PRN
  Administered 2019-04-30: 4 mg via INTRAVENOUS

## 2019-04-30 MED ORDER — FENTANYL CITRATE (PF) 100 MCG/2ML IJ SOLN
INTRAMUSCULAR | Status: AC
  Filled 2019-04-30: qty 2

## 2019-04-30 MED ORDER — SUGAMMADEX SODIUM 200 MG/2ML IV SOLN
INTRAVENOUS | Status: DC | PRN
  Administered 2019-04-30: 400 mg via INTRAVENOUS

## 2019-04-30 MED ORDER — STROKE: EARLY STAGES OF RECOVERY BOOK
Freq: Once | Status: DC
  Filled 2019-04-30: qty 1

## 2019-04-30 MED ORDER — LABETALOL HCL 5 MG/ML IV SOLN
INTRAVENOUS | Status: DC | PRN
  Administered 2019-04-30 (×3): 5 mg via INTRAVENOUS

## 2019-04-30 MED ORDER — SODIUM CHLORIDE 0.9 % IV SOLN
INTRAVENOUS | Status: DC
  Administered 2019-04-30: 16:00:00 via INTRAVENOUS

## 2019-04-30 MED ORDER — CHLORHEXIDINE GLUCONATE 0.12 % MT SOLN
15.0000 mL | Freq: Two times a day (BID) | OROMUCOSAL | Status: DC
  Administered 2019-04-30 – 2019-05-24 (×46): 15 mL via OROMUCOSAL
  Filled 2019-04-30 (×45): qty 15

## 2019-04-30 MED ORDER — INSULIN ASPART 100 UNIT/ML ~~LOC~~ SOLN
0.0000 [IU] | SUBCUTANEOUS | Status: DC
  Administered 2019-05-11 – 2019-05-19 (×4): 1 [IU] via SUBCUTANEOUS

## 2019-04-30 NOTE — ED Provider Notes (Signed)
[facility] EMERGENCY DEPARTMENT Provider Note   CSN: [id number] Arrival date & time: 04/30/19  1147     History   Chief Complaint No chief complaint on file.   HPI Kyle [name] is a 62 y.o. male.     Level 5 caveat for aphasic and acuity of condition.  Patient arrives via EMS with left-sided paralysis, aphasia.  Last seen normal was approximately 6 AM by his roommate.  No report of trauma.  Patient unable to give any history. No report of chest pain, shortness of breath, cough, fever preceding illness.  Chart review shows history of atrial fibrillation, COPD, GERD, hypertension and sleep apnea. Xarelto is on medication list but has reportedly not been taking.  The history is provided by the EMS personnel. The history is limited by the condition of the patient.    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 AND DOES NOT HAVE MASK OR MACHINE AT HOME   Stroke [facility])    Wears dentures     Patient Active Problem List   Diagnosis Date Noted   Chronic atrial fibrillation ([place]) 08/26/2015   Hyperlipidemia LDL goal <100 06/26/2014   Routine general medical examination at a health care facility 11/21/2013   Screening PSA (prostate specific antigen) 11/21/2013   Special screening for malignant neoplasms, colon 11/21/2013   OSA (obstructive sleep apnea) 08/22/2013   Morbid obesity ([place]) 04/19/2013   Other  emphysema ([place]) 01/31/2013   Essential hypertension 12/21/2012   Insomnia 12/21/2012   GERD (gastroesophageal reflux disease) 09/20/2012   Back pain, chronic 09/20/2012   Obesity hypoventilation syndrome ([place]) 09/20/2012   Adjustment disorder with mixed anxiety and depressed mood 09/20/2012    Past Surgical History:  Procedure Laterality Date   ESOPHAGOGASTRODUODENOSCOPY  05/18/2011   Procedure: ESOPHAGOGASTRODUODENOSCOPY (EGD);  Surgeon: Shann Medal, MD;  Location: [facility];  Service: Endoscopy;  Laterality: N/A;   FOOT SURGERY  2003   right foot surgery from work accident    [place]  10/19/11   [place]   calcium deposit removed on right  leg         Home Medications    Prior to Admission medications   Medication Sig Start Date End Date Taking? Authorizing Provider  albuterol (PROVENTIL HFA;VENTOLIN HFA) 108 (90 Base) MCG/ACT inhaler Inhale 2 puffs into the lungs every 4 (four) hours as needed for wheezing or shortness of breath. 08/30/15   Gildardo Cranker, DO  amitriptyline (ELAVIL) 50 MG tablet TAKE 1 TABLET BY MOUTH EVERYDAY AT BEDTIME Patient taking differently: Take 50 mg by mouth at bedtime.  10/14/18   Pleas Koch, NP  aspirin EC 81 MG tablet Take 81 mg by mouth daily.    [provider]  atorvastatin (LIPITOR) 20 MG tablet Take 1 tablet (20 mg total) by mouth daily.  For cholesterol. 08/24/18   Pleas Koch, NP  atorvastatin (LIPITOR) 40 MG tablet Take 40 mg by mouth daily. 04/20/19   [provider]  gabapentin (NEURONTIN) 600 MG tablet TAKE 1 TABLET BY MOUTH THREE TIMES A DAY Patient taking differently: Take 600 mg by mouth 3 (three) times daily.  10/11/18   Pleas Koch, NP  lisinopril (PRINIVIL,ZESTRIL) 20 MG tablet Take 1 tablet (20 mg total) by mouth daily. For blood pressure. 08/16/18   Pleas Koch, NP  lisinopril (ZESTRIL) 10 MG tablet Take 10 mg by mouth daily.  04/20/19   [provider]  metoprolol succinate (TOPROL-XL) 100 MG 24 hr tablet Take 100 mg by mouth daily. 01/19/19   [provider]  nystatin cream (MYCOSTATIN)  04/20/19   [provider]  oxyCODONE (OXY IR/ROXICODONE) 5 MG immediate release tablet Take 5 mg by mouth 3 (three) times daily. 01/19/19   [provider]  pantoprazole (PROTONIX) 40 MG tablet Take 40 mg by mouth daily. 01/19/19   [provider]  PARoxetine (PAXIL) 10 MG tablet Take 10 mg by mouth daily. 01/19/19   [provider]  metoprolol tartrate (LOPRESSOR) 25 MG tablet Take 1 tablet (25 mg total) by mouth 2 (two) times daily. For blood pressure and heart rate. Patient not taking: Reported on 01/21/2019 09/15/18 01/21/19  Pleas Koch, NP  rivaroxaban (XARELTO) 20 MG TABS tablet Take 1 tablet (20 mg total) by mouth daily with supper. For blood clot prevention. Patient not taking: Reported on 10/03/2018 08/24/18 01/21/19  Pleas Koch, NP    Family History Family History  Problem Relation Age of Onset   Cancer Mother        melanoma   Heart disease Mother    Rheum arthritis Mother    Lung cancer Father        melanoma   Heart disease Father    Heart disease Sister     Social History Social History   Tobacco Use   Smoking status: Current Every Day Smoker    Packs/day: 0.50    Years: 40.00    Pack years: 20.00    Types: Cigarettes   Smokeless tobacco: Never Used  Substance Use Topics   Alcohol use: Yes    Alcohol/week: 0.0 standard drinks    Comment: RARELY   Drug use: No     Allergies   Vioxx [rofecoxib]   Review of Systems Review of Systems  Unable to perform ROS: Acuity of condition     Physical Exam Updated Vital Signs BP (!) 157/96    Pulse 89    Temp 98.3 F (36.8 C) (Axillary)    Resp 18    SpO2 95%   Physical Exam Constitutional:      General: He is in acute distress.     Appearance: He is obese. He is ill-appearing.      Comments: Aphasic, fixed gaze to the right  HENT:     Mouth/Throat:     Mouth: Mucous membranes are moist.  Cardiovascular:     Rate and Rhythm: Normal rate. Rhythm irregular.     Pulses: Normal pulses.     Heart sounds: No murmur.  Pulmonary:     Effort: Pulmonary effort is normal.  Abdominal:     Tenderness: There is no abdominal tenderness.  Musculoskeletal: Normal range of motion.  Skin:    General: Skin is warm.  Neurological:     Mental Status: He is alert.  Comments: Aphasic, protrudes tongue from the midline, coughing.  Gag reflex is present. Right-sided facial droop Able to follow commands with the right arm. Able to lift right leg off the bed  unable to move left arm or left leg.  Minimal movement of right leg.       ED Treatments / Results  Labs (all labs ordered are listed, but only abnormal results are displayed) Labs Reviewed  COMPREHENSIVE METABOLIC PANEL - Abnormal; Notable for the following components:      Result Value   Glucose, Bld 113 (*)    All other components within normal limits  RAPID URINE DRUG SCREEN, HOSP PERFORMED - Abnormal; Notable for the following components:   Amphetamines POSITIVE (*)    All other components within normal limits  URINALYSIS, ROUTINE W REFLEX MICROSCOPIC - Abnormal; Notable for the following components:   APPearance HAZY (*)    Specific Gravity, Urine >1.046 (*)    All other components within normal limits  I-STAT CHEM 8, ED - Abnormal; Notable for the following components:   Glucose, Bld 109 (*)    All other components within normal limits  SARS CORONAVIRUS 2 BY RT PCR (HOSPITAL ORDER, [facility])  MRSA PCR SCREENING  PROTIME-INR  APTT  CBC  DIFFERENTIAL  ETHANOL  HIV ANTIBODY (ROUTINE TESTING W REFLEX)  HEMOGLOBIN A1C  LIPID PANEL  CBC WITH DIFFERENTIAL/PLATELET  BASIC METABOLIC PANEL  CBG MONITORING, ED  I-STAT CHEM 8, ED    EKG EKG Interpretation  Date/Time:  Sunday  April 30 2019 12:12:14 EST Ventricular Rate:  69 PR Interval:    QRS Duration: 116 QT Interval:  426 QTC Calculation: 456 R Axis:   132 Text Interpretation: Atrial fibrillation Left posterior fascicular block Abnormal ECG Confirmed by Ezequiel Essex [phone]) on 04/30/2019 12:20:42 PM   Radiology Ct Head Code Stroke Wo Contrast  Result Date: 04/30/2019 CLINICAL DATA:  Code stroke.  Left-sided weakness. EXAM: CT HEAD WITHOUT CONTRAST TECHNIQUE: Contiguous axial images were obtained from the base of the skull through the vertex without intravenous contrast. COMPARISON:  None. FINDINGS: Brain: Chronic infarct right lateral temporal lobe. Chronic infarct left frontal lobe. Chronic infarct anterior limb internal capsule on the left. Negative for acute infarct. Negative for hemorrhage or mass. Mild ventricular enlargement most likely due to mild atrophy. Vascular: Hyperdense right MCA at the bifurcation. Probable acute thrombus. Skull: Negative Sinuses/Orbits: Cyst left maxillary sinus otherwise sinuses clear. Negative orbit. Other: None ASPECTS ([place] Stroke Program Early CT Score) - Ganglionic level infarction (caudate, lentiform nuclei, internal capsule, insula, M1-M3 cortex): 7 - Supraganglionic infarction (M4-M6 cortex): 3 Total score (0-10 with 10 being normal): 10 IMPRESSION: 1. Probable hyperdense right MCA bifurcation.  No acute infarct. 2. Atrophy with chronic right temporal infarct and chronic left frontal infarct. 3. ASPECTS is 10 4. Results texted to Dr. Lorraine Lax Electronically Signed   By: Franchot Gallo M.D.   On: 04/30/2019 11:54    Procedures .Critical Care Performed by: Ezequiel Essex, MD Authorized by: Ezequiel Essex, MD   Critical care provider statement:    Critical care time (minutes):  35   Critical care was necessary to treat or prevent imminent or life-threatening deterioration of the following conditions:  CNS failure or compromise   Critical care was time spent  personally by me on the following activities:  Discussions with consultants, evaluation of patient's response to treatment, examination of patient, ordering and performing treatments and interventions, ordering and review of laboratory studies, ordering  and review of radiographic studies, pulse oximetry, re-evaluation of patient's condition, obtaining history from patient or surrogate and review of old charts   (including critical care time)  Medications Ordered in ED Medications  sodium chloride flush (NS) 0.9 % injection 3 mL (has no administration in time range)  iohexol (OMNIPAQUE) 350 MG/ML injection 100 mL (100 mLs Intravenous Contrast Given 04/30/19 1157)     Initial Impression / Assessment and Plan / ED Course  I have reviewed the triage vital signs and the nursing notes.  Pertinent labs & imaging results that were available during my care of the patient were reviewed by me and considered in my medical decision making (see chart for details).       Patient with flaccid left-sided weakness and aphasia.  Last seen normal 6 AM.  Code stroke activated by EMS.  Seen at bedside with neurology Dr. Lorraine Lax.   Patient not a candidate for TPA due to delayed presentation but does have a large vessel occlusion and will proceed to IR for emergent thrombectomy.  Patient does have a gag reflex, appears to be protecting airway, and neurology states anesthesia will intubate upstairs.  CT shows no hemorrhage but does show hyperdense MCA sign.  Patient to be taken emergently to IR with neurology and interventional radiology for clot retrieval. Care transferred to Dr. Lorraine Lax.    Final Clinical Impressions(s) / ED Diagnoses   Final diagnoses:  Stroke [facility])    ED Discharge Orders    None       Ezequiel Essex, MD 04/30/19 1814

## 2019-04-30 NOTE — ED Triage Notes (Signed)
Pt from home via ems; called out by friend who found pt down; ensure of LSN; pt has R sided gaze, L sided paralysis, and is mute; hx afib; BP 150/90, HR 70, CBG 105

## 2019-04-30 NOTE — Progress Notes (Signed)
Patient ID: OLSON FACKRELL, male   DOB: [birth year], 63 y.o.   MRN: [id number] INR 63 Y RT H M LSW?6 am./ MRSS 0 to 1 New onsert Lt sided ewakness,RT gaze and aphasia. CT Brain NO ICH ASPECTS 8. CTA occluded RT MCA M1 seg . CTP no core with Tmax> 60 s vol 219 ml. Option of endovascular revascularization considered appropriate. Emergency consent in the form of 2 physicians obtained as no family or NOK  available . S.Joslyn Ramos MD

## 2019-04-30 NOTE — Progress Notes (Signed)
Kyle [name] ( friend) telephone number [phone]

## 2019-04-30 NOTE — Progress Notes (Addendum)
Neurology paged and updated.  Currently arterial line and cuff blood pressure do not correlate.  Arterial waveform appears to have a wipe and reads about 40 points higher than cuff pressure.  Neurology ok to go by cuff pressure for now.  Patient's saturation is 100% currently with 2 liter nasal cannula but respiratory status is questionable  Neurology will come assess patient.  RN to continue to monitor.   1800:  Neurology came to assess patient and is fine with patient's respiratory status.  Verbal order that patient head of bed can be elevated.

## 2019-04-30 NOTE — Sedation Documentation (Signed)
Groin and pulses assess at bedside upon arrival to 4N15. No change, see flowsheet.

## 2019-04-30 NOTE — H&P (Signed)
NEURO HOSPITALIST H&P      Chief Complaint:  Right side gaze and left arm paralysis  History obtained from:  Ems and Chart HPI:                                                                                                                                         Kyle [name] is an 63 y.o. male  With PMH a. Fib ( not anticoagulated) sick sinus syndrom, HTN, CVA, OSA ( not on CPAP) who presented to [facility] as a code stroke.  LSN was reported by EMS as 0600 but upon investigation Per friend Kyle [name] ( via telephone) patient was not seen at 0600. At 0600 Kyle [name] wife came home and it was dark. She said that she did not see him sitting in the car, but it was dark. At about 1000 am Kyle [name] was found in his car confused and EMS was called. Not sure when he actually got into the car. Patient smokes 1/2 pack of cigarettes per day, drinks ETOH. Patient is prescribed xarelto, but no xarelto was found with his medications per med list given by Kyle [name].   ED course:  BP: 150/90 BG:105 CTH: no hemorrhage. CTA: right MCA occlusion CTP: core 0. [place]: 219  Per family medicine note 09-15-2018 by Kyle [name]. Patient was recommended to re-start anticoagulation for his a. Fib. xarelto was started.  Per note from Kyle [name] on 04/23/2019 patient was not taking xarelto.    Date last known well: 04/29/2019 Time last known well:unknown tPA Given:no outside of window Modified Rankin: Rankin Score=1     Past Medical History:  Diagnosis Date  . Arthritis   . Atrial fibrillation    HX OF SICK SINUS SYNDROME-ATRIAL FIB  . Back pain, chronic    PT STATES 5 BULGING DISKS AND PINCHED NERVES--PT ON OXYCODONE 4 TIMES A DAY FOR HIS PAIN  . Chest pain 08/26/2015  . Chronic airway obstruction, not elsewhere classified 12/21/2012  . GERD (gastroesophageal reflux disease)   . Hypertension   . Impaired fasting glucose 04/19/2013  . Peripheral  vascular disease (HCC)    CHRONIC VENOUS INSUFFICIENCY  . Shortness of breath   . 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 Stroke ([place])   . Wears dentures     Past Surgical History:  Procedure Laterality Date  . ESOPHAGOGASTRODUODENOSCOPY  05/18/2011   Procedure: ESOPHAGOGASTRODUODENOSCOPY (EGD);  Surgeon: Kyle Medal, [name];  Location: [name];  Service: Endoscopy;  Laterality: [name]/A;  . FOOT SURGERY  2003  right foot surgery from work accident   . GASTRIC BYPASS  10/19/11  . GASTRIC RESTRICTION SURGERY  1992  . LEG SURGERY  1998   calcium deposit removed on right  leg     Family History  Problem Relation Age of Onset  . Cancer Mother        melanoma  . Heart disease Mother   . Rheum arthritis Mother   . Lung cancer Father        melanoma  . Heart disease Father   . Heart disease Sister          Social History:  reports that he has been smoking cigarettes. He has a 20.00 pack-year smoking history. He has never used smokeless tobacco. He reports current alcohol use. He reports that he does not use drugs.  Allergies:  Allergies  Allergen Reactions  . Vioxx [Rofecoxib] Rash    Medications:                                                                                                                           Current Facility-Administered Medications  Medication Dose Route Frequency Provider Last Rate Last Dose  .  stroke: mapping our early stages of recovery book   Does not apply Once Kyle [name], Kyle [name], [name]      . 0.9 %  sodium chloride infusion   Intravenous Continuous Kyle [name]      . acetaminophen (TYLENOL) tablet 650 mg  650 mg Oral Q4H PRN Kyle [name]       Or  . acetaminophen (TYLENOL) 160 MG/5ML solution 650 mg  650 mg Per Tube Q4H PRN Kyle [name]       Or  . acetaminophen (TYLENOL) suppository 650 mg  650 mg Rectal Q4H PRN Kyle [name]       . ceFAZolin (ANCEF) 2-4 GM/100ML-% IVPB           . [START ON 05/01/2019] enoxaparin (LOVENOX) injection 40 mg  40 mg Subcutaneous Q24H Kyle [name]      . fentaNYL (SUBLIMAZE) 100 MCG/2ML injection           . nitroGLYCERIN 100 mcg/mL intra-arterial injection           . senna-docusate (Senokot-S) tablet 1 tablet  1 tablet Oral QHS PRN Kyle [name]      . sodium chloride flush (NS) 0.9 % injection 3 mL  3 mL Intravenous Once Kyle [name]       Current Outpatient Medications  Medication Sig Dispense Refill  . albuterol (PROVENTIL HFA;VENTOLIN HFA) 108 (90 Base) MCG/ACT inhaler Inhale 2 puffs into the lungs every 4 (four) hours as needed for wheezing or shortness of breath. 1 Inhaler 5  . amitriptyline (ELAVIL) 50 MG tablet TAKE 1 TABLET BY MOUTH EVERYDAY AT BEDTIME (Patient taking differently: Take 50 mg by mouth at bedtime. ) 30 tablet 0  . aspirin EC 81  MG tablet Take 81 mg by mouth daily.    Marland Kitchen atorvastatin (LIPITOR) 20 MG tablet Take 1 tablet (20 mg total) by mouth daily. For cholesterol. 90 tablet 3  . atorvastatin (LIPITOR) 40 MG tablet Take 40 mg by mouth daily.    Marland Kitchen gabapentin (NEURONTIN) 600 MG tablet TAKE 1 TABLET BY MOUTH THREE TIMES A DAY (Patient taking differently: Take 600 mg by mouth 3 (three) times daily. ) 90 tablet 0  . lisinopril (PRINIVIL,ZESTRIL) 20 MG tablet Take 1 tablet (20 mg total) by mouth daily. For blood pressure. 90 tablet 0  . lisinopril (ZESTRIL) 10 MG tablet Take 10 mg by mouth daily.    . metoprolol succinate (TOPROL-XL) 100 MG 24 hr tablet Take 100 mg by mouth daily.    Marland Kitchen nystatin cream (MYCOSTATIN)     . oxyCODONE (OXY IR/ROXICODONE) 5 MG immediate release tablet Take 5 mg by mouth 3 (three) times daily.    . pantoprazole (PROTONIX) 40 MG tablet Take 40 mg by mouth daily.    Marland Kitchen PARoxetine (PAXIL) 10 MG tablet Take 10 mg by mouth daily.       ROS:                                                                                                                                         unobtainable from patient due to mental status     General Examination:                                                                                                      There were no vitals taken for this visit.  Physical Exam  Constitutional: Appears well-developed and well-nourished.  Morbidly obese Psych: Affect appropriate to situation Eyes: Normal external eye and conjunctiva. HENT: Normocephalic, no lesions, without obvious abnormality.   Musculoskeletal-no joint tenderness, deformity or swelling Cardiovascular: Normal rate and regular rhythm.  Respiratory: Effort normal, non-labored breathing saturations WNL GI: Soft.  No distension. There is no tenderness.  Skin: WDI  Neurological Examination Mental Status: Awake, follows simple commands intermittently.  Severely dysarthric, attempts to speak but unintelligible. Cranial Nerves: II: No blink to threat on the left side III,IV, VI: ptosis not present, forced gaze deviation to the right, pupils equal, round, reactive to light and accommodation VII: Left facial droop VIII: Unable to assess due to mental status IX,X: uvula rises midline XI: Unable to assess due to mental status XII: midline tongue extension Motor: Right : Upper extremity  4+/5  Left:     Upper extremity   0/5  Lower extremity   4+/5   Lower extremity  05/5 Tone and bulk:normal tone throughout; no atrophy noted Sensory: Sensory neglect on the left side to light touch, noxious stimulus Deep Tendon Reflexes: 2+ and symmetric biceps and patella Plantars: Right: downgoing   Left: downgoing Cerebellar: No gross ataxia noted on the right side    Lab Results: Basic Metabolic Panel: Recent Labs  Lab 04/23/19 1734  NA 138  K 3.9  CL 101  CO2 23  GLUCOSE 99  BUN 25*  CREATININE 0.88  CALCIUM 9.8    CBC: Recent Labs  Lab 04/23/19 1734  WBC 10.5  HGB 15.4  HCT 47.8  MCV 85.8  PLT 273     Imaging: Ct Code Stroke Cta Head W/wo Contrast  Result Date: 04/30/2019 CLINICAL DATA:  Stroke. Left-sided weakness. Atrial fibrillation off anticoagulation. EXAM: CT ANGIOGRAPHY HEAD AND NECK CT PERFUSION BRAIN TECHNIQUE: Multidetector CT imaging of the head and neck was performed using the standard protocol during bolus administration of intravenous contrast. Multiplanar CT image reconstructions and MIPs were obtained to evaluate the vascular anatomy. Carotid stenosis measurements (when applicable) are obtained utilizing NASCET criteria, using the distal internal carotid diameter as the denominator. Multiphase CT imaging of the brain was performed following IV bolus contrast injection. Subsequent parametric perfusion maps were calculated using RAPID software. CONTRAST:  162mL OMNIPAQUE IOHEXOL 350 MG/ML SOLN COMPARISON:  CT head 04/30/2019 FINDINGS: CTA NECK FINDINGS Aortic arch: Suboptimal arterial opacification. Contrast is in the venous phase with limited arterial contrast. Atherosclerotic disease in the aorta. Proximal great vessels patent. Image interpretation delayed as patient was registered under the wrong name. Right carotid system: Mild atherosclerotic disease right carotid bifurcation without significant stenosis Left carotid system: Mild atherosclerotic disease left bifurcation without significant stenosis. Vertebral arteries: Both vertebral arteries are patent. Limited contrast opacification. Skeleton: No acute abnormality. Other neck: Negative for mass or adenopathy. Upper chest: Negative Review of the MIP images confirms the above findings CTA HEAD FINDINGS Anterior circulation: Mild atherosclerotic disease in the cavernous carotid bilaterally. Right MCA occlusion at the bifurcation. Chronic infarct right temporal lobe. Both anterior cerebral arteries patent. Left middle cerebral artery widely patent. Posterior circulation: Vertebral arteries not well visualized. Basilar artery not well  visualized due to lack of adequate contrast. Fetal origin of the posterior cerebral arteries bilaterally Venous sinuses: No venous contrast present. Anatomic variants: None Review of the MIP images confirms the above findings CT Brain Perfusion Findings: ASPECTS: 10 CBF (<30%) Volume: 60mL Perfusion (Tmax>6.0s) volume: 269mL Mismatch Volume: 02/01/2019mL Infarction Location:Right MCA territory involving the right lateral temporal lobe and parietal lobe. IMPRESSION: Suboptimal CTA. Delayed interpretation due to registration under the wrong name. Very early arterial phase study with limited arterial opacification in the CTA. As best I can tell both carotid arteries are patent in the neck. There is occlusion of the right MCA at the bifurcation. CT perfusion markedly abnormal with large area of delayed perfusion right MCA territory. 219 mL of delayed perfusion. No core infarct. These results were called by telephone at the time of interpretation on 04/30/2019 at 12:30 pm to provider Aroor, who verbally acknowledged these results. Electronically Signed   By: Franchot Gallo M.D.   On: 04/30/2019 12:33   Ct Code Stroke Cta Neck W/wo Contrast  Result Date: 04/30/2019 CLINICAL DATA:  Stroke. Left-sided weakness. Atrial fibrillation off anticoagulation. EXAM: CT ANGIOGRAPHY HEAD AND NECK CT PERFUSION BRAIN TECHNIQUE: Multidetector  CT imaging of the head and neck was performed using the standard protocol during bolus administration of intravenous contrast. Multiplanar CT image reconstructions and MIPs were obtained to evaluate the vascular anatomy. Carotid stenosis measurements (when applicable) are obtained utilizing NASCET criteria, using the distal internal carotid diameter as the denominator. Multiphase CT imaging of the brain was performed following IV bolus contrast injection. Subsequent parametric perfusion maps were calculated using RAPID software. CONTRAST:  13mL OMNIPAQUE IOHEXOL 350 MG/ML SOLN COMPARISON:  CT  head 04/30/2019 FINDINGS: CTA NECK FINDINGS Aortic arch: Suboptimal arterial opacification. Contrast is in the venous phase with limited arterial contrast. Atherosclerotic disease in the aorta. Proximal great vessels patent. Image interpretation delayed as patient was registered under the wrong name. Right carotid system: Mild atherosclerotic disease right carotid bifurcation without significant stenosis Left carotid system: Mild atherosclerotic disease left bifurcation without significant stenosis. Vertebral arteries: Both vertebral arteries are patent. Limited contrast opacification. Skeleton: No acute abnormality. Other neck: Negative for mass or adenopathy. Upper chest: Negative Review of the MIP images confirms the above findings CTA HEAD FINDINGS Anterior circulation: Mild atherosclerotic disease in the cavernous carotid bilaterally. Right MCA occlusion at the bifurcation. Chronic infarct right temporal lobe. Both anterior cerebral arteries patent. Left middle cerebral artery widely patent. Posterior circulation: Vertebral arteries not well visualized. Basilar artery not well visualized due to lack of adequate contrast. Fetal origin of the posterior cerebral arteries bilaterally Venous sinuses: No venous contrast present. Anatomic variants: None Review of the MIP images confirms the above findings CT Brain Perfusion Findings: ASPECTS: 10 CBF (<30%) Volume: 38mL Perfusion (Tmax>6.0s) volume: 25mL Mismatch Volume: 02/01/2019mL Infarction Location:Right MCA territory involving the right lateral temporal lobe and parietal lobe. IMPRESSION: Suboptimal CTA. Delayed interpretation due to registration under the wrong name. Very early arterial phase study with limited arterial opacification in the CTA. As best I can tell both carotid arteries are patent in the neck. There is occlusion of the right MCA at the bifurcation. CT perfusion markedly abnormal with large area of delayed perfusion right MCA territory. 219 mL of  delayed perfusion. No core infarct. These results were called by telephone at the time of interpretation on 04/30/2019 at 12:30 pm to provider Aroor, who verbally acknowledged these results. Electronically Signed   By: Franchot Gallo M.D.   On: 04/30/2019 12:33   Ct Code Stroke Cerebral Perfusion With Contrast  Result Date: 04/30/2019 CLINICAL DATA:  Stroke. Left-sided weakness. Atrial fibrillation off anticoagulation. EXAM: CT ANGIOGRAPHY HEAD AND NECK CT PERFUSION BRAIN TECHNIQUE: Multidetector CT imaging of the head and neck was performed using the standard protocol during bolus administration of intravenous contrast. Multiplanar CT image reconstructions and MIPs were obtained to evaluate the vascular anatomy. Carotid stenosis measurements (when applicable) are obtained utilizing NASCET criteria, using the distal internal carotid diameter as the denominator. Multiphase CT imaging of the brain was performed following IV bolus contrast injection. Subsequent parametric perfusion maps were calculated using RAPID software. CONTRAST:  176mL OMNIPAQUE IOHEXOL 350 MG/ML SOLN COMPARISON:  CT head 04/30/2019 FINDINGS: CTA NECK FINDINGS Aortic arch: Suboptimal arterial opacification. Contrast is in the venous phase with limited arterial contrast. Atherosclerotic disease in the aorta. Proximal great vessels patent. Image interpretation delayed as patient was registered under the wrong name. Right carotid system: Mild atherosclerotic disease right carotid bifurcation without significant stenosis Left carotid system: Mild atherosclerotic disease left bifurcation without significant stenosis. Vertebral arteries: Both vertebral arteries are patent. Limited contrast opacification. Skeleton: No acute abnormality. Other neck: Negative for mass or  adenopathy. Upper chest: Negative Review of the MIP images confirms the above findings CTA HEAD FINDINGS Anterior circulation: Mild atherosclerotic disease in the cavernous carotid  bilaterally. Right MCA occlusion at the bifurcation. Chronic infarct right temporal lobe. Both anterior cerebral arteries patent. Left middle cerebral artery widely patent. Posterior circulation: Vertebral arteries not well visualized. Basilar artery not well visualized due to lack of adequate contrast. Fetal origin of the posterior cerebral arteries bilaterally Venous sinuses: No venous contrast present. Anatomic variants: None Review of the MIP images confirms the above findings CT Brain Perfusion Findings: ASPECTS: 10 CBF (<30%) Volume: 34mL Perfusion (Tmax>6.0s) volume: 221mL Mismatch Volume: 02/01/2019mL Infarction Location:Right MCA territory involving the right lateral temporal lobe and parietal lobe. IMPRESSION: Suboptimal CTA. Delayed interpretation due to registration under the wrong name. Very early arterial phase study with limited arterial opacification in the CTA. As best I can tell both carotid arteries are patent in the neck. There is occlusion of the right MCA at the bifurcation. CT perfusion markedly abnormal with large area of delayed perfusion right MCA territory. 219 mL of delayed perfusion. No core infarct. These results were called by telephone at the time of interpretation on 04/30/2019 at 12:30 pm to provider Aroor, who verbally acknowledged these results. Electronically Signed   By: Franchot Gallo M.D.   On: 04/30/2019 12:33   Ct Head Code Stroke Wo Contrast  Result Date: 04/30/2019 CLINICAL DATA:  Code stroke.  Left-sided weakness. EXAM: CT HEAD WITHOUT CONTRAST TECHNIQUE: Contiguous axial images were obtained from the base of the skull through the vertex without intravenous contrast. COMPARISON:  None. FINDINGS: Brain: Chronic infarct right lateral temporal lobe. Chronic infarct left frontal lobe. Chronic infarct anterior limb internal capsule on the left. Negative for acute infarct. Negative for hemorrhage or mass. Mild ventricular enlargement most likely due to mild atrophy.  Vascular: Hyperdense right MCA at the bifurcation. Probable acute thrombus. Skull: Negative Sinuses/Orbits: Cyst left maxillary sinus otherwise sinuses clear. Negative orbit. Other: None ASPECTS ([place] Stroke Program Early CT Score) - Ganglionic level infarction (caudate, lentiform nuclei, internal capsule, insula, M1-M3 cortex): 7 - Supraganglionic infarction (M4-M6 cortex): 3 Total score (0-10 with 10 being normal): 10 IMPRESSION: 1. Probable hyperdense right MCA bifurcation.  No acute infarct. 2. Atrophy with chronic right temporal infarct and chronic left frontal infarct. 3. ASPECTS is 10 4. Results texted to Dr. Lorraine Lax Electronically Signed   By: Franchot Gallo M.D.   On: 04/30/2019 11:54       Kyle Morale, MSN, [name][facility] [phone]  04/30/2019, 11:58 AM   Attending physician note to follow with Assessment and plan .   Assessment: 63 y.o. male with prior strokes and past medical history of A. fib not on anticoagulation, hypertension,, morbid obesity and obstructive sleep apnea presents with a acute right MCA stroke secondary to right M1 occlusion with a large perfusion mismatch. NIH stroke scale was >20.  Patient was taken to IR with emergency consent (only friend available as contact  as of now).  Patient underwent TEE to see recanalization and was extubated following the procedure.   Right MCA acute ischemic stroke secondary right M1 occlusion status post emergent mechanical thrombectomy with TICI 2 C recanalization  Admit to neuro ICU Blood pressure goal 1 [id number] systolic per neuro IR, patient on Cleviprex GTT MRI brain when stable Echocardiogram HbA1c and lipid profile Frequent neurochecks [name].p.o.   Hypertension -BP goals as stated above  Morbid obesity and obstructive sleep apnea -Head of bed elevated  to minimize hypoxia -Close monitoring of airway  Atrial fibrillation -High CHA2DS2-VASc 2 score, supposed to be on Xarelto but not on any  anticoagulation presently -Will need to be started on anticoagulation depending on size of stroke  Full code DVT PPx SCDs    This patient is neurologically critically ill due to right MCA stroke secondary to M1 occlusion.  He is at risk for significant risk of neurological worsening from cerebral edema,  death from brain herniation, heart failure, hemorrhagic conversion, infection, respiratory failure and seizure. This patient's care requires constant monitoring of vital signs, hemodynamics, respiratory and cardiac monitoring, review of multiple databases, neurological assessment, discussion with family, other specialists and medical decision making of high complexity.  I spent 45  minutes of neurocritical time in the care of this patient.    --please page stroke [name]  Or  PA  Or [name] from 8am -4 pm  as this patient from this time will be  followed by the stroke.   You can look them up on [email or web address]  Password [email or web address]

## 2019-04-30 NOTE — Anesthesia Postprocedure Evaluation (Signed)
Anesthesia Post Note  Patient: Kyle [name]  Procedure(s) Performed: CODE STROKE (N/A )     Patient location during evaluation: PACU Anesthesia Type: General Level of consciousness: awake Pain management: pain level controlled Vital Signs Assessment: post-procedure vital signs reviewed and stable Respiratory status: spontaneous breathing, nonlabored ventilation, respiratory function stable and patient connected to nasal cannula oxygen Cardiovascular status: blood pressure returned to baseline and stable Postop Assessment: no apparent nausea or vomiting Anesthetic complications: no    Last Vitals:  Vitals:   04/30/19 1705 04/30/19 1710  BP: 121/82 127/69  Pulse: 81 72  Resp: 15 16  Temp:    SpO2: 95% 95%    Last Pain:  Vitals:   04/30/19 1600  TempSrc: Axillary                 Ryan P Ellender

## 2019-04-30 NOTE — Transfer of Care (Signed)
Immediate Anesthesia Transfer of Care Note  Patient: Kyle [name]  Procedure(s) Performed: CODE STROKE (N/A )  Patient Location: PACU  Anesthesia Type:General  Level of Consciousness: awake and alert   Airway & Oxygen Therapy: Patient Spontanous Breathing and Patient connected to face mask oxygen  Post-op Assessment: Report given to RN and Post -op Vital signs reviewed and stable  Post vital signs: Reviewed and stable  Last Vitals:  Vitals Value Taken Time  BP    Temp    Pulse    Resp    SpO2      Last Pain: There were no vitals filed for this visit.       Complications: No apparent anesthesia complications

## 2019-04-30 NOTE — Sedation Documentation (Signed)
PACU called and made aware of need for recovery. Will recover in 4N15. Will call en route.

## 2019-04-30 NOTE — Code Documentation (Signed)
Patient arrived via [facility] Code stroke called at 1117  Patient arrived at 104.  Stat labs and head CT done.  18 gage IV placed.  CTA/CTP done.  NIHSS 29, he is mute, Forced Right gaze, Left hemi, facial droop.  Dr Aroor at bedside to assess patient.  IR activation 1158.  12 lead EKG done, covid test done,  2nd PIV started, unable to place foley, placed condom cath.  Anesthesia at bedside to intubate patient about 1245.  Transported to [employer] suite.  Plan admit 4N ICU

## 2019-04-30 NOTE — Anesthesia Preprocedure Evaluation (Addendum)
Anesthesia Evaluation    Reviewed: Allergy & Precautions, Patient's Chart, lab work & pertinent test results  Airway Mallampati: III  TM Distance: >3 FB Neck ROM: Full    Dental  (+) Edentulous Upper, Edentulous Lower   Pulmonary sleep apnea , COPD,  COPD inhaler, Current Smoker,    Pulmonary exam normal breath sounds clear to auscultation       Cardiovascular hypertension, Pt. on medications and Pt. on home beta blockers + Peripheral Vascular Disease  Normal cardiovascular exam+ dysrhythmias Atrial Fibrillation  Rhythm:Regular Rate:Normal  ECG: rate 69. Atrial fibrillation Left posterior fascicular block   Neuro/Psych PSYCHIATRIC DISORDERS CVA    GI/Hepatic Neg liver ROS, GERD  Medicated,  Endo/Other  negative endocrine ROS  Renal/GU negative Renal ROS     Musculoskeletal  (+) Arthritis , Back pain, chronic   Abdominal (+) + obese,   Peds  Hematology HLD   Anesthesia Other Findings STROKE  Reproductive/Obstetrics                            Anesthesia Physical Anesthesia Plan  ASA: III and emergent  Anesthesia Plan: General   Post-op Pain Management:    Induction: Intravenous and Rapid sequence  PONV Risk Score and Plan: 1 and Ondansetron, Dexamethasone and Treatment may vary due to age or medical condition  Airway Management Planned: Oral ETT  Additional Equipment: Arterial line  Intra-op Plan:   Post-operative Plan: Possible Post-op intubation/ventilation  Informed Consent:     Only emergency history available  Plan Discussed with: CRNA  Anesthesia Plan Comments:        Anesthesia Quick Evaluation

## 2019-04-30 NOTE — Progress Notes (Addendum)
Patient arrive to 4NICU around 1500.  Patient is non verbal, able follow commands on right arm and bilateral lower extremities.   Patient withdraws to pain on left arm but does not move to command.  Patient place on 4 liter nasal cannula.  Patient bp is out of parameter will start cleviprex drip. Per report angioseal placed at 1400.

## 2019-04-30 NOTE — Procedures (Signed)
S/P RT common carotid arteriogram RT CFA approach. Findings. 1.S/P complete revascularization of occluded RT MCA M1 seg with x 1 pass with SolitaireX 23m x 40 mm ret river ,and aspiration achieving a TICI 2c revascularization. S.Alailah Safley MD

## 2019-04-30 NOTE — Progress Notes (Signed)
  Echocardiogram 2D Echocardiogram has been performed.  Berenise Hunton A Marvelyn Bouchillon 04/30/2019, 4:16 PM

## 2019-04-30 NOTE — Anesthesia Procedure Notes (Signed)
Arterial Line Insertion Start/End11/15/2020 1:10 PM, 04/30/2019 1:20 PM Performed by: Murvin Natal, MD, anesthesiologist  Patient location: OR. Preanesthetic checklist: patient identified, IV checked, site marked, risks and benefits discussed, surgical consent, monitors and equipment checked, pre-op evaluation, timeout performed and anesthesia consent Emergency situation Left, radial was placed Catheter size: 20 Fr Hand hygiene performed , maximum sterile barriers used  and Seldinger technique used  Attempts: 1 Procedure performed without using ultrasound guided technique. Following insertion, dressing applied and Biopatch. Post procedure assessment: normal and unchanged

## 2019-04-30 NOTE — Anesthesia Procedure Notes (Signed)
Procedure Name: Intubation Date/Time: 04/30/2019 1:00 PM Performed by: Eligha Bridegroom, CRNA Pre-anesthesia Checklist: Patient identified, Emergency Drugs available, Suction available, Patient being monitored and Timeout performed Patient Re-evaluated:Patient Re-evaluated prior to induction Oxygen Delivery Method: Circle system utilized Preoxygenation: Pre-oxygenation with 100% oxygen Induction Type: IV induction, Rapid sequence and Cricoid Pressure applied Laryngoscope Size: Mac and 4 Grade View: Grade I Tube type: Oral Tube size: 7.5 mm Number of attempts: 1 Airway Equipment and Method: Stylet Placement Confirmation: ETT inserted through vocal cords under direct vision,  positive ETCO2 and breath sounds checked- equal and bilateral Secured at: 23 cm Tube secured with: Tape Dental Injury: Teeth and Oropharynx as per pre-operative assessment

## 2019-04-30 NOTE — Progress Notes (Signed)
Patient ID: ARLOW COACHMAN, male   DOB: August 16, 1955, 63 y.o.   MRN: [place]:[id number] INR . Post procedure CT no ICH or mass effect. RRT groin  site secured with an 81F angioseal Distal pulses all dopplerable ,unchanged. Patient extubated maintaining O2 sats  .Moves RT side to command. S.Laconda Basich MD

## 2019-05-01 ENCOUNTER — Encounter (HOSPITAL_COMMUNITY): Payer: Self-pay | Admitting: Radiology

## 2019-05-01 ENCOUNTER — Inpatient Hospital Stay (HOSPITAL_COMMUNITY): Payer: Medicare PPO

## 2019-05-01 DIAGNOSIS — R1312 Dysphagia, oropharyngeal phase: Secondary | ICD-10-CM

## 2019-05-01 DIAGNOSIS — I4819 Other persistent atrial fibrillation: Secondary | ICD-10-CM

## 2019-05-01 DIAGNOSIS — I63411 Cerebral infarction due to embolism of right middle cerebral artery: Principal | ICD-10-CM

## 2019-05-01 DIAGNOSIS — I639 Cerebral infarction, unspecified: Secondary | ICD-10-CM | POA: Diagnosis not present

## 2019-05-01 DIAGNOSIS — G8190 Hemiplegia, unspecified affecting unspecified side: Secondary | ICD-10-CM | POA: Diagnosis not present

## 2019-05-01 DIAGNOSIS — I6601 Occlusion and stenosis of right middle cerebral artery: Secondary | ICD-10-CM

## 2019-05-01 DIAGNOSIS — I10 Essential (primary) hypertension: Secondary | ICD-10-CM

## 2019-05-01 DIAGNOSIS — E785 Hyperlipidemia, unspecified: Secondary | ICD-10-CM

## 2019-05-01 LAB — BASIC METABOLIC PANEL
Anion gap: 16 — ABNORMAL HIGH (ref 5–15)
BUN: 12 mg/dL (ref 8–23)
CO2: 19 mmol/L — ABNORMAL LOW (ref 22–32)
Calcium: 8.8 mg/dL — ABNORMAL LOW (ref 8.9–10.3)
Chloride: 101 mmol/L (ref 98–111)
Creatinine, Ser: 0.73 mg/dL (ref 0.61–1.24)
GFR calc Af Amer: >60 mL/min (ref >60)
GFR calc non Af Amer: >60 mL/min (ref >60)
Glucose, Bld: 113 mg/dL — ABNORMAL HIGH (ref 70–99)
Potassium: 4.2 mmol/L (ref 3.5–5.1)
Sodium: 136 mmol/L (ref 135–145)

## 2019-05-01 LAB — GLUCOSE, CAPILLARY
Glucose-Capillary: 113 mg/dL — ABNORMAL HIGH (ref 70–99)
Glucose-Capillary: 108 mg/dL — ABNORMAL HIGH (ref 70–99)
Glucose-Capillary: 116 mg/dL — ABNORMAL HIGH (ref 70–99)
Glucose-Capillary: 90 mg/dL (ref 70–99)
Glucose-Capillary: 81 mg/dL (ref 70–99)
Glucose-Capillary: 87 mg/dL (ref 70–99)

## 2019-05-01 LAB — CBC WITH DIFFERENTIAL/PLATELET
Abs Immature Granulocytes: 0.03 10*3/uL (ref 0.00–0.07)
Basophils Absolute: 0 10*3/uL (ref 0.0–0.1)
Basophils Relative: 0 %
Eosinophils Absolute: 0.1 10*3/uL (ref 0.0–0.5)
Eosinophils Relative: 1 %
HCT: 46.5 % (ref 39.0–52.0)
Hemoglobin: 15.4 g/dL (ref 13.0–17.0)
Immature Granulocytes: 0 %
Lymphocytes Relative: 10 %
Lymphs Abs: 1.1 10*3/uL (ref 0.7–4.0)
MCH: 27.9 pg (ref 26.0–34.0)
MCHC: 33.1 g/dL (ref 30.0–36.0)
MCV: 84.2 fL (ref 80.0–100.0)
Monocytes Absolute: 0.9 10*3/uL (ref 0.1–1.0)
Monocytes Relative: 8 %
Neutro Abs: 9.2 10*3/uL — ABNORMAL HIGH (ref 1.7–7.7)
Neutrophils Relative %: 81 %
Platelets: 157 10*3/uL (ref 150–400)
RBC: 5.52 MIL/uL (ref 4.22–5.81)
RDW: 15.2 % (ref 11.5–15.5)
WBC: 11.3 10*3/uL — ABNORMAL HIGH (ref 4.0–10.5)
nRBC: 0 % (ref 0.0–0.2)

## 2019-05-01 LAB — LIPID PANEL
Cholesterol: 102 mg/dL (ref 0–200)
HDL: 25 mg/dL — ABNORMAL LOW (ref >40)
LDL Cholesterol: 57 mg/dL (ref 0–99)
Total CHOL/HDL Ratio: 4.1 RATIO
Triglycerides: 98 mg/dL (ref <150)
VLDL: 20 mg/dL (ref 0–40)

## 2019-05-01 LAB — HEMOGLOBIN A1C
Hgb A1c MFr Bld: 5.5 % (ref 4.8–5.6)
Mean Plasma Glucose: 111.15 mg/dL

## 2019-05-01 IMAGING — MR MR HEAD W/O CM
10 series · 48 of 48 positions shown · non-contrast
Comparison: [DATE] CT head, CTA and CT Perfusion.

CLINICAL DATA: 63-year-old male code stroke presentation with right
MCA bifurcation occlusion treated with endovascular reperfusion.

EXAM:
MRI HEAD WITHOUT CONTRAST
TECHNIQUE: Multiplanar, multiecho pulse sequences of the brain and surrounding
structures were obtained without intravenous contrast.

[Series 9: DWI · oblique · 3.0mm · 0.88mm/px · 9 of 98 slices shown (1 of 4)]
[im 1/98]
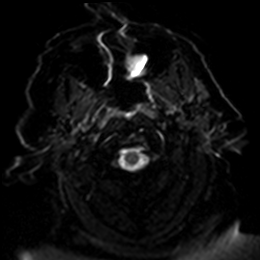
[im 13/98]
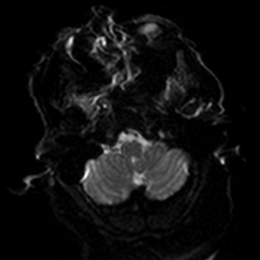
[im 25/98]
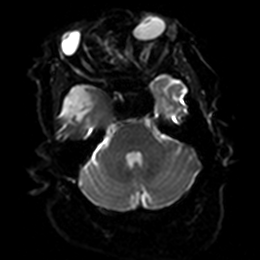
[im 37/98]
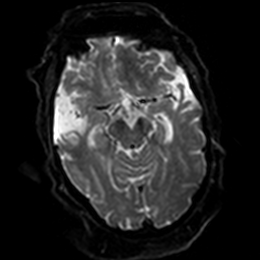
[im 49/98]
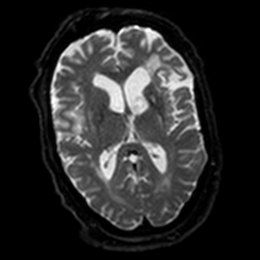
[im 61/98]
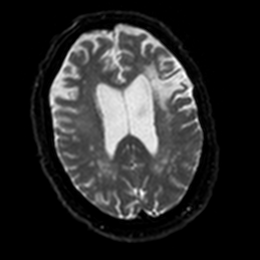
[im 73/98]
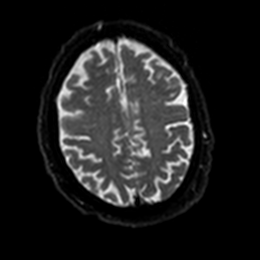
[im 85/98]
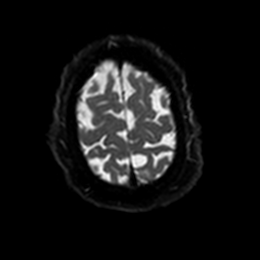
[im 98/98]
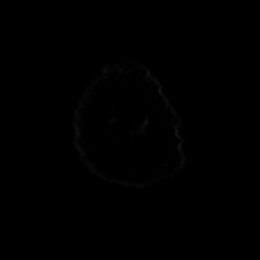

[Series 10: DWI · oblique · 3.0mm · 0.88mm/px · 5 of 49 slices shown (2 of 4)]
[im 1/49]
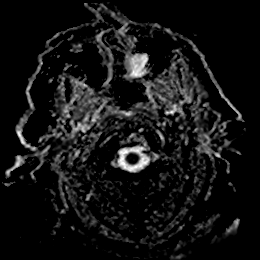
[im 13/49]
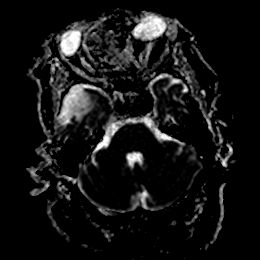
[im 25/49]
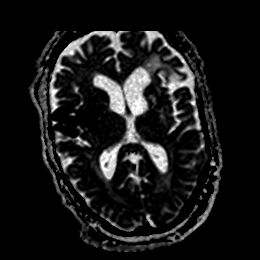
[im 37/49]
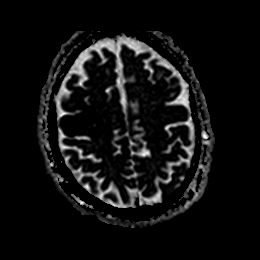
[im 49/49]
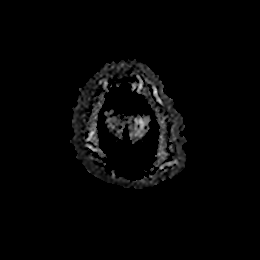

[Series 11: DWI · coronal · 4.0mm · 0.88mm/px · 8 of 72 slices shown (3 of 4)]
[im 1/72]
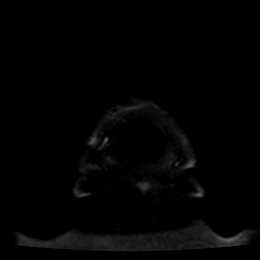
[im 11/72]
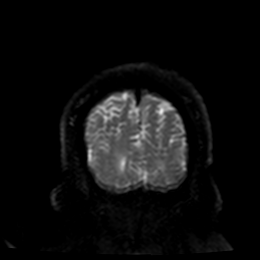
[im 21/72]
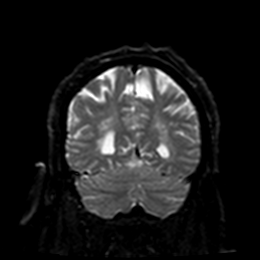
[im 31/72]
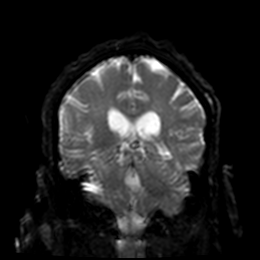
[im 41/72]
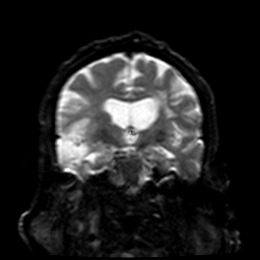
[im 51/72]
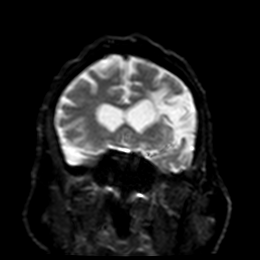
[im 61/72]
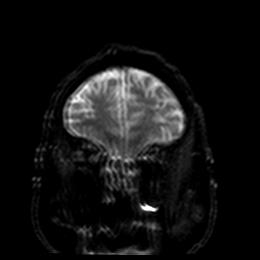
[im 72/72]
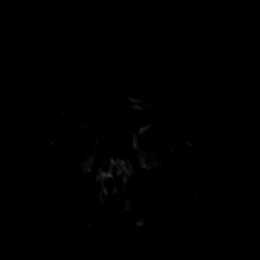

[Series 12: DWI · coronal · 4.0mm · 0.88mm/px · 4 of 36 slices shown (4 of 4)]
[im 1/36]
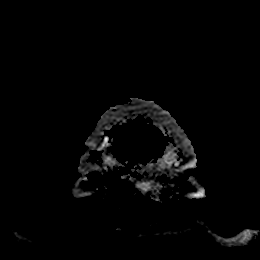
[im 12/36]
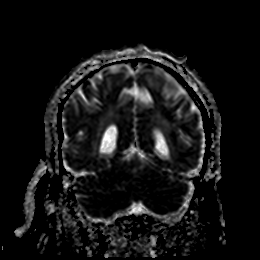
[im 24/36]
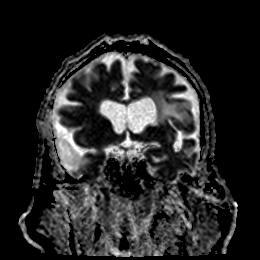
[im 36/36]
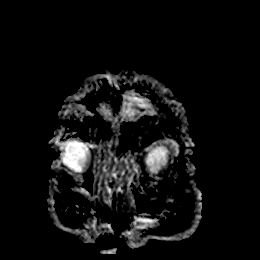

[Series 13: T1 · sagittal · 5.0mm · 0.75mm/px · 3 of 25 slices shown]
[im 1/25]
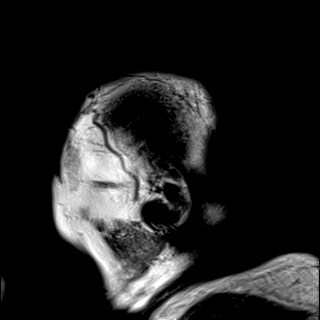
[im 13/25]
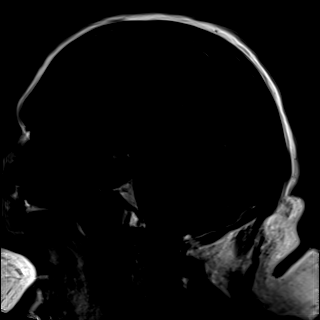
[im 25/25]
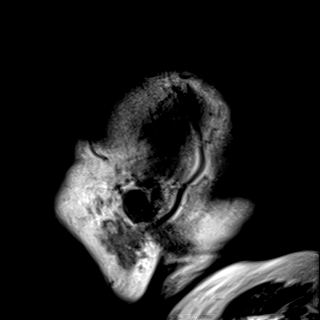

[Series 14: T2 · oblique · 5.0mm · 0.72mm/px · 3 of 25 slices shown]
[im 1/25]
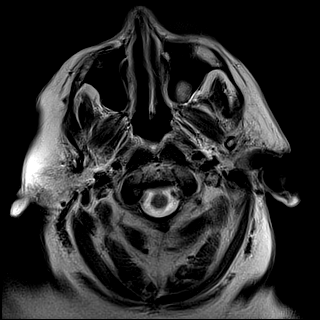
[im 13/25]
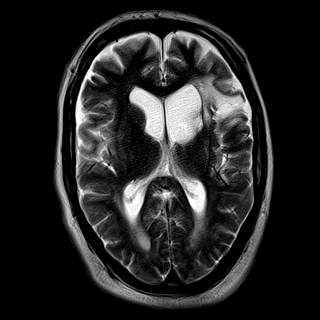
[im 25/25]
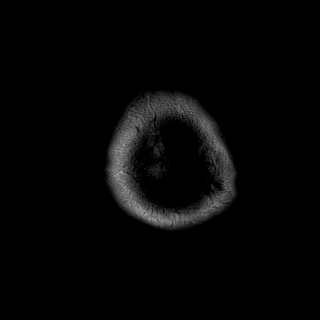

[Series 15: FLAIR · oblique · 5.0mm · 0.45mm/px · 3 of 25 slices shown (1 of 2)]
[im 1/25]
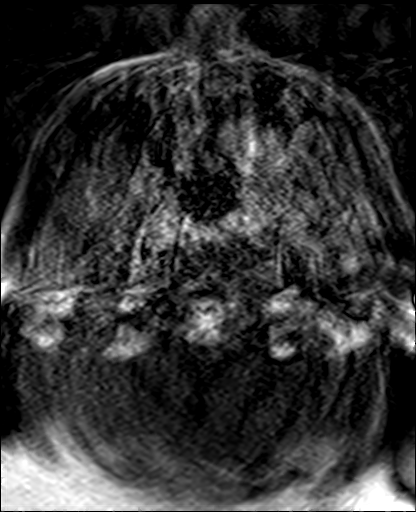
[im 13/25]
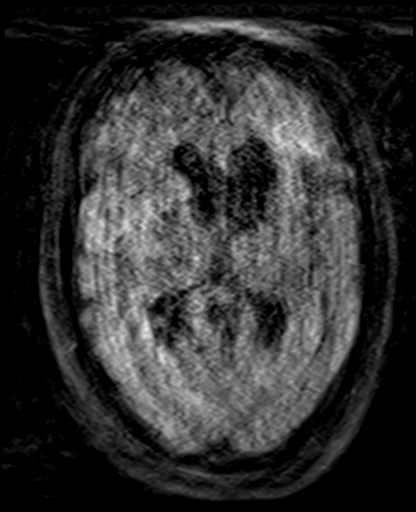
[im 25/25]
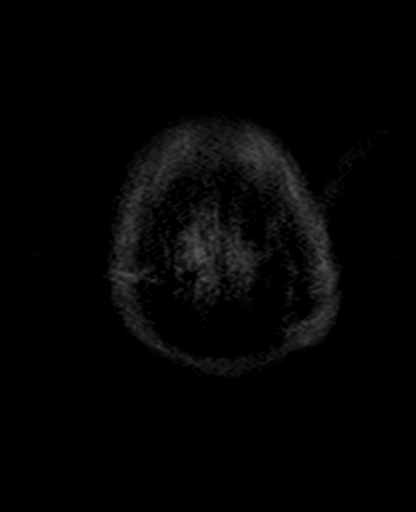

[Series 17: pha_images · oblique · 3.0mm · 0.90mm/px · 4 of 41 slices shown]
[im 1/41]
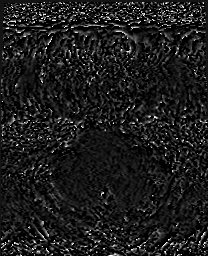
[im 14/41]
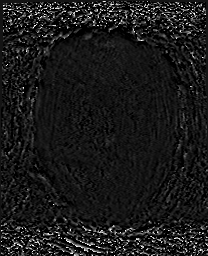
[im 27/41]
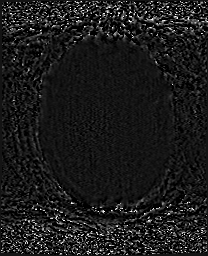
[im 41/41]
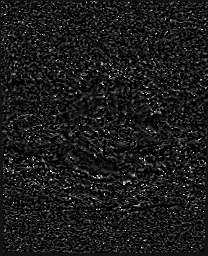

[Series 18: swi_images · oblique · 3.0mm · 0.90mm/px · 6 of 60 slices shown]
[im 1/60]
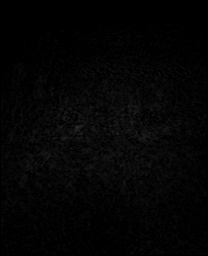
[im 12/60]
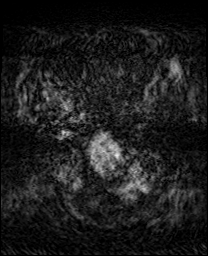
[im 24/60]
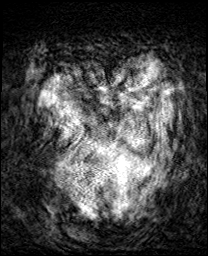
[im 36/60]
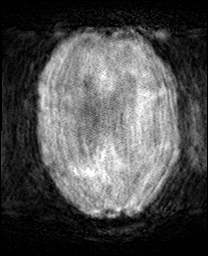
[im 48/60]
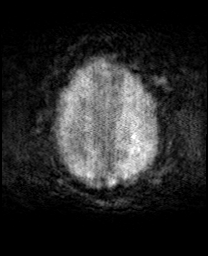
[im 60/60]
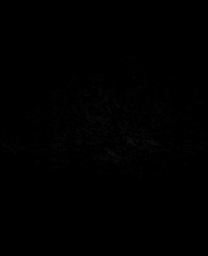

[Series 20: FLAIR · oblique · 5.0mm · 0.90mm/px · 3 of 25 slices shown (2 of 2)]
[im 1/25]
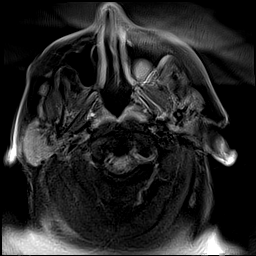
[im 13/25]
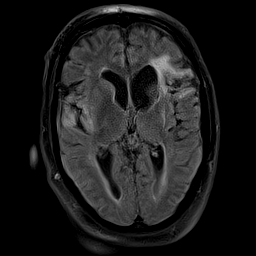
[im 25/25]
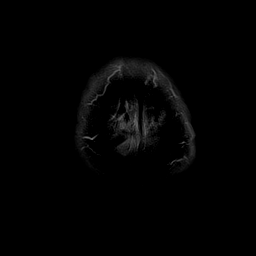

[48 of 48 positions shown; findings below may reference images not displayed]

FINDINGS: The examination had to be discontinued prior to completion due to
patient agitation, and is intermittently motion degraded. Coronal T2
and axial T1 weighted imaging was not obtained.

Brain: Restricted diffusion at the right frontal operculum and
involving a portion of the right insula, and some of the deep white
matter capsule. This encompasses an area of about 31 x 36 x 60
millimeters (approximately 33 milliliters). FLAIR more so than T2
hyperintensity in the affected parenchyma. No associated hemorrhage
is evident on motion degraded SWI. No mass effect.

Possible small area of restricted diffusion also in the right
inferior frontal gyrus on series 9, image 69. No restricted
diffusion in any other vascular territory. Chronic anterior left MCA
and right temporal lobe encephalomalacia. Mild ex vacuo ventricular
enlargement. Negative brainstem and cerebellum. No midline shift,
mass effect, evidence of mass lesion, extra-axial collection.
Cervicomedullary junction and pituitary are within normal limits.

Vascular: Major intracranial vascular flow voids are preserved.

Skull and upper cervical spine: Negative visible cervical spine.
Normal bone marrow signal.

Sinuses/Orbits: Mild rightward gaze deviation. Otherwise negative
orbits. Partially visible paranasal sinus mucosal thickening.

Other: Mastoids are clear.
IMPRESSION: 1. Truncated and intermittently motion degraded exam due to patient
agitation.
2. Right MCA territory restricted diffusion primarily involving the
operculum. No associated hemorrhage or mass effect.
3. Underlying bilateral MCA territory chronic encephalomalacia.

## 2019-05-01 IMAGING — MR MR MRA HEAD W/O CM
2 series · 19 of 48 positions shown · non-contrast
Comparison: MRI performed at the same time. CTA head and neck and
CT Perfusion [DATE].

CLINICAL DATA: 63-year-old male code stroke presentation with right
MCA bifurcation occlusion treated with endovascular reperfusion.

EXAM:
MRA HEAD WITHOUT CONTRAST
TECHNIQUE: Angiographic images of the Circle of Willis were obtained using MRA
technique without intravenous contrast.

[Series 5: 3d cow · axial · 0.5mm · 0.41mm/px · z∈[-62,+18]mm · 18 of 188 slices shown]
[im 1/188]
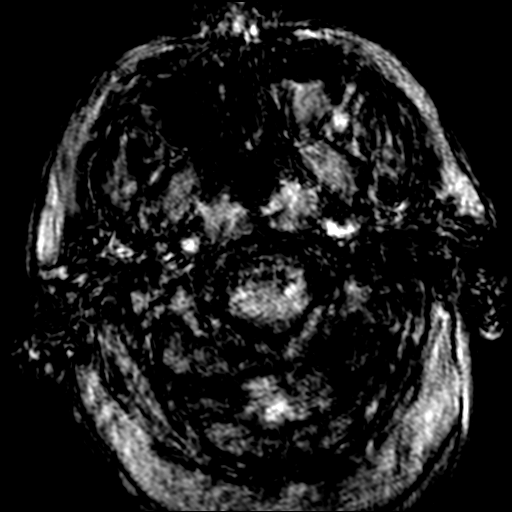
[im 5/188]
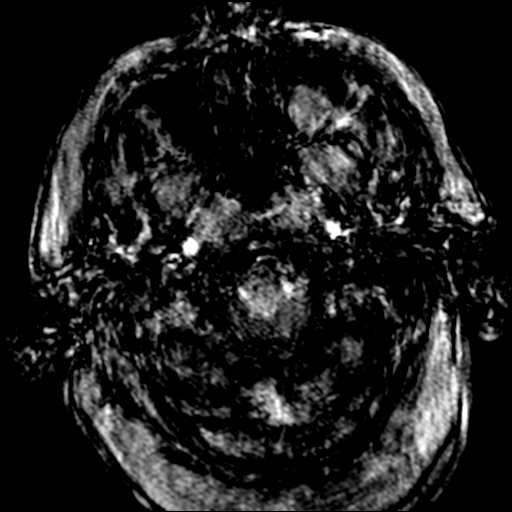
[im 9/188]
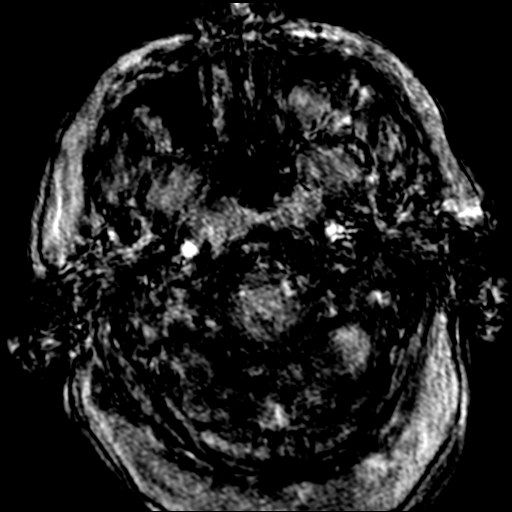
[im 13/188]
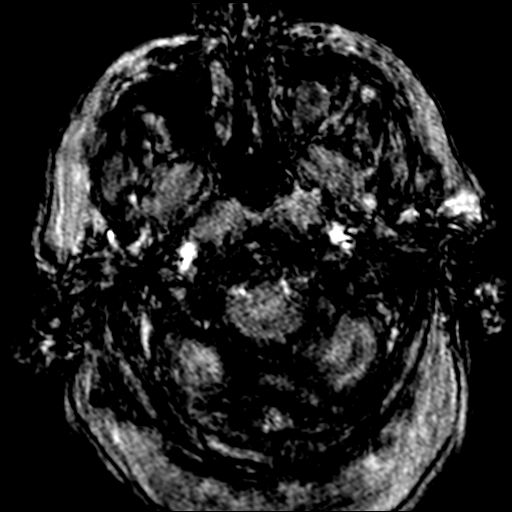
[im 17/188]
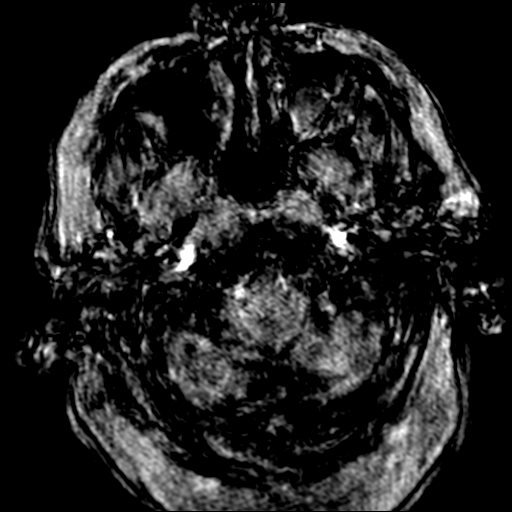
[im 21/188]
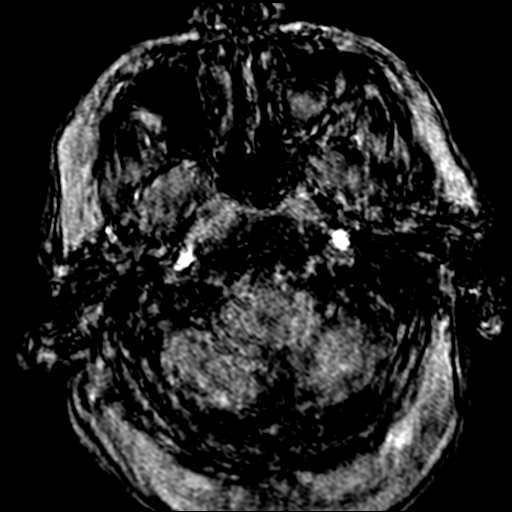
[im 25/188]
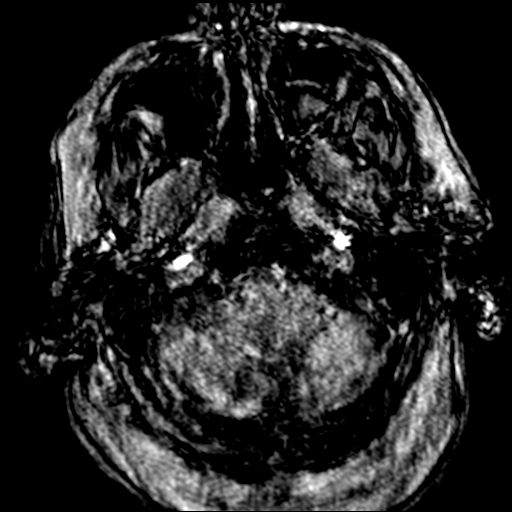
[im 29/188]
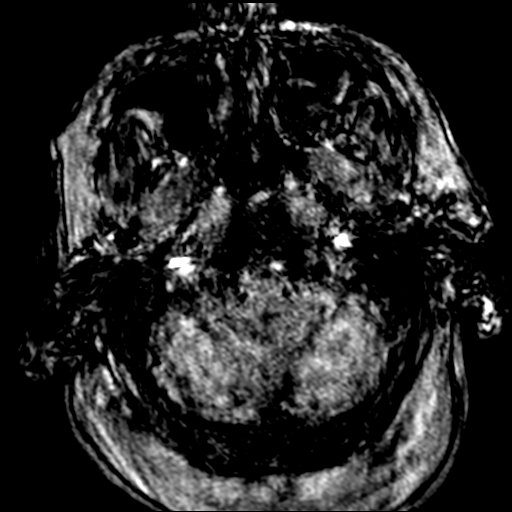
[im 33/188]
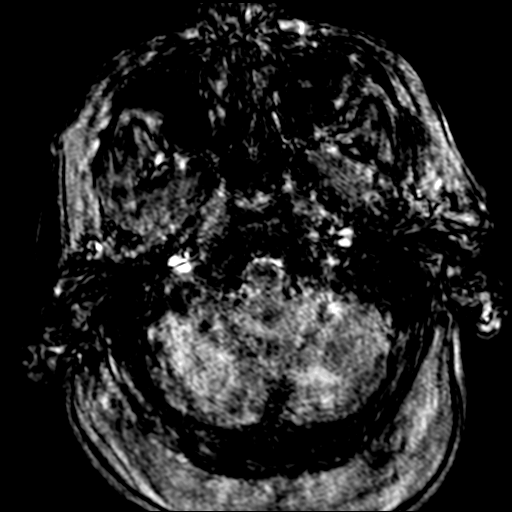
[im 37/188]
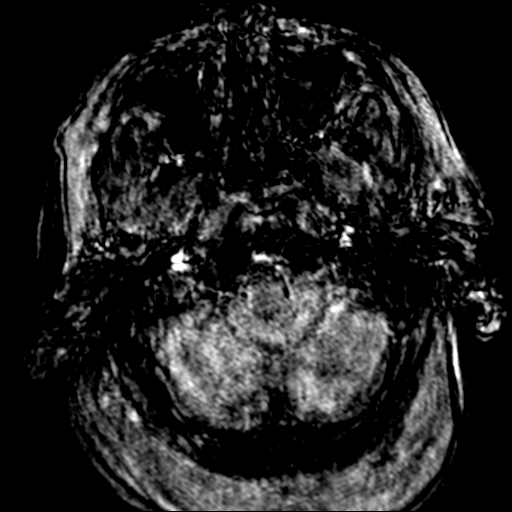
[im 57/188]
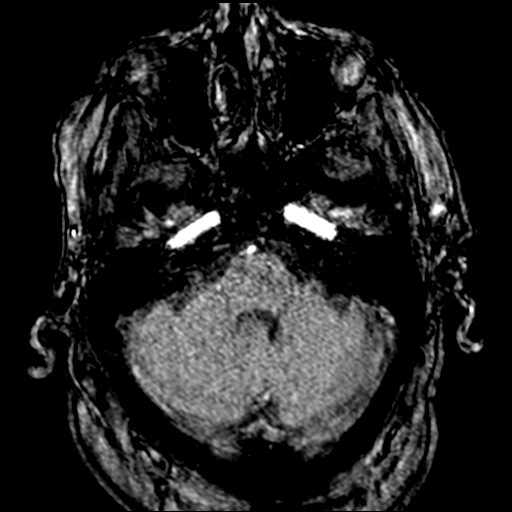
[im 82/188]
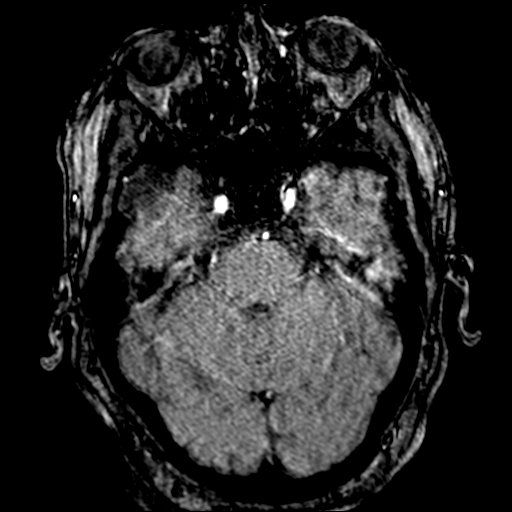
[im 94/188]
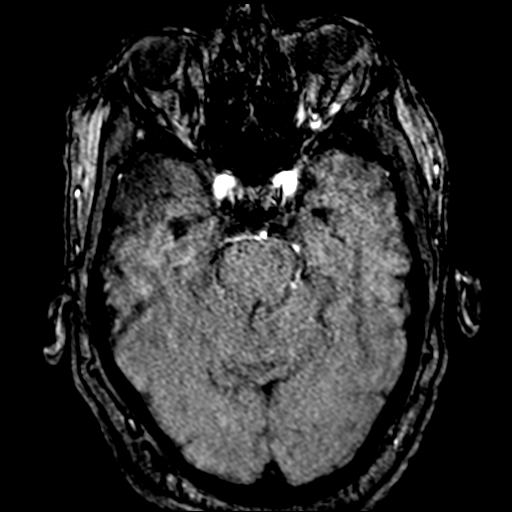
[im 106/188]
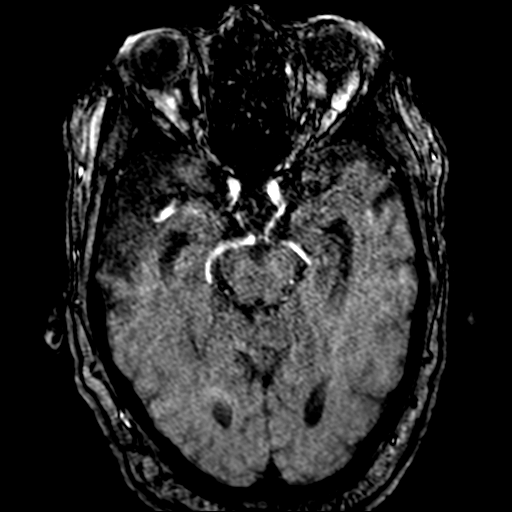
[im 131/188]
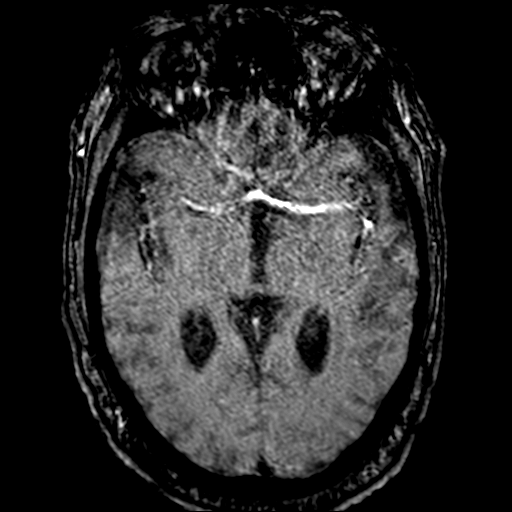
[im 155/188]
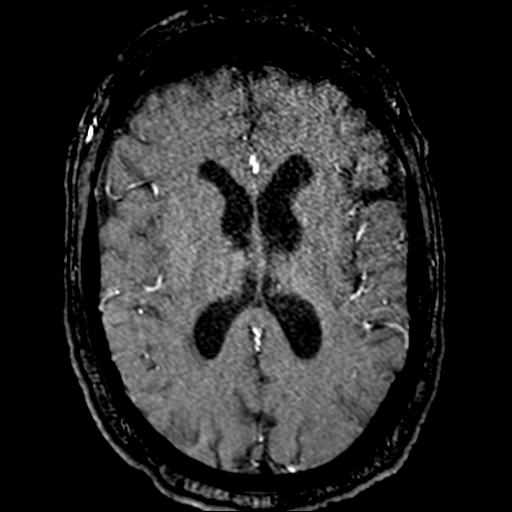
[im 159/188]
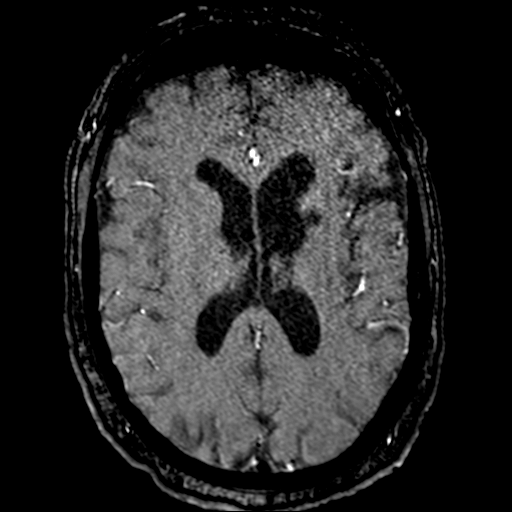
[im 179/188]
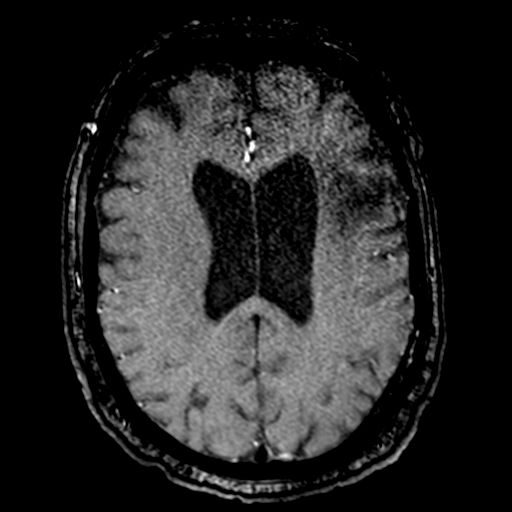

[Series 8: 3d cow_mip_tra · axial · 94.0mm · 0.41mm/px · 1 of 1 slices shown]
[im 1/1]
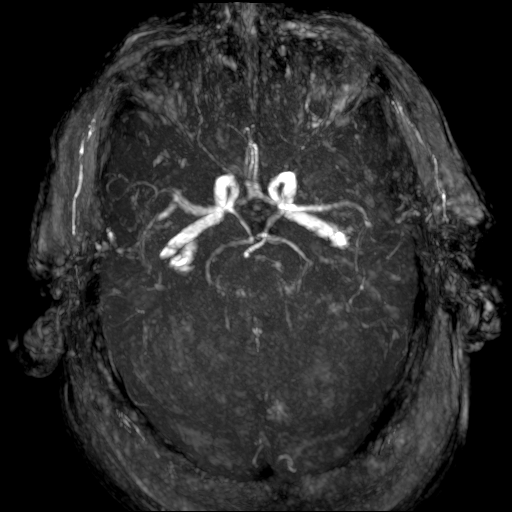

[19 of 48 positions shown; findings below may reference images not displayed]

FINDINGS: Study is mildly degraded by motion artifact despite repeated imaging
attempts.

Motion degraded distal vertebral arteries, but patent basilar artery
with no definite stenosis. The basilar functionally terminates in
the SCA is with fetal type bilateral PCA origins. Symmetric
bilateral PCA flow signal.

Motion degraded proximal ICA siphons. Both ICAs are patent with
symmetric flow signal at the carotid termini. Patent MCA and ACA
origins. Visible bilateral ACA branches are within normal limits.

Symmetric flow signal in the bilateral MCA M1 segments and at the
bifurcations.

Bilateral MCA branch detail is degraded by motion. But right MCA
branch patency appears improved from the earlier CTA.
IMPRESSION: Motion degraded exam with improved right MCA patency from the
pre-treatment CTA.

## 2019-05-01 MED ORDER — CHLORHEXIDINE GLUCONATE CLOTH 2 % EX PADS
6.0000 | MEDICATED_PAD | Freq: Every day | CUTANEOUS | Status: DC
  Administered 2019-05-01 – 2019-05-24 (×22): 6 via TOPICAL

## 2019-05-01 MED ORDER — CLEVIDIPINE BUTYRATE 0.5 MG/ML IV EMUL: 0.0000 mg/h | INTRAVENOUS | Status: DC

## 2019-05-01 NOTE — Progress Notes (Signed)
OT Cancellation Note  Patient Details Name: Kyle [name] MRN: [place]:[id number] DOB: 1955/12/14   Cancelled Treatment:    Reason Eval/Treat Not Completed: Active bedrest order Will initiate OT POC as bed rest orders are lifted and pt is available and appropriate.   Zenovia Jarred, MSOT, OTR/[facility] OT/ Acute Relief OT [facility] Office: [place] 05/01/2019, 9:05 AM

## 2019-05-01 NOTE — Progress Notes (Signed)
Referring Physician(s): * No referring provider recorded for this case *  Supervising Physician: Luanne Bras  Patient Status:  [facility] - In-pt  Chief Complaint: R MCA  Subjective: Extubated this AM.  No supplemental O2 at this time.  Patient resting comfortably.  Does not speak, but follows simple commands.  Not moving left hand/arm.   Allergies: Vioxx [rofecoxib]  Medications: Prior to Admission medications   Medication Sig Start Date End Date Taking? Authorizing Provider  albuterol (PROVENTIL HFA;VENTOLIN HFA) 108 (90 Base) MCG/ACT inhaler Inhale 2 puffs into the lungs every 4 (four) hours as needed for wheezing or shortness of breath. 08/30/15   Gildardo Cranker, DO  amitriptyline (ELAVIL) 50 MG tablet TAKE 1 TABLET BY MOUTH EVERYDAY AT BEDTIME Patient taking differently: Take 50 mg by mouth at bedtime.  10/14/18   Pleas Koch, NP  aspirin EC 81 MG tablet Take 81 mg by mouth daily.    [provider]  atorvastatin (LIPITOR) 20 MG tablet Take 1 tablet (20 mg total) by mouth daily. For cholesterol. Patient not taking: Reported on 04/30/2019 08/24/18   Pleas Koch, NP  atorvastatin (LIPITOR) 40 MG tablet Take 40 mg by mouth daily. 04/20/19   [provider]  gabapentin (NEURONTIN) 600 MG tablet TAKE 1 TABLET BY MOUTH THREE TIMES A DAY Patient taking differently: Take 600 mg by mouth 4 (four) times daily.  10/11/18   Pleas Koch, NP  lisinopril (PRINIVIL,ZESTRIL) 20 MG tablet Take 1 tablet (20 mg total) by mouth daily. For blood pressure. Patient not taking: Reported on 04/30/2019 08/16/18   Pleas Koch, NP  lisinopril (ZESTRIL) 10 MG tablet Take 10 mg by mouth daily. 04/20/19   [provider]  metoprolol succinate (TOPROL-XL) 100 MG 24 hr tablet Take 100 mg by mouth daily. 01/19/19   [provider]  nystatin cream (MYCOSTATIN)  04/20/19   [provider]  oxyCODONE (OXY IR/ROXICODONE) 5 MG immediate release tablet  Take 5 mg by mouth 4 (four) times daily as needed (pain).  01/19/19   [provider]  pantoprazole (PROTONIX) 40 MG tablet Take 40 mg by mouth daily. 01/19/19   [provider]  PARoxetine (PAXIL) 10 MG tablet Take 10 mg by mouth daily. 01/19/19   [provider]  metoprolol tartrate (LOPRESSOR) 25 MG tablet Take 1 tablet (25 mg total) by mouth 2 (two) times daily. For blood pressure and heart rate. Patient not taking: Reported on 01/21/2019 09/15/18 01/21/19  Pleas Koch, NP  rivaroxaban (XARELTO) 20 MG TABS tablet Take 1 tablet (20 mg total) by mouth daily with supper. For blood clot prevention. Patient not taking: Reported on 10/03/2018 08/24/18 01/21/19  Pleas Koch, NP     Vital Signs: BP (!) 151/62    Pulse 85    Temp 98.4 F (36.9 C) (Oral)    Resp 16    Ht 6\' 3"  (1.905 m)    Wt (!) 347 lb 0.1 oz (157.4 kg)    SpO2 95%    BMI 43.37 kg/m   Physical Exam  NAD, alert Neuro:  Alert, follows simple commands. PERRL, mouth gaping open, moving right arm and hand, no movement in left hand. Moves bilateral lower extremities.  Groin: soft, intact.  No hematoma or pseudoaneurysm.  Imaging: Ct Code Stroke Cta Head W/wo Contrast  Result Date: 04/30/2019 CLINICAL DATA:  Stroke. Left-sided weakness. Atrial fibrillation off anticoagulation. EXAM: CT ANGIOGRAPHY HEAD AND NECK CT PERFUSION BRAIN TECHNIQUE: Multidetector CT  imaging of the head and neck was performed using the standard protocol during bolus administration of intravenous contrast. Multiplanar CT image reconstructions and MIPs were obtained to evaluate the vascular anatomy. Carotid stenosis measurements (when applicable) are obtained utilizing NASCET criteria, using the distal internal carotid diameter as the denominator. Multiphase CT imaging of the brain was performed following IV bolus contrast injection. Subsequent parametric perfusion maps were calculated using RAPID software. CONTRAST:  159mL OMNIPAQUE IOHEXOL  350 MG/ML SOLN COMPARISON:  CT head 04/30/2019 FINDINGS: CTA NECK FINDINGS Aortic arch: Suboptimal arterial opacification. Contrast is in the venous phase with limited arterial contrast. Atherosclerotic disease in the aorta. Proximal great vessels patent. Image interpretation delayed as patient was registered under the wrong name. Right carotid system: Mild atherosclerotic disease right carotid bifurcation without significant stenosis Left carotid system: Mild atherosclerotic disease left bifurcation without significant stenosis. Vertebral arteries: Both vertebral arteries are patent. Limited contrast opacification. Skeleton: No acute abnormality. Other neck: Negative for mass or adenopathy. Upper chest: Negative Review of the MIP images confirms the above findings CTA HEAD FINDINGS Anterior circulation: Mild atherosclerotic disease in the cavernous carotid bilaterally. Right MCA occlusion at the bifurcation. Chronic infarct right temporal lobe. Both anterior cerebral arteries patent. Left middle cerebral artery widely patent. Posterior circulation: Vertebral arteries not well visualized. Basilar artery not well visualized due to lack of adequate contrast. Fetal origin of the posterior cerebral arteries bilaterally Venous sinuses: No venous contrast present. Anatomic variants: None Review of the MIP images confirms the above findings CT Brain Perfusion Findings: ASPECTS: 10 CBF (<30%) Volume: 66mL Perfusion (Tmax>6.0s) volume: 214mL Mismatch Volume: 02/01/2019mL Infarction Location:Right MCA territory involving the right lateral temporal lobe and parietal lobe. IMPRESSION: Suboptimal CTA. Delayed interpretation due to registration under the wrong name. Very early arterial phase study with limited arterial opacification in the CTA. As best I can tell both carotid arteries are patent in the neck. There is occlusion of the right MCA at the bifurcation. CT perfusion markedly abnormal with large area of delayed perfusion  right MCA territory. 219 mL of delayed perfusion. No core infarct. These results were called by telephone at the time of interpretation on 04/30/2019 at 12:30 pm to provider Aroor, who verbally acknowledged these results. Electronically Signed   By: Franchot Gallo M.D.   On: 04/30/2019 12:33   Ct Code Stroke Cta Neck W/wo Contrast  Result Date: 04/30/2019 CLINICAL DATA:  Stroke. Left-sided weakness. Atrial fibrillation off anticoagulation. EXAM: CT ANGIOGRAPHY HEAD AND NECK CT PERFUSION BRAIN TECHNIQUE: Multidetector CT imaging of the head and neck was performed using the standard protocol during bolus administration of intravenous contrast. Multiplanar CT image reconstructions and MIPs were obtained to evaluate the vascular anatomy. Carotid stenosis measurements (when applicable) are obtained utilizing NASCET criteria, using the distal internal carotid diameter as the denominator. Multiphase CT imaging of the brain was performed following IV bolus contrast injection. Subsequent parametric perfusion maps were calculated using RAPID software. CONTRAST:  181mL OMNIPAQUE IOHEXOL 350 MG/ML SOLN COMPARISON:  CT head 04/30/2019 FINDINGS: CTA NECK FINDINGS Aortic arch: Suboptimal arterial opacification. Contrast is in the venous phase with limited arterial contrast. Atherosclerotic disease in the aorta. Proximal great vessels patent. Image interpretation delayed as patient was registered under the wrong name. Right carotid system: Mild atherosclerotic disease right carotid bifurcation without significant stenosis Left carotid system: Mild atherosclerotic disease left bifurcation without significant stenosis. Vertebral arteries: Both vertebral arteries are patent. Limited contrast opacification. Skeleton: No acute abnormality. Other neck: Negative for mass or adenopathy.  Upper chest: Negative Review of the MIP images confirms the above findings CTA HEAD FINDINGS Anterior circulation: Mild atherosclerotic disease in the  cavernous carotid bilaterally. Right MCA occlusion at the bifurcation. Chronic infarct right temporal lobe. Both anterior cerebral arteries patent. Left middle cerebral artery widely patent. Posterior circulation: Vertebral arteries not well visualized. Basilar artery not well visualized due to lack of adequate contrast. Fetal origin of the posterior cerebral arteries bilaterally Venous sinuses: No venous contrast present. Anatomic variants: None Review of the MIP images confirms the above findings CT Brain Perfusion Findings: ASPECTS: 10 CBF (<30%) Volume: 89mL Perfusion (Tmax>6.0s) volume: 271mL Mismatch Volume: 02/01/2019mL Infarction Location:Right MCA territory involving the right lateral temporal lobe and parietal lobe. IMPRESSION: Suboptimal CTA. Delayed interpretation due to registration under the wrong name. Very early arterial phase study with limited arterial opacification in the CTA. As best I can tell both carotid arteries are patent in the neck. There is occlusion of the right MCA at the bifurcation. CT perfusion markedly abnormal with large area of delayed perfusion right MCA territory. 219 mL of delayed perfusion. No core infarct. These results were called by telephone at the time of interpretation on 04/30/2019 at 12:30 pm to provider Aroor, who verbally acknowledged these results. Electronically Signed   By: Franchot Gallo M.D.   On: 04/30/2019 12:33   Mr Virgel Paling [id number] Contrast  Result Date: 05/01/2019 CLINICAL DATA:  63 year old male code stroke presentation with right MCA bifurcation occlusion treated with endovascular reperfusion. EXAM: MRA HEAD WITHOUT CONTRAST TECHNIQUE: Angiographic images of the Circle of Willis were obtained using MRA technique without intravenous contrast. COMPARISON:  MRI performed at the same time. CTA head and neck and CT Perfusion 04/30/2019. FINDINGS: Study is mildly degraded by motion artifact despite repeated imaging attempts. Motion degraded distal vertebral  arteries, but patent basilar artery with no definite stenosis. The basilar functionally terminates in the SCA is with fetal type bilateral PCA origins. Symmetric bilateral PCA flow signal. Motion degraded proximal ICA siphons. Both ICAs are patent with symmetric flow signal at the carotid termini. Patent MCA and ACA origins. Visible bilateral ACA branches are within normal limits. Symmetric flow signal in the bilateral MCA M1 segments and at the bifurcations. Bilateral MCA branch detail is degraded by motion. But right MCA branch patency appears improved from the earlier CTA. IMPRESSION: Motion degraded exam with improved right MCA patency from the pre-treatment CTA. Electronically Signed   By: Genevie Ann M.D.   On: 05/01/2019 03:43   Mr Brain Wo Contrast  Result Date: 05/01/2019 CLINICAL DATA:  64 year old male code stroke presentation with right MCA bifurcation occlusion treated with endovascular reperfusion. EXAM: MRI HEAD WITHOUT CONTRAST TECHNIQUE: Multiplanar, multiecho pulse sequences of the brain and surrounding structures were obtained without intravenous contrast. COMPARISON:  04/30/2019 CT head, CTA and CT Perfusion. FINDINGS: The examination had to be discontinued prior to completion due to patient agitation, and is intermittently motion degraded. Coronal T2 and axial T1 weighted imaging was not obtained. Brain: Restricted diffusion at the right frontal operculum and involving a portion of the right insula, and some of the deep white matter capsule. This encompasses an area of about 31 x 36 x 60 millimeters (approximately 33 milliliters). FLAIR more so than T2 hyperintensity in the affected parenchyma. No associated hemorrhage is evident on motion degraded SWI. No mass effect. Possible small area of restricted diffusion also in the right inferior frontal gyrus on series 9, image 69. No restricted diffusion in any other vascular territory.  Chronic anterior left MCA and right temporal lobe  encephalomalacia. Mild ex vacuo ventricular enlargement. Negative brainstem and cerebellum. No midline shift, mass effect, evidence of mass lesion, extra-axial collection. Cervicomedullary junction and pituitary are within normal limits. Vascular: Major intracranial vascular flow voids are preserved. Skull and upper cervical spine: Negative visible cervical spine. Normal bone marrow signal. Sinuses/Orbits: Mild rightward gaze deviation. Otherwise negative orbits. Partially visible paranasal sinus mucosal thickening. Other: Mastoids are clear. IMPRESSION: 1. Truncated and intermittently motion degraded exam due to patient agitation. 2. Right MCA territory restricted diffusion primarily involving the operculum. No associated hemorrhage or mass effect. 3. Underlying bilateral MCA territory chronic encephalomalacia. Electronically Signed   By: Genevie Ann M.D.   On: 05/01/2019 03:39   Ct Code Stroke Cerebral Perfusion With Contrast  Result Date: 04/30/2019 CLINICAL DATA:  Stroke. Left-sided weakness. Atrial fibrillation off anticoagulation. EXAM: CT ANGIOGRAPHY HEAD AND NECK CT PERFUSION BRAIN TECHNIQUE: Multidetector CT imaging of the head and neck was performed using the standard protocol during bolus administration of intravenous contrast. Multiplanar CT image reconstructions and MIPs were obtained to evaluate the vascular anatomy. Carotid stenosis measurements (when applicable) are obtained utilizing NASCET criteria, using the distal internal carotid diameter as the denominator. Multiphase CT imaging of the brain was performed following IV bolus contrast injection. Subsequent parametric perfusion maps were calculated using RAPID software. CONTRAST:  148mL OMNIPAQUE IOHEXOL 350 MG/ML SOLN COMPARISON:  CT head 04/30/2019 FINDINGS: CTA NECK FINDINGS Aortic arch: Suboptimal arterial opacification. Contrast is in the venous phase with limited arterial contrast. Atherosclerotic disease in the aorta. Proximal great  vessels patent. Image interpretation delayed as patient was registered under the wrong name. Right carotid system: Mild atherosclerotic disease right carotid bifurcation without significant stenosis Left carotid system: Mild atherosclerotic disease left bifurcation without significant stenosis. Vertebral arteries: Both vertebral arteries are patent. Limited contrast opacification. Skeleton: No acute abnormality. Other neck: Negative for mass or adenopathy. Upper chest: Negative Review of the MIP images confirms the above findings CTA HEAD FINDINGS Anterior circulation: Mild atherosclerotic disease in the cavernous carotid bilaterally. Right MCA occlusion at the bifurcation. Chronic infarct right temporal lobe. Both anterior cerebral arteries patent. Left middle cerebral artery widely patent. Posterior circulation: Vertebral arteries not well visualized. Basilar artery not well visualized due to lack of adequate contrast. Fetal origin of the posterior cerebral arteries bilaterally Venous sinuses: No venous contrast present. Anatomic variants: None Review of the MIP images confirms the above findings CT Brain Perfusion Findings: ASPECTS: 10 CBF (<30%) Volume: 45mL Perfusion (Tmax>6.0s) volume: 274mL Mismatch Volume: 02/01/2019mL Infarction Location:Right MCA territory involving the right lateral temporal lobe and parietal lobe. IMPRESSION: Suboptimal CTA. Delayed interpretation due to registration under the wrong name. Very early arterial phase study with limited arterial opacification in the CTA. As best I can tell both carotid arteries are patent in the neck. There is occlusion of the right MCA at the bifurcation. CT perfusion markedly abnormal with large area of delayed perfusion right MCA territory. 219 mL of delayed perfusion. No core infarct. These results were called by telephone at the time of interpretation on 04/30/2019 at 12:30 pm to provider Aroor, who verbally acknowledged these results. Electronically  Signed   By: Franchot Gallo M.D.   On: 04/30/2019 12:33   Dg Chest Port 1 View  Result Date: 04/30/2019 CLINICAL DATA:  Stroke EXAM: PORTABLE CHEST 1 VIEW COMPARISON:  08/25/2015 FINDINGS: Cardiomegaly. Both lungs are clear. The visualized skeletal structures are unremarkable. IMPRESSION: Cardiomegaly without acute abnormality of the  lungs in AP portable projection. Electronically Signed   By: Eddie Candle M.D.   On: 04/30/2019 16:07   Ct Head Code Stroke Wo Contrast  Result Date: 04/30/2019 CLINICAL DATA:  Code stroke.  Left-sided weakness. EXAM: CT HEAD WITHOUT CONTRAST TECHNIQUE: Contiguous axial images were obtained from the base of the skull through the vertex without intravenous contrast. COMPARISON:  None. FINDINGS: Brain: Chronic infarct right lateral temporal lobe. Chronic infarct left frontal lobe. Chronic infarct anterior limb internal capsule on the left. Negative for acute infarct. Negative for hemorrhage or mass. Mild ventricular enlargement most likely due to mild atrophy. Vascular: Hyperdense right MCA at the bifurcation. Probable acute thrombus. Skull: Negative Sinuses/Orbits: Cyst left maxillary sinus otherwise sinuses clear. Negative orbit. Other: None ASPECTS ([place] Stroke Program Early CT Score) - Ganglionic level infarction (caudate, lentiform nuclei, internal capsule, insula, M1-M3 cortex): 7 - Supraganglionic infarction (M4-M6 cortex): 3 Total score (0-10 with 10 being normal): 10 IMPRESSION: 1. Probable hyperdense right MCA bifurcation.  No acute infarct. 2. Atrophy with chronic right temporal infarct and chronic left frontal infarct. 3. ASPECTS is 10 4. Results texted to Dr. Lorraine Lax Electronically Signed   By: Franchot Gallo M.D.   On: 04/30/2019 11:54    Labs:  CBC: Recent Labs    01/22/19 0115 04/23/19 1734 04/30/19 1152 04/30/19 1209 05/01/19 0630  WBC 6.1 10.5 9.2  --  11.3*  HGB 11.9* 15.4 15.5 16.7 15.4  HCT 37.9* 47.8 48.0 49.0 46.5  PLT 211 273 241  --  157     COAGS: Recent Labs    01/21/19 1942 01/22/19 0115 04/30/19 1152  INR 1.3* 1.3* 1.1  APTT  --   --  35    BMP: Recent Labs    01/21/19 1942 04/23/19 1734 04/30/19 1152 04/30/19 1209 05/01/19 0630  NA 137 138 139 140 136  K 3.5 3.9 3.6 3.6 4.2  CL 101 101 102 102 101  CO2 25 23 23   --  19*  GLUCOSE 100* 99 113* 109* 113*  BUN 12 25* 18 21 12   CALCIUM 9.1 9.8 9.4  --  8.8*  CREATININE 0.90 0.88 0.89 0.80 0.73  GFRNONAA >60 >60 >60  --  >60  GFRAA >60 >60 >60  --  >60    LIVER FUNCTION TESTS: Recent Labs    08/16/18 1640 04/23/19 1734 04/30/19 1152  BILITOT 0.5 1.8* 1.0  AST 25 39 24  ALT 12 19 16   ALKPHOS 73 84 77  PROT 7.7 7.7 7.2  ALBUMIN 4.1 3.9 3.5    Assessment and Plan: 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  He has been weaned to room air.  Follows commands but anarthric.  Moves right upper and lower extremities.  Moving left lower extremity but not left arm/hand.  Groin stable.  Plans per Neuro and CCM.  IR available if needed.   Electronically Signed: Docia Barrier, PA 05/01/2019, 2:34 PM   I spent a total of 15 Minutes at the the patient's bedside AND on the patient's hospital floor or unit, greater than 50% of which was counseling/coordinating care for R MCA infarct.

## 2019-05-01 NOTE — Progress Notes (Signed)
STROKE TEAM PROGRESS NOTE   INTERVAL HISTORY Pt RN at bedside. Pt lying in bed, anarthric, but following all simple commands. No gaze deviation. Left UE 3/5 and LLE 4/5. MRI and MRA showed right MCA infarct and right MCA patent. Did not pass swallow. Still on cleviprex.   Vitals:   05/01/19 0615 05/01/19 0630 05/01/19 0645 05/01/19 0700  BP: 135/61 122/68 130/62 112/75  Pulse: 87 95 99 (!) 110  Resp: _0 Temp:      TempSrc:      SpO2: 95% 96% 95% 94%  Weight:      Height:        CBC:  Recent Labs  Lab 04/30/19 1152 04/30/19 1209 05/01/19 0630  WBC 9.2  --  11.3*  NEUTROABS 7.0  --  9.2*  HGB 15.5 16.7 15.4  HCT 48.0 49.0 46.5  MCV 85.6  --  84.2  PLT 241  --  665    Basic Metabolic Panel:  Recent Labs  Lab 04/30/19 1152 04/30/19 1209 05/01/19 0630  NA 139 140 136  K 3.6 3.6 4.2  CL 102 102 101  CO2 23  --  19*  GLUCOSE 113* 109* 113*  BUN _1 CREATININE 0.89 0.80 0.73  CALCIUM 9.4  --  8.8*   Lipid Panel:     Component Value Date/Time   CHOL 163 08/16/2018 1640   CHOL 142 12/19/2014 0804   TRIG 75.0 08/16/2018 1640   HDL 33.80 (L) 08/16/2018 1640   HDL 29 (L) 12/19/2014 0804   CHOLHDL 5 08/16/2018 1640   VLDL 15.0 08/16/2018 1640   LDLCALC 114 (H) 08/16/2018 1640   LDLCALC 93 12/19/2014 0804   HgbA1c:  Lab Results  Component Value Date   HGBA1C 5.5 06/25/2014   Urine Drug Screen:     Component Value Date/Time   LABOPIA NONE DETECTED 04/30/2019 1507   COCAINSCRNUR NONE DETECTED 04/30/2019 1507   COCAINSCRNUR Negative 03/15/2015 1141   LABBENZ NONE DETECTED 04/30/2019 1507   AMPHETMU POSITIVE (A) 04/30/2019 1507   THCU NONE DETECTED 04/30/2019 1507   LABBARB NONE DETECTED 04/30/2019 1507    Alcohol Level     Component Value Date/Time   ETH <10 04/30/2019 1152    IMAGING Ct Code Stroke Cta Head W/wo Contrast  Result Date: 04/30/2019 CLINICAL DATA:  Stroke. Left-sided weakness. Atrial fibrillation off anticoagulation.  EXAM: CT ANGIOGRAPHY HEAD AND NECK CT PERFUSION BRAIN TECHNIQUE: Multidetector CT imaging of the head and neck was performed using the standard protocol during bolus administration of intravenous contrast. Multiplanar CT image reconstructions and MIPs were obtained to evaluate the vascular anatomy. Carotid stenosis measurements (when applicable) are obtained utilizing NASCET criteria, using the distal internal carotid diameter as the denominator. Multiphase CT imaging of the brain was performed following IV bolus contrast injection. Subsequent parametric perfusion maps were calculated using RAPID software. CONTRAST:  155m OMNIPAQUE IOHEXOL 350 MG/ML SOLN COMPARISON:  CT head 04/30/2019 FINDINGS: CTA NECK FINDINGS Aortic arch: Suboptimal arterial opacification. Contrast is in the venous phase with limited arterial contrast. Atherosclerotic disease in the aorta. Proximal great vessels patent. Image interpretation delayed as patient was registered under the wrong name. Right carotid system: Mild atherosclerotic disease right carotid bifurcation without significant stenosis Left carotid system: Mild atherosclerotic disease left bifurcation without significant stenosis. Vertebral arteries: Both vertebral arteries are patent. Limited contrast opacification. Skeleton: No acute abnormality. Other neck: Negative for mass or adenopathy. Upper chest: Negative Review of the MIP images  confirms the above findings CTA HEAD FINDINGS Anterior circulation: Mild atherosclerotic disease in the cavernous carotid bilaterally. Right MCA occlusion at the bifurcation. Chronic infarct right temporal lobe. Both anterior cerebral arteries patent. Left middle cerebral artery widely patent. Posterior circulation: Vertebral arteries not well visualized. Basilar artery not well visualized due to lack of adequate contrast. Fetal origin of the posterior cerebral arteries bilaterally Venous sinuses: No venous contrast present. Anatomic variants:  None Review of the MIP images confirms the above findings CT Brain Perfusion Findings: ASPECTS: 10 CBF (<30%) Volume: 38m Perfusion (Tmax>6.0s) volume: 2145mMismatch Volume: 02/01/2019mL Infarction Location:Right MCA territory involving the right lateral temporal lobe and parietal lobe. IMPRESSION: Suboptimal CTA. Delayed interpretation due to registration under the wrong name. Very early arterial phase study with limited arterial opacification in the CTA. As best I can tell both carotid arteries are patent in the neck. There is occlusion of the right MCA at the bifurcation. CT perfusion markedly abnormal with large area of delayed perfusion right MCA territory. 219 mL of delayed perfusion. No core infarct. These results were called by telephone at the time of interpretation on 04/30/2019 at 12:30 pm to provider Aroor, who verbally acknowledged these results. Electronically Signed   By: ChFranchot Gallo.D.   On: 04/30/2019 12:33   Ct Code Stroke Cta Neck W/wo Contrast  Result Date: 04/30/2019 CLINICAL DATA:  Stroke. Left-sided weakness. Atrial fibrillation off anticoagulation. EXAM: CT ANGIOGRAPHY HEAD AND NECK CT PERFUSION BRAIN TECHNIQUE: Multidetector CT imaging of the head and neck was performed using the standard protocol during bolus administration of intravenous contrast. Multiplanar CT image reconstructions and MIPs were obtained to evaluate the vascular anatomy. Carotid stenosis measurements (when applicable) are obtained utilizing NASCET criteria, using the distal internal carotid diameter as the denominator. Multiphase CT imaging of the brain was performed following IV bolus contrast injection. Subsequent parametric perfusion maps were calculated using RAPID software. CONTRAST:  10010mMNIPAQUE IOHEXOL 350 MG/ML SOLN COMPARISON:  CT head 04/30/2019 FINDINGS: CTA NECK FINDINGS Aortic arch: Suboptimal arterial opacification. Contrast is in the venous phase with limited arterial contrast.  Atherosclerotic disease in the aorta. Proximal great vessels patent. Image interpretation delayed as patient was registered under the wrong name. Right carotid system: Mild atherosclerotic disease right carotid bifurcation without significant stenosis Left carotid system: Mild atherosclerotic disease left bifurcation without significant stenosis. Vertebral arteries: Both vertebral arteries are patent. Limited contrast opacification. Skeleton: No acute abnormality. Other neck: Negative for mass or adenopathy. Upper chest: Negative Review of the MIP images confirms the above findings CTA HEAD FINDINGS Anterior circulation: Mild atherosclerotic disease in the cavernous carotid bilaterally. Right MCA occlusion at the bifurcation. Chronic infarct right temporal lobe. Both anterior cerebral arteries patent. Left middle cerebral artery widely patent. Posterior circulation: Vertebral arteries not well visualized. Basilar artery not well visualized due to lack of adequate contrast. Fetal origin of the posterior cerebral arteries bilaterally Venous sinuses: No venous contrast present. Anatomic variants: None Review of the MIP images confirms the above findings CT Brain Perfusion Findings: ASPECTS: 10 CBF (<30%) Volume: 0mL44mrfusion (Tmax>6.0s) volume: 219mL13mmatch Volume: 02/01/2019mL Infarction Location:Right MCA territory involving the right lateral temporal lobe and parietal lobe. IMPRESSION: Suboptimal CTA. Delayed interpretation due to registration under the wrong name. Very early arterial phase study with limited arterial opacification in the CTA. As best I can tell both carotid arteries are patent in the neck. There is occlusion of the right MCA at the bifurcation. CT perfusion markedly abnormal with large area of  delayed perfusion right MCA territory. 219 mL of delayed perfusion. No core infarct. These results were called by telephone at the time of interpretation on 04/30/2019 at 12:30 pm to provider Aroor, who  verbally acknowledged these results. Electronically Signed   By: Franchot Gallo M.D.   On: 04/30/2019 12:33   Mr Virgel Paling YQ Contrast  Result Date: 05/01/2019 CLINICAL DATA:  63 year old male code stroke presentation with right MCA bifurcation occlusion treated with endovascular reperfusion. EXAM: MRA HEAD WITHOUT CONTRAST TECHNIQUE: Angiographic images of the Circle of Willis were obtained using MRA technique without intravenous contrast. COMPARISON:  MRI performed at the same time. CTA head and neck and CT Perfusion 04/30/2019. FINDINGS: Study is mildly degraded by motion artifact despite repeated imaging attempts. Motion degraded distal vertebral arteries, but patent basilar artery with no definite stenosis. The basilar functionally terminates in the SCA is with fetal type bilateral PCA origins. Symmetric bilateral PCA flow signal. Motion degraded proximal ICA siphons. Both ICAs are patent with symmetric flow signal at the carotid termini. Patent MCA and ACA origins. Visible bilateral ACA branches are within normal limits. Symmetric flow signal in the bilateral MCA M1 segments and at the bifurcations. Bilateral MCA branch detail is degraded by motion. But right MCA branch patency appears improved from the earlier CTA. IMPRESSION: Motion degraded exam with improved right MCA patency from the pre-treatment CTA. Electronically Signed   By: Genevie Ann M.D.   On: 05/01/2019 03:43   Mr Brain Wo Contrast  Result Date: 05/01/2019 CLINICAL DATA:  63 year old male code stroke presentation with right MCA bifurcation occlusion treated with endovascular reperfusion. EXAM: MRI HEAD WITHOUT CONTRAST TECHNIQUE: Multiplanar, multiecho pulse sequences of the brain and surrounding structures were obtained without intravenous contrast. COMPARISON:  04/30/2019 CT head, CTA and CT Perfusion. FINDINGS: The examination had to be discontinued prior to completion due to patient agitation, and is intermittently motion degraded.  Coronal T2 and axial T1 weighted imaging was not obtained. Brain: Restricted diffusion at the right frontal operculum and involving a portion of the right insula, and some of the deep white matter capsule. This encompasses an area of about 31 x 36 x 60 millimeters (approximately 33 milliliters). FLAIR more so than T2 hyperintensity in the affected parenchyma. No associated hemorrhage is evident on motion degraded SWI. No mass effect. Possible small area of restricted diffusion also in the right inferior frontal gyrus on series 9, image 69. No restricted diffusion in any other vascular territory. Chronic anterior left MCA and right temporal lobe encephalomalacia. Mild ex vacuo ventricular enlargement. Negative brainstem and cerebellum. No midline shift, mass effect, evidence of mass lesion, extra-axial collection. Cervicomedullary junction and pituitary are within normal limits. Vascular: Major intracranial vascular flow voids are preserved. Skull and upper cervical spine: Negative visible cervical spine. Normal bone marrow signal. Sinuses/Orbits: Mild rightward gaze deviation. Otherwise negative orbits. Partially visible paranasal sinus mucosal thickening. Other: Mastoids are clear. IMPRESSION: 1. Truncated and intermittently motion degraded exam due to patient agitation. 2. Right MCA territory restricted diffusion primarily involving the operculum. No associated hemorrhage or mass effect. 3. Underlying bilateral MCA territory chronic encephalomalacia. Electronically Signed   By: Genevie Ann M.D.   On: 05/01/2019 03:39   Ct Code Stroke Cerebral Perfusion With Contrast  Result Date: 04/30/2019 CLINICAL DATA:  Stroke. Left-sided weakness. Atrial fibrillation off anticoagulation. EXAM: CT ANGIOGRAPHY HEAD AND NECK CT PERFUSION BRAIN TECHNIQUE: Multidetector CT imaging of the head and neck was performed using the standard protocol during bolus administration of  intravenous contrast. Multiplanar CT image reconstructions  and MIPs were obtained to evaluate the vascular anatomy. Carotid stenosis measurements (when applicable) are obtained utilizing NASCET criteria, using the distal internal carotid diameter as the denominator. Multiphase CT imaging of the brain was performed following IV bolus contrast injection. Subsequent parametric perfusion maps were calculated using RAPID software. CONTRAST:  16m OMNIPAQUE IOHEXOL 350 MG/ML SOLN COMPARISON:  CT head 04/30/2019 FINDINGS: CTA NECK FINDINGS Aortic arch: Suboptimal arterial opacification. Contrast is in the venous phase with limited arterial contrast. Atherosclerotic disease in the aorta. Proximal great vessels patent. Image interpretation delayed as patient was registered under the wrong name. Right carotid system: Mild atherosclerotic disease right carotid bifurcation without significant stenosis Left carotid system: Mild atherosclerotic disease left bifurcation without significant stenosis. Vertebral arteries: Both vertebral arteries are patent. Limited contrast opacification. Skeleton: No acute abnormality. Other neck: Negative for mass or adenopathy. Upper chest: Negative Review of the MIP images confirms the above findings CTA HEAD FINDINGS Anterior circulation: Mild atherosclerotic disease in the cavernous carotid bilaterally. Right MCA occlusion at the bifurcation. Chronic infarct right temporal lobe. Both anterior cerebral arteries patent. Left middle cerebral artery widely patent. Posterior circulation: Vertebral arteries not well visualized. Basilar artery not well visualized due to lack of adequate contrast. Fetal origin of the posterior cerebral arteries bilaterally Venous sinuses: No venous contrast present. Anatomic variants: None Review of the MIP images confirms the above findings CT Brain Perfusion Findings: ASPECTS: 10 CBF (<30%) Volume: 033mPerfusion (Tmax>6.0s) volume: 21962mismatch Volume: 02/01/2019mL Infarction Location:Right MCA territory involving the  right lateral temporal lobe and parietal lobe. IMPRESSION: Suboptimal CTA. Delayed interpretation due to registration under the wrong name. Very early arterial phase study with limited arterial opacification in the CTA. As best I can tell both carotid arteries are patent in the neck. There is occlusion of the right MCA at the bifurcation. CT perfusion markedly abnormal with large area of delayed perfusion right MCA territory. 219 mL of delayed perfusion. No core infarct. These results were called by telephone at the time of interpretation on 04/30/2019 at 12:30 pm to provider Aroor, who verbally acknowledged these results. Electronically Signed   By: ChaFranchot GalloD.   On: 04/30/2019 12:33   Dg Chest Port 1 View  Result Date: 04/30/2019 CLINICAL DATA:  Stroke EXAM: PORTABLE CHEST 1 VIEW COMPARISON:  08/25/2015 FINDINGS: Cardiomegaly. Both lungs are clear. The visualized skeletal structures are unremarkable. IMPRESSION: Cardiomegaly without acute abnormality of the lungs in AP portable projection. Electronically Signed   By: AleEddie CandleD.   On: 04/30/2019 16:07   Ct Head Code Stroke Wo Contrast  Result Date: 04/30/2019 CLINICAL DATA:  Code stroke.  Left-sided weakness. EXAM: CT HEAD WITHOUT CONTRAST TECHNIQUE: Contiguous axial images were obtained from the base of the skull through the vertex without intravenous contrast. COMPARISON:  None. FINDINGS: Brain: Chronic infarct right lateral temporal lobe. Chronic infarct left frontal lobe. Chronic infarct anterior limb internal capsule on the left. Negative for acute infarct. Negative for hemorrhage or mass. Mild ventricular enlargement most likely due to mild atrophy. Vascular: Hyperdense right MCA at the bifurcation. Probable acute thrombus. Skull: Negative Sinuses/Orbits: Cyst left maxillary sinus otherwise sinuses clear. Negative orbit. Other: None ASPECTS ([place] Headroke Program Early CT Score) - Ganglionic level infarction (caudate, lentiform  nuclei, internal capsule, insula, M1-M3 cortex): 7 - Supraganglionic infarction (M4-M6 cortex): 3 Total score (0-10 with 10 being normal): 10 IMPRESSION: 1. Probable hyperdense right MCA bifurcation.  No acute infarct. 2. Atrophy  with chronic right temporal infarct and chronic left frontal infarct. 3. ASPECTS is 10 4. Results texted to Dr. Lorraine Lax Electronically Signed   By: Franchot Gallo M.D.   On: 04/30/2019 11:54   Cerebral angiogram 05/01/2019 S/P complete revascularization of occluded RT MCA M1 seg with x 1 pass with SolitaireX 51m x 40 mm ret river ,and aspiration achieving a TICI 2c revascularization.  2D Echocardiogram  1. Left ventricular ejection fraction, by visual estimation, is 60 to 65%. The left ventricle has normal function. There is moderately increased left ventricular hypertrophy.  2. Left ventricular diastolic function could not be evaluated.  3. Global right ventricle has normal systolic function.The right ventricular size is normal. No increase in right ventricular wall thickness.  4. Left atrial size was severely dilated.  5. Right atrial size was mildly dilated.  6. Mild to moderate mitral annular calcification.  7. The mitral valve is grossly normal. No evidence of mitral valve regurgitation.  8. The tricuspid valve is grossly normal. Tricuspid valve regurgitation is trivial.  9. The aortic valve is tricuspid. Aortic valve regurgitation is mild. Mild aortic valve sclerosis without stenosis. 10. The pulmonic valve was not well visualized. Pulmonic valve regurgitation is not visualized.   PHYSICAL EXAM  Temp:  [97.5 F (36.4 C)-98.4 F (36.9 C)] 98.4 F (36.9 C) (11/16 0800) Pulse Rate:  [61-110] 87 (11/16 0900) Resp:  [12-24] 17 (11/16 0900) BP: (88-170)/(32-133) 128/65 (11/16 0900) SpO2:  [90 %-100 %] 95 % (11/16 0800) Arterial Line BP: (161-201)/(77-92) 197/86 (11/15 1730) Weight:  [157.4 kg] 157.4 kg (11/15 2200)  General - obese, well developed, mild  lethargy.  Ophthalmologic - fundi not visualized due to noncooperation.  Cardiovascular - irregularly irregular heart rate and rhythm.  Neuro - awake alert, anarthric, not able to answer orientation questions. Follows all simple commands, not able to name or repeat. PERRL, bilateral gaze full, no gaze palsy, blinking to visual threat bilaterally. Left facial droop. Tongue midline. RUE 5/5, LUE 3/5 proximal and 0/5 finger movement. RLE 3/5 proximal and 5/5 distal. LLE 3-/5 proximal and 4/5 distal. DTR 1+ and no babinski. Sensation symmetrical. Right FTN intact. Gait not tested.    ASSESSMENT/PLAN Mr. SAZAAN LEASKis a 63y.o. male with history of a. Fib ( not anticoagulated) sick sinus syndrom, HTN, CVA, OSA ( not on CPAP) presenting with right side gaze and left arm paralysis.   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 Probably hyperdense R MCA bifurcation. Old R temporal lobe and L frontal lobe infarcts. .     CTA head & neck suboptimal. Occlusion R MCA at bifurcation.  CT perfusion large delayed perfusion R MCA. No core infarct.   Cerebral angio occluded R M1, TICI2c   MRI  R MCA infarct (operculum). B MCA encephalomalacia  MRA  Improved R MCA patency  2D Echo EF 60-65%. No source of embolus   LDL 57   HgbA1c pending    HIV neg  Lovenox 40 mg sq daily for VTE prophylaxis  aspirin 81 mg daily prior to admission, now on aspirin 325 mg daily. Will consider DOAC in 3-5 days   Therapy recommendations:  pending   Disposition:  pending   Atrial Fibrillation  Home anticoagulation:  not takong   Started on Xarelto 09/15/2018, documented not taking 04/23/2019 (family practice note)  No on ASA PR . Resume DOAC in 3-5 days given moderate infarcts    Hypertension  Home meds:  Lisinopril 20 BP goal < 140 per IR x 24h following IR . On  cleviprex . BP stable . Put on PO BP meds once po access . Long-term BP goal normotensive  Hyperlipidemia  Home meds:  Prescribed lipitor - ?    LDL 57, goal < 70  Resume statin once po access  Continue statin at discharge  Dysphagia . Secondary to stroke . NPO . Speech on board . May need cortrak tomorrow if still failed swallow . On IVF @ 52  Tobacco abuse  Current smoker  Smoking cessation counseling provided  Pt is willing to quit   Other Stroke Risk Factors  ETOH use, alcohol level <10, advised to drink no more than 2 drink(s) a day  Substance abuse UDS:  Amphetamines  POSITIVE  Morbid Obesity, Body mass index is 43.37 kg/m., hx failed open gastroplasty in 1993, recommend weight loss, diet and exercise as appropriate   Hx stroke/TIA by imaging - Old R temporal lobe and L frontal lobe infarcts - likely due to AFib  Obstructive sleep apnea, not on CPAP at home  Other Active Problems  Leukocytosis WBC 9.2-> 11.3  COPD  Hospital day # 1  This patient is critically ill due to right MCA stroke due to right MCA occlusion s/p MT, hypertension, afib not on AC and at significant risk of neurological worsening, death form recurrent stroke, hemorrhagic conversion, heart failure, seizure. This patient's care requires constant monitoring of vital signs, hemodynamics, respiratory and cardiac monitoring, review of multiple databases, neurological assessment, discussion with family, other specialists and medical decision making of high complexity. I spent 40 minutes of neurocritical care time in the care of this patient.  [name], MD PhD Stroke Neurology 05/01/2019 10:04 AM   To contact Stroke Continuity provider, please refer to [email or web address] After hours, contact General Neurology

## 2019-05-01 NOTE — Progress Notes (Signed)
Inpatient Rehab Admissions:  Inpatient Rehab Consult received.  I met with patient at the bedside for rehabilitation assessment and to discuss goals and expectations of an inpatient rehab admission.  He is open to CIR program.  Indicated that he lives with his "mother." No contact information listed, but listed for his cousin, Kyle [name], whom he said I could contact to confirm dispo.  Note pt still requiring cleviprex drip (note BP while in room reading as 147/130).  Will continue to follow for timing of rehab admission pending insurance authorization, bed availability, and dispo confirmation.   Signed: Shann Medal, PT, DPT Admissions Coordinator [phone] 05/01/19  3:40 PM

## 2019-05-01 NOTE — Evaluation (Signed)
Occupational Therapy Evaluation Patient Details Name: Kyle [name] MRN: [id number] DOB: January 18, 1956 Today's Date: 05/01/2019    History of Present Illness Kyle [name] is an 63 y.o. male  With PMH a. Fib ( not anticoagulated) sick sinus syndrom, HTN, CVA, OSA ( not on CPAP) who presented to [facility] as a code stroke. Pt found to have R MCA acute ischemic stroke due to right M1 occlusion, pt s/p mechanical thrombectomy.   Clinical Impression   Pt admitted with above diagnoses presenting with: cognitive deficits, L sided sensory and strength deficits, and expressive communication difficulties. Information taken with yes/no questions. PTA pt living with family members and possibly using a cane. At time of eval, he presents with a R gaze reference, only able to track to midline. He is a max A +2 for bed mobility and +2 for sit <> stand for additional support of +2 on L side. While sitting EOB, facilitated LUE WB and assessed strength. Pt with trace movement in bicep, tricep, deltoid, and some finger movement. Appears to be more limited by proprioceptive/sensory issues. Given current status, recommend CIR at d/c for safe progression of BADL prior to d/c home. Will continue to follow per POC listed below.     Follow Up Recommendations  CIR;Supervision/Assistance - 24 hour    Equipment Recommendations  Other (comment)(TBD)    Recommendations for Other Services Rehab consult     Precautions / Restrictions Precautions Precautions: Fall Precaution Comments: strong L lateral lean Restrictions Weight Bearing Restrictions: No      Mobility Bed Mobility Overal bed mobility: Needs Assistance Bed Mobility: Supine to Sit     Supine to sit: Max assist;+2 for physical assistance     General bed mobility comments: HOB elevated; pt attempted to pull with RUE but having difficulty sequencing motor commands  Transfers Overall transfer level: Needs assistance Equipment used: 2 person hand held  assist(3 person assist with gait belt and bed pad) Transfers: Sit to/from Stand Sit to Stand: Max assist;+2 physical assistance(3rd person to support weak L side)         General transfer comment: pt with strong L lateral lean, PT blocking L knee and holding onto bed pad and tech assisting under L arm/flank to maintain upright/midline posture with OT on the R side with gait belt and bed pad, pt required blocking of L knee due to buckling, attempted to side step however unable due to buckling, maintain standing x 30 sec    Balance Overall balance assessment: Needs assistance Sitting-balance support: Bilateral upper extremity supported Sitting balance-Leahy Scale: Poor Sitting balance - Comments: posterior L lateral lean, worked on protective reactions with LUE   Standing balance support: Bilateral upper extremity supported Standing balance-Leahy Scale: Zero Standing balance comment: dependent on physical assist                           ADL either performed or assessed with clinical judgement   ADL Overall ADL's : Needs assistance/impaired Eating/Feeding: NPO Eating/Feeding Details (indicate cue type and reason): unable to purse lips, drooling Grooming: Set up;Sitting Grooming Details (indicate cue type and reason): with RUE to wipe face Upper Body Bathing: Moderate assistance;Sitting   Lower Body Bathing: Total assistance;Sitting/lateral leans;Sit to/from stand   Upper Body Dressing : Moderate assistance;Sitting   Lower Body Dressing: Total assistance;Sitting/lateral leans;Sit to/from stand   Toilet Transfer: +2 for physical assistance;+2 for safety/equipment;Squat-pivot;BSC;Total assistance   Toileting- Clothing Manipulation and Hygiene: Total assistance;Sitting/lateral lean  Functional mobility during ADLs: Maximal assistance;+2 for physical assistance;+2 for safety/equipment(+3 to support L side weakness) General ADL Comments: pt limited by communication,  cognition, and L sided weakness and sensory deficits     Vision   Vision Assessment?: Yes Ocular Range of Motion: Restricted on the left Alignment/Gaze Preference: Gaze right Tracking/Visual Pursuits: Decreased smoothness of horizontal tracking;Impaired - to be further tested in functional context;Requires cues, head turns, or add eye shifts to track;Other (comment)(difficulty tracking to L side of environment) Additional Comments: appears to present with sensory deficits on L side. Difficulty tracking past midline. Was able to write intelligble and legible words on white board in R side of environment     Perception     Praxis      Pertinent Vitals/Pain Pain Assessment: Faces Faces Pain Scale: Hurts little more Pain Location: hips with movement (noted skin breakdown in L hip crease where panus meets leg) Pain Intervention(s): Monitored during session;Repositioned     Hand Dominance Right   Extremity/Trunk Assessment Upper Extremity Assessment Upper Extremity Assessment: LUE deficits/detail LUE Deficits / Details: trace movement in tricep, bicep, deltoid, and fingers LUE Sensation: decreased proprioception LUE Coordination: decreased fine motor;decreased gross motor   Lower Extremity Assessment Lower Extremity Assessment: Defer to PT evaluation LLE Deficits / Details: grossly 3-/5, buckles in standing   Cervical / Trunk Assessment Cervical / Trunk Assessment: Normal   Communication Communication Communication: Expressive difficulties;Other (comment)(unable to verbalize)   Cognition Arousal/Alertness: Awake/alert Behavior During Therapy: Flat affect Overall Cognitive Status: Impaired/Different from baseline Area of Impairment: Safety/judgement;Following commands;Problem solving                       Following Commands: Follows one step commands with increased time;Follows one step commands inconsistently Safety/Judgement: Decreased awareness of safety;Decreased  awareness of deficits   Problem Solving: Slow processing;Decreased initiation;Requires verbal cues;Difficulty sequencing;Requires tactile cues General Comments: decreased level of alertness until position change. Able to write answers on white board. Unable to verbalize but appropriately answering yes/no with occasional repitition   General Comments  VSS, pt able to write approriate answers, pt with noted skin breakdown in rolls of pannus    Exercises     Shoulder Instructions      Home Living Family/patient expects to be discharged to:: Private residence Living Arrangements: Other relatives Available Help at Discharge: Family;Available 24 hours/day Type of Home: Mobile home Home Access: Stairs to enter Entrance Stairs-Number of Steps: 3 Entrance Stairs-Rails: Right Home Layout: One level     Bathroom Shower/Tub: Walk-in shower         Home Equipment: Environmental consultant - 2 wheels;Cane - single point   Additional Comments: taken through yes/no questions and previous sessions      Prior Functioning/Environment Level of Independence: Independent with assistive device(s)        Comments: uses cane        OT Problem List: Decreased strength;Impaired vision/perception;Decreased knowledge of use of DME or AE;Decreased range of motion;Decreased coordination;Decreased knowledge of precautions;Decreased activity tolerance;Decreased cognition;Impaired UE functional use;Impaired balance (sitting and/or standing);Decreased safety awareness;Impaired sensation      OT Treatment/Interventions: Self-care/ADL training;Visual/perceptual remediation/compensation;Therapeutic exercise;Patient/family education;Neuromuscular education;Balance training;Energy conservation;Therapeutic activities;DME and/or AE instruction;Cognitive remediation/compensation    OT Goals(Current goals can be found in the care plan section) Acute Rehab OT Goals OT Goal Formulation: Patient unable to participate in goal  setting Time For Goal Achievement: 05/15/19 Potential to Achieve Goals: Good  OT Frequency: Min 2X/week   Barriers to D/C:  Co-evaluation              AM-PAC OT "6 Clicks" Daily Activity     Outcome Measure Help from another person eating meals?: Total(NPO) Help from another person taking care of personal grooming?: A Little Help from another person toileting, which includes using toliet, bedpan, or urinal?: A Lot Help from another person bathing (including washing, rinsing, drying)?: A Lot Help from another person to put on and taking off regular upper body clothing?: A Lot Help from another person to put on and taking off regular lower body clothing?: Total 6 Click Score: 11   End of Session Equipment Utilized During Treatment: Gait belt Nurse Communication: Mobility status;Need for lift equipment  Activity Tolerance: Patient tolerated treatment well Patient left: in bed;with call bell/phone within reach;with bed alarm set  OT Visit Diagnosis: Unsteadiness on feet (R26.81);Other abnormalities of gait and mobility (R26.89);Hemiplegia and hemiparesis;Cognitive communication deficit (R41.841);Other symptoms and signs involving cognitive function Symptoms and signs involving cognitive functions: Cerebral infarction Hemiplegia - Right/Left: Left Hemiplegia - dominant/non-dominant: Non-Dominant Hemiplegia - caused by: Cerebral infarction                Time: [id number] OT Time Calculation (min): 43 min Charges:  OT General Charges $OT Visit: 1 Visit OT Evaluation $OT Eval Moderate Complexity: 1 Mod OT Treatments $Self Care/Home Management : 8-22 mins  Zenovia Jarred, MSOT, OTR/L Behavioral Health OT/ Acute Relief OT [facility] Office: [place] 05/01/2019, 1:22 PM

## 2019-05-01 NOTE — Progress Notes (Signed)
Rehab Admissions Coordinator Note:  Patient was screened by Michel Santee, PT, DPT for appropriateness for an Inpatient Acute Rehab Consult.  At this time, we are recommending Inpatient Rehab consult.  I will place a rehab consult order so that we can evaluate the patient for possible admission.   Michel Santee 05/01/2019, 2:34 PM  I can be reached at [phone] .

## 2019-05-01 NOTE — Evaluation (Signed)
Speech Language Pathology Evaluation Patient Details Name: Kyle [name] MRN: [id number] DOB: 1956/04/19 Today's Date: 05/01/2019 Time: [id number] SLP Time Calculation (min) (ACUTE ONLY): 13 min  Problem List:  Patient Active Problem List   Diagnosis Date Noted  . Stroke ([place]) 04/30/2019  . Middle cerebral artery embolism, right 04/30/2019  . Chronic atrial fibrillation ([place]) 08/26/2015  . Hyperlipidemia LDL goal <100 06/26/2014  . Routine general medical examination at a health care facility 11/21/2013  . Screening PSA (prostate specific antigen) 11/21/2013  . Special screening for malignant neoplasms, colon 11/21/2013  . OSA (obstructive sleep apnea) 08/22/2013  . Morbid obesity ([place]) 04/19/2013  . Other emphysema ([place]) 01/31/2013  . Essential hypertension 12/21/2012  . Insomnia 12/21/2012  . GERD (gastroesophageal reflux disease) 09/20/2012  . Back pain, chronic 09/20/2012  . Obesity hypoventilation syndrome ([place]) 09/20/2012  . Adjustment disorder with mixed anxiety and depressed mood 09/20/2012   Past Medical History:  Past Medical History:  Diagnosis Date  . Arthritis   . Atrial fibrillation    HX OF SICK SINUS SYNDROME-ATRIAL FIB  . Back pain, chronic    PT STATES 5 BULGING DISKS AND PINCHED NERVES--PT ON OXYCODONE 4 TIMES A DAY FOR HIS PAIN  . Chest pain 08/26/2015  . Chronic airway obstruction, not elsewhere classified 12/21/2012  . GERD (gastroesophageal reflux disease)   . Hypertension   . Impaired fasting glucose 04/19/2013  . Peripheral vascular disease ([facility])    CHRONIC VENOUS INSUFFICIENCY  . Shortness of breath   . 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 Stroke ([place])   . Wears dentures    Past Surgical History:  Past Surgical History:  Procedure Laterality Date  . ESOPHAGOGASTRODUODENOSCOPY  05/18/2011   Procedure: ESOPHAGOGASTRODUODENOSCOPY (EGD);  Surgeon: Shann Medal, MD;   Location: [facility];  Service: Endoscopy;  Laterality: N/A;  . FOOT SURGERY  2003   right foot surgery from work accident   . GASTRIC BYPASS  10/19/11  . GASTRIC RESTRICTION SURGERY  1992  . LEG SURGERY  1998   calcium deposit removed on right  leg   . RADIOLOGY WITH ANESTHESIA N/A 04/30/2019   Procedure: CODE STROKE;  Surgeon: Radiologist, Medication, MD;  Location: [facility];  Service: Radiology;  Laterality: N/A;   HPI:  Kyle [name] is a 63 y.o. M with hx of CVA, sleep apnea, SOB, peripheral vascular disease, HPT, GERD, gastric bypass surgery(2013), atrial fibrillation, arthritis, tobacco use, and ETOH admitted 11/15 after friend found sitting in car, confused. Head CT Showed hyperdense R MCA bifurcation, no acute infarct and atrophy with chronic temporal infarct / chronic L frontal infarct. MRI showed R MCA restricuted diffusion primarily involving operculum. No associated hemorrhage or mass efect. Underlying bilateral MCA chronic encephalomalacia. Briefly intubated 11/15 for arteriogram. Recently admitted to ED 11/06 for fall with R ankle sprain and 11/08 for another fall, NR reported pt is chronic opiate user due to chronic back pain. Diet currently NPO.    Assessment / Plan / Recommendation Clinical Impression  [name] demonstrates anarthria during attempts to produce speech. He can phonate with low intensity and with significant respiratory effort and can imitate pattern/rhythm but cannot move articulators to shape speech. Cranial nerve V and XII impaired with minimal movement to lateralize or elevate tongue. Comprehension intact for one step and two step commands. He is able to gesture to convey yes/no responses and wrote his thoughts/needs  with accuracy. From cognitive standpoint he needed cues to attend to therapist and bring head to left (midline). He needs intense therapy to facilitate oral-movement for speech and swallow.        SLP Assessment  SLP Recommendation/Assessment:  Patient needs continued Speech Lanaguage Pathology Services SLP Visit Diagnosis: Dysarthria and anarthria (R47.1);Cognitive communication deficit (R41.841)    Follow Up Recommendations  Inpatient Rehab    Frequency and Duration min 2x/week  2 weeks      SLP Evaluation Cognition  Overall Cognitive Status: Impaired/Different from baseline Arousal/Alertness: Awake/alert Orientation Level: Oriented to person;Oriented to place;Oriented to situation;Oriented to time Attention: Sustained Sustained Attention: Impaired Sustained Attention Impairment: Verbal basic;Functional basic Memory: (will assess further) Awareness: Appears intact Problem Solving: (will assess further) Safety/Judgment: Impaired       Comprehension  Auditory Comprehension Overall Auditory Comprehension: Appears within functional limits for tasks assessed Commands: Within Functional Limits Interfering Components: Attention Visual Recognition/Discrimination Discrimination: Not tested Reading Comprehension Reading Status: Not tested    Expression Expression Primary Mode of Expression: Nonverbal - gestures Verbal Expression Overall Verbal Expression: Impaired Initiation: Impaired Level of Generative/Spontaneous Verbalization: (phoneme) Repetition: Impaired Level of Impairment: (phoneme) Naming: Not tested Pragmatics: Impairment Impairments: Eye contact Interfering Components: Attention Non-Verbal Means of Communication: Gestures;Writing Written Expression Dominant Hand: Right Written Expression: Within Functional Limits   Oral / Motor  Oral Motor/Sensory Function Overall Oral Motor/Sensory Function: Severe impairment(impaired elevation and depression of jaw) Facial ROM: Other (Comment);Suspected CN VII (facial) dysfunction Facial Symmetry: Abnormal symmetry left;Suspected CN VII (facial) dysfunction Facial Strength: Reduced left;Suspected CN VII (facial) dysfunction Lingual ROM: Reduced left;Reduced  right;Suspected CN XII (hypoglossal) dysfunction Lingual Strength: Suspected CN XII (hypoglossal) dysfunction;Reduced Motor Speech Overall Motor Speech: Impaired Respiration: Impaired Level of Impairment: (phoneme) Phonation: Low vocal intensity Resonance: Hyponasality Articulation: Impaired Level of Impairment: (phoneme) Intelligibility: Intelligibility reduced Word: (unable to produce words) Motor Planning: Impaired Level of Impairment: (phoneme)   GO                    Houston Siren 05/01/2019, 4:03 PM  Orbie Pyo Colvin Caroli.Ed Risk analyst [phone] Office [phone]

## 2019-05-01 NOTE — Evaluation (Signed)
Physical Therapy Evaluation Patient Details Name: JOHNTHOMAS GEEN MRN: [id number] DOB: Jan 04, 1956 Today's Date: 05/01/2019   History of Present Illness  ARDIS VOTTO is an 63 y.o. male  With PMH a. Fib ( not anticoagulated) sick sinus syndrom, HTN, CVA, OSA ( not on CPAP) who presented to [facility] as a code stroke. Pt found to have R MCA acute ischemic stroke due to right M1 occlusion, pt s/p mechanical thrombectomy.  Clinical Impression  Pt admitted with above. Pt with decreased functional use of L UE, L LE weakness, R gaze preference, expressive aphasia and impaired proprioception. Pt was able to follow commands and write to communicate/answer questions. Pt requiring maxAx2 for all mobility at this time. Pt to benefit from CIR upon d/c for maximal functional recovery as pt was indep PTA.      Follow Up Recommendations CIR    Equipment Recommendations  (TBD at next venue)    Recommendations for Other Services       Precautions / Restrictions Precautions Precautions: Fall Precaution Comments: strong L lateral lean Restrictions Weight Bearing Restrictions: No      Mobility  Bed Mobility Overal bed mobility: Needs Assistance Bed Mobility: Supine to Sit     Supine to sit: Max assist;+2 for physical assistance     General bed mobility comments: HOB elevated, pt tried to pull with R UE however unable to lift body weight, pt resisted assist with R LE and kept is flexed until sat EOB  Transfers Overall transfer level: Needs assistance Equipment used: 2 person hand held assist(3 person assist with gait belt and bed pad) Transfers: Sit to/from Stand Sit to Stand: Max assist;+2 physical assistance(3rd person for trunk on L)         General transfer comment: pt with strong L lateral lean, PT blocking L knee and holding onto bed pad and tech assisting under L arm/flank to maintain upright/midline posture with OT on the R side with gait belt and bed pad, pt required blocking of L  knee due to buckling, attempted to side step however unable due to buckling, maintain standing x 30 sec  Ambulation/Gait                Stairs            Wheelchair Mobility    Modified Rankin (Stroke Patients Only) Modified Rankin (Stroke Patients Only) Pre-Morbid Rankin Score: No significant disability Modified Rankin: Severe disability     Balance Overall balance assessment: Needs assistance Sitting-balance support: Bilateral upper extremity supported Sitting balance-Leahy Scale: Poor Sitting balance - Comments: pt with strong posterior L lateral lean, pt attempting to use R UE to pull on bed rail, OT worked on L UE and used tactile cues to engage L UE muscles   Standing balance support: Bilateral upper extremity supported Standing balance-Leahy Scale: Zero Standing balance comment: dependent on physical assist                             Pertinent Vitals/Pain Pain Assessment: Faces Faces Pain Scale: No hurt    Home Living Family/patient expects to be discharged to:: Private residence Living Arrangements: Other relatives Available Help at Discharge: Family;Available 24 hours/day Type of Home: Mobile home Home Access: Stairs to enter Entrance Stairs-Rails: Right Entrance Stairs-Number of Steps: 3 Home Layout: One level Home Equipment: Walker - 2 wheels;Cane - single point      Prior Function Level of Independence: Independent with assistive  device(s)         Comments: uses cane     Hand Dominance   Dominant Hand: Right    Extremity/Trunk Assessment   Upper Extremity Assessment Upper Extremity Assessment: Defer to OT evaluation    Lower Extremity Assessment Lower Extremity Assessment: LLE deficits/detail LLE Deficits / Details: grossly 3-/5, buckles in standing    Cervical / Trunk Assessment Cervical / Trunk Assessment: Normal  Communication   Communication: Expressive difficulties  Cognition Arousal/Alertness:  Awake/alert Behavior During Therapy: Flat affect Overall Cognitive Status: Impaired/Different from baseline Area of Impairment: Safety/judgement;Following commands;Problem solving                       Following Commands: Follows one step commands with increased time;Follows one step commands inconsistently Safety/Judgement: Decreased awareness of safety;Decreased awareness of deficits   Problem Solving: Slow processing;Decreased initiation;Requires verbal cues;Difficulty sequencing;Requires tactile cues General Comments: pt able to write answers on white board, pt with episodes of falling asleep/closing of eyes, once EOB better      General Comments General comments (skin integrity, edema, etc.): VSS, pt able to write approriate answers, pt with noted skin breakdown in rolls of pannus    Exercises     Assessment/Plan    PT Assessment Patient needs continued PT services  PT Problem List Decreased strength;Decreased range of motion;Decreased activity tolerance;Decreased balance;Decreased mobility;Decreased coordination;Decreased cognition;Decreased knowledge of use of DME;Decreased safety awareness;Obesity       PT Treatment Interventions DME instruction;Gait training;Stair training;Functional mobility training;Therapeutic activities;Therapeutic exercise;Balance training;Neuromuscular re-education    PT Goals (Current goals can be found in the Care Plan section)  Acute Rehab PT Goals PT Goal Formulation: With patient Time For Goal Achievement: 05/15/19 Potential to Achieve Goals: Good    Frequency Min 4X/week   Barriers to discharge        Co-evaluation               AM-PAC PT "6 Clicks" Mobility  Outcome Measure Help needed turning from your back to your side while in a flat bed without using bedrails?: A Lot Help needed moving from lying on your back to sitting on the side of a flat bed without using bedrails?: A Lot Help needed moving to and from a bed  to a chair (including a wheelchair)?: Total Help needed standing up from a chair using your arms (e.g., wheelchair or bedside chair)?: Total Help needed to walk in hospital room?: Total Help needed climbing 3-5 steps with a railing? : Total 6 Click Score: 8    End of Session Equipment Utilized During Treatment: Gait belt Activity Tolerance: Patient tolerated treatment well Patient left: in bed;with call bell/phone within reach;with bed alarm set Nurse Communication: Mobility status PT Visit Diagnosis: Unsteadiness on feet (R26.81);Difficulty in walking, not elsewhere classified (R26.2);History of falling (Z91.81)    Time: [id number] PT Time Calculation (min) (ACUTE ONLY): 33 min   Charges:   PT Evaluation $PT Eval Moderate Complexity: 1 Mod          Kittie Plater, PT, DPT Acute Rehabilitation Services Pager #: [phone] Office #: [phone]   Berline Lopes 05/01/2019, 12:55 PM

## 2019-05-01 NOTE — Evaluation (Addendum)
Clinical/Bedside Swallow Evaluation Patient Details  Name: Kyle [name] MRN: :[id number] Date of Birth: 1955-07-20  Today's Date: 05/01/2019 Time: SLP Start Time (ACUTE ONLY): [id number] SLP Stop Time (ACUTE ONLY): 0952 SLP Time Calculation (min) (ACUTE ONLY): 10 min  Past Medical History:  Past Medical History:  Diagnosis Date  . Arthritis   . Atrial fibrillation    HX OF SICK SINUS SYNDROME-ATRIAL FIB  . Back pain, chronic    PT STATES 5 BULGING DISKS AND PINCHED NERVES--PT ON OXYCODONE 4 TIMES A DAY FOR HIS PAIN  . Chest pain 08/26/2015  . Chronic airway obstruction, not elsewhere classified 12/21/2012  . GERD (gastroesophageal reflux disease)   . Hypertension   . Impaired fasting glucose 04/19/2013  . Peripheral vascular disease (HCC)    CHRONIC VENOUS INSUFFICIENCY  . Shortness of breath   . 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 Stroke ([place])   . Wears dentures    Past Surgical History:  Past Surgical History:  Procedure Laterality Date  . ESOPHAGOGASTRODUODENOSCOPY  05/18/2011   Procedure: ESOPHAGOGASTRODUODENOSCOPY (EGD);  Surgeon: Shann Medal, MD;  Location: [facility];  Service: Endoscopy;  Laterality: N/A;  . FOOT SURGERY  2003   right foot surgery from work accident   . GASTRIC BYPASS  10/19/11  . GASTRIC RESTRICTION SURGERY  1992  . LEG SURGERY  1998   calcium deposit removed on right  leg   . RADIOLOGY WITH ANESTHESIA N/A 04/30/2019   Procedure: CODE STROKE;  Surgeon: Radiologist, Medication, MD;  Location: [place];  Service: Radiology;  Laterality: N/A;   HPI:  Kyle [name] is a 63 y.o. M with hx of CVA, sleep apnea, SOB, peripheral vascular disease, HPT, GERD, gastric bypass surgery(2013), atrial fibrillation, arthritis, tobacco use, and ETOH admitted 11/15 after friend found sitting in car, confused. Head CT Showed hyperdense R MCA bifurcation, no acute infarct and atrophy with chronic  temporal infarct / chronic L frontal infarct. MRI showed R MCA restricuted diffusion primarily involving operculum. No associated hemorrhage or mass efect. Underlying bilateral MCA chronic encephalomalacia. Briefly intubated 11/15 for arteriogram. Recently admitted to ED 11/06 for fall with R ankle sprain and 11/08 for another fall, NR reported pt is chronic opiate user due to chronic back pain. Diet currently NPO.    Assessment / Plan / Recommendation Clinical Impression  Pt exhibits severe oral dysphagia presently with CN V and XII impairments. His mandible is in a depressed position and he is unable to mobilize/jaw or approximate lips. Ice falls from oral cavity unable to achieve cohesion and pudding remains on anterior tongue with eventual suction. Delayed coughs during oral care indicative of possible penetration of liquid mouth rinse. One reflexive swallow during assessment. Continue NPO status and ST will intervene focusing on lingual/mandibular control/cohesion and manipulation.        SLP Visit Diagnosis: Dysphagia, oral phase (R13.11)    Aspiration Risk  Severe aspiration risk    Diet Recommendation NPO   Medication Administration: Via alternative means    Other  Recommendations Oral Care Recommendations: Oral care QID   Follow up Recommendations Inpatient Rehab      Frequency and Duration min 2x/week  2 weeks       Prognosis Prognosis for Safe Diet Advancement: Good Barriers to Reach Goals: Severity of deficits      Swallow Study   General HPI: Kyle [name] is a 64 y.o.  M with hx of CVA, sleep apnea, SOB, peripheral vascular disease, HPT, GERD, gastric bypass surgery(2013), atrial fibrillation, arthritis, tobacco use, and ETOH admitted 11/15 after friend found sitting in car, confused. Head CT Showed hyperdense R MCA bifurcation, no acute infarct and atrophy with chronic temporal infarct / chronic L frontal infarct. MRI showed R MCA restricuted diffusion primarily involving  operculum. No associated hemorrhage or mass efect. Underlying bilateral MCA chronic encephalomalacia. Briefly intubated 11/15 for arteriogram. Recently admitted to ED 11/06 for fall with R ankle sprain and 11/08 for another fall, NR reported pt is chronic opiate user due to chronic back pain. Diet currently NPO.  Type of Study: Bedside Swallow Evaluation Previous Swallow Assessment: (none) Diet Prior to this Study: NPO Temperature Spikes Noted: No Respiratory Status: Nasal cannula History of Recent Intubation: Yes Length of Intubations (days): (brief during procedure) Date extubated: 04/30/19 Behavior/Cognition: Alert;Cooperative Oral Cavity Assessment: Dry Oral Care Completed by SLP: Yes Oral Cavity - Dentition: Edentulous Vision: Functional for self-feeding Self-Feeding Abilities: Needs assist Patient Positioning: Upright in bed Baseline Vocal Quality: Low vocal intensity    Oral/Motor/Sensory Function Overall Oral Motor/Sensory Function: Other (comment)(impaired elevation and depression mandible) Facial ROM: Other (Comment);Suspected CN VII (facial) dysfunction(bilateral) Facial Symmetry: Abnormal symmetry left;Suspected CN VII (facial) dysfunction Facial Strength: Reduced left;Suspected CN VII (facial) dysfunction Lingual ROM: Reduced left;Reduced right;Suspected CN XII (hypoglossal) dysfunction Lingual Strength: Suspected CN XII (hypoglossal) dysfunction;Reduced   Ice Chips Ice chips: Impaired Presentation: Spoon Oral Phase Impairments: Reduced labial seal Oral Phase Functional Implications: Oral holding   Thin Liquid Thin Liquid: Impaired Presentation: Cup;Straw Oral Phase Impairments: Reduced labial seal Oral Phase Functional Implications: Other (comment)(spilled from oral cavity)    Nectar Thick Nectar Thick Liquid: Not tested   Honey Thick Honey Thick Liquid: Not tested   Puree Puree: Impaired Oral Phase Impairments: Reduced labial seal Oral Phase Functional  Implications: Oral holding   Solid     Solid: Not tested      Houston Siren 05/01/2019,2:44 PM   Orbie Pyo Colvin Caroli.Ed Risk analyst [phone] Office [phone]

## 2019-05-01 NOTE — Consult Note (Signed)
Physical Medicine and Rehabilitation Consult Reason for Consult: Left side weakness Referring Physician: [facility]   HPI: Kyle [name] is a 63 y.o. right-handed male history of atrial fibrillation on Xarelto but noncompliant with sick sinus syndrome maintained on aspirin, hypertension, CVA, OSA not on CPAP, tobacco abuse, gastric bypass.  Per chart review patient lives with family.  Independent with assistive device.  Mobile home with 3 steps to entry.  Family assistance as needed.  Presented 04/30/2019 with left-sided weakness and right gaze preference and aphasia.  Cranial CT scan showed probable hyperdense right MCA bifurcation.  No acute infarct.  Atrophy with chronic right temporal infarct and chronic left frontal infarct.  CT angiogram of head and neck showed CT perfusion markedly abnormal with large area of delayed perfusion right MCA territory as well as occlusion right MCA bifurcation.  Patient did not receive TPA.  Echocardiogram with ejection fraction of 65% left ventricle normal function.  Underwent right common carotid arteriogram revascularization of occluded right MCA segment per interventional radiology.  Neurology consulted presently on aspirin for CVA prophylaxis.  Lovenox for DVT prophylaxis.  Cleviprex ongoing for blood pressure control.  Currently n.p.o. with follow-up speech therapy.  Therapy evaluation completed and recommendations of physical medicine rehab consult.   Review of Systems  Constitutional: Negative for chills and fever.  HENT: Negative for hearing loss.   Eyes: Negative for blurred vision and double vision.  Respiratory: Positive for shortness of breath. Negative for cough.   Cardiovascular: Positive for leg swelling. Negative for chest pain and palpitations.  Gastrointestinal: Positive for constipation. Negative for heartburn, nausea and vomiting.       GERD  Genitourinary: Negative for dysuria and hematuria.  Musculoskeletal: Positive for joint pain  and myalgias.  Skin: Negative for rash.  Neurological: Positive for weakness.  All other systems reviewed and are negative.  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 AND DOES NOT HAVE MASK OR MACHINE AT HOME   Stroke [facility])    Wears dentures    Past Surgical History:  Procedure Laterality Date   ESOPHAGOGASTRODUODENOSCOPY  05/18/2011   Procedure: ESOPHAGOGASTRODUODENOSCOPY (EGD);  Surgeon: Shann Medal, MD;  Location: [facility];  Service: Endoscopy;  Laterality: N/A;   FOOT SURGERY  2003   right foot surgery from work accident    [place]  10/19/11   [place]   calcium deposit removed on right  leg    RADIOLOGY WITH ANESTHESIA N/A 04/30/2019   Procedure: CODE STROKE;  Surgeon: Radiologist, Medication, MD;  Location: [place];  Service: Radiology;  Laterality: N/A;   Family History  Problem Relation Age of Onset   Cancer Mother        melanoma   Heart disease Mother    Rheum arthritis Mother    Lung cancer Father  melanoma   Heart disease Father    Heart disease Sister    Social History:  reports that he has been smoking cigarettes. He has a 20.00 pack-year smoking history. He has never used smokeless tobacco. He reports current alcohol use. He reports that he does not use drugs. Allergies:  Allergies  Allergen Reactions   Vioxx [Rofecoxib] Rash   Medications  Prior to Admission  Medication Sig Dispense Refill   albuterol (PROVENTIL HFA;VENTOLIN HFA) 108 (90 Base) MCG/ACT inhaler Inhale 2 puffs into the lungs every 4 (four) hours as needed for wheezing or shortness of breath. 1 Inhaler 5   amitriptyline (ELAVIL) 50 MG tablet TAKE 1 TABLET BY MOUTH EVERYDAY AT BEDTIME (Patient taking differently: Take 50 mg by mouth at bedtime. ) 30 tablet 0   aspirin EC 81 MG tablet Take 81 mg by mouth daily.     atorvastatin (LIPITOR) 20 MG tablet Take 1 tablet (20 mg total) by mouth daily. For cholesterol. (Patient not taking: Reported on 04/30/2019) 90 tablet 3   atorvastatin (LIPITOR) 40 MG tablet Take 40 mg by mouth daily.     gabapentin (NEURONTIN) 600 MG tablet TAKE 1 TABLET BY MOUTH THREE TIMES A DAY (Patient taking differently: Take 600 mg by mouth 4 (four) times daily. ) 90 tablet 0   lisinopril (PRINIVIL,ZESTRIL) 20 MG tablet Take 1 tablet (20 mg total) by mouth daily. For blood pressure. (Patient not taking: Reported on 04/30/2019) 90 tablet 0   lisinopril (ZESTRIL) 10 MG tablet Take 10 mg by mouth daily.     metoprolol succinate (TOPROL-XL) 100 MG 24 hr tablet Take 100 mg by mouth daily.     nystatin cream (MYCOSTATIN)      oxyCODONE (OXY IR/ROXICODONE) 5 MG immediate release tablet Take 5 mg by mouth 4 (four) times daily as needed (pain).      pantoprazole (PROTONIX) 40 MG tablet Take 40 mg by mouth daily.     PARoxetine (PAXIL) 10 MG tablet Take 10 mg by mouth daily.      Home: Home Living Family/patient expects to be discharged to:: Private residence Living Arrangements: Other relatives Available Help at Discharge: Family, Available 24 hours/day Type of Home: Mobile home Home Access: Stairs to enter Entrance Stairs-Number of Steps: 3 Entrance Stairs-Rails: Right Home Layout: One level Bathroom Shower/Tub: [place]: Environmental consultant - 2 wheels, [employer] - single point Additional Comments: taken through yes/no questions and  previous sessions  Functional History: Prior Function Level of Independence: Independent with assistive device(s) Comments: uses cane Functional Status:  Mobility: Bed Mobility Overal bed mobility: Needs Assistance Bed Mobility: Supine to Sit Supine to sit: Max assist, +2 for physical assistance General bed mobility comments: HOB elevated; pt attempted to pull with RUE but having difficulty sequencing motor commands Transfers Overall transfer level: Needs assistance Equipment used: 2 person hand held assist(3 person assist with gait belt and bed pad) Transfers: Sit to/from Stand Sit to Stand: Max assist, +2 physical assistance(3rd person to support weak L side) General transfer comment: pt with strong L lateral lean, PT blocking L knee and holding onto bed pad and tech assisting under L arm/flank to maintain upright/midline posture with OT on the R side with gait belt and bed pad, pt required blocking of L knee due to buckling, attempted to side step however unable due to buckling, maintain standing x 30 sec      ADL: ADL Overall ADL's : Needs assistance/impaired Eating/Feeding: NPO Eating/Feeding Details (indicate cue type and  reason): unable to purse lips, drooling Grooming: Set up, Sitting Grooming Details (indicate cue type and reason): with RUE to wipe face Upper Body Bathing: Moderate assistance, Sitting Lower Body Bathing: Total assistance, Sitting/lateral leans, Sit to/from stand Upper Body Dressing : Moderate assistance, Sitting Lower Body Dressing: Total assistance, Sitting/lateral leans, Sit to/from stand Toilet Transfer: +2 for physical assistance, +2 for safety/equipment, Squat-pivot, BSC, Total assistance Toileting- Clothing Manipulation and Hygiene: Total assistance, Sitting/lateral lean Functional mobility during ADLs: Maximal assistance, +2 for physical assistance, +2 for safety/equipment(+3 to support L side weakness) General ADL Comments: pt limited by  communication, cognition, and L sided weakness and sensory deficits  Cognition: Cognition Overall Cognitive Status: Impaired/Different from baseline Orientation Level: Other (comment)(UTA, incomprehensible) Cognition Arousal/Alertness: Awake/alert Behavior During Therapy: Flat affect Overall Cognitive Status: Impaired/Different from baseline Area of Impairment: Safety/judgement, Following commands, Problem solving Following Commands: Follows one step commands with increased time, Follows one step commands inconsistently Safety/Judgement: Decreased awareness of safety, Decreased awareness of deficits Problem Solving: Slow processing, Decreased initiation, Requires verbal cues, Difficulty sequencing, Requires tactile cues General Comments: decreased level of alertness until position change. Able to write answers on white board. Unable to verbalize but appropriately answering yes/no with occasional repitition  Blood pressure (!) 151/62, pulse 85, temperature 98.4 F (36.9 C), temperature source Oral, resp. rate 16, height 6\' 3"  (1.905 m), weight (!) 157.4 kg, SpO2 95 %. Physical Exam  Neurological:  Alert.  Patient not able to answer orientation questions.He  does follow some simple commands.   General: No acute distress Mood and affect are appropriate Heart: Regular rate and rhythm no rubs murmurs or extra sounds Lungs: Clear to auscultation, breathing unlabored, no rales or wheezes Abdomen: Positive bowel sounds, soft nontender to palpation, nondistended Extremities: Stasis dermatitis as well as varicose veins bilateral legs Skin: No evidence of breakdown, no evidence of rash Neurologic: , motor strength is 5/5 in right deltoid, bicep, tricep, grip, 3 - right hip flexor, 4 knee extensors, 4 ankle dorsiflexor and plantar flexor 3 - left deltoid, 2 - bicep tricep, 2 - finger flexors and extensors 3 - left hip flexor knee extensor ankle dorsiflexor plantar flexor Sensory exam normal  sensation to light touch and proprioception in bilateral upper and lower extremities Cerebellar exam normal finger to nose to finger on the right, unable to perform on the left secondary to weakness musculoskeletal: Pain and decreased right hip internal and external rotation No pain with shoulder range of motion bilaterally no pain with left hip range of motion.  Mildly limited left hip internal rotation  Results for orders placed or performed during the hospital encounter of 04/30/19 (from the past 24 hour(s))  MRSA PCR Screening     Status: None   Collection Time: 04/30/19  3:00 PM   Specimen: Nasal Mucosa; Nasopharyngeal  Result Value Ref Range   MRSA by PCR NEGATIVE NEGATIVE  Urine rapid drug screen (hosp performed)     Status: Abnormal   Collection Time: 04/30/19  3:07 PM  Result Value Ref Range   Opiates NONE DETECTED NONE DETECTED   Cocaine NONE DETECTED NONE DETECTED   Benzodiazepines NONE DETECTED NONE DETECTED   Amphetamines POSITIVE (A) NONE DETECTED   Tetrahydrocannabinol NONE DETECTED NONE DETECTED   Barbiturates NONE DETECTED NONE DETECTED  Urinalysis, Routine w reflex microscopic     Status: Abnormal   Collection Time: 04/30/19  3:07 PM  Result Value Ref Range   Color, Urine YELLOW YELLOW   APPearance HAZY (A) CLEAR  Specific Gravity, Urine >1.046 (H) 1.005 - 1.030   pH 6.0 5.0 - 8.0   Glucose, UA NEGATIVE NEGATIVE mg/dL   Hgb urine dipstick NEGATIVE NEGATIVE   Bilirubin Urine NEGATIVE NEGATIVE   Ketones, ur NEGATIVE NEGATIVE mg/dL   Protein, ur NEGATIVE NEGATIVE mg/dL   Nitrite NEGATIVE NEGATIVE   Leukocytes,Ua NEGATIVE NEGATIVE  HIV Antibody (routine testing w rflx)     Status: None   Collection Time: 04/30/19  3:07 PM  Result Value Ref Range   HIV Screen 4th Generation wRfx NON REACTIVE NON REACTIVE  Glucose, capillary     Status: Abnormal   Collection Time: 04/30/19  7:41 PM  Result Value Ref Range   Glucose-Capillary 108 (H) 70 - 99 mg/dL   Comment 1  Notify RN    Comment 2 Document in Chart   Glucose, capillary     Status: Abnormal   Collection Time: 04/30/19 11:36 PM  Result Value Ref Range   Glucose-Capillary 110 (H) 70 - 99 mg/dL   Comment 1 Notify RN    Comment 2 Document in Chart   Glucose, capillary     Status: Abnormal   Collection Time: 05/01/19  3:43 AM  Result Value Ref Range   Glucose-Capillary 113 (H) 70 - 99 mg/dL   Comment 1 Notify RN    Comment 2 Document in Chart   Hemoglobin A1c     Status: None   Collection Time: 05/01/19  6:30 AM  Result Value Ref Range   Hgb A1c MFr Bld 5.5 4.8 - 5.6 %   Mean Plasma Glucose 111.15 mg/dL  Lipid panel     Status: Abnormal   Collection Time: 05/01/19  6:30 AM  Result Value Ref Range   Cholesterol 102 0 - 200 mg/dL   Triglycerides 98 <150 mg/dL   HDL 25 (L) >40 mg/dL   Total CHOL/HDL Ratio 4.1 RATIO   VLDL 20 0 - 40 mg/dL   LDL Cholesterol 57 0 - 99 mg/dL  CBC with Differential/Platelet     Status: Abnormal   Collection Time: 05/01/19  6:30 AM  Result Value Ref Range   WBC 11.3 (H) 4.0 - 10.5 K/uL   RBC 5.52 4.22 - 5.81 MIL/uL   Hemoglobin 15.4 13.0 - 17.0 g/dL   HCT 46.5 39.0 - 52.0 %   MCV 84.2 80.0 - 100.0 fL   MCH 27.9 26.0 - 34.0 pg   MCHC 33.1 30.0 - 36.0 g/dL   RDW 15.2 11.5 - 15.5 %   Platelets 157 150 - 400 K/uL   nRBC 0.0 0.0 - 0.2 %   Neutrophils Relative % 81 %   Neutro Abs 9.2 (H) 1.7 - 7.7 K/uL   Lymphocytes Relative 10 %   Lymphs Abs 1.1 0.7 - 4.0 K/uL   Monocytes Relative 8 %   Monocytes Absolute 0.9 0.1 - 1.0 K/uL   Eosinophils Relative 1 %   Eosinophils Absolute 0.1 0.0 - 0.5 K/uL   Basophils Relative 0 %   Basophils Absolute 0.0 0.0 - 0.1 K/uL   Immature Granulocytes 0 %   Abs Immature Granulocytes 0.03 0.00 - 0.07 K/uL  Basic metabolic panel     Status: Abnormal   Collection Time: 05/01/19  6:30 AM  Result Value Ref Range   Sodium 136 135 - 145 mmol/L   Potassium 4.2 3.5 - 5.1 mmol/L   Chloride 101 98 - 111 mmol/L   CO2 19 (L) 22 - 32  mmol/L   Glucose, Bld 113 (  H) 70 - 99 mg/dL   BUN 12 8 - 23 mg/dL   Creatinine, Ser 0.73 0.61 - 1.24 mg/dL   Calcium 8.8 (L) 8.9 - 10.3 mg/dL   GFR calc non Af Amer >60 >60 mL/min   GFR calc Af Amer >60 >60 mL/min   Anion gap 16 (H) 5 - 15  Glucose, capillary     Status: Abnormal   Collection Time: 05/01/19  7:37 AM  Result Value Ref Range   Glucose-Capillary 108 (H) 70 - 99 mg/dL   Comment 1 Notify RN    Comment 2 Document in Chart    Ct Code Stroke Cta Head W/wo Contrast  Result Date: 04/30/2019 CLINICAL DATA:  Stroke. Left-sided weakness. Atrial fibrillation off anticoagulation. EXAM: CT ANGIOGRAPHY HEAD AND NECK CT PERFUSION BRAIN TECHNIQUE: Multidetector CT imaging of the head and neck was performed using the standard protocol during bolus administration of intravenous contrast. Multiplanar CT image reconstructions and MIPs were obtained to evaluate the vascular anatomy. Carotid stenosis measurements (when applicable) are obtained utilizing NASCET criteria, using the distal internal carotid diameter as the denominator. Multiphase CT imaging of the brain was performed following IV bolus contrast injection. Subsequent parametric perfusion maps were calculated using RAPID software. CONTRAST:  185mL OMNIPAQUE IOHEXOL 350 MG/ML SOLN COMPARISON:  CT head 04/30/2019 FINDINGS: CTA NECK FINDINGS Aortic arch: Suboptimal arterial opacification. Contrast is in the venous phase with limited arterial contrast. Atherosclerotic disease in the aorta. Proximal great vessels patent. Image interpretation delayed as patient was registered under the wrong name. Right carotid system: Mild atherosclerotic disease right carotid bifurcation without significant stenosis Left carotid system: Mild atherosclerotic disease left bifurcation without significant stenosis. Vertebral arteries: Both vertebral arteries are patent. Limited contrast opacification. Skeleton: No acute abnormality. Other neck: Negative for mass or  adenopathy. Upper chest: Negative Review of the MIP images confirms the above findings CTA HEAD FINDINGS Anterior circulation: Mild atherosclerotic disease in the cavernous carotid bilaterally. Right MCA occlusion at the bifurcation. Chronic infarct right temporal lobe. Both anterior cerebral arteries patent. Left middle cerebral artery widely patent. Posterior circulation: Vertebral arteries not well visualized. Basilar artery not well visualized due to lack of adequate contrast. Fetal origin of the posterior cerebral arteries bilaterally Venous sinuses: No venous contrast present. Anatomic variants: None Review of the MIP images confirms the above findings CT Brain Perfusion Findings: ASPECTS: 10 CBF (<30%) Volume: 37mL Perfusion (Tmax>6.0s) volume: 214mL Mismatch Volume: 02/01/2019mL Infarction Location:Right MCA territory involving the right lateral temporal lobe and parietal lobe. IMPRESSION: Suboptimal CTA. Delayed interpretation due to registration under the wrong name. Very early arterial phase study with limited arterial opacification in the CTA. As best I can tell both carotid arteries are patent in the neck. There is occlusion of the right MCA at the bifurcation. CT perfusion markedly abnormal with large area of delayed perfusion right MCA territory. 219 mL of delayed perfusion. No core infarct. These results were called by telephone at the time of interpretation on 04/30/2019 at 12:30 pm to provider Aroor, who verbally acknowledged these results. Electronically Signed   By: Franchot Gallo M.D.   On: 04/30/2019 12:33   Ct Code Stroke Cta Neck W/wo Contrast  Result Date: 04/30/2019 CLINICAL DATA:  Stroke. Left-sided weakness. Atrial fibrillation off anticoagulation. EXAM: CT ANGIOGRAPHY HEAD AND NECK CT PERFUSION BRAIN TECHNIQUE: Multidetector CT imaging of the head and neck was performed using the standard protocol during bolus administration of intravenous contrast. Multiplanar CT image  reconstructions and MIPs were obtained to evaluate  the vascular anatomy. Carotid stenosis measurements (when applicable) are obtained utilizing NASCET criteria, using the distal internal carotid diameter as the denominator. Multiphase CT imaging of the brain was performed following IV bolus contrast injection. Subsequent parametric perfusion maps were calculated using RAPID software. CONTRAST:  179mL OMNIPAQUE IOHEXOL 350 MG/ML SOLN COMPARISON:  CT head 04/30/2019 FINDINGS: CTA NECK FINDINGS Aortic arch: Suboptimal arterial opacification. Contrast is in the venous phase with limited arterial contrast. Atherosclerotic disease in the aorta. Proximal great vessels patent. Image interpretation delayed as patient was registered under the wrong name. Right carotid system: Mild atherosclerotic disease right carotid bifurcation without significant stenosis Left carotid system: Mild atherosclerotic disease left bifurcation without significant stenosis. Vertebral arteries: Both vertebral arteries are patent. Limited contrast opacification. Skeleton: No acute abnormality. Other neck: Negative for mass or adenopathy. Upper chest: Negative Review of the MIP images confirms the above findings CTA HEAD FINDINGS Anterior circulation: Mild atherosclerotic disease in the cavernous carotid bilaterally. Right MCA occlusion at the bifurcation. Chronic infarct right temporal lobe. Both anterior cerebral arteries patent. Left middle cerebral artery widely patent. Posterior circulation: Vertebral arteries not well visualized. Basilar artery not well visualized due to lack of adequate contrast. Fetal origin of the posterior cerebral arteries bilaterally Venous sinuses: No venous contrast present. Anatomic variants: None Review of the MIP images confirms the above findings CT Brain Perfusion Findings: ASPECTS: 10 CBF (<30%) Volume: 80mL Perfusion (Tmax>6.0s) volume: 259mL Mismatch Volume: 02/01/2019mL Infarction Location:Right MCA territory  involving the right lateral temporal lobe and parietal lobe. IMPRESSION: Suboptimal CTA. Delayed interpretation due to registration under the wrong name. Very early arterial phase study with limited arterial opacification in the CTA. As best I can tell both carotid arteries are patent in the neck. There is occlusion of the right MCA at the bifurcation. CT perfusion markedly abnormal with large area of delayed perfusion right MCA territory. 219 mL of delayed perfusion. No core infarct. These results were called by telephone at the time of interpretation on 04/30/2019 at 12:30 pm to provider Aroor, who verbally acknowledged these results. Electronically Signed   By: Franchot Gallo M.D.   On: 04/30/2019 12:33   Mr Virgel Paling [id number] Contrast  Result Date: 05/01/2019 CLINICAL DATA:  63 year old male code stroke presentation with right MCA bifurcation occlusion treated with endovascular reperfusion. EXAM: MRA HEAD WITHOUT CONTRAST TECHNIQUE: Angiographic images of the Circle of Willis were obtained using MRA technique without intravenous contrast. COMPARISON:  MRI performed at the same time. CTA head and neck and CT Perfusion 04/30/2019. FINDINGS: Study is mildly degraded by motion artifact despite repeated imaging attempts. Motion degraded distal vertebral arteries, but patent basilar artery with no definite stenosis. The basilar functionally terminates in the SCA is with fetal type bilateral PCA origins. Symmetric bilateral PCA flow signal. Motion degraded proximal ICA siphons. Both ICAs are patent with symmetric flow signal at the carotid termini. Patent MCA and ACA origins. Visible bilateral ACA branches are within normal limits. Symmetric flow signal in the bilateral MCA M1 segments and at the bifurcations. Bilateral MCA branch detail is degraded by motion. But right MCA branch patency appears improved from the earlier CTA. IMPRESSION: Motion degraded exam with improved right MCA patency from the pre-treatment CTA.  Electronically Signed   By: Genevie Ann M.D.   On: 05/01/2019 03:43   Mr Brain Wo Contrast  Result Date: 05/01/2019 CLINICAL DATA:  63 year old male code stroke presentation with right MCA bifurcation occlusion treated with endovascular reperfusion. EXAM: MRI HEAD WITHOUT CONTRAST TECHNIQUE:  Multiplanar, multiecho pulse sequences of the brain and surrounding structures were obtained without intravenous contrast. COMPARISON:  04/30/2019 CT head, CTA and CT Perfusion. FINDINGS: The examination had to be discontinued prior to completion due to patient agitation, and is intermittently motion degraded. Coronal T2 and axial T1 weighted imaging was not obtained. Brain: Restricted diffusion at the right frontal operculum and involving a portion of the right insula, and some of the deep white matter capsule. This encompasses an area of about 31 x 36 x 60 millimeters (approximately 33 milliliters). FLAIR more so than T2 hyperintensity in the affected parenchyma. No associated hemorrhage is evident on motion degraded SWI. No mass effect. Possible small area of restricted diffusion also in the right inferior frontal gyrus on series 9, image 69. No restricted diffusion in any other vascular territory. Chronic anterior left MCA and right temporal lobe encephalomalacia. Mild ex vacuo ventricular enlargement. Negative brainstem and cerebellum. No midline shift, mass effect, evidence of mass lesion, extra-axial collection. Cervicomedullary junction and pituitary are within normal limits. Vascular: Major intracranial vascular flow voids are preserved. Skull and upper cervical spine: Negative visible cervical spine. Normal bone marrow signal. Sinuses/Orbits: Mild rightward gaze deviation. Otherwise negative orbits. Partially visible paranasal sinus mucosal thickening. Other: Mastoids are clear. IMPRESSION: 1. Truncated and intermittently motion degraded exam due to patient agitation. 2. Right MCA territory restricted diffusion  primarily involving the operculum. No associated hemorrhage or mass effect. 3. Underlying bilateral MCA territory chronic encephalomalacia. Electronically Signed   By: Genevie Ann M.D.   On: 05/01/2019 03:39   Ct Code Stroke Cerebral Perfusion With Contrast  Result Date: 04/30/2019 CLINICAL DATA:  Stroke. Left-sided weakness. Atrial fibrillation off anticoagulation. EXAM: CT ANGIOGRAPHY HEAD AND NECK CT PERFUSION BRAIN TECHNIQUE: Multidetector CT imaging of the head and neck was performed using the standard protocol during bolus administration of intravenous contrast. Multiplanar CT image reconstructions and MIPs were obtained to evaluate the vascular anatomy. Carotid stenosis measurements (when applicable) are obtained utilizing NASCET criteria, using the distal internal carotid diameter as the denominator. Multiphase CT imaging of the brain was performed following IV bolus contrast injection. Subsequent parametric perfusion maps were calculated using RAPID software. CONTRAST:  12mL OMNIPAQUE IOHEXOL 350 MG/ML SOLN COMPARISON:  CT head 04/30/2019 FINDINGS: CTA NECK FINDINGS Aortic arch: Suboptimal arterial opacification. Contrast is in the venous phase with limited arterial contrast. Atherosclerotic disease in the aorta. Proximal great vessels patent. Image interpretation delayed as patient was registered under the wrong name. Right carotid system: Mild atherosclerotic disease right carotid bifurcation without significant stenosis Left carotid system: Mild atherosclerotic disease left bifurcation without significant stenosis. Vertebral arteries: Both vertebral arteries are patent. Limited contrast opacification. Skeleton: No acute abnormality. Other neck: Negative for mass or adenopathy. Upper chest: Negative Review of the MIP images confirms the above findings CTA HEAD FINDINGS Anterior circulation: Mild atherosclerotic disease in the cavernous carotid bilaterally. Right MCA occlusion at the bifurcation. Chronic  infarct right temporal lobe. Both anterior cerebral arteries patent. Left middle cerebral artery widely patent. Posterior circulation: Vertebral arteries not well visualized. Basilar artery not well visualized due to lack of adequate contrast. Fetal origin of the posterior cerebral arteries bilaterally Venous sinuses: No venous contrast present. Anatomic variants: None Review of the MIP images confirms the above findings CT Brain Perfusion Findings: ASPECTS: 10 CBF (<30%) Volume: 62mL Perfusion (Tmax>6.0s) volume: 268mL Mismatch Volume: 02/01/2019mL Infarction Location:Right MCA territory involving the right lateral temporal lobe and parietal lobe. IMPRESSION: Suboptimal CTA. Delayed interpretation due to registration under  the wrong name. Very early arterial phase study with limited arterial opacification in the CTA. As best I can tell both carotid arteries are patent in the neck. There is occlusion of the right MCA at the bifurcation. CT perfusion markedly abnormal with large area of delayed perfusion right MCA territory. 219 mL of delayed perfusion. No core infarct. These results were called by telephone at the time of interpretation on 04/30/2019 at 12:30 pm to provider Aroor, who verbally acknowledged these results. Electronically Signed   By: Franchot Gallo M.D.   On: 04/30/2019 12:33   Dg Chest Port 1 View  Result Date: 04/30/2019 CLINICAL DATA:  Stroke EXAM: PORTABLE CHEST 1 VIEW COMPARISON:  08/25/2015 FINDINGS: Cardiomegaly. Both lungs are clear. The visualized skeletal structures are unremarkable. IMPRESSION: Cardiomegaly without acute abnormality of the lungs in AP portable projection. Electronically Signed   By: Eddie Candle M.D.   On: 04/30/2019 16:07   Ct Head Code Stroke Wo Contrast  Result Date: 04/30/2019 CLINICAL DATA:  Code stroke.  Left-sided weakness. EXAM: CT HEAD WITHOUT CONTRAST TECHNIQUE: Contiguous axial images were obtained from the base of the skull through the vertex without  intravenous contrast. COMPARISON:  None. FINDINGS: Brain: Chronic infarct right lateral temporal lobe. Chronic infarct left frontal lobe. Chronic infarct anterior limb internal capsule on the left. Negative for acute infarct. Negative for hemorrhage or mass. Mild ventricular enlargement most likely due to mild atrophy. Vascular: Hyperdense right MCA at the bifurcation. Probable acute thrombus. Skull: Negative Sinuses/Orbits: Cyst left maxillary sinus otherwise sinuses clear. Negative orbit. Other: None ASPECTS ([place] Stroke Program Early CT Score) - Ganglionic level infarction (caudate, lentiform nuclei, internal capsule, insula, M1-M3 cortex): 7 - Supraganglionic infarction (M4-M6 cortex): 3 Total score (0-10 with 10 being normal): 10 IMPRESSION: 1. Probable hyperdense right MCA bifurcation.  No acute infarct. 2. Atrophy with chronic right temporal infarct and chronic left frontal infarct. 3. ASPECTS is 10 4. Results texted to Dr. Lorraine Lax Electronically Signed   By: Franchot Gallo M.D.   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 Does the need for close, 24 hr/day medical supervision in concert with the patient's rehab needs make it unreasonable for this patient to be served in a less intensive setting? Yes 2. Co-Morbidities requiring supervision/potential complications: Afib, non compliance with Xarelto, morbid obesity, RIght hip contracture 3. Due to bladder management, bowel management, safety, skin/wound care, disease management, medication administration, pain management and patient education, does the patient require 24 hr/day rehab nursing? Yes 4. Does the patient require coordinated care of a physician, rehab nurse, therapy disciplines of PT, OT, SLP to address physical and functional deficits in the context of the above medical diagnosis(es)? Yes Addressing deficits in the following areas: balance, endurance, locomotion,  strength, transferring, bowel/bladder control, bathing, dressing, feeding, grooming, toileting, cognition, speech, language, swallowing and psychosocial support 5. Can the patient actively participate in an intensive therapy program of at least 3 hrs of therapy per day at least 5 days per week? Yes 6. The potential for patient to make measurable gains while on inpatient rehab is currently poor but should be ready in several days 7. Anticipated functional outcomes upon discharge from inpatient rehab are min assist  with PT, min assist with OT, min assist with SLP. 8. Estimated rehab length of stay to reach the above functional goals is: 20-23d 9. Anticipated discharge destination: Home 10. Overall Rehab/Functional Prognosis: fair  RECOMMENDATIONS: This patient's  condition is appropriate for continued rehabilitative care in the following setting: CIR once PEG placed and tolerating therapy off cleviprex Patient has agreed to participate in recommended program. unable to respond but shakes head Yes Note that insurance prior authorization may be required for reimbursement for recommended care.  Comment: suspect Right hip OA , ordered Xray  Cathlyn Parsons, PA-C 05/01/2019

## 2019-05-02 ENCOUNTER — Inpatient Hospital Stay (HOSPITAL_COMMUNITY): Payer: Medicare PPO

## 2019-05-02 DIAGNOSIS — I4821 Permanent atrial fibrillation: Secondary | ICD-10-CM

## 2019-05-02 DIAGNOSIS — F17200 Nicotine dependence, unspecified, uncomplicated: Secondary | ICD-10-CM

## 2019-05-02 DIAGNOSIS — E876 Hypokalemia: Secondary | ICD-10-CM

## 2019-05-02 LAB — CBC
HCT: 42.9 % (ref 39.0–52.0)
Hemoglobin: 13.9 g/dL (ref 13.0–17.0)
MCH: 27.1 pg (ref 26.0–34.0)
MCHC: 32.4 g/dL (ref 30.0–36.0)
MCV: 83.8 fL (ref 80.0–100.0)
Platelets: 190 10*3/uL (ref 150–400)
RBC: 5.12 MIL/uL (ref 4.22–5.81)
RDW: 15.2 % (ref 11.5–15.5)
WBC: 6.8 10*3/uL (ref 4.0–10.5)
nRBC: 0 % (ref 0.0–0.2)

## 2019-05-02 LAB — GLUCOSE, CAPILLARY
Glucose-Capillary: 110 mg/dL — ABNORMAL HIGH (ref 70–99)
Glucose-Capillary: 91 mg/dL (ref 70–99)
Glucose-Capillary: 90 mg/dL (ref 70–99)
Glucose-Capillary: 69 mg/dL — ABNORMAL LOW (ref 70–99)
Glucose-Capillary: 68 mg/dL — ABNORMAL LOW (ref 70–99)
Glucose-Capillary: 76 mg/dL (ref 70–99)
Glucose-Capillary: 76 mg/dL (ref 70–99)

## 2019-05-02 LAB — MAGNESIUM
Magnesium: 1.5 mg/dL — ABNORMAL LOW (ref 1.7–2.4)
Magnesium: 1.6 mg/dL — ABNORMAL LOW (ref 1.7–2.4)

## 2019-05-02 LAB — BASIC METABOLIC PANEL
Anion gap: 14 (ref 5–15)
BUN: 12 mg/dL (ref 8–23)
CO2: 23 mmol/L (ref 22–32)
Calcium: 8.9 mg/dL (ref 8.9–10.3)
Chloride: 101 mmol/L (ref 98–111)
Creatinine, Ser: 0.76 mg/dL (ref 0.61–1.24)
GFR calc Af Amer: >60 mL/min (ref >60)
GFR calc non Af Amer: >60 mL/min (ref >60)
Glucose, Bld: 93 mg/dL (ref 70–99)
Potassium: 3.2 mmol/L — ABNORMAL LOW (ref 3.5–5.1)
Sodium: 138 mmol/L (ref 135–145)

## 2019-05-02 LAB — PHOSPHORUS
Phosphorus: 3.8 mg/dL (ref 2.5–4.6)
Phosphorus: 3.1 mg/dL (ref 2.5–4.6)

## 2019-05-02 IMAGING — RF DG ABDOMEN 1V
1 series · 1 of 1 positions shown · IV contrast (omnipaque)
Comparison: None.

CLINICAL DATA: NG tube placement, history of gastric bypass

EXAM:
ABDOMEN - 1 VIEW; DG NASO G TUBE SKMUN/SKMUN
CONTRAST:  25mL OMNIPAQUE IOHEXOL 300 MG/ML  SOLN
FLUOROSCOPY TIME:  Fluoroscopy Time:  5 minutes 12 seconds
Number of Acquired Spot Images: 1

[Series 1: cp_standard · 0.25mm/px · 1 of 1 slices shown]
[im 1/1]
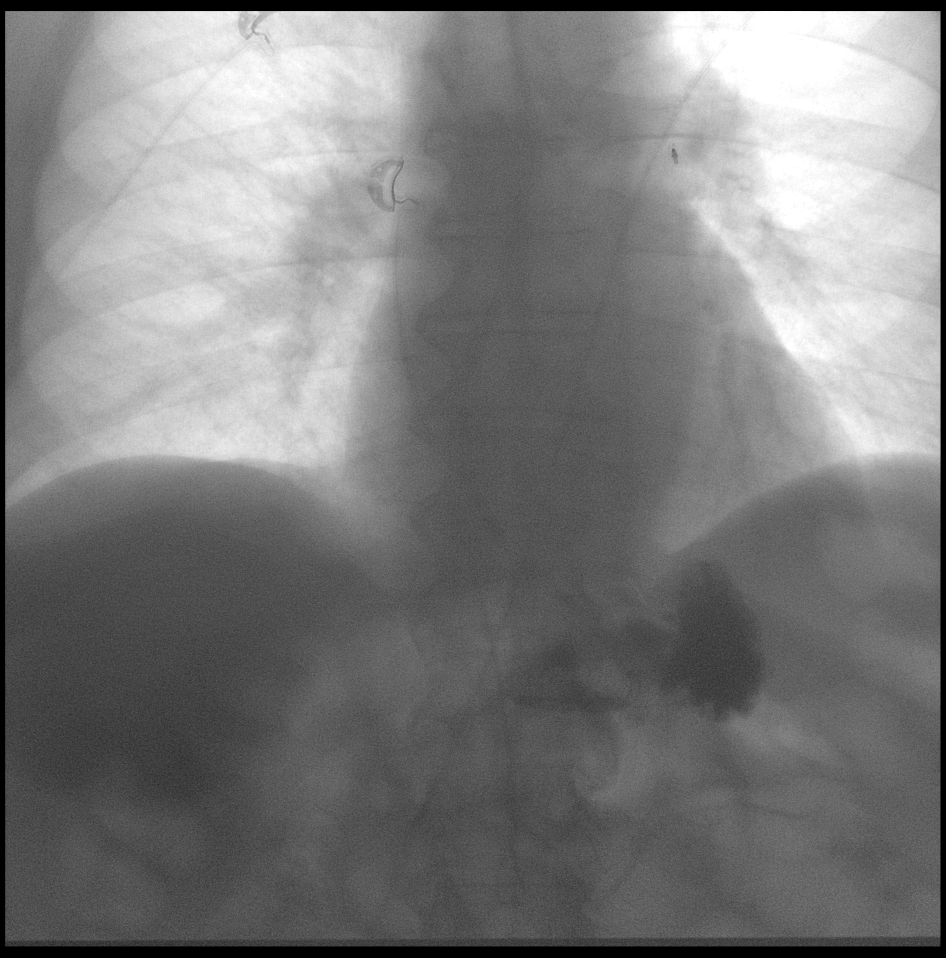

[1 of 1 positions shown; findings below may reference images not displayed]

FINDINGS: Enteric tube is not well seen but injected contrast opacifies the
lumen of the stomach.
IMPRESSION: Enteric tube placement within the stomach.

## 2019-05-02 MED ORDER — LIDOCAINE VISCOUS HCL 2 % MT SOLN
5.0000 mL | Freq: Once | OROMUCOSAL | Status: AC
  Administered 2019-05-02: 3 mL via OROMUCOSAL
  Filled 2019-05-02: qty 15

## 2019-05-02 MED ORDER — ACETAMINOPHEN 325 MG PO TABS
650.0000 mg | ORAL_TABLET | ORAL | Status: DC | PRN
  Administered 2019-05-08: 650 mg
  Filled 2019-05-02: qty 2

## 2019-05-02 MED ORDER — METOPROLOL TARTRATE 25 MG PO TABS
25.0000 mg | ORAL_TABLET | Freq: Two times a day (BID) | ORAL | Status: DC
  Filled 2019-05-02: qty 1

## 2019-05-02 MED ORDER — PAROXETINE HCL 10 MG PO TABS
10.0000 mg | ORAL_TABLET | Freq: Every day | ORAL | Status: DC
  Administered 2019-05-02: 16:00:00 10 mg via ORAL
  Filled 2019-05-02: qty 1

## 2019-05-02 MED ORDER — POTASSIUM CHLORIDE 20 MEQ/15ML (10%) PO SOLN
40.0000 meq | ORAL | Status: AC
  Administered 2019-05-02: 40 meq
  Filled 2019-05-02: qty 30

## 2019-05-02 MED ORDER — IOHEXOL 300 MG/ML  SOLN
50.0000 mL | Freq: Once | INTRAMUSCULAR | Status: AC | PRN
  Administered 2019-05-02: 25 mL

## 2019-05-02 MED ORDER — PAROXETINE HCL 10 MG PO TABS
10.0000 mg | ORAL_TABLET | Freq: Every day | ORAL | Status: DC
  Administered 2019-05-03 – 2019-05-15 (×13): 10 mg
  Filled 2019-05-02 (×14): qty 1

## 2019-05-02 MED ORDER — OXYCODONE HCL 5 MG PO TABS: 5.0000 mg | ORAL_TABLET | Freq: Four times a day (QID) | ORAL | Status: DC | PRN

## 2019-05-02 MED ORDER — ALBUTEROL SULFATE (2.5 MG/3ML) 0.083% IN NEBU
3.0000 mL | INHALATION_SOLUTION | RESPIRATORY_TRACT | Status: DC | PRN
  Administered 2019-05-17 – 2019-05-18 (×3): 3 mL via RESPIRATORY_TRACT
  Filled 2019-05-02 (×3): qty 3

## 2019-05-02 MED ORDER — POTASSIUM CHLORIDE 20 MEQ/15ML (10%) PO SOLN
40.0000 meq | ORAL | Status: DC
  Filled 2019-05-02: qty 30

## 2019-05-02 MED ORDER — PANTOPRAZOLE SODIUM 40 MG PO PACK
40.0000 mg | PACK | Freq: Every day | ORAL | Status: DC
  Administered 2019-05-02 – 2019-05-24 (×22): 40 mg
  Filled 2019-05-02 (×21): qty 20

## 2019-05-02 MED ORDER — LABETALOL HCL 5 MG/ML IV SOLN
10.0000 mg | INTRAVENOUS | Status: DC | PRN
  Administered 2019-05-02 (×2): 10 mg via INTRAVENOUS
  Filled 2019-05-02 (×2): qty 4

## 2019-05-02 MED ORDER — LISINOPRIL 10 MG PO TABS
10.0000 mg | ORAL_TABLET | Freq: Every day | ORAL | Status: DC
  Administered 2019-05-02: 16:00:00 10 mg via ORAL
  Filled 2019-05-02 (×2): qty 1

## 2019-05-02 MED ORDER — GABAPENTIN 250 MG/5ML PO SOLN
600.0000 mg | Freq: Four times a day (QID) | ORAL | Status: DC
  Administered 2019-05-02 – 2019-05-23 (×74): 600 mg
  Filled 2019-05-02 (×93): qty 12

## 2019-05-02 MED ORDER — MAGNESIUM SULFATE 4 GM/100ML IV SOLN
4.0000 g | Freq: Once | INTRAVENOUS | Status: AC
  Administered 2019-05-02: 4 g via INTRAVENOUS
  Filled 2019-05-02: qty 100

## 2019-05-02 MED ORDER — SENNOSIDES-DOCUSATE SODIUM 8.6-50 MG PO TABS: 1.0000 | ORAL_TABLET | Freq: Every evening | ORAL | Status: DC | PRN

## 2019-05-02 MED ORDER — POTASSIUM CHLORIDE 10 MEQ/100ML IV SOLN
10.0000 meq | INTRAVENOUS | Status: DC
  Administered 2019-05-02 (×2): 10 meq via INTRAVENOUS
  Filled 2019-05-02: qty 100

## 2019-05-02 MED ORDER — OXYCODONE HCL 5 MG PO TABS
5.0000 mg | ORAL_TABLET | Freq: Four times a day (QID) | ORAL | Status: DC | PRN
  Administered 2019-05-02 – 2019-05-14 (×30): 5 mg
  Filled 2019-05-02 (×31): qty 1

## 2019-05-02 MED ORDER — ATORVASTATIN CALCIUM 40 MG PO TABS: 40.0000 mg | ORAL_TABLET | Freq: Every day | ORAL | Status: DC

## 2019-05-02 MED ORDER — METOPROLOL TARTRATE 25 MG PO TABS
25.0000 mg | ORAL_TABLET | Freq: Two times a day (BID) | ORAL | Status: DC
  Administered 2019-05-02 – 2019-05-04 (×5): 25 mg
  Filled 2019-05-02 (×4): qty 1

## 2019-05-02 MED ORDER — ASPIRIN 325 MG PO TABS
325.0000 mg | ORAL_TABLET | Freq: Every day | ORAL | Status: DC
  Administered 2019-05-03 – 2019-05-04 (×2): 325 mg
  Filled 2019-05-02 (×2): qty 1

## 2019-05-02 MED ORDER — GABAPENTIN 300 MG PO CAPS
600.0000 mg | ORAL_CAPSULE | Freq: Four times a day (QID) | ORAL | Status: DC
  Administered 2019-05-02: 600 mg via ORAL
  Filled 2019-05-02: qty 2

## 2019-05-02 MED ORDER — DEXTROSE 50 % IV SOLN
INTRAVENOUS | Status: AC
  Administered 2019-05-02: 50 mL
  Filled 2019-05-02: qty 50

## 2019-05-02 MED ORDER — JEVITY 1.2 CAL PO LIQD
1000.0000 mL | ORAL | Status: AC
  Administered 2019-05-02 – 2019-05-15 (×11): 1000 mL
  Filled 2019-05-02 (×30): qty 1000

## 2019-05-02 MED ORDER — ACETAMINOPHEN 160 MG/5ML PO SOLN
650.0000 mg | ORAL | Status: DC | PRN
  Administered 2019-05-05 – 2019-05-21 (×7): 650 mg
  Filled 2019-05-02 (×8): qty 20.3

## 2019-05-02 MED ORDER — PRO-STAT SUGAR FREE PO LIQD: 30.0000 mL | Freq: Two times a day (BID) | ORAL | Status: DC

## 2019-05-02 MED ORDER — LIDOCAINE HCL (PF) 1 % IJ SOLN: 5.0000 mL | Freq: Once | INTRAMUSCULAR | Status: DC

## 2019-05-02 MED ORDER — AMITRIPTYLINE HCL 25 MG PO TABS: 50.0000 mg | ORAL_TABLET | Freq: Every day | ORAL | Status: DC

## 2019-05-02 MED ORDER — METOPROLOL TARTRATE 25 MG PO TABS: 25.0000 mg | ORAL_TABLET | Freq: Two times a day (BID) | ORAL | Status: DC

## 2019-05-02 MED ORDER — VITAL HIGH PROTEIN PO LIQD: 1000.0000 mL | ORAL | Status: DC

## 2019-05-02 MED ORDER — ATORVASTATIN CALCIUM 40 MG PO TABS
40.0000 mg | ORAL_TABLET | Freq: Every day | ORAL | Status: DC
  Administered 2019-05-02 – 2019-05-15 (×12): 40 mg
  Filled 2019-05-02 (×12): qty 1

## 2019-05-02 MED ORDER — GABAPENTIN 250 MG/5ML PO SOLN
600.0000 mg | Freq: Four times a day (QID) | ORAL | Status: DC
  Filled 2019-05-02 (×2): qty 12

## 2019-05-02 MED ORDER — PANTOPRAZOLE SODIUM 40 MG PO TBEC: 40.0000 mg | DELAYED_RELEASE_TABLET | Freq: Every day | ORAL | Status: DC

## 2019-05-02 MED ORDER — AMITRIPTYLINE HCL 25 MG PO TABS
50.0000 mg | ORAL_TABLET | Freq: Every day | ORAL | Status: DC
  Administered 2019-05-02 – 2019-05-15 (×13): 50 mg
  Filled 2019-05-02 (×13): qty 2

## 2019-05-02 MED ORDER — PRO-STAT SUGAR FREE PO LIQD
30.0000 mL | Freq: Three times a day (TID) | ORAL | Status: DC
  Administered 2019-05-02 – 2019-05-24 (×59): 30 mL
  Filled 2019-05-02 (×62): qty 30

## 2019-05-02 MED ORDER — ACETAMINOPHEN 650 MG RE SUPP: 650.0000 mg | RECTAL | Status: DC | PRN

## 2019-05-02 NOTE — Procedures (Signed)
Cortrak  Person Inserting Tube:  Rosezetta Schlatter, RD Tube Type:  Cortrak - 43 inches Tube Location:  Left nare Initial Placement:  Stomach Secured by: Bridle Technique Used to Measure Tube Placement:  Documented cm marking at nare/ corner of mouth Cortrak Secured At:  52 cm Procedure Comments:  Cortrak Tube Team Note:  Consult received to place a Cortrak feeding tube.   Attempts were made for a prolonged period by two Cortrak team members. Tube was unable to be advanced beyond 52-55 cm without coiling/kinking and with pressure being felt around this mark. Cortrak tube was bridled at 52 cm and Diagnostics alerted that tube will need to be advanced by their team.    If the tube becomes dislodged please keep the tube and contact the Cortrak team at [email or web address] (password [email or web address]) for replacement.  If after hours and replacement cannot be delayed, place a NG tube and confirm placement with an abdominal x-ray.      Jarome Matin, MS, RD, LDN, [facility] Inpatient Clinical Dietitian Pager # [phone] After hours/weekend pager # [phone]

## 2019-05-02 NOTE — Progress Notes (Signed)
STROKE TEAM PROGRESS NOTE   INTERVAL HISTORY Pt sitting in chair, working with PT/OT and left sided weakness much improved. However, still anarthric and not able to have language outpt. Does not think he can pass swallow, will put in cortrak and start tube feeding.     Vitals:   05/02/19 0800 05/02/19 0830 05/02/19 0900 05/02/19 0930  BP:  (!) 157/99 (!) 164/98 (!) 127/100  Pulse: 88 78 78 87  Resp: 16 12 14 17   Temp: (!) 97.2 F (36.2 C)     TempSrc: Axillary     SpO2: 95% 98% 99% 96%  Weight:      Height:        CBC:  Recent Labs  Lab 04/30/19 1152  05/01/19 0630 05/02/19 0542  WBC 9.2  --  11.3* 6.8  NEUTROABS 7.0  --  9.2*  --   HGB 15.5   < > 15.4 13.9  HCT 48.0   < > 46.5 42.9  MCV 85.6  --  84.2 83.8  PLT 241  --  157 190   < > = values in this interval not displayed.    Basic Metabolic Panel:  Recent Labs  Lab 05/01/19 0630 05/02/19 0542  NA 136 138  K 4.2 3.2*  CL 101 101  CO2 19* 23  GLUCOSE 113* 93  BUN 12 12  CREATININE 0.73 0.76  CALCIUM 8.8* 8.9   Lipid Panel:     Component Value Date/Time   CHOL 102 05/01/2019 0630   CHOL 142 12/19/2014 0804   TRIG 98 05/01/2019 0630   HDL 25 (L) 05/01/2019 0630   HDL 29 (L) 12/19/2014 0804   CHOLHDL 4.1 05/01/2019 0630   VLDL 20 05/01/2019 0630   LDLCALC 57 05/01/2019 0630   LDLCALC 93 12/19/2014 0804   HgbA1c:  Lab Results  Component Value Date   HGBA1C 5.5 05/01/2019   Urine Drug Screen:     Component Value Date/Time   LABOPIA NONE DETECTED 04/30/2019 1507   COCAINSCRNUR NONE DETECTED 04/30/2019 1507   COCAINSCRNUR Negative 03/15/2015 1141   LABBENZ NONE DETECTED 04/30/2019 1507   AMPHETMU POSITIVE (A) 04/30/2019 1507   THCU NONE DETECTED 04/30/2019 1507   LABBARB NONE DETECTED 04/30/2019 1507    Alcohol Level     Component Value Date/Time   ETH <10 04/30/2019 1152    IMAGING Ct Code Stroke Cta Head W/wo Contrast  Result Date: 04/30/2019 CLINICAL DATA:  Stroke. Left-sided  weakness. Atrial fibrillation off anticoagulation. EXAM: CT ANGIOGRAPHY HEAD AND NECK CT PERFUSION BRAIN TECHNIQUE: Multidetector CT imaging of the head and neck was performed using the standard protocol during bolus administration of intravenous contrast. Multiplanar CT image reconstructions and MIPs were obtained to evaluate the vascular anatomy. Carotid stenosis measurements (when applicable) are obtained utilizing NASCET criteria, using the distal internal carotid diameter as the denominator. Multiphase CT imaging of the brain was performed following IV bolus contrast injection. Subsequent parametric perfusion maps were calculated using RAPID software. CONTRAST:  178m OMNIPAQUE IOHEXOL 350 MG/ML SOLN COMPARISON:  CT head 04/30/2019 FINDINGS: CTA NECK FINDINGS Aortic arch: Suboptimal arterial opacification. Contrast is in the venous phase with limited arterial contrast. Atherosclerotic disease in the aorta. Proximal great vessels patent. Image interpretation delayed as patient was registered under the wrong name. Right carotid system: Mild atherosclerotic disease right carotid bifurcation without significant stenosis Left carotid system: Mild atherosclerotic disease left bifurcation without significant stenosis. Vertebral arteries: Both vertebral arteries are patent. Limited contrast opacification. Skeleton: No acute  abnormality. Other neck: Negative for mass or adenopathy. Upper chest: Negative Review of the MIP images confirms the above findings CTA HEAD FINDINGS Anterior circulation: Mild atherosclerotic disease in the cavernous carotid bilaterally. Right MCA occlusion at the bifurcation. Chronic infarct right temporal lobe. Both anterior cerebral arteries patent. Left middle cerebral artery widely patent. Posterior circulation: Vertebral arteries not well visualized. Basilar artery not well visualized due to lack of adequate contrast. Fetal origin of the posterior cerebral arteries bilaterally Venous  sinuses: No venous contrast present. Anatomic variants: None Review of the MIP images confirms the above findings CT Brain Perfusion Findings: ASPECTS: 10 CBF (<30%) Volume: 58m Perfusion (Tmax>6.0s) volume: 2141mMismatch Volume: 02/01/2019mL Infarction Location:Right MCA territory involving the right lateral temporal lobe and parietal lobe. IMPRESSION: Suboptimal CTA. Delayed interpretation due to registration under the wrong name. Very early arterial phase study with limited arterial opacification in the CTA. As best I can tell both carotid arteries are patent in the neck. There is occlusion of the right MCA at the bifurcation. CT perfusion markedly abnormal with large area of delayed perfusion right MCA territory. 219 mL of delayed perfusion. No core infarct. These results were called by telephone at the time of interpretation on 04/30/2019 at 12:30 pm to provider Aroor, who verbally acknowledged these results. Electronically Signed   By: ChFranchot Gallo.D.   On: 04/30/2019 12:33   Ct Code Stroke Cta Neck W/wo Contrast  Result Date: 04/30/2019 CLINICAL DATA:  Stroke. Left-sided weakness. Atrial fibrillation off anticoagulation. EXAM: CT ANGIOGRAPHY HEAD AND NECK CT PERFUSION BRAIN TECHNIQUE: Multidetector CT imaging of the head and neck was performed using the standard protocol during bolus administration of intravenous contrast. Multiplanar CT image reconstructions and MIPs were obtained to evaluate the vascular anatomy. Carotid stenosis measurements (when applicable) are obtained utilizing NASCET criteria, using the distal internal carotid diameter as the denominator. Multiphase CT imaging of the brain was performed following IV bolus contrast injection. Subsequent parametric perfusion maps were calculated using RAPID software. CONTRAST:  10017mMNIPAQUE IOHEXOL 350 MG/ML SOLN COMPARISON:  CT head 04/30/2019 FINDINGS: CTA NECK FINDINGS Aortic arch: Suboptimal arterial opacification. Contrast is in the  venous phase with limited arterial contrast. Atherosclerotic disease in the aorta. Proximal great vessels patent. Image interpretation delayed as patient was registered under the wrong name. Right carotid system: Mild atherosclerotic disease right carotid bifurcation without significant stenosis Left carotid system: Mild atherosclerotic disease left bifurcation without significant stenosis. Vertebral arteries: Both vertebral arteries are patent. Limited contrast opacification. Skeleton: No acute abnormality. Other neck: Negative for mass or adenopathy. Upper chest: Negative Review of the MIP images confirms the above findings CTA HEAD FINDINGS Anterior circulation: Mild atherosclerotic disease in the cavernous carotid bilaterally. Right MCA occlusion at the bifurcation. Chronic infarct right temporal lobe. Both anterior cerebral arteries patent. Left middle cerebral artery widely patent. Posterior circulation: Vertebral arteries not well visualized. Basilar artery not well visualized due to lack of adequate contrast. Fetal origin of the posterior cerebral arteries bilaterally Venous sinuses: No venous contrast present. Anatomic variants: None Review of the MIP images confirms the above findings CT Brain Perfusion Findings: ASPECTS: 10 CBF (<30%) Volume: 0mL71mrfusion (Tmax>6.0s) volume: 219mL82mmatch Volume: 02/01/2019mL Infarction Location:Right MCA territory involving the right lateral temporal lobe and parietal lobe. IMPRESSION: Suboptimal CTA. Delayed interpretation due to registration under the wrong name. Very early arterial phase study with limited arterial opacification in the CTA. As best I can tell both carotid arteries are patent in the neck. There is  occlusion of the right MCA at the bifurcation. CT perfusion markedly abnormal with large area of delayed perfusion right MCA territory. 219 mL of delayed perfusion. No core infarct. These results were called by telephone at the time of interpretation on  04/30/2019 at 12:30 pm to provider Aroor, who verbally acknowledged these results. Electronically Signed   By: Franchot Gallo M.D.   On: 04/30/2019 12:33   Mr Virgel Paling FT Contrast  Result Date: 05/01/2019 CLINICAL DATA:  63 year old male code stroke presentation with right MCA bifurcation occlusion treated with endovascular reperfusion. EXAM: MRA HEAD WITHOUT CONTRAST TECHNIQUE: Angiographic images of the Circle of Willis were obtained using MRA technique without intravenous contrast. COMPARISON:  MRI performed at the same time. CTA head and neck and CT Perfusion 04/30/2019. FINDINGS: Study is mildly degraded by motion artifact despite repeated imaging attempts. Motion degraded distal vertebral arteries, but patent basilar artery with no definite stenosis. The basilar functionally terminates in the SCA is with fetal type bilateral PCA origins. Symmetric bilateral PCA flow signal. Motion degraded proximal ICA siphons. Both ICAs are patent with symmetric flow signal at the carotid termini. Patent MCA and ACA origins. Visible bilateral ACA branches are within normal limits. Symmetric flow signal in the bilateral MCA M1 segments and at the bifurcations. Bilateral MCA branch detail is degraded by motion. But right MCA branch patency appears improved from the earlier CTA. IMPRESSION: Motion degraded exam with improved right MCA patency from the pre-treatment CTA. Electronically Signed   By: Genevie Ann M.D.   On: 05/01/2019 03:43   Mr Brain Wo Contrast  Result Date: 05/01/2019 CLINICAL DATA:  63 year old male code stroke presentation with right MCA bifurcation occlusion treated with endovascular reperfusion. EXAM: MRI HEAD WITHOUT CONTRAST TECHNIQUE: Multiplanar, multiecho pulse sequences of the brain and surrounding structures were obtained without intravenous contrast. COMPARISON:  04/30/2019 CT head, CTA and CT Perfusion. FINDINGS: The examination had to be discontinued prior to completion due to patient  agitation, and is intermittently motion degraded. Coronal T2 and axial T1 weighted imaging was not obtained. Brain: Restricted diffusion at the right frontal operculum and involving a portion of the right insula, and some of the deep white matter capsule. This encompasses an area of about 31 x 36 x 60 millimeters (approximately 33 milliliters). FLAIR more so than T2 hyperintensity in the affected parenchyma. No associated hemorrhage is evident on motion degraded SWI. No mass effect. Possible small area of restricted diffusion also in the right inferior frontal gyrus on series 9, image 69. No restricted diffusion in any other vascular territory. Chronic anterior left MCA and right temporal lobe encephalomalacia. Mild ex vacuo ventricular enlargement. Negative brainstem and cerebellum. No midline shift, mass effect, evidence of mass lesion, extra-axial collection. Cervicomedullary junction and pituitary are within normal limits. Vascular: Major intracranial vascular flow voids are preserved. Skull and upper cervical spine: Negative visible cervical spine. Normal bone marrow signal. Sinuses/Orbits: Mild rightward gaze deviation. Otherwise negative orbits. Partially visible paranasal sinus mucosal thickening. Other: Mastoids are clear. IMPRESSION: 1. Truncated and intermittently motion degraded exam due to patient agitation. 2. Right MCA territory restricted diffusion primarily involving the operculum. No associated hemorrhage or mass effect. 3. Underlying bilateral MCA territory chronic encephalomalacia. Electronically Signed   By: Genevie Ann M.D.   On: 05/01/2019 03:39   Ct Code Stroke Cerebral Perfusion With Contrast  Result Date: 04/30/2019 CLINICAL DATA:  Stroke. Left-sided weakness. Atrial fibrillation off anticoagulation. EXAM: CT ANGIOGRAPHY HEAD AND NECK CT PERFUSION BRAIN TECHNIQUE: Multidetector CT  imaging of the head and neck was performed using the standard protocol during bolus administration of  intravenous contrast. Multiplanar CT image reconstructions and MIPs were obtained to evaluate the vascular anatomy. Carotid stenosis measurements (when applicable) are obtained utilizing NASCET criteria, using the distal internal carotid diameter as the denominator. Multiphase CT imaging of the brain was performed following IV bolus contrast injection. Subsequent parametric perfusion maps were calculated using RAPID software. CONTRAST:  161m OMNIPAQUE IOHEXOL 350 MG/ML SOLN COMPARISON:  CT head 04/30/2019 FINDINGS: CTA NECK FINDINGS Aortic arch: Suboptimal arterial opacification. Contrast is in the venous phase with limited arterial contrast. Atherosclerotic disease in the aorta. Proximal great vessels patent. Image interpretation delayed as patient was registered under the wrong name. Right carotid system: Mild atherosclerotic disease right carotid bifurcation without significant stenosis Left carotid system: Mild atherosclerotic disease left bifurcation without significant stenosis. Vertebral arteries: Both vertebral arteries are patent. Limited contrast opacification. Skeleton: No acute abnormality. Other neck: Negative for mass or adenopathy. Upper chest: Negative Review of the MIP images confirms the above findings CTA HEAD FINDINGS Anterior circulation: Mild atherosclerotic disease in the cavernous carotid bilaterally. Right MCA occlusion at the bifurcation. Chronic infarct right temporal lobe. Both anterior cerebral arteries patent. Left middle cerebral artery widely patent. Posterior circulation: Vertebral arteries not well visualized. Basilar artery not well visualized due to lack of adequate contrast. Fetal origin of the posterior cerebral arteries bilaterally Venous sinuses: No venous contrast present. Anatomic variants: None Review of the MIP images confirms the above findings CT Brain Perfusion Findings: ASPECTS: 10 CBF (<30%) Volume: 054mPerfusion (Tmax>6.0s) volume: 21966mismatch Volume:  02/01/2019mL Infarction Location:Right MCA territory involving the right lateral temporal lobe and parietal lobe. IMPRESSION: Suboptimal CTA. Delayed interpretation due to registration under the wrong name. Very early arterial phase study with limited arterial opacification in the CTA. As best I can tell both carotid arteries are patent in the neck. There is occlusion of the right MCA at the bifurcation. CT perfusion markedly abnormal with large area of delayed perfusion right MCA territory. 219 mL of delayed perfusion. No core infarct. These results were called by telephone at the time of interpretation on 04/30/2019 at 12:30 pm to provider Aroor, who verbally acknowledged these results. Electronically Signed   By: ChaFranchot GalloD.   On: 04/30/2019 12:33   Dg Chest Port 1 View  Result Date: 04/30/2019 CLINICAL DATA:  Stroke EXAM: PORTABLE CHEST 1 VIEW COMPARISON:  08/25/2015 FINDINGS: Cardiomegaly. Both lungs are clear. The visualized skeletal structures are unremarkable. IMPRESSION: Cardiomegaly without acute abnormality of the lungs in AP portable projection. Electronically Signed   By: AleEddie CandleD.   On: 04/30/2019 16:07   Ct Head Code Stroke Wo Contrast  Result Date: 04/30/2019 CLINICAL DATA:  Code stroke.  Left-sided weakness. EXAM: CT HEAD WITHOUT CONTRAST TECHNIQUE: Contiguous axial images were obtained from the base of the skull through the vertex without intravenous contrast. COMPARISON:  None. FINDINGS: Brain: Chronic infarct right lateral temporal lobe. Chronic infarct left frontal lobe. Chronic infarct anterior limb internal capsule on the left. Negative for acute infarct. Negative for hemorrhage or mass. Mild ventricular enlargement most likely due to mild atrophy. Vascular: Hyperdense right MCA at the bifurcation. Probable acute thrombus. Skull: Negative Sinuses/Orbits: Cyst left maxillary sinus otherwise sinuses clear. Negative orbit. Other: None ASPECTS ([facility]  Early CT Score) - Ganglionic level infarction (caudate, lentiform nuclei, internal capsule, insula, M1-M3 cortex): 7 - Supraganglionic infarction (M4-M6 cortex): 3 Total score (0-10 with 10  being normal): 10 IMPRESSION: 1. Probable hyperdense right MCA bifurcation.  No acute infarct. 2. Atrophy with chronic right temporal infarct and chronic left frontal infarct. 3. ASPECTS is 10 4. Results texted to Dr. Lorraine Lax Electronically Signed   By: Franchot Gallo M.D.   On: 04/30/2019 11:54   Cerebral angiogram 05/01/2019 S/P complete revascularization of occluded RT MCA M1 seg with x 1 pass with SolitaireX 44m x 40 mm ret river ,and aspiration achieving a TICI 2c revascularization.  2D Echocardiogram  1. Left ventricular ejection fraction, by visual estimation, is 60 to 65%. The left ventricle has normal function. There is moderately increased left ventricular hypertrophy.  2. Left ventricular diastolic function could not be evaluated.  3. Global right ventricle has normal systolic function.The right ventricular size is normal. No increase in right ventricular wall thickness.  4. Left atrial size was severely dilated.  5. Right atrial size was mildly dilated.  6. Mild to moderate mitral annular calcification.  7. The mitral valve is grossly normal. No evidence of mitral valve regurgitation.  8. The tricuspid valve is grossly normal. Tricuspid valve regurgitation is trivial.  9. The aortic valve is tricuspid. Aortic valve regurgitation is mild. Mild aortic valve sclerosis without stenosis. 10. The pulmonic valve was not well visualized. Pulmonic valve regurgitation is not visualized.   PHYSICAL EXAM    Temp:  [97.2 F (36.2 C)-98.5 F (36.9 C)] 97.2 F (36.2 C) (11/17 0800) Pulse Rate:  [64-93] 87 (11/17 0930) Resp:  [11-22] 17 (11/17 0930) BP: (116-178)/(55-148) 127/100 (11/17 0930) SpO2:  [88 %-99 %] 96 % (11/17 0930)  General - obese, well developed, not in acute distress.  Ophthalmologic -  fundi not visualized due to noncooperation.  Cardiovascular - irregularly irregular heart rate and rhythm.  Neuro - awake alert, still anarthric, not able to answer orientation questions. Follows all simple commands, not able to name or repeat. PERRL, right gaze full but incomplete left gaze, blinking to visual threat bilaterally. Bilateral facial weakness but left seems more than right. Tongue protrusion difficulty. RUE 5/5, LUE 4/5 proximal and 3/5 finger grip. RLE 5/5. LLE 4/5 proximal and 4+/5 distal. DTR 1+ and no babinski. Sensation symmetrical. Bilateral FTN intact. Gait not tested but as per PT, pt took steps from bed to chair.    ASSESSMENT/PLAN Mr. SRANCE SMITHSONis a 63y.o. male with history of a. Fib ( not anticoagulated) sick sinus syndrom, HTN, CVA, OSA ( not on CPAP) presenting with right side gaze and left arm paralysis.   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 Probably hyperdense R MCA bifurcation. Old R temporal lobe and L frontal lobe infarcts. .     CTA head & neck suboptimal. Occlusion R MCA at bifurcation.  CT perfusion large delayed perfusion R MCA. No core infarct.   Cerebral angio occluded R M1, TICI2c   MRI  R MCA infarct (operculum). B MCA encephalomalacia  MRA  Improved R MCA patency  2D Echo EF 60-65%. No source of embolus   LDL 57   HgbA1c 5.5   HIV neg  Lovenox 40 mg sq daily for VTE prophylaxis  aspirin 81 mg daily prior to admission, now on aspirin PR 300 mg daily. Will consider DOAC from tomorrow once po access   Therapy recommendations:  CIR   Disposition:  pending  Atrial Fibrillation  Home anticoagulation:  not taking   Started on Xarelto 09/15/2018, documented not taking 04/23/2019 (family practice note)  Now on ASA PR . Resume DOAC tomorrow once po access    Hypertension  Home meds:  Lisinopril  20 . Treated w/ cleviprex, now off. Labetalol prn  . BP goal < 180 . BP stable . Put on PO BP meds once po access   . Long-term BP goal normotensive  Hyperlipidemia  Home meds:  Prescribed lipitor - ? taking  LDL 57, goal < 70  Resume statin once po access  Continue statin at discharge  Dysphagia . Secondary to stroke . NPO . Speech on board . Place cortrak today and start TF  Tobacco abuse  Current smoker  Smoking cessation counseling provided  Pt is willing to quit   Other Stroke Risk Factors  ETOH use, alcohol level <10, advised to drink no more than 2 drink(s) a day  Substance abuse UDS:  Amphetamines  POSITIVE  Morbid Obesity, Body mass index is 43.37 kg/m., hx failed open gastroplasty in 1993, recommend weight loss, diet and exercise as appropriate   Hx stroke/TIA by imaging - Old R temporal lobe and L frontal lobe infarcts - likely due to AFib  Obstructive sleep apnea, not on CPAP at home  Other Active Problems  Leukocytosis WBC 9.2-> 11.3  COPD  Hypokalemia 3.2 - supplement - recheck am.    Hospital day # 2  This patient is critically ill due to right MCA stroke due to right MCA occlusion s/p MT, hypertension, afib not on AC and at significant risk of neurological worsening, death form recurrent stroke, hemorrhagic conversion, heart failure, seizure. This patient's care requires constant monitoring of vital signs, hemodynamics, respiratory and cardiac monitoring, review of multiple databases, neurological assessment, discussion with family, other specialists and medical decision making of high complexity. I spent 35 minutes of neurocritical care time in the care of this patient.  [name], MD PhD Stroke Neurology 05/02/2019 9:57 AM   To contact Stroke Continuity provider, please refer to [email or web address] After hours, contact General Neurology

## 2019-05-02 NOTE — Progress Notes (Signed)
Initial Nutrition Assessment  DOCUMENTATION CODES:   Morbid obesity  INTERVENTION:    Jevity 1.2 at 25 ml/h, increase by 10 ml every 4 hours to goal rate of 75 ml/h (1800 ml per day)   Pro-stat 30 ml TID   Provides 2460 kcal, 145 gm protein, 1458 ml free water daily  NUTRITION DIAGNOSIS:   Inadequate oral intake related to dysphagia as evidenced by NPO status.  GOAL:   Patient will meet greater than or equal to 90% of their needs  MONITOR:   TF tolerance, Skin, Labs, Diet advancement  REASON FOR ASSESSMENT:   Consult Enteral/tube feeding initiation and management  ASSESSMENT:   63 yo male admitted with R MCA infarct due to R MCA occlusion. S/P revascularization. PMH includes A fib, GERD, HTN, PVD (venous insufficiency), CVA, sleep apnea.   SLP following for cognitive and swallowing needs. Patient is anarthric. He is not ready for PO's at this time, high aspiration risk, remains NPO. Cortrak has been ordered. Received MD Consult for TF initiation and management.  Patient was able to nod yes/no to answer RD questions. He reports eating well PTA with no recent weight changes.   Labs reviewed. Potassium 3.2 (L) CBG's: 87-91-90  Medications reviewed and include IV KCl x 4 runs.  Usual weights reviewed. No significant weight changes noted.  NUTRITION - FOCUSED PHYSICAL EXAM:    Most Recent Value  Orbital Region  No depletion  Upper Arm Region  No depletion  Thoracic and Lumbar Region  No depletion  Buccal Region  No depletion  Temple Region  No depletion  Clavicle Bone Region  No depletion  Clavicle and Acromion Bone Region  No depletion  Scapular Bone Region  No depletion  Dorsal Hand  No depletion  Patellar Region  No depletion  Anterior Thigh Region  No depletion  Posterior Calf Region  No depletion  Edema (RD Assessment)  Mild  Hair  Reviewed  Eyes  Reviewed  Mouth  Reviewed  Skin  Reviewed  Nails  Reviewed       Diet Order:   Diet Order         Diet NPO time specified  Diet effective now              EDUCATION NEEDS:   Not appropriate for education at this time  Skin:  Skin Assessment: Reviewed RN Assessment(MASD to abdomen, chest, & buttocks)  Last BM:  11/15  Height:   Ht Readings from Last 1 Encounters:  04/30/19 6\' 3"  (1.905 m)    Weight:   Wt Readings from Last 1 Encounters:  04/30/19 (!) 157.4 kg    Ideal Body Weight:  89.1 kg  BMI:  Body mass index is 43.37 kg/m.  Estimated Nutritional Needs:   Kcal:  [id number]  Protein:  140-160 gm  Fluid:  >/= 2.5 L    Molli Barrows, RD, LDN, [place] Pager [phone] After Hours Pager [phone]

## 2019-05-02 NOTE — Plan of Care (Signed)
RN asked pt who we should contact and give update to. Pt wrote down [phone] for his mom. I tried this number but nobody available and not able to leave VM.   [name], MD PhD Stroke Neurology 05/02/2019 10:45 AM

## 2019-05-02 NOTE — Progress Notes (Signed)
Physical Therapy Treatment Patient Details Name: Kyle [name] MRN: [id number] DOB: 01-16-1956 Today's Date: 05/02/2019    History of Present Illness Kyle [name] is an 63 y.o. male  With PMH a. Fib ( not anticoagulated) sick sinus syndrom, HTN, CVA, OSA ( not on CPAP) who presented to [facility] as a code stroke. Pt found to have R MCA acute ischemic stroke due to right M1 occlusion, pt s/p mechanical thrombectomy.    PT Comments    Pt with significant improvement today. Pt able to raise L UE up overhead, initiate fine motor with L hand, and complete L LE LAQ in full range. Pt using L UE functionally to assist with transfers and standing. Pt able to sit EOB in midline today and worked on dynamic reaching with L UE and pt able to maintain balance. Pt able to stand and complete std pvt to chair with modAX2. Pt cont with R gaze preference, overall L sided weakness, and L knee buckling with standing, stepping. Pt demonstrates excellent rehab potential and continues to benefit from CIR upon d/c.    Follow Up Recommendations  CIR     Equipment Recommendations       Recommendations for Other Services       Precautions / Restrictions Precautions Precautions: Fall Restrictions Weight Bearing Restrictions: No    Mobility  Bed Mobility Overal bed mobility: Needs Assistance Bed Mobility: Supine to Sit     Supine to sit: Mod assist     General bed mobility comments: HOB elevated, pt able to bring both LEs to EOB and used L and R UE to pull up trunk, modA to scoot L side to EOB for feet to be on the floor  Transfers Overall transfer level: Needs assistance Equipment used: 2 person hand held assist Transfers: Sit to/from [facility] Sit to Stand: Mod assist;+2 physical assistance Stand pivot transfers: Mod assist;+2 physical assistance       General transfer comment: Pt powered up well, L knee blocked, provided walker for UE support, pt then able to complete 5 steps  during std pvt to transfer to chair, pt with noted L LE buckling requiring modA to stabilize but was able to advance it  Ambulation/Gait             General Gait Details: limited to 5 steps to chair today   Stairs             Wheelchair Mobility    Modified Rankin (Stroke Patients Only) Modified Rankin (Stroke Patients Only) Pre-Morbid Rankin Score: No significant disability Modified Rankin: Moderately severe disability     Balance Overall balance assessment: Needs assistance Sitting-balance support: Feet supported;No upper extremity supported Sitting balance-Leahy Scale: Fair Sitting balance - Comments: pt able to maintain EOB sitting in midline, no L lateral lean today, pt able to complete Bilat LE ther ex at EOB without LOB   Standing balance support: Bilateral upper extremity supported Standing balance-Leahy Scale: Poor Standing balance comment: dependent on RW or physical assist                            Cognition Arousal/Alertness: Awake/alert Behavior During Therapy: WFL for tasks assessed/performed Overall Cognitive Status: Impaired/Different from baseline Area of Impairment: Problem solving                       Following Commands: Follows one step commands consistently     Problem Solving:  Slow processing;Difficulty sequencing;Requires verbal cues;Requires tactile cues General Comments: pt more aware of L side but continues with R gaze preference but will look to the L       Exercises      General Comments General comments (skin integrity, edema, etc.): VSS      Pertinent Vitals/Pain Pain Assessment: No/denies pain    Home Living                      Prior Function            PT Goals (current goals can now be found in the care plan section) Progress towards PT goals: Progressing toward goals    Frequency    Min 4X/week      PT Plan Current plan remains appropriate    Co-evaluation               AM-PAC PT "6 Clicks" Mobility   Outcome Measure  Help needed turning from your back to your side while in a flat bed without using bedrails?: A Little Help needed moving from lying on your back to sitting on the side of a flat bed without using bedrails?: A Little Help needed moving to and from a bed to a chair (including a wheelchair)?: A Lot Help needed standing up from a chair using your arms (e.g., wheelchair or bedside chair)?: A Lot Help needed to walk in hospital room?: Total Help needed climbing 3-5 steps with a railing? : Total 6 Click Score: 12    End of Session Equipment Utilized During Treatment: Gait belt Activity Tolerance: Patient tolerated treatment well Patient left: in chair;with call bell/phone within reach;with chair alarm set Nurse Communication: Mobility status PT Visit Diagnosis: Unsteadiness on feet (R26.81);Difficulty in walking, not elsewhere classified (R26.2);History of falling (Z91.81)     Time: [id number] PT Time Calculation (min) (ACUTE ONLY): 27 min  Charges:  $Gait Training: 8-22 mins $Neuromuscular Re-education: 8-22 mins                     Kittie Plater, PT, DPT Acute Rehabilitation Services Pager #: [phone] Office #: [phone]    Berline Lopes 05/02/2019, 10:24 AM

## 2019-05-02 NOTE — Progress Notes (Signed)
CRITICAL VALUE ALERT  Critical Value:  5  Date & Time Notied:  05/02/2019 1730  Provider Notified: Hypoglycemia Protocol  Orders Received/Actions taken: Salli Real Given

## 2019-05-02 NOTE — Progress Notes (Signed)
  Speech Language Pathology Treatment: Dysphagia;Cognitive-Linquistic  Patient Details Name: Kyle [name] MRN: [id number] DOB: 24-Jul-1955 Today's Date: 05/02/2019 Time: [id number] SLP Time Calculation (min) (ACUTE ONLY): 43 min  Assessment / Plan / Recommendation Clinical Impression  Pt up in chair during dysphagia and speech intervention. Slight improvements re: mandibular and labial ROM,  however, nonfunctional presently. Increased saliva production and anterior spill with increased awareness as session progressed. Functional activity such as rubbing lips with chapstick elicited spontaneous labial closure. He was unable to contain ice which fell out of oral cavity. Applesauce remained on anterior tongue and removed by therapist. Visual feedback provided suing mirror. Encouraged him to continue to try closing lips, gathering and attempting to swallow his saliva. Recommend Cortrak for nutrition.   Today he exhibited increased difficulty initiating phonation despite multimodal cueing via automatic tasks. He was unable to imitate melodic pattern. Vocalization audible during yawn x 2. MRI showed chronic left infarct, chronic left infarct and bilateral encephalomalacia. He was able to communicate with gestures and writing.      HPI HPI: Weldon Erlinger is a 63 y.o. M with hx of CVA, sleep apnea, SOB, peripheral vascular disease, HPT, GERD, gastric bypass surgery(2013), atrial fibrillation, arthritis, tobacco use, and ETOH admitted 11/15 after friend found sitting in car, confused. Head CT Showed hyperdense R MCA bifurcation, no acute infarct and atrophy with chronic temporal infarct / chronic L frontal infarct. MRI showed R MCA restricuted diffusion primarily involving operculum. No associated hemorrhage or mass efect. Underlying bilateral MCA chronic encephalomalacia. Briefly intubated 11/15 for arteriogram. Recently admitted to ED 11/06 for fall with R ankle sprain and 11/08 for another fall, NR  reported pt is chronic opiate user due to chronic back pain. Diet currently NPO.       SLP Plan  Continue with current plan of care       Recommendations  Diet recommendations: NPO Medication Administration: Via alternative means                Oral Care Recommendations: Oral care QID Follow up Recommendations: Inpatient Rehab SLP Visit Diagnosis: Dysarthria and anarthria (R47.1);Cognitive communication deficit [id number]) Plan: Continue with current plan of care       GO                Houston Siren 05/02/2019, 11:48 AM  Orbie Pyo Colvin Caroli.Ed Risk analyst [phone] Office [phone]

## 2019-05-03 DIAGNOSIS — Z4659 Encounter for fitting and adjustment of other gastrointestinal appliance and device: Secondary | ICD-10-CM

## 2019-05-03 LAB — BASIC METABOLIC PANEL
Anion gap: 10 (ref 5–15)
BUN: 18 mg/dL (ref 8–23)
CO2: 23 mmol/L (ref 22–32)
Calcium: 8.9 mg/dL (ref 8.9–10.3)
Chloride: 104 mmol/L (ref 98–111)
Creatinine, Ser: 0.68 mg/dL (ref 0.61–1.24)
GFR calc Af Amer: >60 mL/min (ref >60)
GFR calc non Af Amer: >60 mL/min (ref >60)
Glucose, Bld: 103 mg/dL — ABNORMAL HIGH (ref 70–99)
Potassium: 3.8 mmol/L (ref 3.5–5.1)
Sodium: 137 mmol/L (ref 135–145)

## 2019-05-03 LAB — GLUCOSE, CAPILLARY
Glucose-Capillary: 100 mg/dL — ABNORMAL HIGH (ref 70–99)
Glucose-Capillary: 110 mg/dL — ABNORMAL HIGH (ref 70–99)
Glucose-Capillary: 111 mg/dL — ABNORMAL HIGH (ref 70–99)
Glucose-Capillary: 83 mg/dL (ref 70–99)
Glucose-Capillary: 86 mg/dL (ref 70–99)
Glucose-Capillary: 90 mg/dL (ref 70–99)

## 2019-05-03 LAB — CBC
HCT: 44.1 % (ref 39.0–52.0)
Hemoglobin: 14.5 g/dL (ref 13.0–17.0)
MCH: 27.7 pg (ref 26.0–34.0)
MCHC: 32.9 g/dL (ref 30.0–36.0)
MCV: 84.3 fL (ref 80.0–100.0)
Platelets: 194 10*3/uL (ref 150–400)
RBC: 5.23 MIL/uL (ref 4.22–5.81)
RDW: 15.1 % (ref 11.5–15.5)
WBC: 7.6 10*3/uL (ref 4.0–10.5)
nRBC: 0 % (ref 0.0–0.2)

## 2019-05-03 LAB — MAGNESIUM
Magnesium: 1.8 mg/dL (ref 1.7–2.4)
Magnesium: 1.9 mg/dL (ref 1.7–2.4)

## 2019-05-03 LAB — PHOSPHORUS
Phosphorus: 3.4 mg/dL (ref 2.5–4.6)
Phosphorus: 2.9 mg/dL (ref 2.5–4.6)

## 2019-05-03 MED ORDER — LISINOPRIL 10 MG PO TABS
10.0000 mg | ORAL_TABLET | Freq: Every day | ORAL | Status: DC
  Administered 2019-05-03 – 2019-05-04 (×2): 10 mg
  Filled 2019-05-03: qty 1

## 2019-05-03 NOTE — Progress Notes (Signed)
Physical Therapy Treatment Patient Details Name: Kyle [name] MRN: [id number] DOB: 03-May-1956 Today's Date: 05/03/2019    History of Present Illness Kyle [name] is an 63 y.o. male  With PMH a. Fib ( not anticoagulated) sick sinus syndrom, HTN, CVA, OSA ( not on CPAP) who presented to [facility] as a code stroke. Pt found to have R MCA acute ischemic stroke due to right M1 occlusion, pt s/p mechanical thrombectomy.    PT Comments    Pt remains very motivated and eager to participate. Having some increased difficulty maneuvering with air bed; utilized Stedy to stand with two person min-mod assist. Also demonstrating improvements in left sided strength. Will need post acute rehab to address deficits.     Follow Up Recommendations  CIR     Equipment Recommendations  Wheelchair (measurements PT);3in1 (PT);Wheelchair cushion (measurements PT);Other (comment)(bariatric)    Recommendations for Other Services       Precautions / Restrictions Precautions Precautions: Fall Restrictions Weight Bearing Restrictions: No    Mobility  Bed Mobility Overal bed mobility: Needs Assistance Bed Mobility: Supine to Sit     Supine to sit: Mod assist     General bed mobility comments: Pt able to bring both legs to edge of bed, modA to pull up to sit. Increased difficulty with reciprocal scooting (particularly scooting left hip forwards to edge of bed).  Transfers Overall transfer level: Needs assistance Equipment used: Ambulation equipment used Transfers: Sit to/from Stand Sit to Stand: Mod assist;+2 physical assistance;Min assist         General transfer comment: ModA + 2 to stand initially to [place], progressing to minA + 2 for second stand  Ambulation/Gait                 Stairs             Wheelchair Mobility    Modified Rankin (Stroke Patients Only) Modified Rankin (Stroke Patients Only) Pre-Morbid Rankin Score: No significant disability Modified Rankin:  Moderately severe disability     Balance Overall balance assessment: Needs assistance Sitting-balance support: Feet supported;No upper extremity supported Sitting balance-Leahy Scale: Fair Sitting balance - Comments: Able to functionally reach with supervision   Standing balance support: Bilateral upper extremity supported Standing balance-Leahy Scale: Poor Standing balance comment: dependent on RW or physical assist                            Cognition Arousal/Alertness: Awake/alert Behavior During Therapy: WFL for tasks assessed/performed Overall Cognitive Status: Impaired/Different from baseline Area of Impairment: Problem solving                       Following Commands: Follows one step commands consistently     Problem Solving: Slow processing;Difficulty sequencing;Requires verbal cues;Requires tactile cues General Comments: Use of writing to communicate; following all one step commands. Continued right gaze preference      Exercises General Exercises - Upper Extremity Shoulder Flexion: Left;10 reps;Supine Elbow Flexion: Left;10 reps;Supine General Exercises - Lower Extremity Long Arc Quad: Left;10 reps;Supine Heel Slides: Left;10 reps;Supine Other Exercises Other Exercises: Sitting on EOB: functional reaching with LUE    General Comments        Pertinent Vitals/Pain Pain Assessment: Faces Faces Pain Scale: No hurt    Home Living                      Prior Function  PT Goals (current goals can now be found in the care plan section) Acute Rehab PT Goals Potential to Achieve Goals: Good Progress towards PT goals: Progressing toward goals    Frequency    Min 4X/week      PT Plan Current plan remains appropriate    Co-evaluation              AM-PAC PT "6 Clicks" Mobility   Outcome Measure  Help needed turning from your back to your side while in a flat bed without using bedrails?: A Little Help  needed moving from lying on your back to sitting on the side of a flat bed without using bedrails?: A Lot Help needed moving to and from a bed to a chair (including a wheelchair)?: A Lot Help needed standing up from a chair using your arms (e.g., wheelchair or bedside chair)?: A Lot Help needed to walk in hospital room?: Total Help needed climbing 3-5 steps with a railing? : Total 6 Click Score: 11    End of Session Equipment Utilized During Treatment: Gait belt Activity Tolerance: Patient tolerated treatment well Patient left: in chair;with call bell/phone within reach;with chair alarm set Nurse Communication: Mobility status PT Visit Diagnosis: Unsteadiness on feet (R26.81);Difficulty in walking, not elsewhere classified (R26.2);History of falling (Z91.81)     Time: [id number] PT Time Calculation (min) (ACUTE ONLY): 37 min  Charges:  $Therapeutic Exercise: 8-22 mins $Therapeutic Activity: 8-22 mins                     Ellamae Sia, PT, DPT Acute Rehabilitation Services Pager [phone] Office [phone]    Willy Eddy 05/03/2019, 1:09 PM

## 2019-05-03 NOTE — Progress Notes (Signed)
Inpatient Rehab Admissions Coordinator:   Spoke to pt's emergency contact, [name].  He works out of town, frequently, and also is the caregiver for his wife, so unable to provide much support to pt following short term rehab.  He also mentioned that pt's mother has been deceased for over 35 years.  We discussed rehab at SNF level and [name] agreeable.  Will sign off at this time.   Shann Medal, PT, DPT Admissions Coordinator [phone] 05/03/19  10:14 AM

## 2019-05-03 NOTE — Care Management Important Message (Signed)
Important Message  Patient Details  Name: Kyle [name] MRN: [id number] Date of Birth: 1956/02/08   Medicare Important Message Given:        Kyle [name] 05/03/2019, 3:21 PM

## 2019-05-03 NOTE — NC FL2 (Addendum)
[place] MEDICAID FL2 LEVEL OF CARE SCREENING TOOL     IDENTIFICATION  Patient Name: Kyle [name] Birthdate: 1955/07/05 Sex: male Admission Date (Current Location): 04/30/2019  [facility] and [place] Number:  Herbalist and Address:  The . [facility], [place] [address], [place], [place] [address]      Provider Number: [id number]  Attending Physician Name and Address:  [name], MD  Relative Name and Phone Number:  [name], [phone]    Current Level of Care: Hospital Recommended Level of Care: [place] Prior Approval Number:    Date Approved/Denied:   PASRR Number: [id number]  Discharge Plan: SNF    Current Diagnoses: Patient Active Problem List   Diagnosis Date Noted  . Stroke ([place]) 04/30/2019  . Middle cerebral artery embolism, right 04/30/2019  . Chronic atrial fibrillation ([place]) 08/26/2015  . Hyperlipidemia LDL goal <100 06/26/2014  . Routine general medical examination at a health care facility 11/21/2013  . Screening PSA (prostate specific antigen) 11/21/2013  . Special screening for malignant neoplasms, colon 11/21/2013  . OSA (obstructive sleep apnea) 08/22/2013  . Morbid obesity ([place]) 04/19/2013  . Other emphysema ([place]) 01/31/2013  . Essential hypertension 12/21/2012  . Insomnia 12/21/2012  . GERD (gastroesophageal reflux disease) 09/20/2012  . Back pain, chronic 09/20/2012  . Obesity hypoventilation syndrome ([place]) 09/20/2012  . Adjustment disorder with mixed anxiety and depressed mood 09/20/2012    Orientation RESPIRATION BLADDER Height & Weight     (Unable to Assess)  Normal Indwelling catheter Weight: (unable to access; bed weight not working) Height:  6\' 3"  (190.5 cm)  BEHAVIORAL SYMPTOMS/MOOD NEUROLOGICAL BOWEL NUTRITION STATUS      Incontinent Feeding tube  AMBULATORY STATUS COMMUNICATION OF NEEDS Skin   Extensive Assist Does not communicate Skin abrasions(MASD, R/L Buttock, Abdomen,  Chest)                       Personal Care Assistance Level of Assistance  Bathing, Feeding, Dressing Bathing Assistance: Maximum assistance Feeding assistance: Maximum assistance Dressing Assistance: Maximum assistance     Functional Limitations Info  Speech     Speech Info: Impaired(Expressive Aphasia)    SPECIAL CARE FACTORS FREQUENCY  PT (By licensed PT), OT (By licensed OT), Speech therapy     PT Frequency: 5x/week OT Frequency: 5x/week     Speech Therapy Frequency: 5x/wk      Contractures Contractures Info: Not present    Additional Factors Info  Code Status, Allergies, Insulin Sliding Scale Code Status Info: Full Allergies Info: Vioxx (Rofecoxib)   Insulin Sliding Scale Info: 0-9 units, every 4 hours       Current Medications (05/03/2019):  This is the current hospital active medication list Current Facility-Administered Medications  Medication Dose Route Frequency Provider Last Rate Last Dose  .  stroke: mapping our early stages of recovery book   Does not apply Once Vonzella Nipple, NP      . 0.9 %  sodium chloride infusion   Intravenous Continuous [name], MD   Stopped at 05/02/19 1701  . acetaminophen (TYLENOL) tablet 650 mg  650 mg Per Tube Q4H PRN [name], MD       Or  . acetaminophen (TYLENOL) 160 MG/5ML solution 650 mg  650 mg Per Tube Q4H PRN [name], MD       Or  . acetaminophen (TYLENOL) suppository 650 mg  650 mg Rectal Q4H PRN [name], MD      .  albuterol (PROVENTIL) (2.5 MG/3ML) 0.083% nebulizer solution 3 mL  3 mL Inhalation Q4H PRN [name], MD      . amitriptyline (ELAVIL) tablet 50 mg  50 mg Per Tube QHS [name], MD   50 mg at 05/02/19 2100  . aspirin tablet 325 mg  325 mg Per Tube Daily [name], MD      . atorvastatin (LIPITOR) tablet 40 mg  40 mg Per Tube [id number] [name], MD   40 mg at 05/02/19 1735  . chlorhexidine (PERIDEX) 0.12 % solution 15 mL  15 mL Mouth Rinse BID Aroor, Lanice Schwab, MD   15 mL  at 05/02/19 2100  . Chlorhexidine Gluconate Cloth 2 % PADS 6 each  6 each Topical Daily Aroor, Lanice Schwab, MD   6 each at 05/02/19 1000  . feeding supplement (JEVITY 1.2 CAL) liquid 1,000 mL  1,000 mL Per Tube Continuous [name], MD 45 mL/hr at 05/03/19 0345    . feeding supplement (PRO-STAT SUGAR FREE 64) liquid 30 mL  30 mL Per Tube TID [name], MD   30 mL at 05/02/19 2101  . gabapentin (NEURONTIN) 250 MG/5ML solution 600 mg  600 mg Per Tube QID [name], MD   600 mg at 05/02/19 2104  . insulin aspart (novoLOG) injection 0-9 Units  0-9 Units Subcutaneous Q4H [name], MD      . labetalol (NORMODYNE) injection 10-20 mg  10-20 mg Intravenous Q2H PRN [name], MD   10 mg at 05/02/19 2058  . lidocaine (PF) (XYLOCAINE) 1 % injection 5 mL  5 mL Other Once [name], MD      . lisinopril (ZESTRIL) tablet 10 mg  10 mg Oral Daily [name], MD   10 mg at 05/02/19 1540  . MEDLINE mouth rinse  15 mL Mouth Rinse q12n4p Aroor, Karena Addison R, MD   15 mL at 05/02/19 1544  . metoprolol tartrate (LOPRESSOR) tablet 25 mg  25 mg Per Tube BID [name], MD   25 mg at 05/02/19 2102  . ondansetron (ZOFRAN) injection 4 mg  4 mg Intravenous Q6H PRN Deveshwar, Sanjeev, MD      . oxyCODONE (Oxy IR/ROXICODONE) immediate release tablet 5 mg  5 mg Per Tube QID PRN [name], MD   5 mg at 05/02/19 2100  . pantoprazole sodium (PROTONIX) 40 mg/20 mL oral suspension 40 mg  40 mg Per Tube Daily [name], MD   40 mg at 05/02/19 1540  . PARoxetine (PAXIL) tablet 10 mg  10 mg Per Tube Daily [name], MD      . senna-docusate (Senokot-S) tablet 1 tablet  1 tablet Per Tube QHS PRN [name], MD         Discharge Medications: Please see discharge summary for a list of discharge medications.  Relevant Imaging Results:  Relevant Lab Results:   Additional Information SS#: [id number]  Kirstie Peri, Student-Social Work

## 2019-05-03 NOTE — Progress Notes (Signed)
STROKE TEAM PROGRESS NOTE   INTERVAL HISTORY Pt lying in bed, no acute event overnight. Left arm and leg strength continues to improve. Still anarthric. On TF. PT/OT now recommend SNF. May need PEG.   Vitals:   05/02/19 2112 05/03/19 0003 05/03/19 0327 05/03/19 0835  BP: (!) 158/85 (!) 146/75 (!) 144/97 (!) 164/86  Pulse:  (!) 51 (!) 45 62  Resp:  _0 Temp:  97.7 F (36.5 C) (!) 97.5 F (36.4 C) 98 F (36.7 C)  TempSrc:  Oral Oral Oral  SpO2:  96% 100% 100%  Weight:      Height:        CBC:  Recent Labs  Lab 04/30/19 1152  05/01/19 0630 05/02/19 0542 05/03/19 0500  WBC 9.2  --  11.3* 6.8 7.6  NEUTROABS 7.0  --  9.2*  --   --   HGB 15.5   < > 15.4 13.9 14.5  HCT 48.0   < > 46.5 42.9 44.1  MCV 85.6  --  84.2 83.8 84.3  PLT 241  --  157 190 194   < > = values in this interval not displayed.    Basic Metabolic Panel:  Recent Labs  Lab 05/02/19 0542  05/02/19 1724 05/03/19 0500  NA 138  --   --  137  K 3.2*  --   --  3.8  CL 101  --   --  104  CO2 23  --   --  23  GLUCOSE 93  --   --  103*  BUN 12  --   --  18  CREATININE 0.76  --   --  0.68  CALCIUM 8.9  --   --  8.9  MG  --    < > 1.6* 1.8  PHOS  --    < > 3.1 3.4   < > = values in this interval not displayed.   Lipid Panel:     Component Value Date/Time   CHOL 102 05/01/2019 0630   CHOL 142 12/19/2014 0804   TRIG 98 05/01/2019 0630   HDL 25 (L) 05/01/2019 0630   HDL 29 (L) 12/19/2014 0804   CHOLHDL 4.1 05/01/2019 0630   VLDL 20 05/01/2019 0630   LDLCALC 57 05/01/2019 0630   LDLCALC 93 12/19/2014 0804   HgbA1c:  Lab Results  Component Value Date   HGBA1C 5.5 05/01/2019   Urine Drug Screen:     Component Value Date/Time   LABOPIA NONE DETECTED 04/30/2019 1507   COCAINSCRNUR NONE DETECTED 04/30/2019 1507   COCAINSCRNUR Negative 03/15/2015 1141   LABBENZ NONE DETECTED 04/30/2019 1507   AMPHETMU POSITIVE (A) 04/30/2019 1507   THCU NONE DETECTED 04/30/2019 1507   LABBARB NONE DETECTED  04/30/2019 1507    Alcohol Level     Component Value Date/Time   ETH <10 04/30/2019 1152    IMAGING Dg Abd 1 View  Result Date: 05/02/2019 CLINICAL DATA:  NG tube placement, history of gastric bypass EXAM: ABDOMEN - 1 VIEW; DG NASO G TUBE PLC W/FL-NO RAD CONTRAST:  14m OMNIPAQUE IOHEXOL 300 MG/ML  SOLN FLUOROSCOPY TIME:  Fluoroscopy Time:  5 minutes 12 seconds Number of Acquired Spot Images: 1 COMPARISON:  None. FINDINGS: Enteric tube is not well seen but injected contrast opacifies the lumen of the stomach. IMPRESSION: Enteric tube placement within the stomach. Electronically Signed   By: PMacy MisM.D.   On: 05/02/2019 15:31   Dg NAddison BaileyG Tube Plc W/fl-no  Rad  Result Date: 05/02/2019 CLINICAL DATA:  NG tube placement, history of gastric bypass EXAM: ABDOMEN - 1 VIEW; DG NASO G TUBE PLC W/FL-NO RAD CONTRAST:  90m OMNIPAQUE IOHEXOL 300 MG/ML  SOLN FLUOROSCOPY TIME:  Fluoroscopy Time:  5 minutes 12 seconds Number of Acquired Spot Images: 1 COMPARISON:  None. FINDINGS: Enteric tube is not well seen but injected contrast opacifies the lumen of the stomach. IMPRESSION: Enteric tube placement within the stomach. Electronically Signed   By: PMacy MisM.D.   On: 05/02/2019 15:31   Cerebral angiogram 05/01/2019 S/P complete revascularization of occluded RT MCA M1 seg with x 1 pass with SolitaireX 452mx 40 mm ret river ,and aspiration achieving a TICI 2c revascularization.  2D Echocardiogram  1. Left ventricular ejection fraction, by visual estimation, is 60 to 65%. The left ventricle has normal function. There is moderately increased left ventricular hypertrophy.  2. Left ventricular diastolic function could not be evaluated.  3. Global right ventricle has normal systolic function.The right ventricular size is normal. No increase in right ventricular wall thickness.  4. Left atrial size was severely dilated.  5. Right atrial size was mildly dilated.  6. Mild to moderate mitral annular  calcification.  7. The mitral valve is grossly normal. No evidence of mitral valve regurgitation.  8. The tricuspid valve is grossly normal. Tricuspid valve regurgitation is trivial.  9. The aortic valve is tricuspid. Aortic valve regurgitation is mild. Mild aortic valve sclerosis without stenosis. 10. The pulmonic valve was not well visualized. Pulmonic valve regurgitation is not visualized.   PHYSICAL EXAM     Temp:  [97.5 F (36.4 C)-98.6 F (37 C)] 98 F (36.7 C) (11/18 0835) Pulse Rate:  [45-121] 62 (11/18 0835) Resp:  [11-20] 16 (11/18 0835) BP: (127-184)/(72-114) 164/86 (11/18 0835) SpO2:  [74 %-100 %] 100 % (11/18 0835)  General - morbid obesity, well developed, not in acute distress.  Ophthalmologic - fundi not visualized due to noncooperation.  Cardiovascular - irregularly irregular heart rate and rhythm.  Neuro - awake alert, still anarthric, not able to answer orientation questions. Follows all simple commands, not able to name or repeat, seems to make right sounds when ask to repeat "I am here". PERRL, right gaze full but incomplete left gaze, blinking to visual threat bilaterally. Bilateral facial weakness but left seems more than right. Tongue protrusion difficulty. RUE 5/5, LUE 4+/5 proximal and 4/5 finger grip. RLE 5/5. LLE 4+/5 proximal and 5-/5 distal. DTR 1+ and no babinski. Sensation symmetrical. Bilateral FTN intact. Gait not tested but as per PT, pt took steps from bed to chair.    ASSESSMENT/PLAN Mr. StCARRSON LIGHTCAPs a [age].o. male with history of a. Fib ( not anticoagulated) sick sinus syndrom, HTN, CVA, OSA ( not on CPAP) presenting with right side gaze and left arm paralysis.   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 Probably hyperdense R MCA bifurcation. Old R temporal lobe and L frontal lobe infarcts. .     CTA head &  neck suboptimal. Occlusion R MCA at bifurcation.  CT perfusion large delayed perfusion R MCA. No core infarct.   Cerebral angio occluded R M1, TICI2c   MRI  R MCA infarct (operculum). B MCA encephalomalacia  MRA  Improved R MCA patency  2D Echo EF 60-65%. No source of  embolus   LDL 57   HgbA1c 5.5   HIV neg  Lovenox 40 mg sq daily for VTE prophylaxis  aspirin 81 mg daily prior to admission, now on aspirin 325  daily. Consider IV heparin tomorrow given possible PEG placement.   Therapy recommendations:  CIR->family prefer SNF for longer term care. SW now involved   Disposition:  pending   Atrial Fibrillation  Home anticoagulation:  not taking   Started on Xarelto 09/15/2018, documented not taking 04/23/2019 (family practice note)  Now on ASA PR . Consider IV heparin tomorrow given possible PEG placement.    Hypertension  Home meds:  Lisinopril 20 . Treated w/ cleviprex, now off. Labetalol prn  . BP goal < 180 . BP stable . Put on lisinopril 10 and added metoprolol 25 bid   . Long-term BP goal normotensive  Hyperlipidemia  Home meds:  Prescribed lipitor - ? taking  LDL 57, goal < 70  Now on lipitor 40  Continue statin at discharge  Dysphagia . Secondary to stroke . NPO . Speech on board . Has cortrak and TF . May need PEG prior to SNF placement  Tobacco abuse  Current smoker  Smoking cessation counseling provided  Pt is willing to quit   Other Stroke Risk Factors  ETOH use, alcohol level <10, advised to drink no more than 2 drink(s) a day  Substance abuse UDS:  Amphetamines  POSITIVE  Morbid Obesity, Body mass index is 43.37 kg/m., hx failed open gastroplasty in 1993, recommend weight loss, diet and exercise as appropriate   Hx stroke/TIA by imaging - Old R temporal lobe and L frontal lobe infarcts - likely due to AFib  Obstructive sleep apnea, not on CPAP at home  Other Active Problems  Leukocytosis WBC 9.2->  11.3->6.8->7.6  COPD  Hypokalemia 3.2 - supplement - 3.8    Hospital day # 3  [name], MD PhD Stroke Neurology 05/03/2019 11:09 AM   To contact Stroke Continuity provider, please refer to [email or web address] After hours, contact General Neurology

## 2019-05-03 NOTE — Progress Notes (Signed)
  Speech Language Pathology Treatment: Dysphagia  Patient Details Name: Kyle [name] MRN: [name] DOB: 05/18/1956 Today's Date: 05/03/2019 Time: 1525-1550 SLP Time Calculation (min) (ACUTE ONLY): 25 min  Assessment / Plan / Recommendation Clinical Impression  Pt seen at bedside for skilled ST intervention targeting goals for readiness for po intake, communication, and attention to the left. Pt assisted with oral care, but allowed SLP to assist for thorough cleaning and removal of secretions/oral rinse. Pt able to cough on command, which is intermittently productive. Pt benefits from multimodal cues for lip closure. Min lingual protrusion noted. Pt was given individual ice chips following oral care. No manipulation of ice chip was observed, with oral holding of melted ice with one presentation, and anterior leakage on the other. Pt was able to vocalize with visual and verbal cues to take a deep breath and sigh loudly upon exhale. Vocal quality was harsh. Pt required max multimodal cues to attend to left of midline during this session, with right gaze preference noted. SLP will continue acutely with focus on current plan of care. Continued need for ST following acute stay is anticipated.    HPI HPI: Kyle [name] is a 63 y.o. M with hx of CVA, sleep apnea, SOB, peripheral vascular disease, HPT, GERD, gastric bypass surgery(2013), atrial fibrillation, arthritis, tobacco use, and ETOH admitted 11/15 after friend found sitting in car, confused. Head CT Showed hyperdense R MCA bifurcation, no acute infarct and atrophy with chronic temporal infarct / chronic L frontal infarct. MRI showed R MCA restricuted diffusion primarily involving operculum. No associated hemorrhage or mass efect. Underlying bilateral MCA chronic encephalomalacia. Briefly intubated 11/15 for arteriogram. Recently admitted to ED 11/06 for fall with R ankle sprain and 11/08 for another fall, NR reported pt is chronic opiate user due to  chronic back pain. Diet currently NPO.       SLP Plan  Continue with current plan of care       Recommendations  Diet recommendations: NPO Medication Administration: Via alternative means                Oral Care Recommendations: Oral care QID Follow up Recommendations: Inpatient Rehab SLP Visit Diagnosis: Dysarthria and anarthria (R47.1);Cognitive communication deficit (R41.841);Apraxia (R48.2) Plan: Continue with current plan of care       [place], [facility], [place] Pathologist Office: [phone] Pager: [phone]  Shonna Chock 05/03/2019, 3:58 PM

## 2019-05-03 NOTE — Progress Notes (Signed)
Physical Therapy Treatment Patient Details Name: Kyle [name] MRN: [name] DOB: January 09, 1956 Today's Date: 05/03/2019    History of Present Illness Kyle [name] is an 63 y.o. male  With PMH a. Fib ( not anticoagulated) sick sinus syndrom, HTN, CVA, OSA ( not on CPAP) who presented to [facility] as a code stroke. Pt found to have R MCA acute ischemic stroke due to right M1 occlusion, pt s/p mechanical thrombectomy.    PT Comments    Additional session to assist RN with pt transfer back to chair. Pt requiring two person moderate assist to stand with use of St[place]. Will continue to progress as tolerated.  Follow Up Recommendations  SNF     Equipment Recommendations  Wheelchair (measurements PT);3in1 (PT);Wheelchair cushion (measurements PT);Other (comment)(bariatric)    Recommendations for Other Services       Precautions / Restrictions Precautions Precautions: Fall Restrictions Weight Bearing Restrictions: No    Mobility  Bed Mobility Overal bed mobility: Needs Assistance Bed Mobility: Sit to Supine     Supine to sit: Mod assist     General bed mobility comments: Guidance for trunk and LLE negotiation back into bed  Transfers Overall transfer level: Needs assistance Equipment used: Ambulation equipment used Transfers: Sit to/from Stand Sit to Stand: Mod assist;+2 physical assistance         General transfer comment: ModA + 2 to stand to [place], cues for initiation  Ambulation/Gait                 Stairs             Wheelchair Mobility    Modified Rankin (Stroke Patients Only) Modified Rankin (Stroke Patients Only) Pre-Morbid Rankin Score: No significant disability Modified Rankin: Moderately severe disability     Balance Overall balance assessment: Needs assistance Sitting-balance support: Feet supported;No upper extremity supported Sitting balance-Leahy Scale: Fair Sitting balance - Comments: Able to functionally reach with supervision   Standing balance support: Bilateral upper extremity supported Standing balance-Leahy Scale: Poor Standing balance comment: dependent on RW or physical assist                            Cognition Arousal/Alertness: Awake/alert Behavior During Therapy: WFL for tasks assessed/performed Overall Cognitive Status: Impaired/Different from baseline Area of Impairment: Problem solving                       Following Commands: Follows one step commands consistently     Problem Solving: Slow processing;Difficulty sequencing;Requires verbal cues;Requires tactile cues General Comments: Use of writing to communicate; following all one step commands. Continued right gaze preference      Exercises     General Comments        Pertinent Vitals/Pain Pain Assessment: Faces Faces Pain Scale: No hurt Pain Location: back Pain Descriptors / Indicators: Discomfort;Grimacing Pain Intervention(s): Limited activity within patient's tolerance;Monitored during session;Repositioned    Home Living                      Prior Function            PT Goals (current goals can now be found in the care plan section) Acute Rehab PT Goals Potential to Achieve Goals: Good Progress towards PT goals: Progressing toward goals    Frequency    Min 3X/week      PT Plan Discharge plan needs to be updated;Frequency needs to be updated  Co-evaluation              AM-PAC PT "6 Clicks" Mobility   Outcome Measure  Help needed turning from your back to your side while in a flat bed without using bedrails?: A Little Help needed moving from lying on your back to sitting on the side of a flat bed without using bedrails?: A Lot Help needed moving to and from a bed to a chair (including a wheelchair)?: A Lot Help needed standing up from a chair using your arms (e.g., wheelchair or bedside chair)?: A Lot Help needed to walk in hospital room?: Total Help needed climbing 3-5  steps with a railing? : Total 6 Click Score: 11    End of Session Equipment Utilized During Treatment: Gait belt Activity Tolerance: Patient tolerated treatment well Patient left: with call bell/phone within reach;in bed;with nursing/sitter in room Nurse Communication: Mobility status PT Visit Diagnosis: Unsteadiness on feet (R26.81);Difficulty in walking, not elsewhere classified (R26.2);History of falling (Z91.81)     Time: [id number] PT Time Calculation (min) (ACUTE ONLY): 8 min  Charges:  $Therapeutic Activity: 8-22 mins                    Ellamae Sia, PT, DPT Acute Rehabilitation Services Pager [phone] Office [phone]    Willy Eddy 05/03/2019, 4:00 PM

## 2019-05-04 DIAGNOSIS — R471 Dysarthria and anarthria: Secondary | ICD-10-CM

## 2019-05-04 LAB — CBC
HCT: 45 % (ref 39.0–52.0)
Hemoglobin: 14.3 g/dL (ref 13.0–17.0)
MCH: 27.2 pg (ref 26.0–34.0)
MCHC: 31.8 g/dL (ref 30.0–36.0)
MCV: 85.7 fL (ref 80.0–100.0)
Platelets: 214 10*3/uL (ref 150–400)
RBC: 5.25 MIL/uL (ref 4.22–5.81)
RDW: 15.3 % (ref 11.5–15.5)
WBC: 8 10*3/uL (ref 4.0–10.5)
nRBC: 0 % (ref 0.0–0.2)

## 2019-05-04 LAB — GLUCOSE, CAPILLARY
Glucose-Capillary: 103 mg/dL — ABNORMAL HIGH (ref 70–99)
Glucose-Capillary: 110 mg/dL — ABNORMAL HIGH (ref 70–99)
Glucose-Capillary: 110 mg/dL — ABNORMAL HIGH (ref 70–99)
Glucose-Capillary: 116 mg/dL — ABNORMAL HIGH (ref 70–99)
Glucose-Capillary: 68 mg/dL — ABNORMAL LOW (ref 70–99)
Glucose-Capillary: 79 mg/dL (ref 70–99)
Glucose-Capillary: 87 mg/dL (ref 70–99)
Glucose-Capillary: 88 mg/dL (ref 70–99)

## 2019-05-04 LAB — BASIC METABOLIC PANEL
Anion gap: 11 (ref 5–15)
BUN: 22 mg/dL (ref 8–23)
CO2: 26 mmol/L (ref 22–32)
Calcium: 8.9 mg/dL (ref 8.9–10.3)
Chloride: 100 mmol/L (ref 98–111)
Creatinine, Ser: 0.69 mg/dL (ref 0.61–1.24)
GFR calc Af Amer: >60 mL/min (ref >60)
GFR calc non Af Amer: >60 mL/min (ref >60)
Glucose, Bld: 104 mg/dL — ABNORMAL HIGH (ref 70–99)
Potassium: 3.7 mmol/L (ref 3.5–5.1)
Sodium: 137 mmol/L (ref 135–145)

## 2019-05-04 MED ORDER — LISINOPRIL 10 MG PO TABS
10.0000 mg | ORAL_TABLET | Freq: Two times a day (BID) | ORAL | Status: DC
  Administered 2019-05-04 – 2019-05-15 (×21): 10 mg
  Filled 2019-05-04 (×21): qty 1

## 2019-05-04 MED ORDER — METOPROLOL TARTRATE 50 MG PO TABS: 50.0000 mg | ORAL_TABLET | Freq: Two times a day (BID) | ORAL | Status: DC

## 2019-05-04 MED ORDER — LISINOPRIL 20 MG PO TABS: 20.0000 mg | ORAL_TABLET | Freq: Every day | ORAL | Status: DC

## 2019-05-04 MED ORDER — METOPROLOL TARTRATE 25 MG PO TABS
25.0000 mg | ORAL_TABLET | Freq: Two times a day (BID) | ORAL | Status: DC
  Administered 2019-05-04 – 2019-05-06 (×4): 25 mg
  Filled 2019-05-04 (×4): qty 1

## 2019-05-04 MED ORDER — HEPARIN (PORCINE) 25000 UT/250ML-% IV SOLN
1800.0000 [IU]/h | INTRAVENOUS | Status: AC
  Administered 2019-05-04: 1500 [IU]/h via INTRAVENOUS
  Administered 2019-05-05: 1900 [IU]/h via INTRAVENOUS
  Administered 2019-05-07 – 2019-05-08 (×3): 1800 [IU]/h via INTRAVENOUS
  Filled 2019-05-04 (×7): qty 250

## 2019-05-04 NOTE — Progress Notes (Signed)
STROKE TEAM PROGRESS NOTE   INTERVAL HISTORY Pt lying in bed, still has anarthria and dysphagia. Need frequent suctioning for oral secretions. Moving extremities well. Likely need PEG for SNF placement. However, he has gastroplasty in the past, will need consult general surgery.    Vitals:   05/04/19 0323 05/04/19 0500 05/04/19 0732 05/04/19 1152  BP: (!) 156/93  (!) 152/79 (!) 162/85  Pulse: 72  64 (!) 56  Resp: 18 12 16 16   Temp: 98.7 F (37.1 C)  97.9 F (36.6 C) 97.8 F (36.6 C)  TempSrc: Axillary  Oral Oral  SpO2: 98%  100% 98%  Weight:      Height:        CBC:  Recent Labs  Lab 04/30/19 1152  05/01/19 0630  05/03/19 0500 05/04/19 0451  WBC 9.2  --  11.3*   < > 7.6 8.0  NEUTROABS 7.0  --  9.2*  --   --   --   HGB 15.5   < > 15.4   < > 14.5 14.3  HCT 48.0   < > 46.5   < > 44.1 45.0  MCV 85.6  --  84.2   < > 84.3 85.7  PLT 241  --  157   < > 194 214   < > = values in this interval not displayed.   Basic Metabolic Panel:  Recent Labs  Lab 05/03/19 0500 05/03/19 1646 05/04/19 0451  NA 137  --  137  K 3.8  --  3.7  CL 104  --  100  CO2 23  --  26  GLUCOSE 103*  --  104*  BUN 18  --  22  CREATININE 0.68  --  0.69  CALCIUM 8.9  --  8.9  MG 1.8 1.9  --   PHOS 3.4 2.9  --    Lipid Panel:     Component Value Date/Time   CHOL 102 05/01/2019 0630   CHOL 142 12/19/2014 0804   TRIG 98 05/01/2019 0630   HDL 25 (L) 05/01/2019 0630   HDL 29 (L) 12/19/2014 0804   CHOLHDL 4.1 05/01/2019 0630   VLDL 20 05/01/2019 0630   LDLCALC 57 05/01/2019 0630   LDLCALC 93 12/19/2014 0804   HgbA1c:  Lab Results  Component Value Date   HGBA1C 5.5 05/01/2019   Urine Drug Screen:     Component Value Date/Time   LABOPIA NONE DETECTED 04/30/2019 1507   COCAINSCRNUR NONE DETECTED 04/30/2019 1507   COCAINSCRNUR Negative 03/15/2015 1141   LABBENZ NONE DETECTED 04/30/2019 1507   AMPHETMU POSITIVE (A) 04/30/2019 1507   THCU NONE DETECTED 04/30/2019 1507   LABBARB NONE DETECTED  04/30/2019 1507    Alcohol Level     Component Value Date/Time   ETH <10 04/30/2019 1152    IMAGING Dg Abd 1 View  Result Date: 05/02/2019 CLINICAL DATA:  NG tube placement, history of gastric bypass EXAM: ABDOMEN - 1 VIEW; DG NASO G TUBE PLC W/FL-NO RAD CONTRAST:  40m OMNIPAQUE IOHEXOL 300 MG/ML  SOLN FLUOROSCOPY TIME:  Fluoroscopy Time:  5 minutes 12 seconds Number of Acquired Spot Images: 1 COMPARISON:  None. FINDINGS: Enteric tube is not well seen but injected contrast opacifies the lumen of the stomach. IMPRESSION: Enteric tube placement within the stomach. Electronically Signed   By: PMacy MisM.D.   On: 05/02/2019 15:31   Dg NAddison BaileyG Tube Plc W/fl-no Rad  Result Date: 05/02/2019 CLINICAL DATA:  NG tube placement, history of gastric  bypass EXAM: ABDOMEN - 1 VIEW; DG NASO G TUBE PLC W/FL-NO RAD CONTRAST:  70m OMNIPAQUE IOHEXOL 300 MG/ML  SOLN FLUOROSCOPY TIME:  Fluoroscopy Time:  5 minutes 12 seconds Number of Acquired Spot Images: 1 COMPARISON:  None. FINDINGS: Enteric tube is not well seen but injected contrast opacifies the lumen of the stomach. IMPRESSION: Enteric tube placement within the stomach. Electronically Signed   By: PMacy MisM.D.   On: 05/02/2019 15:31   Cerebral angiogram 05/01/2019 S/P complete revascularization of occluded RT MCA M1 seg with x 1 pass with SolitaireX 466mx 40 mm ret river ,and aspiration achieving a TICI 2c revascularization.  2D Echocardiogram  1. Left ventricular ejection fraction, by visual estimation, is 60 to 65%. The left ventricle has normal function. There is moderately increased left ventricular hypertrophy.  2. Left ventricular diastolic function could not be evaluated.  3. Global right ventricle has normal systolic function.The right ventricular size is normal. No increase in right ventricular wall thickness.  4. Left atrial size was severely dilated.  5. Right atrial size was mildly dilated.  6. Mild to moderate mitral annular  calcification.  7. The mitral valve is grossly normal. No evidence of mitral valve regurgitation.  8. The tricuspid valve is grossly normal. Tricuspid valve regurgitation is trivial.  9. The aortic valve is tricuspid. Aortic valve regurgitation is mild. Mild aortic valve sclerosis without stenosis. 10. The pulmonic valve was not well visualized. Pulmonic valve regurgitation is not visualized.   PHYSICAL EXAM  Temp:  [97.6 F (36.4 C)-98.7 F (37.1 C)] 97.8 F (36.6 C) (11/19 1152) Pulse Rate:  [54-72] 56 (11/19 1152) Resp:  [12-18] 16 (11/19 1152) BP: (147-163)/(70-93) 162/85 (11/19 1152) SpO2:  [97 %-100 %] 98 % (11/19 1152)  General - morbid obesity, well developed, not in acute distress.  Ophthalmologic - fundi not visualized due to noncooperation.  Cardiovascular - irregularly irregular heart rate and rhythm.  Neuro - awake alert, still anarthric, not able to answer orientation questions. Follows all simple commands, not able to name or repeat, seems to make right sounds when ask to repeat "I am here". PERRL, right gaze full but incomplete left gaze, blinking to visual threat bilaterally. Bilateral facial weakness but left seems more than right. Tongue protrusion difficulty. RUE 5/5, LUE 4+/5 proximal and 4/5 finger grip. RLE 5/5. LLE 4+/5 proximal and 5-/5 distal. DTR 1+ and no babinski. Sensation symmetrical. Bilateral FTN intact. Gait not tested.    ASSESSMENT/PLAN Kyle [name] a 89.o. male with history of a. Fib ( not anticoagulated) sick sinus syndrom, HTN, CVA, OSA ( not on CPAP) presenting with right side gaze and left arm paralysis.   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 Probably hyperdense R MCA bifurcation. Old R temporal lobe and L frontal lobe infarcts. .     CTA head & neck suboptimal. Occlusion R MCA at  bifurcation.  CT perfusion large delayed perfusion R MCA. No core infarct.   Cerebral angio occluded R M1, TICI2c   MRI  R MCA infarct (operculum). B MCA encephalomalacia  MRA  Improved R MCA patency  2D Echo EF 60-65%. No source of embolus   LDL 57   HgbA1c 5.5   HIV neg  Lovenox 40 mg sq daily for VTE prophylaxis  aspirin 81 mg daily prior  to admission, now on aspirin 325  daily. Will start IV heparin today given possible PEG placement next week.   Therapy recommendations:  CIR->family prefer SNF for longer term care.  Disposition:  Pending.  Bed obtained. Need to address swallow proir to d.c   Atrial Fibrillation  Home anticoagulation:  not taking   Started on Xarelto 09/15/2018, documented not taking 04/23/2019 (family practice note)  Now on ASA PR . Switch to IV heparin today given possible PEG placement.    Hypertension  Home meds:  Lisinopril 20 . Treated w/ cleviprex, now off. Labetalol prn  . BP goal < 180 . BP stable on the higher end . Increase lisinopril 10 to bid and continue metoprolol 25 bid   . Long-term BP goal normotensive  Hyperlipidemia  Home meds:  Prescribed lipitor - ? taking  LDL 57, goal < 70  Now on lipitor 40  Continue statin at discharge  Dysphagia . Secondary to stroke . NPO . Speech on board . Has cortrak and TF . Trauma / IR cannot place PEG d/t gastroplasty in 1993. They will contact general surgery to see if there are any open options. They will let us know.  . Need to address swallow plan prior to SNF placement  Tobacco abuse  Current smoker  Smoking cessation counseling provided  Pt is willing to quit   Other Stroke Risk Factors  ETOH use, alcohol level <10, advised to drink no more than 2 drink(s) a day  Substance abuse UDS:  Amphetamines  POSITIVE  Morbid Obesity, Body mass index is 43.37 kg/m., hx failed open gastroplasty in 1993, recommend weight loss, diet and exercise as appropriate   Hx stroke/TIA by  imaging - Old R temporal lobe and L frontal lobe infarcts - likely due to AFib  Obstructive sleep apnea, not on CPAP at home  Other Active Problems  Leukocytosis, resolved. WBC 9.2-> 11.3->6.8->7.6->8.0  COPD  Hypokalemia 3.2 - supplement - 3.8 - 3.7    Hospital day # 4  [name], MD PhD Stroke Neurology 05/04/2019 12:44 PM   To contact Stroke Continuity provider, please refer to [email or web address] After hours, contact General Neurology

## 2019-05-04 NOTE — Progress Notes (Signed)
ANTICOAGULATION CONSULT NOTE - Initial Consult  Pharmacy Consult for heparin Indication: atrial fibrillation and stroke  Allergies  Allergen Reactions  . Vioxx [Rofecoxib] Rash    Patient Measurements: Height: 6\' 3"  (190.5 cm) Weight: (!) 347 lb 0.1 oz (157.4 kg) IBW/kg (Calculated) : 84.5 Heparin Dosing Weight: 121 kg  Vital Signs: Temp: 98.3 F (36.8 C) (11/19 1609) Temp Source: Oral (11/19 1609) BP: 173/92 (11/19 1609) Pulse Rate: 59 (11/19 1609)  Labs: Recent Labs    05/02/19 0542 05/03/19 0500 05/04/19 0451  HGB 13.9 14.5 14.3  HCT 42.9 44.1 45.0  PLT 190 194 214  CREATININE 0.76 0.68 0.69    Estimated Creatinine Clearance: 155.9 mL/min (by C-G formula based on SCr of 0.69 mg/dL).  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       Points Metrics  0 Has Congestive Heart Failure:  No    Current as of about an hour ago  0 Has Vascular Disease:  No    Current as of about an hour ago  1 Has Hypertension:  Yes    Current as of about an hour ago  0 Age:  65    Current as of about an hour ago  0 Has Diabetes:  No    Current as of about an hour ago  2 Had Stroke:  Yes  Had TIA:  No  Had thromboembolism:  No    Current as of about an hour ago  0 Male:  No    Current as of about an hour ago    CBC stable  Goal of Therapy:  Heparin level 0.3 - 0.5 units/ml Monitor platelets by anticoagulation protocol: Yes   Plan:  Heparin gtt 1500 units/hr (no bolus d/t stroke) Initial hep lvl 0200 Daily HL  CBC F/U plans for possible PEG, conversion to oral [facility]  Barth Kirks, PharmD, BCPS, BCCCP Clinical Pharmacist [phone]  Please check AMION for all [place] numbers  05/04/2019 6:57 PM

## 2019-05-04 NOTE — Care Management Important Message (Signed)
Important Message  Patient Details  Name: Kyle [name] MRN: [id number] Date of Birth: 12-Nov-1955   Medicare Important Message Given:  Yes     Orbie Pyo 05/04/2019, 12:13 PM

## 2019-05-04 NOTE — Progress Notes (Signed)
Patient with CBG 68. Given orange juice per cortrak tube. New tube feeding bottle hung. Patient asymptomatic. Will recheck CBG in 1 hour. Wendee Copp

## 2019-05-04 NOTE — TOC Progression Note (Signed)
Transition of Care [facility]) - Progression Note    Patient Details  Name: RHAKEEM VYAS MRN: [id number] Date of Birth: July 06, 1955  Transition of Care [facility]) CM/SW [place], [place] Work Phone Number: 05/04/2019, 4:07 PM  Clinical Narrative:    MSW Intern called PT cousin Legrand Como to touch base and give choices for SNF options. His wife answered and said that he would call SW back when he got home, this was around 11:30 am. MSW Intern called again at 4:00pm, and left a message to call the SW on call tomorrow. SW will continue to follow.      Expected Discharge Plan and Services                                                 Social Determinants of Health (SDOH) Interventions    Readmission Risk Interventions No flowsheet data found.

## 2019-05-04 NOTE — Consult Note (Addendum)
[facility] Surgery Consult Note  Kyle [name] 1955-08-29  [id number].    Requesting MD: [name] Chief Complaint/Reason for Consult: PEG  HPI:  Kyle [name] is a 63yo male PMH atrial fibrillation (not anticoagulated), HTN, h/o CVA, tobacco abuse, and OSA who was admitted to [facility] 11/15 with aphasia and left sided paralysis. He was found to have right MCA infarct due to right MCA occulusion. Patient underwent TICI 2c revascularization by IR. He is currently tolerating tube feedings but has been unable to pass swallow evaluation with speech therapy. General surgery consulted for consideration of PEG.  Of note, patient has a h/o failed gastroplasty in 1992, as well as a modified sleeve gastrectomy by Dr. Hassell Done in 2013.   ROS: Review of Systems  Constitutional: Negative.   HENT: Negative.   Eyes: Negative.   Respiratory: Negative.   Cardiovascular: Positive for leg swelling. Negative for chest pain.  Gastrointestinal: Negative.   Genitourinary: Negative.   Musculoskeletal: Negative.   Skin: Negative.   Neurological: Positive for speech change and weakness.  Psychiatric/Behavioral: Negative.    All systems reviewed and otherwise negative except for as above  Family History  Problem Relation Age of Onset  . Cancer Mother        melanoma  . Heart disease Mother   . Rheum arthritis Mother   . Lung cancer Father        melanoma  . Heart disease Father   . Heart disease Sister     Past Medical History:  Diagnosis Date  . Arthritis   . Atrial fibrillation    HX OF SICK SINUS SYNDROME-ATRIAL FIB  . Back pain, chronic    PT STATES 5 BULGING DISKS AND PINCHED NERVES--PT ON OXYCODONE 4 TIMES A DAY FOR HIS PAIN  . Chest pain 08/26/2015  . Chronic airway obstruction, not elsewhere classified 12/21/2012  . GERD (gastroesophageal reflux disease)   . Hypertension   . Impaired fasting glucose 04/19/2013  . Peripheral vascular disease (HCC)    CHRONIC VENOUS INSUFFICIENCY   . Shortness of breath   . 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 Stroke ([place])   . Wears dentures     Past Surgical History:  Procedure Laterality Date  . ESOPHAGOGASTRODUODENOSCOPY  05/18/2011   Procedure: ESOPHAGOGASTRODUODENOSCOPY (EGD);  Surgeon: Shann Medal, MD;  Location: [facility];  Service: Endoscopy;  Laterality: N/A;  . FOOT SURGERY  2003   right foot surgery from work accident   . GASTRIC BYPASS  10/19/11  . GASTRIC RESTRICTION SURGERY  1992  . LEG SURGERY  1998   calcium deposit removed on right  leg   . RADIOLOGY WITH ANESTHESIA N/A 04/30/2019   Procedure: CODE STROKE;  Surgeon: Radiologist, Medication, MD;  Location: [place];  Service: Radiology;  Laterality: N/A;    Social History:  reports that he has been smoking cigarettes. He has a 20.00 pack-year smoking history. He has never used smokeless tobacco. He reports current alcohol use. He reports that he does not use drugs.  Allergies:  Allergies  Allergen Reactions  . Vioxx [Rofecoxib] Rash    Medications Prior to Admission  Medication Sig Dispense Refill  . albuterol (PROVENTIL HFA;VENTOLIN HFA) 108 (90 Base) MCG/ACT inhaler Inhale 2 puffs into the lungs every 4 (four) hours as needed for wheezing or shortness of breath. 1 Inhaler 5  . amitriptyline (ELAVIL) 50 MG tablet TAKE  1 TABLET BY MOUTH EVERYDAY AT BEDTIME (Patient taking differently: Take 50 mg by mouth at bedtime. ) 30 tablet 0  . aspirin EC 81 MG tablet Take 81 mg by mouth daily.    Marland Kitchen atorvastatin (LIPITOR) 20 MG tablet Take 1 tablet (20 mg total) by mouth daily. For cholesterol. (Patient not taking: Reported on 04/30/2019) 90 tablet 3  . atorvastatin (LIPITOR) 40 MG tablet Take 40 mg by mouth daily.    Marland Kitchen gabapentin (NEURONTIN) 600 MG tablet TAKE 1 TABLET BY MOUTH THREE TIMES A DAY (Patient taking differently: Take 600 mg by mouth 4 (four) times daily. ) 90 tablet 0  .  lisinopril (PRINIVIL,ZESTRIL) 20 MG tablet Take 1 tablet (20 mg total) by mouth daily. For blood pressure. (Patient not taking: Reported on 04/30/2019) 90 tablet 0  . lisinopril (ZESTRIL) 10 MG tablet Take 10 mg by mouth daily.    . metoprolol succinate (TOPROL-XL) 100 MG 24 hr tablet Take 100 mg by mouth daily.    Marland Kitchen nystatin cream (MYCOSTATIN)     . oxyCODONE (OXY IR/ROXICODONE) 5 MG immediate release tablet Take 5 mg by mouth 4 (four) times daily as needed (pain).     . pantoprazole (PROTONIX) 40 MG tablet Take 40 mg by mouth daily.    Marland Kitchen PARoxetine (PAXIL) 10 MG tablet Take 10 mg by mouth daily.      Prior to Admission medications   Medication Sig Start Date End Date Taking? Authorizing Provider  albuterol (PROVENTIL HFA;VENTOLIN HFA) 108 (90 Base) MCG/ACT inhaler Inhale 2 puffs into the lungs every 4 (four) hours as needed for wheezing or shortness of breath. 08/30/15   Gildardo Cranker, DO  amitriptyline (ELAVIL) 50 MG tablet TAKE 1 TABLET BY MOUTH EVERYDAY AT BEDTIME Patient taking differently: Take 50 mg by mouth at bedtime.  10/14/18   Pleas Koch, NP  aspirin EC 81 MG tablet Take 81 mg by mouth daily.    [provider]  atorvastatin (LIPITOR) 20 MG tablet Take 1 tablet (20 mg total) by mouth daily. For cholesterol. Patient not taking: Reported on 04/30/2019 08/24/18   Pleas Koch, NP  atorvastatin (LIPITOR) 40 MG tablet Take 40 mg by mouth daily. 04/20/19   [provider]  gabapentin (NEURONTIN) 600 MG tablet TAKE 1 TABLET BY MOUTH THREE TIMES A DAY Patient taking differently: Take 600 mg by mouth 4 (four) times daily.  10/11/18   Pleas Koch, NP  lisinopril (PRINIVIL,ZESTRIL) 20 MG tablet Take 1 tablet (20 mg total) by mouth daily. For blood pressure. Patient not taking: Reported on 04/30/2019 08/16/18   Pleas Koch, NP  lisinopril (ZESTRIL) 10 MG tablet Take 10 mg by mouth daily. 04/20/19   [provider]  metoprolol succinate  (TOPROL-XL) 100 MG 24 hr tablet Take 100 mg by mouth daily. 01/19/19   [provider]  nystatin cream (MYCOSTATIN)  04/20/19   [provider]  oxyCODONE (OXY IR/ROXICODONE) 5 MG immediate release tablet Take 5 mg by mouth 4 (four) times daily as needed (pain).  01/19/19   [provider]  pantoprazole (PROTONIX) 40 MG tablet Take 40 mg by mouth daily. 01/19/19   [provider]  PARoxetine (PAXIL) 10 MG tablet Take 10 mg by mouth daily. 01/19/19   [provider]  metoprolol tartrate (LOPRESSOR) 25 MG tablet Take 1 tablet (25 mg total) by mouth 2 (two) times daily. For blood pressure and heart rate. Patient not taking: Reported on 01/21/2019 09/15/18 01/21/19  Pleas Koch, NP  rivaroxaban (XARELTO) 20 MG TABS tablet Take 1 tablet (20 mg total) by mouth daily with supper. For blood clot prevention. Patient not taking: Reported on 10/03/2018 08/24/18 01/21/19  Pleas Koch, NP    Blood pressure (!) 162/85, pulse (!) 56, temperature 97.8 F (36.6 C), temperature source Oral, resp. rate 16, height 6\' 3"  (1.905 m), weight (!) 157.4 kg, SpO2 98 %. Physical Exam: General: obese white male who is laying in bed in NAD HEENT: head is normocephalic, atraumatic.  Sclera are noninjected.  Pupils equal and round and reactive to light.  EOMs intact. Ears and nose without any masses or lesions.  Mouth is dry. Cortrak in place. Dentition fair Heart: irregular.  Palpable pedal pulses bilaterally Lungs: CTAB, no wheezes, rhonchi, or rales noted.  Respiratory effort nonlabored Abd: obese, well healed midline incision, soft, NT/ND, +BS, no masses, hernias, or organomegaly MS: calves soft and nontender Skin: warm and dry with no masses, lesions, or rashes Psych: Alert, nonverbal for me Neuro: follows commands BUE/BLE, moving all 4 extremities with mild LUE/LLE weakness  Results for orders placed or performed during the hospital encounter of 04/30/19 (from the past 48  hour(s))  Glucose, capillary     Status: Abnormal   Collection Time: 05/02/19  3:18 PM  Result Value Ref Range   Glucose-Capillary 68 (L) 70 - 99 mg/dL   Comment 1 Notify RN    Comment 2 Document in Chart   Glucose, capillary     Status: None   Collection Time: 05/02/19  5:10 PM  Result Value Ref Range   Glucose-Capillary 76 70 - 99 mg/dL   Comment 1 Notify RN    Comment 2 Document in Chart   Magnesium     Status: Abnormal   Collection Time: 05/02/19  5:24 PM  Result Value Ref Range   Magnesium 1.6 (L) 1.7 - 2.4 mg/dL    Comment: Performed at [facility], [address], [place], [place] [address]  Phosphorus     Status: None   Collection Time: 05/02/19  5:24 PM  Result Value Ref Range   Phosphorus 3.1 2.5 - 4.6 mg/dL    Comment: Performed at [facility] [address], [place], [place] [address]  Glucose, capillary     Status: None   Collection Time: 05/02/19  8:08 PM  Result Value Ref Range   Glucose-Capillary 76 70 - 99 mg/dL   Comment 1 Notify RN    Comment 2 Document in Chart   Glucose, capillary     Status: None   Collection Time: 05/03/19 12:06 AM  Result Value Ref Range   Glucose-Capillary 86 70 - 99 mg/dL   Comment 1 Notify RN    Comment 2 Document in Chart   Glucose, capillary     Status: Abnormal   Collection Time: 05/03/19  3:29 AM  Result Value Ref Range   Glucose-Capillary 100 (H) 70 - 99 mg/dL   Comment 1 Notify RN    Comment 2 Document in Chart   CBC     Status: None   Collection Time: 05/03/19  5:00 AM  Result Value Ref Range   WBC 7.6 4.0 - 10.5 K/uL   RBC 5.23 4.22 - 5.81 MIL/uL   Hemoglobin 14.5 13.0 - 17.0 g/dL   HCT 44.1 39.0 - 52.0 %   MCV 84.3 80.0 - 100.0 fL   MCH 27.7 26.0 - 34.0 pg   MCHC 32.9 30.0 -  36.0 g/dL   RDW 15.1 11.5 - 15.5 %   Platelets 194 150 - 400 K/uL   nRBC 0.0 0.0 - 0.2 %    Comment: Performed at [facility], [place] [address], [place], [place] [id number]  Basic metabolic panel     Status: Abnormal    Collection Time: 05/03/19  5:00 AM  Result Value Ref Range   Sodium 137 135 - 145 mmol/L   Potassium 3.8 3.5 - 5.1 mmol/L   Chloride 104 98 - 111 mmol/L   CO2 23 22 - 32 mmol/L   Glucose, Bld 103 (H) 70 - 99 mg/dL   BUN 18 8 - 23 mg/dL   Creatinine, Ser 0.68 0.61 - 1.24 mg/dL   Calcium 8.9 8.9 - 10.3 mg/dL   GFR calc non Af Amer >60 >60 mL/min   GFR calc Af Amer >60 >60 mL/min   Anion gap 10 5 - 15    Comment: Performed at [facility] [address], [place], [place] [address]  Magnesium     Status: None   Collection Time: 05/03/19  5:00 AM  Result Value Ref Range   Magnesium 1.8 1.7 - 2.4 mg/dL    Comment: Performed at [facility] [address] [place], [place] [address]  Phosphorus     Status: None   Collection Time: 05/03/19  5:00 AM  Result Value Ref Range   Phosphorus 3.4 2.5 - 4.6 mg/dL    Comment: Performed at [facility] [address], [place], [place] [address]  Glucose, capillary     Status: None   Collection Time: 05/03/19  8:36 AM  Result Value Ref Range   Glucose-Capillary 90 70 - 99 mg/dL   Comment 1 Notify RN    Comment 2 Document in Chart   Glucose, capillary     Status: Abnormal   Collection Time: 05/03/19 12:46 PM  Result Value Ref Range   Glucose-Capillary 111 (H) 70 - 99 mg/dL   Comment 1 Notify RN    Comment 2 Document in Chart   Magnesium     Status: None   Collection Time: 05/03/19  4:46 PM  Result Value Ref Range   Magnesium 1.9 1.7 - 2.4 mg/dL    Comment: Performed at [name], [place] [address], [place], [place] [address]  Phosphorus     Status: None   Collection Time: 05/03/19  4:46 PM  Result Value Ref Range   Phosphorus 2.9 2.5 - 4.6 mg/dL    Comment: Performed at [facility] [address], [place], [place] [address]  Glucose, capillary     Status: None   Collection Time: 05/03/19  5:08 PM  Result Value Ref Range   Glucose-Capillary 83 70 - 99 mg/dL   Comment 1 Notify RN    Comment 2 Document in  Chart   Glucose, capillary     Status: Abnormal   Collection Time: 05/03/19  8:20 PM  Result Value Ref Range   Glucose-Capillary 110 (H) 70 - 99 mg/dL   Comment 1 Notify RN    Comment 2 Document in Chart   Glucose, capillary     Status: Abnormal   Collection Time: 05/03/19 11:59 PM  Result Value Ref Range   Glucose-Capillary 110 (H) 70 - 99 mg/dL   Comment 1 Notify RN    Comment 2 Document in Chart   Glucose, capillary     Status: Abnormal  Collection Time: 05/04/19  4:10 AM  Result Value Ref Range   Glucose-Capillary 103 (H) 70 - 99 mg/dL   Comment 1 Notify RN    Comment 2 Document in Chart   CBC     Status: None   Collection Time: 05/04/19  4:51 AM  Result Value Ref Range   WBC 8.0 4.0 - 10.5 K/uL   RBC 5.25 4.22 - 5.81 MIL/uL   Hemoglobin 14.3 13.0 - 17.0 g/dL   HCT 45.0 39.0 - 52.0 %   MCV 85.7 80.0 - 100.0 fL   MCH 27.2 26.0 - 34.0 pg   MCHC 31.8 30.0 - 36.0 g/dL   RDW 15.3 11.5 - 15.5 %   Platelets 214 150 - 400 K/uL   nRBC 0.0 0.0 - 0.2 %    Comment: Performed at [facility], [place] [address], [place], [place] [id number]  Basic metabolic panel     Status: Abnormal   Collection Time: 05/04/19  4:51 AM  Result Value Ref Range   Sodium 137 135 - 145 mmol/L   Potassium 3.7 3.5 - 5.1 mmol/L   Chloride 100 98 - 111 mmol/L   CO2 26 22 - 32 mmol/L   Glucose, Bld 104 (H) 70 - 99 mg/dL   BUN 22 8 - 23 mg/dL   Creatinine, Ser 0.69 0.61 - 1.24 mg/dL   Calcium 8.9 8.9 - 10.3 mg/dL   GFR calc non Af Amer >60 >60 mL/min   GFR calc Af Amer >60 >60 mL/min   Anion gap 11 5 - 15    Comment: Performed at [facility] [address] [place], [place] [address]  Glucose, capillary     Status: None   Collection Time: 05/04/19  7:33 AM  Result Value Ref Range   Glucose-Capillary 88 70 - 99 mg/dL  Glucose, capillary     Status: None   Collection Time: 05/04/19 11:53 AM  Result Value Ref Range   Glucose-Capillary 87 70 - 99 mg/dL   Dg Abd 1 View  Result Date:  05/02/2019 CLINICAL DATA:  NG tube placement, history of gastric bypass EXAM: ABDOMEN - 1 VIEW; DG NASO G TUBE PLC W/FL-NO RAD CONTRAST:  37mL OMNIPAQUE IOHEXOL 300 MG/ML  SOLN FLUOROSCOPY TIME:  Fluoroscopy Time:  5 minutes 12 seconds Number of Acquired Spot Images: 1 COMPARISON:  None. FINDINGS: Enteric tube is not well seen but injected contrast opacifies the lumen of the stomach. IMPRESSION: Enteric tube placement within the stomach. Electronically Signed   By: Macy Mis M.D.   On: 05/02/2019 15:31   Dg Addison Bailey G Tube Plc W/fl-no Rad  Result Date: 05/02/2019 CLINICAL DATA:  NG tube placement, history of gastric bypass EXAM: ABDOMEN - 1 VIEW; DG NASO G TUBE PLC W/FL-NO RAD CONTRAST:  17mL OMNIPAQUE IOHEXOL 300 MG/ML  SOLN FLUOROSCOPY TIME:  Fluoroscopy Time:  5 minutes 12 seconds Number of Acquired Spot Images: 1 COMPARISON:  None. FINDINGS: Enteric tube is not well seen but injected contrast opacifies the lumen of the stomach. IMPRESSION: Enteric tube placement within the stomach. Electronically Signed   By: Macy Mis M.D.   On: 05/02/2019 15:31   Anti-infectives (From admission, onward)   Start     Dose/Rate Route Frequency Ordered Stop   04/30/19 1227  ceFAZolin (ANCEF) 2-4 GM/100ML-% IVPB    Note to Pharmacy: Margaretmary Dys   : cabinet override      04/30/19 1227 05/01/19 0029  Assessment/Plan Atrial fibrillation HTN H/o CVA Tobacco abuse OSA  Right MCA infarct s/p TICI 2c revascularization by IR 04/30/19 Dysphagia H/o failed gastroplasty in 1992, modified sleeve gastrectomy by Dr. Hassell Done in 2013 - Patient with complicated abdominal surgical history and the need for gastrostomy tube placement for tube feedings in the setting of acute stroke. Case discussed with multiple surgeons. He is not a candidate for PEG. May be able to perform laparoscopic gastrostomy tube. One of our bariatric surgeons will be on acute general surgery call next week during the day. Plan to review  case with her and plan for procedure next week.  ID - none currently VTE - SCDs FEN - TF Foley - none Follow up - TBD  Wellington Hampshire, [facility] Surgery 05/04/2019, 2:42 PM Please see Amion for pager number during day hours 7:00am-4:30pm

## 2019-05-05 ENCOUNTER — Ambulatory Visit: Payer: Medicare PPO | Admitting: Gastroenterology

## 2019-05-05 DIAGNOSIS — Z8673 Personal history of transient ischemic attack (TIA), and cerebral infarction without residual deficits: Secondary | ICD-10-CM

## 2019-05-05 LAB — BASIC METABOLIC PANEL
Anion gap: 12 (ref 5–15)
BUN: 21 mg/dL (ref 8–23)
CO2: 24 mmol/L (ref 22–32)
Calcium: 8.6 mg/dL — ABNORMAL LOW (ref 8.9–10.3)
Chloride: 101 mmol/L (ref 98–111)
Creatinine, Ser: 0.75 mg/dL (ref 0.61–1.24)
GFR calc Af Amer: >60 mL/min (ref >60)
GFR calc non Af Amer: >60 mL/min (ref >60)
Glucose, Bld: 113 mg/dL — ABNORMAL HIGH (ref 70–99)
Potassium: 3.9 mmol/L (ref 3.5–5.1)
Sodium: 137 mmol/L (ref 135–145)

## 2019-05-05 LAB — GLUCOSE, CAPILLARY
Glucose-Capillary: 101 mg/dL — ABNORMAL HIGH (ref 70–99)
Glucose-Capillary: 106 mg/dL — ABNORMAL HIGH (ref 70–99)
Glucose-Capillary: 92 mg/dL (ref 70–99)
Glucose-Capillary: 102 mg/dL — ABNORMAL HIGH (ref 70–99)
Glucose-Capillary: 61 mg/dL — ABNORMAL LOW (ref 70–99)
Glucose-Capillary: 74 mg/dL (ref 70–99)

## 2019-05-05 LAB — CBC
HCT: 44.4 % (ref 39.0–52.0)
Hemoglobin: 14.3 g/dL (ref 13.0–17.0)
MCH: 27.4 pg (ref 26.0–34.0)
MCHC: 32.2 g/dL (ref 30.0–36.0)
MCV: 85.1 fL (ref 80.0–100.0)
Platelets: 202 10*3/uL (ref 150–400)
RBC: 5.22 MIL/uL (ref 4.22–5.81)
RDW: 15.1 % (ref 11.5–15.5)
WBC: 8.2 10*3/uL (ref 4.0–10.5)
nRBC: 0 % (ref 0.0–0.2)

## 2019-05-05 LAB — HEPARIN LEVEL (UNFRACTIONATED)
Heparin Unfractionated: 0.13 IU/mL — ABNORMAL LOW (ref 0.30–0.70)
Heparin Unfractionated: 0.43 IU/mL (ref 0.30–0.70)
Heparin Unfractionated: 0.5 IU/mL (ref 0.30–0.70)

## 2019-05-05 MED ORDER — DEXTROSE 50 % IV SOLN
INTRAVENOUS | Status: AC
  Administered 2019-05-05: 25 mL
  Filled 2019-05-05: qty 50

## 2019-05-05 NOTE — Plan of Care (Signed)

## 2019-05-05 NOTE — Progress Notes (Signed)
  Speech Language Pathology Treatment: Dysphagia;Cognitive-Linquistic  Patient Details Name: Kyle [name] MRN: [id number] DOB: 07/11/1955 Today's Date: 05/05/2019 Time: [id number] SLP Time Calculation (min) (ACUTE ONLY): 23 min  Assessment / Plan / Recommendation Clinical Impression  Pt communicating via writing pen/paper- has clipboard and therapist provided him with a legal pad. Imitation of /uh/ in 3 sequential utterances. Visual, verbal and tactile assist to shape vowel/consonants during activity using blowing tissue to produce /w/ and and chapstick on lips for /m/. Almost approximated lips but no contact and initiated mild lip rounding for /w/.  Mild lingual lateralization with ice chip which he is unable to perform on command. Swallow produced x 2- weak appearing. Frequent anterior saliva leakage in which he is sensate. He was unable to gather and contain saliva. Asked pt to use mirror (SLP used camera earlier) during practice to close lips and contain saliva. He is being worked up for PEG in re: to gastric bypass surgery.    HPI HPI: Kyle [name] is a 63 y.o. M with hx of CVA, sleep apnea, SOB, peripheral vascular disease, HPT, GERD, gastric bypass surgery(2013), atrial fibrillation, arthritis, tobacco use, and ETOH admitted 11/15 after friend found sitting in car, confused. Head CT Showed hyperdense R MCA bifurcation, no acute infarct and atrophy with chronic temporal infarct / chronic L frontal infarct. MRI showed R MCA restricuted diffusion primarily involving operculum. No associated hemorrhage or mass efect. Underlying bilateral MCA chronic encephalomalacia. Briefly intubated 11/15 for arteriogram. Recently admitted to ED 11/06 for fall with R ankle sprain and 11/08 for another fall, NR reported pt is chronic opiate user due to chronic back pain. Diet currently NPO.       SLP Plan  Continue with current plan of care       Recommendations  Diet recommendations: NPO                 General recommendations: Rehab consult Oral Care Recommendations: Oral care QID Follow up Recommendations: Inpatient Rehab SLP Visit Diagnosis: Dysarthria and anarthria (R47.1);Cognitive communication deficit (R41.841);Apraxia (R48.2) Plan: Continue with current plan of care       GO                Kyle [name] 05/05/2019, 2:03 PM  Kyle [name].Ed Risk analyst [phone] Office [phone]

## 2019-05-05 NOTE — Progress Notes (Signed)
ANTICOAGULATION CONSULT NOTE - Initial Consult  Pharmacy Consult for heparin Indication: atrial fibrillation and stroke  Allergies  Allergen Reactions  . Vioxx [Rofecoxib] Rash    Patient Measurements: Height: 6\' 3"  (190.5 cm) Weight: (!) 347 lb 0.1 oz (157.4 kg) IBW/kg (Calculated) : 84.5 Heparin Dosing Weight: 121 kg  Vital Signs: Temp: 98.1 F (36.7 C) (11/20 1945) Temp Source: Oral (11/20 1945) BP: 148/86 (11/20 1945) Pulse Rate: 70 (11/20 1945)  Labs: Recent Labs    05/03/19 0500 05/04/19 0451 05/05/19 0237 05/05/19 1200 05/05/19 2153  HGB 14.5 14.3 14.3  --   --   HCT 44.1 45.0 44.4  --   --   PLT 194 214 202  --   --   HEPARINUNFRC  --   --  0.13* 0.43 0.50  CREATININE 0.68 0.69 0.75  --   --     Estimated Creatinine Clearance: 155.9 mL/min (by C-G formula based on SCr of 0.75 mg/dL).  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 Hx of Afib with stroke likely d/t not being on anticoagulation.  Planning possible PEG tube next week  Heparin level 0.50 units/ml, (within goal range) CBC WNL Per RN, no issues with IV or bleeding observed.  Goal of Therapy:  Heparin level 0.3 - 0.5 units/ml Monitor platelets by anticoagulation protocol: Yes   Plan:  Continue Heparin gtt at 1900 units/hr (no bolus d/t stroke) Daily heparin level, CBC F/U plans for possible PEG, conversion to oraanticoagulant  Gillermina Hu, PharmD, BCPS, [facility] Clinical Pharmacist 05/05/2019, 10:30 PM

## 2019-05-05 NOTE — Progress Notes (Signed)
Tele called to report SVR pauses of 2.17 seconds and 2.10 seconds. Pt brady down to 43.   Upon assessment pt was A&Ox4.  MD notified and requested 12 lead EKG. 12 lead EKG completed and placed in pt chart.

## 2019-05-05 NOTE — Progress Notes (Signed)
ANTICOAGULATION CONSULT NOTE - Initial Consult  Pharmacy Consult for heparin Indication: atrial fibrillation and stroke  Allergies  Allergen Reactions  . Vioxx [Rofecoxib] Rash    Patient Measurements: Height: 6\' 3"  (190.5 cm) Weight: (!) 347 lb 0.1 oz (157.4 kg) IBW/kg (Calculated) : 84.5 Heparin Dosing Weight: 121 kg  Vital Signs: Temp: 98.2 F (36.8 C) (11/20 1138) Temp Source: Oral (11/20 1138) BP: 148/92 (11/20 1138) Pulse Rate: 62 (11/20 1138)  Labs: Recent Labs    05/03/19 0500 05/04/19 0451 05/05/19 0237 05/05/19 1200  HGB 14.5 14.3 14.3  --   HCT 44.1 45.0 44.4  --   PLT 194 214 202  --   HEPARINUNFRC  --   --  0.13* 0.43  CREATININE 0.68 0.69 0.75  --     Estimated Creatinine Clearance: 155.9 mL/min (by C-G formula based on SCr of 0.75 mg/dL).  Assessment: 63 yo m presented with right MCA infarct due to MCA occlusion s/p IR Hx of Afib with stroke likely d/t not being on [facility]  Planning possible PEG tube next week  Heparin level 0.43 (therapeutic) CBC stable  Goal of Therapy:  Heparin level 0.3 - 0.5 units/ml Monitor platelets by anticoagulation protocol: Yes   Plan:  Continue Heparin gtt at 1900 units/hr (no bolus d/t stroke) F/u hep lvl 2000 Daily HL CBC F/U plans for possible PEG, conversion to oral [facility]  Alanda Slim, PharmD, [facility] Clinical Pharmacist Please see AMION for all Pharmacists' Contact Phone Numbers 05/05/2019, 1:03 PM

## 2019-05-05 NOTE — Progress Notes (Signed)
Occupational Therapy Treatment Patient Details Name: Kyle [name] MRN: [id number] DOB: 1956/05/12 Today's Date: 05/05/2019    History of present illness Kyle [name] is an 63 y.o. male  With PMH a. Fib ( not anticoagulated) sick sinus syndrom, HTN, CVA, OSA ( not on CPAP) who presented to [facility] as a code stroke. Pt found to have R MCA acute ischemic stroke due to right M1 occlusion, pt s/p mechanical thrombectomy.   OT comments  Pt progressing well toward stated goals. Pt able to complete bed mobility at sit <> stand at min guard/min A level. He was noted to have marked improvement in L sided sensory deficits and awareness as well. Pt able to locate and match 3/3 items without difficulty on L side. He remains nonverbal due to inability to move mouth, but able to communicate his wants and needs well via pen and paper.  He continues to show deficits in attention and safety. Continue to recommend pt for CIR, as he is an excellent candidate. Will continue to follow acutely.    Follow Up Recommendations  CIR;Supervision/Assistance - 24 hour    Equipment Recommendations  Other (comment)(TBD)    Recommendations for Other Services      Precautions / Restrictions Precautions Precautions: Fall Restrictions Weight Bearing Restrictions: No       Mobility Bed Mobility Overal bed mobility: Needs Assistance Bed Mobility: Supine to Sit     Supine to sit: Min guard     General bed mobility comments: pt able to pull to sitting with rail at min guard; increased difficulty 2/2 air mattress  Transfers Overall transfer level: Needs assistance Equipment used: Rolling walker (2 wheeled) Transfers: Sit to/from Stand   Stand pivot transfers: Min assist;+2 physical assistance;+2 safety/equipment       General transfer comment: min A to stand and steady due to air mattress. Was able to complete x3 sit <> stands from recliner without phys assist    Balance Overall balance assessment: Needs  assistance Sitting-balance support: Feet supported;Single extremity supported Sitting balance-Leahy Scale: Good     Standing balance support: Bilateral upper extremity supported;During functional activity Standing balance-Leahy Scale: Fair Standing balance comment: dependent on RW or physical assist                           ADL either performed or assessed with clinical judgement   ADL Overall ADL's : Needs assistance/impaired Eating/Feeding: NPO Eating/Feeding Details (indicate cue type and reason): unable to purse lips, drooling                 Lower Body Dressing: Total assistance;Sitting/lateral leans;Sit to/from stand Lower Body Dressing Details (indicate cue type and reason): attempting to reach down for sock Toilet Transfer: Minimal assistance;RW;Ambulation Toilet Transfer Details (indicate cue type and reason): simulated with recliner Toileting- Clothing Manipulation and Hygiene: Minimal assistance;Sitting/lateral lean;Sit to/from stand       Functional mobility during ADLs: Minimal assistance;Rolling walker General ADL Comments: pt with much improved mobility this date, L sided deficits also much improved     Vision   Additional Comments: pt able to attend to L side this date and match items from R on L without difficulty   Perception     Praxis      Cognition Arousal/Alertness: Awake/alert Behavior During Therapy: WFL for tasks assessed/performed Overall Cognitive Status: Impaired/Different from baseline Area of Impairment: Following commands;Safety/judgement;Attention  Current Attention Level: Selective   Following Commands: Follows one step commands consistently Safety/Judgement: Decreased awareness of deficits   Problem Solving: Requires verbal cues General Comments: pt showing decreased attention, but able to follow all one step commands with cues. Shows decreased awareness of deficits. Cognition has improved  from evaluation        Exercises Other Exercises Other Exercises: seated LE: LAQ, marching, sit to stand x 3 reps   Shoulder Instructions       General Comments      Pertinent Vitals/ Pain       Pain Assessment: Faces Faces Pain Scale: Hurts a little bit Pain Location: throat (says it is from tube) Pain Descriptors / Indicators: Sore Pain Intervention(s): Monitored during session  Home Living                                          Prior Functioning/Environment              Frequency  Min 2X/week        Progress Toward Goals  OT Goals(current goals can now be found in the care plan section)  Progress towards OT goals: Progressing toward goals  Acute Rehab OT Goals OT Goal Formulation: With patient Time For Goal Achievement: 05/15/19 Potential to Achieve Goals: Good  Plan Discharge plan remains appropriate;Frequency remains appropriate    Co-evaluation    PT/OT/SLP Co-Evaluation/Treatment: Yes Reason for Co-Treatment: For patient/therapist safety;To address functional/ADL transfers PT goals addressed during session: Mobility/safety with mobility;Balance OT goals addressed during session: ADL's and self-care;Strengthening/ROM      AM-PAC OT "6 Clicks" Daily Activity     Outcome Measure   Help from another person eating meals?: Total(NPO) Help from another person taking care of personal grooming?: A Little Help from another person toileting, which includes using toliet, bedpan, or urinal?: A Little Help from another person bathing (including washing, rinsing, drying)?: A Lot Help from another person to put on and taking off regular upper body clothing?: A Little Help from another person to put on and taking off regular lower body clothing?: Total 6 Click Score: 13    End of Session Equipment Utilized During Treatment: Gait belt  OT Visit Diagnosis: Unsteadiness on feet (R26.81);Other abnormalities of gait and mobility  (R26.89);Hemiplegia and hemiparesis;Cognitive communication deficit (R41.841);Other symptoms and signs involving cognitive function Symptoms and signs involving cognitive functions: Cerebral infarction Hemiplegia - Right/Left: Left Hemiplegia - dominant/non-dominant: Non-Dominant Hemiplegia - caused by: Cerebral infarction   Activity Tolerance Patient tolerated treatment well   Patient Left in bed;with call bell/phone within reach;with bed alarm set   Nurse Communication Mobility status;Need for lift equipment        Time: 0923-0950 OT Time Calculation (min): 27 min  Charges: OT General Charges $OT Visit: 1 Visit OT Treatments $Self Care/Home Management : 8-22 mins  Zenovia Jarred, MSOT, OTR/L [place] OT/ Acute Relief OT [facility] Office: [phone]  Zenovia Jarred 05/05/2019, 11:40 AM

## 2019-05-05 NOTE — TOC Initial Note (Signed)
Transition of Care [facility]) - Initial/Assessment Note    Patient Details  Name: Kyle [name] MRN: [id number] Date of Birth: 1955/10/12  Transition of Care [facility]) CM/SW Contact:    Geralynn Ochs, LCSW Phone Number: 05/05/2019, 4:16 PM  Clinical Narrative:     CSW attempted to speak to Legrand Como about SNF placement and bed offers. CSW left a voicemail for Legrand Como to call back.              Expected Discharge Plan: Skilled Nursing Facility Barriers to Discharge: Continued Medical Work up, Ship broker   Patient Goals and CMS Choice Patient states their goals for this hospitalization and ongoing recovery are:: patient unable to state goals due to confusion      Expected Discharge Plan and Services Expected Discharge Plan: [place] arrangements for the past 2 months: Single Family Home                                      Prior Living Arrangements/Services Living arrangements for the past 2 months: Single Family Home Lives with:: Self, Relatives Patient language and need for interpreter reviewed:: No        Need for Family Participation in Patient Care: Yes (Comment) Care giver support system in place?: No (comment)   Criminal Activity/Legal Involvement Pertinent to Current Situation/Hospitalization: No - Comment as needed  Activities of Daily Living      Permission Sought/Granted Permission sought to share information with : Facility Sport and exercise psychologist, Family Supports Permission granted to share information with : Yes, Verbal Permission Granted  Share Information with NAME: Legrand Como  Permission granted to share info w AGENCY: SNF  Permission granted to share info w Relationship: Cousin     Emotional Assessment   Attitude/Demeanor/Rapport: Unable to Assess Affect (typically observed): Unable to Assess Orientation: : Oriented to Self Alcohol / Substance Use: Not Applicable Psych Involvement: No  (comment)  Admission diagnosis:  Stroke [facility]) [I63.9] Patient Active Problem List   Diagnosis Date Noted  . Stroke ([place]) 04/30/2019  . Middle cerebral artery embolism, right 04/30/2019  . Chronic atrial fibrillation ([place]) 08/26/2015  . Hyperlipidemia LDL goal <100 06/26/2014  . Routine general medical examination at a health care facility 11/21/2013  . Screening PSA (prostate specific antigen) 11/21/2013  . Special screening for malignant neoplasms, colon 11/21/2013  . OSA (obstructive sleep apnea) 08/22/2013  . Morbid obesity ([place]) 04/19/2013  . Other emphysema ([place]) 01/31/2013  . Essential hypertension 12/21/2012  . Insomnia 12/21/2012  . GERD (gastroesophageal reflux disease) 09/20/2012  . Back pain, chronic 09/20/2012  . Obesity hypoventilation syndrome ([place]) 09/20/2012  . Adjustment disorder with mixed anxiety and depressed mood 09/20/2012   PCP:  [name], MD Pharmacy:   [id number] - [place] [place] [address] Phone: [phone] Fax: [place], [place] - [address] [place] [address] Phone: [phone] Fax: [phone]     Social Determinants of Health (SDOH) Interventions    Readmission Risk Interventions No flowsheet data found.

## 2019-05-05 NOTE — Progress Notes (Signed)
Called by RN with pt brady down to 43. Pauses 2 sec x2. Asymptomatic  Rec: C/w tele Check 12 lead EKG Check labs to include Mg  -- Amie Portland, MD [facility] Pager: [phone] If 7pm to 7am, please call on call as listed on AMION.

## 2019-05-05 NOTE — Progress Notes (Signed)
STROKE TEAM PROGRESS NOTE   INTERVAL HISTORY Pt sitting in chair, speech therapist at bedside. Pt still has severe dysphagia and anarthria. Still likely needs PEG next week.     Vitals:   05/05/19 0320 05/05/19 0741 05/05/19 0827 05/05/19 1138  BP: (!) 132/94 (!) 149/73  (!) 148/92  Pulse: 63 (!) 57 68 62  Resp: _0 Temp: 98.2 F (36.8 C) 98.1 F (36.7 C)  98.2 F (36.8 C)  TempSrc: Axillary Oral  Oral  SpO2: 95% 100%  98%  Weight:      Height:        CBC:  Recent Labs  Lab 04/30/19 1152  05/01/19 0630  05/04/19 0451 05/05/19 0237  WBC 9.2  --  11.3*   < > 8.0 8.2  NEUTROABS 7.0  --  9.2*  --   --   --   HGB 15.5   < > 15.4   < > 14.3 14.3  HCT 48.0   < > 46.5   < > 45.0 44.4  MCV 85.6  --  84.2   < > 85.7 85.1  PLT 241  --  157   < > 214 202   < > = values in this interval not displayed.   Basic Metabolic Panel:  Recent Labs  Lab 05/03/19 0500 05/03/19 1646 05/04/19 0451 05/05/19 0237  NA 137  --  137 137  K 3.8  --  3.7 3.9  CL 104  --  100 101  CO2 23  --  26 24  GLUCOSE 103*  --  104* 113*  BUN 18  --  22 21  CREATININE 0.68  --  0.69 0.75  CALCIUM 8.9  --  8.9 8.6*  MG 1.8 1.9  --   --   PHOS 3.4 2.9  --   --    Lipid Panel:     Component Value Date/Time   CHOL 102 05/01/2019 0630   CHOL 142 12/19/2014 0804   TRIG 98 05/01/2019 0630   HDL 25 (L) 05/01/2019 0630   HDL 29 (L) 12/19/2014 0804   CHOLHDL 4.1 05/01/2019 0630   VLDL 20 05/01/2019 0630   LDLCALC 57 05/01/2019 0630   LDLCALC 93 12/19/2014 0804   HgbA1c:  Lab Results  Component Value Date   HGBA1C 5.5 05/01/2019   Urine Drug Screen:     Component Value Date/Time   LABOPIA NONE DETECTED 04/30/2019 1507   COCAINSCRNUR NONE DETECTED 04/30/2019 1507   COCAINSCRNUR Negative 03/15/2015 1141   LABBENZ NONE DETECTED 04/30/2019 1507   AMPHETMU POSITIVE (A) 04/30/2019 1507   THCU NONE DETECTED 04/30/2019 1507   LABBARB NONE DETECTED 04/30/2019 1507    Alcohol Level      Component Value Date/Time   ETH <10 04/30/2019 1152    IMAGING past 24h No results found.  Cerebral angiogram 05/01/2019 S/P complete revascularization of occluded RT MCA M1 seg with x 1 pass with SolitaireX 91m x 40 mm ret river ,and aspiration achieving a TICI 2c revascularization.  2D Echocardiogram  1. Left ventricular ejection fraction, by visual estimation, is 60 to 65%. The left ventricle has normal function. There is moderately increased left ventricular hypertrophy.  2. Left ventricular diastolic function could not be evaluated.  3. Global right ventricle has normal systolic function.The right ventricular size is normal. No increase in right ventricular wall thickness.  4. Left atrial size was severely dilated.  5. Right atrial size was mildly dilated.  6.  Mild to moderate mitral annular calcification.  7. The mitral valve is grossly normal. No evidence of mitral valve regurgitation.  8. The tricuspid valve is grossly normal. Tricuspid valve regurgitation is trivial.  9. The aortic valve is tricuspid. Aortic valve regurgitation is mild. Mild aortic valve sclerosis without stenosis. 10. The pulmonic valve was not well visualized. Pulmonic valve regurgitation is not visualized.   PHYSICAL EXAM  Temp:  [98 F (36.7 C)-98.3 F (36.8 C)] 98.2 F (36.8 C) (11/20 1138) Pulse Rate:  [53-68] 62 (11/20 1138) Resp:  [13-18] 17 (11/20 1138) BP: (121-173)/(72-94) 148/92 (11/20 1138) SpO2:  [95 %-100 %] 98 % (11/20 1138) Weight:  [157.4 kg] 157.4 kg (11/19 1854)  General - morbid obesity, well developed, not in acute distress.  Ophthalmologic - fundi not visualized due to noncooperation.  Cardiovascular - irregularly irregular heart rate and rhythm.  Neuro - awake alert, still anarthric, not able to answer orientation questions. Follows all simple commands, not able to name or repeat, seems to make right sounds when ask to repeat "I am here". PERRL, right gaze full but  incomplete left gaze, blinking to visual threat bilaterally. Bilateral facial weakness but left seems more than right. Tongue protrusion difficulty. RUE 5/5, LUE 4+/5 proximal and 4/5 finger grip. RLE 5/5. LLE 4+/5 proximal and 5-/5 distal. DTR 1+ and no babinski. Sensation symmetrical. Bilateral FTN intact. Gait not tested.    ASSESSMENT/PLAN Mr. HANCEL ION is a 63 y.o. male with history of a. Fib ( not anticoagulated) sick sinus syndrom, HTN, CVA, OSA ( not on CPAP) presenting with right side gaze and left arm paralysis.   Stroke:   R MCA infarct due to right MCA occlusion s/p IR w/ TICI2c revascularization -  Embolic econdary to known AF not on [facility]  Code Stroke CT head No acute abnormality. Probably hyperdense R MCA bifurcation. Old R temporal lobe and L frontal lobe infarcts. .     CTA head & neck suboptimal. Occlusion R MCA at bifurcation.  CT perfusion large delayed perfusion R MCA. No core infarct.   Cerebral angio occluded R M1, TICI2c   MRI  R MCA infarct (operculum). B MCA encephalomalacia  MRA  Improved R MCA patency  2D Echo EF 60-65%. No source of embolus   LDL 57   HgbA1c 5.5   HIV neg  Lovenox 40 mg sq daily for VTE prophylaxis  aspirin 81 mg daily prior to admission, now on IV heparin given possible PEG placement next week.   Therapy recommendations:  CIR->family prefer SNF for longer term care.  Disposition:  Pending.  Bed obtained. Need to address swallow proir to d.c   Atrial Fibrillation  Home anticoagulation:  not taking   Started on Xarelto 09/15/2018, documented not taking 04/23/2019 (family practice note) . Now on IV heparin given possible PEG placement.    Hypertension  Home meds:  Lisinopril 20 . Treated w/ cleviprex, now off. Labetalol prn  . BP goal < 180 . BP stable now . Increase lisinopril 10 to bid and continue metoprolol 25 bid   . Long-term BP goal normotensive  Hyperlipidemia  Home meds:  Prescribed lipitor - ? taking  LDL  57, goal < 70  Now on lipitor 40  Continue statin at discharge  Dysphagia . Secondary to stroke . NPO . Speech on board . Has cortrak and TF . Trauma / IR cannot place PEG d/t gastroplasty in 1993. Bariatric GS to see on Monday for possible PEG. Marland Kitchen  Need to address swallow plan prior to SNF placement  Tobacco abuse  Current smoker  Smoking cessation counseling provided  Pt is willing to quit   Other Stroke Risk Factors  ETOH use, alcohol level <10, advised to drink no more than 2 drink(s) a day  Substance abuse UDS:  Amphetamines  POSITIVE  Morbid Obesity, Body mass index is 43.37 kg/m., hx failed open gastroplasty in 1993, recommend weight loss, diet and exercise as appropriate   Hx stroke/TIA by imaging - Old R temporal lobe and L frontal lobe infarcts - likely due to AFib  Obstructive sleep apnea, not on CPAP at home  Other Active Problems  Leukocytosis, resolved. WBC 9.2-> 11.3->6.8->7.6->8.0->8.2   COPD  Hypokalemia 3.2 - supplement - 3.8 - 3.7 - 3.9   Hospital day # 5  [name], MD PhD Stroke Neurology 05/05/2019 12:15 PM   To contact Stroke Continuity provider, please refer to [email or web address] After hours, contact General Neurology

## 2019-05-05 NOTE — Progress Notes (Signed)
Physical Therapy Treatment Patient Details Name: Kyle [name] MRN: [id number] DOB: 1955/09/02 Today's Date: 05/05/2019    History of Present Illness IAGO COTTINGHAM is an 63 y.o. male  With PMH a. Fib ( not anticoagulated) sick sinus syndrom, HTN, CVA, OSA ( not on CPAP) who presented to [facility] as a code stroke. Pt found to have R MCA acute ischemic stroke due to right M1 occlusion, pt s/p mechanical thrombectomy.    PT Comments    Patient received in bed, agrees to PT/OT session. Reports his throat is sore. Patient able to write to communicate. Performed bed mobility with min guard, heavy use of rails. Supervision for safety with sitting edge of bed. Patient able to sit without external support. Performed transfer with min assist due to air mattress not allowing him to scoot forward easily. He then ambulated 5 feet to recliner with RW and min +2 guard. Patient able to sit to stand x 3 reps from recliner with min guard. He will continue to benefit from skilled PT to improve strength and independence with mobility.         Follow Up Recommendations  CIR/SNF     Equipment Recommendations  None recommended by PT;Other (comment)(TBD)    Recommendations for Other Services Rehab consult     Precautions / Restrictions Precautions Precautions: Fall Restrictions Weight Bearing Restrictions: No    Mobility  Bed Mobility Overal bed mobility: Needs Assistance Bed Mobility: Supine to Sit     Supine to sit: Min guard     General bed mobility comments: able to pull himself to sitting using rail. Due to air mattress he was unable to get fully square on side of bed without assist.  Transfers Overall transfer level: Needs assistance Equipment used: Rolling walker (2 wheeled) Transfers: Sit to/from Stand   Stand pivot transfers: +2 safety/equipment;Min assist;+2 physical assistance       General transfer comment: min A to get standing from bed due to air mattress. Was able to stand  from recliner x 3 without assist.  Ambulation/Gait Ambulation/Gait assistance: Min guard;+2 physical assistance;+2 safety/equipment Gait Distance (Feet): 5 Feet Assistive device: Rolling walker (2 wheeled) Gait Pattern/deviations: Step-through pattern;Decreased stride length;Shuffle         Stairs             Wheelchair Mobility    Modified Rankin (Stroke Patients Only) Modified Rankin (Stroke Patients Only) Pre-Morbid Rankin Score: No significant disability Modified Rankin: Moderately severe disability     Balance Overall balance assessment: Needs assistance Sitting-balance support: Feet supported;Single extremity supported Sitting balance-Leahy Scale: Good     Standing balance support: Bilateral upper extremity supported;During functional activity Standing balance-Leahy Scale: Fair                              Cognition Arousal/Alertness: Awake/alert Behavior During Therapy: WFL for tasks assessed/performed Overall Cognitive Status: Within Functional Limits for tasks assessed                         Following Commands: Follows one step commands consistently       General Comments: Use of writing to communicate; following all one step commands. Continued right gaze preference      Exercises Other Exercises Other Exercises: seated LE: LAQ, marching, sit to stand x 3 reps    General Comments        Pertinent Vitals/Pain Pain Assessment: No/denies pain  Home Living                      Prior Function            PT Goals (current goals can now be found in the care plan section) Acute Rehab PT Goals PT Goal Formulation: With patient Time For Goal Achievement: 05/15/19 Potential to Achieve Goals: Good Progress towards PT goals: Progressing toward goals    Frequency    Min 3X/week      PT Plan Discharge plan needs to be updated    Co-evaluation PT/OT/SLP Co-Evaluation/Treatment: Yes Reason for  Co-Treatment: For patient/therapist safety;To address functional/ADL transfers PT goals addressed during session: Mobility/safety with mobility;Balance        AM-PAC PT "6 Clicks" Mobility   Outcome Measure  Help needed turning from your back to your side while in a flat bed without using bedrails?: A Little Help needed moving from lying on your back to sitting on the side of a flat bed without using bedrails?: A Lot Help needed moving to and from a bed to a chair (including a wheelchair)?: A Little Help needed standing up from a chair using your arms (e.g., wheelchair or bedside chair)?: A Little Help needed to walk in hospital room?: A Little Help needed climbing 3-5 steps with a railing? : A Lot 6 Click Score: 16    End of Session Equipment Utilized During Treatment: Gait belt Activity Tolerance: Patient tolerated treatment well Patient left: in chair;with call bell/phone within reach Nurse Communication: Mobility status PT Visit Diagnosis: Unsteadiness on feet (R26.81);Muscle weakness (generalized) (M62.81);Difficulty in walking, not elsewhere classified (R26.2);History of falling (Z91.81)     Time: [id number] PT Time Calculation (min) (ACUTE ONLY): 26 min  Charges:  $Therapeutic Exercise: 8-22 mins                     Mareena Cavan, PT, GCS 05/05/19,11:17 AM

## 2019-05-05 NOTE — Progress Notes (Signed)
[place] for heparin Indication: atrial fibrillation and stroke  Allergies  Allergen Reactions  . Vioxx [Rofecoxib] Rash    Patient Measurements: Height: 6\' 3"  (190.5 cm) Weight: (!) 347 lb 0.1 oz (157.4 kg) IBW/kg (Calculated) : 84.5 Heparin Dosing Weight: 121 kg  Vital Signs: Temp: 98 F (36.7 C) (11/19 2355) Temp Source: Axillary (11/19 2355) BP: 132/94 (11/20 0320) Pulse Rate: 63 (11/20 0320)  Labs: Recent Labs    05/03/19 0500 05/04/19 0451 05/05/19 0237  HGB 14.5 14.3 14.3  HCT 44.1 45.0 44.4  PLT 194 214 202  HEPARINUNFRC  --   --  0.13*  CREATININE 0.68 0.69 0.75    Estimated Creatinine Clearance: 155.9 mL/min (by C-G formula based on SCr of 0.75 mg/dL).  Assessment: 63 y.o. male with Afib and CVA foe heparin  Goal of Therapy:  Heparin level 0.3 - 0.5 units/ml Monitor platelets by anticoagulation protocol: Yes   Plan:  Increase Heparin  1900 units/hr Check heparin level in 8 hours.  Phillis Knack, PharmD, BCPS  05/05/2019 3:37 AM

## 2019-05-06 LAB — BASIC METABOLIC PANEL
Anion gap: 11 (ref 5–15)
Anion gap: 11 (ref 5–15)
BUN: 22 mg/dL (ref 8–23)
BUN: 21 mg/dL (ref 8–23)
CO2: 24 mmol/L (ref 22–32)
CO2: 25 mmol/L (ref 22–32)
Calcium: 8.8 mg/dL — ABNORMAL LOW (ref 8.9–10.3)
Calcium: 8.8 mg/dL — ABNORMAL LOW (ref 8.9–10.3)
Chloride: 100 mmol/L (ref 98–111)
Chloride: 98 mmol/L (ref 98–111)
Creatinine, Ser: 0.7 mg/dL (ref 0.61–1.24)
Creatinine, Ser: 0.69 mg/dL (ref 0.61–1.24)
GFR calc Af Amer: >60 mL/min (ref >60)
GFR calc Af Amer: >60 mL/min (ref >60)
GFR calc non Af Amer: >60 mL/min (ref >60)
GFR calc non Af Amer: >60 mL/min (ref >60)
Glucose, Bld: 110 mg/dL — ABNORMAL HIGH (ref 70–99)
Glucose, Bld: 103 mg/dL — ABNORMAL HIGH (ref 70–99)
Potassium: 3.8 mmol/L (ref 3.5–5.1)
Potassium: 3.7 mmol/L (ref 3.5–5.1)
Sodium: 135 mmol/L (ref 135–145)
Sodium: 134 mmol/L — ABNORMAL LOW (ref 135–145)

## 2019-05-06 LAB — HEPARIN LEVEL (UNFRACTIONATED): Heparin Unfractionated: 0.55 IU/mL (ref 0.30–0.70)

## 2019-05-06 LAB — GLUCOSE, CAPILLARY
Glucose-Capillary: 114 mg/dL — ABNORMAL HIGH (ref 70–99)
Glucose-Capillary: 71 mg/dL (ref 70–99)
Glucose-Capillary: 85 mg/dL (ref 70–99)
Glucose-Capillary: 90 mg/dL (ref 70–99)
Glucose-Capillary: 94 mg/dL (ref 70–99)
Glucose-Capillary: 95 mg/dL (ref 70–99)
Glucose-Capillary: 95 mg/dL (ref 70–99)

## 2019-05-06 LAB — MAGNESIUM: Magnesium: 1.8 mg/dL (ref 1.7–2.4)

## 2019-05-06 MED ORDER — METOPROLOL TARTRATE 12.5 MG HALF TABLET
12.5000 mg | ORAL_TABLET | Freq: Two times a day (BID) | ORAL | Status: DC
  Administered 2019-05-06 – 2019-05-16 (×19): 12.5 mg
  Filled 2019-05-06 (×20): qty 1

## 2019-05-06 NOTE — Progress Notes (Signed)
ANTICOAGULATION CONSULT NOTE - Follow up [place] for heparin Indication: atrial fibrillation and stroke  Allergies  Allergen Reactions  . Vioxx [Rofecoxib] Rash    Patient Measurements: Height: 6\' 3"  (190.5 cm) Weight: (!) 343 lb 11.2 oz (155.9 kg) IBW/kg (Calculated) : 84.5 Heparin Dosing Weight: 121 kg  Vital Signs: Temp: 98.6 F (37 C) (11/21 1142) Temp Source: Oral (11/21 1142) BP: 145/87 (11/21 1142) Pulse Rate: 55 (11/21 1143)  Labs: Recent Labs    05/04/19 0451  05/05/19 0237 05/05/19 1200 05/05/19 2153 05/05/19 2316 05/06/19 0321  HGB 14.3  --  14.3  --   --   --   --   HCT 45.0  --  44.4  --   --   --   --   PLT 214  --  202  --   --   --   --   HEPARINUNFRC  --    < > 0.13* 0.43 0.50  --  0.55  CREATININE 0.69  --  0.75  --   --  0.70 0.69   < > = values in this interval not displayed.    Estimated Creatinine Clearance: 155.1 mL/min (by C-G formula based on SCr of 0.69 mg/dL).  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 Hx of Afib with stroke likely d/t not being on anticoagulation.  Planning possible PEG tube next week - trauma/IR could not place due to gastroplasty in 1993. Patient needs to be seen by bariatric general surgery on Monday for possible PEG  Heparin level 0.55 units/ml, slightly supratherapeutic CBC WNL Per RN, no issues with IV or bleeding observed.  Goal of Therapy:  Heparin level 0.3 - 0.5 units/ml Monitor platelets by anticoagulation protocol: Yes   Plan:  Decrease Heparin gtt to 1800 units/hr  Daily heparin level, CBC F/U plans for possible PEG, conversion to oraanticoagulant  Nicoletta Dress, PharmD PGY2 Infectious Disease Pharmacy Resident  05/06/2019, 1:40 PM

## 2019-05-06 NOTE — TOC Progression Note (Signed)
Transition of Care [facility]) - Progression Note    Patient Details  Name: Kyle [name] MRN: [place]:[id number] Date of Birth: 12-27-1955  Transition of Care [facility]) CM/SW [place], [place] Phone Number: 05/06/2019, 1:18 PM  Clinical Narrative:     CSW called and left a voicemail for patient's cousin, Legrand Como. CSW is awaiting a return phone call.   Patient only has one current bed offer at [facility].   CSW will continue to follow and assist as needed.   Expected Discharge Plan: [place] Barriers to Discharge: Continued Medical Work up, Ship broker  Expected Discharge Plan and Services Expected Discharge Plan: [place] arrangements for the past 2 months: Single Family Home                                       Social Determinants of Health (SDOH) Interventions    Readmission Risk Interventions No flowsheet data found.

## 2019-05-06 NOTE — Progress Notes (Signed)
STROKE TEAM PROGRESS NOTE   INTERVAL HISTORY (subjective) Had a asymptomatic brady episode overnight. Resting comfortably in bed and writing on pad for communication. Pt still has severe dysphagia and anarthria. Still likely needs PEG next week.     Vitals:   05/05/19 1945 05/05/19 2321 05/06/19 0327 05/06/19 0335  BP: (!) 148/86 129/77 (!) 159/85   Pulse: 70 69 74   Resp: 17 17 16    Temp: 98.1 F (36.7 C) 98.2 F (36.8 C) 98.1 F (36.7 C)   TempSrc: Oral Oral Oral   SpO2: 100% 96% 97%   Weight:    (!) 155.9 kg  Height:        CBC:  Recent Labs  Lab 04/30/19 1152  05/01/19 0630  05/04/19 0451 05/05/19 0237  WBC 9.2  --  11.3*   < > 8.0 8.2  NEUTROABS 7.0  --  9.2*  --   --   --   HGB 15.5   < > 15.4   < > 14.3 14.3  HCT 48.0   < > 46.5   < > 45.0 44.4  MCV 85.6  --  84.2   < > 85.7 85.1  PLT 241  --  157   < > 214 202   < > = values in this interval not displayed.   Basic Metabolic Panel:  Recent Labs  Lab 05/03/19 0500 05/03/19 1646  05/05/19 2316 05/06/19 0321  NA 137  --    < > 135 134*  K 3.8  --    < > 3.8 3.7  CL 104  --    < > 100 98  CO2 23  --    < > 24 25  GLUCOSE 103*  --    < > 110* 103*  BUN 18  --    < > 22 21  CREATININE 0.68  --    < > 0.70 0.69  CALCIUM 8.9  --    < > 8.8* 8.8*  MG 1.8 1.9  --  1.8  --   PHOS 3.4 2.9  --   --   --    < > = values in this interval not displayed.   Lipid Panel:     Component Value Date/Time   CHOL 102 05/01/2019 0630   CHOL 142 12/19/2014 0804   TRIG 98 05/01/2019 0630   HDL 25 (L) 05/01/2019 0630   HDL 29 (L) 12/19/2014 0804   CHOLHDL 4.1 05/01/2019 0630   VLDL 20 05/01/2019 0630   LDLCALC 57 05/01/2019 0630   LDLCALC 93 12/19/2014 0804   HgbA1c:  Lab Results  Component Value Date   HGBA1C 5.5 05/01/2019   Urine Drug Screen:     Component Value Date/Time   LABOPIA NONE DETECTED 04/30/2019 1507   COCAINSCRNUR NONE DETECTED 04/30/2019 1507   COCAINSCRNUR Negative 03/15/2015 1141   LABBENZ  NONE DETECTED 04/30/2019 1507   AMPHETMU POSITIVE (A) 04/30/2019 1507   THCU NONE DETECTED 04/30/2019 1507   LABBARB NONE DETECTED 04/30/2019 1507    Alcohol Level     Component Value Date/Time   ETH <10 04/30/2019 1152    IMAGING past 24h No results found.  Cerebral angiogram 05/01/2019 S/P complete revascularization of occluded RT MCA M1 seg with x 1 pass with SolitaireX 52m x 40 mm ret river ,and aspiration achieving a TICI 2c revascularization.  2D Echocardiogram  1. Left ventricular ejection fraction, by visual estimation, is 60 to 65%. The left ventricle has normal  function. There is moderately increased left ventricular hypertrophy.  2. Left ventricular diastolic function could not be evaluated.  3. Global right ventricle has normal systolic function.The right ventricular size is normal. No increase in right ventricular wall thickness.  4. Left atrial size was severely dilated.  5. Right atrial size was mildly dilated.  6. Mild to moderate mitral annular calcification.  7. The mitral valve is grossly normal. No evidence of mitral valve regurgitation.  8. The tricuspid valve is grossly normal. Tricuspid valve regurgitation is trivial.  9. The aortic valve is tricuspid. Aortic valve regurgitation is mild. Mild aortic valve sclerosis without stenosis. 10. The pulmonic valve was not well visualized. Pulmonic valve regurgitation is not visualized.   PHYSICAL EXAM  Temp:  [98.1 F (36.7 C)-98.5 F (36.9 C)] 98.1 F (36.7 C) (11/21 0327) Pulse Rate:  [57-74] 74 (11/21 0327) Resp:  [14-17] 16 (11/21 0327) BP: (127-159)/(71-92) 159/85 (11/21 0327) SpO2:  [96 %-100 %] 97 % (11/21 0327) Weight:  [155.9 kg] 155.9 kg (11/21 0335)  General - morbid obesity, well developed, not in acute distress.  Ophthalmologic - fundi not visualized due to noncooperation.  Cardiovascular - irregularly irregular heart rate and rhythm.  Neuro - awake alert, still anarthric, not able to  answer orientation questions but able to write them out on a note bad, he is fully oriented.. Follows all simple commands PERRL, right gaze full but incomplete left gaze, blinking to visual threat bilaterally. Bilateral facial weakness but left seems more than right. Tongue protrusion difficulty. RUE 5/5, LUE 4+/5 proximal and 4/5 finger grip. RLE 5/5. LLE 4+/5 proximal and 5-/5 distal. DTR 1+ and no babinski. Sensation symmetrical. Bilateral FTN intact. Gait not tested.    ASSESSMENT/PLAN [name] is a 63 y.o. male with history of a. Fib ( not anticoagulated) sick sinus syndrom, HTN, CVA, OSA ( not on CPAP) presenting with right side gaze and left arm paralysis.   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 Probably hyperdense R MCA bifurcation. Old R temporal lobe and L frontal lobe infarcts. .     CTA head & neck suboptimal. Occlusion R MCA at bifurcation.  CT perfusion large delayed perfusion R MCA. No core infarct.   Cerebral angio occluded R M1, TICI2c   MRI  R MCA infarct (operculum). B MCA encephalomalacia  MRA  Improved R MCA patency  2D Echo EF 60-65%. No source of embolus   LDL 57   HgbA1c 5.5   HIV neg  Lovenox 40 mg sq daily for VTE prophylaxis  aspirin 81 mg daily prior to admission, now on IV heparin given possible PEG placement next week.   Therapy recommendations:  CIR->family prefer SNF for longer term care.  Disposition:  Pending.  Bed obtained. Need to address swallow proir to d.c   Atrial Fibrillation  Home anticoagulation:  not taking   Started on Xarelto 09/15/2018, documented not taking 04/23/2019 (family practice note) . Now on IV heparin given possible PEG placement.    Hypertension  Home meds:  Lisinopril 20 . Treated w/ cleviprex, now off. Labetalol prn  . BP goal < 180 . BP stable now . Increase lisinopril  10 to bid and metoprolol 25 bid, will decrease to 12.5 BID due to bradycardia episode and monitor    . Long-term BP goal normotensive  Hyperlipidemia  Home meds:  Prescribed lipitor - ? taking  LDL 57, goal < 70  Now on lipitor 40  Continue statin at discharge  Dysphagia . Secondary to stroke . NPO . Speech on board . Has cortrak and TF . Trauma / IR cannot place PEG d/t gastroplasty in 1993. Bariatric GS to see on Monday for possible PEG. . Need to address swallow plan prior to SNF placement  Tobacco abuse  Current smoker  Smoking cessation counseling provided  Pt is willing to quit   Other Stroke Risk Factors  ETOH use, alcohol level <10, advised to drink no more than 2 drink(s) a day  Substance abuse UDS:  Amphetamines  POSITIVE  Morbid Obesity, Body mass index is 42.96 kg/m., hx failed open gastroplasty in 1993, recommend weight loss, diet and exercise as appropriate   Hx stroke/TIA by imaging - Old R temporal lobe and L frontal lobe infarcts - likely due to AFib  Obstructive sleep apnea, not on CPAP at home  Other Active Problems  Leukocytosis, resolved. WBC 9.2-> 11.3->6.8->7.6->8.0->8.2   COPD  Hypokalemia 3.2 - supplement - 3.8 -> 3.7 -> 3.9 -> 3.7   Afib - Bradycardia with pauses (2 seconds) - Dr Rory Percy saw 11/20 - pt asymptomatic - currently rate in 70's - will decrease metoprolol dose.  PLAN  PEG placement  SNF  Decrease metoprolol from 25 mg Bid to 12.5 mg Bid and continue to monitor.  Hospital day # 6     To contact Stroke Continuity provider, please refer to [email or web address] After hours, contact General Neurology

## 2019-05-07 LAB — GLUCOSE, CAPILLARY
Glucose-Capillary: 99 mg/dL (ref 70–99)
Glucose-Capillary: 106 mg/dL — ABNORMAL HIGH (ref 70–99)
Glucose-Capillary: 120 mg/dL — ABNORMAL HIGH (ref 70–99)
Glucose-Capillary: 88 mg/dL (ref 70–99)
Glucose-Capillary: 93 mg/dL (ref 70–99)

## 2019-05-07 LAB — CBC
HCT: 44.5 % (ref 39.0–52.0)
Hemoglobin: 14.3 g/dL (ref 13.0–17.0)
MCH: 27.4 pg (ref 26.0–34.0)
MCHC: 32.1 g/dL (ref 30.0–36.0)
MCV: 85.2 fL (ref 80.0–100.0)
Platelets: 250 10*3/uL (ref 150–400)
RBC: 5.22 MIL/uL (ref 4.22–5.81)
RDW: 15.4 % (ref 11.5–15.5)
WBC: 9.1 10*3/uL (ref 4.0–10.5)
nRBC: 0 % (ref 0.0–0.2)

## 2019-05-07 LAB — HEPARIN LEVEL (UNFRACTIONATED): Heparin Unfractionated: 0.42 IU/mL (ref 0.30–0.70)

## 2019-05-07 MED ORDER — FENTANYL CITRATE (PF) 100 MCG/2ML IJ SOLN
12.5000 ug | INTRAMUSCULAR | Status: DC | PRN
  Administered 2019-05-07 – 2019-05-17 (×5): 12.5 ug via INTRAVENOUS
  Filled 2019-05-07 (×5): qty 2

## 2019-05-07 NOTE — TOC Progression Note (Addendum)
Transition of Care [facility]) - Progression Note    Patient Details  Name: Kyle [name] MRN: [place]:[id number] Date of Birth: 08/16/1955  Transition of Care [facility]) CM/SW [place], [place] Phone Number: 05/07/2019, 9:39 AM  Clinical Narrative:     CSW received phone call from the patient's cousin, [name]. CSW introduced herself and explained her role. CSW shared therapy recommendations and provided current bed offer.   Patient's cousin in agreeable to SNF at [facility]. He stated that the patient does not have a place to return back to after rehab. He stated that the patient got behind on his rent and he has now been evicted. [name] said that it was his relatives property. CSW asked how far behind the patient had gotten in the rent, he replied $1500.00. CSW asked if the family was going to start putting a plan in place for the patient, he stated that he would. The patient cannot stay with him due to him having a terminal illness and his landlord not allowing others to stay on the property. He stated if he had other staying in his home he would be violating his lease. CSW stated she understood.   The patient receives about $600-700.00 per month in SSI. CSW asked if he could speak to his relative to see if they could work out a payment plan to help pay owed back rent and still allow the patient to remain at his current residence, he stated he would address it.   In the meantime, plan is for the patient to go to rehab.   CSW will continue to follow and assist with disposition planning.   Expected Discharge Plan: [place] Barriers to Discharge: Continued Medical Work up, Ship broker  Expected Discharge Plan and Services Expected Discharge Plan: [place] arrangements for the past 2 months: Single Family Home                                       Social Determinants of Health (SDOH) Interventions     Readmission Risk Interventions No flowsheet data found.

## 2019-05-07 NOTE — Progress Notes (Signed)
STROKE TEAM PROGRESS NOTE   INTERVAL HISTORY (subjective) No events overnight, denies any complaints    Vitals:   05/06/19 1920 05/06/19 2315 05/07/19 0209 05/07/19 0404  BP: (!) 149/79 (!) 117/57  96/84  Pulse: 61 60  69  Resp: 19 20  19   Temp: (!) 97.5 F (36.4 C) (!) 97.5 F (36.4 C)  (!) 97.5 F (36.4 C)  TempSrc: Oral Oral  Oral  SpO2: 97% 93%  95%  Weight:   (!) 150 kg   Height:        CBC:  Recent Labs  Lab 04/30/19 1152  05/01/19 0630  05/05/19 0237 05/07/19 0331  WBC 9.2  --  11.3*   < > 8.2 9.1  NEUTROABS 7.0  --  9.2*  --   --   --   HGB 15.5   < > 15.4   < > 14.3 14.3  HCT 48.0   < > 46.5   < > 44.4 44.5  MCV 85.6  --  84.2   < > 85.1 85.2  PLT 241  --  157   < > 202 250   < > = values in this interval not displayed.   Basic Metabolic Panel:  Recent Labs  Lab 05/03/19 0500 05/03/19 1646  05/05/19 2316 05/06/19 0321  NA 137  --    < > 135 134*  K 3.8  --    < > 3.8 3.7  CL 104  --    < > 100 98  CO2 23  --    < > 24 25  GLUCOSE 103*  --    < > 110* 103*  BUN 18  --    < > 22 21  CREATININE 0.68  --    < > 0.70 0.69  CALCIUM 8.9  --    < > 8.8* 8.8*  MG 1.8 1.9  --  1.8  --   PHOS 3.4 2.9  --   --   --    < > = values in this interval not displayed.   Lipid Panel:     Component Value Date/Time   CHOL 102 05/01/2019 0630   CHOL 142 12/19/2014 0804   TRIG 98 05/01/2019 0630   HDL 25 (L) 05/01/2019 0630   HDL 29 (L) 12/19/2014 0804   CHOLHDL 4.1 05/01/2019 0630   VLDL 20 05/01/2019 0630   LDLCALC 57 05/01/2019 0630   LDLCALC 93 12/19/2014 0804   HgbA1c:  Lab Results  Component Value Date   HGBA1C 5.5 05/01/2019   Urine Drug Screen:     Component Value Date/Time   LABOPIA NONE DETECTED 04/30/2019 1507   COCAINSCRNUR NONE DETECTED 04/30/2019 1507   COCAINSCRNUR Negative 03/15/2015 1141   LABBENZ NONE DETECTED 04/30/2019 1507   AMPHETMU POSITIVE (A) 04/30/2019 1507   THCU NONE DETECTED 04/30/2019 1507   LABBARB NONE DETECTED  04/30/2019 1507    Alcohol Level     Component Value Date/Time   ETH <10 04/30/2019 1152    IMAGING past 24h No results found.  Cerebral angiogram 05/01/2019 S/P complete revascularization of occluded RT MCA M1 seg with x 1 pass with SolitaireX 75m x 40 mm ret river ,and aspiration achieving a TICI 2c revascularization.  2D Echocardiogram  1. Left ventricular ejection fraction, by visual estimation, is 60 to 65%. The left ventricle has normal function. There is moderately increased left ventricular hypertrophy.  2. Left ventricular diastolic function could not be evaluated.  3. Global  right ventricle has normal systolic function.The right ventricular size is normal. No increase in right ventricular wall thickness.  4. Left atrial size was severely dilated.  5. Right atrial size was mildly dilated.  6. Mild to moderate mitral annular calcification.  7. The mitral valve is grossly normal. No evidence of mitral valve regurgitation.  8. The tricuspid valve is grossly normal. Tricuspid valve regurgitation is trivial.  9. The aortic valve is tricuspid. Aortic valve regurgitation is mild. Mild aortic valve sclerosis without stenosis. 10. The pulmonic valve was not well visualized. Pulmonic valve regurgitation is not visualized.   PHYSICAL EXAM- unchanged from yesterday  Temp:  [97.5 F (36.4 C)-98.6 F (37 C)] 97.5 F (36.4 C) (11/22 0404) Pulse Rate:  [43-72] 69 (11/22 0404) Resp:  [9-20] 19 (11/22 0404) BP: (96-168)/(57-124) 96/84 (11/22 0404) SpO2:  [93 %-99 %] 95 % (11/22 0404) Weight:  [150 kg] 150 kg (11/22 0209)  General - morbid obesity, well developed, not in acute distress.  Ophthalmologic - fundi not visualized due to noncooperation.  Cardiovascular - irregularly irregular heart rate and rhythm.  Neuro - awake alert, still anarthric, not able to answer orientation questions but able to write them out on a note bad, he is fully oriented.. Follows all simple commands  PERRL, right gaze full but incomplete left gaze, blinking to visual threat bilaterally. Bilateral facial weakness but left seems more than right. Tongue protrusion difficulty. RUE 5/5, LUE 4+/5 proximal and 4/5 finger grip. RLE 5/5. LLE 4+/5 proximal and 5-/5 distal. DTR 1+ and no babinski. Sensation symmetrical. Bilateral FTN intact. Gait not tested.    ASSESSMENT/PLAN [name] is a 63 y.o. male with history of a. Fib ( not anticoagulated) sick sinus syndrom, HTN, CVA, OSA ( not on CPAP) presenting with right side gaze and left arm paralysis.   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 Probably hyperdense R MCA bifurcation. Old R temporal lobe and L frontal lobe infarcts. .     CTA head & neck suboptimal. Occlusion R MCA at bifurcation.  CT perfusion large delayed perfusion R MCA. No core infarct.   Cerebral angio occluded R M1, TICI2c   MRI  R MCA infarct (operculum). B MCA encephalomalacia  MRA  Improved R MCA patency  2D Echo EF 60-65%. No source of embolus   LDL 57   HgbA1c 5.5   HIV neg  Lovenox 40 mg sq daily for VTE prophylaxis  aspirin 81 mg daily prior to admission, now on IV heparin given possible PEG placement next week.   Therapy recommendations:  CIR->family prefer SNF for longer term care.  Disposition:  Pending.  Bed obtained. Need to address swallow proir to d.c   Atrial Fibrillation  Home anticoagulation:  not taking   Started on Xarelto 09/15/2018, documented not taking 04/23/2019 (family practice note) . Now on IV heparin given possible PEG placement.    Hypertension  Home meds:  Lisinopril 20 . Treated w/ cleviprex, now off. Labetalol prn  . BP goal < 180 . BP stable now . Increase lisinopril 10 to bid and metoprolol 12.5 bid . Long-term BP goal normotensive  Hyperlipidemia  Home meds:  Prescribed lipitor -  ? taking  LDL 57, goal < 70  Now on lipitor 40  Continue statin at discharge  Dysphagia . Secondary to stroke .  NPO . Speech on board . Has cortrak and TF . Trauma / IR cannot place PEG d/t gastroplasty in 1993. Bariatric GS to see on Monday for possible PEG. . Need to address swallow plan prior to SNF placement  Tobacco abuse  Current smoker  Smoking cessation counseling provided  Pt is willing to quit   Other Stroke Risk Factors  ETOH use, alcohol level <10, advised to drink no more than 2 drink(s) a day  Substance abuse UDS:  Amphetamines  POSITIVE  Morbid Obesity, Body mass index is 41.33 kg/m., hx failed open gastroplasty in 1993, recommend weight loss, diet and exercise as appropriate   Hx stroke/TIA by imaging - Old R temporal lobe and L frontal lobe infarcts - likely due to AFib  Obstructive sleep apnea, not on CPAP at home  Other Active Problems  Leukocytosis, resolved. WBC 9.2-> 11.3->6.8->7.6->8.0->8.2   COPD  Hypokalemia 3.2 - supplement - 3.8 -> 3.7 -> 3.9 -> 3.7   Afib - Bradycardia with pauses (2 seconds) - Dr Rory Percy saw 11/20 - pt asymptomatic - currently rate in 70's - will decrease metoprolol dose.  PLAN  PEG placement  SNF  Hospital day # 7     To contact Stroke Continuity provider, please refer to [email or web address] After hours, contact General Neurology

## 2019-05-07 NOTE — Progress Notes (Signed)
ANTICOAGULATION CONSULT NOTE - Follow up [place] for heparin Indication: atrial fibrillation and stroke  Allergies  Allergen Reactions  . Vioxx [Rofecoxib] Rash    Patient Measurements: Height: 6\' 3"  (190.5 cm) Weight: (!) 330 lb 11 oz (150 kg) IBW/kg (Calculated) : 84.5 Heparin Dosing Weight: 121 kg  Vital Signs: Temp: 98.1 F (36.7 C) (11/22 0859) Temp Source: Oral (11/22 0859) BP: 147/76 (11/22 0859) Pulse Rate: 79 (11/22 0859)  Labs: Recent Labs    05/05/19 0237  05/05/19 2153 05/05/19 2316 05/06/19 0321 05/07/19 0331  HGB 14.3  --   --   --   --  14.3  HCT 44.4  --   --   --   --  44.5  PLT 202  --   --   --   --  250  HEPARINUNFRC 0.13*   < > 0.50  --  0.55 0.42  CREATININE 0.75  --   --  0.70 0.69  --    < > = values in this interval not displayed.    Estimated Creatinine Clearance: 151.8 mL/min (by C-G formula based on SCr of 0.69 mg/dL).  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 Hx of Afib with stroke likely d/t not being on anticoagulation.  Planning possible PEG tube next week - trauma/IR could not place due to gastroplasty in 1993. Patient needs to be seen by bariatric general surgery on Monday for possible PEG  Heparin level 0.42, therapeutic CBC WNL Per RN, no issues with IV or bleeding observed.  Goal of Therapy:  Heparin level 0.3 - 0.5 units/ml Monitor platelets by anticoagulation protocol: Yes   Plan:  Continue Heparin gtt to 1800 units/hr  Daily heparin level, CBC F/U plans for possible PEG, conversion to oral anticoagulant  Nicoletta Dress, PharmD PGY2 Infectious Disease Pharmacy Resident  05/07/2019, 11:01 AM

## 2019-05-07 NOTE — Progress Notes (Signed)
Patient called nurse into room around 1315. Pt complaining of chest pain, 8/10 pain. Describes it as a sharp pain. No numbness or tingling. BP 141/79 (98), P 65. MD [facility] notified. Sharp pain only lasted a few minutes. States no pain in his chest at this time. Verbal orders from Dr. Cristobal Goldmann to get EKG and Troponin if pain comes back. Nurse will continue to monitor. [place]

## 2019-05-07 NOTE — Progress Notes (Signed)
Called to room by patient. Patient's coretrak was found sticking out of his mouth. Removed coretrak and stopped all feedings. MD [facility] notified and needing another route for medications. Nurse will continue to monitor. [place]

## 2019-05-07 NOTE — Progress Notes (Signed)
RN called. Pt cortrak out and can not be replaced till tomorrow. OK to hold tube feeds and meds via tube till one is placed. IV PRNs can be used. Call back as needed.  -- Amie [place], MD [facility] Pager: [phone] If 7pm to 7am, please call on call as listed on AMION.

## 2019-05-08 ENCOUNTER — Encounter (HOSPITAL_COMMUNITY): Payer: Self-pay | Admitting: Anesthesiology

## 2019-05-08 ENCOUNTER — Inpatient Hospital Stay (HOSPITAL_COMMUNITY): Payer: Medicare PPO

## 2019-05-08 LAB — GLUCOSE, CAPILLARY
Glucose-Capillary: 109 mg/dL — ABNORMAL HIGH (ref 70–99)
Glucose-Capillary: 83 mg/dL (ref 70–99)
Glucose-Capillary: 91 mg/dL (ref 70–99)
Glucose-Capillary: 93 mg/dL (ref 70–99)
Glucose-Capillary: 95 mg/dL (ref 70–99)
Glucose-Capillary: 99 mg/dL (ref 70–99)

## 2019-05-08 LAB — CBC
HCT: 45.9 % (ref 39.0–52.0)
Hemoglobin: 15.1 g/dL (ref 13.0–17.0)
MCH: 27.7 pg (ref 26.0–34.0)
MCHC: 32.9 g/dL (ref 30.0–36.0)
MCV: 84.1 fL (ref 80.0–100.0)
Platelets: 264 10*3/uL (ref 150–400)
RBC: 5.46 MIL/uL (ref 4.22–5.81)
RDW: 15.4 % (ref 11.5–15.5)
WBC: 8.2 10*3/uL (ref 4.0–10.5)
nRBC: 0 % (ref 0.0–0.2)

## 2019-05-08 LAB — HEPARIN LEVEL (UNFRACTIONATED): Heparin Unfractionated: 0.4 IU/mL (ref 0.30–0.70)

## 2019-05-08 IMAGING — DX DG ABD PORTABLE 1V
1 series · 1 of 1 positions shown · non-contrast
Comparison: None.

CLINICAL DATA: 63-year-old male status post NG placement.

EXAM:
PORTABLE ABDOMEN - 1 VIEW

[abdomen kub]
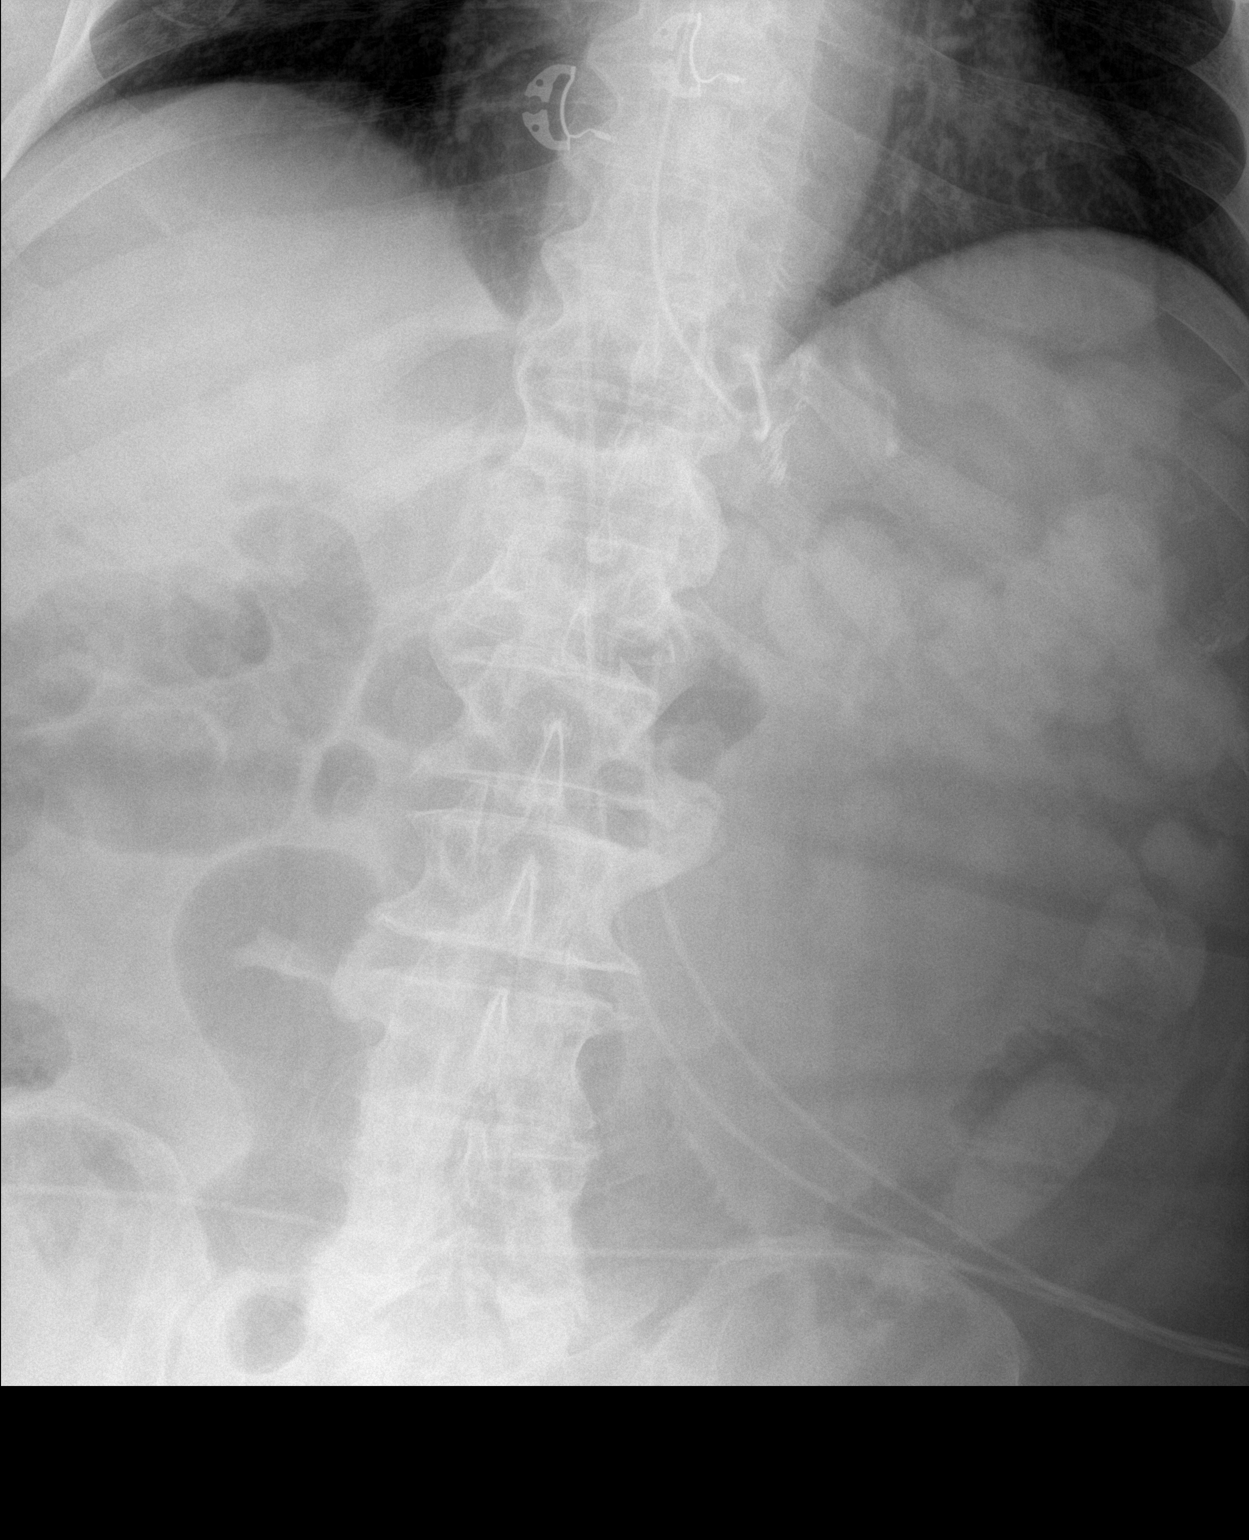

[1 of 1 positions shown; findings below may reference images not displayed]

FINDINGS: An enteric tube is partially visualized with tip and side-port in
the region of the GE junction. The tube appears to be bent on itself
distal to the side port. Evaluation and repositioning. Dilated small
bowel noted in the lower abdomen measuring up to 4.7 cm. The osseous
structures and soft tissues are unremarkable.
IMPRESSION: 1. Enteric tube bent on itself with tip and side-port in the region
of the GE junction. Evaluation and repositioning is recommended.
2. Dilated small bowel in the lower abdomen measuring up to 4.7 cm.

## 2019-05-08 IMAGING — DX DG ABD PORTABLE 1V
1 series · 1 of 1 positions shown · non-contrast
Comparison: [DATE] study obtained earlier in the day

CLINICAL DATA: Nasogastric tube placement

EXAM:
PORTABLE ABDOMEN - 1 VIEW

[abdomen kub]
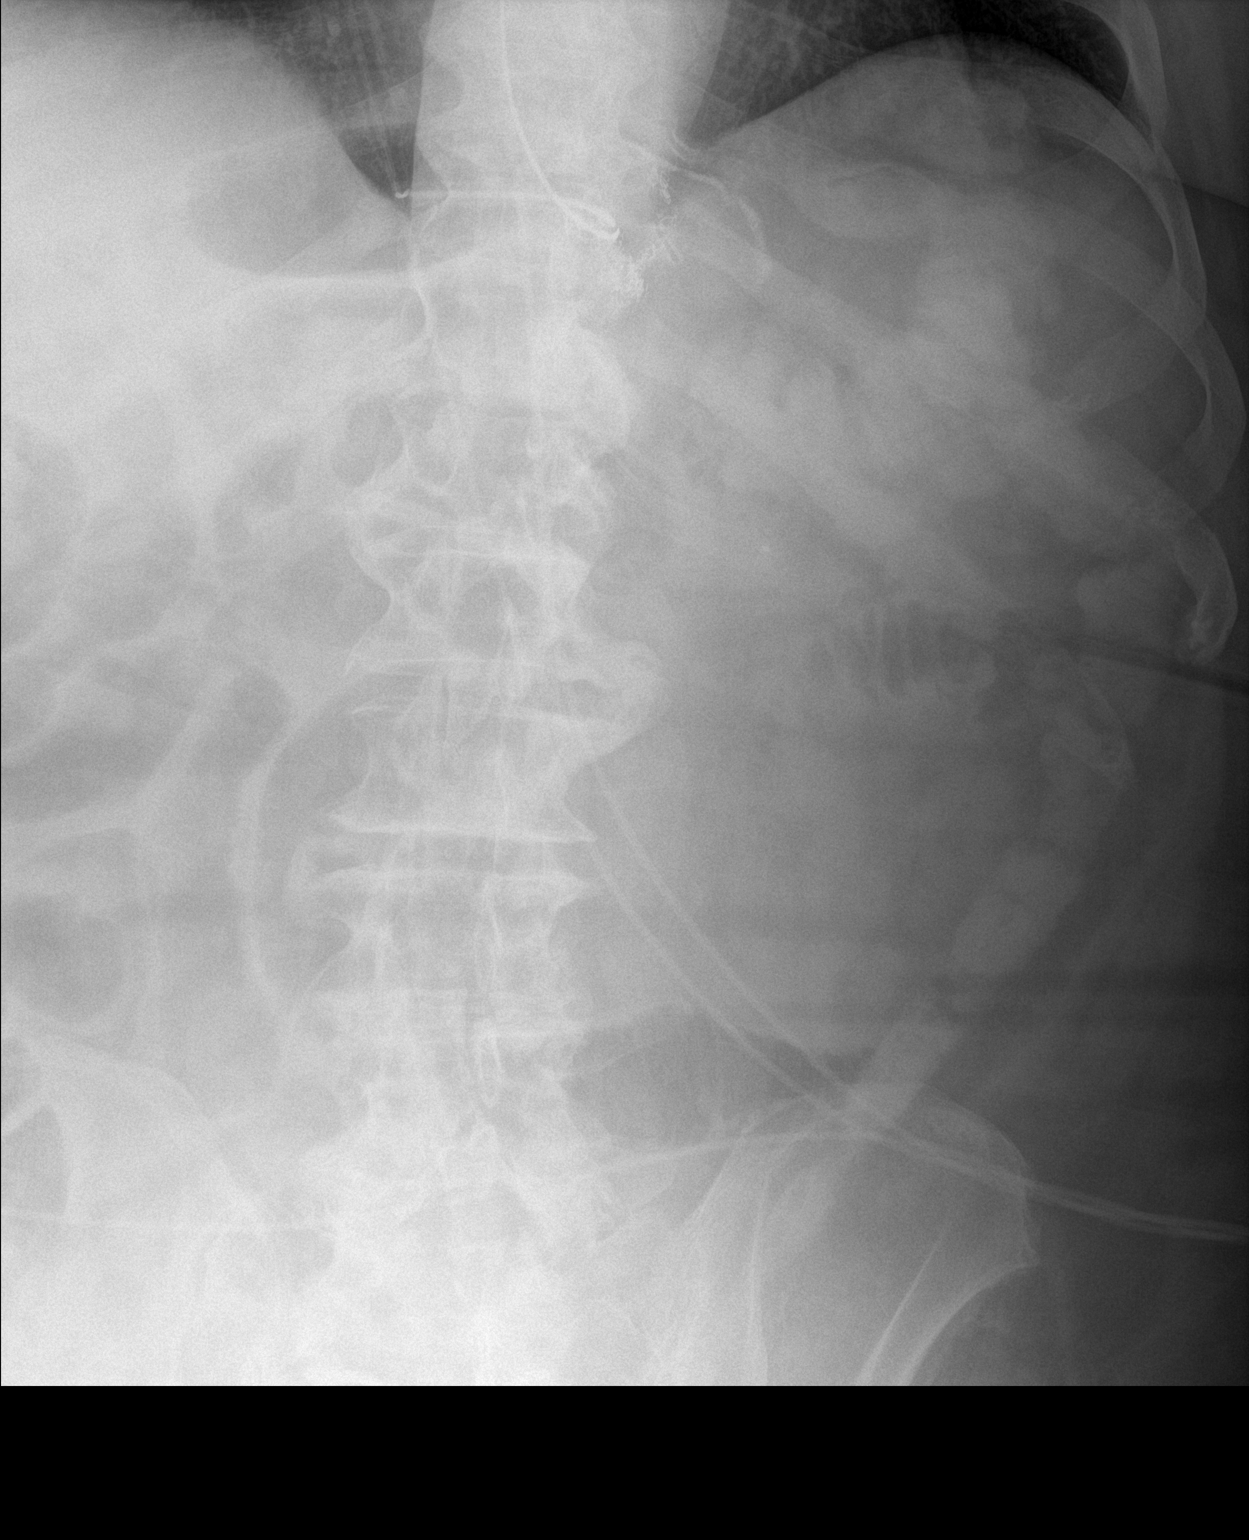

[1 of 1 positions shown; findings below may reference images not displayed]

FINDINGS: Nasogastric tube tip and side port are present in the proximal body
of the stomach. There are surgical clips at the gastroesophageal
junction. There is no bowel dilatation or air-fluid level to suggest
bowel obstruction. No free air.
IMPRESSION: Nasogastric tube tip and side port in proximal body of stomach.
Surgical clips at gastroesophageal junction. No bowel obstruction or
free air demonstrable.

## 2019-05-08 IMAGING — RF DG ABDOMEN 1V
2 series · 2 of 2 positions shown · non-contrast
Comparison: None.

FLUOROSCOPY TIME:  1 minute, 30 seconds.

CLINICAL DATA: Status post feeding tube placement.

EXAM:
ABDOMEN - 1 VIEW

[Series 1: fluoro_barium 1fps_bw · 0.18mm/px · 1 of 1 slices shown]
[im 1/1]
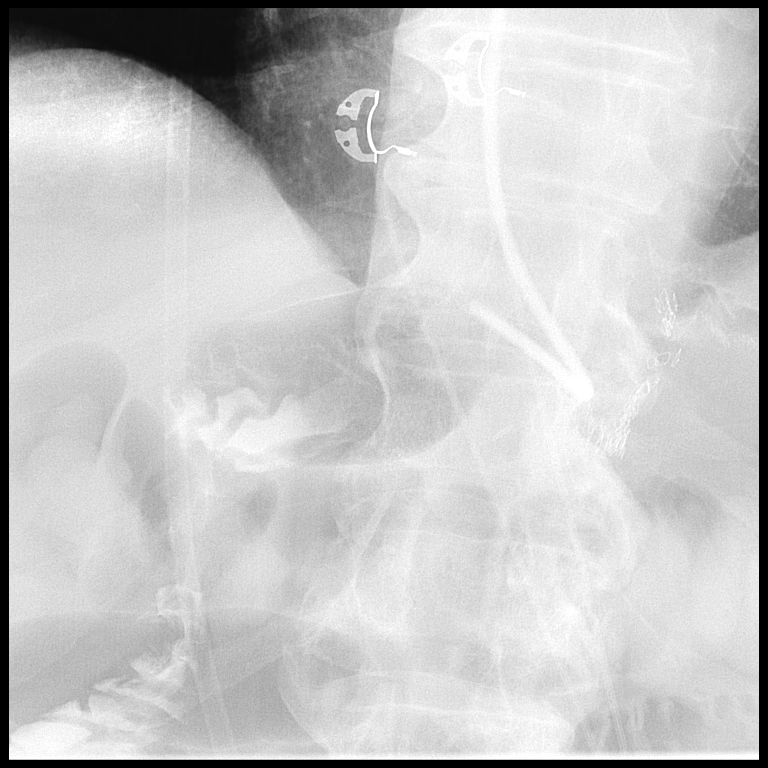

[Series 2: cp_standard · 0.17mm/px · 1 of 1 slices shown]
[im 1/1]
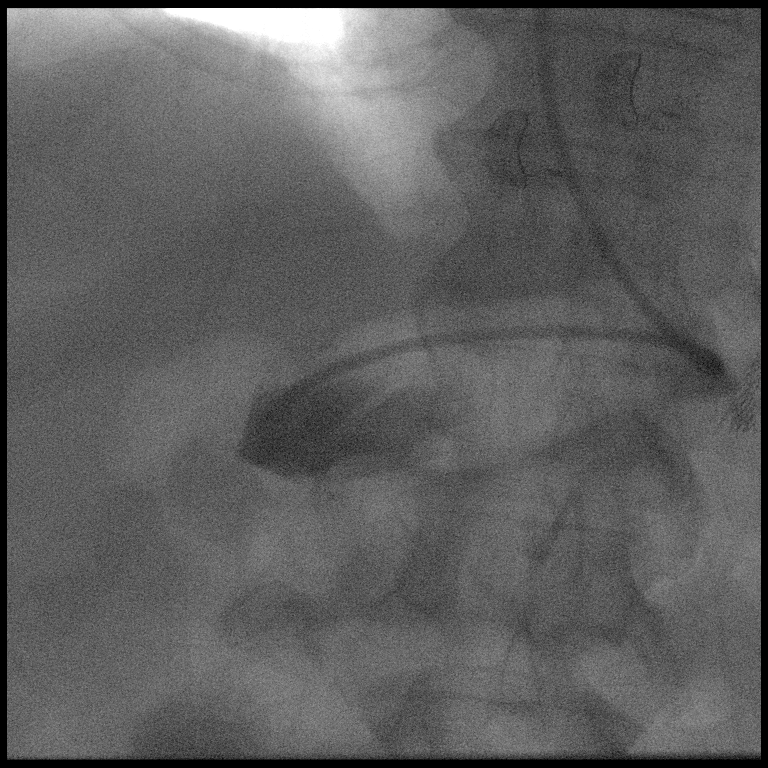

[2 of 2 positions shown; findings below may reference images not displayed]

FINDINGS: A feeding tube was placed under fluoroscopy by radiology
technologist BILLIOT with its tip left in the distal stomach.
IMPRESSION: As above.

## 2019-05-08 MED ORDER — SODIUM CHLORIDE 0.9 % IV SOLN
INTRAVENOUS | Status: DC
  Administered 2019-05-08 – 2019-05-17 (×10): via INTRAVENOUS

## 2019-05-08 MED ORDER — IOHEXOL 300 MG/ML  SOLN
20.0000 mL | Freq: Once | INTRAMUSCULAR | Status: AC | PRN
  Administered 2019-05-08: 20 mL

## 2019-05-08 MED ORDER — LIDOCAINE VISCOUS HCL 2 % MT SOLN
6.0000 mL | Freq: Once | OROMUCOSAL | Status: AC
  Administered 2019-05-08: 15:00:00 6 mL via OROMUCOSAL
  Filled 2019-05-08: qty 15

## 2019-05-08 MED ORDER — SODIUM CHLORIDE 0.9 % IV SOLN
2.0000 g | INTRAVENOUS | Status: AC
  Filled 2019-05-08: qty 2

## 2019-05-08 NOTE — Progress Notes (Signed)
STROKE TEAM PROGRESS NOTE   INTERVAL HISTORY (subjective) cortrak out yesterday. Cortrak team not in hospital today. RN to try NG placement; if unable, will place under fluoro.  Bariatric surgery has seen her and plan to do laparoscopic on Wednesday in order to decide how to place gastrostomy or jejunostomy tube in him.  He remains alert interactive and follows commands well.   Vitals:   05/08/19 0401 05/08/19 0500 05/08/19 0518 05/08/19 0749  BP: (!) 162/81   (!) 165/87  Pulse: 79   73  Resp: 19   20  Temp: (!) 97.4 F (36.3 C)   98.3 F (36.8 C)  TempSrc: Oral   Oral  SpO2: 90%  97% 100%  Weight:  (!) 148 kg    Height:        CBC:  Recent Labs  Lab 05/07/19 0331 05/08/19 0357  WBC 9.1 8.2  HGB 14.3 15.1  HCT 44.5 45.9  MCV 85.2 84.1  PLT 250 [id number]   Basic Metabolic Panel:  Recent Labs  Lab 05/03/19 0500 05/03/19 1646  05/05/19 2316 05/06/19 0321  NA 137  --    < > 135 134*  K 3.8  --    < > 3.8 3.7  CL 104  --    < > 100 98  CO2 23  --    < > 24 25  GLUCOSE 103*  --    < > 110* 103*  BUN 18  --    < > 22 21  CREATININE 0.68  --    < > 0.70 0.69  CALCIUM 8.9  --    < > 8.8* 8.8*  MG 1.8 1.9  --  1.8  --   PHOS 3.4 2.9  --   --   --    < > = values in this interval not displayed.   Lipid Panel:     Component Value Date/Time   CHOL 102 05/01/2019 0630   CHOL 142 12/19/2014 0804   TRIG 98 05/01/2019 0630   HDL 25 (L) 05/01/2019 0630   HDL 29 (L) 12/19/2014 0804   CHOLHDL 4.1 05/01/2019 0630   VLDL 20 05/01/2019 0630   LDLCALC 57 05/01/2019 0630   LDLCALC 93 12/19/2014 0804   HgbA1c:  Lab Results  Component Value Date   HGBA1C 5.5 05/01/2019   Urine Drug Screen:     Component Value Date/Time   LABOPIA NONE DETECTED 04/30/2019 1507   COCAINSCRNUR NONE DETECTED 04/30/2019 1507   COCAINSCRNUR Negative 03/15/2015 1141   LABBENZ NONE DETECTED 04/30/2019 1507   AMPHETMU POSITIVE (A) 04/30/2019 1507   THCU NONE DETECTED 04/30/2019 1507   LABBARB NONE  DETECTED 04/30/2019 1507    Alcohol Level     Component Value Date/Time   ETH <10 04/30/2019 1152    IMAGING past 24h Dg Abd Portable 1v  Result Date: 05/08/2019 CLINICAL DATA:  Nasogastric tube placement EXAM: PORTABLE ABDOMEN - 1 VIEW COMPARISON:  May 08, 2019 study obtained earlier in the day FINDINGS: Nasogastric tube tip and side port are present in the proximal body of the stomach. There are surgical clips at the gastroesophageal junction. There is no bowel dilatation or air-fluid level to suggest bowel obstruction. No free air. IMPRESSION: Nasogastric tube tip and side port in proximal body of stomach. Surgical clips at gastroesophageal junction. No bowel obstruction or free air demonstrable. Electronically Signed   By: Lowella Grip III M.D.   On: 05/08/2019 11:46   Dg Abd Portable  1v  Result Date: 05/08/2019 CLINICAL DATA:  63 year old [name] status post NG placement. EXAM: PORTABLE ABDOMEN - 1 VIEW COMPARISON:  None. FINDINGS: An enteric tube is partially visualized with tip and side-port in the region of the GE junction. The tube appears to be bent on itself distal to the side port. Evaluation and repositioning. Dilated small bowel noted in the lower abdomen measuring up to 4.7 cm. The osseous structures and soft tissues are unremarkable. IMPRESSION: 1. Enteric tube bent on itself with tip and side-port in the region of the GE junction. Evaluation and repositioning is recommended. 2. Dilated small bowel in the lower abdomen measuring up to 4.7 cm. Electronically Signed   By: Anner Crete M.D.   On: 05/08/2019 11:06    PHYSICAL EXAM   General - morbidly obese, well developed, not in acute distress.  Ophthalmologic - fundi not visualized due to noncooperation.  Cardiovascular - irregularly irregular heart rate and rhythm.  Neuro - awake alert, still anarthric, not able to answer orientation questions but able to write them out on a note bad, he is fully oriented..  Follows all simple commands PERRL, right gaze full but incomplete left gaze, blinking to visual threat bilaterally. Bilateral facial weakness but left seems more than right. Tongue protrusion difficulty. RUE 5/5, LUE 4+/5 proximal and 4/5 finger grip. RLE 5/5. LLE 4+/5 proximal and 5-/5 distal. DTR 1+ and no babinski. Sensation symmetrical. Bilateral FTN intact. Gait not tested.    ASSESSMENT/PLAN Kyle [name] is a 63 y.o. [name] with history of a. Fib ( not anticoagulated) sick sinus syndrom, HTN, CVA, OSA ( not on CPAP) presenting with right side gaze and left arm paralysis.   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 Probably hyperdense R MCA bifurcation. Old R temporal lobe and L frontal lobe infarcts. .     CTA head & neck suboptimal. Occlusion R MCA at bifurcation.  CT perfusion large delayed perfusion R MCA. No core infarct.   Cerebral angio occluded R M1, TICI2c   MRI  R MCA infarct (operculum). B MCA encephalomalacia  MRA  Improved R MCA patency  2D Echo EF 60-65%. No source of embolus   LDL 57   HgbA1c 5.5   HIV neg  Lovenox 40 mg sq daily for VTE prophylaxis  aspirin 81 mg daily prior to admission, now on IV heparin given possible PEG placement   Therapy recommendations:  CIR->family prefer SNF for longer term care.  Disposition:  Pending.  Bed obtained. Need to address swallow proir to d.c   Atrial Fibrillation  Home anticoagulation:  not taking   Started on Xarelto 09/15/2018, documented not taking 04/23/2019 (family practice note) . Now on IV heparin given possible PEG placement.   Bradycardia with pauses (2 seconds) - Dr Kyle [name] saw 11/20 - pt asymptomatic - currently rate in 70's - will decrease metoprolol dose.   Hypertension  Home meds:  Lisinopril 20 . Treated w/ cleviprex, now off. Labetalol prn  . BP goal < 180 . BP  stable now . On lisinopril 10 to bid and metoprolol 12.5 bid . Long-term BP goal normotensive  Hyperlipidemia  Home meds:  Prescribed lipitor - ? taking  LDL 57, goal < 70  Now on lipitor 40  Continue statin at discharge  Dysphagia . Secondary to stroke . NPO .  Speech on board . Has cortrak and TF . Trauma / IR cannot place PEG d/t gastroplasty in 1992. Bariatric GS saw today. For exp lap vs open G vs open J tube Tuesday or Wednesday . Need to address swallow plan prior to SNF placement  Tobacco abuse  Current smoker  Smoking cessation counseling provided  Pt is willing to quit   Other Stroke Risk Factors  ETOH use, alcohol level <10, advised to drink no more than 2 drink(s) a day  Substance abuse UDS:  Amphetamines  POSITIVE  Morbid Obesity, Body mass index is 40.78 kg/m., hx failed open gastroplasty in 1993, recommend weight loss, diet and exercise as appropriate   Hx stroke/TIA by imaging - Old R temporal lobe and L frontal lobe infarcts - likely due to AFib  Obstructive sleep apnea, not on CPAP at home  Other Active Problems  Leukocytosis, resolved.   COPD  Hypokalemia, resolved  Hospital day # 8  Patient continues to be an arthritic and dysphagia.  He will need a PEG tube but bariatric surgery will do that hopefully later this week.  He has pulled out feeding tube and bedside RN to try placing a small orogastric tube on nutrition and medications.  Discussed with patient and care team and answered questions.  Greater than 50% time during this 25-minute visit spent on counseling and coordination of care about his A. fib, stroke, dysphagia and answering questions Antony Contras, MD Medical Director [place] Pager: [phone] 05/08/2019 1:44 PM To contact Stroke Continuity provider, please refer to [email or web address] After hours, contact General Neurology

## 2019-05-08 NOTE — Progress Notes (Addendum)
Placed NGT 15F down right nares; xray shows malposition; attempted reposition second xray can not confirm use for tube feeding; paged MD; need NGT placed in radiology or to reposition for use.

## 2019-05-08 NOTE — Progress Notes (Signed)
SLP Cancellation Note  Patient Details Name: Kyle [name] MRN: [name]:[id number] DOB: [birth year]   Cancelled treatment:       Reason Eval/Treat Not Completed: Patient at procedure or test/unavailable; NG being placed at time of arrival.  Will continue efforts.  Cartier Washko L. Tivis Ringer, [place] CCC/SLP Acute Rehabilitation Services Office number [phone] Pager [phone]    Assunta Curtis 05/08/2019, 12:51 PM

## 2019-05-08 NOTE — Progress Notes (Signed)
Physical Therapy Treatment Patient Details Name: Kyle [name] MRN: [place]:[id number] DOB: 03/05/1956 Today's Date: 05/08/2019    History of Present Illness Kyle [name] is an 63 y.o. male  With PMH a. Fib ( not anticoagulated) sick sinus syndrom, HTN, CVA, OSA ( not on CPAP) who presented to [facility] as a code stroke. Pt found to have R MCA acute ischemic stroke due to right M1 occlusion, pt s/p mechanical thrombectomy.    PT Comments    Pt performed gt training and functional mobility during session as he reported via writing on paper that he needed to use the bathroom. He continues to fatigue quickly and required rest break between session.  Based on his function he continues to benefit from rehab in a post acute setting.  Will continue to follow during acute stay.      Follow Up Recommendations  CIR(based on denial will require SNF placement.)     Equipment Recommendations  None recommended by PT;Other (comment)(TBD)    Recommendations for Other Services       Precautions / Restrictions Precautions Precautions: Fall Precaution Comments: strong L lateral lean Restrictions Weight Bearing Restrictions: No    Mobility  Bed Mobility Overal bed mobility: Needs Assistance Bed Mobility: Supine to Sit     Supine to sit: Min guard     General bed mobility comments: pt able to pull to sitting with rail at min guard; increased difficulty 2/2 air mattress  Transfers Overall transfer level: Needs assistance Equipment used: Rolling walker (2 wheeled) Transfers: Sit to/from Stand Sit to Stand: Min guard         General transfer comment: Min guard for sit to stand from bed and from commode.  He had difficulty rising from low seated commde and use rail on bar and door to achieve standing.  Ambulation/Gait Ambulation/Gait assistance: Min guard Gait Distance (Feet): 12 Feet(x2) Assistive device: Rolling walker (2 wheeled)(wide) Gait Pattern/deviations: Step-through  pattern;Decreased stride length;Shuffle;Trunk flexed     General Gait Details: Pt able to progress gt in room to and from bathroom.  He rpesents with wide waddling BOS.   Stairs             Wheelchair Mobility    Modified Rankin (Stroke Patients Only)       Balance Overall balance assessment: Needs assistance Sitting-balance support: Feet supported;Single extremity supported Sitting balance-Leahy Scale: Good Sitting balance - Comments: Able to functionally reach with supervision   Standing balance support: Bilateral upper extremity supported;During functional activity Standing balance-Leahy Scale: Fair Standing balance comment: dependent on RW or physical assist                            Cognition Arousal/Alertness: Awake/alert Behavior During Therapy: WFL for tasks assessed/performed Overall Cognitive Status: Difficult to assess                                 General Comments: Pt writing on paper to communicate and responds to yes or no questions      Exercises      General Comments        Pertinent Vitals/Pain Pain Assessment: Faces Faces Pain Scale: Hurts even more Pain Location: groin area with sittng in chair Pain Descriptors / Indicators: Sore Pain Intervention(s): Monitored during session;Repositioned    Home Living  Prior Function            PT Goals (current goals can now be found in the care plan section) Acute Rehab PT Goals PT Goal Formulation: With patient Potential to Achieve Goals: Good Progress towards PT goals: Progressing toward goals    Frequency    Min 3X/week      PT Plan Current plan remains appropriate    Co-evaluation              AM-PAC PT "6 Clicks" Mobility   Outcome Measure  Help needed turning from your back to your side while in a flat bed without using bedrails?: A Little Help needed moving from lying on your back to sitting on the side of a  flat bed without using bedrails?: A Lot Help needed moving to and from a bed to a chair (including a wheelchair)?: A Little Help needed standing up from a chair using your arms (e.g., wheelchair or bedside chair)?: A Little Help needed to walk in hospital room?: A Little Help needed climbing 3-5 steps with a railing? : A Little 6 Click Score: 17    End of Session Equipment Utilized During Treatment: Gait belt Activity Tolerance: Patient tolerated treatment well Patient left: in chair;with call bell/phone within reach Nurse Communication: Mobility status PT Visit Diagnosis: Unsteadiness on feet (R26.81);Muscle weakness (generalized) (M62.81);Difficulty in walking, not elsewhere classified (R26.2);History of falling (Z91.81)     Time: [id number] PT Time Calculation (min) (ACUTE ONLY): 27 min  Charges:  $Gait Training: 8-22 mins $Therapeutic Exercise: 8-22 mins                     Erasmo Leventhal , PTA Acute Rehabilitation Services Pager [phone] Office [phone]     Kyle [name] 05/08/2019, 5:07 PM

## 2019-05-08 NOTE — Progress Notes (Signed)
8 Days Post-Op   Subjective/Chief Complaint: cortrak out   Objective: Vital signs in last 24 hours: Temp:  [97.4 F (36.3 C)-98.9 F (37.2 C)] 98.3 F (36.8 C) (11/23 0749) Pulse Rate:  [65-88] 73 (11/23 0749) Resp:  [10-20] 20 (11/23 0749) BP: (125-166)/(73-87) 165/87 (11/23 0749) SpO2:  [90 %-100 %] 100 % (11/23 0749) FiO2 (%):  [2 %] 2 % (11/23 0749) Weight:  [id number] kg] 148 kg (11/23 0500) Last BM Date: 04/30/19  Intake/Output from previous day: 11/22 0701 - 11/23 0700 In: -  Out: 2200 [Urine:2200] Intake/Output this shift: No intake/output data recorded.  General appearance: nonverbal Resp: unlabored GI: obese, nontender, midline laparotomy and laparoscopic port sites  Lab Results:  Recent Labs    05/07/19 0331 05/08/19 0357  WBC 9.1 8.2  HGB 14.3 15.1  HCT 44.5 45.9  PLT 250 264   BMET Recent Labs    05/05/19 2316 05/06/19 0321  NA 135 134*  K 3.8 3.7  CL 100 98  CO2 24 25  GLUCOSE 110* 103*  BUN 22 21  CREATININE 0.70 0.69  CALCIUM 8.8* 8.8*   PT/INR No results for input(s): LABPROT, INR in the last 72 hours. ABG No results for input(s): PHART, HCO3 in the last 72 hours.  Invalid input(s): PCO2, PO2  Studies/Results: No results found.  Anti-infectives: Anti-infectives (From admission, onward)   Start     Dose/Rate Route Frequency Ordered Stop   04/30/19 1227  ceFAZolin (ANCEF) 2-4 GM/100ML-% IVPB    Note to Pharmacy: Margaretmary Dys   : cabinet override      04/30/19 1227 05/01/19 0029      Assessment/Plan: Atrial fibrillation HTN H/o CVA Tobacco abuse OSA  Right MCA infarct s/p TICI 2c revascularization by IR 04/30/19 Dysphagia H/o failed gastroplasty in 1992, modified sleeve gastrectomy/ 3 hours of lysis of adhesions by Dr. Hassell Done in 2013 - Patient with complicated abdominal surgical history and the need for gastrostomy tube placement for tube feedings in the setting of acute stroke. Will plan diagnostic laparoscopy, lap vs open  G vs J tube depending on anatomy. Tomorrow or Wednesday. He is high risk of complications and periop mortality.   ID - none currently VTE - SCDs FEN - TF Foley - none Follow up - TBD  LOS: 8 days    Clovis Riley 05/08/2019

## 2019-05-08 NOTE — Progress Notes (Signed)
ANTICOAGULATION CONSULT NOTE - Follow up [facility] for heparin Indication: atrial fibrillation and stroke  Allergies  Allergen Reactions  . Vioxx [Rofecoxib] Rash    Patient Measurements: Height: 6\' 3"  (190.5 cm) Weight: (!) 326 lb 4.5 oz (148 kg) IBW/kg (Calculated) : 84.5 Heparin Dosing Weight: 121 kg  Vital Signs: Temp: 98.3 F (36.8 C) (11/23 0749) Temp Source: Oral (11/23 0749) BP: 165/87 (11/23 0749) Pulse Rate: 73 (11/23 0749)  Labs: Recent Labs    05/05/19 2316 05/06/19 0321 05/07/19 0331 05/08/19 0357  HGB  --   --  14.3 15.1  HCT  --   --  44.5 45.9  PLT  --   --  250 264  HEPARINUNFRC  --  0.55 0.42 0.40  CREATININE 0.70 0.69  --   --     Estimated Creatinine Clearance: 150.7 mL/min (by C-G formula based on SCr of 0.69 mg/dL).  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 Hx of Afib with stroke likely d/t not being on anticoagulation.  Planning possible PEG 11/24 or 11/25.   Heparin level this morning remains therapeutic (HL 0.4 << 0.42, goal of 0.3-0.5). CBC wnl - no bleeding noted.   Goal of Therapy:  Heparin level 0.3 - 0.5 units/ml Monitor platelets by anticoagulation protocol: Yes   Plan:  Continue Heparin gtt to 1800 units/hr  Daily heparin level, CBC F/U plans for possible PEG, conversion to oral anticoagulant  Thank you for allowing pharmacy to be a part of this patient's care.  Alycia Rossetti, PharmD, BCPS Clinical Pharmacist Clinical phone for 05/08/2019: [phone] 05/08/2019 11:12 AM   **Pharmacist phone directory can now be found on [email or web address] ([email or web address]).  Listed under [place].

## 2019-05-09 LAB — CBC
HCT: 45.3 % (ref 39.0–52.0)
Hemoglobin: 15 g/dL (ref 13.0–17.0)
MCH: 27.6 pg (ref 26.0–34.0)
MCHC: 33.1 g/dL (ref 30.0–36.0)
MCV: 83.4 fL (ref 80.0–100.0)
Platelets: 273 10*3/uL (ref 150–400)
RBC: 5.43 MIL/uL (ref 4.22–5.81)
RDW: 15.2 % (ref 11.5–15.5)
WBC: 8.7 10*3/uL (ref 4.0–10.5)
nRBC: 0 % (ref 0.0–0.2)

## 2019-05-09 LAB — GLUCOSE, CAPILLARY
Glucose-Capillary: 104 mg/dL — ABNORMAL HIGH (ref 70–99)
Glucose-Capillary: 106 mg/dL — ABNORMAL HIGH (ref 70–99)
Glucose-Capillary: 114 mg/dL — ABNORMAL HIGH (ref 70–99)
Glucose-Capillary: 83 mg/dL (ref 70–99)
Glucose-Capillary: 94 mg/dL (ref 70–99)
Glucose-Capillary: 95 mg/dL (ref 70–99)

## 2019-05-09 LAB — URINALYSIS, ROUTINE W REFLEX MICROSCOPIC

## 2019-05-09 LAB — URINALYSIS, MICROSCOPIC (REFLEX): RBC / HPF: >50 RBC/hpf (ref 0–5)

## 2019-05-09 LAB — HEPARIN LEVEL (UNFRACTIONATED): Heparin Unfractionated: <0.1 IU/mL — ABNORMAL LOW (ref 0.30–0.70)

## 2019-05-09 NOTE — Plan of Care (Signed)
  Problem: Education: Goal: Knowledge of General Education information will improve Description Including pain rating scale, medication(s)/side effects and non-pharmacologic comfort measures Outcome: Progressing   Problem: Health Behavior/Discharge Planning: Goal: Ability to manage health-related needs will improve Outcome: Progressing   Problem: Clinical Measurements: Goal: Ability to maintain clinical measurements within normal limits will improve Outcome: Progressing Goal: Will remain free from infection Outcome: Progressing Goal: Diagnostic test results will improve Outcome: Progressing Goal: Respiratory complications will improve Outcome: Progressing Goal: Cardiovascular complication will be avoided Outcome: Progressing   Problem: Activity: Goal: Risk for activity intolerance will decrease Outcome: Progressing   Problem: Nutrition: Goal: Adequate nutrition will be maintained Outcome: Progressing   Problem: Coping: Goal: Level of anxiety will decrease Outcome: Progressing   Problem: Elimination: Goal: Will not experience complications related to bowel motility Outcome: Progressing Goal: Will not experience complications related to urinary retention Outcome: Progressing   Problem: Pain Managment: Goal: General experience of comfort will improve Outcome: Progressing   Problem: Safety: Goal: Ability to remain free from injury will improve Outcome: Progressing   Problem: Skin Integrity: Goal: Risk for impaired skin integrity will decrease Outcome: Progressing   Problem: Education: Goal: Knowledge of disease or condition will improve Outcome: Progressing Goal: Knowledge of secondary prevention will improve Outcome: Progressing Goal: Knowledge of patient specific risk factors addressed and post discharge goals established will improve Outcome: Progressing Goal: Individualized Educational Video(s) Outcome: Progressing   Problem: Coping: Goal: Will verbalize  positive feelings about self Outcome: Progressing Goal: Will identify appropriate support needs Outcome: Progressing   Problem: Health Behavior/Discharge Planning: Goal: Ability to manage health-related needs will improve Outcome: Progressing   Problem: Self-Care: Goal: Ability to participate in self-care as condition permits will improve Outcome: Progressing Goal: Verbalization of feelings and concerns over difficulty with self-care will improve Outcome: Progressing Goal: Ability to communicate needs accurately will improve Outcome: Progressing   Problem: Nutrition: Goal: Risk of aspiration will decrease Outcome: Progressing Goal: Dietary intake will improve Outcome: Progressing   Problem: Ischemic Stroke/TIA Tissue Perfusion: Goal: Complications of ischemic stroke/TIA will be minimized Outcome: Progressing  Jacoba Cherney, BSN, RN 

## 2019-05-09 NOTE — Progress Notes (Signed)
Spoke with Dr. Lorraine Lax, V.O. ok to give meds via NGT.

## 2019-05-09 NOTE — Progress Notes (Signed)
  Speech Language Pathology Treatment: Dysphagia;Cognitive-Linquistic  Patient Details Name: Kyle [name] MRN: [place]:[id number] DOB: March 10, 1956 Today's Date: 05/09/2019 Time: [id number] SLP Time Calculation (min) (ACUTE ONLY): 36 min  Assessment / Plan / Recommendation Clinical Impression  Pt very motivated to work with ST on swallow and speech. Communicates with gestures or writing. Although minimal, there are improvements in volitional mandibular, lingual and labial ROM. He can almost approximate lips; tongue tip now reaches lower dentition. Functionally we are working toward generalization into speech and swallow. Decreased oral secretions. Mild and brief lingual movement with single ice chip presentation- melts and increased frequency of swallows observed with and appearance of subjective mild improvements in strength. Recommend several ice chips every 4-6 hours or so to provide moisture and use of oropharyngeal musculature.   Imitation of /ah/ is very forced and effortful and appears to strain/tense during attempts. Introduced strategy to use an easier onset with phonation. Loud and audible vocalization during coughs and throat clears. Have used music in prior session to initiate oral movement and/or vocalization ( will attempt next session).   HPI HPI: Kyle [name] is a 64 y.o. M with hx of CVA, sleep apnea, SOB, peripheral vascular disease, HPT, GERD, gastric bypass surgery(2013), atrial fibrillation, arthritis, tobacco use, and ETOH admitted 11/15 after friend found sitting in car, confused. Head CT Showed hyperdense R MCA bifurcation, no acute infarct and atrophy with chronic temporal infarct / chronic L frontal infarct. MRI showed R MCA restricuted diffusion primarily involving operculum. No associated hemorrhage or mass efect. Underlying bilateral MCA chronic encephalomalacia. Briefly intubated 11/15 for arteriogram. Recently admitted to ED 11/06 for fall with R ankle sprain and 11/08 for  another fall, NR reported pt is chronic opiate user due to chronic back pain. Diet currently NPO.       SLP Plan  Continue with current plan of care       Recommendations  Diet recommendations: NPO Medication Administration: Via alternative means                Oral Care Recommendations: Oral care QID Follow up Recommendations: Inpatient Rehab SLP Visit Diagnosis: Dysphagia, unspecified (R13.10);Dysarthria and anarthria (R47.1) Plan: Continue with current plan of care       Cuming, Alyvia Derk Willis 05/09/2019, 12:55 PM  Orbie Pyo Colvin Caroli.Ed Risk analyst [phone] Office [phone]

## 2019-05-09 NOTE — Progress Notes (Signed)
[facility]/Trauma Progress Note  9 Days Post-Op   Assessment/Plan Atrial fibrillation HTN H/o CVA Tobacco abuse OSA  Right MCAinfarcts/pTICI 2c revascularizationby IR11/15/20 Dysphagia H/o failed gastroplasty in 1992, modified sleeve gastrectomy/ 3 hours of lysis of adhesions by Dr. Hassell Done in 2013 - Patient with complicated abdominal surgical history and the need for gastrostomy tube placement for tube feedings in the setting of acute stroke. Will plan diagnostic laparoscopy, lap vs open G vs J tube depending on anatomy. Tomorrow or Wednesday. He is high risk of complications and periop mortality.   ID -none currently VTE -SCDs, heparin on hold FEN -TF on hold Foley -none Follow up -TBD   LOS: 9 days    Subjective: CC: blood in urine  Pt is able to write questions and appears to comprehend what I am saying. He is not sure he is up for surgery. He is not sure he will be able to handle it. He wants to know what will happen if he does not have a feeding tube placed. I informed the patient that some facilities will not accept the cortrak he has so it may affect his placement.   Objective: Vital signs in last 24 hours: Temp:  [97.5 F (36.4 C)-98.9 F (37.2 C)] 98 F (36.7 C) (11/24 1139) Pulse Rate:  [59-100] 74 (11/24 1139) Resp:  [17-20] 17 (11/24 1139) BP: (146-181)/(73-92) 146/80 (11/24 1139) SpO2:  [89 %-96 %] 95 % (11/24 1139) Weight:  [id number] kg] 139 kg (11/24 0419) Last BM Date: 05/09/19  Intake/Output from previous day: 11/23 0701 - 11/24 0700 In: 1050 [I.V.:1050] Out: 675 [Urine:675] Intake/Output this shift: No intake/output data recorded.  PE:  Gen:  Alert, NAD, nonverbal  Pulm:  Rate and effort normal Abd: obese, not distended Skin: warm and dry   Anti-infectives: Anti-infectives (From admission, onward)   Start     Dose/Rate Route Frequency Ordered Stop   05/09/19 0600  cefoTEtan (CEFOTAN) 2 g in sodium chloride 0.9 % 100  mL IVPB     2 g 200 mL/hr over 30 Minutes Intravenous On call to O.R. 05/08/19 1533 05/10/19 0559   04/30/19 1227  ceFAZolin (ANCEF) 2-4 GM/100ML-% IVPB    Note to Pharmacy: Margaretmary Dys   : cabinet override      04/30/19 1227 05/01/19 0029      Lab Results:  Recent Labs    05/08/19 0357 05/09/19 0329  WBC 8.2 8.7  HGB 15.1 15.0  HCT 45.9 45.3  PLT 264 273   BMET No results for input(s): NA, K, CL, CO2, GLUCOSE, BUN, CREATININE, CALCIUM in the last 72 hours. PT/INR No results for input(s): LABPROT, INR in the last 72 hours. CMP     Component Value Date/Time   NA 134 (L) 05/06/2019 0321   NA 139 12/19/2014 0804   K 3.7 05/06/2019 0321   CL 98 05/06/2019 0321   CO2 25 05/06/2019 0321   GLUCOSE 103 (H) 05/06/2019 0321   BUN 21 05/06/2019 0321   BUN 16 12/19/2014 0804   CREATININE 0.69 05/06/2019 0321   CREATININE 0.75 06/20/2011 1032   CALCIUM 8.8 (L) 05/06/2019 0321   PROT 7.2 04/30/2019 1152   PROT 6.9 12/19/2014 0804   ALBUMIN 3.5 04/30/2019 1152   ALBUMIN 4.2 12/19/2014 0804   AST 24 04/30/2019 1152   ALT 16 04/30/2019 1152   ALKPHOS 77 04/30/2019 1152   BILITOT 1.0 04/30/2019 1152   BILITOT 0.8 12/19/2014 0804   GFRNONAA >60 05/06/2019 0321  GFRAA >60 05/06/2019 0321   Lipase     Component Value Date/Time   LIPASE 28 04/23/2019 1734    Studies/Results: Dg Abd 1 View  Result Date: 05/08/2019 CLINICAL DATA:  Status post feeding tube placement. EXAM: ABDOMEN - 1 VIEW COMPARISON:  None. FLUOROSCOPY TIME:  1 minute, 30 seconds. FINDINGS: A feeding tube was placed under fluoroscopy by radiology technologist Elvera Lennox with its tip left in the distal stomach. IMPRESSION: As above. Electronically Signed   By: Inge Rise M.D.   On: 05/08/2019 15:30   Dg Abd Portable 1v  Result Date: 05/08/2019 CLINICAL DATA:  Nasogastric tube placement EXAM: PORTABLE ABDOMEN - 1 VIEW COMPARISON:  May 08, 2019 study obtained earlier in the day FINDINGS:  Nasogastric tube tip and side port are present in the proximal body of the stomach. There are surgical clips at the gastroesophageal junction. There is no bowel dilatation or air-fluid level to suggest bowel obstruction. No free air. IMPRESSION: Nasogastric tube tip and side port in proximal body of stomach. Surgical clips at gastroesophageal junction. No bowel obstruction or free air demonstrable. Electronically Signed   By: Lowella Grip III M.D.   On: 05/08/2019 11:46   Dg Abd Portable 1v  Result Date: 05/08/2019 CLINICAL DATA:  63 year old male status post NG placement. EXAM: PORTABLE ABDOMEN - 1 VIEW COMPARISON:  None. FINDINGS: An enteric tube is partially visualized with tip and side-port in the region of the GE junction. The tube appears to be bent on itself distal to the side port. Evaluation and repositioning. Dilated small bowel noted in the lower abdomen measuring up to 4.7 cm. The osseous structures and soft tissues are unremarkable. IMPRESSION: 1. Enteric tube bent on itself with tip and side-port in the region of the GE junction. Evaluation and repositioning is recommended. 2. Dilated small bowel in the lower abdomen measuring up to 4.7 cm. Electronically Signed   By: Anner Crete M.D.   On: 05/08/2019 11:06     Kalman Drape, [facility] Please see amion for pager for the following: Cristine Polio, & Friday 7:00am - 4:30pm Thursdays 7:00am -11:30am

## 2019-05-09 NOTE — Progress Notes (Signed)
Physical Therapy Treatment Patient Details Name: Kyle [name] MRN: [place]:[id number] DOB: [birth year] Today's Date: 05/09/2019    History of Present Illness Kyle [name] is an 63 y.o. male  With PMH a. Fib ( not anticoagulated) sick sinus syndrom, HTN, CVA, OSA ( not on CPAP) who presented to [facility] as a code stroke. Pt found to have R MCA acute ischemic stroke due to right M1 occlusion, pt s/p mechanical thrombectomy.    PT Comments    Pt supine in bed and eager to mobilize.  He moves impulsively and required min guard assistance overall.  Pt required close chair follow.  Plan next session for progression of gt as tolerated.  Continue to recommend rehab in a post acute setting.       Follow Up Recommendations  CIR(based on denial will require SNF placement)     Equipment Recommendations  None recommended by PT;Other (comment)(TBD)    Recommendations for Other Services Rehab consult     Precautions / Restrictions Precautions Precautions: Fall Precaution Comments: strong L lateral lean Restrictions Weight Bearing Restrictions: No    Mobility  Bed Mobility Overal bed mobility: Needs Assistance Bed Mobility: Supine to Sit     Supine to sit: Supervision     General bed mobility comments: Managament needed for lines and leads.  Transfers Overall transfer level: Needs assistance Equipment used: Rolling walker (2 wheeled)(wide BOS) Transfers: Sit to/from Stand Sit to Stand: Min guard         General transfer comment: Cues for hand placement to elevate into standing.  Pt required increased time to elevate and achieve standing but not physical assistance needed,  Ambulation/Gait Ambulation/Gait assistance: Min guard Gait Distance (Feet): 12 Feet Assistive device: Rolling walker (2 wheeled) Gait Pattern/deviations: Step-through pattern;Decreased stride length;Shuffle;Trunk flexed     General Gait Details: Pt ambulated from bed to door way.  He presents with hips ER and  wide waddling BOS.  Close chair follow as he fatigues quickly.   Stairs             Wheelchair Mobility    Modified Rankin (Stroke Patients Only) Modified Rankin (Stroke Patients Only) Pre-Morbid Rankin Score: No significant disability Modified Rankin: Moderately severe disability     Balance Overall balance assessment: Needs assistance Sitting-balance support: Feet supported;Single extremity supported Sitting balance-Leahy Scale: Good Sitting balance - Comments: Able to functionally reach with supervision     Standing balance-Leahy Scale: Fair Standing balance comment: dependent on RW or physical assist                            Cognition Arousal/Alertness: Awake/alert Behavior During Therapy: WFL for tasks assessed/performed Overall Cognitive Status: Difficult to assess                                 General Comments: Pt continues to write on paper to communicate which makes assesing cognition difficult.      Exercises      General Comments        Pertinent Vitals/Pain Pain Assessment: Faces Faces Pain Scale: Hurts even more Pain Location: L knee with weight bearing and movement Pain Descriptors / Indicators: Sore Pain Intervention(s): Monitored during session;Repositioned    Home Living                      Prior Function  PT Goals (current goals can now be found in the care plan section) Acute Rehab PT Goals PT Goal Formulation: With patient Potential to Achieve Goals: Good Progress towards PT goals: Progressing toward goals    Frequency    Min 3X/week      PT Plan Current plan remains appropriate    Co-evaluation              AM-PAC PT "6 Clicks" Mobility   Outcome Measure  Help needed turning from your back to your side while in a flat bed without using bedrails?: A Little Help needed moving from lying on your back to sitting on the side of a flat bed without using bedrails?: A  Little Help needed moving to and from a bed to a chair (including a wheelchair)?: A Little Help needed standing up from a chair using your arms (e.g., wheelchair or bedside chair)?: A Little Help needed to walk in hospital room?: A Little Help needed climbing 3-5 steps with a railing? : A Little 6 Click Score: 18    End of Session Equipment Utilized During Treatment: Gait belt Activity Tolerance: Patient tolerated treatment well Patient left: in chair;with call bell/phone within reach;with chair alarm set Nurse Communication: Mobility status PT Visit Diagnosis: Unsteadiness on feet (R26.81);Muscle weakness (generalized) (M62.81);Difficulty in walking, not elsewhere classified (R26.2);History of falling (Z91.81)     Time: [id number] PT Time Calculation (min) (ACUTE ONLY): 25 min  Charges:  $Gait Training: 8-22 mins $Therapeutic Activity: 8-22 mins                     Erasmo Leventhal , PTA Acute Rehabilitation Services Pager [phone] Office [phone]     Haidee Stogsdill Eli Hose 05/09/2019, 5:26 PM

## 2019-05-09 NOTE — Progress Notes (Addendum)
Nutrition Follow-up  DOCUMENTATION CODES:   Morbid obesity  INTERVENTION:  As able, recommend resuming Jevity 1.2 @ 64m/hr per MD  Pro-stat 344mTID This provides Provides 2460 kcal, 145 gm protein, 1458 ml free water daily  NUTRITION DIAGNOSIS:   Inadequate oral intake related to dysphagia as evidenced by NPO status.; ongoing  GOAL:   Patient will meet greater than or equal to 90% of their needs- met once TF resumes  MONITOR:   TF tolerance, Skin, Labs, Diet advancement  REASON FOR ASSESSMENT:   Consult Enteral/tube feeding initiation and management  ASSESSMENT:   [age]o male admitted with R MCA infarct due to R MCA occlusion. S/P revascularization. PMH includes A fib, GERD, HTN, PVD (venous insufficiency), CVA, sleep apnea.  SLP following for swallowing needs, was evaluating pt at time of follow-up. Patient is anarthric. He is not ready for PO's at this time, high aspiration risk, remains NPO. TF held on 11/23 for surgery scheduled for 11/24 PEG placement. Patient was able to nod yes/no to answer RD questions, no report of gastric discomfort or emesis since TF initiation. Per documented wt history, pt has downward trending weights, RD will monitor weight trends.  11/17 TF initiated 11/22 Cortrak tube removed by pt 11/23 NG tube placed via fluoroscopy  Labs and medications reviewed.  Diet Order:   Diet Order            Diet NPO time specified  Diet effective now              EDUCATION NEEDS:   Not appropriate for education at this time  Skin:  Skin Assessment: Reviewed RN Assessment(MASD to abdomen, chest, & buttocks)  Last BM:  11/24  Height:   Ht Readings from Last 1 Encounters:  05/04/19 _0  (1.905 m)    Weight:   Wt Readings from Last 1 Encounters:  05/09/19 (!) 139 kg    Ideal Body Weight:  89.1 kg  BMI:  Body mass index is 38.3 kg/m.  Estimated Nutritional Needs:   Kcal:  2300-2500  Protein:  140-160 gm  Fluid:  >/= 2.5  L  LaMeda KlinefelterDietetic Intern

## 2019-05-09 NOTE — Progress Notes (Signed)
HEPARIN IV REMAINS ON HOLD PENDING SURGERY IN AM AND SECONDARY TO URETHRAL BLEEDING EARLIER TODAY.

## 2019-05-09 NOTE — Progress Notes (Signed)
STROKE TEAM PROGRESS NOTE   INTERVAL HISTORY (subjective) Patient awaiting laparoscopic surgery for feeding tube.  No change in his condition.  He is n.p.o.  He has a small nasal gastric tube inserted yesterday by interventional radiology.  Vitals:   05/08/19 2323 05/09/19 0403 05/09/19 0419 05/09/19 0825  BP: (!) 146/85 (!) 158/80  (!) 181/73  Pulse: 75 63  77  Resp: 19 20  20   Temp: 97.9 F (36.6 C) 98.9 F (37.2 C)  (!) 97.5 F (36.4 C)  TempSrc: Oral   Oral  SpO2: (!) 89% 95%  95%  Weight:   (!) 139 kg   Height:        CBC:  Recent Labs  Lab 05/08/19 0357 05/09/19 0329  WBC 8.2 8.7  HGB 15.1 15.0  HCT 45.9 45.3  MCV 84.1 83.4  PLT 264 [id number]   Basic Metabolic Panel:  Recent Labs  Lab 05/03/19 0500 05/03/19 1646  05/05/19 2316 05/06/19 0321  NA 137  --    < > 135 134*  K 3.8  --    < > 3.8 3.7  CL 104  --    < > 100 98  CO2 23  --    < > 24 25  GLUCOSE 103*  --    < > 110* 103*  BUN 18  --    < > 22 21  CREATININE 0.68  --    < > 0.70 0.69  CALCIUM 8.9  --    < > 8.8* 8.8*  MG 1.8 1.9  --  1.8  --   PHOS 3.4 2.9  --   --   --    < > = values in this interval not displayed.   Lipid Panel:     Component Value Date/Time   CHOL 102 05/01/2019 0630   CHOL 142 12/19/2014 0804   TRIG 98 05/01/2019 0630   HDL 25 (L) 05/01/2019 0630   HDL 29 (L) 12/19/2014 0804   CHOLHDL 4.1 05/01/2019 0630   VLDL 20 05/01/2019 0630   LDLCALC 57 05/01/2019 0630   LDLCALC 93 12/19/2014 0804   HgbA1c:  Lab Results  Component Value Date   HGBA1C 5.5 05/01/2019   Urine Drug Screen:     Component Value Date/Time   LABOPIA NONE DETECTED 04/30/2019 1507   COCAINSCRNUR NONE DETECTED 04/30/2019 1507   COCAINSCRNUR Negative 03/15/2015 1141   LABBENZ NONE DETECTED 04/30/2019 1507   AMPHETMU POSITIVE (A) 04/30/2019 1507   THCU NONE DETECTED 04/30/2019 1507   LABBARB NONE DETECTED 04/30/2019 1507    Alcohol Level     Component Value Date/Time   ETH <10 04/30/2019 1152     IMAGING past 24h Dg Abd 1 View  Result Date: 05/08/2019 CLINICAL DATA:  Status post feeding tube placement. EXAM: ABDOMEN - 1 VIEW COMPARISON:  None. FLUOROSCOPY TIME:  1 minute, 30 seconds. FINDINGS: A feeding tube was placed under fluoroscopy by radiology technologist Elvera Lennox with its tip left in the distal stomach. IMPRESSION: As above. Electronically Signed   By: Inge Rise M.D.   On: 05/08/2019 15:30   Dg Abd Portable 1v  Result Date: 05/08/2019 CLINICAL DATA:  Nasogastric tube placement EXAM: PORTABLE ABDOMEN - 1 VIEW COMPARISON:  May 08, 2019 study obtained earlier in the day FINDINGS: Nasogastric tube tip and side port are present in the proximal body of the stomach. There are surgical clips at the gastroesophageal junction. There is no bowel dilatation or air-fluid level to  suggest bowel obstruction. No free air. IMPRESSION: Nasogastric tube tip and side port in proximal body of stomach. Surgical clips at gastroesophageal junction. No bowel obstruction or free air demonstrable. Electronically Signed   By: Lowella Grip III M.D.   On: 05/08/2019 11:46   Dg Abd Portable 1v  Result Date: 05/08/2019 CLINICAL DATA:  63 year old male status post NG placement. EXAM: PORTABLE ABDOMEN - 1 VIEW COMPARISON:  None. FINDINGS: An enteric tube is partially visualized with tip and side-port in the region of the GE junction. The tube appears to be bent on itself distal to the side port. Evaluation and repositioning. Dilated small bowel noted in the lower abdomen measuring up to 4.7 cm. The osseous structures and soft tissues are unremarkable. IMPRESSION: 1. Enteric tube bent on itself with tip and side-port in the region of the GE junction. Evaluation and repositioning is recommended. 2. Dilated small bowel in the lower abdomen measuring up to 4.7 cm. Electronically Signed   By: Anner Crete M.D.   On: 05/08/2019 11:06    PHYSICAL EXAM     General - morbidly obese, well  developed, not in acute distress.  Ophthalmologic - fundi not visualized due to noncooperation.  Cardiovascular - irregularly irregular heart rate and rhythm.  Neuro - awake alert, still anarthric, not able to answer orientation questions but able to write them out on a note bad, he is fully oriented.. Follows all simple commands PERRL, right gaze full but incomplete left gaze, blinking to visual threat bilaterally. Bilateral facial weakness but left seems more than right. Tongue protrusion difficulty. RUE 5/5, LUE 4+/5 proximal and 4/5 finger grip. RLE 5/5. LLE 4+/5 proximal and 5-/5 distal. DTR 1+ and no babinski. Sensation symmetrical. Bilateral FTN intact. Gait not tested.    ASSESSMENT/PLAN Mr. JANAI WEEK is a 63 y.o. male with history of a. Fib ( not anticoagulated) sick sinus syndrom, HTN, CVA, OSA ( not on CPAP) presenting with right side gaze and left arm paralysis.   Stroke:   R MCA infarct due to right MCA occlusion s/p IR w/ TICI2c revascularization -  Embolic econdary to known AF not on [facility]  Code Stroke CT head No acute abnormality. Probably hyperdense R MCA bifurcation. Old R temporal lobe and L frontal lobe infarcts. .     CTA head & neck suboptimal. Occlusion R MCA at bifurcation.  CT perfusion large delayed perfusion R MCA. No core infarct.   Cerebral angio occluded R M1, TICI2c   MRI  R MCA infarct (operculum). B MCA encephalomalacia  MRA  Improved R MCA patency  2D Echo EF 60-65%. No source of embolus   LDL 57   HgbA1c 5.5   HIV neg  Lovenox 40 mg sq daily for VTE prophylaxis  aspirin 81 mg daily prior to admission, off IV heparin overnight d/t bleeding from penis. Hold antiplatelets as well pending OR.  Therapy recommendations:  CIR->family prefer SNF for longer term care.  Disposition:  Pending.  Bed obtained. Need to address swallow proir to d.c   Atrial Fibrillation / SSS  Home anticoagulation:  not taking   Started on Xarelto 09/15/2018,  documented not taking 04/23/2019 (family practice note) . Now off IV heparin overnight d/t bleeding from penis. Hold antiplatelets as well pending OR.  Bradycardia with pauses (2 seconds) - Dr Rory Percy saw 11/20 - pt asymptomatic - currently rate in 70's - will decrease metoprolol dose.   Hypertension  Home meds:  Lisinopril 20 . Treated w/ cleviprex,  now off. Labetalol prn  . BP goal < 180 . BP stable now . On lisinopril 10 to bid and metoprolol 12.5 bid . Long-term BP goal normotensive  Hyperlipidemia  Home meds:  Prescribed lipitor - ? taking  LDL 57, goal < 70  Now on lipitor 40  Continue statin at discharge  Dysphagia . Secondary to stroke . NPO . Speech on board . cortrak out and replaced w/ panda under fluoro.  . Need to address swallow plan prior to SNF placement . TF on hold for OR at 1300 . Trauma / IR cannot place PEG d/t gastroplasty in 1992. Bariatric GS saw . For exp lap vs open G vs open J tube Tuesday.  Tobacco abuse  Current smoker  Smoking cessation counseling provided  Pt is willing to quit   Other Stroke Risk Factors  ETOH use, alcohol level <10, advised to drink no more than 2 drink(s) a day  Substance abuse UDS:  Amphetamines  POSITIVE  Morbid Obesity, Body mass index is 38.3 kg/m., hx failed open gastroplasty in 1993, recommend weight loss, diet and exercise as appropriate   Hx stroke/TIA by imaging - Old R temporal lobe and L frontal lobe infarcts - likely due to AFib  Obstructive sleep apnea, not on CPAP at home  PVD  Other Active Problems  Leukocytosis, resolved.   COPD  Hypokalemia, resolved  Chronic pain syndrome, chronic B LBP w/ R sided sciatica, Pain from recent fallo narcotics at home. Followed at [facility]  Adjustment d/o w/ mixed anxiety and depressed mood  Bleeding from penis. No hx foley. IV heparin off.  Hospital day # 9 Patient is n.p.o. for laparoscopic surgery today.   Hopefully he will get up surgical PEG tube.  No neurological changes.  Antony Contras, MD Medical Director [facility] St[facility] Pager: [phone] 05/09/2019 8:46 AM To contact Stroke Continuity provider, please refer to [email or web address] After hours, contact General Neurology

## 2019-05-10 ENCOUNTER — Encounter (HOSPITAL_COMMUNITY)
Admission: EM | Disposition: A | Discharge status: Skilled Nursing Facility | Payer: Self-pay | Source: Home / Self Care | Attending: Neurology

## 2019-05-10 ENCOUNTER — Encounter (HOSPITAL_COMMUNITY): Payer: Self-pay | Admitting: *Deleted

## 2019-05-10 LAB — CBC
HCT: 45.3 % (ref 39.0–52.0)
Hemoglobin: 14.8 g/dL (ref 13.0–17.0)
MCH: 27.5 pg (ref 26.0–34.0)
MCHC: 32.7 g/dL (ref 30.0–36.0)
MCV: 84.2 fL (ref 80.0–100.0)
Platelets: 262 10*3/uL (ref 150–400)
RBC: 5.38 MIL/uL (ref 4.22–5.81)
RDW: 15.2 % (ref 11.5–15.5)
WBC: 9.7 10*3/uL (ref 4.0–10.5)
nRBC: 0 % (ref 0.0–0.2)

## 2019-05-10 LAB — GLUCOSE, CAPILLARY
Glucose-Capillary: 100 mg/dL — ABNORMAL HIGH (ref 70–99)
Glucose-Capillary: 101 mg/dL — ABNORMAL HIGH (ref 70–99)
Glucose-Capillary: 104 mg/dL — ABNORMAL HIGH (ref 70–99)
Glucose-Capillary: 132 mg/dL — ABNORMAL HIGH (ref 70–99)
Glucose-Capillary: 84 mg/dL (ref 70–99)
Glucose-Capillary: 86 mg/dL (ref 70–99)
Glucose-Capillary: 94 mg/dL (ref 70–99)

## 2019-05-10 LAB — URINE CULTURE

## 2019-05-10 SURGERY — INSERTION, GASTROSTOMY TUBE, PERCUTANEOUS
Anesthesia: General

## 2019-05-10 NOTE — Anesthesia Preprocedure Evaluation (Deleted)
Anesthesia Evaluation    Reviewed: Allergy & Precautions, Patient's Chart, lab work & pertinent test results  Airway Mallampati: III  TM Distance: >3 FB Neck ROM: Full    Dental  (+) Edentulous Upper, Edentulous Lower   Pulmonary sleep apnea , COPD,  COPD inhaler, Current Smoker,           Cardiovascular hypertension, Pt. on medications and Pt. on home beta blockers + Peripheral Vascular Disease  + dysrhythmias Atrial Fibrillation  Rhythm:Regular  ECG: rate 69. Atrial fibrillation Left posterior fascicular block IMPRESSIONS    1. Left ventricular ejection fraction, by visual estimation, is 60 to 65%. The left ventricle has normal function. There is moderately increased left ventricular hypertrophy.  2. Left ventricular diastolic function could not be evaluated.  3. Global right ventricle has normal systolic function.The right ventricular size is normal. No increase in right ventricular wall thickness.  4. Left atrial size was severely dilated.  5. Right atrial size was mildly dilated.  6. Mild to moderate mitral annular calcification.  7. The mitral valve is grossly normal. No evidence of mitral valve regurgitation.  8. The tricuspid valve is grossly normal. Tricuspid valve regurgitation is trivial.  9. The aortic valve is tricuspid. Aortic valve regurgitation is mild. Mild aortic valve sclerosis without stenosis. 10. The pulmonic valve was not well visualized. Pulmonic valve regurgitation is not visualized.    Neuro/Psych PSYCHIATRIC DISORDERS CVA    GI/Hepatic Neg liver ROS, GERD  Medicated,  Endo/Other  Morbid obesity  Renal/GU negative Renal ROS     Musculoskeletal  (+) Arthritis , Back pain, chronic   Abdominal (+) + obese,   Peds  Hematology HLD   Anesthesia Other Findings   Reproductive/Obstetrics                             Anesthesia Physical  Anesthesia Plan  ASA: III and  emergent  Anesthesia Plan: General   Post-op Pain Management:    Induction: Intravenous and Rapid sequence  PONV Risk Score and Plan: 1 and 2 and Ondansetron, Dexamethasone and Treatment may vary due to age or medical condition  Airway Management Planned: Oral ETT  Additional Equipment:   Intra-op Plan:   Post-operative Plan: Extubation in OR  Informed Consent:   Plan Discussed with:   Anesthesia Plan Comments:         Anesthesia Quick Evaluation

## 2019-05-10 NOTE — Progress Notes (Signed)
Physical Therapy Treatment Patient Details Name: [name] MRN: [id number] DOB: 1955/08/26 Today's Date: 05/10/2019    History of Present Illness Kyle [name] is an 63 y.o. male  With PMH a. Fib ( not anticoagulated) sick sinus syndrom, HTN, CVA, OSA ( not on CPAP) who presented to [facility] as a code stroke. Pt found to have R MCA acute ischemic stroke due to right M1 occlusion, pt s/p mechanical thrombectomy.    PT Comments    Pt required increased assistance to achieve standing secondary to sitting in hole in inflated matress.  He was unable to scoot forward as his bed was soiled and caused him for friction when scooting.  He continues to fatigue quickly and will benefit from rehab in a post acute setting.     CIR(based on denial will require snf placement)     Equipment Recommendations  None recommended by PT;Other (comment)(TBD)    Recommendations for Other Services Rehab consult     Precautions / Restrictions Precautions Precautions: Fall Precaution Comments: strong L lateral lean Restrictions Weight Bearing Restrictions: No    Mobility  Bed Mobility Overal bed mobility: Needs Assistance Bed Mobility: Supine to Sit     Supine to sit: Mod assist;+2 for physical assistance(increased assistance to elevate trunk into a seated position.)     General bed mobility comments: Managament needed for lines and leads.  Assistance needed to move to edge of bed and to elevate trunk into a seated position.  Transfers Overall transfer level: Needs assistance Equipment used: Rolling walker (2 wheeled)(wide RW) Transfers: Sit to/from Stand Sit to Stand: Min guard Stand pivot transfers: +2 physical assistance;+2 safety/equipment;Min assist       General transfer comment: Increased assistance to come to standing.  Cues for hand placement and utilized rocking momentum.  Increased assistance as he appeared to be stuck in the bed.  Ambulation/Gait Ambulation/Gait assistance: Min  assist;+2 safety/equipment Gait Distance (Feet): 8 Feet Assistive device: Rolling walker (2 wheeled) Gait Pattern/deviations: Step-through pattern;Decreased stride length;Shuffle;Trunk flexed     General Gait Details: From bed to sink but more painful and fatigues quickly stood at sink to perform ADLs but did not stand longer than two- three mins.  Continues with ER of B hips and wide BOS.   Stairs             Wheelchair Mobility    Modified Rankin (Stroke Patients Only) Modified Rankin (Stroke Patients Only) Pre-Morbid Rankin Score: No significant disability Modified Rankin: Moderately severe disability     Balance Overall balance assessment: Needs assistance Sitting-balance support: Feet supported;Single extremity supported Sitting balance-Leahy Scale: Good Sitting balance - Comments: Able to functionally reach with supervision     Standing balance-Leahy Scale: Fair                              Cognition Arousal/Alertness: Awake/alert Behavior During Therapy: WFL for tasks assessed/performed Overall Cognitive Status: Difficult to assess                                 General Comments: Pt continues to write on paper to communicate which makes assesing cognition difficult.  He responds with head nods, pointing and gestures.      Exercises      General Comments        Pertinent Vitals/Pain Pain Assessment: Faces Faces Pain Scale: Hurts even more  Pain Location: L knee with weight bearing and movement, although B knees appeared to bother him today. Pain Descriptors / Indicators: Sore Pain Intervention(s): Monitored during session;Repositioned    Home Living                      Prior Function            PT Goals (current goals can now be found in the care plan section) Acute Rehab PT Goals PT Goal Formulation: With patient Potential to Achieve Goals: Good Progress towards PT goals: Progressing toward goals     Frequency    Min 3X/week      PT Plan Current plan remains appropriate    Co-evaluation PT/OT/SLP Co-Evaluation/Treatment: Yes Reason for Co-Treatment: Complexity of the patient's impairments (multi-system involvement) PT goals addressed during session: Mobility/safety with mobility OT goals addressed during session: ADL's and self-care      AM-PAC PT "6 Clicks" Mobility   Outcome Measure  Help needed turning from your back to your side while in a flat bed without using bedrails?: A Lot Help needed moving from lying on your back to sitting on the side of a flat bed without using bedrails?: A Lot Help needed moving to and from a bed to a chair (including a wheelchair)?: A Lot Help needed standing up from a chair using your arms (e.g., wheelchair or bedside chair)?: A Little Help needed to walk in hospital room?: A Little Help needed climbing 3-5 steps with a railing? : A Little 6 Click Score: 15    End of Session Equipment Utilized During Treatment: Gait belt Activity Tolerance: Patient tolerated treatment well Patient left: in chair;with call bell/phone within reach;with chair alarm set Nurse Communication: Mobility status PT Visit Diagnosis: Unsteadiness on feet (R26.81);Muscle weakness (generalized) (M62.81);Difficulty in walking, not elsewhere classified (R26.2);History of falling (Z91.81)     Time: 1357-1440 PT Time Calculation (min) (ACUTE ONLY): 43 min  Charges:  $Therapeutic Activity: 8-22 mins                     Erasmo Leventhal , PTA Acute Rehabilitation Services Pager [phone] Office [phone]     Serena Petterson Eli Hose 05/10/2019, 3:08 PM

## 2019-05-10 NOTE — Progress Notes (Signed)
Patient has concerns about undergoing surgery for PEG placement because of a family emergency with his sister. PA Claiborne Billings Rayburn at bedside. Dr. Leonie Man notified of patient's concerns. This nurse called Legrand Como, patient's cousin, and left multiple messages. When Legrand Como returned the phone call, he stated he was not able to come to hospital because he has leukemia. This nurse told Legrand Como about patient's concerns and stated he would get in touch with his sister, Collie Siad.  Patient started complaining that coretrak tube was irritating his stomach. Concerns for tube being displaced. Dr. Leonie Man notified. No new orders. Nurse will continue to monitor. [place]

## 2019-05-10 NOTE — Progress Notes (Signed)
STROKE TEAM PROGRESS NOTE   INTERVAL HISTORY (subjective) Patient is not willing to undergo laparoscopic surgery today as he feels there has been a family emergency and he is awaiting decision on his sister whether she is going to hospice or not.  I personally spoke to the patient explained to him the need for the laparoscopy and PEG tube in order to give him his nourishment and medication so that he can recover sooner and eventually be with his family.  He initially relented and told me he would undergo the surgery.  However later on when the surgical PA came and spoke to the patient he again refused surgery and said will not be done today and perhaps not until next week as it is elective and not an emergency.  Vitals:   05/09/19 1922 05/10/19 0005 05/10/19 0432 05/10/19 0813  BP: (!) 145/79 (!) 147/86 (!) 157/84 (!) 143/86  Pulse: 75 65 86 87  Resp: 20  16 18   Temp: 97.8 F (36.6 C) 98 F (36.7 C) (!) 97.5 F (36.4 C) 98.3 F (36.8 C)  TempSrc: Oral Oral Oral Oral  SpO2: 99% 99% 95% 97%  Weight:   (!) 151 kg   Height:        CBC:  Recent Labs  Lab 05/09/19 0329 05/10/19 0245  WBC 8.7 9.7  HGB 15.0 14.8  HCT 45.3 45.3  MCV 83.4 84.2  PLT 273 [id number]   Basic Metabolic Panel:  Recent Labs  Lab 05/03/19 1646  05/05/19 2316 05/06/19 0321  NA  --    < > 135 134*  K  --    < > 3.8 3.7  CL  --    < > 100 98  CO2  --    < > 24 25  GLUCOSE  --    < > 110* 103*  BUN  --    < > 22 21  CREATININE  --    < > 0.70 0.69  CALCIUM  --    < > 8.8* 8.8*  MG 1.9  --  1.8  --   PHOS 2.9  --   --   --    < > = values in this interval not displayed.   Lipid Panel:     Component Value Date/Time   CHOL 102 05/01/2019 0630   CHOL 142 12/19/2014 0804   TRIG 98 05/01/2019 0630   HDL 25 (L) 05/01/2019 0630   HDL 29 (L) 12/19/2014 0804   CHOLHDL 4.1 05/01/2019 0630   VLDL 20 05/01/2019 0630   LDLCALC 57 05/01/2019 0630   LDLCALC 93 12/19/2014 0804   HgbA1c:  Lab Results  Component  Value Date   HGBA1C 5.5 05/01/2019   Urine Drug Screen:     Component Value Date/Time   LABOPIA NONE DETECTED 04/30/2019 1507   COCAINSCRNUR NONE DETECTED 04/30/2019 1507   COCAINSCRNUR Negative 03/15/2015 1141   LABBENZ NONE DETECTED 04/30/2019 1507   AMPHETMU POSITIVE (A) 04/30/2019 1507   THCU NONE DETECTED 04/30/2019 1507   LABBARB NONE DETECTED 04/30/2019 1507    Alcohol Level     Component Value Date/Time   ETH <10 04/30/2019 1152    IMAGING past 24h Dg Abd 1 View  Result Date: 05/08/2019 CLINICAL DATA:  Status post feeding tube placement. EXAM: ABDOMEN - 1 VIEW COMPARISON:  None. FLUOROSCOPY TIME:  1 minute, 30 seconds. FINDINGS: A feeding tube was placed under fluoroscopy by radiology technologist Elvera Lennox with its tip left in the  distal stomach. IMPRESSION: As above. Electronically Signed   By: Inge Rise M.D.   On: 05/08/2019 15:30   Dg Abd Portable 1v  Result Date: 05/08/2019 CLINICAL DATA:  Nasogastric tube placement EXAM: PORTABLE ABDOMEN - 1 VIEW COMPARISON:  May 08, 2019 study obtained earlier in the day FINDINGS: Nasogastric tube tip and side port are present in the proximal body of the stomach. There are surgical clips at the gastroesophageal junction. There is no bowel dilatation or air-fluid level to suggest bowel obstruction. No free air. IMPRESSION: Nasogastric tube tip and side port in proximal body of stomach. Surgical clips at gastroesophageal junction. No bowel obstruction or free air demonstrable. Electronically Signed   By: Lowella Grip III M.D.   On: 05/08/2019 11:46    PHYSICAL EXAM      General - morbidly obese, well developed, not in acute distress.  Ophthalmologic - fundi not visualized due to noncooperation.  Cardiovascular - irregularly irregular heart rate and rhythm.  Neuro - awake alert, still anarthric, not able to answer orientation questions but able to write them out on a note bad, he is fully oriented.. Follows all  simple commands PERRL, right gaze full but incomplete left gaze, blinking to visual threat bilaterally. Bilateral facial weakness but left seems more than right. Tongue protrusion difficulty. RUE 5/5, LUE 4+/5 proximal and 4/5 finger grip. RLE 5/5. LLE 4+/5 proximal and 5-/5 distal. DTR 1+ and no babinski. Sensation symmetrical. Bilateral FTN intact. Gait not tested.    ASSESSMENT/PLAN [name] is a 63 y.o. male with history of a. Fib ( not anticoagulated) sick sinus syndrom, HTN, CVA, OSA ( not on CPAP) presenting with right side gaze and left arm paralysis.   Stroke:   R MCA infarct due to right MCA occlusion s/p IR w/ TICI2c revascularization -  Embolic econdary to known AF not on [facility]  Code Stroke CT head No acute abnormality. Probably hyperdense R MCA bifurcation. Old R temporal lobe and L frontal lobe infarcts. .     CTA head & neck suboptimal. Occlusion R MCA at bifurcation.  CT perfusion large delayed perfusion R MCA. No core infarct.   Cerebral angio occluded R M1, TICI2c   MRI  R MCA infarct (operculum). B MCA encephalomalacia  MRA  Improved R MCA patency  2D Echo EF 60-65%. No source of embolus   LDL 57   HgbA1c 5.5   HIV neg  Lovenox 40 mg sq daily for VTE prophylaxis  aspirin 81 mg daily prior to admission, off IV heparin d/t bleeding from penis. Hold antiplatelets as well pending OR.  Therapy recommendations:  CIR->family prefer SNF for longer term care.  Disposition:  Pending.  Bed obtained. Need to address swallow proir to d/c   Atrial Fibrillation / SSS  Home anticoagulation:  not taking   Started on Xarelto 09/15/2018, documented not taking 04/23/2019 (family practice note) . Now off IV heparin d/t bleeding from penis. Hold antiplatelets as well pending OR.  Bradycardia with pauses (2 seconds) - Dr Rory Percy saw 11/20 - pt asymptomatic - currently rate in 70's - will decrease metoprolol dose.   Hypertension  Home meds:  Lisinopril 20 . Treated w/  cleviprex, now off. Labetalol prn  . BP goal < 180 . BP stable now    . On lisinopril 10 to bid and metoprolol 12.5 bid . Long-term BP goal normotensive  Hyperlipidemia  Home meds:  Prescribed lipitor - ? taking  LDL 57, goal < 70  Now on lipitor 40  Continue statin at discharge  Dysphagia . Secondary to stroke . NPO . Speech on board . cortrak out and replaced w/ panda under fluoro.  . Need to address swallow plan prior to SNF placement . TF on hold for reported OR yesterday at 1300; now planned for today but pt wants to go home. Dr. Leonie Man discussed with him further and he is agreeable to stay. He cannot be discharged until a decision is made r/t his feeding plan (tube vs no tube) . Trauma / IR cannot place PEG d/t gastroplasty in 1992. Bariatric GS saw . For exp lap vs open G vs open J tube today.  Tobacco abuse  Current smoker  Smoking cessation counseling provided  Pt is willing to quit   Other Stroke Risk Factors  ETOH use, alcohol level <10, advised to drink no more than 2 drink(s) a day  Substance abuse UDS:  Amphetamines  POSITIVE  Morbid Obesity, Body mass index is 41.61 kg/m., hx failed open gastroplasty in 1993, recommend weight loss, diet and exercise as appropriate   Hx stroke/TIA by imaging - Old R temporal lobe and L frontal lobe infarcts - likely due to AFib  Obstructive sleep apnea, not on CPAP at home  PVD  Other Active Problems  Leukocytosis, resolved.   COPD  Hypokalemia, resolved  Chronic pain syndrome, chronic B LBP w/ R sided sciatica, Pain from recent fallo narcotics at home. Follow[facility] at [facility]  Adjustment d/o w/ mixed anxiety and depressed mood  Bleeding from penis. No hx foley. IV heparin off. UA w/ > 50 RBC, 8-10 WBC, few bacteria. UCx w/ multiple species, suggest recollection.  Hospital day # 10  Patient continues to be an arthritic unable to speak with severe dysphagia  requiring tube feeds.  He was scheduled to undergo laparoscopy today to decide on surgical placement of feeding tube however the patient is refusing and hence will be removed from the schedule and surgery may not happen next several days due to the holiday schedule.  I explained to this to the patient but is unwilling to undergo surgery today.  Continue tube feeds and current ongoing stroke management.  Resume IV heparin and watch for urethral bleeding.  Greater than 50% time during this 25-minute visit was spent on counseling and coordination of care and discussion with care team.  Antony Contras, MD Medical Director [place] Pager: [phone] 05/10/2019 11:20 AM To contact Stroke Continuity provider, please refer to [email or web address] After hours, contact General Neurology

## 2019-05-10 NOTE — Progress Notes (Signed)
[facility] Progress Note  10 Days Post-Op  Subjective: CC: family emergency Patient reports to me that he has had a family emergency and something happened with his sister. He reports that a decision is being made about hospice care either later today vs tomorrow. He wants to be able to see his family to be there to support them. He did not initially realize we are in [place], but seems to understand/comprehend otherwise. He wants to be able to leave to see his family but come back because he knows he needs further care.  RN present in room for this conversation and notifying primary service and care management.   Objective: Vital signs in last 24 hours: Temp:  [97.5 F (36.4 C)-98.1 F (36.7 C)] 97.5 F (36.4 C) (11/25 0432) Pulse Rate:  [65-99] 86 (11/25 0432) Resp:  [16-20] 16 (11/25 0432) BP: (138-181)/(73-91) 157/84 (11/25 0432) SpO2:  [95 %-99 %] 95 % (11/25 0432) Weight:  [151 kg] 151 kg (11/25 0432) Last BM Date: 05/09/19  Intake/Output from previous day: 11/24 0701 - 11/25 0700 In: 1461.4 [I.V.:1461.4] Out: 175 [Urine:175] Intake/Output this shift: No intake/output data recorded.  PE: Gen:  Alert, NAD, nonverbal  Pulm:  Rate and effort normal Abd: obese, not distended Skin: warm and dry  Lab Results:  Recent Labs    05/09/19 0329 05/10/19 0245  WBC 8.7 9.7  HGB 15.0 14.8  HCT 45.3 45.3  PLT 273 262   BMET No results for input(s): NA, K, CL, CO2, GLUCOSE, BUN, CREATININE, CALCIUM in the last 72 hours. PT/INR No results for input(s): LABPROT, INR in the last 72 hours. CMP     Component Value Date/Time   NA 134 (L) 05/06/2019 0321   NA 139 12/19/2014 0804   K 3.7 05/06/2019 0321   CL 98 05/06/2019 0321   CO2 25 05/06/2019 0321   GLUCOSE 103 (H) 05/06/2019 0321   BUN 21 05/06/2019 0321   BUN 16 12/19/2014 0804   CREATININE 0.69 05/06/2019 0321   CREATININE 0.75 06/20/2011 1032   CALCIUM 8.8 (L) 05/06/2019 0321   PROT 7.2 04/30/2019  1152   PROT 6.9 12/19/2014 0804   ALBUMIN 3.5 04/30/2019 1152   ALBUMIN 4.2 12/19/2014 0804   AST 24 04/30/2019 1152   ALT 16 04/30/2019 1152   ALKPHOS 77 04/30/2019 1152   BILITOT 1.0 04/30/2019 1152   BILITOT 0.8 12/19/2014 0804   GFRNONAA >60 05/06/2019 0321   GFRAA >60 05/06/2019 0321   Lipase     Component Value Date/Time   LIPASE 28 04/23/2019 1734       Studies/Results: Dg Abd 1 View  Result Date: 05/08/2019 CLINICAL DATA:  Status post feeding tube placement. EXAM: ABDOMEN - 1 VIEW COMPARISON:  None. FLUOROSCOPY TIME:  1 minute, 30 seconds. FINDINGS: A feeding tube was placed under fluoroscopy by radiology technologist Elvera Lennox with its tip left in the distal stomach. IMPRESSION: As above. Electronically Signed   By: Inge Rise M.D.   On: 05/08/2019 15:30   Dg Abd Portable 1v  Result Date: 05/08/2019 CLINICAL DATA:  Nasogastric tube placement EXAM: PORTABLE ABDOMEN - 1 VIEW COMPARISON:  May 08, 2019 study obtained earlier in the day FINDINGS: Nasogastric tube tip and side port are present in the proximal body of the stomach. There are surgical clips at the gastroesophageal junction. There is no bowel dilatation or air-fluid level to suggest bowel obstruction. No free air. IMPRESSION: Nasogastric tube tip and side port in proximal body of  stomach. Surgical clips at gastroesophageal junction. No bowel obstruction or free air demonstrable. Electronically Signed   By: Lowella Grip III M.D.   On: 05/08/2019 11:46   Dg Abd Portable 1v  Result Date: 05/08/2019 CLINICAL DATA:  63 year old male status post NG placement. EXAM: PORTABLE ABDOMEN - 1 VIEW COMPARISON:  None. FINDINGS: An enteric tube is partially visualized with tip and side-port in the region of the GE junction. The tube appears to be bent on itself distal to the side port. Evaluation and repositioning. Dilated small bowel noted in the lower abdomen measuring up to 4.7 cm. The osseous structures and  soft tissues are unremarkable. IMPRESSION: 1. Enteric tube bent on itself with tip and side-port in the region of the GE junction. Evaluation and repositioning is recommended. 2. Dilated small bowel in the lower abdomen measuring up to 4.7 cm. Electronically Signed   By: Anner Crete M.D.   On: 05/08/2019 11:06    Anti-infectives: Anti-infectives (From admission, onward)   Start     Dose/Rate Route Frequency Ordered Stop   05/09/19 0600  cefoTEtan (CEFOTAN) 2 g in sodium chloride 0.9 % 100 mL IVPB     2 g 200 mL/hr over 30 Minutes Intravenous On call to O.R. 05/08/19 1533 05/10/19 0559   04/30/19 1227  ceFAZolin (ANCEF) 2-4 GM/100ML-% IVPB    Note to Pharmacy: Margaretmary Dys   : cabinet override      04/30/19 1227 05/01/19 0029       Assessment/Plan Atrial fibrillation HTN H/o CVA Tobacco abuse OSA  Right MCAinfarcts/pTICI 2c revascularizationby IR11/15/20 Dysphagia H/o failed gastroplasty in 1992, modified sleeve gastrectomy/ 3 hours of lysis of adhesionsby Dr. Hassell Done in 2013 - Patient with complicated abdominal surgical history and the need for gastrostomy tube placement for tube feedings in the setting of acute stroke.Would plan diagnostic laparoscopy, lap vs open G vs J tube depending on anatomy. He is high riskof complications and periop mortality. - patient is not ready for surgery today - having family issues and is not sure what to do - will follow up next week to figure out surgical planning    ID -none currently VTE -SCDs, ok to resume heparin from surgery standpoint FEN -ok to resume TF from surgery standpoint Foley -none Follow up -TBD  LOS: 10 days    Brigid Re , [facility] 05/10/2019, 8:03 AM Please see Amion for pager number during day hours 7:00am-4:30pm

## 2019-05-10 NOTE — Progress Notes (Signed)
Chaplain visited the patient in response to a spiritual care consult.  The patient wanted prayer prior to an operation as well as prayer for their sister who is being moved to hospice.  The chaplain prayed for the patient but will follow-up if requested.  Brion Aliment Chaplain Resident For questions concerning this note please contact me by pager [phone]

## 2019-05-10 NOTE — Progress Notes (Signed)
Occupational Therapy Treatment Patient Details Name: Kyle [name] MRN: [id number] DOB: September 12, 1955 Today's Date: 05/10/2019    History of present illness Kyle [name] is an 63 y.o. male  With PMH a. Fib ( not anticoagulated) sick sinus syndrom, HTN, CVA, OSA ( not on CPAP) who presented to [facility] as a code stroke. Pt found to have R MCA acute ischemic stroke due to right M1 occlusion, pt s/p mechanical thrombectomy.   OT comments  Pt received in bed, saturated. Required modA+2 to progress to EOB. Pt required minguard-minA for functional mobility during ADL. He demonstrated improvements with locating ADL items on the left side of environment. Pt required maxA for thorough posterior pericare and maxA for thorough hair care. Pt will continue to benefit from skilled OT services to maximize safety and independence with ADL/IADL and functional mobility. Will continue to follow acutely and progress as tolerated.    Follow Up Recommendations  CIR;Supervision/Assistance - 24 hour    Equipment Recommendations  Other (comment)(TBD)    Recommendations for Other Services      Precautions / Restrictions Precautions Precautions: Fall Precaution Comments: strong L lateral lean Restrictions Weight Bearing Restrictions: No       Mobility Bed Mobility Overal bed mobility: Needs Assistance Bed Mobility: Supine to Sit     Supine to sit: Mod assist;+2 for physical assistance(increased assistance to elevate trunk into a seated position.)     General bed mobility comments: Managament needed for lines and leads.  Assistance needed to move to edge of bed and to elevate trunk into a seated position.  Transfers Overall transfer level: Needs assistance Equipment used: Rolling walker (2 wheeled)(wide RW) Transfers: Sit to/from Stand Sit to Stand: Min assist;Min guard Stand pivot transfers: +2 physical assistance;+2 safety/equipment;Min assist       General transfer comment: Increased assistance  to come to standing.  Cues for hand placement and utilized rocking momentum.  Increased assistance as he appeared to be stuck in the bed.    Balance Overall balance assessment: Needs assistance Sitting-balance support: Feet supported;Single extremity supported Sitting balance-Leahy Scale: Good Sitting balance - Comments: Able to functionally reach with supervision   Standing balance support: Bilateral upper extremity supported;During functional activity Standing balance-Leahy Scale: Fair Standing balance comment: dependent on RW or physical assist                           ADL either performed or assessed with clinical judgement   ADL Overall ADL's : Needs assistance/impaired     Grooming: Maximal assistance Grooming Details (indicate cue type and reason): maxA for thorough hair care;pt limited by activity tolerance                 Toilet Transfer: Minimal assistance;RW;Ambulation Toilet Transfer Details (indicate cue type and reason): simulated from EOB to sink, sitting in recliner Toileting- Clothing Manipulation and Hygiene: Maximal assistance Toileting - Clothing Manipulation Details (indicate cue type and reason): pt able to reach for posterior pericare, required assistance for thorough cleaning;     Functional mobility during ADLs: Minimal assistance;Rolling walker General ADL Comments: pt saturated in bed upon arrival, assisted pt with thorough cleaning at sink level;     Vision   Vision Assessment?: Vision impaired- to be further tested in functional context   Perception     Praxis      Cognition Arousal/Alertness: Awake/alert Behavior During Therapy: WFL for tasks assessed/performed Overall Cognitive Status: Difficult to assess  General Comments: Pt continues to write on paper to communicate which makes assesing cognition difficult.  He responds with head nods, pointing and gestures.         Exercises     Shoulder Instructions       General Comments vss    Pertinent Vitals/ Pain       Pain Assessment: Faces Faces Pain Scale: Hurts even more Pain Location: L knee with weight bearing and movement, although B knees appeared to bother him today. Pain Descriptors / Indicators: Sore Pain Intervention(s): Monitored during session  Home Living                                          Prior Functioning/Environment              Frequency  Min 2X/week        Progress Toward Goals  OT Goals(current goals can now be found in the care plan section)  Progress towards OT goals: Progressing toward goals  Acute Rehab OT Goals OT Goal Formulation: With patient Time For Goal Achievement: 05/15/19 Potential to Achieve Goals: Good ADL Goals Pt Will Perform Grooming: with mod assist;standing Pt Will Perform Lower Body Dressing: with min assist;sitting/lateral leans;sit to/from stand Pt Will Transfer to Toilet: with mod assist;stand pivot transfer;bedside commode Additional ADL Goal #1: Pt will identify 3 BADL items in L side of environment Additional ADL Goal #2: Pt will follow multistep command for safe and successful completion of BADL  Plan Discharge plan remains appropriate;Frequency remains appropriate    Co-evaluation    PT/OT/SLP Co-Evaluation/Treatment: Yes Reason for Co-Treatment: Complexity of the patient's impairments (multi-system involvement);For patient/therapist safety;To address functional/ADL transfers PT goals addressed during session: Mobility/safety with mobility OT goals addressed during session: ADL's and self-care      AM-PAC OT "6 Clicks" Daily Activity     Outcome Measure   Help from another person eating meals?: Total Help from another person taking care of personal grooming?: A Little Help from another person toileting, which includes using toliet, bedpan, or urinal?: A Little Help from another person bathing  (including washing, rinsing, drying)?: A Lot Help from another person to put on and taking off regular upper body clothing?: A Little Help from another person to put on and taking off regular lower body clothing?: Total 6 Click Score: 13    End of Session Equipment Utilized During Treatment: Rolling walker;Oxygen  OT Visit Diagnosis: Unsteadiness on feet (R26.81);Other abnormalities of gait and mobility (R26.89);Hemiplegia and hemiparesis;Cognitive communication deficit (R41.841);Other symptoms and signs involving cognitive function Symptoms and signs involving cognitive functions: Cerebral infarction Hemiplegia - Right/Left: Left Hemiplegia - dominant/non-dominant: Non-Dominant Hemiplegia - caused by: Cerebral infarction   Activity Tolerance Patient tolerated treatment well   Patient Left with call bell/phone within reach;in chair;with chair alarm set   Nurse Communication Mobility status        Time: [id number] OT Time Calculation (min): 41 min  Charges: OT General Charges $OT Visit: 1 Visit OT Treatments $Self Care/Home Management : 23-37 mins  [place] Office: [place] 05/10/2019, 4:30 PM

## 2019-05-11 LAB — GLUCOSE, CAPILLARY
Glucose-Capillary: 150 mg/dL — ABNORMAL HIGH (ref 70–99)
Glucose-Capillary: 118 mg/dL — ABNORMAL HIGH (ref 70–99)
Glucose-Capillary: 120 mg/dL — ABNORMAL HIGH (ref 70–99)
Glucose-Capillary: 112 mg/dL — ABNORMAL HIGH (ref 70–99)
Glucose-Capillary: 100 mg/dL — ABNORMAL HIGH (ref 70–99)

## 2019-05-11 LAB — CBC
HCT: 43.9 % (ref 39.0–52.0)
Hemoglobin: 14.2 g/dL (ref 13.0–17.0)
MCH: 27.5 pg (ref 26.0–34.0)
MCHC: 32.3 g/dL (ref 30.0–36.0)
MCV: 85.1 fL (ref 80.0–100.0)
Platelets: 245 10*3/uL (ref 150–400)
RBC: 5.16 MIL/uL (ref 4.22–5.81)
RDW: 15.3 % (ref 11.5–15.5)
WBC: 14.3 10*3/uL — ABNORMAL HIGH (ref 4.0–10.5)
nRBC: 0 % (ref 0.0–0.2)

## 2019-05-11 LAB — HEPARIN LEVEL (UNFRACTIONATED): Heparin Unfractionated: 0.17 IU/mL — ABNORMAL LOW (ref 0.30–0.70)

## 2019-05-11 MED ORDER — HEPARIN (PORCINE) 25000 UT/250ML-% IV SOLN
2350.0000 [IU]/h | INTRAVENOUS | Status: DC
  Administered 2019-05-11: 14:00:00 1800 [IU]/h via INTRAVENOUS
  Administered 2019-05-12: 1950 [IU]/h via INTRAVENOUS
  Administered 2019-05-12 – 2019-05-13 (×2): 2250 [IU]/h via INTRAVENOUS
  Administered 2019-05-13 – 2019-05-14 (×3): 2350 [IU]/h via INTRAVENOUS
  Administered 2019-05-15: 2000 [IU]/h via INTRAVENOUS
  Filled 2019-05-11 (×8): qty 250

## 2019-05-11 NOTE — Progress Notes (Signed)
ANTICOAGULATION CONSULT NOTE - Follow up Kyle [name] for heparin Indication: atrial fibrillation and stroke  Allergies  Allergen Reactions  . Vioxx [Rofecoxib] Rash    Patient Measurements: Height: 6\' 3"  (190.5 cm) Weight: (!) 332 lb 14.3 oz (151 kg) IBW/kg (Calculated) : 84.5 Heparin Dosing Weight: 121 kg  Vital Signs: Temp: 97.5 F (36.4 C) (11/26 2321) Temp Source: Oral (11/26 2321) BP: 102/65 (11/26 2321) Pulse Rate: 73 (11/26 2321)  Labs: Recent Labs    05/09/19 0329 05/09/19 0808 05/10/19 0245 05/11/19 0334 05/11/19 2201  HGB 15.0  --  14.8 14.2  --   HCT 45.3  --  45.3 43.9  --   PLT 273  --  262 245  --   HEPARINUNFRC  --  <0.10*  --   --  0.17*    Estimated Creatinine Clearance: 152.4 mL/min (by C-G formula based on SCr of 0.69 mg/dL).  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 Hx of Afib with stroke likely d/t not being on anticoagulation.  Heparin level 0.17 units/ml  Goal of Therapy:  Heparin level 0.3 - 0.5 units/ml Monitor platelets by anticoagulation protocol: Yes   Plan:  Increase heparin to 1950 units/hr Daily hep lvl cbc Thanks for allowing pharmacy to be a part of this patient's care.  Excell Seltzer, PharmD Clinical Pharmacist  05/11/2019 11:36 PM

## 2019-05-11 NOTE — Progress Notes (Signed)
ANTICOAGULATION CONSULT NOTE - Follow up [place] for heparin Indication: atrial fibrillation and stroke  Allergies  Allergen Reactions  . Vioxx [Rofecoxib] Rash    Patient Measurements: Height: 6\' 3"  (190.5 cm) Weight: (!) 332 lb 14.3 oz (151 kg) IBW/kg (Calculated) : 84.5 Heparin Dosing Weight: 121 kg  Vital Signs: Temp: 98.4 F (36.9 C) (11/26 1210) Temp Source: Oral (11/26 1210) BP: 112/66 (11/26 1210) Pulse Rate: 66 (11/26 1210)  Labs: Recent Labs    05/09/19 0329 05/09/19 0808 05/10/19 0245 05/11/19 0334  HGB 15.0  --  14.8 14.2  HCT 45.3  --  45.3 43.9  PLT 273  --  262 245  HEPARINUNFRC  --  <0.10*  --   --     Estimated Creatinine Clearance: 152.4 mL/min (by C-G formula based on SCr of 0.69 mg/dL).  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 Hx of Afib with stroke likely d/t not being on anticoagulation.  Hep had been on hold d/t penile bleeding now resolved and stroke team wants to resume Planned PEG sometime next week  CBC stable  Goal of Therapy:  Heparin level 0.3 - 0.5 units/ml Monitor platelets by anticoagulation protocol: Yes   Plan:  Resume heparin 1800 units/hr 2100 hep lvl Daily hep lvl cbc  Barth Kirks, PharmD, BCPS, BCCCP Clinical Pharmacist [phone]  Please check AMION for all [place] numbers  05/11/2019 12:19 PM

## 2019-05-11 NOTE — Progress Notes (Signed)
STROKE TEAM PROGRESS NOTE   INTERVAL HISTORY (subjective) [name] willing to undergo laparoscopic surgery y`day as he felt there was a family emergency and he is awaiting decision on his sister whether she is going to hospice or not.  I personally spoke to the patient explained to him the need for the laparoscopy and PEG tube in order to give him his nourishment and medication so that he can recover sooner and eventually be with his family.  He initially relented and told me he would undergo the surgery.  However later on when the surgical PA came and spoke to the patient he again refused surgery and surgical team now feels it is elective and not an emergency. and will not be done till next week.  Patient has no new complaints today.  His vital signs are stable.  Vitals:   05/10/19 1928 05/10/19 2341 05/11/19 0406 05/11/19 0836  BP: (!) 141/76 137/77 (!) 137/58 136/61  Pulse: 90 77 86 79  Resp: 19 20 18 18   Temp: 99.5 F (37.5 C) 97.8 F (36.6 C) 98.4 F (36.9 C) 98.5 F (36.9 C)  TempSrc: Oral Oral Oral Oral  SpO2: 94% 96% 92% 96%  Weight:      Height:        CBC:  Recent Labs  Lab 05/10/19 0245 05/11/19 0334  WBC 9.7 14.3*  HGB 14.8 14.2  HCT 45.3 43.9  MCV 84.2 85.1  PLT 262 99991111   Basic Metabolic Panel:  Recent Labs  Lab 05/05/19 2316 05/06/19 0321  NA 135 134*  K 3.8 3.7  CL 100 98  CO2 24 25  GLUCOSE 110* 103*  BUN 22 21  CREATININE 0.70 0.69  CALCIUM 8.8* 8.8*  MG 1.8  --    Lipid Panel:     Component Value Date/Time   CHOL 102 05/01/2019 0630   CHOL 142 12/19/2014 0804   TRIG 98 05/01/2019 0630   HDL 25 (L) 05/01/2019 0630   HDL 29 (L) 12/19/2014 0804   CHOLHDL 4.1 05/01/2019 0630   VLDL 20 05/01/2019 0630   LDLCALC 57 05/01/2019 0630   LDLCALC 93 12/19/2014 0804   HgbA1c:  Lab Results  Component Value Date   HGBA1C 5.5 05/01/2019   Urine Drug Screen:     Component Value Date/Time   LABOPIA NONE DETECTED 04/30/2019 1507   COCAINSCRNUR  NONE DETECTED 04/30/2019 1507   COCAINSCRNUR Negative 03/15/2015 1141   LABBENZ NONE DETECTED 04/30/2019 1507   AMPHETMU POSITIVE (A) 04/30/2019 1507   THCU NONE DETECTED 04/30/2019 1507   LABBARB NONE DETECTED 04/30/2019 1507    Alcohol Level     Component Value Date/Time   ETH <10 04/30/2019 1152    IMAGING past 24h No results found.  PHYSICAL EXAM      General - morbidly obese, well developed, not in acute distress.  Ophthalmologic - fundi not visualized due to noncooperation.  Cardiovascular - irregularly irregular heart rate and rhythm.  Neuro - awake alert, still anarthric, not able to answer orientation questions but able to write them out on a note bad, he is fully oriented.. Follows all simple commands PERRL, right gaze full but incomplete left gaze, blinking to visual threat bilaterally. Bilateral facial weakness but left seems more than right. Tongue protrusion difficulty. RUE 5/5, LUE 4+/5 proximal and 4/5 finger grip. RLE 5/5. LLE 4+/5 proximal and 5-/5 distal. DTR 1+ and no babinski. Sensation symmetrical. Bilateral FTN intact. Gait not tested.    ASSESSMENT/PLAN Kyle [name] is a 63 y.o. male with history of a. Fib ( not anticoagulated) sick sinus syndrom, HTN, CVA, OSA ( not on CPAP) presenting with right side gaze and left arm paralysis.   Stroke:   R MCA infarct due to right MCA occlusion s/p IR w/ TICI2c revascularization -  Embolic econdary to known AF not on [facility]  Code Stroke CT head No acute abnormality. Probably hyperdense R MCA bifurcation. Old R temporal lobe and L frontal lobe infarcts. .     CTA head & neck suboptimal. Occlusion R MCA at bifurcation.  CT perfusion large delayed perfusion R MCA. No core infarct.   Cerebral angio occluded R M1, TICI2c   MRI  R MCA infarct (operculum). B MCA encephalomalacia  MRA  Improved R MCA patency  2D Echo EF 60-65%. No source of embolus   LDL 57   HgbA1c 5.5   HIV neg  Lovenox 40 mg sq daily for  VTE prophylaxis  aspirin 81 mg daily prior to admission, off IV heparin d/t bleeding from penis. Hold antiplatelets as well pending OR.  Therapy recommendations:  CIR->family prefer SNF for longer term care.  Disposition:  Pending.  Bed obtained. Need to address swallow proir to d/c   Atrial Fibrillation / SSS  Home anticoagulation:  not taking   Started on Xarelto 09/15/2018, documented not taking 04/23/2019 (family practice note) . Now off IV heparin d/t bleeding from penis. Hold antiplatelets as well pending OR.  Bradycardia with pauses (2 seconds) - Dr Rory Percy saw 11/20 - pt asymptomatic - currently rate in 70's - will decrease metoprolol dose.   Hypertension  Home meds:  Lisinopril 20 . Treated w/ cleviprex, now off. Labetalol prn  . BP goal < 180 . BP stable now    . On lisinopril 10 to bid and metoprolol 12.5 bid . Long-term BP goal normotensive  Hyperlipidemia  Home meds:  Prescribed lipitor - ? taking  LDL 57, goal < 70  Now on lipitor 40  Continue statin at discharge  Dysphagia . Secondary to stroke . NPO . Speech on board . cortrak out and replaced w/ panda under fluoro.  . Need to address swallow plan prior to SNF placement . TF on hold for reported OR yesterday at 1300; now planned for today but pt wants to go home. Dr. Leonie Man discussed with him further and he is agreeable to stay. He cannot be discharged until a decision is made r/t his feeding plan (tube vs no tube) . Trauma / IR cannot place PEG d/t gastroplasty in 1992. Bariatric GS saw . For exp lap vs open G vs open J tube today.  Tobacco abuse  Current smoker  Smoking cessation counseling provided  Pt is willing to quit   Other Stroke Risk Factors  ETOH use, alcohol level <10, advised to drink no more than 2 drink(s) a day  Substance abuse UDS:  Amphetamines  POSITIVE  Morbid Obesity, Body mass index is 41.61 kg/m., hx failed open gastroplasty in 1993, recommend weight loss, diet and exercise  as appropriate   Hx stroke/TIA by imaging - Old R temporal lobe and L frontal lobe infarcts - likely due to AFib  Obstructive sleep apnea, not on CPAP at home  PVD  Other Active Problems  Leukocytosis, resolved.   COPD  Hypokalemia, resolved  Chronic pain syndrome, chronic B LBP w/ R sided sciatica, Pain from recent fallo narcotics at home. Followed at [place]  Adjustment d/o w/ mixed anxiety and depressed mood  Bleeding from penis. No hx foley. IV heparin off. UA w/ > 50 RBC, 8-10 WBC, few bacteria. UCx w/ multiple species, suggest recollection.  Hospital day # 11  Patient continues to be anarthritic unable to speak with severe dysphagia requiring tube feeds.  He is surgical PEG tube placement will not happen until next week now.  Continue tube feeds and current ongoing stroke management.  Resume IV heparin and watch for urethral bleeding.  Greater than 50% time during this 25-minute visit was spent on counseling and coordination of care and discussion with care team.  Antony Contras, MD Medical Director [place] Pager: [phone] 05/11/2019 11:35 AM To contact Stroke Continuity provider, please refer to [email or web address] After hours, contact General Neurology

## 2019-05-12 ENCOUNTER — Encounter (HOSPITAL_COMMUNITY): Payer: Self-pay | Admitting: Interventional Radiology

## 2019-05-12 LAB — GLUCOSE, CAPILLARY
Glucose-Capillary: 101 mg/dL — ABNORMAL HIGH (ref 70–99)
Glucose-Capillary: 106 mg/dL — ABNORMAL HIGH (ref 70–99)
Glucose-Capillary: 114 mg/dL — ABNORMAL HIGH (ref 70–99)
Glucose-Capillary: 74 mg/dL (ref 70–99)
Glucose-Capillary: 75 mg/dL (ref 70–99)
Glucose-Capillary: 82 mg/dL (ref 70–99)
Glucose-Capillary: 95 mg/dL (ref 70–99)

## 2019-05-12 LAB — CBC
HCT: 40.4 % (ref 39.0–52.0)
Hemoglobin: 13.2 g/dL (ref 13.0–17.0)
MCH: 27.4 pg (ref 26.0–34.0)
MCHC: 32.7 g/dL (ref 30.0–36.0)
MCV: 84 fL (ref 80.0–100.0)
Platelets: 220 10*3/uL (ref 150–400)
RBC: 4.81 MIL/uL (ref 4.22–5.81)
RDW: 15.3 % (ref 11.5–15.5)
WBC: 10.7 10*3/uL — ABNORMAL HIGH (ref 4.0–10.5)
nRBC: 0 % (ref 0.0–0.2)

## 2019-05-12 LAB — HEPARIN LEVEL (UNFRACTIONATED)
Heparin Unfractionated: 0.19 IU/mL — ABNORMAL LOW (ref 0.30–0.70)
Heparin Unfractionated: 0.4 IU/mL (ref 0.30–0.70)

## 2019-05-12 NOTE — Progress Notes (Signed)
STROKE TEAM PROGRESS NOTE   INTERVAL HISTORY (subjective) He is having a bath in his bed.  No new neurological changes.  He remains on IV heparin.  No further bleeding through the urethra on heparin.  Patient has no new complaints today.  His vital signs are stable.  Vitals:   05/11/19 1542 05/11/19 1900 05/11/19 2321 05/12/19 0348  BP: 132/71 107/70 102/65 122/65  Pulse: 86 74 73 64  Resp: 18 18 18 18   Temp: 98.6 F (37 C) 97.9 F (36.6 C) (!) 97.5 F (36.4 C) 98.5 F (36.9 C)  TempSrc: Oral Axillary Oral Axillary  SpO2: 93% 97% 99% 94%  Weight:      Height:        CBC:  Recent Labs  Lab 05/10/19 0245 05/11/19 0334  WBC 9.7 14.3*  HGB 14.8 14.2  HCT 45.3 43.9  MCV 84.2 85.1  PLT 262 99991111   Basic Metabolic Panel:  Recent Labs  Lab 05/05/19 2316 05/06/19 0321  NA 135 134*  K 3.8 3.7  CL 100 98  CO2 24 25  GLUCOSE 110* 103*  BUN 22 21  CREATININE 0.70 0.69  CALCIUM 8.8* 8.8*  MG 1.8  --    Lipid Panel:     Component Value Date/Time   CHOL 102 05/01/2019 0630   CHOL 142 12/19/2014 0804   TRIG 98 05/01/2019 0630   HDL 25 (L) 05/01/2019 0630   HDL 29 (L) 12/19/2014 0804   CHOLHDL 4.1 05/01/2019 0630   VLDL 20 05/01/2019 0630   LDLCALC 57 05/01/2019 0630   LDLCALC 93 12/19/2014 0804   HgbA1c:  Lab Results  Component Value Date   HGBA1C 5.5 05/01/2019   Urine Drug Screen:     Component Value Date/Time   LABOPIA NONE DETECTED 04/30/2019 1507   COCAINSCRNUR NONE DETECTED 04/30/2019 1507   COCAINSCRNUR Negative 03/15/2015 1141   LABBENZ NONE DETECTED 04/30/2019 1507   AMPHETMU POSITIVE (A) 04/30/2019 1507   THCU NONE DETECTED 04/30/2019 1507   LABBARB NONE DETECTED 04/30/2019 1507    Alcohol Level     Component Value Date/Time   ETH <10 04/30/2019 1152    IMAGING past 24h No results found.  PHYSICAL EXAM      General - morbidly obese, well developed, not in acute distress.  Ophthalmologic - fundi not visualized due to  noncooperation.  Cardiovascular - irregularly irregular heart rate and rhythm.  Neuro - awake alert, still anarthric, not able to answer orientation questions but able to write them out on a note bad, he is fully oriented.. Follows all simple commands PERRL, right gaze full but incomplete left gaze, blinking to visual threat bilaterally. Bilateral facial weakness but left seems more than right. Tongue protrusion difficulty. RUE 5/5, LUE 4+/5 proximal and 4/5 finger grip. RLE 5/5. LLE 4+/5 proximal and 5-/5 distal. DTR 1+ and no babinski. Sensation symmetrical. Bilateral FTN intact. Gait not tested.    ASSESSMENT/PLAN Kyle [name] is a 63 y.o. male with history of a. Fib ( not anticoagulated) sick sinus syndrom, HTN, CVA, OSA ( not on CPAP) presenting with right side gaze and left arm paralysis.   Stroke:   R MCA infarct due to right MCA occlusion s/p IR w/ TICI2c revascularization -  Embolic econdary to known AF not on [facility]  Code Stroke CT head No acute abnormality. Probably hyperdense R MCA bifurcation. Old R temporal lobe and L frontal lobe infarcts. .     CTA head & neck suboptimal.  Occlusion R MCA at bifurcation.  CT perfusion large delayed perfusion R MCA. No core infarct.   Cerebral angio occluded R M1, TICI2c   MRI  R MCA infarct (operculum). B MCA encephalomalacia  MRA  Improved R MCA patency  2D Echo EF 60-65%. No source of embolus   LDL 57   HgbA1c 5.5   HIV neg  Lovenox 40 mg sq daily for VTE prophylaxis  aspirin 81 mg daily prior to admission, off IV heparin d/t bleeding from penis. Hold antiplatelets as well pending OR.  Therapy recommendations:  CIR->family prefer SNF for longer term care.  Disposition:  Pending.  Bed obtained. Need to address swallow proir to d/c   Atrial Fibrillation / SSS  Home anticoagulation:  not taking   Started on Xarelto 09/15/2018, documented not taking 04/23/2019 (family practice note) . Now off IV heparin d/t bleeding from  penis. Hold antiplatelets as well pending OR.  Bradycardia with pauses (2 seconds) - Dr Rory Percy saw 11/20 - pt asymptomatic - currently rate in 70's - will decrease metoprolol dose.   Hypertension  Home meds:  Lisinopril 20 . Treated w/ cleviprex, now off. Labetalol prn  . BP goal < 180 . BP stable now    . On lisinopril 10 to bid and metoprolol 12.5 bid . Long-term BP goal normotensive  Hyperlipidemia  Home meds:  Prescribed lipitor - ? taking  LDL 57, goal < 70  Now on lipitor 40  Continue statin at discharge  Dysphagia . Secondary to stroke . NPO . Speech on board . cortrak out and replaced w/ panda under fluoro.  . Need to address swallow plan prior to SNF placement . TF on hold for reported OR yesterday at 1300; now planned for today but pt wants to go home. Dr. Leonie Man discussed with him further and he is agreeable to stay. He cannot be discharged until a decision is made r/t his feeding plan (tube vs no tube) . Trauma / IR cannot place PEG d/t gastroplasty in 1992. Bariatric GS saw . For exp lap vs open G vs open J tube today.  Tobacco abuse  Current smoker  Smoking cessation counseling provided  Pt is willing to quit   Other Stroke Risk Factors  ETOH use, alcohol level <10, advised to drink no more than 2 drink(s) a day  Substance abuse UDS:  Amphetamines  POSITIVE  Morbid Obesity, Body mass index is 41.61 kg/m., hx failed open gastroplasty in 1993, recommend weight loss, diet and exercise as appropriate   Hx stroke/TIA by imaging - Old R temporal lobe and L frontal lobe infarcts - likely due to AFib  Obstructive sleep apnea, not on CPAP at home  PVD  Other Active Problems  Leukocytosis, resolved.   COPD  Hypokalemia, resolved  Chronic pain syndrome, chronic B LBP w/ R sided sciatica, Pain from recent fallo narcotics at home. Followed at [facility]  Adjustment d/o w/ mixed anxiety and depressed  mood  Bleeding from penis. No hx foley. IV heparin off. UA w/ > 50 RBC, 8-10 WBC, few bacteria. UCx w/ multiple species, suggest recollection.  Hospital day # 12  Patient continues to be anarthritic unable to speak with severe dysphagia requiring tube feeds.  He he will likely get surgical PEG tube placement next week now.  Continue tube feeds and current ongoing stroke management.  Continue IV heparin and watch for urethral bleeding.  Greater than 50% time during this 25-minute visit  was spent on counseling and coordination of care and discussion with care team. Kyle Contras, MD To contact Stroke Continuity provider, please refer to [email or web address] After hours, contact General Neurology

## 2019-05-12 NOTE — TOC Progression Note (Signed)
Transition of Care [facility]) - Progression Note    Patient Details  Name: Kyle [name] MRN: [id number] Date of Birth: 09-03-1955  Transition of Care [facility]) CM/SW [place], LCSW Phone Number: 05/12/2019, 9:34 AM  Clinical Narrative:    TOC team continuing to follow for patient rehab needs once medical stability improves.    Expected Discharge Plan: [place] Barriers to Discharge: Continued Medical Work up, Ship broker  Expected Discharge Plan and Services Expected Discharge Plan: [place] arrangements for the past 2 months: Single Family Home                                       Social Determinants of Health (SDOH) Interventions    Readmission Risk Interventions No flowsheet data found.

## 2019-05-12 NOTE — Progress Notes (Signed)
Physical Therapy Treatment Patient Details Name: Kyle [name] MRN: [id number] DOB: 10-Mar-1956 Today's Date: 05/12/2019    History of Present Illness Kyle [name] is an 63 y.o. male  With PMH a. Fib ( not anticoagulated) sick sinus syndrom, HTN, CVA, OSA ( not on CPAP) who presented to [facility] as a code stroke. Pt found to have R MCA acute ischemic stroke due to right M1 occlusion, pt s/p mechanical thrombectomy.    PT Comments    Pt reports he just received pain meds and LE joints having pain, so he declined mobility at this time however eager for exercises.  Pt performed exercises to tolerance in supine.  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  Per MD note, family prefers SNF upon d/c for longer term care needs, so updated recommendations.    Follow Up Recommendations  SNF     Equipment Recommendations  Other (comment)(per next venue)    Recommendations for Other Services       Precautions / Restrictions Precautions Precautions: Fall Precaution Comments: strong L lateral lean    Mobility  Bed Mobility               General bed mobility comments: positioned bed to assist pt with scooting up HOB (pt down near foot of bed, bed also appears too short for pt); pt declined mobility due to just receiving pain meds and pain in joints in LEs however agreeable to exercises  Transfers                    Ambulation/Gait                 Stairs             Wheelchair Mobility    Modified Rankin (Stroke Patients Only)       Balance                                            Cognition Arousal/Alertness: Awake/alert Behavior During Therapy: WFL for tasks assessed/performed Overall Cognitive Status: Difficult to assess                         Following Commands: Follows one step commands consistently       General Comments: Pt continues to write on paper to  communicate which makes assesing cognition difficult.  He responds with head nods, pointing and gestures.      Exercises General Exercises - Lower Extremity Ankle Circles/Pumps: AROM;Both;10 reps Quad Sets: AROM;Both;10 reps Heel Slides: AROM;Both;10 reps Straight Leg Raises: AAROM;Both;10 reps Low Level/ICU Exercises Stabilized Bridging: AROM;Both;10 reps Other Exercises Other Exercises: pt used bed rails to assist with trunk flexion and sitting upright in bed x10    General Comments        Pertinent Vitals/Pain Pain Assessment: Faces Faces Pain Scale: Hurts little more Pain Location: bil knees and L hip (arthritis?) Pain Descriptors / Indicators: Aching;Sore Pain Intervention(s): Premedicated before session;Monitored during session;Repositioned    Home Living                      Prior Function            PT Goals (current goals can now be found in the care plan section) Acute Rehab PT Goals  PT Goal Formulation: With patient Time For Goal Achievement: 05/19/19 Potential to Achieve Goals: Fair Progress towards PT goals: Progressing toward goals    Frequency    Min 3X/week      PT Plan Discharge plan needs to be updated    Co-evaluation              AM-PAC PT "6 Clicks" Mobility   Outcome Measure  Help needed turning from your back to your side while in a flat bed without using bedrails?: A Lot Help needed moving from lying on your back to sitting on the side of a flat bed without using bedrails?: A Lot Help needed moving to and from a bed to a chair (including a wheelchair)?: A Lot Help needed standing up from a chair using your arms (e.g., wheelchair or bedside chair)?: A Little Help needed to walk in hospital room?: A Little Help needed climbing 3-5 steps with a railing? : A Little 6 Click Score: 15    End of Session   Activity Tolerance: Patient tolerated treatment well Patient left: in bed;with call bell/phone within reach Nurse  Communication: Mobility status PT Visit Diagnosis: Muscle weakness (generalized) (M62.81)     Time: [id number] PT Time Calculation (min) (ACUTE ONLY): 29 min  Charges:  $Therapeutic Exercise: 23-37 mins                     Carmelia Bake, PT, DPT Acute Rehabilitation Services Office: [phone] Pager: [phone]  Trena Platt 05/12/2019, 3:43 PM

## 2019-05-12 NOTE — Progress Notes (Signed)
ANTICOAGULATION CONSULT NOTE  Pharmacy Consult:  Heparin Indication: atrial fibrillation and stroke  Allergies  Allergen Reactions  . Vioxx [Rofecoxib] Rash    Patient Measurements: Height: 6\' 3"  (190.5 cm) Weight: (!) 332 lb 14.3 oz (151 kg) IBW/kg (Calculated) : 84.5 Heparin Dosing Weight: 121 kg  Vital Signs: Temp: 98 F (36.7 C) (11/27 0832) Temp Source: Oral (11/27 0832) BP: 124/75 (11/27 0832) Pulse Rate: 79 (11/27 0832)  Labs: Recent Labs    05/10/19 0245 05/11/19 0334 05/11/19 2201 05/12/19 0735  HGB 14.8 14.2  --  13.2  HCT 45.3 43.9  --  40.4  PLT 262 245  --  220  HEPARINUNFRC  --   --  0.17* 0.19*    Estimated Creatinine Clearance: 152.4 mL/min (by C-G formula based on SCr of 0.69 mg/dL).  Assessment: 62 YOM presented with right MCA infarct due to MCA occlusion s/p IR.  Patient has history of Afib with stroke likely d/t not being on anticoagulation.  Pharmacy consulted to dose IV heparin.  Heparin level is sub-therapeutic, unsure why since patient was previously therapeutic on 1800 units/hr and is currently on 1950 units/hr.  No issue with heparin infusion nor bleeding per RN.  Goal of Therapy:  Heparin level 0.3 - 0.5 units/ml Monitor platelets by anticoagulation protocol: Yes   Plan:  Increase heparin gtt to 2250 units/hr Check 6 hr heparin level Daily heparin level and CBC F/U plans for possible PEG, conversion to oral anticoagulant  Simeon Vera D. Mina Marble, PharmD, BCPS, [place] 05/12/2019, 9:33 AM

## 2019-05-12 NOTE — Progress Notes (Signed)
ANTICOAGULATION CONSULT NOTE - Follow Up Consult  Pharmacy Consult for Heparin Indication: atrial fibrillation  Allergies  Allergen Reactions  . Vioxx [Rofecoxib] Rash    Patient Measurements: Height: 6\' 3"  (190.5 cm) Weight: (!) 332 lb 14.3 oz (151 kg) IBW/kg (Calculated) : 84.5 Heparin Dosing Weight:    Vital Signs: Temp: 97.8 F (36.6 C) (11/27 1656) Temp Source: Oral (11/27 1656) BP: 145/83 (11/27 1656) Pulse Rate: 77 (11/27 1656)  Labs: Recent Labs    05/10/19 0245 05/11/19 0334 05/11/19 2201 05/12/19 0735 05/12/19 1614  HGB 14.8 14.2  --  13.2  --   HCT 45.3 43.9  --  40.4  --   PLT 262 245  --  220  --   HEPARINUNFRC  --   --  0.17* 0.19* 0.40    Estimated Creatinine Clearance: 152.4 mL/min (by C-G formula based on SCr of 0.69 mg/dL).   Assessment:   Anticoag: Heparin for Afib >> held for PEG but has significant bleeding from urethral 11/24; resumed 11/26, HL 0.19>>0.4 now in goal.  Goal of Therapy:  Heparin level 0.3-0.5 Monitor platelets by anticoagulation protocol: Yes   Plan:  Con't heparin at 2250 units/hr Daily heparin level and CBC   Icy Fuhrmann S. Alford Highland, PharmD, BCPS Clinical Staff Pharmacist Eilene Ghazi [place] 05/12/2019,6:17 PM

## 2019-05-12 NOTE — Progress Notes (Signed)
OT Cancellation Note  Patient Details Name: Kyle [name] MRN: [id number] DOB: 24-Nov-1955   Cancelled Treatment:    Reason Eval/Treat Not Completed: Other (comment) Met with pt asleep in bed. He states he is having significant leg pain, not wanting to participate. Noted he had less tolerance with PT this date as well. Will continue to follow as available and appropriate.  Zenovia Jarred, MSOT, OTR/L Behavioral Health OT/ [facility] Office: [place] 05/12/2019, 4:09 PM

## 2019-05-13 ENCOUNTER — Inpatient Hospital Stay (HOSPITAL_COMMUNITY): Payer: Medicare PPO

## 2019-05-13 DIAGNOSIS — I69391 Dysphagia following cerebral infarction: Secondary | ICD-10-CM

## 2019-05-13 DIAGNOSIS — R001 Bradycardia, unspecified: Secondary | ICD-10-CM

## 2019-05-13 DIAGNOSIS — I5022 Chronic systolic (congestive) heart failure: Secondary | ICD-10-CM

## 2019-05-13 LAB — CBC
HCT: 39.1 % (ref 39.0–52.0)
Hemoglobin: 12.9 g/dL — ABNORMAL LOW (ref 13.0–17.0)
MCH: 27.9 pg (ref 26.0–34.0)
MCHC: 33 g/dL (ref 30.0–36.0)
MCV: 84.4 fL (ref 80.0–100.0)
Platelets: 191 10*3/uL (ref 150–400)
RBC: 4.63 MIL/uL (ref 4.22–5.81)
RDW: 15.7 % — ABNORMAL HIGH (ref 11.5–15.5)
WBC: 8 10*3/uL (ref 4.0–10.5)
nRBC: 0 % (ref 0.0–0.2)

## 2019-05-13 LAB — GLUCOSE, CAPILLARY
Glucose-Capillary: 110 mg/dL — ABNORMAL HIGH (ref 70–99)
Glucose-Capillary: 98 mg/dL (ref 70–99)
Glucose-Capillary: 103 mg/dL — ABNORMAL HIGH (ref 70–99)
Glucose-Capillary: 83 mg/dL (ref 70–99)
Glucose-Capillary: 90 mg/dL (ref 70–99)

## 2019-05-13 LAB — BASIC METABOLIC PANEL
Anion gap: 11 (ref 5–15)
BUN: 13 mg/dL (ref 8–23)
CO2: 24 mmol/L (ref 22–32)
Calcium: 8.5 mg/dL — ABNORMAL LOW (ref 8.9–10.3)
Chloride: 100 mmol/L (ref 98–111)
Creatinine, Ser: 0.52 mg/dL — ABNORMAL LOW (ref 0.61–1.24)
GFR calc Af Amer: >60 mL/min (ref >60)
GFR calc non Af Amer: >60 mL/min (ref >60)
Glucose, Bld: 98 mg/dL (ref 70–99)
Potassium: 3.8 mmol/L (ref 3.5–5.1)
Sodium: 135 mmol/L (ref 135–145)

## 2019-05-13 LAB — HEPARIN LEVEL (UNFRACTIONATED)
Heparin Unfractionated: 0.29 IU/mL — ABNORMAL LOW (ref 0.30–0.70)
Heparin Unfractionated: 0.44 IU/mL (ref 0.30–0.70)

## 2019-05-13 IMAGING — DX DG CHEST 1V PORT
1 series · 1 of 1 positions shown · non-contrast
Comparison: [DATE]

CLINICAL DATA: Cardiomegaly

EXAM:
PORTABLE CHEST 1 VIEW

[chest ap]
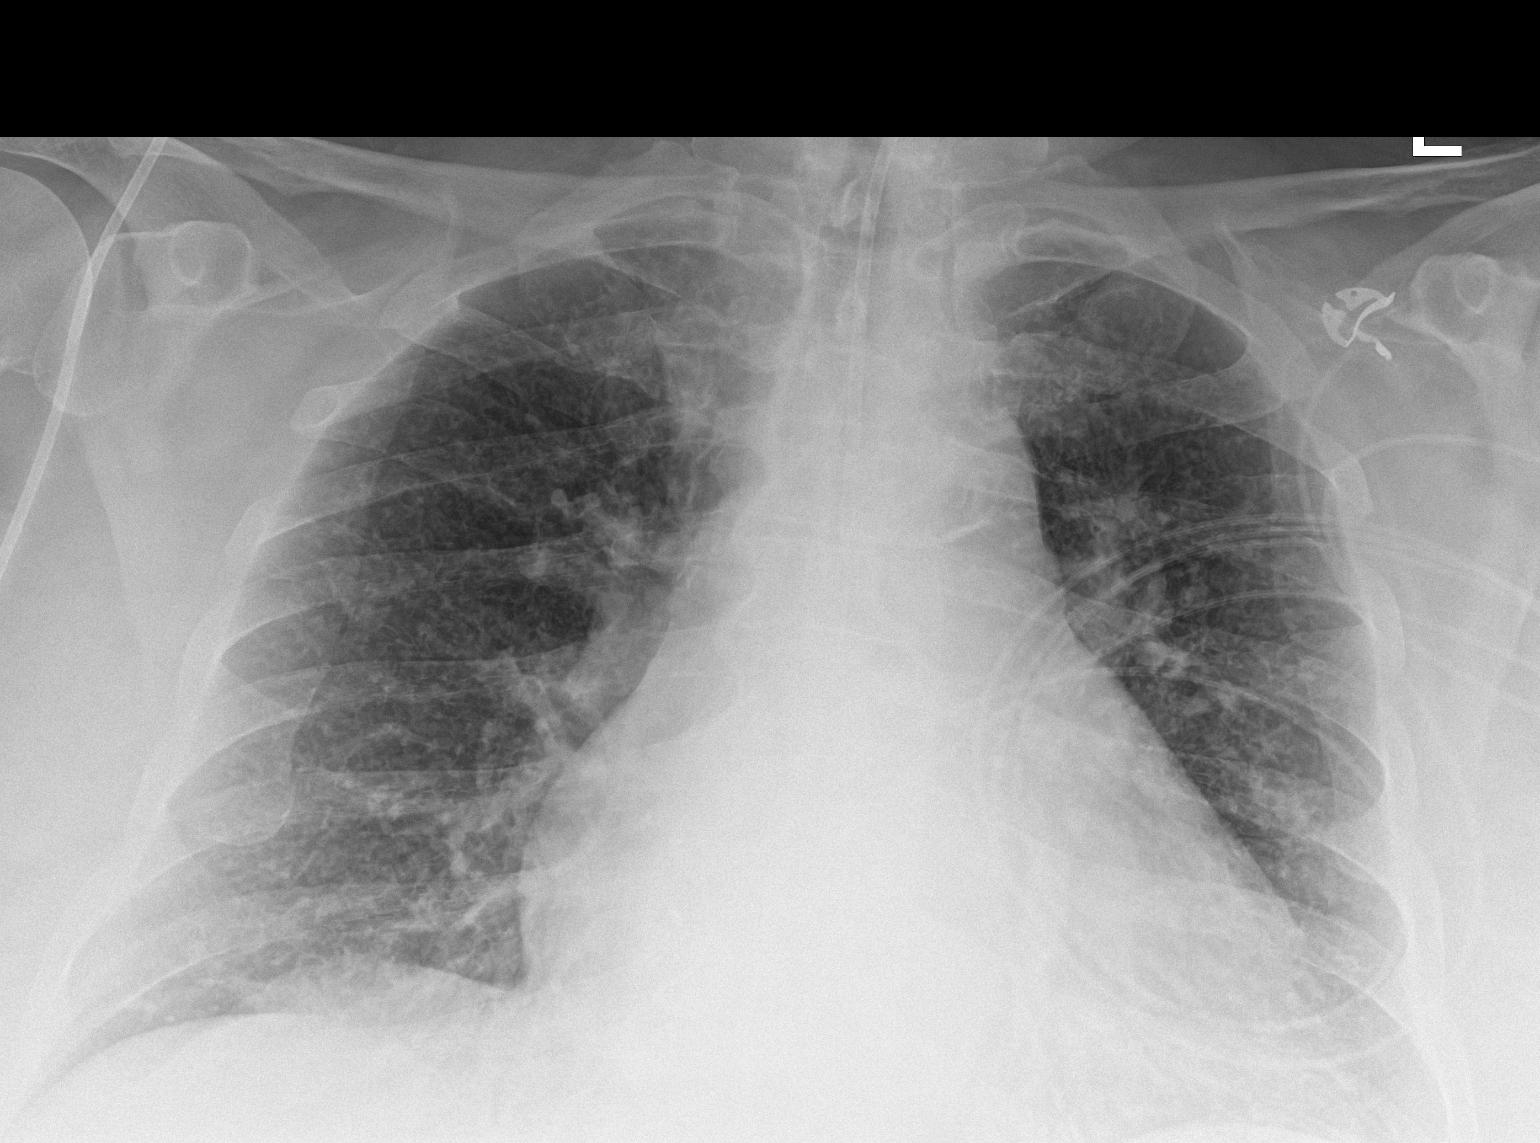

[1 of 1 positions shown; findings below may reference images not displayed]

FINDINGS: Cardiomegaly is stable. The pulmonary vascular is normal. There is
aortic atherosclerosis.

There is no evident edema or consolidation. No adenopathy. Feeding
tube tip is below the diaphragm. No bone lesions appreciable.
IMPRESSION: Stable cardiomegaly. No edema or consolidation. Feeding tube extends
below the diaphragm. Aortic Atherosclerosis ([69]-[69]).

## 2019-05-13 MED ORDER — DIPHENHYDRAMINE HCL 50 MG/ML IJ SOLN
25.0000 mg | Freq: Once | INTRAMUSCULAR | Status: AC
  Administered 2019-05-13: 25 mg via INTRAVENOUS
  Filled 2019-05-13: qty 1

## 2019-05-13 MED ORDER — DIPHENHYDRAMINE HCL 25 MG PO CAPS
50.0000 mg | ORAL_CAPSULE | Freq: Once | ORAL | Status: AC | PRN
  Administered 2019-05-13: 50 mg via ORAL
  Filled 2019-05-13: qty 2

## 2019-05-13 NOTE — Progress Notes (Signed)
STROKE TEAM PROGRESS NOTE   INTERVAL HISTORY He is sitting in bed, awake alert orientated, still has anarthria, but able to make sound intermittently, able to repeat "I am here" but not other sentences. Able to write to communicate, understood that he needs PEG to tube feeding. Moving all extremities.   Vitals:   05/12/19 1937 05/12/19 2359 05/13/19 0300 05/13/19 0728  BP: (!) 146/65 (!) 141/83 140/90 131/70  Pulse: 68 82 80 75  Resp: 19 20 18 16   Temp: 98.7 F (37.1 C) 98.9 F (37.2 C) 98.2 F (36.8 C) 97.8 F (36.6 C)  TempSrc: Oral Axillary Axillary Oral  SpO2: 100% 100% 99% 95%  Weight:   (!) 154 kg   Height:        CBC:  Recent Labs  Lab 05/12/19 0735 05/13/19 0336  WBC 10.7* 8.0  HGB 13.2 12.9*  HCT 40.4 39.1  MCV 84.0 84.4  PLT 220 [id number]   Basic Metabolic Panel:  Recent Labs  Lab 05/13/19 0336  NA 135  K 3.8  CL 100  CO2 24  GLUCOSE 98  BUN 13  CREATININE 0.52*  CALCIUM 8.5*   Lipid Panel:     Component Value Date/Time   CHOL 102 05/01/2019 0630   CHOL 142 12/19/2014 0804   TRIG 98 05/01/2019 0630   HDL 25 (L) 05/01/2019 0630   HDL 29 (L) 12/19/2014 0804   CHOLHDL 4.1 05/01/2019 0630   VLDL 20 05/01/2019 0630   LDLCALC 57 05/01/2019 0630   LDLCALC 93 12/19/2014 0804   HgbA1c:  Lab Results  Component Value Date   HGBA1C 5.5 05/01/2019   Urine Drug Screen:     Component Value Date/Time   LABOPIA NONE DETECTED 04/30/2019 1507   COCAINSCRNUR NONE DETECTED 04/30/2019 1507   COCAINSCRNUR Negative 03/15/2015 1141   LABBENZ NONE DETECTED 04/30/2019 1507   AMPHETMU POSITIVE (A) 04/30/2019 1507   THCU NONE DETECTED 04/30/2019 1507   LABBARB NONE DETECTED 04/30/2019 1507    Alcohol Level     Component Value Date/Time   ETH <10 04/30/2019 1152    IMAGING past 24h Dg Chest Port 1 View  Result Date: 05/13/2019 CLINICAL DATA:  Cardiomegaly EXAM: PORTABLE CHEST 1 VIEW COMPARISON:  April 30, 2019 FINDINGS: Cardiomegaly is stable. The  pulmonary vascular is normal. There is aortic atherosclerosis. There is no evident edema or consolidation. No adenopathy. Feeding tube tip is below the diaphragm. No bone lesions appreciable. IMPRESSION: Stable cardiomegaly. No edema or consolidation. Feeding tube extends below the diaphragm. Aortic Atherosclerosis (ICD10-I70.0). Electronically Signed   By: Lowella Grip III M.D.   On: 05/13/2019 08:29    PHYSICAL EXAM      General - morbidly obese, well developed, not in acute distress.  Ophthalmologic - fundi not visualized due to noncooperation.  Cardiovascular - irregularly irregular heart rate and rhythm.  Neuro - awake alert, still anarthric, not able to answer orientation questions but able to write them out on a note bad, he is fully oriented. Able to repeat "I am here", very dysarthric but not able to repeat other sentences. Follows all simple commands PERRL, EOMI, blinking to visual threat bilaterally. Bilateral facial weakness but left seems more than right. Tongue protrusion difficulty. RUE 5/5, LUE 4+/5 proximal and 4/5 finger grip. RLE 5/5. LLE 4+/5 proximal and 5-/5 distal. DTR 1+ and no babinski. Sensation symmetrical. Bilateral FTN intact. Gait not tested.    ASSESSMENT/PLAN Kyle [name] is a 63 y.o. male with history of  a. Fib ( not anticoagulated) sick sinus syndrom, HTN, CVA, OSA ( not on CPAP) presenting with right side gaze and left arm paralysis.   Stroke: R MCA infarct due to right MCA occlusion s/p IR w/ TICI2c revascularization -  Embolic econdary to known AF not on [facility]  Code Stroke CT head No acute abnormality. Probably hyperdense R MCA bifurcation. Old R temporal lobe and L frontal lobe infarcts. .     CTA head & neck suboptimal. Occlusion R MCA at bifurcation.  CT perfusion large delayed perfusion R MCA. No core infarct.   Cerebral angio occluded R M1, TICI2c   MRI  R MCA infarct (operculum). B MCA encephalomalacia  MRA  Improved R MCA patency  2D  Echo EF 60-65%. No source of embolus   LDL 57   HgbA1c 5.5   HIV neg  Lovenox 40 mg sq daily for VTE prophylaxis  aspirin 81 mg daily prior to admission, off IV heparin d/t bleeding from penis, but resumed 11/26.  Therapy recommendations:  CIR->family prefer SNF  Disposition:  Pending.  Bed obtained. Need to address swallow proir to d/c   Atrial Fibrillation / SSS  Home anticoagulation:  not taking   Started on Xarelto 09/15/2018, documented not taking 04/23/2019 (family practice note)  off IV heparin d/t bleeding from penis, resumed 11/26.  Bradycardia with pauses (2 seconds) - Dr Rory Percy saw 11/20 - pt asymptomatic - currently rate in 70's - decreased metoprolol dose.   Hypertension  Home meds:  Lisinopril 20 . Treated w/ cleviprex, now off. Labetalol prn  . BP goal < 180 . BP stable now    . On lisinopril 10 to bid and metoprolol 12.5 bid . Long-term BP goal normotensive  Hyperlipidemia  Home meds:  Prescribed lipitor - ? taking  LDL 57, goal < 70  Now on lipitor 40  Continue statin at discharge  Dysphagia . Secondary to stroke . NPO . Speech on board . Need to address swallow plan prior to SNF placement ->Still NPO per speech therapy recommendation. On tube feedings. . Trauma / IR cannot place PEG d/t gastroplasty in 1992. Bariatric GS saw. For exp lap vs open G vs open J tube. . Pending PEG placement - surgery on board  Tobacco abuse  Current smoker  Smoking cessation counseling provided  Pt is willing to quit   Other Stroke Risk Factors  ETOH use, alcohol level <10, advised to drink no more than 2 drink(s) a day  Substance abuse - UDS positive for Amphetamines   Morbid Obesity, Body mass index is 42.44 kg/m., hx failed open gastroplasty in 1993, recommend weight loss, diet and exercise as appropriate   Hx stroke/TIA by imaging - Old R temporal lobe and L frontal lobe infarcts - likely due to AFib  Obstructive sleep apnea, not on CPAP at  home  PVD   Other Active Problems  Leukocytosis, resolved.   COPD  Hypokalemia, resolved  Chronic pain syndrome, chronic B LBP w/ R sided sciatica, Pain from recent fall on narcotics at home. Followed at [facility] [place]  Adjustment d/o w/ mixed anxiety and depressed mood  Hospital day # 13  [name], MD PhD Stroke Neurology 05/13/2019 2:42 PM   To contact Stroke Continuity provider, please refer to [email or web address] After hours, contact General Neurology

## 2019-05-13 NOTE — Discharge Summary (Addendum)
Patient ID: Kyle [name]   MRN: [place]:[id number]      DOB: [birth year]  Date of Admission: 04/30/2019 Date of Discharge: 05/24/2019    Attending Physician:  Garvin Fila, MD, Stroke MD Consultant(s): Surgical Consult - Harl Bowie, MD ; Interventional Radiology Consult - Luanne Bras, MD ; PM&R Consult - Kirsteins, Luanna Salk, MD Patient's PCP:  [name], MD  DISCHARGE DIAGNOSIS:   Right MCA infarct - embolic - secondary to atrial fibrillation without anticoagulation - treated with mechanical thrombectomy of right MCA occlusion by Dr Estanislado Pandy w/ TICI2c revascularization.  Active Problems:  Stroke [facility])  Middle cerebral artery embolism, right  Atrial fibrillation  Bradycardia (hx of SSS)  Dysphagia  Anarthria  J tube placement  Tube feedings  Anticoagulation  Leukocytosis  Hypokalemia  Hematuria  Bleeding from penis, resolved  Chronic pain  COPD  Substance abuse - UDS positive for amphetamines  Obesity  Anxiety and depression  Chronic pain syndrome  obstructive sleep apnea   Past Medical History:  Diagnosis Date  . Arthritis   . Atrial fibrillation    HX OF SICK SINUS SYNDROME-ATRIAL FIB  . Back pain, chronic    PT STATES 5 BULGING DISKS AND PINCHED NERVES--PT ON OXYCODONE 4 TIMES A DAY FOR HIS PAIN  . Chest pain 08/26/2015  . Chronic airway obstruction, not elsewhere classified 12/21/2012  . GERD (gastroesophageal reflux disease)   . Hypertension   . Impaired fasting glucose 04/19/2013  . Peripheral vascular disease (HCC)    CHRONIC VENOUS INSUFFICIENCY  . Shortness of breath   . 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 Stroke ([place])   . Wears dentures    Past Surgical History:  Procedure Laterality Date  . ESOPHAGOGASTRODUODENOSCOPY  05/18/2011   Procedure: ESOPHAGOGASTRODUODENOSCOPY (EGD);  Surgeon: Shann Medal, MD;  Location: [facility];   Service: Endoscopy;  Laterality: N/A;  . FOOT SURGERY  2003   right foot surgery from work accident   . GASTRIC BYPASS  10/19/11  . GASTRIC RESTRICTION SURGERY  1992  . IR ANGIO VERTEBRAL SEL SUBCLAVIAN INNOMINATE UNI R MOD SED  04/30/2019  . IR CT HEAD LTD  04/30/2019  . IR PERCUTANEOUS ART THROMBECTOMY/INFUSION INTRACRANIAL INC DIAG ANGIO  04/30/2019  . LAPAROSCOPIC INSERTION GASTROSTOMY TUBE N/A 05/16/2019   Procedure: LAPAROSCOPIC JEJUNOSTOMY TUBE;  Surgeon: Ralene Ok, MD;  Location: [place];  Service: General;  Laterality: N/A;  . LEG SURGERY  1998   calcium deposit removed on right  leg   . RADIOLOGY WITH ANESTHESIA N/A 04/30/2019   Procedure: CODE STROKE;  Surgeon: Radiologist, Medication, MD;  Location: [place];  Service: Radiology;  Laterality: N/A;   Family History  Problem Relation Age of Onset  . Cancer Mother        melanoma  . Heart disease Mother   . Rheum arthritis Mother   . Lung cancer Father        melanoma  . Heart disease Father   . Heart disease Sister     reports that he has been smoking cigarettes. He has a 20.00 pack-year smoking history. He has never used smokeless tobacco. He reports current alcohol use. He reports that he does not use drugs.  Allergies as of 05/24/2019      Reactions   Vioxx [rofecoxib] Rash      Medication List    STOP taking  these medications   amitriptyline 50 MG tablet Commonly known as: ELAVIL   aspirin EC 81 MG tablet   atorvastatin 20 MG tablet Commonly known as: LIPITOR   atorvastatin 40 MG tablet Commonly known as: LIPITOR   gabapentin 600 MG tablet Commonly known as: NEURONTIN Replaced by: gabapentin 250 MG/5ML solution   lisinopril 10 MG tablet Commonly known as: ZESTRIL   lisinopril 20 MG tablet Commonly known as: ZESTRIL   metoprolol succinate 100 MG 24 hr tablet Commonly known as: TOPROL-XL   nystatin cream Commonly known as: MYCOSTATIN   oxyCODONE 5 MG immediate release tablet Commonly known as:  Oxy IR/ROXICODONE Replaced by: oxyCODONE 5 MG/5ML solution   pantoprazole 40 MG tablet Commonly known as: PROTONIX Replaced by: pantoprazole sodium 40 mg/20 mL Pack   PARoxetine 10 MG tablet Commonly known as: PAXIL     TAKE these medications   albuterol 108 (90 Base) MCG/ACT inhaler Commonly known as: VENTOLIN HFA Inhale 2 puffs into the lungs every 4 (four) hours as needed for wheezing or shortness of breath.   enoxaparin 150 MG/ML injection Commonly known as: LOVENOX Inject 0.87 mLs (130 mg total) into the skin every 12 (twelve) hours.   feeding supplement (JEVITY 1.2 CAL) Liqd Place 1,000 mLs into feeding tube continuous.   feeding supplement (PRO-STAT SUGAR FREE 64) Liqd Place 30 mLs into feeding tube 3 (three) times daily.   free water Soln Place 100 mLs into feeding tube every 6 (six) hours.   gabapentin 250 MG/5ML solution Commonly known as: NEURONTIN Place 12 mLs (600 mg total) into feeding tube 4 (four) times daily. Replaces: gabapentin 600 MG tablet   metoprolol tartrate 25 mg/10 mL Susp Commonly known as: LOPRESSOR Place 5 mLs (12.5 mg total) into feeding tube 2 (two) times daily.   oxyCODONE 5 MG/5ML solution Commonly known as: ROXICODONE Place 7.5 mLs (7.5 mg total) into feeding tube every 3 (three) hours as needed for severe pain or breakthrough pain. Replaces: oxyCODONE 5 MG immediate release tablet   pantoprazole sodium 40 mg/20 mL Pack Commonly known as: PROTONIX Place 20 mLs (40 mg total) into feeding tube daily. Replaces: pantoprazole 40 MG tablet       HOME MEDICATIONS PRIOR TO ADMISSION Medications Prior to Admission  Medication Sig Dispense Refill  . albuterol (PROVENTIL HFA;VENTOLIN HFA) 108 (90 Base) MCG/ACT inhaler Inhale 2 puffs into the lungs every 4 (four) hours as needed for wheezing or shortness of breath. 1 Inhaler 5  . amitriptyline (ELAVIL) 50 MG tablet TAKE 1 TABLET BY MOUTH EVERYDAY AT BEDTIME (Patient taking differently: Take  50 mg by mouth at bedtime. ) 30 tablet 0  . aspirin EC 81 MG tablet Take 81 mg by mouth daily.    Marland Kitchen atorvastatin (LIPITOR) 20 MG tablet Take 1 tablet (20 mg total) by mouth daily. For cholesterol. (Patient not taking: Reported on 04/30/2019) 90 tablet 3  . atorvastatin (LIPITOR) 40 MG tablet Take 40 mg by mouth daily.    Marland Kitchen gabapentin (NEURONTIN) 600 MG tablet TAKE 1 TABLET BY MOUTH THREE TIMES A DAY (Patient taking differently: Take 600 mg by mouth 4 (four) times daily. ) 90 tablet 0  . lisinopril (PRINIVIL,ZESTRIL) 20 MG tablet Take 1 tablet (20 mg total) by mouth daily. For blood pressure. (Patient not taking: Reported on 04/30/2019) 90 tablet 0  . lisinopril (ZESTRIL) 10 MG tablet Take 10 mg by mouth daily.    . metoprolol succinate (TOPROL-XL) 100 MG 24 hr tablet Take 100 mg by  mouth daily.    Marland Kitchen nystatin cream (MYCOSTATIN)     . oxyCODONE (OXY IR/ROXICODONE) 5 MG immediate release tablet Take 5 mg by mouth 4 (four) times daily as needed (pain).     . pantoprazole (PROTONIX) 40 MG tablet Take 40 mg by mouth daily.    Marland Kitchen PARoxetine (PAXIL) 10 MG tablet Take 10 mg by mouth daily.       HOSPITAL MEDICATIONS .  stroke: mapping our early stages of recovery book   Does not apply Once  . albuterol  3 mL Inhalation BID  . chlorhexidine  15 mL Mouth Rinse BID  . Chlorhexidine Gluconate Cloth  6 each Topical Daily  . enoxaparin (LOVENOX) injection  130 mg Subcutaneous Q12H  . feeding supplement (PRO-STAT SUGAR FREE 64)  30 mL Per Tube TID  . free water  100 mL Per Tube Q6H  . gabapentin  600 mg Per Tube QID  . insulin aspart  0-9 Units Subcutaneous Q4H  . mouth rinse  15 mL Mouth Rinse q12n4p  . metoprolol tartrate  12.5 mg Per Tube BID  . pantoprazole sodium  40 mg Per Tube Daily    LABORATORY STUDIES  CBC    Component Value Date/Time   WBC 8.9 05/20/2019 0249   RBC 5.32 05/20/2019 0249   HGB 14.7 05/20/2019 0249   HCT 45.8 05/20/2019 0249   PLT 267 05/20/2019 0249   MCV 86.1  05/20/2019 0249   MCH 27.6 05/20/2019 0249   MCHC 32.1 05/20/2019 0249   RDW 15.9 (H) 05/20/2019 0249   RDW 15.8 (H) 06/25/2014 0818   LYMPHSABS 1.1 05/01/2019 0630   LYMPHSABS 1.1 06/25/2014 0818   MONOABS 0.9 05/01/2019 0630   EOSABS 0.1 05/01/2019 0630   EOSABS 0.2 06/25/2014 0818   BASOSABS 0.0 05/01/2019 0630   BASOSABS 0.0 06/25/2014 0818   CMP    Component Value Date/Time   NA 138 05/20/2019 0249   NA 139 12/19/2014 0804   K 3.8 05/20/2019 0249   CL 100 05/20/2019 0249   CO2 28 05/20/2019 0249   GLUCOSE 101 (H) 05/20/2019 0249   BUN 19 05/20/2019 0249   BUN 16 12/19/2014 0804   CREATININE 0.72 05/20/2019 0249   CREATININE 0.75 06/20/2011 1032   CALCIUM 9.1 05/20/2019 0249   PROT 7.2 04/30/2019 1152   PROT 6.9 12/19/2014 0804   ALBUMIN 3.5 04/30/2019 1152   ALBUMIN 4.2 12/19/2014 0804   AST 24 04/30/2019 1152   ALT 16 04/30/2019 1152   ALKPHOS 77 04/30/2019 1152   BILITOT 1.0 04/30/2019 1152   BILITOT 0.8 12/19/2014 0804   GFRNONAA >60 05/20/2019 0249   GFRAA >60 05/20/2019 0249   COAGS Lab Results  Component Value Date   INR 1.1 04/30/2019   INR 1.3 (H) 01/22/2019   INR 1.3 (H) 01/21/2019   Lipid Panel    Component Value Date/Time   CHOL 102 05/01/2019 0630   CHOL 142 12/19/2014 0804   TRIG 98 05/01/2019 0630   HDL 25 (L) 05/01/2019 0630   HDL 29 (L) 12/19/2014 0804   CHOLHDL 4.1 05/01/2019 0630   VLDL 20 05/01/2019 0630   LDLCALC 57 05/01/2019 0630   LDLCALC 93 12/19/2014 0804   HgbA1C  Lab Results  Component Value Date   HGBA1C 5.5 05/01/2019   Urinalysis    Component Value Date/Time   COLORURINE AMBER (A) 05/22/2019 1543   APPEARANCEUR CLOUDY (A) 05/22/2019 1543   LABSPEC 1.024 05/22/2019 1543   PHURINE 5.0 05/22/2019 1543  GLUCOSEU NEGATIVE 05/22/2019 1543   HGBUR LARGE (A) 05/22/2019 1543   BILIRUBINUR NEGATIVE 05/22/2019 1543   KETONESUR NEGATIVE 05/22/2019 1543   PROTEINUR 100 (A) 05/22/2019 1543   NITRITE NEGATIVE 05/22/2019  1543   LEUKOCYTESUR LARGE (A) 05/22/2019 1543   Urine Drug Screen     Component Value Date/Time   LABOPIA NONE DETECTED 04/30/2019 1507   COCAINSCRNUR NONE DETECTED 04/30/2019 1507   COCAINSCRNUR Negative 03/15/2015 1141   LABBENZ NONE DETECTED 04/30/2019 1507   AMPHETMU POSITIVE (A) 04/30/2019 1507   THCU NONE DETECTED 04/30/2019 1507   LABBARB NONE DETECTED 04/30/2019 1507    Alcohol Level    Component Value Date/Time   ETH <10 04/30/2019 1152     SIGNIFICANT DIAGNOSTIC STUDIES   Ct Code Stroke Cta Head W/wo Contrast  Result Date: 04/30/2019 CLINICAL DATA:  Stroke. Left-sided weakness. Atrial fibrillation off anticoagulation. EXAM: CT ANGIOGRAPHY HEAD AND NECK CT PERFUSION BRAIN TECHNIQUE: Multidetector CT imaging of the head and neck was performed using the standard protocol during bolus administration of intravenous contrast. Multiplanar CT image reconstructions and MIPs were obtained to evaluate the vascular anatomy. Carotid stenosis measurements (when applicable) are obtained utilizing NASCET criteria, using the distal internal carotid diameter as the denominator. Multiphase CT imaging of the brain was performed following IV bolus contrast injection. Subsequent parametric perfusion maps were calculated using RAPID software. CONTRAST:  187mL OMNIPAQUE IOHEXOL 350 MG/ML SOLN COMPARISON:  CT head 04/30/2019 FINDINGS: CTA NECK FINDINGS Aortic arch: Suboptimal arterial opacification. Contrast is in the venous phase with limited arterial contrast. Atherosclerotic disease in the aorta. Proximal great vessels patent. Image interpretation delayed as patient was registered under the wrong name. Right carotid system: Mild atherosclerotic disease right carotid bifurcation without significant stenosis Left carotid system: Mild atherosclerotic disease left bifurcation without significant stenosis. Vertebral arteries: Both vertebral arteries are patent. Limited contrast opacification. Skeleton: No  acute abnormality. Other neck: Negative for mass or adenopathy. Upper chest: Negative Review of the MIP images confirms the above findings CTA HEAD FINDINGS Anterior circulation: Mild atherosclerotic disease in the cavernous carotid bilaterally. Right MCA occlusion at the bifurcation. Chronic infarct right temporal lobe. Both anterior cerebral arteries patent. Left middle cerebral artery widely patent. Posterior circulation: Vertebral arteries not well visualized. Basilar artery not well visualized due to lack of adequate contrast. Fetal origin of the posterior cerebral arteries bilaterally Venous sinuses: No venous contrast present. Anatomic variants: None Review of the MIP images confirms the above findings CT Brain Perfusion Findings: ASPECTS: 10 CBF (<30%) Volume: 69mL Perfusion (Tmax>6.0s) volume: 281mL Mismatch Volume: 02/01/2019mL Infarction Location:Right MCA territory involving the right lateral temporal lobe and parietal lobe. IMPRESSION: Suboptimal CTA. Delayed interpretation due to registration under the wrong name. Very early arterial phase study with limited arterial opacification in the CTA. As best I can tell both carotid arteries are patent in the neck. There is occlusion of the right MCA at the bifurcation. CT perfusion markedly abnormal with large area of delayed perfusion right MCA territory. 219 mL of delayed perfusion. No core infarct. These results were called by telephone at the time of interpretation on 04/30/2019 at 12:30 pm to provider Aroor, who verbally acknowledged these results. Electronically Signed   By: Franchot Gallo M.D.   On: 04/30/2019 12:33   Dg Tibia/fibula Right  Result Date: 04/21/2019 CLINICAL DATA:  Persistent pain after fall EXAM: RIGHT TIBIA AND FIBULA - 2 VIEW COMPARISON:  None. FINDINGS: There is questionable step-off/fracture deformity at the posterior malleolus of the distal tibia  on the lateral view. Otherwise no fracture or malalignment is seen. IMPRESSION:  Questionable lucency/deformity at the posterior malleolus of the distal tibia. Correlate for point tenderness to the region Electronically Signed   By: Donavan Foil M.D.   On: 04/21/2019 20:23   Xr Ankle R  Result Date: 04/21/2019 CLINICAL DATA:  Fall, pain EXAM: RIGHT FOOT COMPLETE - 3+ VIEW; RIGHT ANKLE - COMPLETE 3+ VIEW COMPARISON:  11/24/2013 FINDINGS: No fracture or dislocation of the right foot or or ankle. Redemonstrated postoperative findings of partial right fifth metatarsal resection. Mild osteoarthritic pattern arthrosis throughout. Soft tissues are unremarkable. IMPRESSION: No fracture or dislocation of the right foot or or ankle. Electronically Signed   By: Eddie Candle M.D.   On: 04/21/2019 19:15   Dg Abd 1 View  Result Date: 05/08/2019 CLINICAL DATA:  Status post feeding tube placement. EXAM: ABDOMEN - 1 VIEW COMPARISON:  None. FLUOROSCOPY TIME:  1 minute, 30 seconds. FINDINGS: A feeding tube was placed under fluoroscopy by radiology technologist Elvera Lennox with its tip left in the distal stomach. IMPRESSION: As above. Electronically Signed   By: Inge Rise M.D.   On: 05/08/2019 15:30   Dg Abd 1 View  Result Date: 05/02/2019 CLINICAL DATA:  NG tube placement, history of gastric bypass EXAM: ABDOMEN - 1 VIEW; DG NASO G TUBE PLC W/FL-NO RAD CONTRAST:  34mL OMNIPAQUE IOHEXOL 300 MG/ML  SOLN FLUOROSCOPY TIME:  Fluoroscopy Time:  5 minutes 12 seconds Number of Acquired Spot Images: 1 COMPARISON:  None. FINDINGS: Enteric tube is not well seen but injected contrast opacifies the lumen of the stomach. IMPRESSION: Enteric tube placement within the stomach. Electronically Signed   By: Macy Mis M.D.   On: 05/02/2019 15:31   Ct Head Wo Contrast  Result Date: 04/21/2019 CLINICAL DATA:  Fall EXAM: CT HEAD WITHOUT CONTRAST CT CERVICAL SPINE WITHOUT CONTRAST TECHNIQUE: Multidetector CT imaging of the head and cervical spine was performed following the standard protocol without  intravenous contrast. Multiplanar CT image reconstructions of the cervical spine were also generated. COMPARISON:  08/25/2015 FINDINGS: CT HEAD FINDINGS Brain: No evidence of acute infarction, hemorrhage, hydrocephalus, extra-axial collection or mass lesion/mass effect. Periventricular white matter hypodensity and unchanged left frontal and right temporal MCA territory encephalomalacia bilaterally. Vascular: No hyperdense vessel or unexpected calcification. Skull: Normal. Negative for fracture or focal lesion. Sinuses/Orbits: No acute finding. Other: None. CT CERVICAL SPINE FINDINGS Alignment: Normal. Skull base and vertebrae: No acute fracture. No primary bone lesion or focal pathologic process. Soft tissues and spinal canal: No prevertebral fluid or swelling. No visible canal hematoma. Disc levels: Moderate multilevel disc space height loss and osteophytosis. Upper chest: Negative. Other: None. IMPRESSION: 1. No acute intracranial pathology. Unchanged small-vessel white matter disease and bilateral MCA territory encephalomalacia. 2. No fracture or static subluxation of the cervical spine. Moderate multilevel disc degenerative disease and osteophytosis. Electronically Signed   By: Eddie Candle M.D.   On: 04/21/2019 19:09   Ct Code Stroke Cta Neck W/wo Contrast  Result Date: 04/30/2019 CLINICAL DATA:  Stroke. Left-sided weakness. Atrial fibrillation off anticoagulation. EXAM: CT ANGIOGRAPHY HEAD AND NECK CT PERFUSION BRAIN TECHNIQUE: Multidetector CT imaging of the head and neck was performed using the standard protocol during bolus administration of intravenous contrast. Multiplanar CT image reconstructions and MIPs were obtained to evaluate the vascular anatomy. Carotid stenosis measurements (when applicable) are obtained utilizing NASCET criteria, using the distal internal carotid diameter as the denominator. Multiphase CT imaging of the brain was performed  following IV bolus contrast injection. Subsequent  parametric perfusion maps were calculated using RAPID software. CONTRAST:  145mL OMNIPAQUE IOHEXOL 350 MG/ML SOLN COMPARISON:  CT head 04/30/2019 FINDINGS: CTA NECK FINDINGS Aortic arch: Suboptimal arterial opacification. Contrast is in the venous phase with limited arterial contrast. Atherosclerotic disease in the aorta. Proximal great vessels patent. Image interpretation delayed as patient was registered under the wrong name. Right carotid system: Mild atherosclerotic disease right carotid bifurcation without significant stenosis Left carotid system: Mild atherosclerotic disease left bifurcation without significant stenosis. Vertebral arteries: Both vertebral arteries are patent. Limited contrast opacification. Skeleton: No acute abnormality. Other neck: Negative for mass or adenopathy. Upper chest: Negative Review of the MIP images confirms the above findings CTA HEAD FINDINGS Anterior circulation: Mild atherosclerotic disease in the cavernous carotid bilaterally. Right MCA occlusion at the bifurcation. Chronic infarct right temporal lobe. Both anterior cerebral arteries patent. Left middle cerebral artery widely patent. Posterior circulation: Vertebral arteries not well visualized. Basilar artery not well visualized due to lack of adequate contrast. Fetal origin of the posterior cerebral arteries bilaterally Venous sinuses: No venous contrast present. Anatomic variants: None Review of the MIP images confirms the above findings CT Brain Perfusion Findings: ASPECTS: 10 CBF (<30%) Volume: 27mL Perfusion (Tmax>6.0s) volume: 216mL Mismatch Volume: 02/01/2019mL Infarction Location:Right MCA territory involving the right lateral temporal lobe and parietal lobe. IMPRESSION: Suboptimal CTA. Delayed interpretation due to registration under the wrong name. Very early arterial phase study with limited arterial opacification in the CTA. As best I can tell both carotid arteries are patent in the neck. There is occlusion of the  right MCA at the bifurcation. CT perfusion markedly abnormal with large area of delayed perfusion right MCA territory. 219 mL of delayed perfusion. No core infarct. These results were called by telephone at the time of interpretation on 04/30/2019 at 12:30 pm to provider Aroor, who verbally acknowledged these results. Electronically Signed   By: Franchot Gallo M.D.   On: 04/30/2019 12:33   Ct Cervical Spine Wo Contrast  Result Date: 04/21/2019 CLINICAL DATA:  Fall EXAM: CT HEAD WITHOUT CONTRAST CT CERVICAL SPINE WITHOUT CONTRAST TECHNIQUE: Multidetector CT imaging of the head and cervical spine was performed following the standard protocol without intravenous contrast. Multiplanar CT image reconstructions of the cervical spine were also generated. COMPARISON:  08/25/2015 FINDINGS: CT HEAD FINDINGS Brain: No evidence of acute infarction, hemorrhage, hydrocephalus, extra-axial collection or mass lesion/mass effect. Periventricular white matter hypodensity and unchanged left frontal and right temporal MCA territory encephalomalacia bilaterally. Vascular: No hyperdense vessel or unexpected calcification. Skull: Normal. Negative for fracture or focal lesion. Sinuses/Orbits: No acute finding. Other: None. CT CERVICAL SPINE FINDINGS Alignment: Normal. Skull base and vertebrae: No acute fracture. No primary bone lesion or focal pathologic process. Soft tissues and spinal canal: No prevertebral fluid or swelling. No visible canal hematoma. Disc levels: Moderate multilevel disc space height loss and osteophytosis. Upper chest: Negative. Other: None. IMPRESSION: 1. No acute intracranial pathology. Unchanged small-vessel white matter disease and bilateral MCA territory encephalomalacia. 2. No fracture or static subluxation of the cervical spine. Moderate multilevel disc degenerative disease and osteophytosis. Electronically Signed   By: Eddie Candle M.D.   On: 04/21/2019 19:09   Mr Jodene Nam Head Wo Contrast  Result Date:  05/01/2019 CLINICAL DATA:  63 year old male code stroke presentation with right MCA bifurcation occlusion treated with endovascular reperfusion. EXAM: MRA HEAD WITHOUT CONTRAST TECHNIQUE: Angiographic images of the Circle of Willis were obtained using MRA technique without intravenous contrast. COMPARISON:  MRI performed at the same time. CTA head and neck and CT Perfusion 04/30/2019. FINDINGS: Study is mildly degraded by motion artifact despite repeated imaging attempts. Motion degraded distal vertebral arteries, but patent basilar artery with no definite stenosis. The basilar functionally terminates in the SCA is with fetal type bilateral PCA origins. Symmetric bilateral PCA flow signal. Motion degraded proximal ICA siphons. Both ICAs are patent with symmetric flow signal at the carotid termini. Patent MCA and ACA origins. Visible bilateral ACA branches are within normal limits. Symmetric flow signal in the bilateral MCA M1 segments and at the bifurcations. Bilateral MCA branch detail is degraded by motion. But right MCA branch patency appears improved from the earlier CTA. IMPRESSION: Motion degraded exam with improved right MCA patency from the pre-treatment CTA. Electronically Signed   By: Genevie Ann M.D.   On: 05/01/2019 03:43   Mr Brain Wo Contrast  Result Date: 05/01/2019 CLINICAL DATA:  63 year old male code stroke presentation with right MCA bifurcation occlusion treated with endovascular reperfusion. EXAM: MRI HEAD WITHOUT CONTRAST TECHNIQUE: Multiplanar, multiecho pulse sequences of the brain and surrounding structures were obtained without intravenous contrast. COMPARISON:  04/30/2019 CT head, CTA and CT Perfusion. FINDINGS: The examination had to be discontinued prior to completion due to patient agitation, and is intermittently motion degraded. Coronal T2 and axial T1 weighted imaging was not obtained. Brain: Restricted diffusion at the right frontal operculum and involving a portion of the right  insula, and some of the deep white matter capsule. This encompasses an area of about 31 x 36 x 60 millimeters (approximately 33 milliliters). FLAIR more so than T2 hyperintensity in the affected parenchyma. No associated hemorrhage is evident on motion degraded SWI. No mass effect. Possible small area of restricted diffusion also in the right inferior frontal gyrus on series 9, image 69. No restricted diffusion in any other vascular territory. Chronic anterior left MCA and right temporal lobe encephalomalacia. Mild ex vacuo ventricular enlargement. Negative brainstem and cerebellum. No midline shift, mass effect, evidence of mass lesion, extra-axial collection. Cervicomedullary junction and pituitary are within normal limits. Vascular: Major intracranial vascular flow voids are preserved. Skull and upper cervical spine: Negative visible cervical spine. Normal bone marrow signal. Sinuses/Orbits: Mild rightward gaze deviation. Otherwise negative orbits. Partially visible paranasal sinus mucosal thickening. Other: Mastoids are clear. IMPRESSION: 1. Truncated and intermittently motion degraded exam due to patient agitation. 2. Right MCA territory restricted diffusion primarily involving the operculum. No associated hemorrhage or mass effect. 3. Underlying bilateral MCA territory chronic encephalomalacia. Electronically Signed   By: Genevie Ann M.D.   On: 05/01/2019 03:39   [place]  Result Date: 05/12/2019 INDICATION: Acute onset of left-sided weakness, right gaze deviation. 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 EXAM: 1. EMERGENT LARGE VESSEL OCCLUSION THROMBOLYSIS (anterior CIRCULATION) COMPARISON:  CT angiogram of the head and neck of April 30, 2019. MEDICATIONS: Vancomycin IV antibiotic was administered within 1 hour of the procedure ANESTHESIA/SEDATION: General anesthesia. CONTRAST:  Isovue 300 approximately 60 mL. FLUOROSCOPY TIME:  Fluoroscopy Time: 20  minutes 30 seconds (1462 mGy). COMPLICATIONS: None immediate. TECHNIQUE: An emergent 2 physician consent was obtained. No family members or next of kin were available physically or by phone. Patient was unable to provide consent on account of his medical condition. The patient was then put under general anesthesia by the Department of Anesthesiology at [facility] The right groin was prepped and draped in the usual sterile  fashion. Thereafter using modified Seldinger technique, transfemoral access into the right common femoral artery was obtained without difficulty. Over a 0.035 inch guidewire a 5 French Pinnacle sheath was inserted. Through this, and also over a 0.035 inch guidewire a 5 [place] JB 1 catheter was advanced to the aortic arch region and selectively positioned in the innominate artery, and the left common carotid artery. FINDINGS: The innominate artery angiogram demonstrates wide patency of the right subclavian artery and the right common carotid artery proximally. The right vertebral artery at its origin and distally appears widely patent to the cranial skull base. The right common carotid arteriogram demonstrates the right external carotid artery and its major branches to be widely patent. The right internal carotid artery at the bulb has a smooth shallow plaque along its posterior superior aspect without evidence of intraluminal filling defects or of ulcerations. The vessel is, otherwise, seen to opacify to the cranial skull base. The petrous, cavernous and supraclinoid segments are widely patent. The right posterior communicating artery is seen opacifying the right posterior cerebral artery distribution. The right anterior cerebral artery opacifies into the capillary and venous phases. Complete angiographic occlusion is seen of the right middle cerebral artery mid M1 segment. The delayed arterial run demonstrates partial retrograde opacification of the right middle cerebral artery anterior  perisylvian branches from collaterals arising from the pericallosal and the callosal marginal branches. PROCEDURE: The diagnostic JB 1 catheter in the right common carotid artery was exchanged over a 0.035 inch 300 cm Rosen exchange guidewire for an 8 [place] Pinnacle sheath which was connected to continuous heparinized saline infusion. Over the [facility] exchange guidewire, an 087 balloon guide catheter which had been prepped with 50% contrast and 50% heparinized saline infusion was then advanced to the mid right ICA without difficulty. The guidewire was removed. Good aspiration obtained from the hub of the balloon guide catheter. A control arteriogram performed through the balloon guide catheter demonstrated no change in the intracranial circulation. Over a 0.014 inch standard Synchro micro guidewire which had a J configuration at its tip, an 021 Trevo ProVue microcatheter inside of a 6 French 132 cm Catalyst guide catheter was advanced to the supraclinoid right ICA without difficulty. The micro guidewire was then gently advanced using a torque device through the occluded right middle cerebral artery into the M2 M3 inferior division. The guidewire was removed. Good aspiration obtained from the microcatheter which had been advanced into the M2 M3 region. Gentle contrast injection demonstrated safe position of tip of the microcatheter. This was then connected to continuous heparinized saline infusion. A 4 mm x 40 mm Solitaire X retrieval device was then advanced to the distal end of the microcatheter. The O ring on the delivery microcatheter was then loosened. With slight forward gentle traction with the right hand on the delivery micro guidewire, with the left hand the microcatheter was retrieved unsheathing distally and then the proximal portion of the retrieval device. The 6 [place] Catalyst guide catheter was then advanced to the occluded proximal portion of the middle cerebral artery. With proximal flow arrest in the  right internal carotid artery, and aspiration through the Penumbra aspirate catheter at the hub of the 6 [place] Catalyst guide catheter and the proximal portion of the occluded right middle cerebral artery over 2 minutes, the combination of the retrieval device, the microcatheter and the 6 [place] Catalyst guide catheter were retrieved and removed. Following flow arrest reversal, a control arteriogram performed through the balloon guide catheter in the right  internal carotid artery demonstrated now complete angiographic revascularization of the right middle cerebral artery and the bifurcation branches. There continued to be hypoperfusion in the anterior 1/3 of the perisylvian triangle. However, delayed arterial injection revealed retrograde opacification to this segment of the middle cerebral artery probably representing partially occluded superior division of the right middle cerebral artery on a chronic basis as per the old infarcts seen in this distribution on the CT scan. Spasm noted in the inferior division of the right middle cerebral artery was treated successfully with 3 aliquots of 25 mcg of nitroglycerin with relief. A final control arteriogram performed through the right common carotid artery demonstrated unimpeded flow through the right internal carotid artery cranial skull base and the right middle and the right anterior cerebral arteries. The right posterior communicating artery remained patent. No change was seen in the revascularized right middle cerebral artery achieving a TICI 2c revascularization. The balloon guide catheter was removed. The 8 French Pinnacle sheath in the right groin was then removed with hemostasis achieved with an 8 [place] Angio-Seal closure device. Distal pulses remained Dopplerable in the dorsalis pedis, and the posterior tibial regions bilaterally. CT scan of the brain obtained demonstrated no evidence of mass effect, midline shift or intracranial hemorrhage. The patient was  then extubated without difficulty. Upon partial recovery, the patient moved his right arm and leg and the distal left foot spontaneously. He was then returned to the neuro ICU for post thrombectomy management. IMPRESSION: Status post endovascular complete revascularization of the right middle cerebral M1 segment with 1 pass using the 4 mm x 40 mm Solitaire X retrieval device and Penumbra proximal aspiration of the clot achieving a TICI 2c revascularization. PLAN: As per referring MD. Electronically Signed   By: Luanne Bras M.D.   On: 05/12/2019 13:46   Ct Code Stroke Cerebral Perfusion With Contrast  Result Date: 04/30/2019 CLINICAL DATA:  Stroke. Left-sided weakness. Atrial fibrillation off anticoagulation. EXAM: CT ANGIOGRAPHY HEAD AND NECK CT PERFUSION BRAIN TECHNIQUE: Multidetector CT imaging of the head and neck was performed using the standard protocol during bolus administration of intravenous contrast. Multiplanar CT image reconstructions and MIPs were obtained to evaluate the vascular anatomy. Carotid stenosis measurements (when applicable) are obtained utilizing NASCET criteria, using the distal internal carotid diameter as the denominator. Multiphase CT imaging of the brain was performed following IV bolus contrast injection. Subsequent parametric perfusion maps were calculated using RAPID software. CONTRAST:  166mL OMNIPAQUE IOHEXOL 350 MG/ML SOLN COMPARISON:  CT head 04/30/2019 FINDINGS: CTA NECK FINDINGS Aortic arch: Suboptimal arterial opacification. Contrast is in the venous phase with limited arterial contrast. Atherosclerotic disease in the aorta. Proximal great vessels patent. Image interpretation delayed as patient was registered under the wrong name. Right carotid system: Mild atherosclerotic disease right carotid bifurcation without significant stenosis Left carotid system: Mild atherosclerotic disease left bifurcation without significant stenosis. Vertebral arteries: Both vertebral  arteries are patent. Limited contrast opacification. Skeleton: No acute abnormality. Other neck: Negative for mass or adenopathy. Upper chest: Negative Review of the MIP images confirms the above findings CTA HEAD FINDINGS Anterior circulation: Mild atherosclerotic disease in the cavernous carotid bilaterally. Right MCA occlusion at the bifurcation. Chronic infarct right temporal lobe. Both anterior cerebral arteries patent. Left middle cerebral artery widely patent. Posterior circulation: Vertebral arteries not well visualized. Basilar artery not well visualized due to lack of adequate contrast. Fetal origin of the posterior cerebral arteries bilaterally Venous sinuses: No venous contrast present. Anatomic variants: None Review of the MIP  images confirms the above findings CT Brain Perfusion Findings: ASPECTS: 10 CBF (<30%) Volume: 57mL Perfusion (Tmax>6.0s) volume: 236mL Mismatch Volume: 02/01/2019mL Infarction Location:Right MCA territory involving the right lateral temporal lobe and parietal lobe. IMPRESSION: Suboptimal CTA. Delayed interpretation due to registration under the wrong name. Very early arterial phase study with limited arterial opacification in the CTA. As best I can tell both carotid arteries are patent in the neck. There is occlusion of the right MCA at the bifurcation. CT perfusion markedly abnormal with large area of delayed perfusion right MCA territory. 219 mL of delayed perfusion. No core infarct. These results were called by telephone at the time of interpretation on 04/30/2019 at 12:30 pm to provider Aroor, who verbally acknowledged these results. Electronically Signed   By: Franchot Gallo M.D.   On: 04/30/2019 12:33   Dg Chest Port 1 View  Result Date: 05/13/2019 CLINICAL DATA:  Cardiomegaly EXAM: PORTABLE CHEST 1 VIEW COMPARISON:  April 30, 2019 FINDINGS: Cardiomegaly is stable. The pulmonary vascular is normal. There is aortic atherosclerosis. There is no evident edema or  consolidation. No adenopathy. Feeding tube tip is below the diaphragm. No bone lesions appreciable. IMPRESSION: Stable cardiomegaly. No edema or consolidation. Feeding tube extends below the diaphragm. Aortic Atherosclerosis (ICD10-I70.0). Electronically Signed   By: Lowella Grip III M.D.   On: 05/13/2019 08:29   Dg Chest Port 1 View  Result Date: 04/30/2019 CLINICAL DATA:  Stroke EXAM: PORTABLE CHEST 1 VIEW COMPARISON:  08/25/2015 FINDINGS: Cardiomegaly. Both lungs are clear. The visualized skeletal structures are unremarkable. IMPRESSION: Cardiomegaly without acute abnormality of the lungs in AP portable projection. Electronically Signed   By: Eddie Candle M.D.   On: 04/30/2019 16:07   Xr Knee R  Result Date: 04/21/2019 CLINICAL DATA:  Fall EXAM: RIGHT KNEE - COMPLETE 4+ VIEW COMPARISON:  None. FINDINGS: No fracture or dislocation of the right knee. Moderate tricompartmental joint space narrowing and osteophytosis. Moderate, nonspecific knee joint effusion. Soft tissues are unremarkable. IMPRESSION: No fracture or dislocation of the right knee. Moderate tricompartmental joint space narrowing and osteophytosis. Moderate, nonspecific knee joint effusion. Electronically Signed   By: Eddie Candle M.D.   On: 04/21/2019 19:11   Dg Abd Portable 1v  Result Date: 05/08/2019 CLINICAL DATA:  Nasogastric tube placement EXAM: PORTABLE ABDOMEN - 1 VIEW COMPARISON:  May 08, 2019 study obtained earlier in the day FINDINGS: Nasogastric tube tip and side port are present in the proximal body of the stomach. There are surgical clips at the gastroesophageal junction. There is no bowel dilatation or air-fluid level to suggest bowel obstruction. No free air. IMPRESSION: Nasogastric tube tip and side port in proximal body of stomach. Surgical clips at gastroesophageal junction. No bowel obstruction or free air demonstrable. Electronically Signed   By: Lowella Grip III M.D.   On: 05/08/2019 11:46   Dg Abd  Portable 1v  Result Date: 05/08/2019 CLINICAL DATA:  63 year old male status post NG placement. EXAM: PORTABLE ABDOMEN - 1 VIEW COMPARISON:  None. FINDINGS: An enteric tube is partially visualized with tip and side-port in the region of the GE junction. The tube appears to be bent on itself distal to the side port. Evaluation and repositioning. Dilated small bowel noted in the lower abdomen measuring up to 4.7 cm. The osseous structures and soft tissues are unremarkable. IMPRESSION: 1. Enteric tube bent on itself with tip and side-port in the region of the GE junction. Evaluation and repositioning is recommended. 2. Dilated small bowel in the lower  abdomen measuring up to 4.7 cm. Electronically Signed   By: Anner Crete M.D.   On: 05/08/2019 11:06   Dg Addison Bailey G Tube Plc W/fl-no Rad  Result Date: 05/02/2019 CLINICAL DATA:  NG tube placement, history of gastric bypass EXAM: ABDOMEN - 1 VIEW; DG NASO G TUBE PLC W/FL-NO RAD CONTRAST:  76mL OMNIPAQUE IOHEXOL 300 MG/ML  SOLN FLUOROSCOPY TIME:  Fluoroscopy Time:  5 minutes 12 seconds Number of Acquired Spot Images: 1 COMPARISON:  None. FINDINGS: Enteric tube is not well seen but injected contrast opacifies the lumen of the stomach. IMPRESSION: Enteric tube placement within the stomach. Electronically Signed   By: Macy Mis M.D.   On: 05/02/2019 15:31   Xr Foot R  Result Date: 04/21/2019 CLINICAL DATA:  Fall, pain EXAM: RIGHT FOOT COMPLETE - 3+ VIEW; RIGHT ANKLE - COMPLETE 3+ VIEW COMPARISON:  11/24/2013 FINDINGS: No fracture or dislocation of the right foot or or ankle. Redemonstrated postoperative findings of partial right fifth metatarsal resection. Mild osteoarthritic pattern arthrosis throughout. Soft tissues are unremarkable. IMPRESSION: No fracture or dislocation of the right foot or or ankle. Electronically Signed   By: Eddie Candle M.D.   On: 04/21/2019 19:15   Ir Percutaneous Art Thrombectomy/infusion Intracranial Inc Diag Angio  Result  Date: 05/12/2019 INDICATION: Acute onset of left-sided weakness, right gaze deviation. 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 EXAM: 1. EMERGENT LARGE VESSEL OCCLUSION THROMBOLYSIS (anterior CIRCULATION) COMPARISON:  CT angiogram of the head and neck of April 30, 2019. MEDICATIONS: Vancomycin IV antibiotic was administered within 1 hour of the procedure ANESTHESIA/SEDATION: General anesthesia. CONTRAST:  Isovue 300 approximately 60 mL. FLUOROSCOPY TIME:  Fluoroscopy Time: 20 minutes 30 seconds (1462 mGy). COMPLICATIONS: None immediate. TECHNIQUE: An emergent 2 physician consent was obtained. No family members or next of kin were available physically or by phone. Patient was unable to provide consent on account of his medical condition. The patient was then put under general anesthesia by the Department of Anesthesiology at [facility]. The right groin was prepped and draped in the usual sterile fashion. Thereafter using modified Seldinger technique, transfemoral access into the right common femoral artery was obtained without difficulty. Over a 0.035 inch guidewire a 5 French Pinnacle sheath was inserted. Through this, and also over a 0.035 inch guidewire a 5 [place] JB 1 catheter was advanced to the aortic arch region and selectively positioned in the innominate artery, and the left common carotid artery. FINDINGS: The innominate artery angiogram demonstrates wide patency of the right subclavian artery and the right common carotid artery proximally. The right vertebral artery at its origin and distally appears widely patent to the cranial skull base. The right common carotid arteriogram demonstrates the right external carotid artery and its major branches to be widely patent. The right internal carotid artery at the bulb has a smooth shallow plaque along its posterior superior aspect without evidence of intraluminal filling defects or of ulcerations. The  vessel is, otherwise, seen to opacify to the cranial skull base. The petrous, cavernous and supraclinoid segments are widely patent. The right posterior communicating artery is seen opacifying the right posterior cerebral artery distribution. The right anterior cerebral artery opacifies into the capillary and venous phases. Complete angiographic occlusion is seen of the right middle cerebral artery mid M1 segment. The delayed arterial run demonstrates partial retrograde opacification of the right middle cerebral artery anterior perisylvian branches from collaterals arising from the pericallosal and the callosal  marginal branches. PROCEDURE: The diagnostic JB 1 catheter in the right common carotid artery was exchanged over a 0.035 inch 300 cm Rosen exchange guidewire for an 8 [place] Pinnacle sheath which was connected to continuous heparinized saline infusion. Over the [facility] exchange guidewire, an 087 balloon guide catheter which had been prepped with 50% contrast and 50% heparinized saline infusion was then advanced to the mid right ICA without difficulty. The guidewire was removed. Good aspiration obtained from the hub of the balloon guide catheter. A control arteriogram performed through the balloon guide catheter demonstrated no change in the intracranial circulation. Over a 0.014 inch standard Synchro micro guidewire which had a J configuration at its tip, an 021 Trevo ProVue microcatheter inside of a 6 French 132 cm Catalyst guide catheter was advanced to the supraclinoid right ICA without difficulty. The micro guidewire was then gently advanced using a torque device through the occluded right middle cerebral artery into the M2 M3 inferior division. The guidewire was removed. Good aspiration obtained from the microcatheter which had been advanced into the M2 M3 region. Gentle contrast injection demonstrated safe position of tip of the microcatheter. This was then connected to continuous heparinized saline  infusion. A 4 mm x 40 mm Solitaire X retrieval device was then advanced to the distal end of the microcatheter. The O ring on the delivery microcatheter was then loosened. With slight forward gentle traction with the right hand on the delivery micro guidewire, with the left hand the microcatheter was retrieved unsheathing distally and then the proximal portion of the retrieval device. The 6 [place] Catalyst guide catheter was then advanced to the occluded proximal portion of the middle cerebral artery. With proximal flow arrest in the right internal carotid artery, and aspiration through the Penumbra aspirate catheter at the hub of the 6 [place] Catalyst guide catheter and the proximal portion of the occluded right middle cerebral artery over 2 minutes, the combination of the retrieval device, the microcatheter and the 6 [place] Catalyst guide catheter were retrieved and removed. Following flow arrest reversal, a control arteriogram performed through the balloon guide catheter in the right internal carotid artery demonstrated now complete angiographic revascularization of the right middle cerebral artery and the bifurcation branches. There continued to be hypoperfusion in the anterior 1/3 of the perisylvian triangle. However, delayed arterial injection revealed retrograde opacification to this segment of the middle cerebral artery probably representing partially occluded superior division of the right middle cerebral artery on a chronic basis as per the old infarcts seen in this distribution on the CT scan. Spasm noted in the inferior division of the right middle cerebral artery was treated successfully with 3 aliquots of 25 mcg of nitroglycerin with relief. A final control arteriogram performed through the right common carotid artery demonstrated unimpeded flow through the right internal carotid artery cranial skull base and the right middle and the right anterior cerebral arteries. The right posterior communicating  artery remained patent. No change was seen in the revascularized right middle cerebral artery achieving a TICI 2c revascularization. The balloon guide catheter was removed. The 8 [place] Pinnacle sheath in the right groin was then removed with hemostasis achieved with an 8 [place] Angio-Seal closure device. Distal pulses remained Dopplerable in the dorsalis pedis, and the posterior tibial regions bilaterally. CT scan of the brain obtained demonstrated no evidence of mass effect, midline shift or intracranial hemorrhage. The patient was then extubated without difficulty. Upon partial recovery, the patient moved his right arm and leg and the  distal left foot spontaneously. He was then returned to the neuro ICU for post thrombectomy management. IMPRESSION: Status post endovascular complete revascularization of the right middle cerebral M1 segment with 1 pass using the 4 mm x 40 mm Solitaire X retrieval device and Penumbra proximal aspiration of the clot achieving a TICI 2c revascularization. PLAN: As per referring MD. Electronically Signed   By: Luanne Bras M.D.   On: 05/12/2019 13:46   Xr Hip Right  Result Date: 04/21/2019 CLINICAL DATA:  Fall, pain EXAM: DG HIP (WITH OR WITHOUT PELVIS) 2-3V RIGHT COMPARISON:  None. FINDINGS: No fracture or dislocation of the right hip. Mild superior joint space narrowing and osteophytosis. The pelvis and proximal left femur are unremarkable in frontal view only. Nonobstructive pattern of overlying bowel gas. IMPRESSION: No fracture or dislocation of the right hip. Mild superior joint space narrowing and osteophytosis. Please note that plain radiographs are insensitive for hip and pelvic fracture. Electronically Signed   By: Eddie Candle M.D.   On: 04/21/2019 19:12   Dg Femur Min 2 Views Right  Result Date: 04/21/2019 CLINICAL DATA:  Persistent pain after fall EXAM: RIGHT FEMUR 2 VIEWS COMPARISON:  04/21/2019 FINDINGS: Moderate arthritis right hip. No definitive displaced  fracture. Distal femur evaluation limited by technical artifact. Lucency at the posterior cortex of the femur shaft favor nutrient foramen. IMPRESSION: 1. Limited evaluation of distal femur lateral view secondary to technique. An oblique lucency at the posterior cortex of the shaft of the femur is to represent nutrient foramen over fracture. Electronically Signed   By: Donavan Foil M.D.   On: 04/21/2019 20:21   Ct Head Code Stroke Wo Contrast  Result Date: 04/30/2019 CLINICAL DATA:  Code stroke.  Left-sided weakness. EXAM: CT HEAD WITHOUT CONTRAST TECHNIQUE: Contiguous axial images were obtained from the base of the skull through the vertex without intravenous contrast. COMPARISON:  None. FINDINGS: Brain: Chronic infarct right lateral temporal lobe. Chronic infarct left frontal lobe. Chronic infarct anterior limb internal capsule on the left. Negative for acute infarct. Negative for hemorrhage or mass. Mild ventricular enlargement most likely due to mild atrophy. Vascular: Hyperdense right MCA at the bifurcation. Probable acute thrombus. Skull: Negative Sinuses/Orbits: Cyst left maxillary sinus otherwise sinuses clear. Negative orbit. Other: None ASPECTS ([place] Stroke Program Early CT Score) - Ganglionic level infarction (caudate, lentiform nuclei, internal capsule, insula, M1-M3 cortex): 7 - Supraganglionic infarction (M4-M6 cortex): 3 Total score (0-10 with 10 being normal): 10 IMPRESSION: 1. Probable hyperdense right MCA bifurcation.  No acute infarct. 2. Atrophy with chronic right temporal infarct and chronic left frontal infarct. 3. ASPECTS is 10 4. Results texted to Dr. Lorraine Lax Electronically Signed   By: Franchot Gallo M.D.   On: 04/30/2019 11:54   Ct Head Code Stroke Wo Contrast  Result Date: 04/30/2019 CLINICAL DATA:  Code stroke.  Left-sided weakness. EXAM: CT HEAD WITHOUT CONTRAST TECHNIQUE: Contiguous axial images were obtained from the base of the skull through the vertex without intravenous  contrast. COMPARISON:  None. FINDINGS: Brain: Chronic infarct right lateral temporal lobe. Chronic infarct left frontal lobe. Chronic infarct anterior limb internal capsule on the left. Negative for acute infarct. Negative for hemorrhage or mass. Mild ventricular enlargement most likely due to mild atrophy. Vascular: Hyperdense right MCA at the bifurcation. Probable acute thrombus. Skull: Negative Sinuses/Orbits: Cyst left maxillary sinus otherwise sinuses clear. Negative orbit. Other: None ASPECTS ([place] Stroke Program Early CT Score) - Ganglionic level infarction (caudate, lentiform nuclei, internal capsule, insula, M1-M3 cortex): 7 -  Supraganglionic infarction (M4-M6 cortex): 3 Total score (0-10 with 10 being normal): 10 IMPRESSION: 1. Probable hyperdense right MCA bifurcation.  No acute infarct. 2. Atrophy with chronic right temporal infarct and chronic left frontal infarct. 3. ASPECTS is 10 4. Results texted to Dr. Lorraine Lax Electronically Signed   By: Franchot Gallo M.D.   On: 04/30/2019 11:54   Ir Angio Vertebral Sel Subclavian Innominate Uni R Mod Sed  Result Date: 05/12/2019 INDICATION: Acute onset of left-sided weakness, right gaze deviation. 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  EXAM: 1. EMERGENT LARGE VESSEL OCCLUSION THROMBOLYSIS (anterior CIRCULATION)  COMPARISON:  CT angiogram of the head and neck of April 30, 2019.  MEDICATIONS: Vancomycin IV antibiotic was administered within 1 hour of the procedure  ANESTHESIA/SEDATION: General anesthesia.  CONTRAST:  Isovue 300 approximately 60 mL.  FLUOROSCOPY TIME:  Fluoroscopy Time: 20 minutes 30 seconds (1462 mGy).  COMPLICATIONS: None immediate.  TECHNIQUE: An emergent 2 physician consent was obtained. No family members or next of kin were available physically or by phone. Patient was unable to provide consent on account of his medical condition.  The patient was then put under general  anesthesia by the Department of Anesthesiology at [facility].  The right groin was prepped and draped in the usual sterile fashion. Thereafter using modified Seldinger technique, transfemoral access into the right common femoral artery was obtained without difficulty. Over a 0.035 inch guidewire a 5 French Pinnacle sheath was inserted. Through this, and also over a 0.035 inch guidewire a 5 [place] JB 1 catheter was advanced to the aortic arch region and selectively positioned in the innominate artery, and the left common carotid artery.  FINDINGS: The innominate artery angiogram demonstrates wide patency of the right subclavian artery and the right common carotid artery proximally.  The right vertebral artery at its origin and distally appears widely patent to the cranial skull base.  The right common carotid arteriogram demonstrates the right external carotid artery and its major branches to be widely patent.  The right internal carotid artery at the bulb has a smooth shallow plaque along its posterior superior aspect without evidence of intraluminal filling defects or of ulcerations.  The vessel is, otherwise, seen to opacify to the cranial skull base. The petrous, cavernous and supraclinoid segments are widely patent.  The right posterior communicating artery is seen opacifying the right posterior cerebral artery distribution.  The right anterior cerebral artery opacifies into the capillary and venous phases.  Complete angiographic occlusion is seen of the right middle cerebral artery mid M1 segment.  The delayed arterial run demonstrates partial retrograde opacification of the right middle cerebral artery anterior perisylvian branches from collaterals arising from the pericallosal and the callosal marginal branches.  PROCEDURE: The diagnostic JB 1 catheter in the right common carotid artery was exchanged over a 0.035 inch 300 cm Rosen exchange guidewire for an 8 [place] Pinnacle sheath which  was connected to continuous heparinized saline infusion.  Over the [facility] exchange guidewire, an 087 balloon guide catheter which had been prepped with 50% contrast and 50% heparinized saline infusion was then advanced to the mid right ICA without difficulty. The guidewire was removed. Good aspiration obtained from the hub of the balloon guide catheter. A control arteriogram performed through the balloon guide catheter demonstrated no change in the intracranial circulation.  Over a 0.014 inch standard Synchro micro guidewire which had a J configuration at its tip, an [employer]  microcatheter inside of a 6 French 132 cm Catalyst guide catheter was advanced to the supraclinoid right ICA without difficulty. The micro guidewire was then gently advanced using a torque device through the occluded right middle cerebral artery into the M2 M3 inferior division.  The guidewire was removed. Good aspiration obtained from the microcatheter which had been advanced into the M2 M3 region. Gentle contrast injection demonstrated safe position of tip of the microcatheter.  This was then connected to continuous heparinized saline infusion.  A 4 mm x 40 mm Solitaire X retrieval device was then advanced to the distal end of the microcatheter. The O ring on the delivery microcatheter was then loosened. With slight forward gentle traction with the right hand on the delivery micro guidewire, with the left hand the microcatheter was retrieved unsheathing distally and then the proximal portion of the retrieval device.  The 6 [place] Catalyst guide catheter was then advanced to the occluded proximal portion of the middle cerebral artery.  With proximal flow arrest in the right internal carotid artery, and aspiration through the Penumbra aspirate catheter at the hub of the 6 [place] Catalyst guide catheter and the proximal portion of the occluded right middle cerebral artery over 2 minutes, the combination of the retrieval device, the  microcatheter and the 6 [place] Catalyst guide catheter were retrieved and removed. Following flow arrest reversal, a control arteriogram performed through the balloon guide catheter in the right internal carotid artery demonstrated now complete angiographic revascularization of the right middle cerebral artery and the bifurcation branches.  There continued to be hypoperfusion in the anterior 1/3 of the perisylvian triangle.  However, delayed arterial injection revealed retrograde opacification to this segment of the middle cerebral artery probably representing partially occluded superior division of the right middle cerebral artery on a chronic basis as per the old infarcts seen in this distribution on the CT scan. Spasm noted in the inferior division of the right middle cerebral artery was treated successfully with 3 aliquots of 25 mcg of nitroglycerin with relief.  A final control arteriogram performed through the right common carotid artery demonstrated unimpeded flow through the right internal carotid artery cranial skull base and the right middle and the right anterior cerebral arteries. The right posterior communicating artery remained patent.  No change was seen in the revascularized right middle cerebral artery achieving a TICI 2c revascularization.  The balloon guide catheter was removed. The 8 French Pinnacle sheath in the right groin was then removed with hemostasis achieved with an 8 [place] Angio-Seal closure device. Distal pulses remained Dopplerable in the dorsalis pedis, and the posterior tibial regions bilaterally.  CT scan of the brain obtained demonstrated no evidence of mass effect, midline shift or intracranial hemorrhage.  The patient was then extubated without difficulty. Upon partial recovery, the patient moved his right arm and leg and the distal left foot spontaneously.  He was then returned to the neuro ICU for post thrombectomy management.  IMPRESSION: Status post endovascular  complete revascularization of the right middle cerebral M1 segment with 1 pass using the 4 mm x 40 mm Solitaire X retrieval device and Penumbra proximal aspiration of the clot achieving a TICI 2c revascularization.  PLAN: As per referring MD.   Electronically Signed   By: Luanne Bras M.D.   On: 05/12/2019 13:46  Transthoracic Echocardiogram  04/30/2019  1. Left ventricular ejection fraction, by visual estimation, is 60 to 65%. The left ventricle has normal function. There is moderately increased left ventricular hypertrophy.  2. Left ventricular diastolic function could not be evaluated.  3. Global right ventricle has normal systolic function.The right ventricular size is normal. No increase in right ventricular wall thickness.  4. Left atrial size was severely dilated.  5. Right atrial size was mildly dilated.  6. Mild to moderate mitral annular calcification.  7. The mitral valve is grossly normal. No evidence of mitral valve regurgitation.  8. The tricuspid valve is grossly normal. Tricuspid valve regurgitation is trivial.  9. The aortic valve is tricuspid. Aortic valve regurgitation is mild. Mild aortic valve sclerosis without stenosis. 10. The pulmonic valve was not well visualized. Pulmonic valve regurgitation is not visualized.  HISTORY OF PRESENT ILLNESS (From Dr Aroor's H&P on 04/30/2019) KREW AMADIO is an 64 y.o. male  With PMH a. Fib (not anticoagulated), sick sinus syndrom, HTN, CVA, OSA (not on CPAP) who presented to [facility] as a code stroke. LKW was reported by EMS as 0600 but upon investigation Per friend [name] (via telephone) patient was not seen at 0600. At [place] wife came home and it was dark. She said that she did not see him sitting in the car, but it was dark. At about 1000 am Brady was found in his car confused and EMS was called. Not sure when he actually got into the car. (LKW 05/09/2019 time unknown). Patient smokes 1/2 pack of cigarettes per  day, drinks ETOH. mRS 1. Patient is prescribed xarelto, but no xarelto was found with his medications per med list given by [name].  BP: 150/90 BG:105 CTH: no hemorrhage. CTA: right MCA occlusion CTP: core 0. Penumbra: 219 Per family medicine note 09-15-2018 by Alma Friendly. Patient was recommended to re-start anticoagulation for his a. Fib. xarelto was started. Per note from [place] on 04/23/2019 patient was not taking xarelto.  tPA was not given as he was outside of the window  HOSPITAL COURSE Mr. SYLVESTRE DROTAR is a 63 y.o. male with history of a. Fib ( not anticoagulated) sick sinus syndrom, HTN, CVA, OSA ( not on CPAP) presenting with right side gaze and left arm paralysis.   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 Probably hyperdense R MCA bifurcation. Old R temporal lobe and L frontal lobe infarcts. .     CTA head & neck suboptimal. Occlusion R MCA at bifurcation.  CT perfusion large delayed perfusion R MCA. No core infarct.   Cerebral angio occluded R M1, TICI2c   MRI  R MCA infarct (operculum). B MCA encephalomalacia  MRA  Improved R MCA patency  2D Echo EF 60-65%. No source of embolus   LDL 57   HgbA1c 5.5   HIV neg  aspirin 81 mg daily prior to admission, off IV heparin d/t bleeding from penis, but resumed 11/26. Back on heparin post J tube placement - as no crushed pills, switch heparin IV to lovenox bid    Therapy recommendations:  CIR->family prefer SNF  Disposition:  SNF - CSW working on new facility for possible discharge today, insurance pending   Repeat COVID Sunday negative  Atrial Fibrillation / SSS  Home anticoagulation:  not taking   Started on Xarelto 09/15/2018, documented not taking 04/23/2019 (family practice note)  off IV heparin d/t bleeding from penis, resumed 11/26. Back on heparin post J tube  placement - as no crushed pills, switched heparin  IV to lovenox bid    Bradycardia with pauses (2 seconds) - Dr Rory Percy saw 11/20 - pt asymptomatic - currently rate in 70's - decreased metoprolol dose - rate  .   Hypertension  Home meds:  Lisinopril 20  Treated w/ cleviprex, now off. Labetalol prn   BP goal < 180  BP stable now     On metoprolol 12.5 bid     Long-term BP goal normotensive  Hyperlipidemia  Home meds:  Prescribed lipitor - ? taking  LDL 57, goal < 70  Was on lipitor 40 -> off lipitor due to no liquid form of statin available for tube administration and pt NPO  Dysphagia  Secondary to stroke  NPO  Speech on board  J tube placed by surgery 12/1 (with lysis of adhesions)  Trickle TF started 12/2 and tolerating now at goal of 75  Continue free water 100 Q6h  No crushed meds should be given via J tube - confirmed with surgery that meds cannot be given via J tube - meds will need to be oral solutions per tube.  Bleeding around j-tube site 12/7, resolved 12/8. asked surgery team to evaluate prior to d/c  Chronic pain syndrome  chronic B LBP w/ R sided sciatica  Pain from recent fall on narcotics at home.   Followed at [facility].  Also complains of J tube surgery incision site pain  Change oxycodone 10 mg every 4 hours as needed to 7.5 mg every 3 hours as needed  Tobacco abuse  Current smoker  Smoking cessation counseling provided  Pt is willing to quit   Other Stroke Risk Factors  ETOH use, alcohol level <10, advised to drink no more than 2 drink(s) a day  Substance abuse - UDS positive for Amphetamines   Morbid Obesity, Body mass index is 41.61 kg/m., hx failed open gastroplasty in 1993, recommend weight loss, diet and exercise as appropriate   Hx stroke/TIA by imaging - Old R temporal lobe and L frontal lobe infarcts - likely due to AFib  Obstructive sleep apnea, not on CPAP at  home  PVD   Other Active Problems  Leukocytosis, resolved.   COPD  Hypokalemia, resolved  Adjustment d/o w/ mixed anxiety and depressed mood  LBP on oxycodone PRN  Pt reported blood from penis, had earlier in hospital stay which resolved. 12/7:  UA w/ > 50 RBC, >50 WBC but no nitrites and few bacteria. U Cx < 10k (insignificant growth)   DISCHARGE EXAM Blood pressure 119/77, pulse 82, temperature 98.1 F (36.7 C), temperature source Oral, resp. rate 20, height 6\' 3"  (1.905 m), weight (!) 137.4 kg, SpO2 97 %.   General - morbidly obese, well developed, not in acute distress.  Ophthalmologic - fundi not visualized due to noncooperation.  Cardiovascular - irregularly irregular heart rate and rhythm.  Neuro - awake alert, still anarthric, not able to answer orientation questions but able to write them out on a note bad, he is fully oriented. Able to repeat "I am here", very dysarthric but not able to repeat other sentences. Follows all simple commands PERRL, EOMI, blinking to visual threat bilaterally. Bilateral facial weakness but left seems more than right. Tongue protrusion difficulty. RUE 5/5, LUE 4+/5 proximal and 4/5 finger grip. RLE 5/5. LLE 4+/5 proximal and 5-/5 distal. DTR 1+ and no babinski. Sensation symmetrical. Bilateral FTN intact. Gait not tested.   Discharge Diet   NPO, TF at 75/hr per J-tube  DISCHARGE PLAN  Disposition:  Skilled nursing facility for ongoing PT, OT and ST.   full dose lovenox for secondary stroke prevention.  Ongoing risk factor control by Primary Care Physician at time of discharge  NO CRUSHED MEDS VIA J-TUBE  Follow-up [name], MD or MD at [facility] in 2 weeks.  Follow-up in [facility] in 4 weeks, office to schedule an appointment.   Follow-up Ralene Ok, MD, general surgeon 06/22/2019 at 0900  Follow-up PM&R office in 3 months, order placed   42 minutes were spent preparing  discharge.  Burnetta Sabin, MSN, APRN, ANVP-BC, AGPCNP-BC Advanced Practice Stroke Nurse [place] for Schedule & Pager information 05/24/2019 10:40 AM  I have personally obtained history,examined this patient, reviewed notes, independently viewed imaging studies, participated in medical decision making and plan of care.ROS completed by me personally and pertinent positives fully documented  I have made any additions or clarifications directly to the above note. Agree with note above.    Antony Contras, MD Medical Director [facility] Pager: [phone] 05/24/2019 10:40 AM

## 2019-05-13 NOTE — Progress Notes (Signed)
ANTICOAGULATION CONSULT NOTE - Follow Up Consult  Pharmacy Consult for Heparin Indication: atrial fibrillation  Allergies  Allergen Reactions  . Vioxx [Rofecoxib] Rash    Patient Measurements: Height: 6\' 3"  (190.5 cm) Weight: (!) 339 lb 8.1 oz (154 kg) IBW/kg (Calculated) : 84.5 Heparin Dosing Weight:    Vital Signs: Temp: 98.2 F (36.8 C) (11/28 0300) Temp Source: Axillary (11/28 0300) BP: 140/90 (11/28 0300) Pulse Rate: 80 (11/28 0300)  Labs: Recent Labs    05/11/19 0334  05/12/19 0735 05/12/19 1614 05/13/19 0336  HGB 14.2  --  13.2  --  12.9*  HCT 43.9  --  40.4  --  39.1  PLT 245  --  220  --  191  HEPARINUNFRC  --    < > 0.19* 0.40 0.29*  CREATININE  --   --   --   --  0.52*   < > = values in this interval not displayed.    Estimated Creatinine Clearance: 154 mL/min (A) (by C-G formula based on SCr of 0.52 mg/dL (L)).   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 Hx of Afib with stroke likely d/t not being on anticoagulation. Hep had been on hold d/t penile bleeding now resolved and stroke team wants to resume, planned PEG sometime next week. Pharmacy consulted to dose.   Heparin level this morning is slightly SUBtherapeutic (HL 0.29 << 0.4, goal of 0.3-0.7).   Goal of Therapy:  Heparin level 0.3-0.5 Monitor platelets by anticoagulation protocol: Yes   Plan:  - Increase Heparin to 2350 units/hr (23.5 ml/hr) - Will continue to monitor for any signs/symptoms of bleeding and will follow up with heparin level in 6 hours   Thank you for allowing pharmacy to be a part of this patient's care.  Alycia Rossetti, PharmD, BCPS Clinical Pharmacist Clinical phone for 05/13/2019: [phone] 05/13/2019 7:29 AM   **Pharmacist phone directory can now be found on [email or web address] ([email or web address]).  Listed under [place].

## 2019-05-13 NOTE — Progress Notes (Signed)
ANTICOAGULATION CONSULT NOTE - Follow Up Consult  Pharmacy Consult for Heparin Indication: atrial fibrillation  Allergies  Allergen Reactions  . Vioxx [Rofecoxib] Rash    Patient Measurements: Height: 6\' 3"  (190.5 cm) Weight: (!) 339 lb 8.1 oz (154 kg) IBW/kg (Calculated) : 84.5 Heparin Dosing Weight:    Vital Signs: Temp: 98.8 F (37.1 C) (11/28 1159) Temp Source: Oral (11/28 1159) BP: 131/73 (11/28 1159) Pulse Rate: 92 (11/28 1159)  Labs: Recent Labs    05/11/19 0334  05/12/19 0735 05/12/19 1614 05/13/19 0336 05/13/19 1417  HGB 14.2  --  13.2  --  12.9*  --   HCT 43.9  --  40.4  --  39.1  --   PLT 245  --  220  --  191  --   HEPARINUNFRC  --    < > 0.19* 0.40 0.29* 0.44  CREATININE  --   --   --   --  0.52*  --    < > = values in this interval not displayed.    Estimated Creatinine Clearance: 154 mL/min (A) (by C-G formula based on SCr of 0.52 mg/dL (L)).   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 Hx of Afib with stroke likely d/t not being on anticoagulation. Hep had been on hold d/t penile bleeding now resolved and stroke team wants to resume, planned PEG sometime next week. Pharmacy consulted to dose.   Heparin level this morning is therapeutic at 0.44, on 2350 units/hr. Hgb 12.9, plt 191. No s/sx of bleeding or infusion issues.   Goal of Therapy:  Heparin level 0.3-0.5 Monitor platelets by anticoagulation protocol: Yes   Plan:  - Continue heparin infusion at 2350 units/hr (23.5 ml/hr) - Will continue to monitor for any signs/symptoms of bleeding - Check daily HL, CBC, and s/sx of bleeding   Thank you for allowing pharmacy to be a part of this patient's care.  [name], PharmD, BCCCP Clinical Pharmacist  Phone: [phone]  Please check AMION for all [place] phone numbers After 10:00 PM, call [place] [phone] 05/13/2019 2:58 PM   **Pharmacist phone directory can now be found on [email or web address] (PW  [email or web address]).  Listed under [place].

## 2019-05-14 LAB — BASIC METABOLIC PANEL
Anion gap: 10 (ref 5–15)
BUN: 12 mg/dL (ref 8–23)
CO2: 28 mmol/L (ref 22–32)
Calcium: 8.7 mg/dL — ABNORMAL LOW (ref 8.9–10.3)
Chloride: 100 mmol/L (ref 98–111)
Creatinine, Ser: 0.65 mg/dL (ref 0.61–1.24)
GFR calc Af Amer: >60 mL/min (ref >60)
GFR calc non Af Amer: >60 mL/min (ref >60)
Glucose, Bld: 96 mg/dL (ref 70–99)
Potassium: 3.8 mmol/L (ref 3.5–5.1)
Sodium: 138 mmol/L (ref 135–145)

## 2019-05-14 LAB — CBC
HCT: 42.7 % (ref 39.0–52.0)
Hemoglobin: 13.7 g/dL (ref 13.0–17.0)
MCH: 27.6 pg (ref 26.0–34.0)
MCHC: 32.1 g/dL (ref 30.0–36.0)
MCV: 86.1 fL (ref 80.0–100.0)
Platelets: 248 10*3/uL (ref 150–400)
RBC: 4.96 MIL/uL (ref 4.22–5.81)
RDW: 15.5 % (ref 11.5–15.5)
WBC: 6.4 10*3/uL (ref 4.0–10.5)
nRBC: 0 % (ref 0.0–0.2)

## 2019-05-14 LAB — GLUCOSE, CAPILLARY
Glucose-Capillary: 106 mg/dL — ABNORMAL HIGH (ref 70–99)
Glucose-Capillary: 108 mg/dL — ABNORMAL HIGH (ref 70–99)
Glucose-Capillary: 109 mg/dL — ABNORMAL HIGH (ref 70–99)
Glucose-Capillary: 122 mg/dL — ABNORMAL HIGH (ref 70–99)
Glucose-Capillary: 86 mg/dL (ref 70–99)
Glucose-Capillary: 86 mg/dL (ref 70–99)
Glucose-Capillary: 89 mg/dL (ref 70–99)

## 2019-05-14 LAB — HEPARIN LEVEL (UNFRACTIONATED): Heparin Unfractionated: 0.42 IU/mL (ref 0.30–0.70)

## 2019-05-14 MED ORDER — ZOLPIDEM TARTRATE 5 MG PO TABS
5.0000 mg | ORAL_TABLET | Freq: Every evening | ORAL | Status: DC | PRN
  Administered 2019-05-14: 22:00:00 5 mg via ORAL
  Filled 2019-05-14: qty 1

## 2019-05-14 MED ORDER — FREE WATER
100.0000 mL | Freq: Four times a day (QID) | Status: DC
  Administered 2019-05-14 – 2019-05-24 (×36): 100 mL

## 2019-05-14 MED ORDER — OXYCODONE HCL 5 MG PO TABS
10.0000 mg | ORAL_TABLET | Freq: Four times a day (QID) | ORAL | Status: DC | PRN
  Administered 2019-05-14 – 2019-05-16 (×7): 10 mg
  Filled 2019-05-14 (×7): qty 2

## 2019-05-14 NOTE — Progress Notes (Signed)
STROKE TEAM PROGRESS NOTE   INTERVAL HISTORY He is sitting in bed, no significant neuro change. Awake alert orientated, still has anarthria. Able to write to communicate. Complains of back pain, stated at home taking 10mg  of oxycodone PRN, will increased oxycodone to 10mg  PRN.    Vitals:   05/13/19 2359 05/14/19 0416 05/14/19 0750 05/14/19 1153  BP: 126/75 (!) 158/98 138/76 (!) 148/84  Pulse: 65 (!) 59 (!) 54 69  Resp: 19 20 20 20   Temp: 98.5 F (36.9 C) 98.3 F (36.8 C) 98.2 F (36.8 C) 98.8 F (37.1 C)  TempSrc: Oral Oral Oral Oral  SpO2: 95% 98% 97% 96%  Weight:      Height:        CBC:  Recent Labs  Lab 05/13/19 0336 05/14/19 0714  WBC 8.0 6.4  HGB 12.9* 13.7  HCT 39.1 42.7  MCV 84.4 86.1  PLT 191 [id number]   Basic Metabolic Panel:  Recent Labs  Lab 05/13/19 0336 05/14/19 0714  NA 135 138  K 3.8 3.8  CL 100 100  CO2 24 28  GLUCOSE 98 96  BUN 13 12  CREATININE 0.52* 0.65  CALCIUM 8.5* 8.7*   Lipid Panel:     Component Value Date/Time   CHOL 102 05/01/2019 0630   CHOL 142 12/19/2014 0804   TRIG 98 05/01/2019 0630   HDL 25 (L) 05/01/2019 0630   HDL 29 (L) 12/19/2014 0804   CHOLHDL 4.1 05/01/2019 0630   VLDL 20 05/01/2019 0630   LDLCALC 57 05/01/2019 0630   LDLCALC 93 12/19/2014 0804   HgbA1c:  Lab Results  Component Value Date   HGBA1C 5.5 05/01/2019   Urine Drug Screen:     Component Value Date/Time   LABOPIA NONE DETECTED 04/30/2019 1507   COCAINSCRNUR NONE DETECTED 04/30/2019 1507   COCAINSCRNUR Negative 03/15/2015 1141   LABBENZ NONE DETECTED 04/30/2019 1507   AMPHETMU POSITIVE (A) 04/30/2019 1507   THCU NONE DETECTED 04/30/2019 1507   LABBARB NONE DETECTED 04/30/2019 1507    Alcohol Level     Component Value Date/Time   ETH <10 04/30/2019 1152    IMAGING past 24h  Dg Chest Port 1 View  Result Date: 05/13/2019 CLINICAL DATA:  Cardiomegaly EXAM: PORTABLE CHEST 1 VIEW COMPARISON:  April 30, 2019 FINDINGS: Cardiomegaly is stable.  The pulmonary vascular is normal. There is aortic atherosclerosis. There is no evident edema or consolidation. No adenopathy. Feeding tube tip is below the diaphragm. No bone lesions appreciable. IMPRESSION: Stable cardiomegaly. No edema or consolidation. Feeding tube extends below the diaphragm. Aortic Atherosclerosis (ICD10-I70.0). Electronically Signed   By: Lowella Grip III M.D.   On: 05/13/2019 08:29    PHYSICAL EXAM      General - morbidly obese, well developed, not in acute distress.  Ophthalmologic - fundi not visualized due to noncooperation.  Cardiovascular - irregularly irregular heart rate and rhythm.  Neuro - awake alert, still anarthric, not able to answer orientation questions but able to write them out on a note bad, he is fully oriented. Able to repeat "I am here", very dysarthric but not able to repeat other sentences. Follows all simple commands PERRL, EOMI, blinking to visual threat bilaterally. Bilateral facial weakness but left seems more than right. Tongue protrusion difficulty. RUE 5/5, LUE 4+/5 proximal and 4/5 finger grip. RLE 5/5. LLE 4+/5 proximal and 5-/5 distal. DTR 1+ and no babinski. Sensation symmetrical. Bilateral FTN intact. Gait not tested.    ASSESSMENT/PLAN [name] is  a 63 y.o. male with history of a. Fib ( not anticoagulated) sick sinus syndrom, HTN, CVA, OSA ( not on CPAP) presenting with right side gaze and left arm paralysis.   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 Probably hyperdense R MCA bifurcation. Old R temporal lobe and L frontal lobe infarcts. .     CTA head & neck suboptimal. Occlusion R MCA at bifurcation.  CT perfusion large delayed perfusion R MCA. No core infarct.   Cerebral angio occluded R M1, TICI2c   MRI  R MCA infarct (operculum). B MCA encephalomalacia  MRA  Improved R MCA  patency  2D Echo EF 60-65%. No source of embolus   LDL 57   HgbA1c 5.5   HIV neg  Lovenox 40 mg sq daily for VTE prophylaxis  aspirin 81 mg daily prior to admission, off IV heparin d/t bleeding from penis, but resumed 11/26.  Therapy recommendations:  CIR->family prefer SNF  Disposition:  Pending.  Bed obtained. Need to address swallow proir to d/c   Atrial Fibrillation / SSS  Home anticoagulation:  not taking   Started on Xarelto 09/15/2018, documented not taking 04/23/2019 (family practice note)  off IV heparin d/t bleeding from penis, resumed 11/26.  Bradycardia with pauses (2 seconds) - Dr Rory Percy saw 11/20 - pt asymptomatic - currently rate in 70's - decreased metoprolol dose.   Hypertension  Home meds:  Lisinopril 20 . Treated w/ cleviprex, now off. Labetalol prn  . BP goal < 180 . BP stable now    . On lisinopril 10 to bid and metoprolol 12.5 bid . Long-term BP goal normotensive  Hyperlipidemia  Home meds:  Prescribed lipitor - ? taking  LDL 57, goal < 70  Now on lipitor 40  Continue statin at discharge  Dysphagia . Secondary to stroke . NPO . Speech on board . Need to address swallow plan prior to SNF placement ->Still NPO per speech therapy recommendation. On tube feedings and free water flush. . Trauma / IR cannot place PEG d/t gastroplasty in 1992. Bariatric GS saw. For exp lap vs open G vs open J tube. . Pending PEG placement - surgery on board  Tobacco abuse  Current smoker  Smoking cessation counseling provided  Pt is willing to quit   Other Stroke Risk Factors  ETOH use, alcohol level <10, advised to drink no more than 2 drink(s) a day  Substance abuse - UDS positive for Amphetamines   Morbid Obesity, Body mass index is 42.44 kg/m., hx failed open gastroplasty in 1993, recommend weight loss, diet and exercise as appropriate   Hx stroke/TIA by imaging - Old R temporal lobe and L frontal lobe infarcts - likely due to AFib  Obstructive  sleep apnea, not on CPAP at home  PVD   Other Active Problems  Leukocytosis, resolved.   COPD  Hypokalemia, resolved  Chronic pain syndrome, chronic B LBP w/ R sided sciatica, Pain from recent fall on narcotics at home. Followed at [facility] [place]  Adjustment d/o w/ mixed anxiety and depressed mood  LBP on oxycodone PRN  Hospital day # 14  [name], MD PhD Stroke Neurology 05/14/2019 5:45 PM  To contact Stroke Continuity provider, please refer to [email or web address] After hours, contact General Neurology

## 2019-05-14 NOTE — Progress Notes (Signed)
ANTICOAGULATION CONSULT NOTE - Follow Up Consult  Pharmacy Consult for Heparin Indication: atrial fibrillation  Allergies  Allergen Reactions  . Vioxx [Rofecoxib] Rash    Patient Measurements: Height: 6\' 3"  (190.5 cm) Weight: (!) 339 lb 8.1 oz (154 kg) IBW/kg (Calculated) : 84.5 Heparin Dosing Weight:    Vital Signs: Temp: 98.2 F (36.8 C) (11/29 0750) Temp Source: Oral (11/29 0750) BP: 138/76 (11/29 0750) Pulse Rate: 54 (11/29 0750)  Labs: Recent Labs    05/12/19 0735  05/13/19 0336 05/13/19 1417 05/14/19 0714  HGB 13.2  --  12.9*  --  13.7  HCT 40.4  --  39.1  --  42.7  PLT 220  --  191  --  248  HEPARINUNFRC 0.19*   < > 0.29* 0.44 0.42  CREATININE  --   --  0.52*  --   --    < > = values in this interval not displayed.    Estimated Creatinine Clearance: 154 mL/min (A) (by C-G formula based on SCr of 0.52 mg/dL (L)).   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 Hx of Afib with stroke likely d/t not being on anticoagulation. Hep had been on hold d/t penile bleeding now resolved and stroke team wants to resume, planned PEG sometime next week. Pharmacy consulted to dose.   Heparin level this morning remains therapeutic (HL 0.42 << 0.44, goal of 0.3-0.5). CBC stable - no bleeding noted. Noted surgery to reassess for PEG on Monday.   Goal of Therapy:  Heparin level 0.3-0.5 Monitor platelets by anticoagulation protocol: Yes   Plan:  - Continue Heparin at 2350 units/hr (23.5 ml/hr) - Will continue to monitor for any signs/symptoms of bleeding and will follow up with heparin level in the a.m.   Thank you for allowing pharmacy to be a part of this patient's care.  Alycia Rossetti, PharmD, BCPS Clinical Pharmacist Clinical phone for 05/14/2019: [phone] 05/14/2019 8:47 AM   **Pharmacist phone directory can now be found on [email or web address] ([email or web address]).  Listed under [place].

## 2019-05-15 LAB — GLUCOSE, CAPILLARY
Glucose-Capillary: 96 mg/dL (ref 70–99)
Glucose-Capillary: 98 mg/dL (ref 70–99)
Glucose-Capillary: 95 mg/dL (ref 70–99)
Glucose-Capillary: 100 mg/dL — ABNORMAL HIGH (ref 70–99)
Glucose-Capillary: 108 mg/dL — ABNORMAL HIGH (ref 70–99)

## 2019-05-15 LAB — CBC
HCT: 42.2 % (ref 39.0–52.0)
Hemoglobin: 14 g/dL (ref 13.0–17.0)
MCH: 28 pg (ref 26.0–34.0)
MCHC: 33.2 g/dL (ref 30.0–36.0)
MCV: 84.4 fL (ref 80.0–100.0)
Platelets: 234 10*3/uL (ref 150–400)
RBC: 5 MIL/uL (ref 4.22–5.81)
RDW: 15.4 % (ref 11.5–15.5)
WBC: 7.4 10*3/uL (ref 4.0–10.5)
nRBC: 0 % (ref 0.0–0.2)

## 2019-05-15 LAB — BASIC METABOLIC PANEL
Anion gap: 9 (ref 5–15)
BUN: 13 mg/dL (ref 8–23)
CO2: 27 mmol/L (ref 22–32)
Calcium: 9 mg/dL (ref 8.9–10.3)
Chloride: 99 mmol/L (ref 98–111)
Creatinine, Ser: 0.62 mg/dL (ref 0.61–1.24)
GFR calc Af Amer: >60 mL/min (ref >60)
GFR calc non Af Amer: >60 mL/min (ref >60)
Glucose, Bld: 104 mg/dL — ABNORMAL HIGH (ref 70–99)
Potassium: 4.1 mmol/L (ref 3.5–5.1)
Sodium: 135 mmol/L (ref 135–145)

## 2019-05-15 LAB — HEPARIN LEVEL (UNFRACTIONATED)
Heparin Unfractionated: 0.26 IU/mL — ABNORMAL LOW (ref 0.30–0.70)
Heparin Unfractionated: 0.43 IU/mL (ref 0.30–0.70)

## 2019-05-15 MED ORDER — HEPARIN (PORCINE) 25000 UT/250ML-% IV SOLN
2500.0000 [IU]/h | INTRAVENOUS | Status: AC
  Administered 2019-05-15 (×2): 2500 [IU]/h via INTRAVENOUS
  Filled 2019-05-15: qty 250

## 2019-05-15 NOTE — Progress Notes (Addendum)
15 Days Post-Op   Subjective/Chief Complaint: Pt with no acute changes con't with feeding tube and heparin   Objective: Vital signs in last 24 hours: Temp:  [97.8 F (36.6 C)-98.8 F (37.1 C)] 98.1 F (36.7 C) (11/30 0730) Pulse Rate:  [60-75] 66 (11/30 0730) Resp:  [17-20] 18 (11/30 0730) BP: (124-152)/(78-87) 148/78 (11/30 0730) SpO2:  [92 %-98 %] 98 % (11/30 0730) Last BM Date: 05/14/19  Intake/Output from previous day: 11/29 0701 - 11/30 0700 In: 3840.5 [I.V.:1237; NG/GT:2603.6] Out: 1930 [Urine:1930] Intake/Output this shift: No intake/output data recorded.  Constitutional: No acute distress, conversant, appears states age. Eyes: Anicteric sclerae, moist conjunctiva, no lid lag Lungs: Clear to auscultation bilaterally, normal respiratory effort CV: regular rate and rhythm, no murmurs, no peripheral edema, pedal pulses 2+ GI: Soft, no masses or hepatosplenomegaly, non-tender to palpation Skin: No rashes, palpation reveals normal turgor Psychiatric: appropriate judgment and insight, oriented to person, place, and time   Lab Results:  Recent Labs    05/14/19 0714 05/15/19 0401  WBC 6.4 7.4  HGB 13.7 14.0  HCT 42.7 42.2  PLT 248 234   BMET Recent Labs    05/14/19 0714 05/15/19 0401  NA 138 135  K 3.8 4.1  CL 100 99  CO2 28 27  GLUCOSE 96 104*  BUN 12 13  CREATININE 0.65 0.62  CALCIUM 8.7* 9.0   Anti-infectives: Anti-infectives (From admission, onward)   Start     Dose/Rate Route Frequency Ordered Stop   05/09/19 0600  cefoTEtan (CEFOTAN) 2 g in sodium chloride 0.9 % 100 mL IVPB     2 g 200 mL/hr over 30 Minutes Intravenous On call to O.R. 05/08/19 1533 05/10/19 0559   04/30/19 1227  ceFAZolin (ANCEF) 2-4 GM/100ML-% IVPB    Note to Pharmacy: Margaretmary Dys   : cabinet override      04/30/19 1227 05/01/19 0029      Assessment/Plan: Atrial fibrillation HTN H/o CVA Tobacco abuse OSA  Right MCAinfarcts/pTICI 2c revascularizationby  IR11/15/20 Dysphagia H/o failed gastroplasty in 1992, modified sleeve gastrectomy/ 3 hours of lysis of adhesionsby Dr. Hassell Done in 2013 - Patient with complicated abdominal surgical history and the need for gastrostomy tube placement for tube feedings in the setting of acute stroke.Would plan diagnostic laparoscopy, lap vs open G vs J tube depending on anatomy. He is high riskof complications and periop mortality to include but limited to: infection, bleeding, damage to surrounding structures, possible need for further surgery, death, stroke, MI, and other associated risks.  He acknowledges understanding. - patient is ready for surgery and will plan for surgery Tues -stop heparin in 0600 -DC tube feedings at midnight  ID -none currently VTE -SCDs,  FEN -in TFs Foley -none Follow up -TBD   LOS: 15 days    Ralene Ok 05/15/2019

## 2019-05-15 NOTE — Progress Notes (Signed)
Occupational Therapy Treatment Patient Details Name: Kyle [name] MRN: [id number] DOB: April 27, 1956 Today's Date: 05/15/2019    History of present illness Kyle [name] is an 63 y.o. male  With PMH a. Fib ( not anticoagulated) sick sinus syndrom, HTN, CVA, OSA ( not on CPAP) who presented to [facility] as a code stroke. Pt found to have R MCA acute ischemic stroke due to right M1 occlusion, pt s/p mechanical thrombectomy.   OT comments  Pt met goals, updated this session. Pt currently requires minA+2 for safety with functional mobility with ADL at RW level. He reports increased pain in L knee and reports hearing clicking when walking. Pt completed grooming while seated this session after setup. Pt completed general UE exercises while sitting in recliner. Pt will continue to benefit from skilled OT services to maximize safety and independence with ADL/IADL and functional mobility. Will continue to follow acutely and progress as tolerated.    Follow Up Recommendations  CIR;Supervision/Assistance - 24 hour    Equipment Recommendations  Other (comment)(TBD)    Recommendations for Other Services Rehab consult    Precautions / Restrictions Precautions Precautions: Fall Precaution Comments: strong L lateral lean Restrictions Weight Bearing Restrictions: No       Mobility Bed Mobility Overal bed mobility: Needs Assistance Bed Mobility: Supine to Sit     Supine to sit: Min assist;+2 for safety/equipment     General bed mobility comments: pt progressed to EOB with minA for safety   Transfers Overall transfer level: Needs assistance Equipment used: Rolling walker (2 wheeled) Transfers: Sit to/from Stand Sit to Stand: Min assist;+2 safety/equipment         General transfer comment: pt reported dizziness sitting EOB spO2 on RA 90%, pt unable to maintain SpO2 >92% on RA, provided supplemental O2 1lnc pt able to maintain spo2 >93%     Balance Overall balance assessment: Needs  assistance Sitting-balance support: Feet supported;Single extremity supported Sitting balance-Leahy Scale: Good Sitting balance - Comments: Able to functionally reach with supervision   Standing balance support: Bilateral upper extremity supported;During functional activity Standing balance-Leahy Scale: Fair Standing balance comment: dependent on RW                           ADL either performed or assessed with clinical judgement   ADL Overall ADL's : Needs assistance/impaired     Grooming: Maximal assistance                   Toilet Transfer: Moderate assistance;Minimal assistance;Ambulation Toilet Transfer Details (indicate cue type and reason): simulated from EOB to recliner;limited in distance due to pain in knees Toileting- Clothing Manipulation and Hygiene: Maximal assistance       Functional mobility during ADLs: Minimal assistance;Moderate assistance;Rolling walker General ADL Comments: pt limited by pain in knees this session;     Vision   Vision Assessment?: Vision impaired- to be further tested in functional context   Perception     Praxis      Cognition Arousal/Alertness: Awake/alert Behavior During Therapy: WFL for tasks assessed/performed Overall Cognitive Status: Difficult to assess Area of Impairment: Safety/judgement;Following commands;Attention;Awareness;Problem solving                   Current Attention Level: Selective   Following Commands: Follows one step commands with increased time Safety/Judgement: Decreased awareness of deficits Awareness: Emergent Problem Solving: Requires verbal cues General Comments: Pt continues to write on paper to communicate which  makes assesing cognition difficult.  He responds with head nods, pointing and gestures.        Exercises Other Exercises Other Exercises: seated LAQ, marching x 15 reps each   Shoulder Instructions       General Comments      Pertinent Vitals/ Pain        Pain Assessment: Faces Faces Pain Scale: Hurts even more Pain Location: B knees ( L>R) Pain Descriptors / Indicators: Aching;Sore Pain Intervention(s): Limited activity within patient's tolerance;Monitored during session  Home Living                                          Prior Functioning/Environment              Frequency  Min 2X/week        Progress Toward Goals  OT Goals(current goals can now be found in the care plan section)  Progress towards OT goals: Progressing toward goals;Goals met and updated - see care plan  Acute Rehab OT Goals OT Goal Formulation: With patient Time For Goal Achievement: 05/29/19 Potential to Achieve Goals: Good ADL Goals Pt Will Perform Grooming: standing;with supervision Pt Will Perform Lower Body Dressing: sitting/lateral leans;sit to/from stand;with supervision Pt Will Transfer to Toilet: with supervision;ambulating Additional ADL Goal #1: Pt will complete multistep ADL locating all items independently.  Additional ADL Goal #2: Pt will follow multistep command consistently during ADL completion  Plan Discharge plan remains appropriate;Frequency remains appropriate    Co-evaluation    PT/OT/SLP Co-Evaluation/Treatment: Yes Reason for Co-Treatment: For patient/therapist safety;To address functional/ADL transfers PT goals addressed during session: Mobility/safety with mobility;Proper use of DME;Balance OT goals addressed during session: ADL's and self-care      AM-PAC OT "6 Clicks" Daily Activity     Outcome Measure   Help from another person eating meals?: Total Help from another person taking care of personal grooming?: A Little Help from another person toileting, which includes using toliet, bedpan, or urinal?: A Little Help from another person bathing (including washing, rinsing, drying)?: A Lot Help from another person to put on and taking off regular upper body clothing?: A Little Help from another  person to put on and taking off regular lower body clothing?: Total 6 Click Score: 13    End of Session Equipment Utilized During Treatment: Rolling walker;Oxygen  OT Visit Diagnosis: Unsteadiness on feet (R26.81);Other abnormalities of gait and mobility (R26.89);Hemiplegia and hemiparesis;Cognitive communication deficit (R41.841);Other symptoms and signs involving cognitive function Symptoms and signs involving cognitive functions: Cerebral infarction Hemiplegia - Right/Left: Left Hemiplegia - dominant/non-dominant: Non-Dominant Hemiplegia - caused by: Cerebral infarction   Activity Tolerance Patient tolerated treatment well   Patient Left with call bell/phone within reach;in chair;with chair alarm set   Nurse Communication Mobility status        Time: 9536-9223 OT Time Calculation (min): 26 min  Charges: OT General Charges $OT Visit: 1 Visit OT Treatments $Self Care/Home Management : 8-22 mins  Dorinda Hill OTR/L Acute Rehabilitation Services Office: [place] 05/15/2019, 1:59 PM

## 2019-05-15 NOTE — Progress Notes (Signed)
ANTICOAGULATION CONSULT NOTE - Follow Up Consult  Pharmacy Consult for Heparin Indication: atrial fibrillation  Allergies  Allergen Reactions  . Vioxx [Rofecoxib] Rash    Patient Measurements: Height: 6\' 3"  (190.5 cm) Weight: (!) 339 lb 8.1 oz (154 kg) IBW/kg (Calculated) : 84.5 Heparin Dosing Weight:  120.1 kg  Vital Signs: Temp: 98.5 F (36.9 C) (11/30 1603) Temp Source: Oral (11/30 1603) BP: 139/91 (11/30 1603) Pulse Rate: 87 (11/30 1603)  Labs: Recent Labs    05/13/19 0336  05/14/19 0714 05/15/19 0401 05/15/19 1554  HGB 12.9*  --  13.7 14.0  --   HCT 39.1  --  42.7 42.2  --   PLT 191  --  248 234  --   HEPARINUNFRC 0.29*   < > 0.42 0.26* 0.43  CREATININE 0.52*  --  0.65 0.62  --    < > = values in this interval not displayed.    Estimated Creatinine Clearance: 154 mL/min (by C-G formula based on SCr of 0.62 mg/dL).   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 Hx of Afib with stroke likely d/t not being on anticoagulation. Heparin had been on hold d/t penile bleeding, now resolved, and stroke team wants to resume, planned PEG 12/1.  Hep now therapeutic   Surgery planning heparin off at 0600 on 12/1 for PEG.  Goal of Therapy:  Heparin level 0.3-0.5 Monitor platelets by anticoagulation protocol: Yes   Plan:  Continue heparin 2500 units/hr Monitor daily heparin level and CBC, s/sx bleeding Heparin off 12/1 at 0600 per Surgery for PEG - stop time entered  Barth Kirks, PharmD, BCPS, BCCCP Clinical Pharmacist [phone]  Please check AMION for all [place] numbers  05/15/2019 4:38 PM

## 2019-05-15 NOTE — Progress Notes (Signed)
STROKE TEAM PROGRESS NOTE   INTERVAL HISTORY Pt lying in bed, nursing is going to give him a bath. Awake alert, no neuro changes. Pending PEG tomorrow.   Vitals:   05/15/19 0730 05/15/19 0900 05/15/19 1155 05/15/19 1237  BP: (!) 148/78  137/77   Pulse: 66  60   Resp: 18  18   Temp: 98.1 F (36.7 C)  98.3 F (36.8 C)   TempSrc: Oral  Oral   SpO2: 98%  96% 90%  Weight:  (!) 154 kg    Height:  6\' 3"  (1.905 m)      CBC:  Recent Labs  Lab 05/14/19 0714 05/15/19 0401  WBC 6.4 7.4  HGB 13.7 14.0  HCT 42.7 42.2  MCV 86.1 84.4  PLT 248 [id number]   Basic Metabolic Panel:  Recent Labs  Lab 05/14/19 0714 05/15/19 0401  NA 138 135  K 3.8 4.1  CL 100 99  CO2 28 27  GLUCOSE 96 104*  BUN 12 13  CREATININE 0.65 0.62  CALCIUM 8.7* 9.0   Lipid Panel:     Component Value Date/Time   CHOL 102 05/01/2019 0630   CHOL 142 12/19/2014 0804   TRIG 98 05/01/2019 0630   HDL 25 (L) 05/01/2019 0630   HDL 29 (L) 12/19/2014 0804   CHOLHDL 4.1 05/01/2019 0630   VLDL 20 05/01/2019 0630   LDLCALC 57 05/01/2019 0630   LDLCALC 93 12/19/2014 0804   HgbA1c:  Lab Results  Component Value Date   HGBA1C 5.5 05/01/2019   Urine Drug Screen:     Component Value Date/Time   LABOPIA NONE DETECTED 04/30/2019 1507   COCAINSCRNUR NONE DETECTED 04/30/2019 1507   COCAINSCRNUR Negative 03/15/2015 1141   LABBENZ NONE DETECTED 04/30/2019 1507   AMPHETMU POSITIVE (A) 04/30/2019 1507   THCU NONE DETECTED 04/30/2019 1507   LABBARB NONE DETECTED 04/30/2019 1507    Alcohol Level     Component Value Date/Time   ETH <10 04/30/2019 1152    IMAGING past 24h  No results found.  PHYSICAL EXAM   General - morbidly obese, well developed, not in acute distress.  Ophthalmologic - fundi not visualized due to noncooperation.  Cardiovascular - irregularly irregular heart rate and rhythm.  Neuro - awake alert, still anarthric, not able to answer orientation questions but able to write them out on a note  bad, he is fully oriented. Able to repeat "I am here", very dysarthric but not able to repeat other sentences. Follows all simple commands PERRL, EOMI, blinking to visual threat bilaterally. Bilateral facial weakness but left seems more than right. Tongue protrusion difficulty. RUE 5/5, LUE 4+/5 proximal and 4/5 finger grip. RLE 5/5. LLE 4+/5 proximal and 5-/5 distal. DTR 1+ and no babinski. Sensation symmetrical. Bilateral FTN intact. Gait not tested.    ASSESSMENT/PLAN [name] is a 63 y.o. male with history of a. Fib ( not anticoagulated) sick sinus syndrom, HTN, CVA, OSA ( not on CPAP) presenting with right side gaze and left arm paralysis.   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 Probably hyperdense R MCA bifurcation. Old R temporal lobe and L frontal lobe infarcts. .     CTA head & neck suboptimal. Occlusion R MCA at bifurcation.  CT perfusion large delayed perfusion R MCA. No core infarct.   Cerebral angio occluded R M1,  TICI2c   MRI  R MCA infarct (operculum). B MCA encephalomalacia  MRA  Improved R MCA patency  2D Echo EF 60-65%. No source of embolus   LDL 57   HgbA1c 5.5   HIV neg  Lovenox 40 mg sq daily for VTE prophylaxis  aspirin 81 mg daily prior to admission, off IV heparin d/t bleeding from penis, but resumed 11/26.  Therapy recommendations:  CIR->family prefer SNF  Disposition:  Pending.  Bed obtained. Insurance pending. Needs PEG proir to d/c   Will need repeat COVID prior to d/c  Atrial Fibrillation / SSS  Home anticoagulation:  not taking   Started on Xarelto 09/15/2018, documented not taking 04/23/2019 (family practice note)  off IV heparin d/t bleeding from penis, resumed 11/26.  Bradycardia with pauses (2 seconds) - Dr Rory Percy saw 11/20 - pt asymptomatic - currently rate in 70's - decreased metoprolol dose.    Hypertension  Home meds:  Lisinopril 20 . Treated w/ cleviprex, now off. Labetalol prn  . BP goal < 180 . BP stable now    . On lisinopril 10 to bid and metoprolol 12.5 bid . Long-term BP goal normotensive  Hyperlipidemia  Home meds:  Prescribed lipitor - ? taking  LDL 57, goal < 70  Now on lipitor 40  Continue statin at discharge  Dysphagia . Secondary to stroke . NPO . Speech on board . Need to address swallow plan prior to SNF placement ->Still NPO per speech therapy recommendation. On tube feedings and free water flush. . Pending PEG placement - surgery on board for Tues  Tobacco abuse  Current smoker  Smoking cessation counseling provided  Pt is willing to quit   Other Stroke Risk Factors  ETOH use, alcohol level <10, advised to drink no more than 2 drink(s) a day  Substance abuse - UDS positive for Amphetamines   Morbid Obesity, Body mass index is 42.44 kg/m., hx failed open gastroplasty in 1993, recommend weight loss, diet and exercise as appropriate   Hx stroke/TIA by imaging - Old R temporal lobe and L frontal lobe infarcts - likely due to AFib  Obstructive sleep apnea, not on CPAP at home  PVD   Other Active Problems  Leukocytosis, resolved.   COPD  Hypokalemia, resolved  Chronic pain syndrome, chronic B LBP w/ R sided sciatica, Pain from recent fall on narcotics at home. Followed at [facility] OP Pain [place]  Adjustment d/o w/ mixed anxiety and depressed mood  LBP on oxycodone PRN  Hospital day # 15  [name], MD PhD Stroke Neurology 05/15/2019 2:59 PM  To contact Stroke Continuity provider, please refer to [email or web address] After hours, contact General Neurology

## 2019-05-15 NOTE — TOC Progression Note (Signed)
Transition of Care [facility]) - Progression Note    Patient Details  Name: Kyle [name] MRN: [id number] Date of Birth: 04-02-1956  Transition of Care [facility]) CM/SW [place], [place] Phone Number: 05/15/2019, 1:33 PM  Clinical Narrative:    Per hand off pt has accepted offer at [facility]. Await PEG placement to intiate insurance approval. Pt managed by regular [employer] (authoization to be submitted by [employer] will also need new COVID.    Expected Discharge Plan: [place] Barriers to Discharge: Continued Medical Work up, Ship broker  Expected Discharge Plan and Services Expected Discharge Plan: [place] arrangements for the past 2 months: Single Family Home                   Readmission Risk Interventions No flowsheet data found.

## 2019-05-15 NOTE — Progress Notes (Signed)
ANTICOAGULATION CONSULT NOTE - Follow Up Consult  Pharmacy Consult for Heparin Indication: atrial fibrillation  Allergies  Allergen Reactions  . Vioxx [Rofecoxib] Rash    Patient Measurements: Height: 6\' 3"  (190.5 cm) Weight: (!) 339 lb 8.1 oz (154 kg) IBW/kg (Calculated) : 84.5 Heparin Dosing Weight:  120.1 kg  Vital Signs: Temp: 98.1 F (36.7 C) (11/30 0730) Temp Source: Oral (11/30 0730) BP: 148/78 (11/30 0730) Pulse Rate: 66 (11/30 0730)  Labs: Recent Labs    05/13/19 0336 05/13/19 1417 05/14/19 0714 05/15/19 0401  HGB 12.9*  --  13.7 14.0  HCT 39.1  --  42.7 42.2  PLT 191  --  248 234  HEPARINUNFRC 0.29* 0.44 0.42 0.26*  CREATININE 0.52*  --  0.65 0.62    Estimated Creatinine Clearance: 154 mL/min (by C-G formula based on SCr of 0.62 mg/dL).   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 Hx of Afib with stroke likely d/t not being on anticoagulation. Heparin had been on hold d/t penile bleeding, now resolved, and stroke team wants to resume, planned PEG 12/1.  Heparin level this morning now slightly subtherapeutic at 0.26, goal of 0.3-0.5). Noted, heparin charted at 2000 units/hr in [place] but confirmed with RN that heparin is running at 2350 units/hr on the pump as ordered. CBC stable. No bleeding or issues with infusion per discussion with RN. Surgery planning heparin off at 0600 on 12/1 for PEG.  Goal of Therapy:  Heparin level 0.3-0.5 Monitor platelets by anticoagulation protocol: Yes   Plan:  Increase heparin to 2500 units/hr 6h heparin level Monitor daily heparin level and CBC, s/sx bleeding Heparin off 12/1 at 0600 per Surgery for PEG - stop time entered   Elicia Lamp, PharmD, BCPS Please check AMION for all [place] contact numbers Clinical Pharmacist 05/15/2019 9:11 AM

## 2019-05-15 NOTE — Progress Notes (Signed)
  Speech Language Pathology Treatment: Dysphagia;Cognitive-Linquistic  Patient Details Name: Kyle [name] MRN: [id number]:[id number] DOB: 09/16/1955 Today's Date: 05/15/2019 Time: 1038-1100 SLP Time Calculation (min) (ACUTE ONLY): 22 min  Assessment / Plan / Recommendation Clinical Impression  Pt was encountered asleep in bed; however, he roused to moderate verbal stimulation and he was very motivated to participate in ST tx session.  Tx focused on dysphagia and dysarthria on this date.  SLP completed oral care with suction swab and oral rinse.  Pt independently suctioned his oral cavity following oral care and he achieved labial closure around the suction tip given minimal verbal cues.  Pt completed lingual lateralization exercises x6 in which he attempted to medialize a swab that was place in either his left or right buccal cavity.  He was observed with an ice chip x1 and he exhibited good bolus acceptance with labial closure and some lingual manipulation observed.  Suspect delayed swallow initiation with an immediate cough and wet vocal quality after swallow initiation.  Pt imitated the phonemes /a/, /i/, and /u/ given extra time.  Some straining and increased effort was observed during production of the /i/, and /u/ phonemes.  Pt continues to use written language as his best communication method.  He reported that he did not know where his cell phone, wallet, or dentures were.  Dentures were found in his personal belonging and RN was made aware that he was looking for his phone and wallet.  RN reported that pt is scheduled to get a PEG tube tomorrow.  SLP will continue to f/u for dysphagia and cognitive-linguistic treatment per POC.     HPI HPI: Kyle [name] is a 63 y.o. M with hx of CVA, sleep apnea, SOB, peripheral vascular disease, HPT, GERD, gastric bypass surgery(2013), atrial fibrillation, arthritis, tobacco use, and ETOH admitted 11/15 after friend found sitting in car, confused. Head CT Showed  hyperdense R MCA bifurcation, no acute infarct and atrophy with chronic temporal infarct / chronic L frontal infarct. MRI showed R MCA restricuted diffusion primarily involving operculum. No associated hemorrhage or mass efect. Underlying bilateral MCA chronic encephalomalacia. Briefly intubated 11/15 for arteriogram. Recently admitted to ED 11/06 for fall with R ankle sprain and 11/08 for another fall, NR reported pt is chronic opiate user due to chronic back pain. Diet currently NPO.       SLP Plan  Continue with current plan of care       Recommendations  Diet recommendations: NPO Medication Administration: Via alternative means                Oral Care Recommendations: Oral care QID Follow up Recommendations: Inpatient Rehab SLP Visit Diagnosis: Dysphagia, unspecified (R13.10);Dysarthria and anarthria (R47.1) Plan: Continue with current plan of care                      Colin Mulders., M.S., [place] Office: [phone]  [place] 05/15/2019, 11:12 AM

## 2019-05-15 NOTE — Progress Notes (Signed)
Physical Therapy Treatment Patient Details Name: Kyle [name] MRN: [id number] DOB: Jul 20, 1955 Today's Date: 05/15/2019    History of Present Illness Kyle [name] is an 63 y.o. male  With PMH a. Fib ( not anticoagulated) sick sinus syndrom, HTN, CVA, OSA ( not on CPAP) who presented to [facility] as a code stroke. Pt found to have R MCA acute ischemic stroke due to right M1 occlusion, pt s/p mechanical thrombectomy.    PT Comments    Patient received in bed. Agrees to PT/OT session. Patient performed bed mobility with min +2 assist. Heavy use of bed rails to pull up to sitting position. Patient is able to transfer sit to stand with +2 min assist. Very wide BOS. He has difficulty taking steps this day due to reported B knee pain. Able to go from bed to chair with +2 min assist using RW. Labored, slow pace. Cues for reaching back to recliner prior to sitting. Patient will benefit from continued skilled PT while here to improve strength and functional mobility.        Follow Up Recommendations  SNF     Equipment Recommendations  Other (comment)(TBD next venue)    Recommendations for Other Services       Precautions / Restrictions Precautions Precautions: Fall Restrictions Weight Bearing Restrictions: No    Mobility  Bed Mobility Overal bed mobility: Needs Assistance Bed Mobility: Supine to Sit     Supine to sit: Min assist;+2 for safety/equipment        Transfers Overall transfer level: Needs assistance Equipment used: Rolling walker (2 wheeled) Transfers: Sit to/from Stand Sit to Stand: Min assist;+2 safety/equipment            Ambulation/Gait Ambulation/Gait assistance: Min assist;+2 safety/equipment Gait Distance (Feet): 5 Feet Assistive device: Rolling walker (2 wheeled) Gait Pattern/deviations: Wide base of support;Shuffle;Step-to pattern Gait velocity: very decreased   General Gait Details: due to pain, patient ambulated to recliner only. Difficulty taking  steps, right foot kept ouside of RW despite cues to try to bring it inside RW. Labored movement.   Stairs             Wheelchair Mobility    Modified Rankin (Stroke Patients Only) Modified Rankin (Stroke Patients Only) Pre-Morbid Rankin Score: No significant disability Modified Rankin: Moderately severe disability     Balance Overall balance assessment: Needs assistance Sitting-balance support: Feet supported;Single extremity supported Sitting balance-Leahy Scale: Good     Standing balance support: Bilateral upper extremity supported;During functional activity Standing balance-Leahy Scale: Fair Standing balance comment: dependent on RW                            Cognition Arousal/Alertness: Awake/alert Behavior During Therapy: WFL for tasks assessed/performed Overall Cognitive Status: Difficult to assess Area of Impairment: Safety/judgement;Following commands                   Current Attention Level: Selective   Following Commands: Follows one step commands with increased time Safety/Judgement: Decreased awareness of deficits   Problem Solving: Requires verbal cues General Comments: Pt continues to write on paper to communicate which makes assesing cognition difficult.  He responds with head nods, pointing and gestures.      Exercises Other Exercises Other Exercises: seated LAQ, marching x 15 reps each    General Comments        Pertinent Vitals/Pain Pain Assessment: Faces Faces Pain Scale: Hurts even more Pain Location: B knees (  L>R) Pain Descriptors / Indicators: Aching;Sore Pain Intervention(s): Limited activity within patient's tolerance;Monitored during session    Home Living                      Prior Function            PT Goals (current goals can now be found in the care plan section) Acute Rehab PT Goals PT Goal Formulation: With patient Time For Goal Achievement: 05/19/19 Potential to Achieve Goals:  Fair Progress towards PT goals: Progressing toward goals    Frequency    Min 3X/week      PT Plan Current plan remains appropriate    Co-evaluation PT/OT/SLP Co-Evaluation/Treatment: Yes Reason for Co-Treatment: To address functional/ADL transfers;For patient/therapist safety PT goals addressed during session: Mobility/safety with mobility;Proper use of DME;Balance        AM-PAC PT "6 Clicks" Mobility   Outcome Measure  Help needed turning from your back to your side while in a flat bed without using bedrails?: A Lot Help needed moving from lying on your back to sitting on the side of a flat bed without using bedrails?: A Lot Help needed moving to and from a bed to a chair (including a wheelchair)?: A Lot Help needed standing up from a chair using your arms (e.g., wheelchair or bedside chair)?: A Little Help needed to walk in hospital room?: A Lot Help needed climbing 3-5 steps with a railing? : A Lot 6 Click Score: 13    End of Session Equipment Utilized During Treatment: Gait belt;Oxygen Activity Tolerance: Patient tolerated treatment well;Patient limited by pain Patient left: in chair;with chair alarm set Nurse Communication: Mobility status PT Visit Diagnosis: Difficulty in walking, not elsewhere classified (R26.2);Muscle weakness (generalized) (M62.81)     Time: 1200-1230 PT Time Calculation (min) (ACUTE ONLY): 30 min  Charges:  $Gait Training: 8-22 mins                     Donterrius Santucci, PT, GCS 05/15/19,12:48 PM

## 2019-05-16 ENCOUNTER — Encounter (HOSPITAL_COMMUNITY): Payer: Self-pay | Admitting: Anesthesiology

## 2019-05-16 ENCOUNTER — Encounter (HOSPITAL_COMMUNITY)
Admission: EM | Disposition: A | Discharge status: Skilled Nursing Facility | Payer: Self-pay | Source: Home / Self Care | Attending: Neurology

## 2019-05-16 ENCOUNTER — Inpatient Hospital Stay (HOSPITAL_COMMUNITY): Payer: Medicare PPO | Admitting: Certified Registered Nurse Anesthetist

## 2019-05-16 HISTORY — PX: LAPAROSCOPIC INSERTION GASTROSTOMY TUBE: SHX6817

## 2019-05-16 LAB — GLUCOSE, CAPILLARY
Glucose-Capillary: 101 mg/dL — ABNORMAL HIGH (ref 70–99)
Glucose-Capillary: 109 mg/dL — ABNORMAL HIGH (ref 70–99)
Glucose-Capillary: 112 mg/dL — ABNORMAL HIGH (ref 70–99)
Glucose-Capillary: 78 mg/dL (ref 70–99)
Glucose-Capillary: 92 mg/dL (ref 70–99)
Glucose-Capillary: 93 mg/dL (ref 70–99)
Glucose-Capillary: 98 mg/dL (ref 70–99)

## 2019-05-16 LAB — BASIC METABOLIC PANEL
Anion gap: 10 (ref 5–15)
BUN: 15 mg/dL (ref 8–23)
CO2: 28 mmol/L (ref 22–32)
Calcium: 9 mg/dL (ref 8.9–10.3)
Chloride: 99 mmol/L (ref 98–111)
Creatinine, Ser: 0.67 mg/dL (ref 0.61–1.24)
GFR calc Af Amer: >60 mL/min (ref >60)
GFR calc non Af Amer: >60 mL/min (ref >60)
Glucose, Bld: 96 mg/dL (ref 70–99)
Potassium: 4.1 mmol/L (ref 3.5–5.1)
Sodium: 137 mmol/L (ref 135–145)

## 2019-05-16 LAB — CBC
HCT: 44.6 % (ref 39.0–52.0)
Hemoglobin: 14.2 g/dL (ref 13.0–17.0)
MCH: 27.2 pg (ref 26.0–34.0)
MCHC: 31.8 g/dL (ref 30.0–36.0)
MCV: 85.4 fL (ref 80.0–100.0)
Platelets: 263 10*3/uL (ref 150–400)
RBC: 5.22 MIL/uL (ref 4.22–5.81)
RDW: 15.4 % (ref 11.5–15.5)
WBC: 7.4 10*3/uL (ref 4.0–10.5)
nRBC: 0 % (ref 0.0–0.2)

## 2019-05-16 LAB — URINE CULTURE: Culture: 70000 — AB

## 2019-05-16 LAB — SARS CORONAVIRUS 2 (TAT 6-24 HRS): SARS Coronavirus 2: NEGATIVE

## 2019-05-16 LAB — HEPARIN LEVEL (UNFRACTIONATED)
Heparin Unfractionated: 0.37 IU/mL (ref 0.30–0.70)
Heparin Unfractionated: 0.56 IU/mL (ref 0.30–0.70)

## 2019-05-16 SURGERY — INSERTION, GASTROSTOMY TUBE, PERCUTANEOUS
Anesthesia: General

## 2019-05-16 MED ORDER — LACTATED RINGERS IV SOLN: INTRAVENOUS | Status: DC

## 2019-05-16 MED ORDER — PROPOFOL 10 MG/ML IV BOLUS
INTRAVENOUS | Status: DC | PRN
  Administered 2019-05-16: 120 mg via INTRAVENOUS

## 2019-05-16 MED ORDER — DEXAMETHASONE SODIUM PHOSPHATE 10 MG/ML IJ SOLN
INTRAMUSCULAR | Status: AC
  Filled 2019-05-16: qty 1

## 2019-05-16 MED ORDER — ACETAMINOPHEN 10 MG/ML IV SOLN
1000.0000 mg | Freq: Once | INTRAVENOUS | Status: DC | PRN
  Administered 2019-05-16: 12:00:00 1000 mg via INTRAVENOUS

## 2019-05-16 MED ORDER — SUGAMMADEX SODIUM 200 MG/2ML IV SOLN
INTRAVENOUS | Status: DC | PRN
  Administered 2019-05-16: 310 mg via INTRAVENOUS

## 2019-05-16 MED ORDER — OXYCODONE HCL 5 MG/5ML PO SOLN
10.0000 mg | ORAL | Status: DC | PRN
  Administered 2019-05-16 – 2019-05-18 (×8): 10 mg
  Filled 2019-05-16 (×9): qty 10

## 2019-05-16 MED ORDER — FENTANYL CITRATE (PF) 250 MCG/5ML IJ SOLN
INTRAMUSCULAR | Status: AC
  Filled 2019-05-16: qty 5

## 2019-05-16 MED ORDER — ONDANSETRON HCL 4 MG/2ML IJ SOLN
INTRAMUSCULAR | Status: AC
  Filled 2019-05-16: qty 2

## 2019-05-16 MED ORDER — ACETAMINOPHEN 10 MG/ML IV SOLN
INTRAVENOUS | Status: AC
  Filled 2019-05-16: qty 100

## 2019-05-16 MED ORDER — BUPIVACAINE-EPINEPHRINE 0.25% -1:200000 IJ SOLN
INTRAMUSCULAR | Status: DC | PRN
  Administered 2019-05-16: 8 mL

## 2019-05-16 MED ORDER — FENTANYL CITRATE (PF) 100 MCG/2ML IJ SOLN: 25.0000 ug | INTRAMUSCULAR | Status: DC | PRN

## 2019-05-16 MED ORDER — 0.9 % SODIUM CHLORIDE (POUR BTL) OPTIME
TOPICAL | Status: DC | PRN
  Administered 2019-05-16: 1000 mL

## 2019-05-16 MED ORDER — HEPARIN (PORCINE) 25000 UT/250ML-% IV SOLN
2350.0000 [IU]/h | INTRAVENOUS | Status: DC
  Administered 2019-05-16: 2500 [IU]/h via INTRAVENOUS
  Administered 2019-05-17: 2450 [IU]/h via INTRAVENOUS
  Administered 2019-05-17: 01:00:00 2400 [IU]/h via INTRAVENOUS
  Administered 2019-05-18: 03:00:00 2450 [IU]/h via INTRAVENOUS
  Filled 2019-05-16 (×4): qty 250

## 2019-05-16 MED ORDER — LIDOCAINE 2% (20 MG/ML) 5 ML SYRINGE
INTRAMUSCULAR | Status: DC | PRN
  Administered 2019-05-16: 60 mg via INTRAVENOUS

## 2019-05-16 MED ORDER — FENTANYL CITRATE (PF) 100 MCG/2ML IJ SOLN
INTRAMUSCULAR | Status: DC | PRN
  Administered 2019-05-16 (×5): 50 ug via INTRAVENOUS

## 2019-05-16 MED ORDER — MEPERIDINE HCL 25 MG/ML IJ SOLN: 6.2500 mg | INTRAMUSCULAR | Status: DC | PRN

## 2019-05-16 MED ORDER — STERILE WATER FOR IRRIGATION IR SOLN
Status: DC | PRN
  Administered 2019-05-16: 1000 mL

## 2019-05-16 MED ORDER — PHENYLEPHRINE 40 MCG/ML (10ML) SYRINGE FOR IV PUSH (FOR BLOOD PRESSURE SUPPORT)
PREFILLED_SYRINGE | INTRAVENOUS | Status: DC | PRN
  Administered 2019-05-16: 320 ug via INTRAVENOUS

## 2019-05-16 MED ORDER — METOPROLOL TARTRATE 25 MG/10 ML ORAL SUSPENSION
12.5000 mg | Freq: Two times a day (BID) | ORAL | Status: DC
  Administered 2019-05-16 – 2019-05-20 (×9): 12.5 mg via ORAL
  Filled 2019-05-16 (×10): qty 5

## 2019-05-16 MED ORDER — PROMETHAZINE HCL 25 MG/ML IJ SOLN: 6.2500 mg | INTRAMUSCULAR | Status: DC | PRN

## 2019-05-16 MED ORDER — ACETAMINOPHEN 325 MG PO TABS: 325.0000 mg | ORAL_TABLET | Freq: Once | ORAL | Status: DC | PRN

## 2019-05-16 MED ORDER — SODIUM CHLORIDE 0.9 % IV SOLN
2.0000 g | INTRAVENOUS | Status: AC
  Administered 2019-05-16: 2 g via INTRAVENOUS
  Filled 2019-05-16: qty 2

## 2019-05-16 MED ORDER — ACETAMINOPHEN 160 MG/5ML PO SOLN: 325.0000 mg | Freq: Once | ORAL | Status: DC | PRN

## 2019-05-16 MED ORDER — PROPOFOL 10 MG/ML IV BOLUS
INTRAVENOUS | Status: AC
  Filled 2019-05-16: qty 20

## 2019-05-16 MED ORDER — SUCCINYLCHOLINE CHLORIDE 200 MG/10ML IV SOSY
PREFILLED_SYRINGE | INTRAVENOUS | Status: DC | PRN
  Administered 2019-05-16: 120 mg via INTRAVENOUS

## 2019-05-16 MED ORDER — ROCURONIUM BROMIDE 10 MG/ML (PF) SYRINGE
PREFILLED_SYRINGE | INTRAVENOUS | Status: DC | PRN
  Administered 2019-05-16: 50 mg via INTRAVENOUS
  Administered 2019-05-16: 10 mg via INTRAVENOUS
  Administered 2019-05-16: 30 mg via INTRAVENOUS
  Administered 2019-05-16: 10 mg via INTRAVENOUS
  Administered 2019-05-16: 20 mg via INTRAVENOUS
  Administered 2019-05-16: 50 mg via INTRAVENOUS

## 2019-05-16 MED ORDER — PHENYLEPHRINE HCL-NACL 10-0.9 MG/250ML-% IV SOLN
INTRAVENOUS | Status: DC | PRN
  Administered 2019-05-16: 75 ug/min via INTRAVENOUS

## 2019-05-16 MED ORDER — LIDOCAINE 2% (20 MG/ML) 5 ML SYRINGE
INTRAMUSCULAR | Status: AC
  Filled 2019-05-16: qty 5

## 2019-05-16 MED ORDER — LACTATED RINGERS IV SOLN
INTRAVENOUS | Status: DC
  Administered 2019-05-16 (×2): via INTRAVENOUS

## 2019-05-16 MED ORDER — MIDAZOLAM HCL 2 MG/2ML IJ SOLN
INTRAMUSCULAR | Status: DC | PRN
  Administered 2019-05-16: 1 mg via INTRAVENOUS

## 2019-05-16 MED ORDER — ROCURONIUM BROMIDE 10 MG/ML (PF) SYRINGE
PREFILLED_SYRINGE | INTRAVENOUS | Status: AC
  Filled 2019-05-16: qty 10

## 2019-05-16 MED ORDER — MIDAZOLAM HCL 2 MG/2ML IJ SOLN
INTRAMUSCULAR | Status: AC
  Filled 2019-05-16: qty 2

## 2019-05-16 MED ORDER — ONDANSETRON HCL 4 MG/2ML IJ SOLN
INTRAMUSCULAR | Status: DC | PRN
  Administered 2019-05-16: 4 mg via INTRAVENOUS

## 2019-05-16 SURGICAL SUPPLY — 55 items
BAG URINE DRAINAGE (UROLOGICAL SUPPLIES) IMPLANT
BENZOIN TINCTURE PRP APPL 2/3 (GAUZE/BANDAGES/DRESSINGS) ×3 IMPLANT
BINDER ABDOMINAL 12 ML 46-62 (SOFTGOODS) IMPLANT
BIOPATCH RED 1 DISK 7.0 (GAUZE/BANDAGES/DRESSINGS) ×2 IMPLANT
BIOPATCH RED 1IN DISK 7.0MM (GAUZE/BANDAGES/DRESSINGS) ×1
BLADE CLIPPER SURG (BLADE) IMPLANT
CANISTER SUCT 3000ML PPV (MISCELLANEOUS) IMPLANT
CATH DRAINAGE MALECOT 26FR (CATHETERS) IMPLANT
CATH MALECOT (CATHETERS)
CATH MALECOT BARD  24FR (CATHETERS)
CATH MALECOT BARD 24FR (CATHETERS) IMPLANT
CATH ROBINSON RED A/P 16FR (CATHETERS) ×3 IMPLANT
CHLORAPREP W/TINT 26 (MISCELLANEOUS) ×3 IMPLANT
COVER SURGICAL LIGHT HANDLE (MISCELLANEOUS) ×3 IMPLANT
COVER WAND RF STERILE (DRAPES) IMPLANT
DEFOGGER SCOPE WARMER CLEARIFY (MISCELLANEOUS) IMPLANT
DERMABOND ADVANCED (GAUZE/BANDAGES/DRESSINGS) ×2
DERMABOND ADVANCED .7 DNX12 (GAUZE/BANDAGES/DRESSINGS) ×1 IMPLANT
DRAPE LAPAROSCOPIC ABDOMINAL (DRAPES) ×3 IMPLANT
ELECT REM PT RETURN 9FT ADLT (ELECTROSURGICAL) ×3
ELECTRODE REM PT RTRN 9FT ADLT (ELECTROSURGICAL) ×1 IMPLANT
GAUZE SPONGE 2X2 8PLY STRL LF (GAUZE/BANDAGES/DRESSINGS) ×1 IMPLANT
GAUZE SPONGE 4X4 12PLY STRL (GAUZE/BANDAGES/DRESSINGS) ×3 IMPLANT
GLOVE BIO SURGEON STRL SZ7.5 (GLOVE) ×3 IMPLANT
GOWN STRL REUS W/ TWL LRG LVL3 (GOWN DISPOSABLE) ×2 IMPLANT
GOWN STRL REUS W/ TWL XL LVL3 (GOWN DISPOSABLE) ×1 IMPLANT
GOWN STRL REUS W/TWL LRG LVL3 (GOWN DISPOSABLE) ×4
GOWN STRL REUS W/TWL XL LVL3 (GOWN DISPOSABLE) ×2
KIT BASIN OR (CUSTOM PROCEDURE TRAY) ×3 IMPLANT
KIT TURNOVER KIT B (KITS) ×3 IMPLANT
LEGGING LITHOTOMY PAIR STRL (DRAPES) IMPLANT
NEEDLE INSUFFLATION 14GA 120MM (NEEDLE) ×3 IMPLANT
NS IRRIG 1000ML POUR BTL (IV SOLUTION) ×3 IMPLANT
PAD ARMBOARD 7.5X6 YLW CONV (MISCELLANEOUS) ×6 IMPLANT
PENCIL BUTTON HOLSTER BLD 10FT (ELECTRODE) ×3 IMPLANT
PLUG CATH AND CAP STER (CATHETERS) ×3 IMPLANT
SCISSORS LAP 5X35 DISP (ENDOMECHANICALS) ×3 IMPLANT
SET IRRIG TUBING LAPAROSCOPIC (IRRIGATION / IRRIGATOR) IMPLANT
SET TUBE SMOKE EVAC HIGH FLOW (TUBING) ×3 IMPLANT
SLEEVE ENDOPATH XCEL 5M (ENDOMECHANICALS) ×6 IMPLANT
SPONGE GAUZE 2X2 STER 10/PKG (GAUZE/BANDAGES/DRESSINGS) ×2
SUT ETHILON 2 0 FS 18 (SUTURE) ×3 IMPLANT
SUT MNCRL AB 3-0 PS2 18 (SUTURE) ×3 IMPLANT
SUT PROLENE 0 SH 30 (SUTURE) ×3 IMPLANT
SUT SILK 2 0 SH (SUTURE) ×12 IMPLANT
SUT VIC AB 0 CT1 36 (SUTURE) ×3 IMPLANT
SYRINGE TOOMEY DISP (SYRINGE) ×3 IMPLANT
TOWEL GREEN STERILE (TOWEL DISPOSABLE) IMPLANT
TOWEL GREEN STERILE FF (TOWEL DISPOSABLE) ×3 IMPLANT
TRAY LAPAROSCOPIC MC (CUSTOM PROCEDURE TRAY) ×3 IMPLANT
TROCAR BLADELESS 11MM (ENDOMECHANICALS) ×3 IMPLANT
TROCAR XCEL NON-BLD 11X100MML (ENDOMECHANICALS) ×3 IMPLANT
TROCAR XCEL NON-BLD 5MMX100MML (ENDOMECHANICALS) ×3 IMPLANT
TUBE MOSS GAS 18FR (TUBING) IMPLANT
WATER STERILE IRR 1000ML POUR (IV SOLUTION) ×3 IMPLANT

## 2019-05-16 NOTE — Op Note (Signed)
05/16/2019  10:45 AM  PATIENT:  Kyle [name]  63 y.o. male  PRE-OPERATIVE DIAGNOSIS:  STROKE, DYPHAGIA  POST-OPERATIVE DIAGNOSIS:  STROKE, DYPHAGIA  PROCEDURE:  Procedure(s): LAPAROSCOPIC JEJUNOSTOMY TUBE (N/A) LAPAROSCOPIC ADHESIOLYSIS X 69min  SURGEON:  Surgeon(s) and Role:    Ralene Ok, MD - Primary  PHYSICIAN ASSISTANT: Will Jenning, PA-C  ANESTHESIA:   local and general  EBL:  5 mL   BLOOD ADMINISTERED:none  DRAINS: none   LOCAL MEDICATIONS USED:  BUPIVICAINE   SPECIMEN:  No Specimen  DISPOSITION OF SPECIMEN:  N/A  COUNTS:  YES  TOURNIQUET:  * No tourniquets in log *  DICTATION: .Dragon Dictation Findings: A 16 French red rubber catheter was placed into the proximal jejunum.  This was sutured up to the anterior abdominal wall using 2 oh silks.  This flushed easily.  Details of the procedure: After the patient was consented he was taken back to the operating room and placed in the supine position with bilateral SCDs in place.  Patient was prepped and draped in sterile fashion.  A timeout was called and all facts verified.  A Veress needle technique was used to insufflate the abdomen to 15 mmHg in the right subcostal margin.  Subsequent to this a 5 mm trocar and camera placed intra-abdominally.  There was no injury to any intra-abdominal organs.  There was a fair amount of adhesions to the midline neck consistent of both transverse colon and small bowel.  Another 5 mm trocar was placed in the right lower quadrant under direct visualization as well as a 5 mm infraumbilical port in the midline.  At this time the patient was positioned.  The omental adhesions were taken down from the anterior abdominal wall with blunt and cautery dissection.  This was to help with hemostasis.  An area of the transverse colon was adherent to the anterior abdominal wall.  This was taken down carefully.  I worked my way up to the left upper quadrant to try to visualize the  stomach.  However there was large amount of omentum within the subhepatic space.  In view the most recent abdominal x-ray.  That is feeding tube within the hiatus.  There is a good possibility that due to anatomy and this would not be appropriate for a G-tube.  I proceeded with placing a jejunostomy tube.  The right lower quadrant trocar was upsized to a 11 mm trocar.  At this time the patient was positioned in the Trendelenburg position.  The transverse colon was retracted cephalad.  The small bowel was then run in a proximal direction to ligament of Treitz was found.  At this time approximately 10 to 15 cm from the ligament of Treitz and area was chosen of the jejunum for the G-tube placement.  A 2-0 silk was used in a pursestring fashion and placed into the jejunum.  A 16 French red rubber catheter was then placed to the abdominal wall using a tonsil.  A hook cautery was used to create a enterotomy into the duodenum.  The red rubber catheter was fed into the jejunum.  The pursestring suture was then tied around the red rubber catheter.  At this time the jejunum was then brought up to the anterior abdominal wall and sutured using 2 oh silks x4.  The red rubber catheter was flushed easily with sterile saline.  At this time the 11 mm trocar was then reapproximated using a 0 Vicryl in a PMI suture passer.  Insufflation was  evacuated.  The trochars were removed.  The skin was reapproximated 4-0 Monocryl subcuticular fashion.  The patient tolerated procedure well was taken to the recovery room in stable condition.  PLAN OF CARE: Admit to inpatient   PATIENT DISPOSITION:  PACU - hemodynamically stable.   Delay start of Pharmacological VTE agent (>24hrs) due to surgical blood loss or risk of bleeding: no

## 2019-05-16 NOTE — Progress Notes (Signed)
Nutrition Follow-up   RD working remotely.  DOCUMENTATION CODES:   Morbid obesity  INTERVENTION:  Plans to initiate tube feeding via J-tube tomorrow at trickle rate of 10 ml/hr using Jevity 1.2 formula.   Once able to advance past trickle rate, recommend increasing by 10 ml every 4 hours to goal rate of 75 ml/hr.   Provide 30 ml Prostat TID per tube.   Free water flushes of 100 ml every 6 hours per tube.   Tube feeding at goal rate of provide 2460 kcal, 145 grams of protein, and 1858 ml free water.  NUTRITION DIAGNOSIS:   Inadequate oral intake related to dysphagia as evidenced by NPO status; ongoing  GOAL:   Patient will meet greater than or equal to 90% of their needs; progressing  MONITOR:   TF tolerance, Skin, Weight trends, Labs, I & O's  REASON FOR ASSESSMENT:   Consult Enteral/tube feeding initiation and management  ASSESSMENT:   63 yo male admitted with R MCA infarct due to R MCA occlusion. S/P revascularization. PMH includes A fib, GERD, HTN, PVD (venous insufficiency), CVA, sleep apnea. H/o failed gastroplasty in 1992, modified sleeve gastrectomy/ 3 hours of lysis of adhesionsby Dr. Hassell Done in 2013.  Pt with complicated abdominal surgical history. Pt underwent diagnostic laparoscopy for permanent feeding tube placement. Per surgery MD, pt abdominal anatomy not appropriate for G-tube. Jejunostomy tube placed today. Plans to initiated trickle feeds via J-tube tomorrow. Tube feeding recommendations started above once pt able to advance past trickle feeds. RD to continue to monitor. Labs and medications reviewed.   Diet Order:   Diet Order            Diet NPO time specified  Diet effective now              EDUCATION NEEDS:   Not appropriate for education at this time  Skin:  Skin Assessment: Skin Integrity Issues: Skin Integrity Issues:: Incisions Incisions: abdomen  Last BM:  11/29  Height:   Ht Readings from Last 1 Encounters:  05/15/19 6\' 3"   (1.905 m)    Weight:   Wt Readings from Last 1 Encounters:  05/15/19 (!) 154 kg    Ideal Body Weight:  89.1 kg  BMI:  Body mass index is 42.44 kg/m.  Estimated Nutritional Needs:   Kcal:  [id number]  Protein:  140-160 gm  Fluid:  >/= 2 L/day    Corrin Parker, MS, RD, LDN Pager # [phone] After hours/ weekend pager # [phone]

## 2019-05-16 NOTE — Anesthesia Preprocedure Evaluation (Addendum)
Anesthesia Evaluation  Patient identified by MRN, date of birth, ID band Patient awake    Reviewed: Allergy & Precautions, NPO status , Patient's Chart, lab work & pertinent test results  Airway Mallampati: II  TM Distance: >3 FB Neck ROM: Full    Dental  (+) Edentulous Upper, Edentulous Lower   Pulmonary sleep apnea , COPD,  COPD inhaler, Current Smoker,    breath sounds clear to auscultation       Cardiovascular hypertension, Pt. on medications and Pt. on home beta blockers + Peripheral Vascular Disease  + dysrhythmias Atrial Fibrillation  Rhythm:Irregular Rate:Normal     Neuro/Psych PSYCHIATRIC DISORDERS CVA    GI/Hepatic Neg liver ROS, GERD  Medicated,  Endo/Other  negative endocrine ROS  Renal/GU negative Renal ROS     Musculoskeletal  (+) Arthritis ,   Abdominal (+) + obese,   Peds  Hematology negative hematology ROS (+)   Anesthesia Other Findings   Reproductive/Obstetrics                            Lab Results  Component Value Date   WBC 7.4 05/16/2019   HGB 14.2 05/16/2019   HCT 44.6 05/16/2019   MCV 85.4 05/16/2019   PLT 263 05/16/2019   Lab Results  Component Value Date   CREATININE 0.67 05/16/2019   BUN 15 05/16/2019   NA 137 05/16/2019   K 4.1 05/16/2019   CL 99 05/16/2019   CO2 28 05/16/2019   Lab Results  Component Value Date   INR 1.1 04/30/2019   INR 1.3 (H) 01/22/2019   INR 1.3 (H) 01/21/2019     Anesthesia Physical Anesthesia Plan  ASA: III  Anesthesia Plan: General   Post-op Pain Management:    Induction: Intravenous  PONV Risk Score and Plan: 2 and Ondansetron and Midazolam  Airway Management Planned: Oral ETT  Additional Equipment: None  Intra-op Plan:   Post-operative Plan: Extubation in OR  Informed Consent:   Plan Discussed with: CRNA  Anesthesia Plan Comments:         Anesthesia Quick Evaluation

## 2019-05-16 NOTE — Progress Notes (Signed)
ANTICOAGULATION CONSULT NOTE - Follow Up Consult  Pharmacy Consult for Heparin Indication: atrial fibrillation  Allergies  Allergen Reactions  . Vioxx [Rofecoxib] Rash    Patient Measurements: Height: 6\' 3"  (190.5 cm) Weight: (!) 339 lb 8.1 oz (154 kg) IBW/kg (Calculated) : 84.5 Heparin Dosing Weight:  120.1 kg  Vital Signs: Temp: 97.2 F (36.2 C) (12/01 1238) Temp Source: Oral (12/01 1238) BP: 130/69 (12/01 1238) Pulse Rate: 94 (12/01 1238)  Labs: Recent Labs    05/14/19 0714 05/15/19 0401 05/15/19 1554 05/16/19 0413  HGB 13.7 14.0  --  14.2  HCT 42.7 42.2  --  44.6  PLT 248 234  --  263  HEPARINUNFRC 0.42 0.26* 0.43 0.37  CREATININE 0.65 0.62  --  0.67    Estimated Creatinine Clearance: 154 mL/min (by C-G formula based on SCr of 0.67 mg/dL).   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 Hx of Afib with stroke likely d/t not being on anticoagulation. Heparin had been on hold d/t penile bleeding, now resolved and stroke team resumed 11/26. Heparin held again 12/1 AM for PEG placement. Pharmacy consulted to resume heparin at 1300 today - will resume at previous therapeutic rate of 2500 units/hr, targeting lower goal for acute CVA CBC stable. No active bleed issues documented.  Goal of Therapy:  Heparin level 0.3-0.5 Monitor platelets by anticoagulation protocol: Yes   Plan:  Resume heparin at previous therapeutic rate of 2500 units/hr at 1300 6h heparin level from resumption Monitor daily heparin level and CBC, s/sx bleeding   Elicia Lamp, PharmD, BCPS Clinical Pharmacist Please check AMION for all [place] contact numbers 05/16/2019 1:03 PM

## 2019-05-16 NOTE — Plan of Care (Signed)
°  Problem: Coping: °Goal: Level of anxiety will decrease °Outcome: Progressing °  °

## 2019-05-16 NOTE — Anesthesia Postprocedure Evaluation (Signed)
Anesthesia Post Note  Patient: Kyle [name]  Procedure(s) Performed: LAPAROSCOPIC JEJUNOSTOMY TUBE (N/A )     Patient location during evaluation: PACU Anesthesia Type: General Level of consciousness: awake and alert Pain management: pain level controlled Vital Signs Assessment: post-procedure vital signs reviewed and stable Respiratory status: spontaneous breathing, nonlabored ventilation, respiratory function stable and patient connected to nasal cannula oxygen Cardiovascular status: stable and blood pressure returned to baseline Postop Assessment: no apparent nausea or vomiting Anesthetic complications: no    Last Vitals:  Vitals:   05/16/19 1200 05/16/19 1238  BP: 139/76 130/69  Pulse: 79 94  Resp: 12 19  Temp:  (!) 36.2 C  SpO2: 100% 93%    Last Pain:  Vitals:   05/16/19 1238  TempSrc: Oral  PainSc:                  Effie Berkshire

## 2019-05-16 NOTE — Transfer of Care (Signed)
Immediate Anesthesia Transfer of Care Note  Patient: Kyle [name]  Procedure(s) Performed: LAPAROSCOPIC JEJUNOSTOMY TUBE (N/A )  Patient Location: PACU  Anesthesia Type:General  Level of Consciousness: awake, alert , oriented and patient cooperative  Airway & Oxygen Therapy: Patient Spontanous Breathing and Patient connected to nasal cannula oxygen  Post-op Assessment: Report given to RN, Post -op Vital signs reviewed and stable and Patient moving all extremities  Post vital signs: Reviewed and stable  Last Vitals:  Vitals Value Taken Time  BP 157/130 05/16/19 1117  Temp    Pulse 93 05/16/19 1129  Resp 9 05/16/19 1129  SpO2 100 % 05/16/19 1129  Vitals shown include unvalidated device data.  Last Pain:  Vitals:   05/16/19 0801  TempSrc:   PainSc: 10-Worst pain ever      Patients Stated Pain Goal: 2 ([id number])  Complications: No apparent anesthesia complications

## 2019-05-16 NOTE — Progress Notes (Signed)
OT Cancellation Note  Patient Details Name: Kyle [name] MRN: [place]:[id number] DOB: 1956/02/27   Cancelled Treatment:    Reason Eval/Treat Not Completed: Patient declined, no reason specified Pt declined session this date and pt had Jtube placement earlier this date. Will continue to follow and return as time allows and pt is appropriate.   Dorinda Hill OTR/L Acute Rehabilitation Services Office: [place] 05/16/2019, 3:43 PM

## 2019-05-16 NOTE — Progress Notes (Signed)
STROKE TEAM PROGRESS NOTE   INTERVAL HISTORY Pt RN at bedside. He is back from OR s/p J tube placement. Awake alert and interactive. Will change medication to liquid form to J tube. Will start trickle feeds tomorrow.    Vitals:   05/16/19 1130 05/16/19 1145 05/16/19 1200 05/16/19 1238  BP: 131/77 122/88 139/76 130/69  Pulse: 68 84 79 94  Resp: 12 16 12 19   Temp:    (!) 97.2 F (36.2 C)  TempSrc:    Oral  SpO2: 100% 100% 100% 93%  Weight:      Height:        CBC:  Recent Labs  Lab 05/15/19 0401 05/16/19 0413  WBC 7.4 7.4  HGB 14.0 14.2  HCT 42.2 44.6  MCV 84.4 85.4  PLT 234 [id number]   Basic Metabolic Panel:  Recent Labs  Lab 05/15/19 0401 05/16/19 0413  NA 135 137  K 4.1 4.1  CL 99 99  CO2 27 28  GLUCOSE 104* 96  BUN 13 15  CREATININE 0.62 0.67  CALCIUM 9.0 9.0   Lipid Panel:     Component Value Date/Time   CHOL 102 05/01/2019 0630   CHOL 142 12/19/2014 0804   TRIG 98 05/01/2019 0630   HDL 25 (L) 05/01/2019 0630   HDL 29 (L) 12/19/2014 0804   CHOLHDL 4.1 05/01/2019 0630   VLDL 20 05/01/2019 0630   LDLCALC 57 05/01/2019 0630   LDLCALC 93 12/19/2014 0804   HgbA1c:  Lab Results  Component Value Date   HGBA1C 5.5 05/01/2019   Urine Drug Screen:     Component Value Date/Time   LABOPIA NONE DETECTED 04/30/2019 1507   COCAINSCRNUR NONE DETECTED 04/30/2019 1507   COCAINSCRNUR Negative 03/15/2015 1141   LABBENZ NONE DETECTED 04/30/2019 1507   AMPHETMU POSITIVE (A) 04/30/2019 1507   THCU NONE DETECTED 04/30/2019 1507   LABBARB NONE DETECTED 04/30/2019 1507    Alcohol Level     Component Value Date/Time   ETH <10 04/30/2019 1152    IMAGING past 24h No results found.  PHYSICAL EXAM  General - morbidly obese, well developed, not in acute distress.  Ophthalmologic - fundi not visualized due to noncooperation.  Cardiovascular - irregularly irregular heart rate and rhythm.  Neuro - awake alert, still anarthric, not able to answer orientation  questions but able to write them out on a note bad, he is fully oriented. Able to repeat "I am here", very dysarthric but not able to repeat other sentences. Follows all simple commands PERRL, EOMI, blinking to visual threat bilaterally. Bilateral facial weakness but left seems more than right. Tongue protrusion difficulty. RUE 5/5, LUE 4+/5 proximal and 4/5 finger grip. RLE 5/5. LLE 4+/5 proximal and 5-/5 distal. DTR 1+ and no babinski. Sensation symmetrical. Bilateral FTN intact. Gait not tested.    ASSESSMENT/PLAN Mr. ARVILL LIBENGOOD is a 63 y.o. male with history of a. Fib ( not anticoagulated) sick sinus syndrom, HTN, CVA, OSA ( not on CPAP) presenting with right side gaze and left arm paralysis.   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 Probably hyperdense R MCA bifurcation. Old R temporal lobe and L frontal lobe infarcts. .     CTA head & neck suboptimal. Occlusion R MCA at bifurcation.  CT perfusion large delayed perfusion R MCA. No core infarct.   Cerebral angio occluded  R M1, TICI2c   MRI  R MCA infarct (operculum). B MCA encephalomalacia  MRA  Improved R MCA patency  2D Echo EF 60-65%. No source of embolus   LDL 57   HgbA1c 5.5   HIV neg  Lovenox 40 mg sq daily for VTE prophylaxis  aspirin 81 mg daily prior to admission, off IV heparin d/t bleeding from penis, but resumed 11/26. Back on heparin post J tube placement - if no crushed pills then have think about switch heparin to lovenox on discharge  Therapy recommendations:  CIR->family prefer SNF  Disposition:  Pending.  Bed obtained. Insurance pending.   repeat COVID test for d/c ordered today   Atrial Fibrillation / SSS  Home anticoagulation:  not taking   Started on Xarelto 09/15/2018, documented not taking 04/23/2019 (family practice note)  off IV heparin d/t bleeding from penis,  resumed 11/26. Back on heparin post J tube placement - if no crushed pills then have think about switch heparin to lovenox on discharge  Bradycardia with pauses (2 seconds) - Dr Rory Percy saw 11/20 - pt asymptomatic - currently rate in 70's - decreased metoprolol dose.   Hypertension  Home meds:  Lisinopril 20 . Treated w/ cleviprex, now off. Labetalol prn  . BP goal < 180 . BP stable now    . On metoprolol 12.5 bid . Long-term BP goal normotensive  Hyperlipidemia  Home meds:  Prescribed lipitor - ? taking  LDL 57, goal < 70  On lipitor 40 -> off lipitor due to no liquid form of statin  Dysphagia . Secondary to stroke . NPO . Speech on board . J tube placed by surgery 12/1 . To start TF 12/2 trickle rate to goal of 75/hr w/ free water and prostat . No crushed meds should not be given via J tube  Tobacco abuse  Current smoker  Smoking cessation counseling provided  Pt is willing to quit   Other Stroke Risk Factors  ETOH use, alcohol level <10, advised to drink no more than 2 drink(s) a day  Substance abuse - UDS positive for Amphetamines   Morbid Obesity, Body mass index is 42.44 kg/m., hx failed open gastroplasty in 1993, recommend weight loss, diet and exercise as appropriate   Hx stroke/TIA by imaging - Old R temporal lobe and L frontal lobe infarcts - likely due to AFib  Obstructive sleep apnea, not on CPAP at home  PVD   Other Active Problems  Leukocytosis, resolved.   COPD  Hypokalemia, resolved  Chronic pain syndrome, chronic B LBP w/ R sided sciatica, Pain from recent fall on narcotics at home. Followed [facility] [place]  Adjustment d/o w/ mixed anxiety and depressed mood  LBP on oxycodone PRN  Hospital day # 16  [name], MD PhD Stroke Neurology 05/16/2019 2:12 PM  To contact Stroke Continuity provider, please refer to [email or web address] After hours, contact General Neurology

## 2019-05-16 NOTE — Progress Notes (Signed)
ANTICOAGULATION CONSULT NOTE - Follow Up Consult  Pharmacy Consult for Heparin Indication: atrial fibrillation  Allergies  Allergen Reactions  . Vioxx [Rofecoxib] Rash    Patient Measurements: Height: 6\' 3"  (190.5 cm) Weight: (!) 339 lb 8.1 oz (154 kg) IBW/kg (Calculated) : 84.5 Heparin Dosing Weight:  120.1 kg  Vital Signs: Temp: 98.5 F (36.9 C) (12/01 2310) Temp Source: Oral (12/01 2310) BP: 146/86 (12/01 2310) Pulse Rate: 75 (12/01 2310)  Labs: Recent Labs    05/14/19 0714 05/15/19 0401 05/15/19 1554 05/16/19 0413 05/16/19 2237  HGB 13.7 14.0  --  14.2  --   HCT 42.7 42.2  --  44.6  --   PLT 248 234  --  263  --   HEPARINUNFRC 0.42 0.26* 0.43 0.37 0.56  CREATININE 0.65 0.62  --  0.67  --     Estimated Creatinine Clearance: 154 mL/min (by C-G formula based on SCr of 0.67 mg/dL).   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 Hx of Afib with stroke likely d/t not being on anticoagulation. Heparin had been on hold d/t penile bleeding, now resolved and stroke team resumed 11/26. Heparin held again 12/1 AM for PEG placement. Pharmacy consulted to resume heparin post PEG.  Heparin level slightly supratherapeutic (0.56) on gtt at 2500 units/hr. No issues with line or bleeding reported per RN.  Goal of Therapy:  Heparin level 0.3-0.5 units/ml Monitor platelets by anticoagulation protocol: Yes   Plan:  Decrease heparin to 2400 units/hr F/u 6 hr heparin level Monitor daily heparin level and CBC, s/sx bleeding  Sherlon Handing, PharmD, BCPS Please see amion for complete clinical pharmacist phone list 05/16/2019 11:24 PM

## 2019-05-16 NOTE — Anesthesia Procedure Notes (Addendum)
Procedure Name: Intubation Date/Time: 05/16/2019 9:22 AM Performed by: Leonor Liv, CRNA Pre-anesthesia Checklist: Patient identified, Emergency Drugs available, Suction available and Patient being monitored Patient Re-evaluated:Patient Re-evaluated prior to induction Oxygen Delivery Method: Circle System Utilized Preoxygenation: Pre-oxygenation with 100% oxygen Induction Type: IV induction and Rapid sequence Laryngoscope Size: Mac and 4 Grade View: Grade I Tube type: Oral Tube size: 7.5 mm Number of attempts: 1 Airway Equipment and Method: Stylet and Oral airway Placement Confirmation: ETT inserted through vocal cords under direct vision,  positive ETCO2 and breath sounds checked- equal and bilateral Secured at: 23 cm Tube secured with: Tape Dental Injury: Teeth and Oropharynx as per pre-operative assessment

## 2019-05-17 ENCOUNTER — Encounter (HOSPITAL_COMMUNITY): Payer: Self-pay | Admitting: General Surgery

## 2019-05-17 LAB — GLUCOSE, CAPILLARY
Glucose-Capillary: 107 mg/dL — ABNORMAL HIGH (ref 70–99)
Glucose-Capillary: 78 mg/dL (ref 70–99)
Glucose-Capillary: 87 mg/dL (ref 70–99)
Glucose-Capillary: 89 mg/dL (ref 70–99)
Glucose-Capillary: 91 mg/dL (ref 70–99)
Glucose-Capillary: 97 mg/dL (ref 70–99)

## 2019-05-17 LAB — CBC
HCT: 46.2 % (ref 39.0–52.0)
Hemoglobin: 15 g/dL (ref 13.0–17.0)
MCH: 27.4 pg (ref 26.0–34.0)
MCHC: 32.5 g/dL (ref 30.0–36.0)
MCV: 84.5 fL (ref 80.0–100.0)
Platelets: 257 10*3/uL (ref 150–400)
RBC: 5.47 MIL/uL (ref 4.22–5.81)
RDW: 15.4 % (ref 11.5–15.5)
WBC: 9.7 10*3/uL (ref 4.0–10.5)
nRBC: 0 % (ref 0.0–0.2)

## 2019-05-17 LAB — BASIC METABOLIC PANEL
Anion gap: 10 (ref 5–15)
BUN: 13 mg/dL (ref 8–23)
CO2: 29 mmol/L (ref 22–32)
Calcium: 9.4 mg/dL (ref 8.9–10.3)
Chloride: 95 mmol/L — ABNORMAL LOW (ref 98–111)
Creatinine, Ser: 0.7 mg/dL (ref 0.61–1.24)
GFR calc Af Amer: >60 mL/min (ref >60)
GFR calc non Af Amer: >60 mL/min (ref >60)
Glucose, Bld: 98 mg/dL (ref 70–99)
Potassium: 4.1 mmol/L (ref 3.5–5.1)
Sodium: 134 mmol/L — ABNORMAL LOW (ref 135–145)

## 2019-05-17 LAB — HEPARIN LEVEL (UNFRACTIONATED)
Heparin Unfractionated: 0.46 IU/mL (ref 0.30–0.70)
Heparin Unfractionated: 0.28 IU/mL — ABNORMAL LOW (ref 0.30–0.70)

## 2019-05-17 MED ORDER — JEVITY 1.2 CAL PO LIQD
1000.0000 mL | ORAL | Status: DC
  Administered 2019-05-17: 14:00:00 10 mL/h
  Filled 2019-05-17 (×2): qty 1000

## 2019-05-17 NOTE — Progress Notes (Signed)
Bariatric bed not working, new bed delivered, patient was transferred to lift and beds changed out.  Bath given at that time, skin assessed.

## 2019-05-17 NOTE — Progress Notes (Signed)
ANTICOAGULATION CONSULT NOTE - Follow Up Consult  Pharmacy Consult for Heparin Indication: atrial fibrillation  Allergies  Allergen Reactions  . Vioxx [Rofecoxib] Rash    Patient Measurements: Height: 6\' 3"  (190.5 cm) Weight: (!) 339 lb 8.1 oz (154 kg) IBW/kg (Calculated) : 84.5 Heparin Dosing Weight:  120.1 kg  Vital Signs: Temp: 98.5 F (36.9 C) (12/02 0819) Temp Source: Oral (12/02 0819) BP: 133/70 (12/02 0819) Pulse Rate: 75 (12/01 2310)  Labs: Recent Labs    05/15/19 0401  05/16/19 0413 05/16/19 2237 05/17/19 0351  HGB 14.0  --  14.2  --  15.0  HCT 42.2  --  44.6  --  46.2  PLT 234  --  263  --  257  HEPARINUNFRC 0.26*   < > 0.37 0.56 0.46  CREATININE 0.62  --  0.67  --  0.70   < > = values in this interval not displayed.    Estimated Creatinine Clearance: 154 mL/min (by C-G formula based on SCr of 0.7 mg/dL).   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 Hx of Afib with stroke likely d/t not being on anticoagulation. Heparin had been on hold d/t penile bleeding, now resolved and stroke team resumed 11/26. Heparin held again 12/1 AM for PEG placement. Pharmacy consulted to resume heparin post PEG.  Heparin level therapeutic (0.46) on gtt at 2400 units/hr, but level drawn too close to rate change - will recheck heparin level later today. No issues with line or bleeding reported per RN.  Goal of Therapy:  Heparin level 0.3-0.5 units/ml Monitor platelets by anticoagulation protocol: Yes   Plan:  Continue heparin at 2400 units/hr F/u 6 hr heparin level Monitor daily heparin level and CBC, s/sx bleeding  Alanda Slim, PharmD, [facility] Clinical Pharmacist Please see AMION for all Pharmacists' Contact Phone Numbers 05/17/2019, 9:59 AM

## 2019-05-17 NOTE — Plan of Care (Signed)
PT/OT working with patient to help with mobilization.  Bed isn't working new bed ordered awaiting arrival to unit.  Patient reporting pain in abdomen, requesting Oxycodone for pain management.  Abd binder in place, tube feeds started at 10cc/hr

## 2019-05-17 NOTE — Progress Notes (Signed)
Nutrition Follow-up  DOCUMENTATION CODES:   Morbid obesity  INTERVENTION:  Initiate tube feeding via J-tube at trickle rate of 10 ml/hr using Jevity 1.2 formula.   Once able to advance past trickle rate, recommend increasing by 10 ml every 4 hours to goal rate of 75 ml/hr.   Provide 30 ml Prostat TID per tube.   Free water flushes of 100 ml every 6 hours per tube.   Tube feeding at goal rate of provide 2460 kcal, 145 grams of protein, and 1858 ml free water.  NUTRITION DIAGNOSIS:   Inadequate oral intake related to dysphagia as evidenced by NPO status; ongoing  GOAL:   Patient will meet greater than or equal to 90% of their needs; progressing  MONITOR:   TF tolerance, Skin, Weight trends, Labs, I & O's  REASON FOR ASSESSMENT:   Consult Enteral/tube feeding initiation and management  ASSESSMENT:   63 yo male admitted with R MCA infarct due to R MCA occlusion. S/P revascularization. PMH includes A fib, GERD, HTN, PVD (venous insufficiency), CVA, sleep apnea.  H/o failed gastroplasty in 1992, modified sleeve gastrectomy/ 3 hours of lysis of adhesionsby Dr. Hassell Done in 2013. J-tube placed 12/1.   Plans to initiate trickle tube feeds today at 10 ml/hr via J-tube per MD orders. No plans for advancement yet. Tube feeding recommendations are stated above for when pt able to advance past trickle feeds. RD to continue to monitor. Labs and medications reviewed.     Diet Order:   Diet Order            Diet NPO time specified  Diet effective now              EDUCATION NEEDS:   Not appropriate for education at this time  Skin:  Skin Assessment: Reviewed RN Assessment Skin Integrity Issues:: Incisions Incisions: abdomen  Last BM:  11/29  Height:   Ht Readings from Last 1 Encounters:  05/15/19 6\' 3"  (1.905 m)    Weight:   Wt Readings from Last 1 Encounters:  05/15/19 (!) 154 kg    Ideal Body Weight:  89.1 kg  BMI:  Body mass index is 42.44  kg/m.  Estimated Nutritional Needs:   Kcal:  [id number]  Protein:  140-160 gm  Fluid:  >/= 2 L/day    Corrin Parker, MS, RD, LDN Pager # [phone] After hours/ weekend pager # [phone]

## 2019-05-17 NOTE — Progress Notes (Signed)
[facility] Progress Note  1 Day Post-Op  Subjective: CC-  Abdomen sore from surgery. Denies bloating, nausea, vomiting. No flatus or BM.  Objective: Vital signs in last 24 hours: Temp:  [97.2 F (36.2 C)-99 F (37.2 C)] 98.5 F (36.9 C) (12/01 2310) Pulse Rate:  [68-94] 75 (12/01 2310) Resp:  [11-19] 11 (12/01 2310) BP: (122-155)/(66-109) 146/86 (12/01 2310) SpO2:  [93 %-100 %] 94 % (12/01 2310) Last BM Date: 05/14/19  Intake/Output from previous day: 12/01 0701 - 12/02 0700 In: 1565.3 [I.V.:1535.3] Out: 1380 [Urine:1375; Blood:5] Intake/Output this shift: No intake/output data recorded.  PE: Gen:  Alert, NAD, pleasant Pulm:  Rate and effort normal Abd: obese, soft, appropriately tender, few BS heard, lap incisions cdi, J tube site cdi Skin: warm and dry  Lab Results:  Recent Labs    05/16/19 0413 05/17/19 0351  WBC 7.4 9.7  HGB 14.2 15.0  HCT 44.6 46.2  PLT 263 257   BMET Recent Labs    05/16/19 0413 05/17/19 0351  NA 137 134*  K 4.1 4.1  CL 99 95*  CO2 28 29  GLUCOSE 96 98  BUN 15 13  CREATININE 0.67 0.70  CALCIUM 9.0 9.4   PT/INR No results for input(s): LABPROT, INR in the last 72 hours. CMP     Component Value Date/Time   NA 134 (L) 05/17/2019 0351   NA 139 12/19/2014 0804   K 4.1 05/17/2019 0351   CL 95 (L) 05/17/2019 0351   CO2 29 05/17/2019 0351   GLUCOSE 98 05/17/2019 0351   BUN 13 05/17/2019 0351   BUN 16 12/19/2014 0804   CREATININE 0.70 05/17/2019 0351   CREATININE 0.75 06/20/2011 1032   CALCIUM 9.4 05/17/2019 0351   PROT 7.2 04/30/2019 1152   PROT 6.9 12/19/2014 0804   ALBUMIN 3.5 04/30/2019 1152   ALBUMIN 4.2 12/19/2014 0804   AST 24 04/30/2019 1152   ALT 16 04/30/2019 1152   ALKPHOS 77 04/30/2019 1152   BILITOT 1.0 04/30/2019 1152   BILITOT 0.8 12/19/2014 0804   GFRNONAA >60 05/17/2019 0351   GFRAA >60 05/17/2019 0351   Lipase     Component Value Date/Time   LIPASE 28 04/23/2019 1734        Studies/Results: No results found.  Anti-infectives: Anti-infectives (From admission, onward)   Start     Dose/Rate Route Frequency Ordered Stop   05/16/19 0945  cefoTEtan (CEFOTAN) 2 g in sodium chloride 0.9 % 100 mL IVPB     2 g 200 mL/hr over 30 Minutes Intravenous To Surgery 05/16/19 0934 05/16/19 0938   05/09/19 0600  cefoTEtan (CEFOTAN) 2 g in sodium chloride 0.9 % 100 mL IVPB     2 g 200 mL/hr over 30 Minutes Intravenous On call to O.R. 05/08/19 1533 05/10/19 0559   04/30/19 1227  ceFAZolin (ANCEF) 2-4 GM/100ML-% IVPB    Note to Pharmacy: Margaretmary Dys   : cabinet override      04/30/19 1227 05/01/19 0029       Assessment/Plan Atrial fibrillation HTN H/o CVA Tobacco abuse OSA H/o failed gastroplasty in 1992, modified sleeve gastrectomy/ 3 hours of lysis of adhesionsby Dr. Hassell Done in 2013  Right MCAinfarcts/pTICI 2c revascularizationby IR11/15/20 Dysphagia S/p LAPAROSCOPIC JEJUNOSTOMY TUBE (N/A), LAPAROSCOPIC ADHESIOLYSIS X 29min 12/1 Dr. Rosendo Gros - POD#1 - Start trickle tube feedings, do not advance yet. Continue therapies, mobilize.  ID - none currently FEN - NPO, trickle TF VTE - SCDs, heparin gtt Foley - none   LOS:  17 days    [facility] 05/17/2019, 8:09 AM Please see Amion for pager number during day hours 7:00am-4:30pm

## 2019-05-17 NOTE — Progress Notes (Signed)
OT Cancellation Note  Patient Details Name: Kyle [name] MRN: [id number] DOB: January 19, 1956   Cancelled Treatment:    Reason Eval/Treat Not Completed: Other (comment)(Bed malfunction) Attempted to see pt this morning, pt requested OT/PT return following pain medication. Returned later this date, pt agreeable to work with therapy and highly motivated. Unfortunately, the bed was malfunctioning and we were unable to move the head/foot of bed to get pt out of bed. Secretary notified and called for assistance. Will continue to follow and return later as time allows.   Dorinda Hill OTR/L Acute Rehabilitation Services Office: [place] 05/17/2019, 1:28 PM

## 2019-05-17 NOTE — Progress Notes (Signed)
STROKE TEAM PROGRESS NOTE   INTERVAL HISTORY Pt lying in bed, cortrak off. Had J tube today on trickle feeding. Tolerating well. Still has some soreness at abdomen but more on the incision area. On heparin IV still.   Vitals:   05/17/19 0819 05/17/19 1130 05/17/19 1217 05/17/19 1400  BP: 133/70  124/73   Pulse:   84   Resp: 12  14   Temp: 98.5 F (36.9 C)  98.3 F (36.8 C)   TempSrc: Oral  Oral   SpO2: 95% 95% 98% 94%  Weight:      Height:        CBC:  Recent Labs  Lab 05/16/19 0413 05/17/19 0351  WBC 7.4 9.7  HGB 14.2 15.0  HCT 44.6 46.2  MCV 85.4 84.5  PLT 263 [id number]   Basic Metabolic Panel:  Recent Labs  Lab 05/16/19 0413 05/17/19 0351  NA 137 134*  K 4.1 4.1  CL 99 95*  CO2 28 29  GLUCOSE 96 98  BUN 15 13  CREATININE 0.67 0.70  CALCIUM 9.0 9.4   Lipid Panel:     Component Value Date/Time   CHOL 102 05/01/2019 0630   CHOL 142 12/19/2014 0804   TRIG 98 05/01/2019 0630   HDL 25 (L) 05/01/2019 0630   HDL 29 (L) 12/19/2014 0804   CHOLHDL 4.1 05/01/2019 0630   VLDL 20 05/01/2019 0630   LDLCALC 57 05/01/2019 0630   LDLCALC 93 12/19/2014 0804   HgbA1c:  Lab Results  Component Value Date   HGBA1C 5.5 05/01/2019   Urine Drug Screen:     Component Value Date/Time   LABOPIA NONE DETECTED 04/30/2019 1507   COCAINSCRNUR NONE DETECTED 04/30/2019 1507   COCAINSCRNUR Negative 03/15/2015 1141   LABBENZ NONE DETECTED 04/30/2019 1507   AMPHETMU POSITIVE (A) 04/30/2019 1507   THCU NONE DETECTED 04/30/2019 1507   LABBARB NONE DETECTED 04/30/2019 1507    Alcohol Level     Component Value Date/Time   ETH <10 04/30/2019 1152    IMAGING past 24h No results found.  PHYSICAL EXAM    General - morbidly obese, well developed, not in acute distress.  Ophthalmologic - fundi not visualized due to noncooperation.  Cardiovascular - irregularly irregular heart rate and rhythm.  Neuro - awake alert, still anarthric, not able to answer orientation questions but  able to write them out on a note bad, he is fully oriented. Able to repeat "I am here", very dysarthric but not able to repeat other sentences. Follows all simple commands PERRL, EOMI, blinking to visual threat bilaterally. Bilateral facial weakness but left seems more than right. Tongue protrusion difficulty. RUE 5/5, LUE 4+/5 proximal and 4/5 finger grip. RLE 5/5. LLE 4+/5 proximal and 5-/5 distal. DTR 1+ and no babinski. Sensation symmetrical. Bilateral FTN intact. Gait not tested.    ASSESSMENT/PLAN [name] is a 63 y.o. male with history of a. Fib ( not anticoagulated) sick sinus syndrom, HTN, CVA, OSA ( not on CPAP) presenting with right side gaze and left arm paralysis.   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 Probably hyperdense R MCA bifurcation. Old R temporal lobe and L frontal lobe infarcts. .     CTA head & neck suboptimal. Occlusion R MCA at bifurcation.  CT perfusion large delayed perfusion R MCA. No core infarct.  Cerebral angio occluded R M1, TICI2c   MRI  R MCA infarct (operculum). B MCA encephalomalacia  MRA  Improved R MCA patency  2D Echo EF 60-65%. No source of embolus   LDL 57   HgbA1c 5.5   HIV neg  Lovenox 40 mg sq daily for VTE prophylaxis  aspirin 81 mg daily prior to admission, off IV heparin d/t bleeding from penis, but resumed 11/26. Back on heparin post J tube placement - if no crushed pills then have to think about switch heparin to lovenox on discharge  Therapy recommendations:  CIR->family prefer SNF  Disposition:  Pending.  Bed obtained. Insurance pending.   repeat COVID test for d/c on 1/21 negative  Atrial Fibrillation / SSS  Home anticoagulation:  not taking   Started on Xarelto 09/15/2018, documented not taking 04/23/2019 (family practice note)  off IV heparin d/t bleeding from penis, resumed  11/26. Back on heparin post J tube placement - if no crushed pills then have think about switch heparin to lovenox on discharge  Bradycardia with pauses (2 seconds) - Dr Rory Percy saw 11/20 - pt asymptomatic - currently rate in 70's - decreased metoprolol dose.   Hypertension  Home meds:  Lisinopril 20 . Treated w/ cleviprex, now off. Labetalol prn  . BP goal < 180 . BP stable now    . On metoprolol 12.5 bid . Long-term BP goal normotensive  Hyperlipidemia  Home meds:  Prescribed lipitor - ? taking  LDL 57, goal < 70  On lipitor 40 -> off lipitor due to no liquid form of statin  Dysphagia . Secondary to stroke . NPO . Speech on board . J tube placed by surgery 12/1 (with lysis of adhesions) . Trickle TF started 12/2, do not advance per surgery. They are following . On IVF @ 25 with free water 100 Q6h . No crushed meds should be given via J tube  Tobacco abuse  Current smoker  Smoking cessation counseling provided  Pt is willing to quit   Other Stroke Risk Factors  ETOH use, alcohol level <10, advised to drink no more than 2 drink(s) a day  Substance abuse - UDS positive for Amphetamines   Morbid Obesity, Body mass index is 42.44 kg/m., hx failed open gastroplasty in 1993, recommend weight loss, diet and exercise as appropriate   Hx stroke/TIA by imaging - Old R temporal lobe and L frontal lobe infarcts - likely due to AFib  Obstructive sleep apnea, not on CPAP at home  PVD   Other Active Problems  Leukocytosis, resolved.   COPD  Hypokalemia, resolved  Chronic pain syndrome, chronic B LBP w/ R sided sciatica, Pain from recent fall on narcotics at home. Followed at [facility] [place]  Adjustment d/o w/ mixed anxiety and depressed mood  LBP on oxycodone PRN  Hospital day # 17  [name], MD PhD Stroke Neurology 05/17/2019 2:41 PM  To contact Stroke Continuity provider, please refer to [email or web address] After hours,  contact General Neurology

## 2019-05-17 NOTE — Care Management Important Message (Signed)
Important Message  Patient Details  Name: Kyle [name] MRN: [id number] Date of Birth: 04/22/1956   Medicare Important Message Given:  Yes     Kyle [name] 05/17/2019, 2:14 PM

## 2019-05-17 NOTE — Progress Notes (Signed)
PT Cancellation Note  Patient Details Name: Kyle [name] MRN: [place]:[id number] DOB: 1956/01/20   Cancelled Treatment:    Reason Eval/Treat Not Completed: Other (comment) Attempted to see patient this morning. However was unable to move his bed in order to get him up to recliner, controls on bed not working. Called for assistance and was unable to get any. Will see later or tomorrow as time allows.     Draysen Weygandt 05/17/2019, 1:12 PM

## 2019-05-17 NOTE — Progress Notes (Addendum)
ANTICOAGULATION CONSULT NOTE - Follow Up Consult  Pharmacy Consult for Heparin Indication: atrial fibrillation  Allergies  Allergen Reactions  . Vioxx [Rofecoxib] Rash    Patient Measurements: Height: 6\' 3"  (190.5 cm) Weight: (!) 339 lb 8.1 oz (154 kg) IBW/kg (Calculated) : 84.5 Heparin Dosing Weight:  120.1 kg  Vital Signs: Temp: 98.3 F (36.8 C) (12/02 1217) Temp Source: Oral (12/02 1217) BP: 124/73 (12/02 1217) Pulse Rate: 84 (12/02 1217)  Labs: Recent Labs    05/15/19 0401  05/16/19 0413 05/16/19 2237 05/17/19 0351 05/17/19 1126  HGB 14.0  --  14.2  --  15.0  --   HCT 42.2  --  44.6  --  46.2  --   PLT 234  --  263  --  257  --   HEPARINUNFRC 0.26*   < > 0.37 0.56 0.46 0.28*  CREATININE 0.62  --  0.67  --  0.70  --    < > = values in this interval not displayed.    Estimated Creatinine Clearance: 154 mL/min (by C-G formula based on SCr of 0.7 mg/dL).   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 Hx of Afib with stroke likely d/t not being on anticoagulation. Heparin had been on hold d/t penile bleeding, now resolved and stroke team resumed 11/26. Heparin held again 12/1 AM for PEG placement. Pharmacy consulted to resume heparin post PEG.  Heparin level slightly subtherapeutic (0.28) on gtt at 2400 units/hr. No issues with line or bleeding reported per RN.  Goal of Therapy:  Heparin level 0.3-0.5 units/ml Monitor platelets by anticoagulation protocol: Yes   Plan:  Increase heparin drip to 2450 units/hr Monitor daily heparin level and CBC, s/sx bleeding   Alanda Slim, PharmD, [facility] Clinical Pharmacist Please see AMION for all Pharmacists' Contact Phone Numbers 05/17/2019, 12:49 PM

## 2019-05-18 DIAGNOSIS — F17200 Nicotine dependence, unspecified, uncomplicated: Secondary | ICD-10-CM | POA: Diagnosis present

## 2019-05-18 DIAGNOSIS — G8929 Other chronic pain: Secondary | ICD-10-CM

## 2019-05-18 DIAGNOSIS — M5441 Lumbago with sciatica, right side: Secondary | ICD-10-CM

## 2019-05-18 DIAGNOSIS — G4733 Obstructive sleep apnea (adult) (pediatric): Secondary | ICD-10-CM

## 2019-05-18 DIAGNOSIS — I482 Chronic atrial fibrillation, unspecified: Secondary | ICD-10-CM

## 2019-05-18 DIAGNOSIS — I69391 Dysphagia following cerebral infarction: Secondary | ICD-10-CM

## 2019-05-18 DIAGNOSIS — G894 Chronic pain syndrome: Secondary | ICD-10-CM | POA: Diagnosis present

## 2019-05-18 LAB — GLUCOSE, CAPILLARY
Glucose-Capillary: 95 mg/dL (ref 70–99)
Glucose-Capillary: 92 mg/dL (ref 70–99)
Glucose-Capillary: 115 mg/dL — ABNORMAL HIGH (ref 70–99)
Glucose-Capillary: 115 mg/dL — ABNORMAL HIGH (ref 70–99)
Glucose-Capillary: 109 mg/dL — ABNORMAL HIGH (ref 70–99)
Glucose-Capillary: 105 mg/dL — ABNORMAL HIGH (ref 70–99)

## 2019-05-18 LAB — CBC
HCT: 45.3 % (ref 39.0–52.0)
Hemoglobin: 14.6 g/dL (ref 13.0–17.0)
MCH: 27.5 pg (ref 26.0–34.0)
MCHC: 32.2 g/dL (ref 30.0–36.0)
MCV: 85.3 fL (ref 80.0–100.0)
Platelets: 248 10*3/uL (ref 150–400)
RBC: 5.31 MIL/uL (ref 4.22–5.81)
RDW: 15.6 % — ABNORMAL HIGH (ref 11.5–15.5)
WBC: 8.7 10*3/uL (ref 4.0–10.5)
nRBC: 0 % (ref 0.0–0.2)

## 2019-05-18 LAB — BASIC METABOLIC PANEL
Anion gap: 12 (ref 5–15)
BUN: 14 mg/dL (ref 8–23)
CO2: 27 mmol/L (ref 22–32)
Calcium: 9.3 mg/dL (ref 8.9–10.3)
Chloride: 97 mmol/L — ABNORMAL LOW (ref 98–111)
Creatinine, Ser: 0.71 mg/dL (ref 0.61–1.24)
GFR calc Af Amer: >60 mL/min (ref >60)
GFR calc non Af Amer: >60 mL/min (ref >60)
Glucose, Bld: 94 mg/dL (ref 70–99)
Potassium: 3.9 mmol/L (ref 3.5–5.1)
Sodium: 136 mmol/L (ref 135–145)

## 2019-05-18 LAB — HEPARIN LEVEL (UNFRACTIONATED): Heparin Unfractionated: 0.74 IU/mL — ABNORMAL HIGH (ref 0.30–0.70)

## 2019-05-18 MED ORDER — JEVITY 1.2 CAL PO LIQD
1000.0000 mL | ORAL | Status: DC
  Administered 2019-05-18: 1000 mL
  Filled 2019-05-18 (×3): qty 1000

## 2019-05-18 MED ORDER — HEPARIN (PORCINE) 25000 UT/250ML-% IV SOLN
2350.0000 [IU]/h | INTRAVENOUS | Status: AC
  Administered 2019-05-18: 2350 [IU]/h via INTRAVENOUS
  Filled 2019-05-18: qty 250

## 2019-05-18 MED ORDER — JEVITY 1.2 CAL PO LIQD
1000.0000 mL | ORAL | Status: DC
  Administered 2019-05-18: 1000 mL
  Filled 2019-05-18: qty 1000

## 2019-05-18 MED ORDER — ALBUTEROL SULFATE (2.5 MG/3ML) 0.083% IN NEBU
3.0000 mL | INHALATION_SOLUTION | Freq: Three times a day (TID) | RESPIRATORY_TRACT | Status: DC
  Administered 2019-05-18 – 2019-05-24 (×17): 3 mL via RESPIRATORY_TRACT
  Filled 2019-05-18 (×18): qty 3

## 2019-05-18 MED ORDER — ENOXAPARIN SODIUM 300 MG/3ML IJ SOLN
1.0000 mg/kg | Freq: Two times a day (BID) | INTRAMUSCULAR | Status: DC
  Administered 2019-05-18 – 2019-05-22 (×8): 155 mg via SUBCUTANEOUS
  Filled 2019-05-18 (×11): qty 1.55

## 2019-05-18 MED ORDER — OXYCODONE HCL 5 MG/5ML PO SOLN
7.5000 mg | ORAL | Status: DC | PRN
  Administered 2019-05-18 – 2019-05-24 (×24): 7.5 mg
  Filled 2019-05-18 (×24): qty 10

## 2019-05-18 NOTE — Progress Notes (Signed)
  Speech Language Pathology Treatment: Dysphagia  Patient Details Name: Kyle [name] MRN: [id number] DOB: January 24, 1956 Today's Date: 05/18/2019 Time: [id number] SLP Time Calculation (min) (ACUTE ONLY): 13 min  Assessment / Plan / Recommendation Clinical Impression  Skilled treatment focused on dysphagia. SLP recieved pt upright in bed willing to participate in therapy. Pt continues to be independent with oral care once items are handed to him. SLP provided skilled observation of pt consuming ice chips x 5. Pt with immediate wet voice and cough likely d/t delayed swallow initiation. As a result, recommend continued NPO with ice chips to be provided after oral care to facilitate use of swallow musculature.   Education also provided ot pt's nurse that pt would be discharging to SNF as NPO with nutrition provided thru PEG.     HPI HPI: Kyle [name] is a 63 y.o. M with hx of CVA, sleep apnea, SOB, peripheral vascular disease, HPT, GERD, gastric bypass surgery(2013), atrial fibrillation, arthritis, tobacco use, and ETOH admitted 11/15 after friend found sitting in car, confused. Head CT Showed hyperdense R MCA bifurcation, no acute infarct and atrophy with chronic temporal infarct / chronic L frontal infarct. MRI showed R MCA restricuted diffusion primarily involving operculum. No associated hemorrhage or mass efect. Underlying bilateral MCA chronic encephalomalacia. Briefly intubated 11/15 for arteriogram. Recently admitted to ED 11/06 for fall with R ankle sprain and 11/08 for another fall, NR reported pt is chronic opiate user due to chronic back pain. Diet currently NPO.       SLP Plan  Continue with current plan of care       Recommendations  Diet recommendations: NPO Medication Administration: Via alternative means                Oral Care Recommendations: Oral care QID Follow up Recommendations: Skilled Nursing facility SLP Visit Diagnosis: Dysphagia, unspecified  (R13.10);Dysarthria and anarthria (R47.1) Plan: Continue with current plan of care       [place] 05/18/2019, 3:53 PM

## 2019-05-18 NOTE — TOC Progression Note (Addendum)
Transition of Care [facility]) - Progression Note    Patient Details  Name: Kyle [name] MRN: [id number] Date of Birth: 07/21/1955  Transition of Care [facility]) CM/SW [place], [place] Phone Number: 05/18/2019, 11:05 AM  Clinical Narrative:     Update 2:10pm: Insurance authorization received. Patient will be medically ready on Friday. Approved for 5 days, next review date is 12/7. Reference number is [id number].   CSW called Olivia Mackie with [employer] Insurance authorization has not been received. Patient will need new COVID if he does not discharge today.   CSW will continue to follow and assist with additional TOC needs.   Expected Discharge Plan: [place] Barriers to Discharge: Continued Medical Work up, Ship broker  Expected Discharge Plan and Services Expected Discharge Plan: [place] arrangements for the past 2 months: Single Family Home                                       Social Determinants of Health (SDOH) Interventions    Readmission Risk Interventions No flowsheet data found.

## 2019-05-18 NOTE — Progress Notes (Addendum)
STROKE TEAM PROGRESS NOTE   INTERVAL HISTORY Pt lying in bed, complains of J tube incision site pain, asking for increase pain medication to Q3h. I changed his oxycodone from 10mg  Q4 PRN to 7.5mg  Q3 PRN. Gradually advance J tube feeding, up to 75 by tomorrow. Plan to dc tomorrow to SNF   Vitals:   05/18/19 0357 05/18/19 0809 05/18/19 0825 05/18/19 1132  BP: 129/78 128/81  127/72  Pulse: 91 65  89  Resp: 14 16  18   Temp: 98.7 F (37.1 C) 97.8 F (36.6 C)  98.3 F (36.8 C)  TempSrc: Oral Oral  Oral  SpO2: 93% 97% 96% 98%  Weight:      Height:        CBC:  Recent Labs  Lab 05/17/19 0351 05/18/19 0344  WBC 9.7 8.7  HGB 15.0 14.6  HCT 46.2 45.3  MCV 84.5 85.3  PLT 257 [id number]   Basic Metabolic Panel:  Recent Labs  Lab 05/17/19 0351 05/18/19 0344  NA 134* 136  K 4.1 3.9  CL 95* 97*  CO2 29 27  GLUCOSE 98 94  BUN 13 14  CREATININE 0.70 0.71  CALCIUM 9.4 9.3   Lipid Panel:     Component Value Date/Time   CHOL 102 05/01/2019 0630   CHOL 142 12/19/2014 0804   TRIG 98 05/01/2019 0630   HDL 25 (L) 05/01/2019 0630   HDL 29 (L) 12/19/2014 0804   CHOLHDL 4.1 05/01/2019 0630   VLDL 20 05/01/2019 0630   LDLCALC 57 05/01/2019 0630   LDLCALC 93 12/19/2014 0804   HgbA1c:  Lab Results  Component Value Date   HGBA1C 5.5 05/01/2019   Urine Drug Screen:     Component Value Date/Time   LABOPIA NONE DETECTED 04/30/2019 1507   COCAINSCRNUR NONE DETECTED 04/30/2019 1507   COCAINSCRNUR Negative 03/15/2015 1141   LABBENZ NONE DETECTED 04/30/2019 1507   AMPHETMU POSITIVE (A) 04/30/2019 1507   THCU NONE DETECTED 04/30/2019 1507   LABBARB NONE DETECTED 04/30/2019 1507    Alcohol Level     Component Value Date/Time   ETH <10 04/30/2019 1152    IMAGING past 24h No results found.  PHYSICAL EXAM   General - morbidly obese, well developed, not in acute distress.  Ophthalmologic - fundi not visualized due to noncooperation.  Cardiovascular - irregularly irregular heart  rate and rhythm.  Neuro - awake alert, still anarthric, not able to answer orientation questions but able to write them out on a note bad, he is fully oriented. Able to repeat "I am here", very dysarthric but not able to repeat other sentences. Follows all simple commands PERRL, EOMI, blinking to visual threat bilaterally. Bilateral facial weakness but left seems more than right. Tongue protrusion difficulty. RUE 5/5, LUE 4+/5 proximal and 4/5 finger grip. RLE 5/5. LLE 4+/5 proximal and 5-/5 distal. DTR 1+ and no babinski. Sensation symmetrical. Bilateral FTN intact. Gait not tested.    ASSESSMENT/PLAN Kyle [name] is a 63 y.o. male with history of a. Fib ( not anticoagulated) sick sinus syndrom, HTN, CVA, OSA ( not on CPAP) presenting with right side gaze and left arm paralysis.   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 Probably hyperdense R MCA bifurcation. Old R temporal lobe and L frontal lobe infarcts. .     CTA head & neck suboptimal. Occlusion  R MCA at bifurcation.  CT perfusion large delayed perfusion R MCA. No core infarct.   Cerebral angio occluded R M1, TICI2c   MRI  R MCA infarct (operculum). B MCA encephalomalacia  MRA  Improved R MCA patency  2D Echo EF 60-65%. No source of embolus   LDL 57   HgbA1c 5.5   HIV neg  Lovenox 40 mg sq daily for VTE prophylaxis  aspirin 81 mg daily prior to admission, off IV heparin d/t bleeding from penis, but resumed 11/26. Back on heparin post J tube placement - as no crushed pills, switch heparin IV to lovenox bid    Therapy recommendations:  CIR->family prefer SNF  Disposition:  Pending.  Bed obtained. Insurance pending.   repeat COVID test for d/c on 1/21 negative  Have reached out to CSW to see if d/c possible tomorrow and if COVID needs repeating again today   Atrial Fibrillation /  SSS  Home anticoagulation:  not taking   Started on Xarelto 09/15/2018, documented not taking 04/23/2019 (family practice note)  off IV heparin d/t bleeding from penis, resumed 11/26. Back on heparin post J tube placement - as no crushed pills, switch heparin IV to lovenox bid    Bradycardia with pauses (2 seconds) - Dr Rory Percy saw 11/20 - pt asymptomatic - currently rate in 70's - decreased metoprolol dose.   Hypertension  Home meds:  Lisinopril 20 . Treated w/ cleviprex, now off. Labetalol prn  . BP goal < 180 . BP stable now    . On metoprolol 12.5 bid . Long-term BP goal normotensive  Hyperlipidemia  Home meds:  Prescribed lipitor - ? taking  LDL 57, goal < 70  Was on lipitor 40 -> off lipitor due to no liquid form of statin available for tube administration and pt NPO  Dysphagia . Secondary to stroke . NPO . Speech on board . J tube placed by surgery 12/1 (with lysis of adhesions) . Trickle TF started 12/2 and tolerating. Plan increase rate of 10cc every 4h to goal of 75 . Continue free water 100 Q6h . No crushed meds should be given via J tube - confirmed with surgery that meds cannot be given via J tube  Chronic pain syndrome  chronic B LBP w/ R sided sciatica  Pain from recent fall on narcotics at home.   [facility].  Also complains of J tube surgery incision site pain  Change oxycodone 10 mg every 4 hours as needed to 7.5 mg every 3 hours as needed  Tobacco abuse  Current smoker  Smoking cessation counseling provided  Pt is willing to quit   Other Stroke Risk Factors  ETOH use, alcohol level <10, advised to drink no more than 2 drink(s) a day  Substance abuse - UDS positive for Amphetamines   Morbid Obesity, Body mass index is 42.44 kg/m., hx failed open gastroplasty in 1993, recommend weight loss, diet and exercise as appropriate   Hx stroke/TIA by imaging - Old R temporal lobe and L frontal  lobe infarcts - likely due to AFib  Obstructive sleep apnea, not on CPAP at home  PVD   Other Active Problems  Leukocytosis, resolved.   COPD  Hypokalemia, resolved  Adjustment d/o w/ mixed anxiety and depressed mood  LBP on oxycodone PRN  Hospital day # 18  Kyle Hawking, MD PhD Stroke Neurology 05/18/2019 1:36 PM  To contact Stroke Continuity provider, please refer to [email or web address] After  hours, contact General Neurology

## 2019-05-18 NOTE — Progress Notes (Addendum)
ANTICOAGULATION CONSULT NOTE - Follow Up Consult  Pharmacy Consult for Heparin Indication: atrial fibrillation  Allergies  Allergen Reactions  . Vioxx [Rofecoxib] Rash    Patient Measurements: Height: 6\' 3"  (190.5 cm) Weight: (!) 339 lb 8.1 oz (154 kg) IBW/kg (Calculated) : 84.5 Heparin Dosing Weight:  120.1 kg  Vital Signs: Temp: 98.7 F (37.1 C) (12/03 0357) Temp Source: Oral (12/03 0357) BP: 129/78 (12/03 0357) Pulse Rate: 91 (12/03 0357)  Labs: Recent Labs    05/16/19 0413  05/17/19 0351 05/17/19 1126 05/18/19 0344  HGB 14.2  --  15.0  --  14.6  HCT 44.6  --  46.2  --  45.3  PLT 263  --  257  --  248  HEPARINUNFRC 0.37   < > 0.46 0.28* 0.74*  CREATININE 0.67  --  0.70  --  0.71   < > = values in this interval not displayed.    Estimated Creatinine Clearance: 154 mL/min (by C-G formula based on SCr of 0.71 mg/dL).   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 Hx of Afib with stroke likely d/t not being on anticoagulation. Heparin had been on hold d/t penile bleeding, now resolved and stroke team resumed 11/26. Heparin held again 12/1 AM for PEG placement. Pharmacy consulted to resume heparin post PEG.  Heparin level supratherapeutic at 0.74, no bleeding reported, CBC stable.    Goal of Therapy:  Heparin level 0.3-0.5 units/ml Monitor platelets by anticoagulation protocol: Yes   Plan:  Decrease heparin gtt to 2350 units/hr F/u 6 hour heparin level   Addendum: Will transition to lovenox this afternoon Stop heparin gtt at 1800 Give lovenox 1mg /kg q 12h starting [place], PharmD Clinical Pharmacist Please check AMION for all [place] numbers 05/18/2019 7:41 AM

## 2019-05-18 NOTE — Progress Notes (Signed)
Physical Therapy Treatment Patient Details Name: Kyle [name] MRN: [place]:[id number] DOB: [birth year] Today's Date: 05/18/2019    History of Present Illness Kyle [name] is an 63 y.o. male  With PMH a. Fib ( not anticoagulated) sick sinus syndrom, HTN, CVA, OSA ( not on CPAP) who presented to [facility] as a code stroke. Pt found to have R MCA acute ischemic stroke due to right M1 occlusion, pt s/p mechanical thrombectomy.    PT Comments    Pt performed gt training and functional mobility during session.  His gt was limited from bed to recliner chair secondary to increased pain.  He requested for pain meds and PTA inquired with MD and RN to address patient's needs.  He presents with productive cough during session and required splint to peg site.  Pt resting in chair post session with lines and leads in place.  Continue to recommend SNF placement to improve strength, function and endurace before returning home.     Follow Up Recommendations  SNF     Equipment Recommendations  (TBD next venue)    Recommendations for Other Services Rehab consult     Precautions / Restrictions Precautions Precautions: Fall Precaution Comments: strong L lateral lean Restrictions Weight Bearing Restrictions: No    Mobility  Bed Mobility Overal bed mobility: Needs Assistance Bed Mobility: Supine to Sit     Supine to sit: Mod assist     General bed mobility comments: pt progressed to EOB with mod assistance, required increased assistance due to pain.  Transfers Overall transfer level: Needs assistance Equipment used: Rolling walker (2 wheeled)(bariatric) Transfers: Sit to/from Stand Sit to Stand: Min assist Stand pivot transfers: Min assist       General transfer comment: Pt moved from bed to recliner at bedside.  He presented with flexed posture and poor activity tolerance.  Increased pain noted.  Ambulation/Gait Ambulation/Gait assistance: Min assist Gait Distance (Feet): 4 Feet Assistive  device: Rolling walker (2 wheeled) Gait Pattern/deviations: Wide base of support;Shuffle;Step-to pattern;Trunk flexed     General Gait Details: Wide waddling pattern to move from bed to chair.  Very poor foot clearance this session.   Stairs             Wheelchair Mobility    Modified Rankin (Stroke Patients Only)       Balance                                            Cognition Arousal/Alertness: Awake/alert Behavior During Therapy: WFL for tasks assessed/performed Overall Cognitive Status: Difficult to assess Area of Impairment: Safety/judgement;Following commands;Attention;Awareness;Problem solving                   Current Attention Level: Selective   Following Commands: Follows one step commands with increased time Safety/Judgement: Decreased awareness of deficits Awareness: Emergent Problem Solving: Requires verbal cues General Comments: Pt continues to write on paper to communicate which makes assesing cognition difficult.  He responds with head nods, pointing and gestures.      Exercises      General Comments        Pertinent Vitals/Pain Pain Assessment: 0-10 Pain Score: 10-Worst pain ever Pain Location: abdomen at PEG site Pain Descriptors / Indicators: Stabbing;Sharp Pain Intervention(s): Monitored during session;Repositioned;Patient requesting pain meds-RN notified(informed RN and md of need for pain meds.)    Home Living  Prior Function            PT Goals (current goals can now be found in the care plan section) Acute Rehab PT Goals PT Goal Formulation: With patient Potential to Achieve Goals: Fair Progress towards PT goals: Progressing toward goals    Frequency    Min 3X/week      PT Plan Current plan remains appropriate    Co-evaluation              AM-PAC PT "6 Clicks" Mobility   Outcome Measure  Help needed turning from your back to your side while in a flat  bed without using bedrails?: A Lot Help needed moving from lying on your back to sitting on the side of a flat bed without using bedrails?: A Lot Help needed moving to and from a bed to a chair (including a wheelchair)?: A Lot Help needed standing up from a chair using your arms (e.g., wheelchair or bedside chair)?: A Little Help needed to walk in hospital room?: A Little Help needed climbing 3-5 steps with a railing? : A Lot 6 Click Score: 14    End of Session Equipment Utilized During Treatment: Gait belt Activity Tolerance: Patient tolerated treatment well;Patient limited by pain Patient left: in chair;with call bell/phone within reach Nurse Communication: Mobility status PT Visit Diagnosis: Difficulty in walking, not elsewhere classified (R26.2);Muscle weakness (generalized) (M62.81)     Time: [id number] PT Time Calculation (min) (ACUTE ONLY): 44 min  Charges:  $Gait Training: 8-22 mins $Therapeutic Activity: 23-37 mins                     Erasmo Leventhal , PTA Acute Rehabilitation Services Pager [phone] Office [phone]     Jowana Thumma Eli Hose 05/18/2019, 4:40 PM

## 2019-05-18 NOTE — Progress Notes (Signed)
[facility] Progress Note  2 Days Post-Op  Subjective: CC-  Abdominal pain a little better than yesterday. Denies bloating, nausea, or vomiting. No flatus or BM. Tolerating tube feedings at 10cc/hr.  Objective: Vital signs in last 24 hours: Temp:  [98 F (36.7 C)-98.7 F (37.1 C)] 98.7 F (37.1 C) (12/03 0357) Pulse Rate:  [69-94] 91 (12/03 0357) Resp:  [12-14] 14 (12/03 0357) BP: (108-143)/(70-79) 129/78 (12/03 0357) SpO2:  [90 %-100 %] 93 % (12/03 0357) Last BM Date: 05/14/19  Intake/Output from previous day: 12/02 0701 - 12/03 0700 In: 470 [I.V.:297.2; NG/GT:32.8] Out: 1875 [Urine:1875] Intake/Output this shift: No intake/output data recorded.  PE: Gen:  Alert, NAD, pleasant Pulm:  Rate and effort normal Abd: obese, soft, appropriately tender, few BS heard, lap incisions cdi, J tube site cdi with TF running at 10cc/hr Skin: warm and dry   Lab Results:  Recent Labs    05/17/19 0351 05/18/19 0344  WBC 9.7 8.7  HGB 15.0 14.6  HCT 46.2 45.3  PLT 257 248   BMET Recent Labs    05/17/19 0351 05/18/19 0344  NA 134* 136  K 4.1 3.9  CL 95* 97*  CO2 29 27  GLUCOSE 98 94  BUN 13 14  CREATININE 0.70 0.71  CALCIUM 9.4 9.3   PT/INR No results for input(s): LABPROT, INR in the last 72 hours. CMP     Component Value Date/Time   NA 136 05/18/2019 0344   NA 139 12/19/2014 0804   K 3.9 05/18/2019 0344   CL 97 (L) 05/18/2019 0344   CO2 27 05/18/2019 0344   GLUCOSE 94 05/18/2019 0344   BUN 14 05/18/2019 0344   BUN 16 12/19/2014 0804   CREATININE 0.71 05/18/2019 0344   CREATININE 0.75 06/20/2011 1032   CALCIUM 9.3 05/18/2019 0344   PROT 7.2 04/30/2019 1152   PROT 6.9 12/19/2014 0804   ALBUMIN 3.5 04/30/2019 1152   ALBUMIN 4.2 12/19/2014 0804   AST 24 04/30/2019 1152   ALT 16 04/30/2019 1152   ALKPHOS 77 04/30/2019 1152   BILITOT 1.0 04/30/2019 1152   BILITOT 0.8 12/19/2014 0804   GFRNONAA >60 05/18/2019 0344   GFRAA >60 05/18/2019 0344    Lipase     Component Value Date/Time   LIPASE 28 04/23/2019 1734       Studies/Results: No results found.  Anti-infectives: Anti-infectives (From admission, onward)   Start     Dose/Rate Route Frequency Ordered Stop   05/16/19 0945  cefoTEtan (CEFOTAN) 2 g in sodium chloride 0.9 % 100 mL IVPB     2 g 200 mL/hr over 30 Minutes Intravenous To Surgery 05/16/19 0934 05/16/19 0938   05/09/19 0600  cefoTEtan (CEFOTAN) 2 g in sodium chloride 0.9 % 100 mL IVPB     2 g 200 mL/hr over 30 Minutes Intravenous On call to O.R. 05/08/19 1533 05/10/19 0559   04/30/19 1227  ceFAZolin (ANCEF) 2-4 GM/100ML-% IVPB    Note to Pharmacy: Margaretmary Dys   : cabinet override      04/30/19 1227 05/01/19 0029       Assessment/Plan Atrial fibrillation HTN H/o CVA Tobacco abuse OSA H/o failed gastroplasty in 1992, modified sleeve gastrectomy/ 3 hours of lysis of adhesionsby Dr. Hassell Done in 2013  Right MCAinfarcts/pTICI 2c revascularizationby IR11/15/20 Dysphagia S/p LAPAROSCOPIC JEJUNOSTOMY TUBE (N/A), LAPAROSCOPIC ADHESIOLYSIS X 15min 12/1 Dr. Rosendo Gros - POD#2 - Advance tube feedings to 27mL/hr, then increase 23mL every 4 hours as tolerated to goal rate of 75mL/hr.  Continue therapies, mobilize.  ID - none currently FEN - NPO, TF @ 26mL/hr advance as tolerated to goa. VTE - SCDs, heparin gtt Foley - none    LOS: 18 days    Wellington Hampshire, [name] 05/18/2019, 7:37 AM Please see Amion for pager number during day hours 7:00am-4:30pm

## 2019-05-19 LAB — BASIC METABOLIC PANEL
Anion gap: 11 (ref 5–15)
BUN: 18 mg/dL (ref 8–23)
CO2: 25 mmol/L (ref 22–32)
Calcium: 9.1 mg/dL (ref 8.9–10.3)
Chloride: 99 mmol/L (ref 98–111)
Creatinine, Ser: 0.84 mg/dL (ref 0.61–1.24)
GFR calc Af Amer: >60 mL/min (ref >60)
GFR calc non Af Amer: >60 mL/min (ref >60)
Glucose, Bld: 93 mg/dL (ref 70–99)
Potassium: 3.8 mmol/L (ref 3.5–5.1)
Sodium: 135 mmol/L (ref 135–145)

## 2019-05-19 LAB — CBC
HCT: 44 % (ref 39.0–52.0)
Hemoglobin: 14.7 g/dL (ref 13.0–17.0)
MCH: 28.1 pg (ref 26.0–34.0)
MCHC: 33.4 g/dL (ref 30.0–36.0)
MCV: 84.1 fL (ref 80.0–100.0)
Platelets: 249 10*3/uL (ref 150–400)
RBC: 5.23 MIL/uL (ref 4.22–5.81)
RDW: 15.5 % (ref 11.5–15.5)
WBC: 9.3 10*3/uL (ref 4.0–10.5)
nRBC: 0 % (ref 0.0–0.2)

## 2019-05-19 LAB — GLUCOSE, CAPILLARY
Glucose-Capillary: 105 mg/dL — ABNORMAL HIGH (ref 70–99)
Glucose-Capillary: 78 mg/dL (ref 70–99)
Glucose-Capillary: 127 mg/dL — ABNORMAL HIGH (ref 70–99)
Glucose-Capillary: 93 mg/dL (ref 70–99)
Glucose-Capillary: 94 mg/dL (ref 70–99)
Glucose-Capillary: 80 mg/dL (ref 70–99)
Glucose-Capillary: 106 mg/dL — ABNORMAL HIGH (ref 70–99)

## 2019-05-19 MED ORDER — OXYCODONE HCL 5 MG/5ML PO SOLN: 7.5000 mg | ORAL | 0 refills | Status: DC | PRN

## 2019-05-19 MED ORDER — ALUM & MAG HYDROXIDE-SIMETH 200-200-20 MG/5ML PO SUSP: 30.0000 mL | ORAL | Status: DC | PRN

## 2019-05-19 MED ORDER — FREE WATER: 100.0000 mL | Freq: Four times a day (QID) | Status: DC

## 2019-05-19 MED ORDER — PRO-STAT SUGAR FREE PO LIQD: 30.0000 mL | Freq: Three times a day (TID) | ORAL | 0 refills | Status: DC

## 2019-05-19 MED ORDER — METOPROLOL TARTRATE 25 MG/10 ML ORAL SUSPENSION: 12.5000 mg | Freq: Two times a day (BID) | ORAL | Status: DC

## 2019-05-19 MED ORDER — JEVITY 1.2 CAL PO LIQD
1000.0000 mL | ORAL | Status: DC
  Administered 2019-05-19 – 2019-05-23 (×6): 1000 mL
  Filled 2019-05-19 (×10): qty 1000

## 2019-05-19 MED ORDER — PANTOPRAZOLE SODIUM 40 MG PO PACK: 40.0000 mg | PACK | Freq: Every day | ORAL | Status: DC

## 2019-05-19 MED ORDER — JEVITY 1.2 CAL PO LIQD: 1000.0000 mL | ORAL | 0 refills | Status: DC

## 2019-05-19 MED ORDER — GABAPENTIN 250 MG/5ML PO SOLN: 600.0000 mg | Freq: Four times a day (QID) | ORAL | 12 refills | Status: DC

## 2019-05-19 MED ORDER — ENOXAPARIN SODIUM 300 MG/3ML IJ SOLN: 1.0000 mg/kg | Freq: Two times a day (BID) | INTRAMUSCULAR | Status: DC

## 2019-05-19 NOTE — Progress Notes (Signed)
Physical Therapy Treatment Patient Details Name: Kyle [name] MRN: [id number]:[id number] DOB: [birth year] Today's Date: 05/19/2019    History of Present Illness FREELAND LECLAIR is an 63 y.o. male  With PMH a. Fib ( not anticoagulated) sick sinus syndrom, HTN, CVA, OSA ( not on CPAP) who presented to [facility] as a code stroke. Pt found to have R MCA acute ischemic stroke due to right M1 occlusion, pt s/p mechanical thrombectomy.    PT Comments    Pt performed gt training and functional mobility during session this am.  He moves very slowly but only requires min-min guard assistance.  He continues to fatigue quickly and requires close chair follow for safety.  Pt will benefit from SNF rehab to improve endurance and strength before returning home.     Follow Up Recommendations  SNF     Equipment Recommendations  (TBD at next venue)    Recommendations for Other Services       Precautions / Restrictions Precautions Precautions: Fall Restrictions Weight Bearing Restrictions: No    Mobility  Bed Mobility Overal bed mobility: Needs Assistance Bed Mobility: Supine to Sit     Supine to sit: Min guard     General bed mobility comments: Decreased assistance to movement EOB.  Transfers Overall transfer level: Needs assistance Equipment used: Rolling walker (2 wheeled)(bariatric) Transfers: Sit to/from Stand Sit to Stand: Min assist         General transfer comment: Pt required min assistance to boost into standing.  Ambulation/Gait Ambulation/Gait assistance: Min guard Gait Distance (Feet): 18 Feet Assistive device: Rolling walker (2 wheeled)(bariatric) Gait Pattern/deviations: Wide base of support;Shuffle;Step-to pattern;Trunk flexed Gait velocity: very decreased   General Gait Details: Continues with slow shuffling wide base with poor B foot clearance.  Anterio lean over RW.   Stairs             Wheelchair Mobility    Modified Rankin (Stroke Patients Only) Modified  Rankin (Stroke Patients Only) Pre-Morbid Rankin Score: No significant disability Modified Rankin: Moderately severe disability     Balance Overall balance assessment: Needs assistance Sitting-balance support: Feet supported;Single extremity supported Sitting balance-Leahy Scale: Good Sitting balance - Comments: Able to functionally reach with supervision   Standing balance support: Bilateral upper extremity supported;During functional activity Standing balance-Leahy Scale: Fair Standing balance comment: dependent on RW                            Cognition Arousal/Alertness: Awake/alert Behavior During Therapy: WFL for tasks assessed/performed Overall Cognitive Status: Within Functional Limits for tasks assessed                                 General Comments: Communicates with paper and clip board but appears with in normal limits.      Exercises General Exercises - Lower Extremity Ankle Circles/Pumps: Both;10 reps;AROM;Seated    General Comments        Pertinent Vitals/Pain Pain Assessment: 0-10 Pain Score: 9  Pain Location: abdomen at PEG site Pain Descriptors / Indicators: Stabbing;Sharp Pain Intervention(s): Monitored during session;Repositioned;RN gave pain meds during session;Patient requesting pain meds-RN notified    Home Living                      Prior Function            PT Goals (current goals can now be found  in the care plan section) Acute Rehab PT Goals PT Goal Formulation: With patient Potential to Achieve Goals: Fair Progress towards PT goals: Progressing toward goals    Frequency    Min 3X/week      PT Plan Current plan remains appropriate    Co-evaluation              AM-PAC PT "6 Clicks" Mobility   Outcome Measure  Help needed turning from your back to your side while in a flat bed without using bedrails?: A Lot Help needed moving from lying on your back to sitting on the side of a flat  bed without using bedrails?: A Lot Help needed moving to and from a bed to a chair (including a wheelchair)?: A Lot Help needed standing up from a chair using your arms (e.g., wheelchair or bedside chair)?: A Little Help needed to walk in hospital room?: A Little Help needed climbing 3-5 steps with a railing? : A Lot 6 Click Score: 14    End of Session Equipment Utilized During Treatment: Gait belt Activity Tolerance: Patient tolerated treatment well;Patient limited by pain Patient left: in chair;with call bell/phone within reach Nurse Communication: Mobility status PT Visit Diagnosis: Difficulty in walking, not elsewhere classified (R26.2);Muscle weakness (generalized) (M62.81)     Time: [id number] PT Time Calculation (min) (ACUTE ONLY): 27 min  Charges:  $Gait Training: 8-22 mins $Therapeutic Activity: 8-22 mins                    Erasmo Leventhal , PTA Acute Rehabilitation Services Pager [phone] Office [phone]     Mouhamadou Gittleman Eli Hose 05/19/2019, 11:44 AM

## 2019-05-19 NOTE — TOC Progression Note (Signed)
Transition of Care [facility]) - Progression Note    Patient Details  Name: Kyle [name] MRN: [place]:[id number] Date of Birth: 06-Aug-1955  Transition of Care [facility]) CM/SW [place], [place] Phone Number: 05/19/2019, 1:07 PM  Clinical Narrative:     Olivia Mackie from [facility] called and stated they have had 8 confirmed COVID cases today and would not have an open bed until Monday. They also did not have the patient's TPN and it would not be in until Tuesday.   CSW has informed MD. The patient does not have any other bed offers. Patient will remain on 3W until Monday. CSW will continue to follow and assist with disposition.  Expected Discharge Plan: [place] Barriers to Discharge: Continued Medical Work up, Ship broker  Expected Discharge Plan and Services Expected Discharge Plan: [place] arrangements for the past 2 months: Single Family Home Expected Discharge Date: 05/19/19                                     Social Determinants of Health (SDOH) Interventions    Readmission Risk Interventions No flowsheet data found.

## 2019-05-19 NOTE — Progress Notes (Signed)
STROKE TEAM PROGRESS NOTE   INTERVAL HISTORY Pt sitting at the bedside, wrote to me that he walked with PT today in hallway, asking for continue pain medication Q3h PRN, asking how long he will stay in SNF and how to follow up in the clinic. I answered all his questions. J tube feeding now at goal. He supposed to transfer to SNF today however, unfortunately, the SNF he goes to had [place] breakout, he will stay until Monday.    Vitals:   05/19/19 0739 05/19/19 0843 05/19/19 1223 05/19/19 1607  BP:  131/69 104/63 108/73  Pulse:  68 78 70  Resp:  18 18 18   Temp:  97.8 F (36.6 C) 98.6 F (37 C) 98.5 F (36.9 C)  TempSrc:  Oral Oral Oral  SpO2: 100% 97% 97% 95%  Weight:      Height:        CBC:  Recent Labs  Lab 05/18/19 0344 05/19/19 0256  WBC 8.7 9.3  HGB 14.6 14.7  HCT 45.3 44.0  MCV 85.3 84.1  PLT 248 0000000   Basic Metabolic Panel:  Recent Labs  Lab 05/18/19 0344 05/19/19 0256  NA 136 135  K 3.9 3.8  CL 97* 99  CO2 27 25  GLUCOSE 94 93  BUN 14 18  CREATININE 0.71 0.84  CALCIUM 9.3 9.1   Lipid Panel:     Component Value Date/Time   CHOL 102 05/01/2019 0630   CHOL 142 12/19/2014 0804   TRIG 98 05/01/2019 0630   HDL 25 (L) 05/01/2019 0630   HDL 29 (L) 12/19/2014 0804   CHOLHDL 4.1 05/01/2019 0630   VLDL 20 05/01/2019 0630   LDLCALC 57 05/01/2019 0630   LDLCALC 93 12/19/2014 0804   HgbA1c:  Lab Results  Component Value Date   HGBA1C 5.5 05/01/2019   Urine Drug Screen:     Component Value Date/Time   LABOPIA NONE DETECTED 04/30/2019 1507   COCAINSCRNUR NONE DETECTED 04/30/2019 1507   COCAINSCRNUR Negative 03/15/2015 1141   LABBENZ NONE DETECTED 04/30/2019 1507   AMPHETMU POSITIVE (A) 04/30/2019 1507   THCU NONE DETECTED 04/30/2019 1507   LABBARB NONE DETECTED 04/30/2019 1507    Alcohol Level     Component Value Date/Time   ETH <10 04/30/2019 1152    IMAGING past 24h No results found.  PHYSICAL EXAM   General - morbidly obese, well  developed, not in acute distress.  Ophthalmologic - fundi not visualized due to noncooperation.  Cardiovascular - irregularly irregular heart rate and rhythm.  Neuro - awake alert, still anarthric, not able to answer orientation questions but able to write them out on a note bad, he is fully oriented. Able to repeat "I am here", very dysarthric but not able to repeat other sentences. Follows all simple commands PERRL, EOMI, blinking to visual threat bilaterally. Bilateral facial weakness but left seems more than right. Tongue protrusion difficulty. RUE 5/5, LUE 5-/5 proximal and 4+/5 finger grip. RLE 5/5. LLE 5-/5 proximal and distal. DTR 1+ and no babinski. Sensation symmetrical. Bilateral FTN intact. Gait not tested.    ASSESSMENT/PLAN [name] is a 63 y.o. male with history of a. Fib ( not anticoagulated) sick sinus syndrom, HTN, CVA, OSA ( not on CPAP) presenting with right side gaze and left arm paralysis.   Stroke: R MCA infarct due to right MCA occlusion s/p IR w/ TICI2c revascularization -  Embolic econdary to known AF not on [facility]  Code Stroke CT head No acute  abnormality. Probably hyperdense R MCA bifurcation. Old R temporal lobe and L frontal lobe infarcts. .     CTA head & neck suboptimal. Occlusion R MCA at bifurcation.  CT perfusion large delayed perfusion R MCA. No core infarct.   Cerebral angio occluded R M1, TICI2c   MRI  R MCA infarct (operculum). B MCA encephalomalacia  MRA  Improved R MCA patency  2D Echo EF 60-65%. No source of embolus   LDL 57   HgbA1c 5.5   HIV neg  Lovenox 40 mg sq daily for VTE prophylaxis  aspirin 81 mg daily prior to admission, off IV heparin d/t bleeding from penis, but resumed 11/26. Back on heparin post J tube placement - as no crushed pills, switch heparin IV to lovenox bid    Therapy recommendations:  CIR->family prefer SNF  Disposition:  Likely SNF on Monday.   repeat COVID again Sunday in order to be discharged on  Monday  Atrial Fibrillation / SSS  Home anticoagulation:  not taking   Started on Xarelto 09/15/2018, documented not taking 04/23/2019 (family practice note)  off IV heparin d/t bleeding from penis, resumed 11/26. Back on heparin post J tube placement - as no crushed pills, switch heparin IV to lovenox bid    Bradycardia with pauses (2 seconds) - Dr Rory Percy saw 11/20 - pt asymptomatic - currently rate in 70's - decreased metoprolol dose.   Hypertension  Home meds:  Lisinopril 20 . Treated w/ cleviprex, now off. Labetalol prn  . BP goal < 180 . BP stable now    . On metoprolol 12.5 bid . Long-term BP goal normotensive  Hyperlipidemia  Home meds:  Prescribed lipitor - ? taking  LDL 57, goal < 70  Was on lipitor 40 -> off lipitor due to no liquid form of statin available for tube administration and pt NPO  Dysphagia . Secondary to stroke . NPO . Speech on board . J tube placed by surgery 12/1 (with lysis of adhesions) . Trickle TF started 12/2 and tolerating now at goal of 75 . Continue free water 100 Q6h . No crushed meds should be given via J tube - confirmed with surgery that meds cannot be given via J tube  Chronic pain syndrome  chronic B LBP w/ R sided sciatica  Pain from recent fall on narcotics at home.   Followed at [facility].  Also complains of J tube surgery incision site pain  Change oxycodone 10 mg every 4 hours as needed to 7.5 mg every 3 hours as needed  Tobacco abuse  Current smoker  Smoking cessation counseling provided  Pt is willing to quit   Other Stroke Risk Factors  ETOH use, alcohol level <10, advised to drink no more than 2 drink(s) a day  Substance abuse - UDS positive for Amphetamines   Morbid Obesity, Body mass index is 42.37 kg/m., hx failed open gastroplasty in 1993, recommend weight loss, diet and exercise as appropriate   Hx stroke/TIA by imaging - Old R temporal lobe and L frontal  lobe infarcts - likely due to AFib  Obstructive sleep apnea, not on CPAP at home  PVD   Other Active Problems  Leukocytosis, resolved.   COPD  Hypokalemia, resolved  Adjustment d/o w/ mixed anxiety and depressed mood  LBP on oxycodone PRN  Hospital day # 19  Kyle Hawking, MD PhD Stroke Neurology 05/19/2019 5:37 PM  To contact Stroke Continuity provider, please refer to [email or web address]  After hours, contact General Neurology

## 2019-05-20 LAB — GLUCOSE, CAPILLARY
Glucose-Capillary: 97 mg/dL (ref 70–99)
Glucose-Capillary: 92 mg/dL (ref 70–99)
Glucose-Capillary: 86 mg/dL (ref 70–99)
Glucose-Capillary: 70 mg/dL (ref 70–99)
Glucose-Capillary: 88 mg/dL (ref 70–99)
Glucose-Capillary: 97 mg/dL (ref 70–99)

## 2019-05-20 LAB — CBC
HCT: 45.8 % (ref 39.0–52.0)
Hemoglobin: 14.7 g/dL (ref 13.0–17.0)
MCH: 27.6 pg (ref 26.0–34.0)
MCHC: 32.1 g/dL (ref 30.0–36.0)
MCV: 86.1 fL (ref 80.0–100.0)
Platelets: 267 10*3/uL (ref 150–400)
RBC: 5.32 MIL/uL (ref 4.22–5.81)
RDW: 15.9 % — ABNORMAL HIGH (ref 11.5–15.5)
WBC: 8.9 10*3/uL (ref 4.0–10.5)
nRBC: 0 % (ref 0.0–0.2)

## 2019-05-20 LAB — BASIC METABOLIC PANEL
Anion gap: 10 (ref 5–15)
BUN: 19 mg/dL (ref 8–23)
CO2: 28 mmol/L (ref 22–32)
Calcium: 9.1 mg/dL (ref 8.9–10.3)
Chloride: 100 mmol/L (ref 98–111)
Creatinine, Ser: 0.72 mg/dL (ref 0.61–1.24)
GFR calc Af Amer: >60 mL/min (ref >60)
GFR calc non Af Amer: >60 mL/min (ref >60)
Glucose, Bld: 101 mg/dL — ABNORMAL HIGH (ref 70–99)
Potassium: 3.8 mmol/L (ref 3.5–5.1)
Sodium: 138 mmol/L (ref 135–145)

## 2019-05-20 MED ORDER — METOPROLOL TARTRATE 25 MG/10 ML ORAL SUSPENSION
12.5000 mg | Freq: Two times a day (BID) | ORAL | Status: DC
  Administered 2019-05-20 – 2019-05-24 (×8): 12.5 mg
  Filled 2019-05-20 (×8): qty 5

## 2019-05-20 NOTE — Plan of Care (Signed)
Called by floor RN Patient had 2-second pause on telemetry. Asymptomatic Continue to monitor clinically Stroke team to continue to follow  -- Amie [place], MD [facility] Pager: [phone] If 7pm to 7am, please call on call as listed on AMION.

## 2019-05-20 NOTE — Progress Notes (Signed)
Telemetry reported a 2.20 sec pause on the heart monitor; MD paged; no acute distress; continue to monitor.

## 2019-05-20 NOTE — Progress Notes (Signed)
STROKE TEAM PROGRESS NOTE   INTERVAL HISTORY Pt in bed, spoke with nurse who reported he has been asking for pain medication. He [name] asking me for more, I advised him we are keeping a close watch on his pain and his nurse will be back in but he appears comfortable to me currently. I answered all his questions, he used a pad to write. J tube feeding now at goal. He supposed to transfer to SNF yesterday however, unfortunately, the SNF he goes to had Kyle [name] breakout, he will stay until Monday.    Vitals:   05/20/19 0131 05/20/19 0325 05/20/19 0500 05/20/19 0746  BP: 129/71 105/74  106/88  Pulse:  (!) 59  98  Resp: 14 14    Temp:  97.9 F (36.6 C)  98 F (36.7 C)  TempSrc:  Oral  Oral  SpO2:  94%  100%  Weight:   (!) 151.5 kg   Height:        CBC:  Recent Labs  Lab 05/19/19 0256 05/20/19 0249  WBC 9.3 8.9  HGB 14.7 14.7  HCT 44.0 45.8  MCV 84.1 86.1  PLT 249 99991111   Basic Metabolic Panel:  Recent Labs  Lab 05/19/19 0256 05/20/19 0249  NA 135 138  K 3.8 3.8  CL 99 100  CO2 25 28  GLUCOSE 93 101*  BUN 18 19  CREATININE 0.84 0.72  CALCIUM 9.1 9.1   Lipid Panel:     Component Value Date/Time   CHOL 102 05/01/2019 0630   CHOL 142 12/19/2014 0804   TRIG 98 05/01/2019 0630   HDL 25 (L) 05/01/2019 0630   HDL 29 (L) 12/19/2014 0804   CHOLHDL 4.1 05/01/2019 0630   VLDL 20 05/01/2019 0630   LDLCALC 57 05/01/2019 0630   LDLCALC 93 12/19/2014 0804   HgbA1c:  Lab Results  Component Value Date   HGBA1C 5.5 05/01/2019   Urine Drug Screen:     Component Value Date/Time   LABOPIA NONE DETECTED 04/30/2019 1507   COCAINSCRNUR NONE DETECTED 04/30/2019 1507   COCAINSCRNUR Negative 03/15/2015 1141   LABBENZ NONE DETECTED 04/30/2019 1507   AMPHETMU POSITIVE (A) 04/30/2019 1507   THCU NONE DETECTED 04/30/2019 1507   LABBARB NONE DETECTED 04/30/2019 1507    Alcohol Level     Component Value Date/Time   ETH <10 04/30/2019 1152    IMAGING past 24h No results  found.  PHYSICAL EXAM   General - morbidly obese, well developed, not in acute distress.  Ophthalmologic - fundi not visualized due to noncooperation.  Cardiovascular - irregularly irregular heart rate and rhythm.  Neuro - awake alert, still anarthric, not able to answer orientation questions but able to write them out on a note bad, he [name] fully oriented. Able to repeat "I am here", very dysarthric but not able to repeat other sentences. Follows all simple commands PERRL, EOMI, blinking to visual threat bilaterally. Bilateral facial weakness but left seems more than right. Tongue protrusion difficulty. RUE 5/5, LUE 5-/5 proximal and 4+/5 finger grip. RLE 5/5. LLE 5-/5 proximal and distal. DTR 1+ and no babinski. Sensation symmetrical. Bilateral FTN intact. Gait not tested.    ASSESSMENT/PLAN Kyle [name] a 63 y.o. male with history of a. Fib ( not anticoagulated) sick sinus syndrom, HTN, CVA, OSA ( not on CPAP) presenting with right side gaze and left arm paralysis.   Stroke: R MCA infarct due to right MCA occlusion s/p IR w/ TICI2c revascularization -  Embolic  econdary to known AF not on [facility]  Code Stroke CT head No acute abnormality. Probably hyperdense R MCA bifurcation. Old R temporal lobe and L frontal lobe infarcts. .     CTA head & neck suboptimal. Occlusion R MCA at bifurcation.  CT perfusion large delayed perfusion R MCA. No core infarct.   Cerebral angio occluded R M1, TICI2c   MRI  R MCA infarct (operculum). B MCA encephalomalacia  MRA  Improved R MCA patency  2D Echo EF 60-65%. No source of embolus   LDL 57   HgbA1c 5.5   HIV neg  Lovenox 40 mg sq daily for VTE prophylaxis  aspirin 81 mg daily prior to admission, off IV heparin d/t bleeding from penis, but resumed 11/26. Back on heparin post J tube placement - as no crushed pills, switch heparin IV to lovenox bid    Therapy recommendations:  CIR->family prefer SNF  Disposition:  Likely SNF on Monday.    Repeat COVID again Sunday in order to be discharged on Monday -> ordered  Atrial Fibrillation / SSS  Home anticoagulation:  not taking   Started on Xarelto 09/15/2018, documented not taking 04/23/2019 (family practice note)  off IV heparin d/t bleeding from penis, resumed 11/26. Back on heparin post J tube placement - as no crushed pills, switch heparin IV to lovenox bid    Bradycardia with pauses (2 seconds) - Dr Rory Percy saw 11/20 - pt asymptomatic - currently rate in 70's - decreased metoprolol dose.   Hypertension  Home meds:  Lisinopril 20 . Treated w/ cleviprex, now off. Labetalol prn  . BP goal < 180 . BP stable now    . On metoprolol 12.5 bid . Long-term BP goal normotensive  Hyperlipidemia  Home meds:  Prescribed lipitor - ? taking  LDL 57, goal < 70  Was on lipitor 40 -> off lipitor due to no liquid form of statin available for tube administration and pt NPO  Dysphagia . Secondary to stroke . NPO . Speech on board . J tube placed by surgery 12/1 (with lysis of adhesions) . Trickle TF started 12/2 and tolerating now at goal of 75 . Continue free water 100 Q6h . No crushed meds should be given via J tube - confirmed with surgery that meds cannot be given via J tube - meds will need to be oral solutions per tube.  Chronic pain syndrome  chronic B LBP w/ R sided sciatica  Pain from recent fall on narcotics at home.   Followed at [facility].  Also complains of J tube surgery incision site pain  Change oxycodone 10 mg every 4 hours as needed to 7.5 mg every 3 hours as needed  Tobacco abuse  Current smoker  Smoking cessation counseling provided  [name] willing to quit   Other Stroke Risk Factors  ETOH use, alcohol level <10, advised to drink no more than 2 drink(s) a day  Substance abuse - UDS positive for Amphetamines   Morbid Obesity, Body mass index [name], hx failed open gastroplasty in 1993,  recommend weight loss, diet and exercise as appropriate   Hx stroke/TIA by imaging - Old R temporal lobe and L frontal lobe infarcts - likely due to AFib  Obstructive sleep apnea, not on CPAP at home  PVD   Other Active Problems  Leukocytosis, resolved.   COPD  Hypokalemia, resolved  Adjustment d/o w/ mixed anxiety and depressed mood  LBP on oxycodone  PRN  Hospital day # 66   Personally examined patient and images, and have participated in and made any corrections needed to history, physical, neuro exam,assessment and plan as stated above.  I have personally obtained the history, evaluated lab date, reviewed imaging studies and agree with radiology interpretations.    Sarina Ill, MD Stroke Neurology   A total of 25 minutes was spent for the care of this patient, spent on counseling patient and family on different diagnostic and therapeutic options, counseling and coordination of care, riskd ans benefits of management, compliance, or risk factor reduction and education.    To contact Stroke Continuity provider, please refer to [email or web address] After hours, contact General Neurology

## 2019-05-21 LAB — GLUCOSE, CAPILLARY
Glucose-Capillary: 102 mg/dL — ABNORMAL HIGH (ref 70–99)
Glucose-Capillary: 102 mg/dL — ABNORMAL HIGH (ref 70–99)
Glucose-Capillary: 104 mg/dL — ABNORMAL HIGH (ref 70–99)
Glucose-Capillary: 105 mg/dL — ABNORMAL HIGH (ref 70–99)
Glucose-Capillary: 75 mg/dL (ref 70–99)
Glucose-Capillary: 92 mg/dL (ref 70–99)

## 2019-05-21 LAB — SARS CORONAVIRUS 2 (TAT 6-24 HRS): SARS Coronavirus 2: NEGATIVE

## 2019-05-21 NOTE — Progress Notes (Signed)
STROKE TEAM PROGRESS NOTE   INTERVAL HISTORY Pt in bed, spoke with nurse who reported he continues to ask for pain medication. He is asking me for more, he writes messages to communicate. I advised him again we are keeping a close watch on his pain and his nurse will be back in but he appears comfortable to me currently and any time I have seen himy. I answered all his questions, he used a pad to write. J tube feeding now at goal. He supposed to transfer to [facility] Friday however, unfortunately, the SNF he goes to had [place] breakout, he will stay until Monday.    Vitals:   05/21/19 0826 05/21/19 1209 05/21/19 1445 05/21/19 1628  BP:  127/64  117/76  Pulse:  (!) 103  72  Resp:  17  16  Temp:  97.9 F (36.6 C)  97.9 F (36.6 C)  TempSrc:  Oral  Oral  SpO2: 97% 100% 98% 97%  Weight:      Height:        CBC:  Recent Labs  Lab 05/19/19 0256 05/20/19 0249  WBC 9.3 8.9  HGB 14.7 14.7  HCT 44.0 45.8  MCV 84.1 86.1  PLT 249 99991111   Basic Metabolic Panel:  Recent Labs  Lab 05/19/19 0256 05/20/19 0249  NA 135 138  K 3.8 3.8  CL 99 100  CO2 25 28  GLUCOSE 93 101*  BUN 18 19  CREATININE 0.84 0.72  CALCIUM 9.1 9.1   Lipid Panel:     Component Value Date/Time   CHOL 102 05/01/2019 0630   CHOL 142 12/19/2014 0804   TRIG 98 05/01/2019 0630   HDL 25 (L) 05/01/2019 0630   HDL 29 (L) 12/19/2014 0804   CHOLHDL 4.1 05/01/2019 0630   VLDL 20 05/01/2019 0630   LDLCALC 57 05/01/2019 0630   LDLCALC 93 12/19/2014 0804   HgbA1c:  Lab Results  Component Value Date   HGBA1C 5.5 05/01/2019   Urine Drug Screen:     Component Value Date/Time   LABOPIA NONE DETECTED 04/30/2019 1507   COCAINSCRNUR NONE DETECTED 04/30/2019 1507   COCAINSCRNUR Negative 03/15/2015 1141   LABBENZ NONE DETECTED 04/30/2019 1507   AMPHETMU POSITIVE (A) 04/30/2019 1507   THCU NONE DETECTED 04/30/2019 1507   LABBARB NONE DETECTED 04/30/2019 1507    Alcohol Level     Component Value Date/Time   ETH <10  04/30/2019 1152    IMAGING past 24h No results found.  PHYSICAL EXAM   General - morbidly obese, well developed, not in acute distress.  Ophthalmologic - fundi not visualized due to noncooperation.  Cardiovascular - irregularly irregular heart rate and rhythm.  Neuro - awake alert, still anarthric, not able to answer orientation questions but able to write them out on a note bad, he is fully oriented. Able to repeat "I am here", very dysarthric but not able to repeat other sentences. Follows all simple commands PERRL, EOMI, blinking to visual threat bilaterally. Bilateral facial weakness but left seems more than right. Tongue protrusion difficulty. RUE 5/5, LUE 5-/5 proximal and 4+/5 finger grip. RLE 5/5. LLE 5-/5 proximal and distal. DTR 1+ and no babinski. Sensation symmetrical. Bilateral FTN intact. Gait not tested.    ASSESSMENT/PLAN Kyle [name] is a 63 y.o. male with history of a. Fib ( not anticoagulated) sick sinus syndrom, HTN, CVA, OSA ( not on CPAP) presenting with right side gaze and left arm paralysis.   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 Probably hyperdense R MCA bifurcation. Old R temporal lobe and L frontal lobe infarcts. .     CTA head & neck suboptimal. Occlusion R MCA at bifurcation.  CT perfusion large delayed perfusion R MCA. No core infarct.   Cerebral angio occluded R M1, TICI2c   MRI  R MCA infarct (operculum). B MCA encephalomalacia  MRA  Improved R MCA patency  2D Echo EF 60-65%. No source of embolus   LDL 57   HgbA1c 5.5   HIV neg  Lovenox 40 mg sq daily for VTE prophylaxis  aspirin 81 mg daily prior to admission, off IV heparin d/t bleeding from penis, but resumed 11/26. Back on heparin post J tube placement - as no crushed pills, switch heparin IV to lovenox bid    Therapy recommendations:  CIR->family  prefer SNF  Disposition:  Likely SNF on Monday.   Repeat COVID again Sunday in order to be discharged on Monday -> ordered  Atrial Fibrillation / SSS  Home anticoagulation:  not taking   Started on Xarelto 09/15/2018, documented not taking 04/23/2019 (family practice note)  off IV heparin d/t bleeding from penis, resumed 11/26. Back on heparin post J tube placement - as no crushed pills, switch heparin IV to lovenox bid    Bradycardia with pauses (2 seconds) - Dr Rory Percy saw 11/20 - pt asymptomatic - currently rate in 70's - decreased metoprolol dose.   Hypertension  Home meds:  Lisinopril 20 . Treated w/ cleviprex, now off. Labetalol prn  . BP goal < 180 . BP stable now    . On metoprolol 12.5 bid . Long-term BP goal normotensive  Hyperlipidemia  Home meds:  Prescribed lipitor - ? taking  LDL 57, goal < 70  Was on lipitor 40 -> off lipitor due to no liquid form of statin available for tube administration and pt NPO  Dysphagia . Secondary to stroke . NPO . Speech on board . J tube placed by surgery 12/1 (with lysis of adhesions) . Trickle TF started 12/2 and tolerating now at goal of 75 . Continue free water 100 Q6h . No crushed meds should be given via J tube - confirmed with surgery that meds cannot be given via J tube - meds will need to be oral solutions per tube.  Chronic pain syndrome  chronic B LBP w/ R sided sciatica  Pain from recent fall on narcotics at home.   Followed at [facility].  Also complains of J tube surgery incision site pain  Change oxycodone 10 mg every 4 hours as needed to 7.5 mg every 3 hours as needed  Tobacco abuse  Current smoker  Smoking cessation counseling provided  Pt is willing to quit   Other Stroke Risk Factors  ETOH use, alcohol level <10, advised to drink no more than 2 drink(s) a day  Substance abuse - UDS positive for Amphetamines   Morbid Obesity, Body mass index is 41.64  kg/m., hx failed open gastroplasty in 1993, recommend weight loss, diet and exercise as appropriate   Hx stroke/TIA by imaging - Old R temporal lobe and L frontal lobe infarcts - likely due to AFib  Obstructive sleep apnea, not on CPAP at home  PVD   Other Active Problems  Leukocytosis, resolved.   COPD  Hypokalemia, resolved  Adjustment  d/o w/ mixed anxiety and depressed mood  LBP on oxycodone PRN  Covid repeat for SNF - pending  Hospital day # 21   Personally examined patient and images, and have participated in and made any corrections needed to history, physical, neuro exam,assessment and plan as stated above.  I have personally obtained the history, evaluated lab date, reviewed imaging studies and agree with radiology interpretations.    Sarina Ill, MD Stroke Neurology   A total of 25 minutes was spent for the care of this patient, spent on counseling patient and family on different diagnostic and therapeutic options, counseling and coordination of care, riskd ans benefits of management, compliance, or risk factor reduction and education.   To contact Stroke Continuity provider, please refer to [email or web address] After hours, contact General Neurology

## 2019-05-21 NOTE — Progress Notes (Signed)
Patient c/o knot of hair in the back of his head; attempted shampoo cap and soap; the knot will not come out with the comb. Patient has written consent to cut the knot of hair.  Dr. Jaynee Eagles paged and consented to remove the knot of hair.  Second RN, Lannette Donath witnessed at the bedside. Small hair trim of the knot of hair removed.

## 2019-05-22 DIAGNOSIS — I6300 Cerebral infarction due to thrombosis of unspecified precerebral artery: Secondary | ICD-10-CM

## 2019-05-22 LAB — GLUCOSE, CAPILLARY
Glucose-Capillary: 107 mg/dL — ABNORMAL HIGH (ref 70–99)
Glucose-Capillary: 93 mg/dL (ref 70–99)
Glucose-Capillary: 100 mg/dL — ABNORMAL HIGH (ref 70–99)
Glucose-Capillary: 89 mg/dL (ref 70–99)
Glucose-Capillary: 110 mg/dL — ABNORMAL HIGH (ref 70–99)

## 2019-05-22 LAB — URINALYSIS, ROUTINE W REFLEX MICROSCOPIC
Bilirubin Urine: NEGATIVE
Glucose, UA: NEGATIVE mg/dL
Ketones, ur: NEGATIVE mg/dL
Nitrite: NEGATIVE
Protein, ur: 100 mg/dL — AB
RBC / HPF: >50 RBC/hpf — ABNORMAL HIGH (ref 0–5)
Specific Gravity, Urine: 1.024 (ref 1.005–1.030)
WBC, UA: >50 WBC/hpf — ABNORMAL HIGH (ref 0–5)
pH: 5 (ref 5.0–8.0)

## 2019-05-22 MED ORDER — ENOXAPARIN SODIUM 150 MG/ML ~~LOC~~ SOLN
1.0000 mg/kg | Freq: Two times a day (BID) | SUBCUTANEOUS | Status: DC
  Administered 2019-05-22 – 2019-05-23 (×3): 150 mg via SUBCUTANEOUS
  Filled 2019-05-22 (×4): qty 1

## 2019-05-22 NOTE — Progress Notes (Signed)
STROKE TEAM PROGRESS NOTE   INTERVAL HISTORY Patient is sitting up in bed.  He has slight bleeding around his jejunostomy tube site on his abdomen.  He was not transferred to skilled nursing facility as there was a Covid outbreak there.  Hopefully he will be transferred in the next couple of days.  Vital signs are stable.  No new complaints.  Also has minor bleeding around penis Vitals:   05/22/19 0742 05/22/19 0802 05/22/19 1136 05/22/19 1416  BP: 124/77  134/62   Pulse: 70  83   Resp: 15 14 16  (!) 21  Temp: 97.7 F (36.5 C)  97.6 F (36.4 C)   TempSrc: Oral  Oral   SpO2: 99% 99% 99% 98%  Weight:      Height:        CBC:  Recent Labs  Lab 05/19/19 0256 05/20/19 0249  WBC 9.3 8.9  HGB 14.7 14.7  HCT 44.0 45.8  MCV 84.1 86.1  PLT 249 99991111   Basic Metabolic Panel:  Recent Labs  Lab 05/19/19 0256 05/20/19 0249  NA 135 138  K 3.8 3.8  CL 99 100  CO2 25 28  GLUCOSE 93 101*  BUN 18 19  CREATININE 0.84 0.72  CALCIUM 9.1 9.1   Lipid Panel:     Component Value Date/Time   CHOL 102 05/01/2019 0630   CHOL 142 12/19/2014 0804   TRIG 98 05/01/2019 0630   HDL 25 (L) 05/01/2019 0630   HDL 29 (L) 12/19/2014 0804   CHOLHDL 4.1 05/01/2019 0630   VLDL 20 05/01/2019 0630   LDLCALC 57 05/01/2019 0630   LDLCALC 93 12/19/2014 0804   HgbA1c:  Lab Results  Component Value Date   HGBA1C 5.5 05/01/2019   Urine Drug Screen:     Component Value Date/Time   LABOPIA NONE DETECTED 04/30/2019 1507   COCAINSCRNUR NONE DETECTED 04/30/2019 1507   COCAINSCRNUR Negative 03/15/2015 1141   LABBENZ NONE DETECTED 04/30/2019 1507   AMPHETMU POSITIVE (A) 04/30/2019 1507   THCU NONE DETECTED 04/30/2019 1507   LABBARB NONE DETECTED 04/30/2019 1507    Alcohol Level     Component Value Date/Time   ETH <10 04/30/2019 1152    IMAGING past 24h No results found.  PHYSICAL EXAM    General - morbidly obese, well developed, not in acute distress.  Ophthalmologic - fundi not visualized  due to noncooperation.  Cardiovascular - irregularly irregular heart rate and rhythm. Abdomen mild reddish tinged discoloration of dressing around jejunostomy tube site  Neuro - awake alert, still anarthric, not able to answer orientation questions but able to write them out on a note bad, he is fully oriented. Able to repeat "I am here", very dysarthric but not able to repeat other sentences. Follows all simple commands PERRL, EOMI, blinking to visual threat bilaterally. Bilateral facial weakness but left seems more than right. Tongue protrusion difficulty. RUE 5/5, LUE 5-/5 proximal and 4+/5 finger grip. RLE 5/5. LLE 5-/5 proximal and distal. DTR 1+ and no babinski. Sensation symmetrical. Bilateral FTN intact. Gait not tested.    ASSESSMENT/PLAN Kyle [name] is a 63 y.o. male with history of a. Fib ( not anticoagulated) sick sinus syndrom, HTN, CVA, OSA ( not on CPAP) presenting with right side gaze and left arm paralysis.   Stroke: R MCA infarct due to right MCA occlusion s/p IR w/ TICI2c revascularization -  Embolic econdary to known AF not on [facility]  Code Stroke CT head No acute abnormality. Probably  hyperdense R MCA bifurcation. Old R temporal lobe and L frontal lobe infarcts. .     CTA head & neck suboptimal. Occlusion R MCA at bifurcation.  CT perfusion large delayed perfusion R MCA. No core infarct.   Cerebral angio occluded R M1, TICI2c   MRI  R MCA infarct (operculum). B MCA encephalomalacia  MRA  Improved R MCA patency  2D Echo EF 60-65%. No source of embolus   LDL 57   HgbA1c 5.5   HIV neg  Lovenox 40 mg sq daily for VTE prophylaxis  aspirin 81 mg daily prior to admission, off IV heparin d/t bleeding from penis, but resumed 11/26. Back on heparin post J tube placement - as no crushed pills, switch heparin IV to lovenox bid    Therapy recommendations:  CIR->family prefer SNF  Disposition:  SNF - CSW working on new facility for possible discharge   Repeat COVID  Sunday negative  Atrial Fibrillation / SSS  Home anticoagulation:  not taking   Started on Xarelto 09/15/2018, documented not taking 04/23/2019 (family practice note)  off IV heparin d/t bleeding from penis, resumed 11/26. Back on heparin post J tube placement - as no crushed pills, switched heparin IV to lovenox bid    Bradycardia with pauses (2 seconds) - Dr Rory Percy saw 11/20 - pt asymptomatic - currently rate in 70's - decreased metoprolol dose - rate  .   Hypertension  Home meds:  Lisinopril 20 . Treated w/ cleviprex, now off. Labetalol prn  . BP goal < 180 . BP stable now    . On metoprolol 12.5 bid    . Long-term BP goal normotensive  Hyperlipidemia  Home meds:  Prescribed lipitor - ? taking  LDL 57, goal < 70  Was on lipitor 40 -> off lipitor due to no liquid form of statin available for tube administration and pt NPO  Dysphagia . Secondary to stroke . NPO . Speech on board . J tube placed by surgery 12/1 (with lysis of adhesions) . Trickle TF started 12/2 and tolerating now at goal of 75 . Continue free water 100 Q6h . No crushed meds should be given via J tube - confirmed with surgery that meds cannot be given via J tube - meds will need to be oral solutions per tube. . Bleeding around j-tube site today. Asked surgery team to evaluate prior to d/c  Chronic pain syndrome  chronic B LBP w/ R sided sciatica  Pain from recent fall on narcotics at home.   Followed at [facility].  Also complains of J tube surgery incision site pain  Change oxycodone 10 mg every 4 hours as needed to 7.5 mg every 3 hours as needed  Tobacco abuse  Current smoker  Smoking cessation counseling provided  Pt is willing to quit   Other Stroke Risk Factors  ETOH use, alcohol level <10, advised to drink no more than 2 drink(s) a day  Substance abuse - UDS positive for Amphetamines   Morbid Obesity, Body mass index is 41.75 kg/m., hx  failed open gastroplasty in 1993, recommend weight loss, diet and exercise as appropriate   Hx stroke/TIA by imaging - Old R temporal lobe and L frontal lobe infarcts - likely due to AFib  Obstructive sleep apnea, not on CPAP at home  PVD   Other Active Problems  Leukocytosis, resolved.   COPD  Hypokalemia, resolved  Adjustment d/o w/ mixed anxiety and depressed mood  LBP  on oxycodone PRN  Pt reported blood from penis, had earlier in hospital stay which resolved. Will check UA & Cx  Hospital day # 22   Continue present treatment.  Will ask vascular surgery to evaluate jejunostomy site.  Monitor pain is bleeding for now.  Hopefully transfer to skilled nursing facility in the next few days when bed available.  Greater than 50% time during this 25-minute visit was spent in counseling and coordination of care and discussion with care team Antony Contras, MD  To contact Stroke Continuity provider, please refer to [email or web address] After hours, contact General Neurology

## 2019-05-22 NOTE — TOC Progression Note (Addendum)
Transition of Care [facility]) - Progression Note    Patient Details  Name: STEIN BURGOS MRN: [id number] Date of Birth: 29-Sep-1955  Transition of Care [facility]) CM/SW [place], [place] Phone Number: 05/22/2019, 10:52 AM  Clinical Narrative:     Update 1:20pm: Gerald Stabs from [facility] is able to start insurance authorization with [employer] Patient will need updated COVID before discharge.   CSW will continue to follow.   __________________________________  Patient's SNF facility, [facility] has had an outbreak of COVID and they no longer have a bed available for the patient. CSW spoke with admissions director,Tracy. She stated she did not know when a bed would become available.   CSW has faxed the patient out to additional facilities. CSW has spoken with Chantelle at [place] [facility] and left a message for Ty Ty at [facility]. CSW is awaiting confirmation of a new bed offer.   CSW will continue to follow and assist with discharge planning.    Expected Discharge Plan: [place] Barriers to Discharge: Continued Medical Work up, Ship broker  Expected Discharge Plan and Services Expected Discharge Plan: [place] arrangements for the past 2 months: Single Family Home Expected Discharge Date: 05/19/19                                     Social Determinants of Health (SDOH) Interventions    Readmission Risk Interventions No flowsheet data found.

## 2019-05-22 NOTE — Progress Notes (Signed)
Occupational Therapy Treatment Patient Details Name: Kyle [name] MRN: [id number] DOB: [birth year] Today's Date: 05/22/2019    History of present illness Kyle [name] is an 63 y.o. male  With PMH a. Fib ( not anticoagulated) sick sinus syndrom, HTN, CVA, OSA ( not on CPAP) who presented to [facility] as a code stroke. Pt found to have R MCA acute ischemic stroke due to right M1 occlusion, pt s/p mechanical thrombectomy.   OT comments  Upon arrival pt with pain level 10, requesting pain meds, RN arrived to administer pain medication. Pt agreeable to therapy session. He requires minguard-minA for functional mobility at RW level. Pt brushed his hair while sitting EOB, able to complete thoroughly. Pt completed grooming and toileting using urinal at sink level with minguard-minA. He organized and cleaned the sink while standing to work on attending to L side and dynamic level balance, min vc to locate items on L side, minguard-minA for stability. Pt will continue to benefit from skilled OT services to maximize safety and independence with ADL/IADL and functional mobility. Will continue to follow acutely and progress as tolerated.     Follow Up Recommendations  Supervision/Assistance - 24 hour;SNF    Equipment Recommendations  Other (comment)(TBD)    Recommendations for Other Services Rehab consult    Precautions / Restrictions Precautions Precautions: Fall Restrictions Weight Bearing Restrictions: No       Mobility Bed Mobility Overal bed mobility: Needs Assistance Bed Mobility: Supine to Sit     Supine to sit: Min assist     General bed mobility comments: minA to progress trunk to upright position  Transfers Overall transfer level: Needs assistance Equipment used: Rolling walker (2 wheeled) Transfers: Sit to/from Stand Sit to Stand: Min assist         General transfer comment: mina for stability and boost from EOB    Balance Overall balance assessment: Needs  assistance Sitting-balance support: Feet supported;Single extremity supported Sitting balance-Leahy Scale: Good Sitting balance - Comments: Able to functionally reach with supervision   Standing balance support: Bilateral upper extremity supported;During functional activity Standing balance-Leahy Scale: Fair Standing balance comment: dependent on RW                           ADL either performed or assessed with clinical judgement   ADL Overall ADL's : Needs assistance/impaired Eating/Feeding: NPO   Grooming: Minimal assistance;Standing;Wash/dry face;Brushing hair;Sitting Grooming Details (indicate cue type and reason): pt brushed hair sitting EOB;washed face standing at sink level with minguard -minA for stability in standing                 Toilet Transfer: Minimal assistance;RW Toilet Transfer Details (indicate cue type and reason): simulated with recliner Toileting- Clothing Manipulation and Hygiene: Minimal assistance Toileting - Clothing Manipulation Details (indicate cue type and reason): pt used urinal while standing at sink level     Functional mobility during ADLs: Minimal assistance;Rolling walker       Vision   Vision Assessment?: Vision impaired- to be further tested in functional context Additional Comments: pt appears to write over previously written words;he fluctuate with his sizing of his letters, 75% non legible, when prompted to write bigger letters words about 50% legible   Perception     Praxis      Cognition Arousal/Alertness: Awake/alert Behavior During Therapy: WFL for tasks assessed/performed Overall Cognitive Status: Within Functional Limits for tasks assessed  General Comments: Communicates with paper and clip board but appears with in normal limits.        Exercises     Shoulder Instructions       General Comments vss    Pertinent Vitals/ Pain       Pain Assessment:  0-10 Pain Score: 10-Worst pain ever Pain Location: abdomen at PEG site Pain Descriptors / Indicators: Guarding;Grimacing Pain Intervention(s): Monitored during session;RN gave pain meds during session;Patient requesting pain meds-RN notified  Home Living                                          Prior Functioning/Environment              Frequency  Min 2X/week        Progress Toward Goals  OT Goals(current goals can now be found in the care plan section)  Progress towards OT goals: Progressing toward goals  Acute Rehab OT Goals OT Goal Formulation: With patient Time For Goal Achievement: 05/29/19 Potential to Achieve Goals: Good ADL Goals Pt Will Perform Grooming: standing;with supervision Pt Will Perform Lower Body Dressing: sitting/lateral leans;sit to/from stand;with supervision Pt Will Transfer to Toilet: with supervision;ambulating Additional ADL Goal #1: Pt will complete multistep ADL locating all items independently.  Additional ADL Goal #2: Pt will follow multistep command consistently during ADL completion  Plan Discharge plan remains appropriate;Frequency remains appropriate    Co-evaluation    PT/OT/SLP Co-Evaluation/Treatment: Yes Reason for Co-Treatment: For patient/therapist safety;To address functional/ADL transfers   OT goals addressed during session: ADL's and self-care      AM-PAC OT "6 Clicks" Daily Activity     Outcome Measure   Help from another person eating meals?: Total Help from another person taking care of personal grooming?: A Little Help from another person toileting, which includes using toliet, bedpan, or urinal?: A Little Help from another person bathing (including washing, rinsing, drying)?: A Lot Help from another person to put on and taking off regular upper body clothing?: A Little Help from another person to put on and taking off regular lower body clothing?: Total 6 Click Score: 13    End of Session  Equipment Utilized During Treatment: Rolling walker  OT Visit Diagnosis: Unsteadiness on feet (R26.81);Other abnormalities of gait and mobility (R26.89);Hemiplegia and hemiparesis;Cognitive communication deficit (R41.841);Other symptoms and signs involving cognitive function Symptoms and signs involving cognitive functions: Cerebral infarction Hemiplegia - Right/Left: Left Hemiplegia - dominant/non-dominant: Non-Dominant Hemiplegia - caused by: Cerebral infarction   Activity Tolerance Patient tolerated treatment well   Patient Left with call bell/phone within reach;in chair;with chair alarm set   Nurse Communication Mobility status        Time: 1334-1410 OT Time Calculation (min): 36 min  Charges: OT General Charges $OT Visit: 1 Visit OT Treatments $Self Care/Home Management : 8-22 mins  Dorinda Hill OTR/L Acute Rehabilitation Services Office: [place] 05/22/2019, 2:56 PM

## 2019-05-22 NOTE — Progress Notes (Signed)
Physical Therapy Treatment Patient Details Name: Kyle [name] MRN: [id number] DOB: [birth year] Today's Date: 05/22/2019    History of Present Illness Kyle [name] is an 63 y.o. male  With PMH a. Fib ( not anticoagulated) sick sinus syndrom, HTN, CVA, OSA ( not on CPAP) who presented to [facility] as a code stroke. Pt found to have R MCA acute ischemic stroke due to right M1 occlusion, pt s/p mechanical thrombectomy.    PT Comments    Pt supine in bed on arrival.  Continues to use pad of paper to communicate.  He reports pain in PEG tube site as 10/10.  He also complains of pain in B arthritic knees.  His pain and poor endurance limit activity.  He performed short bout of gt training and ADLs at sink level before requiring to sit.  Pt continues to be limited functionally and will continue to benefit from skilled rehab in a post acute setting.      Follow Up Recommendations  SNF     Equipment Recommendations  (TBD at next venue)    Recommendations for Other Services Rehab consult     Precautions / Restrictions Precautions Precautions: Fall Precaution Comments: strong L lateral lean Restrictions Weight Bearing Restrictions: No    Mobility  Bed Mobility Overal bed mobility: Needs Assistance Bed Mobility: Supine to Sit;Sit to Supine     Supine to sit: Min assist;+2 for safety/equipment     General bed mobility comments: minA to progress trunk to upright position, utilized OTs hand for rail to pull into upright position.  Transfers Overall transfer level: Needs assistance Equipment used: Rolling walker (2 wheeled) Transfers: Sit to/from Stand Sit to Stand: Min assist;+2 safety/equipment         General transfer comment: mina for stability and boost from EOB,  Pt with wide base and rocking momentum to achieve standing.  Good hand placement from stand to sit.  Ambulation/Gait Ambulation/Gait assistance: Min guard;+2 safety/equipment Gait Distance (Feet): 8 Feet Assistive  device: Rolling walker (2 wheeled)(bariatric) Gait Pattern/deviations: Wide base of support;Shuffle;Step-to pattern;Trunk flexed Gait velocity: very decreased   General Gait Details: Continues with slow shuffling wide base with poor B foot clearance.  Anterior lean over RW.  He stood at sink for ADLs after gt session but fatigues with standing and required return to a seated position.   Stairs             Wheelchair Mobility    Modified Rankin (Stroke Patients Only) Modified Rankin (Stroke Patients Only) Pre-Morbid Rankin Score: No significant disability Modified Rankin: Moderately severe disability     Balance Overall balance assessment: Needs assistance Sitting-balance support: Feet supported;Single extremity supported Sitting balance-Leahy Scale: Good Sitting balance - Comments: Able to functionally reach with supervision       Standing balance comment: dependent on RW and UE support on sink                            Cognition Arousal/Alertness: Awake/alert Behavior During Therapy: WFL for tasks assessed/performed Overall Cognitive Status: Within Functional Limits for tasks assessed                                 General Comments: Communicates with paper and clip board but appears with in normal limits.      Exercises      General Comments  Pertinent Vitals/Pain Pain Assessment: 0-10 Pain Score: 10-Worst pain ever Pain Location: abdomen at PEG site Pain Descriptors / Indicators: Guarding;Grimacing Pain Intervention(s): Monitored during session;RN gave pain meds during session;Patient requesting pain meds-RN notified    Home Living                      Prior Function            PT Goals (current goals can now be found in the care plan section) Acute Rehab PT Goals PT Goal Formulation: With patient Time For Goal Achievement: 05/29/19(goals updated as patient is progressing) Potential to Achieve Goals:  Fair Progress towards PT goals: Progressing toward goals    Frequency    Min 3X/week      PT Plan Current plan remains appropriate    Co-evaluation PT/OT/SLP Co-Evaluation/Treatment: Yes Reason for Co-Treatment: To address functional/ADL transfers;Other (comment)(pt cannot tolerate multiple sessions.)          AM-PAC PT "6 Clicks" Mobility   Outcome Measure  Help needed turning from your back to your side while in a flat bed without using bedrails?: A Little Help needed moving from lying on your back to sitting on the side of a flat bed without using bedrails?: A Little Help needed moving to and from a bed to a chair (including a wheelchair)?: A Little Help needed standing up from a chair using your arms (e.g., wheelchair or bedside chair)?: A Little Help needed to walk in hospital room?: A Little Help needed climbing 3-5 steps with a railing? : A Lot 6 Click Score: 17    End of Session Equipment Utilized During Treatment: Gait belt Activity Tolerance: Patient tolerated treatment well;Patient limited by pain Patient left: in chair;with call bell/phone within reach Nurse Communication: Mobility status PT Visit Diagnosis: Difficulty in walking, not elsewhere classified (R26.2);Muscle weakness (generalized) (M62.81)     Time: [id number] PT Time Calculation (min) (ACUTE ONLY): 34 min  Charges:  $Gait Training: 8-22 mins                     Kyle [name] , PTA Acute Rehabilitation Services Pager [phone] Office [phone]     Kyle [name] 05/22/2019, 6:13 PM

## 2019-05-22 NOTE — Plan of Care (Signed)
Goals updated today as patient is progressing.  Problem: Acute Rehab PT Goals(only PT should resolve) Goal: Pt Will Go Supine/Side To Sit Flowsheets (Taken 05/22/2019 1628) Pt will go Supine/Side to Sit: with supervision Goal: Patient Will Perform Sitting Balance Flowsheets (Taken 05/22/2019 1628) Patient will perform sitting balance: with supervision Goal: Patient Will Transfer Sit To/From Stand Flowsheets (Taken 05/22/2019 [id number]) Patient will transfer sit to/from stand: with supervision Goal: Pt Will Transfer Bed To Chair/Chair To Bed Flowsheets (Taken 05/22/2019 1627) Pt will Transfer Bed to Chair/Chair to Bed: with supervision

## 2019-05-23 LAB — GLUCOSE, CAPILLARY
Glucose-Capillary: 97 mg/dL (ref 70–99)
Glucose-Capillary: 96 mg/dL (ref 70–99)
Glucose-Capillary: 93 mg/dL (ref 70–99)
Glucose-Capillary: 111 mg/dL — ABNORMAL HIGH (ref 70–99)
Glucose-Capillary: 88 mg/dL (ref 70–99)
Glucose-Capillary: 115 mg/dL — ABNORMAL HIGH (ref 70–99)

## 2019-05-23 LAB — URINE CULTURE: Culture: <10000 — AB

## 2019-05-23 LAB — HEPARIN ANTI-XA: Heparin LMW: 1.24 IU/mL

## 2019-05-23 MED ORDER — ENOXAPARIN SODIUM 150 MG/ML ~~LOC~~ SOLN: 1.0000 mg/kg | Freq: Two times a day (BID) | SUBCUTANEOUS | Status: DC

## 2019-05-23 MED ORDER — METOPROLOL TARTRATE 25 MG/10 ML ORAL SUSPENSION: 12.5000 mg | Freq: Two times a day (BID) | ORAL | Status: DC

## 2019-05-23 NOTE — Plan of Care (Signed)
  Problem: Education: Goal: Knowledge of General Education information will improve Description Including pain rating scale, medication(s)/side effects and non-pharmacologic comfort measures Outcome: Progressing   Problem: Health Behavior/Discharge Planning: Goal: Ability to manage health-related needs will improve Outcome: Progressing   Problem: Clinical Measurements: Goal: Ability to maintain clinical measurements within normal limits will improve Outcome: Progressing Goal: Will remain free from infection Outcome: Progressing Goal: Diagnostic test results will improve Outcome: Progressing Goal: Respiratory complications will improve Outcome: Progressing Goal: Cardiovascular complication will be avoided Outcome: Progressing   Problem: Activity: Goal: Risk for activity intolerance will decrease Outcome: Progressing   Problem: Nutrition: Goal: Adequate nutrition will be maintained Outcome: Progressing   Problem: Coping: Goal: Level of anxiety will decrease Outcome: Progressing   Problem: Elimination: Goal: Will not experience complications related to bowel motility Outcome: Progressing Goal: Will not experience complications related to urinary retention Outcome: Progressing   Problem: Pain Managment: Goal: General experience of comfort will improve Outcome: Progressing   Problem: Safety: Goal: Ability to remain free from injury will improve Outcome: Progressing   Problem: Skin Integrity: Goal: Risk for impaired skin integrity will decrease Outcome: Progressing   Problem: Education: Goal: Knowledge of disease or condition will improve Outcome: Progressing Goal: Knowledge of secondary prevention will improve Outcome: Progressing Goal: Knowledge of patient specific risk factors addressed and post discharge goals established will improve Outcome: Progressing Goal: Individualized Educational Video(s) Outcome: Progressing   Problem: Coping: Goal: Will verbalize  positive feelings about self Outcome: Progressing Goal: Will identify appropriate support needs Outcome: Progressing   Problem: Health Behavior/Discharge Planning: Goal: Ability to manage health-related needs will improve Outcome: Progressing   Problem: Self-Care: Goal: Ability to participate in self-care as condition permits will improve Outcome: Progressing Goal: Verbalization of feelings and concerns over difficulty with self-care will improve Outcome: Progressing Goal: Ability to communicate needs accurately will improve Outcome: Progressing   Problem: Nutrition: Goal: Risk of aspiration will decrease Outcome: Progressing Goal: Dietary intake will improve Outcome: Progressing   Problem: Ischemic Stroke/TIA Tissue Perfusion: Goal: Complications of ischemic stroke/TIA will be minimized Outcome: Progressing  Francy Mcilvaine, BSN, RN 

## 2019-05-23 NOTE — Progress Notes (Signed)
   05/23/19 2105  Provider Notification  Provider Name/Title Dr Leonel Ramsay  Date Provider Notified 05/23/19  Time Provider Notified 2105  Notification Type Page  Notification Reason Other (Comment) (Pt accepting SNF not anwsering phone call tonight)  Response See new orders  Date of Provider Response 05/23/19  Time of Provider Response 2105

## 2019-05-23 NOTE — TOC Transition Note (Signed)
Transition of Care [facility]) - CM/SW Discharge Note   Patient Details  Name: Kyle [name] MRN: [id number] Date of Birth: 1955-08-22  Transition of Care [facility]) CM/SW Contact:  Geralynn Ochs, LCSW Phone Number: 05/23/2019, 4:52 PM   Clinical Narrative:   Nurse to call report to [phone], Room 516.    Final next level of care: [place] Barriers to Discharge: Barriers Resolved   Patient Goals and CMS Choice Patient states their goals for this hospitalization and ongoing recovery are:: patient unable to state goals due to confusion      Discharge Placement              Patient [facility] bed at: [facility] Patient to be transferred to facility by: [place] Name of family member notified: Legrand Como Patient and family notified of of transfer: 05/23/19  Discharge Plan and Services                                     Social Determinants of Health (SDOH) Interventions     Readmission Risk Interventions No flowsheet data found.

## 2019-05-23 NOTE — Progress Notes (Signed)
Informed nursing home does not accept patients after 9pm and therefore patient cannot be transported tonight. Ok to stay until the facility can accept the patient.   Roland Rack, MD [facility] [phone]  If 7pm- 7am, please page neurology on call as listed in [place].

## 2019-05-23 NOTE — Plan of Care (Signed)
  Problem: Education: Goal: Knowledge of General Education information will improve Description Including pain rating scale, medication(s)/side effects and non-pharmacologic comfort measures Outcome: Progressing   Problem: Health Behavior/Discharge Planning: Goal: Ability to manage health-related needs will improve Outcome: Progressing   Problem: Clinical Measurements: Goal: Ability to maintain clinical measurements within normal limits will improve Outcome: Progressing Goal: Will remain free from infection Outcome: Progressing Goal: Diagnostic test results will improve Outcome: Progressing Goal: Respiratory complications will improve Outcome: Progressing Goal: Cardiovascular complication will be avoided Outcome: Progressing   Problem: Activity: Goal: Risk for activity intolerance will decrease Outcome: Progressing   Problem: Nutrition: Goal: Adequate nutrition will be maintained Outcome: Progressing   Problem: Coping: Goal: Level of anxiety will decrease Outcome: Progressing   Problem: Elimination: Goal: Will not experience complications related to bowel motility Outcome: Progressing Goal: Will not experience complications related to urinary retention Outcome: Progressing   Problem: Pain Managment: Goal: General experience of comfort will improve Outcome: Progressing   Problem: Safety: Goal: Ability to remain free from injury will improve Outcome: Progressing   Problem: Skin Integrity: Goal: Risk for impaired skin integrity will decrease Outcome: Progressing   Problem: Education: Goal: Knowledge of disease or condition will improve Outcome: Progressing Goal: Knowledge of secondary prevention will improve Outcome: Progressing Goal: Knowledge of patient specific risk factors addressed and post discharge goals established will improve Outcome: Progressing Goal: Individualized Educational Video(s) Outcome: Progressing   Problem: Coping: Goal: Will verbalize  positive feelings about self Outcome: Progressing Goal: Will identify appropriate support needs Outcome: Progressing   Problem: Health Behavior/Discharge Planning: Goal: Ability to manage health-related needs will improve Outcome: Progressing   Problem: Self-Care: Goal: Ability to participate in self-care as condition permits will improve Outcome: Progressing Goal: Verbalization of feelings and concerns over difficulty with self-care will improve Outcome: Progressing Goal: Ability to communicate needs accurately will improve Outcome: Progressing   Problem: Nutrition: Goal: Risk of aspiration will decrease Outcome: Progressing Goal: Dietary intake will improve Outcome: Progressing   Problem: Ischemic Stroke/TIA Tissue Perfusion: Goal: Complications of ischemic stroke/TIA will be minimized Outcome: Progressing  Angelyne Terwilliger, BSN, RN 

## 2019-05-23 NOTE — TOC Progression Note (Signed)
Transition of Care [facility]) - Progression Note    Patient Details  Name: Kyle [name] MRN: [id number] Date of Birth: 08-25-1955  Transition of Care [facility]) CM/SW [place], [place] Work Phone Number: 05/23/2019, 2:57 PM  Clinical Narrative:    MSW Intern was informed that PT would like to speak with SW. MSW Intern went into the room and met with PT. PT does not speak, but writes to communicate. PT informed MSW Intern that he is concerned about his sister, Anderson Malta, who is 62 and is disabled, he says she cannot read or write. He states that he wants to get in contact with her, to tell her that he is alright, but his other sister, Tessie Fass, won't communicate with him. He says he has not spoken to [place] in three years and she is currently living with [facility], he wants to give her christmas presents so she knows he is ok. He wants to see her face to face.  MSW Intern spoke with PT for a while and asked about contact information, if he could write a letter, if he was worried for his sister's safety. He does not know how to contact his sisters and just generally seems to have anxiety about her. He also asked for an update about his placement. MSW Intern told him that sw would work on his concerns. He stated that if he did not get in contact, he would have to go home. MSW Intern called PT's cousin, Legrand Como, to follow up with PT's concern. Legrand Como states that he is unsure if Anderson Malta is alive, he knows that Tessie Fass is. He says he is trying to get in contact with her, but is currently unable to. He also states that he has PT's car, which was another concern. Legrand Como was also updated about PT's discharge and wants to be kept in the loop. MSW Intern will follow up with PT and SW will continue to follow.  Expected Discharge Plan: [place] Barriers to Discharge: Continued Medical Work up, Ship broker  Expected Discharge Plan and Services Expected Discharge Plan:  [place] arrangements for the past 2 months: Single Family Home Expected Discharge Date: 05/19/19                                     Social Determinants of Health (SDOH) Interventions    Readmission Risk Interventions No flowsheet data found.

## 2019-05-24 LAB — GLUCOSE, CAPILLARY
Glucose-Capillary: 106 mg/dL — ABNORMAL HIGH (ref 70–99)
Glucose-Capillary: 119 mg/dL — ABNORMAL HIGH (ref 70–99)
Glucose-Capillary: 91 mg/dL (ref 70–99)

## 2019-05-24 MED ORDER — ENOXAPARIN SODIUM 150 MG/ML ~~LOC~~ SOLN: 130.0000 mg | Freq: Two times a day (BID) | SUBCUTANEOUS | Status: DC

## 2019-05-24 MED ORDER — ALBUTEROL SULFATE (2.5 MG/3ML) 0.083% IN NEBU: 3.0000 mL | INHALATION_SOLUTION | Freq: Two times a day (BID) | RESPIRATORY_TRACT | Status: DC

## 2019-05-24 MED ORDER — ENOXAPARIN SODIUM 150 MG/ML ~~LOC~~ SOLN
130.0000 mg | Freq: Two times a day (BID) | SUBCUTANEOUS | Status: DC
  Administered 2019-05-24: 130 mg via SUBCUTANEOUS
  Filled 2019-05-24: qty 0.87

## 2019-05-24 MED ORDER — ALBUTEROL SULFATE (2.5 MG/3ML) 0.083% IN NEBU: 2.5000 mg | INHALATION_SOLUTION | RESPIRATORY_TRACT | Status: DC | PRN

## 2019-05-24 NOTE — TOC Transition Note (Signed)
Transition of Care [facility]) - CM/SW Discharge Note   Patient Details  Name: Kyle [name] MRN: [id number] Date of Birth: Sep 23, 1955  [facility]) CM/SW Contact:  Geralynn Ochs, LCSW Phone Number: 05/24/2019, 8:47 AM   Clinical Narrative:   Nurse to call report to [phone], Room 516.    Final next level of care: [place] Barriers to Discharge: Barriers Resolved   Patient Goals and CMS Choice Patient states their goals for this hospitalization and ongoing recovery are:: patient unable to state goals due to confusion      Discharge Placement              Patient chooses bed at: [facility] Patient to be transferred to facility by: [place] Name of family member notified: Legrand Como Patient and family notified of of transfer: 05/23/19  Discharge Plan and Services                                     Social Determinants of Health (SDOH) Interventions     Readmission Risk Interventions No flowsheet data found.

## 2019-05-24 NOTE — Progress Notes (Signed)
ANTICOAGULATION CONSULT NOTE - Initial Consult  Pharmacy Consult for Lovenox  Indication: atrial fibrillation  Allergies  Allergen Reactions  . Vioxx [Rofecoxib] Rash    Patient Measurements: Height: 6\' 3"  (190.5 cm) Weight: (!) 332 lb 14.3 oz (151 kg) IBW/kg (Calculated) : 84.5  Vital Signs: Temp: 98.2 F (36.8 C) (12/09 0017) Temp Source: Oral (12/09 0017) BP: 105/65 (12/09 0017) Pulse Rate: 76 (12/09 0017)  Labs: Recent Labs    05/23/19 2148  HEPRLOWMOCWT 1.24    Estimated Creatinine Clearance: 152.4 mL/min (by C-G formula based on SCr of 0.72 mg/dL).   Medical History: Past Medical History:  Diagnosis Date  . Arthritis   . Atrial fibrillation    HX OF SICK SINUS SYNDROME-ATRIAL FIB  . Back pain, chronic    PT STATES 5 BULGING DISKS AND PINCHED NERVES--PT ON OXYCODONE 4 TIMES A DAY FOR HIS PAIN  . Chest pain 08/26/2015  . Chronic airway obstruction, not elsewhere classified 12/21/2012  . GERD (gastroesophageal reflux disease)   . Hypertension   . Impaired fasting glucose 04/19/2013  . Peripheral vascular disease (HCC)    CHRONIC VENOUS INSUFFICIENCY  . Shortness of breath   . 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 Stroke ([place])   . Wears dentures     Assessment: 63 y/o M on Lovenox for atrial fibrillation. LMWH level was checked due to obesity and some recent minor bleeding.   LMWH level is above goal at 1.24   Goal of Therapy:  Anti-Xa level 0.6-1 units/ml 4hrs after LMWH dose given Monitor platelets by anticoagulation protocol: Yes   Plan:  Decrease Lovenox to 130 mg subcutaneous q12h Re-check LMWH level as needed Continue to monitor closely for bleeding  Narda Bonds, PharmD, BCPS Clinical Pharmacist Phone: [phone]

## 2019-05-24 NOTE — Progress Notes (Signed)
NURSING PROGRESS NOTE  Kyle [name] [place]:[id number] Discharge Data: 05/24/2019 11:15 AM Attending Provider: Garvin Fila, MD [id number], Sami, MD     [facility] discharged per MD order to the [facility]. Pt transported via stretcher by [facility].  After Visit Summary sent in packet with patient to facility and all questions fully answered. All IV's discontinued with no bleeding noted. All belongings returned to patient for patient to take to the facility.   Last Vital Signs:  Blood pressure 119/77, pulse 82, temperature 98.1 F (36.7 C), temperature source Oral, resp. rate 20, height 6\' 3"  (1.905 m), weight (!) 137.4 kg, SpO2 97 %.  Discharge Medication List Allergies as of 05/24/2019      Reactions   Vioxx [rofecoxib] Rash      Medication List    STOP taking these medications   amitriptyline 50 MG tablet Commonly known as: ELAVIL   aspirin EC 81 MG tablet   atorvastatin 20 MG tablet Commonly known as: LIPITOR   atorvastatin 40 MG tablet Commonly known as: LIPITOR   gabapentin 600 MG tablet Commonly known as: NEURONTIN Replaced by: gabapentin 250 MG/5ML solution   lisinopril 10 MG tablet Commonly known as: ZESTRIL   lisinopril 20 MG tablet Commonly known as: ZESTRIL   metoprolol succinate 100 MG 24 hr tablet Commonly known as: TOPROL-XL   nystatin cream Commonly known as: MYCOSTATIN   oxyCODONE 5 MG immediate release tablet Commonly known as: Oxy IR/ROXICODONE Replaced by: oxyCODONE 5 MG/5ML solution   pantoprazole 40 MG tablet Commonly known as: PROTONIX Replaced by: pantoprazole sodium 40 mg/20 mL Pack   PARoxetine 10 MG tablet Commonly known as: PAXIL     TAKE these medications   albuterol 108 (90 Base) MCG/ACT inhaler Commonly known as: VENTOLIN HFA Inhale 2 puffs into the lungs every 4 (four) hours as needed for wheezing or shortness of breath.   enoxaparin 150 MG/ML injection Commonly known as: LOVENOX Inject 0.87 mLs (130 mg total)  into the skin every 12 (twelve) hours.   feeding supplement (JEVITY 1.2 CAL) Liqd Place 1,000 mLs into feeding tube continuous.   feeding supplement (PRO-STAT SUGAR FREE 64) Liqd Place 30 mLs into feeding tube 3 (three) times daily.   free water Soln Place 100 mLs into feeding tube every 6 (six) hours.   gabapentin 250 MG/5ML solution Commonly known as: NEURONTIN Place 12 mLs (600 mg total) into feeding tube 4 (four) times daily. Replaces: gabapentin 600 MG tablet   metoprolol tartrate 25 mg/10 mL Susp Commonly known as: LOPRESSOR Place 5 mLs (12.5 mg total) into feeding tube 2 (two) times daily.   oxyCODONE 5 MG/5ML solution Commonly known as: ROXICODONE Place 7.5 mLs (7.5 mg total) into feeding tube every 3 (three) hours as needed for severe pain or breakthrough pain. Replaces: oxyCODONE 5 MG immediate release tablet   pantoprazole sodium 40 mg/20 mL Pack Commonly known as: PROTONIX Place 20 mLs (40 mg total) into feeding tube daily. Replaces: pantoprazole 40 MG tablet

## 2019-06-05 ENCOUNTER — Other Ambulatory Visit: Payer: Self-pay

## 2019-06-05 ENCOUNTER — Ambulatory Visit (HOSPITAL_COMMUNITY)
Admission: RE | Admit: 2019-06-05 | Discharge: 2019-06-05 | Disposition: A | Discharge status: Home / Self Care | Payer: Medicare PPO | Source: Ambulatory Visit | Attending: Interventional Radiology | Admitting: Interventional Radiology

## 2019-06-05 ENCOUNTER — Other Ambulatory Visit (HOSPITAL_COMMUNITY): Payer: Self-pay | Admitting: Interventional Radiology

## 2019-06-05 DIAGNOSIS — R633 Feeding difficulties, unspecified: Secondary | ICD-10-CM

## 2019-06-05 DIAGNOSIS — Z434 Encounter for attention to other artificial openings of digestive tract: Secondary | ICD-10-CM | POA: Insufficient documentation

## 2019-06-05 HISTORY — PX: IR REPLC DUODEN/JEJUNO TUBE PERCUT W/FLUORO: IMG2334

## 2019-06-05 IMAGING — XA IR REPLC DUODEN/JEJUNO TUBE PERCUT W/FLUORO
1 series · 3 of 3 positions shown · non-contrast
Comparison: none

INDICATION: Post stroke, with recent surgically placed jejunostomy catheter,
became removed. A temporary catheter placed in the tract has also
been removed.

[Series 300: ir replc duoden/jejuno tube percut w/flu · 3 of 3 slices shown]
[im 1/3]
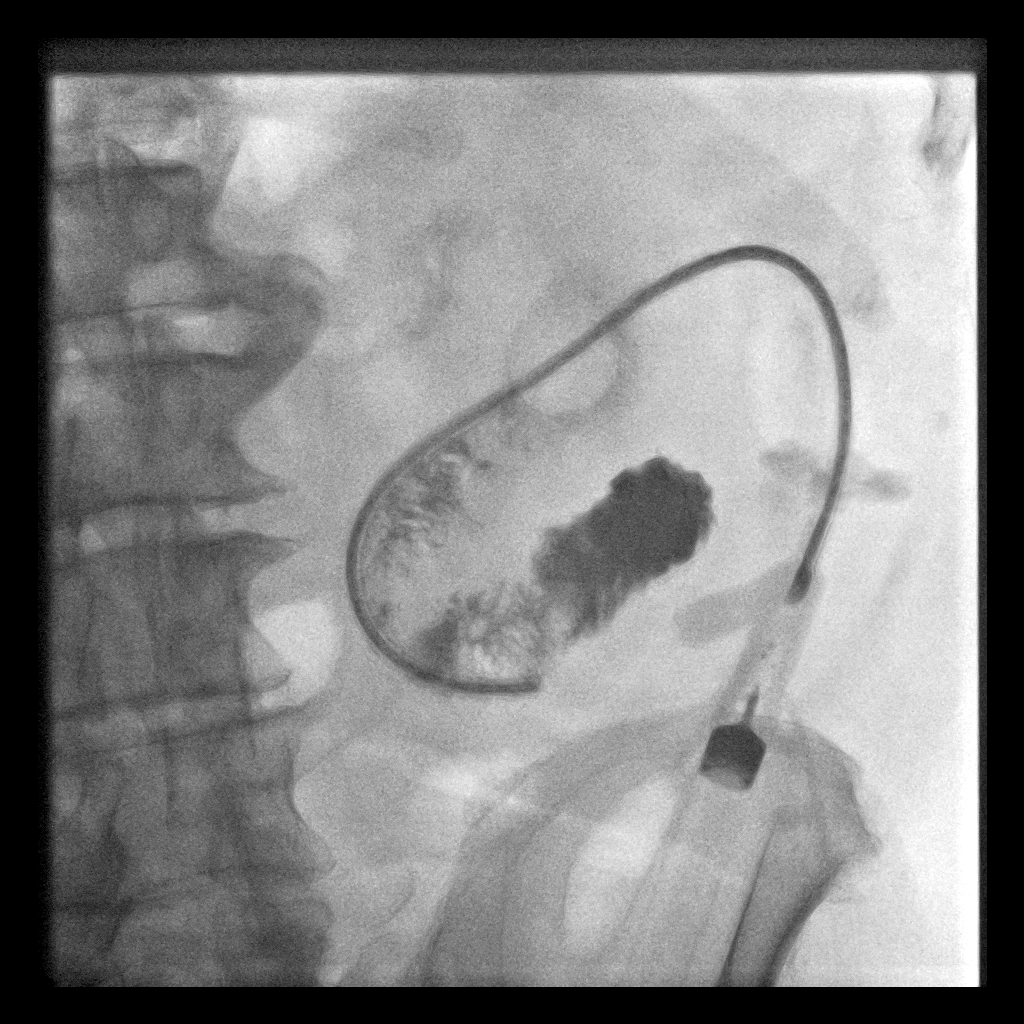
[im 2/3]
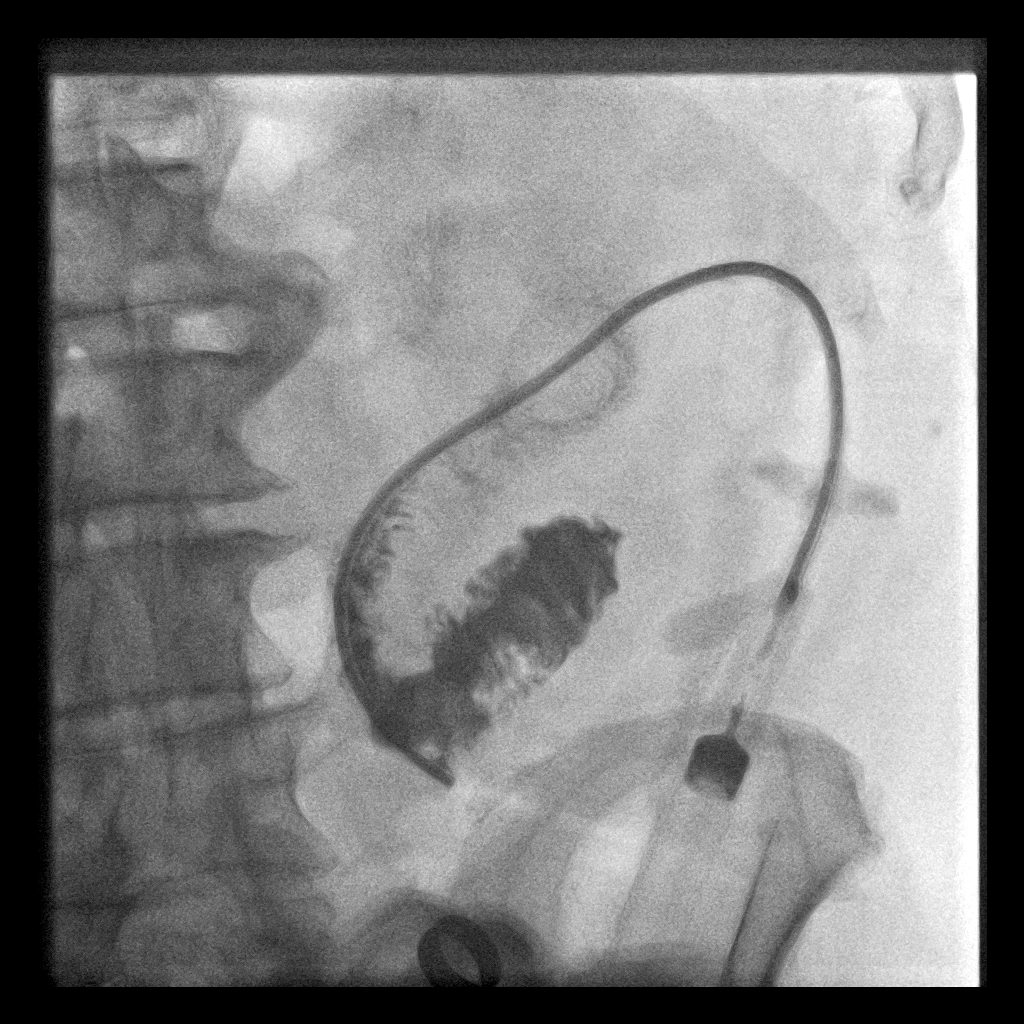
[im 3/3]
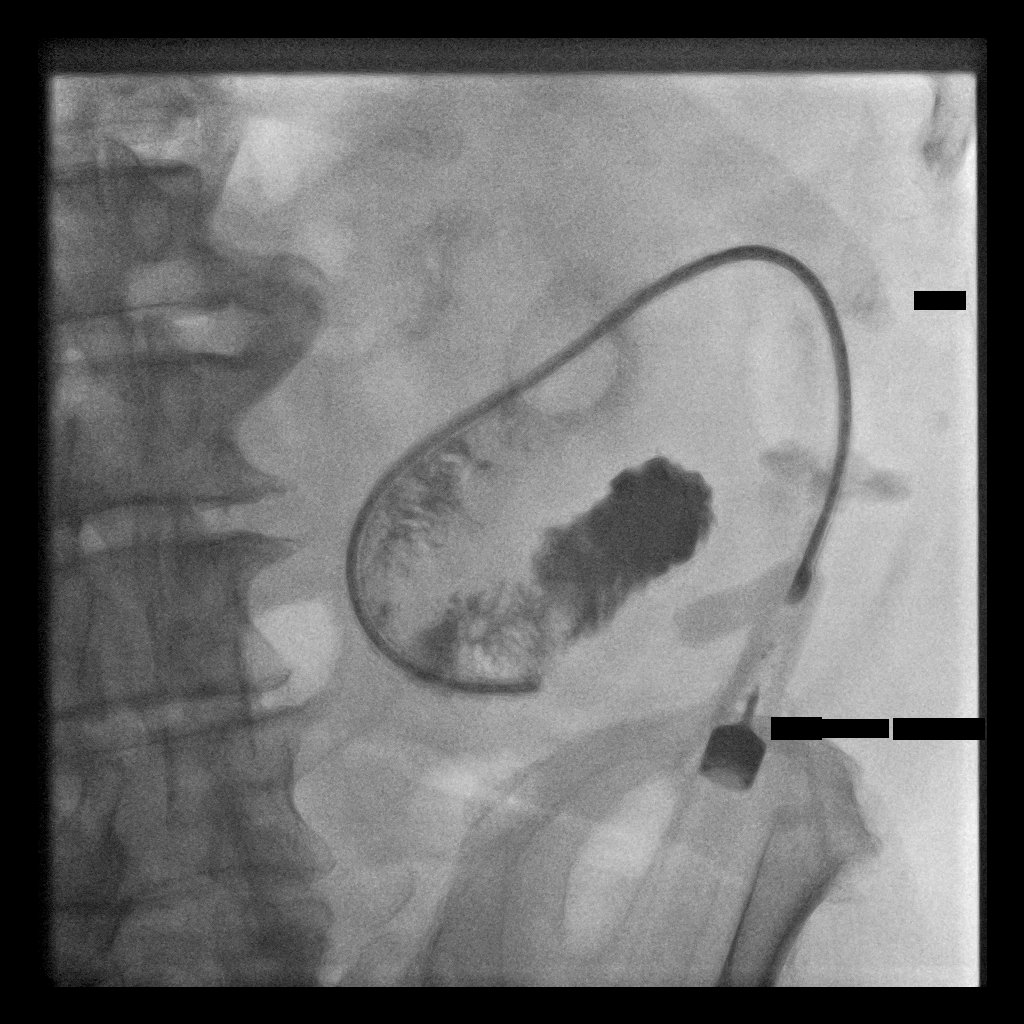

[3 of 3 positions shown; findings below may reference images not displayed]

EXAM:
IR REPLACE DUODEN/JEJUNO TUBE PERCUT WITH FLUORO

MEDICATIONS:
No antibiotics were indicated

ANESTHESIA/SEDATION:
Viscous lidocaine, topically

CONTRAST:  20mL OMNIPAQUE IOHEXOL 300 MG/ML SOLN - administered into
the gastric lumen.

PROCEDURE:
Informed written consent was obtained from the patient after a
thorough discussion of the procedural risks, benefits and
alternatives. All questions were addressed. Maximal Sterile Barrier
Technique was utilized including caps, mask, sterile gowns, sterile
gloves, sterile drape, hand hygiene and skin antiseptic. A timeout
was performed prior to the initiation of the procedure.

Under fluoroscopic guidance, a short angled Kumpe catheter was
advanced through the jejunostomy tract into the small bowel.
Contrast injection confirmed appropriate positioning. Catheter
exchanged over short Amplatz wire for vascular dilator which
facilitated placement 16 French balloon retention single-lumen
jejunostomy catheter. Small contrast injection confirmed appropriate
positioning and patency. Catheter was flushed and capped. The
patient tolerated the procedure well.

FLUOROSCOPY TIME:  1.9 minute; 453 [NX] DAP

COMPLICATIONS:
None immediate.
IMPRESSION: 1. Technically successful 16 French single-lumen jejunostomy
catheter replacement under fluoroscopy.

## 2019-06-05 MED ORDER — IOHEXOL 300 MG/ML  SOLN
50.0000 mL | Freq: Once | INTRAMUSCULAR | Status: AC | PRN
  Administered 2019-06-05: 20 mL

## 2019-06-05 MED ORDER — LIDOCAINE VISCOUS HCL 2 % MT SOLN
OROMUCOSAL | Status: AC
  Filled 2019-06-05: qty 15

## 2019-06-08 ENCOUNTER — Other Ambulatory Visit: Payer: Self-pay

## 2019-06-08 ENCOUNTER — Encounter (HOSPITAL_COMMUNITY): Payer: Self-pay | Admitting: Emergency Medicine

## 2019-06-08 ENCOUNTER — Inpatient Hospital Stay (HOSPITAL_COMMUNITY)
Admission: EM | Admit: 2019-06-08 | Discharge: 2019-06-24 | DRG: 177 | Disposition: A | Discharge status: Skilled Nursing Facility | Payer: Medicare PPO | Attending: Internal Medicine | Admitting: Internal Medicine

## 2019-06-08 ENCOUNTER — Emergency Department (HOSPITAL_COMMUNITY): Payer: Medicare PPO

## 2019-06-08 DIAGNOSIS — M25561 Pain in right knee: Secondary | ICD-10-CM

## 2019-06-08 DIAGNOSIS — E871 Hypo-osmolality and hyponatremia: Secondary | ICD-10-CM | POA: Diagnosis present

## 2019-06-08 DIAGNOSIS — Z6841 Body Mass Index (BMI) 40.0 and over, adult: Secondary | ICD-10-CM

## 2019-06-08 DIAGNOSIS — Z808 Family history of malignant neoplasm of other organs or systems: Secondary | ICD-10-CM

## 2019-06-08 DIAGNOSIS — S8011XD Contusion of right lower leg, subsequent encounter: Secondary | ICD-10-CM

## 2019-06-08 DIAGNOSIS — M1711 Unilateral primary osteoarthritis, right knee: Secondary | ICD-10-CM | POA: Diagnosis present

## 2019-06-08 DIAGNOSIS — J690 Pneumonitis due to inhalation of food and vomit: Principal | ICD-10-CM

## 2019-06-08 DIAGNOSIS — G894 Chronic pain syndrome: Secondary | ICD-10-CM

## 2019-06-08 DIAGNOSIS — G4733 Obstructive sleep apnea (adult) (pediatric): Secondary | ICD-10-CM | POA: Diagnosis present

## 2019-06-08 DIAGNOSIS — R0602 Shortness of breath: Secondary | ICD-10-CM | POA: Diagnosis not present

## 2019-06-08 DIAGNOSIS — M7981 Nontraumatic hematoma of soft tissue: Secondary | ICD-10-CM | POA: Diagnosis not present

## 2019-06-08 DIAGNOSIS — F17210 Nicotine dependence, cigarettes, uncomplicated: Secondary | ICD-10-CM | POA: Diagnosis present

## 2019-06-08 DIAGNOSIS — I4819 Other persistent atrial fibrillation: Secondary | ICD-10-CM | POA: Diagnosis present

## 2019-06-08 DIAGNOSIS — J9601 Acute respiratory failure with hypoxia: Secondary | ICD-10-CM | POA: Diagnosis present

## 2019-06-08 DIAGNOSIS — Z886 Allergy status to analgesic agent status: Secondary | ICD-10-CM

## 2019-06-08 DIAGNOSIS — I69391 Dysphagia following cerebral infarction: Secondary | ICD-10-CM

## 2019-06-08 DIAGNOSIS — Z9884 Bariatric surgery status: Secondary | ICD-10-CM

## 2019-06-08 DIAGNOSIS — D509 Iron deficiency anemia, unspecified: Secondary | ICD-10-CM | POA: Diagnosis present

## 2019-06-08 DIAGNOSIS — I69334 Monoplegia of upper limb following cerebral infarction affecting left non-dominant side: Secondary | ICD-10-CM

## 2019-06-08 DIAGNOSIS — J449 Chronic obstructive pulmonary disease, unspecified: Secondary | ICD-10-CM | POA: Diagnosis present

## 2019-06-08 DIAGNOSIS — F4323 Adjustment disorder with mixed anxiety and depressed mood: Secondary | ICD-10-CM | POA: Diagnosis present

## 2019-06-08 DIAGNOSIS — Z7901 Long term (current) use of anticoagulants: Secondary | ICD-10-CM

## 2019-06-08 DIAGNOSIS — M549 Dorsalgia, unspecified: Secondary | ICD-10-CM | POA: Diagnosis present

## 2019-06-08 DIAGNOSIS — Z8249 Family history of ischemic heart disease and other diseases of the circulatory system: Secondary | ICD-10-CM

## 2019-06-08 DIAGNOSIS — I482 Chronic atrial fibrillation, unspecified: Secondary | ICD-10-CM | POA: Diagnosis not present

## 2019-06-08 DIAGNOSIS — E785 Hyperlipidemia, unspecified: Secondary | ICD-10-CM | POA: Diagnosis present

## 2019-06-08 DIAGNOSIS — Z8261 Family history of arthritis: Secondary | ICD-10-CM

## 2019-06-08 DIAGNOSIS — E162 Hypoglycemia, unspecified: Secondary | ICD-10-CM | POA: Diagnosis not present

## 2019-06-08 DIAGNOSIS — I639 Cerebral infarction, unspecified: Secondary | ICD-10-CM

## 2019-06-08 DIAGNOSIS — K219 Gastro-esophageal reflux disease without esophagitis: Secondary | ICD-10-CM | POA: Diagnosis present

## 2019-06-08 DIAGNOSIS — Z20822 Contact with and (suspected) exposure to covid-19: Secondary | ICD-10-CM | POA: Diagnosis present

## 2019-06-08 DIAGNOSIS — I10 Essential (primary) hypertension: Secondary | ICD-10-CM | POA: Diagnosis present

## 2019-06-08 DIAGNOSIS — Z931 Gastrostomy status: Secondary | ICD-10-CM

## 2019-06-08 DIAGNOSIS — R52 Pain, unspecified: Secondary | ICD-10-CM

## 2019-06-08 DIAGNOSIS — I69322 Dysarthria following cerebral infarction: Secondary | ICD-10-CM

## 2019-06-08 DIAGNOSIS — E860 Dehydration: Secondary | ICD-10-CM | POA: Diagnosis present

## 2019-06-08 DIAGNOSIS — T17908A Unspecified foreign body in respiratory tract, part unspecified causing other injury, initial encounter: Secondary | ICD-10-CM

## 2019-06-08 DIAGNOSIS — I739 Peripheral vascular disease, unspecified: Secondary | ICD-10-CM | POA: Diagnosis present

## 2019-06-08 DIAGNOSIS — Z66 Do not resuscitate: Secondary | ICD-10-CM | POA: Diagnosis not present

## 2019-06-08 DIAGNOSIS — Z9119 Patient's noncompliance with other medical treatment and regimen: Secondary | ICD-10-CM

## 2019-06-08 DIAGNOSIS — E870 Hyperosmolality and hypernatremia: Secondary | ICD-10-CM

## 2019-06-08 DIAGNOSIS — M1990 Unspecified osteoarthritis, unspecified site: Secondary | ICD-10-CM | POA: Diagnosis present

## 2019-06-08 DIAGNOSIS — I495 Sick sinus syndrome: Secondary | ICD-10-CM | POA: Diagnosis present

## 2019-06-08 LAB — BRAIN NATRIURETIC PEPTIDE: B Natriuretic Peptide: 82.3 pg/mL (ref 0.0–100.0)

## 2019-06-08 LAB — RAPID URINE DRUG SCREEN, HOSP PERFORMED
Amphetamines: NOT DETECTED
Barbiturates: NOT DETECTED
Benzodiazepines: NOT DETECTED
Cocaine: NOT DETECTED
Opiates: POSITIVE — AB
Tetrahydrocannabinol: NOT DETECTED

## 2019-06-08 LAB — CBC WITH DIFFERENTIAL/PLATELET
Abs Immature Granulocytes: 0.33 10*3/uL — ABNORMAL HIGH (ref 0.00–0.07)
Basophils Absolute: 0.1 10*3/uL (ref 0.0–0.1)
Basophils Relative: 0 %
Eosinophils Absolute: 0.2 10*3/uL (ref 0.0–0.5)
Eosinophils Relative: 1 %
HCT: 36 % — ABNORMAL LOW (ref 39.0–52.0)
Hemoglobin: 10.7 g/dL — ABNORMAL LOW (ref 13.0–17.0)
Immature Granulocytes: 2 %
Lymphocytes Relative: 9 %
Lymphs Abs: 1.6 10*3/uL (ref 0.7–4.0)
MCH: 28.8 pg (ref 26.0–34.0)
MCHC: 29.7 g/dL — ABNORMAL LOW (ref 30.0–36.0)
MCV: 96.8 fL (ref 80.0–100.0)
Monocytes Absolute: 1.4 10*3/uL — ABNORMAL HIGH (ref 0.1–1.0)
Monocytes Relative: 8 %
Neutro Abs: 14.8 10*3/uL — ABNORMAL HIGH (ref 1.7–7.7)
Neutrophils Relative %: 80 %
Platelets: 315 10*3/uL (ref 150–400)
RBC: 3.72 MIL/uL — ABNORMAL LOW (ref 4.22–5.81)
RDW: 19.6 % — ABNORMAL HIGH (ref 11.5–15.5)
WBC: 18.4 10*3/uL — ABNORMAL HIGH (ref 4.0–10.5)
nRBC: 0.2 % (ref 0.0–0.2)

## 2019-06-08 LAB — URINALYSIS, ROUTINE W REFLEX MICROSCOPIC
Bilirubin Urine: NEGATIVE
Glucose, UA: NEGATIVE mg/dL
Hgb urine dipstick: NEGATIVE
Ketones, ur: NEGATIVE mg/dL
Leukocytes,Ua: NEGATIVE
Nitrite: NEGATIVE
Protein, ur: 30 mg/dL — AB
Specific Gravity, Urine: 1.026 (ref 1.005–1.030)
pH: 5 (ref 5.0–8.0)

## 2019-06-08 LAB — BASIC METABOLIC PANEL
Anion gap: 12 (ref 5–15)
BUN: 50 mg/dL — ABNORMAL HIGH (ref 8–23)
CO2: 26 mmol/L (ref 22–32)
Calcium: 9.2 mg/dL (ref 8.9–10.3)
Chloride: 108 mmol/L (ref 98–111)
Creatinine, Ser: 1.12 mg/dL (ref 0.61–1.24)
GFR calc Af Amer: >60 mL/min (ref >60)
GFR calc non Af Amer: >60 mL/min (ref >60)
Glucose, Bld: 108 mg/dL — ABNORMAL HIGH (ref 70–99)
Potassium: 4.3 mmol/L (ref 3.5–5.1)
Sodium: 146 mmol/L — ABNORMAL HIGH (ref 135–145)

## 2019-06-08 LAB — TROPONIN I (HIGH SENSITIVITY)
Troponin I (High Sensitivity): 15 ng/L (ref <18)
Troponin I (High Sensitivity): 16 ng/L (ref <18)

## 2019-06-08 LAB — LACTIC ACID, PLASMA: Lactic Acid, Venous: 1.7 mmol/L (ref 0.5–1.9)

## 2019-06-08 LAB — RESPIRATORY PANEL BY RT PCR (FLU A&B, COVID)
Influenza A by PCR: NEGATIVE
Influenza B by PCR: NEGATIVE
SARS Coronavirus 2 by RT PCR: NEGATIVE

## 2019-06-08 IMAGING — DX DG CHEST 1V PORT
1 series · 1 of 1 positions shown · non-contrast
Comparison: Portable exam [VM] hours compared to [DATE]

CLINICAL DATA: Shortness of breath

EXAM:
PORTABLE CHEST 1 VIEW

[chest]
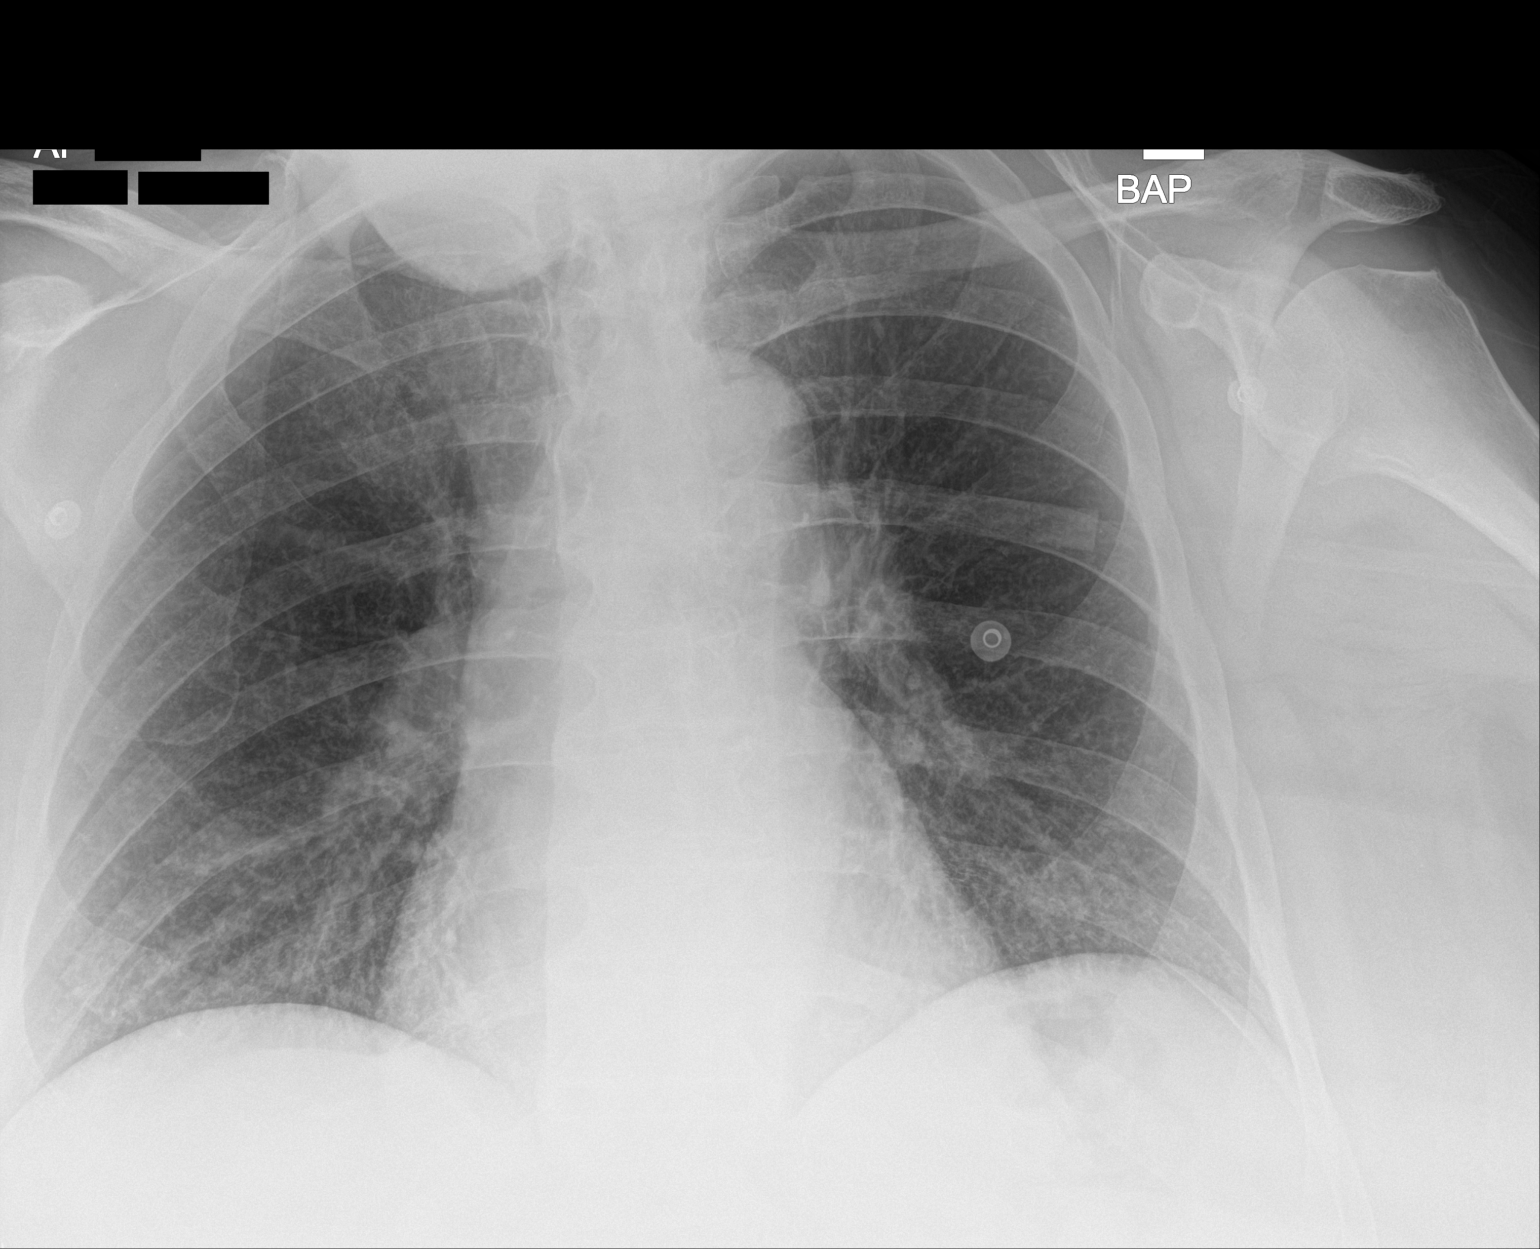

[1 of 1 positions shown; findings below may reference images not displayed]

FINDINGS: Rotated to the RIGHT.

Normal heart size, mediastinal contours, and pulmonary vascularity.

Chronic accentuation of basilar interstitial markings, stable.

No definite acute infiltrate, pleural effusion or pneumothorax.

Osseous structures unremarkable.
IMPRESSION: No acute abnormalities.

## 2019-06-08 MED ORDER — ACETAMINOPHEN 650 MG RE SUPP: 650.0000 mg | Freq: Four times a day (QID) | RECTAL | Status: DC | PRN

## 2019-06-08 MED ORDER — METOPROLOL TARTRATE 25 MG/10 ML ORAL SUSPENSION
12.5000 mg | Freq: Two times a day (BID) | ORAL | Status: DC
  Administered 2019-06-09 – 2019-06-23 (×28): 12.5 mg
  Filled 2019-06-08 (×35): qty 5

## 2019-06-08 MED ORDER — OXYCODONE HCL 5 MG/5ML PO SOLN
7.5000 mg | ORAL | Status: DC | PRN
  Administered 2019-06-09 – 2019-06-15 (×24): 7.5 mg
  Filled 2019-06-08 (×25): qty 10

## 2019-06-08 MED ORDER — FREE WATER
100.0000 mL | Freq: Four times a day (QID) | Status: DC
  Administered 2019-06-09 – 2019-06-11 (×10): 100 mL

## 2019-06-08 MED ORDER — ONDANSETRON HCL 4 MG/2ML IJ SOLN: 4.0000 mg | Freq: Four times a day (QID) | INTRAMUSCULAR | Status: DC | PRN

## 2019-06-08 MED ORDER — ONDANSETRON HCL 4 MG PO TABS: 4.0000 mg | ORAL_TABLET | Freq: Four times a day (QID) | ORAL | Status: DC | PRN

## 2019-06-08 MED ORDER — PANTOPRAZOLE SODIUM 40 MG PO PACK
40.0000 mg | PACK | Freq: Every day | ORAL | Status: DC
  Administered 2019-06-09 – 2019-06-23 (×15): 40 mg
  Filled 2019-06-08 (×15): qty 20

## 2019-06-08 MED ORDER — SODIUM CHLORIDE 0.9 % IV SOLN
3.0000 g | Freq: Once | INTRAVENOUS | Status: AC
  Administered 2019-06-08: 3 g via INTRAVENOUS
  Filled 2019-06-08: qty 8

## 2019-06-08 MED ORDER — ACETAMINOPHEN 325 MG PO TABS
650.0000 mg | ORAL_TABLET | Freq: Four times a day (QID) | ORAL | Status: DC | PRN
  Administered 2019-06-11: 650 mg via ORAL
  Filled 2019-06-08 (×2): qty 2

## 2019-06-08 MED ORDER — ALBUTEROL SULFATE (2.5 MG/3ML) 0.083% IN NEBU
2.5000 mg | INHALATION_SOLUTION | Freq: Four times a day (QID) | RESPIRATORY_TRACT | Status: DC
  Administered 2019-06-09 (×3): 2.5 mg via RESPIRATORY_TRACT
  Filled 2019-06-08 (×3): qty 3

## 2019-06-08 MED ORDER — SODIUM CHLORIDE 0.9 % IV SOLN
3.0000 g | Freq: Four times a day (QID) | INTRAVENOUS | Status: DC
  Administered 2019-06-09 – 2019-06-13 (×18): 3 g via INTRAVENOUS
  Filled 2019-06-08: qty 8
  Filled 2019-06-08 (×2): qty 3
  Filled 2019-06-08: qty 8
  Filled 2019-06-08 (×16): qty 3

## 2019-06-08 MED ORDER — JEVITY 1.2 CAL PO LIQD
1000.0000 mL | ORAL | Status: DC
  Filled 2019-06-08: qty 1000

## 2019-06-08 MED ORDER — PRO-STAT SUGAR FREE PO LIQD
30.0000 mL | Freq: Three times a day (TID) | ORAL | Status: DC
  Administered 2019-06-09 – 2019-06-13 (×14): 30 mL
  Filled 2019-06-08 (×13): qty 30

## 2019-06-08 MED ORDER — GABAPENTIN 250 MG/5ML PO SOLN
600.0000 mg | Freq: Four times a day (QID) | ORAL | Status: DC
  Administered 2019-06-09 – 2019-06-23 (×57): 600 mg
  Filled 2019-06-08 (×69): qty 12

## 2019-06-08 NOTE — ED Provider Notes (Signed)
[facility] EMERGENCY DEPARTMENT Provider Note   CSN: [id number] Arrival date & time: 06/08/19  1752     History No chief complaint on file.   DAEJUAN DRENNON is a 63 y.o. male.  Pt presents to the ED today with sob.  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 Estanislado Pandy.  He was unable to swallow without aspirating, so a J-tube was placed.  The pt signed himself out of the SNF and went home.  He drank something today and then developed sob.  Per EMS, pt's O2 sat was 82% on RA.         Past Medical History:  Diagnosis Date  . Arthritis   . Atrial fibrillation    HX OF SICK SINUS SYNDROME-ATRIAL FIB  . Back pain, chronic    PT STATES 5 BULGING DISKS AND PINCHED NERVES--PT ON OXYCODONE 4 TIMES A DAY FOR HIS PAIN  . Chest pain 08/26/2015  . Chronic airway obstruction, not elsewhere classified 12/21/2012  . GERD (gastroesophageal reflux disease)   . Hypertension   . Impaired fasting glucose 04/19/2013  . Peripheral vascular disease (HCC)    CHRONIC VENOUS INSUFFICIENCY  . Shortness of breath   . 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 Stroke ([place])   . Wears dentures     Patient Active Problem List   Diagnosis Date Noted  . Aspiration into airway 06/08/2019  . Dysphagia due to recent cerebrovascular accident 05/18/2019  . Tobacco use disorder 05/18/2019  . Chronic pain syndrome 05/18/2019  . Stroke ([place]) R MCA emb d/t AF not on [facility] s/p partial revascularization w/ IR 04/30/2019  . Middle cerebral artery embolism, right 04/30/2019  . Chronic atrial fibrillation ([place]) 08/26/2015  . Hyperlipidemia LDL goal <100 06/26/2014  . Routine general medical examination at a health care facility 11/21/2013  . Screening PSA (prostate specific antigen) 11/21/2013  . Special screening for malignant  neoplasms, colon 11/21/2013  . OSA (obstructive sleep apnea) 08/22/2013  . Morbid obesity ([place]) 04/19/2013  . Other emphysema ([place]) 01/31/2013  . Essential hypertension 12/21/2012  . Insomnia 12/21/2012  . GERD (gastroesophageal reflux disease) 09/20/2012  . Back pain, chronic 09/20/2012  . Obesity hypoventilation syndrome ([place]) 09/20/2012  . Adjustment disorder with mixed anxiety and depressed mood 09/20/2012    Past Surgical History:  Procedure Laterality Date  . ESOPHAGOGASTRODUODENOSCOPY  05/18/2011   Procedure: ESOPHAGOGASTRODUODENOSCOPY (EGD);  Surgeon: Shann Medal, MD;  Location: [facility];  Service: Endoscopy;  Laterality: N/A;  . FOOT SURGERY  2003   right foot surgery from work accident   . GASTRIC BYPASS  10/19/11  . GASTRIC RESTRICTION SURGERY  1992  . IR ANGIO VERTEBRAL SEL SUBCLAVIAN INNOMINATE UNI R MOD SED  04/30/2019  . IR CT HEAD LTD  04/30/2019  . IR PERCUTANEOUS ART THROMBECTOMY/INFUSION INTRACRANIAL INC DIAG ANGIO  04/30/2019  . IR REPLC DUODEN/JEJUNO TUBE PERCUT W/FLUORO  06/05/2019  . LAPAROSCOPIC INSERTION GASTROSTOMY TUBE N/A 05/16/2019   Procedure: LAPAROSCOPIC JEJUNOSTOMY TUBE;  Surgeon: Ralene Ok, MD;  Location: [facility];  Service: General;  Laterality: N/A;  . LEG SURGERY  1998   calcium deposit removed on right  leg   . RADIOLOGY WITH ANESTHESIA N/A 04/30/2019   Procedure: CODE STROKE;  Surgeon: Radiologist, Medication, MD;  Location: [facility]  OR;  Service: Radiology;  Laterality: N/A;       Family History  Problem Relation Age of Onset  . Cancer Mother        melanoma  . Heart disease Mother   . Rheum arthritis Mother   . Lung cancer Father        melanoma  . Heart disease Father   . Heart disease Sister     Social History   Tobacco Use  . Smoking status: Current Every Day Smoker    Packs/day: 0.50    Years: 40.00    Pack years: 20.00    Types: Cigarettes  . Smokeless tobacco: Never Used  Substance Use Topics  . Alcohol use: Yes     Alcohol/week: 0.0 standard drinks    Comment: RARELY  . Drug use: No    Home Medications Prior to Admission medications   Medication Sig Start Date End Date Taking? Authorizing Provider  albuterol (PROVENTIL HFA;VENTOLIN HFA) 108 (90 Base) MCG/ACT inhaler Inhale 2 puffs into the lungs every 4 (four) hours as needed for wheezing or shortness of breath. 08/30/15   Gildardo Cranker, DO  Amino Acids-Protein Hydrolys (FEEDING SUPPLEMENT, PRO-STAT SUGAR FREE 64,) LIQD Place 30 mLs into feeding tube 3 (three) times daily. 05/19/19   Donzetta Starch, NP  enoxaparin (LOVENOX) 150 MG/ML injection Inject 0.87 mLs (130 mg total) into the skin every 12 (twelve) hours. 05/24/19   Donzetta Starch, NP  gabapentin (NEURONTIN) 250 MG/5ML solution Place 12 mLs (600 mg total) into feeding tube 4 (four) times daily. 05/19/19   Donzetta Starch, NP  metoprolol tartrate (LOPRESSOR) 25 mg/10 mL SUSP Place 5 mLs (12.5 mg total) into feeding tube 2 (two) times daily. 05/23/19   Donzetta Starch, NP  Nutritional Supplements (FEEDING SUPPLEMENT, JEVITY 1.2 CAL,) LIQD Place 1,000 mLs into feeding tube continuous. 05/19/19   Donzetta Starch, NP  oxyCODONE (ROXICODONE) 5 MG/5ML solution Place 7.5 mLs (7.5 mg total) into feeding tube every 3 (three) hours as needed for severe pain or breakthrough pain. 05/19/19   Donzetta Starch, NP  pantoprazole sodium (PROTONIX) 40 mg/20 mL PACK Place 20 mLs (40 mg total) into feeding tube daily. 05/19/19   Donzetta Starch, NP  Water For Irrigation, Sterile (FREE WATER) SOLN Place 100 mLs into feeding tube every 6 (six) hours. 05/19/19   Donzetta Starch, NP  rivaroxaban (XARELTO) 20 MG TABS tablet Take 1 tablet (20 mg total) by mouth daily with supper. For blood clot prevention. Patient not taking: Reported on 10/03/2018 08/24/18 01/21/19  Pleas Koch, NP    Allergies    Vioxx [rofecoxib]  Review of Systems   Review of Systems  Respiratory: Positive for shortness of breath.   All other systems  reviewed and are negative.   Physical Exam Updated Vital Signs BP 125/81   Pulse 92   Temp 98.4 F (36.9 C) (Oral)   Resp 15   SpO2 100%   Physical Exam Vitals and nursing note reviewed.  Constitutional:      Appearance: Normal appearance. He is obese.  HENT:     Head: Normocephalic and atraumatic.     Right Ear: External ear normal.     Left Ear: External ear normal.     Nose: Nose normal.     Mouth/Throat:     Mouth: Mucous membranes are dry.  Eyes:     Conjunctiva/sclera: Conjunctivae normal.     Pupils: Pupils are equal, round, and reactive  to light.  Cardiovascular:     Rate and Rhythm: Tachycardia present. Rhythm irregular.     Pulses: Normal pulses.     Heart sounds: Normal heart sounds.  Pulmonary:     Effort: Tachypnea present.     Breath sounds: Rhonchi present.  Musculoskeletal:        General: Normal range of motion.     Cervical back: Normal range of motion and neck supple.  Skin:    General: Skin is warm.     Capillary Refill: Capillary refill takes less than 2 seconds.  Neurological:     Mental Status: He is alert.     Comments: Awake and alert.  Dysarthric.  Left UE slightly weaker than right.     Psychiatric:        Mood and Affect: Mood normal.        Behavior: Behavior normal.     ED Results / Procedures / Treatments   Labs (all labs ordered are listed, but only abnormal results are displayed) Labs Reviewed  BASIC METABOLIC PANEL - Abnormal; Notable for the following components:      Result Value   Sodium 146 (*)    Glucose, Bld 108 (*)    BUN 50 (*)    All other components within normal limits  CBC WITH DIFFERENTIAL/PLATELET - Abnormal; Notable for the following components:   WBC 18.4 (*)    RBC 3.72 (*)    Hemoglobin 10.7 (*)    HCT 36.0 (*)    MCHC 29.7 (*)    RDW 19.6 (*)    Neutro Abs 14.8 (*)    Monocytes Absolute 1.4 (*)    Abs Immature Granulocytes 0.33 (*)    All other components within normal limits  RESPIRATORY PANEL  BY RT PCR (FLU A&B, COVID)  CULTURE, BLOOD (ROUTINE X 2)  CULTURE, BLOOD (ROUTINE X 2)  BRAIN NATRIURETIC PEPTIDE  LACTIC ACID, PLASMA  LACTIC ACID, PLASMA  RAPID URINE DRUG SCREEN, HOSP PERFORMED  URINALYSIS, ROUTINE W REFLEX MICROSCOPIC  TROPONIN I (HIGH SENSITIVITY)  TROPONIN I (HIGH SENSITIVITY)    EKG EKG Interpretation  Date/Time:  Thursday June 08 2019 18:05:39 EST Ventricular Rate:  95 PR Interval:    QRS Duration: 102 QT Interval:  361 QTC Calculation: 454 R Axis:   72 Text Interpretation: Atrial fibrillation Ventricular premature complex Since last tracing rate faster Confirmed by Isla Pence [id number]) on 06/08/2019 6:09:25 PM   Radiology DG Chest Port 1 View  Result Date: 06/08/2019 CLINICAL DATA:  Shortness of breath EXAM: PORTABLE CHEST 1 VIEW COMPARISON:  Portable exam 1759 hours compared to 06/02/2019 FINDINGS: Rotated to the RIGHT. Normal heart size, mediastinal contours, and pulmonary vascularity. Chronic accentuation of basilar interstitial markings, stable. No definite acute infiltrate, pleural effusion or pneumothorax. Osseous structures unremarkable. IMPRESSION: No acute abnormalities. Electronically Signed   By: Lavonia Dana M.D.   On: 06/08/2019 18:19    Procedures Procedures (including critical care time)  Medications Ordered in ED Medications  Ampicillin-Sulbactam (UNASYN) 3 g in sodium chloride 0.9 % 100 mL IVPB (0 g Intravenous Stopped 06/08/19 1856)    ED Course  I have reviewed the triage vital signs and the nursing notes.  Pertinent labs & imaging results that were available during my care of the patient were reviewed by me and considered in my medical decision making (see chart for details).    MDM Rules/Calculators/A&P  RT suctioned about 1 inch of tan fluid from lungs. RR improved and O2 sat has improved.  He is on 3L [place] oxygen and O2 sats are in the mid to upper 90s.  Pt's resp status has continued to remain  stable.  Pt given unasyn for aspiration pneumonia.  Pt d/w Dr. Hal Hope ([facility]) for admission.  CRITICAL CARE Performed by: Isla Pence   Total critical care time: 30 minutes  Critical care time was exclusive of separately billable procedures and treating other patients.  Critical care was necessary to treat or prevent imminent or life-threatening deterioration.  Critical care was time spent personally by me on the following activities: development of treatment plan with patient and/or surrogate as well as nursing, discussions with consultants, evaluation of patient's response to treatment, examination of patient, obtaining history from patient or surrogate, ordering and performing treatments and interventions, ordering and review of laboratory studies, ordering and review of radiographic studies, pulse oximetry and re-evaluation of patient's condition.  Final Clinical Impression(s) / ED Diagnoses Final diagnoses:  Aspiration pneumonia, unspecified aspiration pneumonia type, unspecified laterality, unspecified part of lung ([place])  Dysphagia due to recent stroke    Rx / DC Orders ED Discharge Orders    None       Isla Pence, MD 06/08/19 2041

## 2019-06-08 NOTE — ED Notes (Addendum)
ED TO INPATIENT HANDOFF REPORT  ED Nurse Name and Phone #:  [phone]  S Name/Age/Gender Kyle [name] 63 y.o. male Room/Bed: 021C/021C  Code Status   Code Status: Prior  Home/SNF/Other From home, previously SNF Patient oriented to: self, place, time and situation Is this baseline? Yes   Triage Complete: Triage complete  Chief Complaint Aspiration into airway [T17.908A]  Triage Note Pt here from nursing facility where he escaped from and he also has a feeding tube. Pt reportedly tried to drink something and aspirated some sprite. Pt hx of stroke. Pts 02 was 82 on room air. EMS applied 4l [place] and t or sat went to 88 - 92.    Allergies Allergies  Allergen Reactions  . Vioxx [Rofecoxib] Rash    Level of Care/Admitting Diagnosis ED Disposition    ED Disposition Condition Comment   Admit  Hospital Area: [facility] [[id number]]  Level of Care: Telemetry Medical [104]  I expect the patient will be discharged within 24 hours: No (not a candidate for 5C-Observation unit)  Covid Evaluation: Confirmed COVID Negative  Diagnosis: Aspiration into airway [id number]  Admitting Physician: Rise Patience [phone]  Attending Physician: Rise Patience [email or web address]       B Medical/Surgery History Past Medical History:  Diagnosis Date  . Arthritis   . Atrial fibrillation    HX OF SICK SINUS SYNDROME-ATRIAL FIB  . Back pain, chronic    PT STATES 5 BULGING DISKS AND PINCHED NERVES--PT ON OXYCODONE 4 TIMES A DAY FOR HIS PAIN  . Chest pain 08/26/2015  . Chronic airway obstruction, not elsewhere classified 12/21/2012  . GERD (gastroesophageal reflux disease)   . Hypertension   . Impaired fasting glucose 04/19/2013  . Peripheral vascular disease (HCC)    CHRONIC VENOUS INSUFFICIENCY  . Shortness of breath   . 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 Stroke ([place])   . Wears dentures     Past Surgical History:  Procedure Laterality Date  . ESOPHAGOGASTRODUODENOSCOPY  05/18/2011   Procedure: ESOPHAGOGASTRODUODENOSCOPY (EGD);  Surgeon: Shann Medal, MD;  Location: [facility];  Service: Endoscopy;  Laterality: N/A;  . FOOT SURGERY  2003   right foot surgery from work accident   . GASTRIC BYPASS  10/19/11  . GASTRIC RESTRICTION SURGERY  1992  . IR ANGIO VERTEBRAL SEL SUBCLAVIAN INNOMINATE UNI R MOD SED  04/30/2019  . IR CT HEAD LTD  04/30/2019  . IR PERCUTANEOUS ART THROMBECTOMY/INFUSION INTRACRANIAL INC DIAG ANGIO  04/30/2019  . IR REPLC DUODEN/JEJUNO TUBE PERCUT W/FLUORO  06/05/2019  . LAPAROSCOPIC INSERTION GASTROSTOMY TUBE N/A 05/16/2019   Procedure: LAPAROSCOPIC JEJUNOSTOMY TUBE;  Surgeon: Ralene Ok, MD;  Location: [facility];  Service: General;  Laterality: N/A;  . LEG SURGERY  1998   calcium deposit removed on right  leg   . RADIOLOGY WITH ANESTHESIA N/A 04/30/2019   Procedure: CODE STROKE;  Surgeon: Radiologist, Medication, MD;  Location: [facility];  Service: Radiology;  Laterality: N/A;     A IV Location/Drains/Wounds Patient Lines/Drains/Airways Status   Active Line/Drains/Airways    Name:   Placement date:   Placement time:   Site:   Days:   Peripheral IV 06/08/19 Left Antecubital   06/08/19    1850    Antecubital   less than 1   Gastrostomy/Enterostomy Jejunostomy 16 Fr. LUQ   06/05/19    1231  LUQ   3   Incision 10/19/11 Abdomen Other (Comment)   10/19/11    1315     2789   Incision (Closed) 05/16/19 Abdomen   05/16/19    1049     23   Incision - 3 Ports Abdomen 1: Right;Lower 2: Right;Medial 3: Left;Lower   05/16/19    0935     23   Incision - 5 Ports Abdomen Right;Upper Right;Mid Left;Lower Medial;Left Left;Upper   10/19/11    1303     2789          Intake/Output Last 24 hours  Intake/Output Summary (Last 24 hours) at 06/08/2019 2119 Last data filed at 06/08/2019 1856 Gross per 24 hour  Intake 100 ml  Output -  Net 100 ml     Labs/Imaging Results for orders placed or performed during the hospital encounter of 06/08/19 (from the past 48 hour(s))  Basic metabolic panel     Status: Abnormal   Collection Time: 06/08/19  6:55 PM  Result Value Ref Range   Sodium 146 (H) 135 - 145 mmol/L   Potassium 4.3 3.5 - 5.1 mmol/L   Chloride 108 98 - 111 mmol/L   CO2 26 22 - 32 mmol/L   Glucose, Bld 108 (H) 70 - 99 mg/dL   BUN 50 (H) 8 - 23 mg/dL   Creatinine, Ser 1.12 0.61 - 1.24 mg/dL   Calcium 9.2 8.9 - 10.3 mg/dL   GFR calc non Af Amer >60 >60 mL/min   GFR calc Af Amer >60 >60 mL/min   Anion gap 12 5 - 15    Comment: Performed at [facility], [place] [address] [place], [place] [address]  CBC with Differential     Status: Abnormal   Collection Time: 06/08/19  6:55 PM  Result Value Ref Range   WBC 18.4 (H) 4.0 - 10.5 K/uL   RBC 3.72 (L) 4.22 - 5.81 MIL/uL   Hemoglobin 10.7 (L) 13.0 - 17.0 g/dL   HCT 36.0 (L) 39.0 - 52.0 %   MCV 96.8 80.0 - 100.0 fL   MCH 28.8 26.0 - 34.0 pg   MCHC 29.7 (L) 30.0 - 36.0 g/dL   RDW 19.6 (H) 11.5 - 15.5 %   Platelets 315 150 - 400 K/uL   nRBC 0.2 0.0 - 0.2 %   Neutrophils Relative % 80 %   Neutro Abs 14.8 (H) 1.7 - 7.7 K/uL   Lymphocytes Relative 9 %   Lymphs Abs 1.6 0.7 - 4.0 K/uL   Monocytes Relative 8 %   Monocytes Absolute 1.4 (H) 0.1 - 1.0 K/uL   Eosinophils Relative 1 %   Eosinophils Absolute 0.2 0.0 - 0.5 K/uL   Basophils Relative 0 %   Basophils Absolute 0.1 0.0 - 0.1 K/uL   Immature Granulocytes 2 %   Abs Immature Granulocytes 0.33 (H) 0.00 - 0.07 K/uL    Comment: Performed at [facility] [address], [place], [place] [address]  Brain natriuretic peptide     Status: None   Collection Time: 06/08/19  6:55 PM  Result Value Ref Range   B Natriuretic Peptide 82.3 0.0 - 100.0 pg/mL    Comment: Performed at [facility] [address], [place], [place] [address]  Troponin I (High Sensitivity)     Status: None   Collection Time: 06/08/19  6:55  PM  Result Value Ref Range   Troponin I (High Sensitivity) 15 <18 ng/L    Comment: (  NOTE) Elevated high sensitivity troponin I (hsTnI) values and significant  changes across serial measurements may suggest ACS but many other  chronic and acute conditions are known to elevate hsTnI results.  Refer to the "Links" section for chest pain algorithms and additional  guidance. Performed at [facility], [place] [address], [place], [place] [address]   Respiratory Panel by RT PCR (Flu A&B, Covid) - Nasopharyngeal Swab     Status: None   Collection Time: 06/08/19  7:00 PM   Specimen: Nasopharyngeal Swab  Result Value Ref Range   SARS Coronavirus 2 by RT PCR NEGATIVE NEGATIVE    Comment: (NOTE) SARS-CoV-2 target nucleic acids are NOT DETECTED. The SARS-CoV-2 RNA is generally detectable in upper respiratoy specimens during the acute phase of infection. The lowest concentration of SARS-CoV-2 viral copies this assay can detect is 131 copies/mL. A negative result does not preclude SARS-Cov-2 infection and should not be used as the sole basis for treatment or other patient management decisions. A negative result may occur with  improper specimen collection/handling, submission of specimen other than nasopharyngeal swab, presence of viral mutation(s) within the areas targeted by this assay, and inadequate number of viral copies (<131 copies/mL). A negative result must be combined with clinical observations, patient history, and epidemiological information. The expected result is Negative. Fact Sheet for Patients:  PinkCheek.be Fact Sheet for Healthcare Providers:  GravelBags.it This test is not yet ap proved or cleared by the [place] FDA and  has been authorized for detection and/or diagnosis of SARS-CoV-2 by FDA under an Emergency Use Authorization (EUA). This EUA will remain  in effect (meaning this test can be used) for the  duration of the COVID-19 declaration under Section 564(b)(1) of the Act, 21 U.S.C. section 360bbb-3(b)(1), unless the authorization is terminated or revoked sooner.    Influenza A by PCR NEGATIVE NEGATIVE   Influenza B by PCR NEGATIVE NEGATIVE    Comment: (NOTE) The Xpert Xpress SARS-CoV-2/FLU/RSV assay is intended as an aid in  the diagnosis of influenza from Nasopharyngeal swab specimens and  should not be used as a sole basis for treatment. Nasal washings and  aspirates are unacceptable for Xpert Xpress SARS-CoV-2/FLU/RSV  testing. Fact Sheet for Patients: PinkCheek.be Fact Sheet for Healthcare Providers: GravelBags.it This test is not yet approved or cleared by the [place] FDA and  has been authorized for detection and/or diagnosis of SARS-CoV-2 by  FDA under an Emergency Use Authorization (EUA). This EUA will remain  in effect (meaning this test can be used) for the duration of the  Covid-19 declaration under Section 564(b)(1) of the Act, 21  U.S.C. section 360bbb-3(b)(1), unless the authorization is  terminated or revoked. Performed at [facility], [place] [address], [place], [place] [address]   Lactic acid, plasma     Status: None   Collection Time: 06/08/19  7:54 PM  Result Value Ref Range   Lactic Acid, Venous 1.7 0.5 - 1.9 mmol/L    Comment: Performed at [facility] [address], [place], [place] [address]   DG Chest Port 1 View  Result Date: 06/08/2019 CLINICAL DATA:  Shortness of breath EXAM: PORTABLE CHEST 1 VIEW COMPARISON:  Portable exam 1759 hours compared to 06/02/2019 FINDINGS: Rotated to the RIGHT. Normal heart size, mediastinal contours, and pulmonary vascularity. Chronic accentuation of basilar interstitial markings, stable. No definite acute infiltrate, pleural effusion or pneumothorax. Osseous structures unremarkable. IMPRESSION: No acute abnormalities. Electronically Signed   By:  Elta Guadeloupe  Thornton Papas M.D.   On: 06/08/2019 18:19    Pending Labs Unresulted Labs (From admission, onward)    Start     Ordered   06/08/19 2005  Rapid urine drug screen (hospital performed)  ONCE - STAT,   STAT     06/08/19 2005   06/08/19 2005  Urinalysis, Routine w reflex microscopic  Once,   STAT     06/08/19 2005   06/08/19 1754  Culture, blood (routine x 2)  BLOOD CULTURE X 2,   STAT     06/08/19 1754          Vitals/Pain Today's Vitals   06/08/19 1945 06/08/19 2030 06/08/19 2045 06/08/19 2102  BP:    (!) 165/67  Pulse: (!) 102 92 98 90  Resp: 12 15 12 16   Temp:      TempSrc:      SpO2: 100% 100% 100% 98%  PainSc:        Isolation Precautions No active isolations  Medications Medications  Ampicillin-Sulbactam (UNASYN) 3 g in sodium chloride 0.9 % 100 mL IVPB (0 g Intravenous Stopped 06/08/19 1856)    Mobility walks with person assist Moderate fall risk   Focused Assessments Pulmonary Assessment Handoff:  Lung sounds: Bilateral Breath Sounds: Diminished, Coarse crackles L Breath Sounds: Diminished, Coarse crackles R Breath Sounds: Diminished, Coarse crackles O2 Device: Nasal Cannula O2 Flow Rate (L/min): 3 L/min      R Recommendations: See Admitting Provider Note  Report given to:   Additional Notes: -

## 2019-06-08 NOTE — H&P (Addendum)
History and Physical    Kyle [name] [id number] DOB: 11-Aug-1955 DOA: 06/08/2019  PCP: [name], MD  Patient coming from: Home.  Chief Complaint: Shortness of breath.  HPI: Kyle [name] is a 63 y.o. male with history of atrial fibrillation, hypertension, COPD was recently admitted for acute CVA underwent mechanical thrombectomy for right MCA infarct with patient having dysphagia and difficulty speaking had to undergo PEG tube placement and was discharged to rehab when patient signed out himself [place] and went home and try to eat when he aspirated and became short of breath and EMS was called.  Patient was brought to the ER.  Denies any chest pain shortness of breath abdominal pain or diarrhea.  ED Course: In the ER patient was afebrile Covid test was negative.  Chest x-ray was unremarkable patient was requiring 3 L oxygen.  Patient was having loud rhonchorous sound on exam and was having a lot of secretion and acid to be constantly aspirated.  Labs show sodium 146 WBC 18.4 hemoglobin 10.7 EKG shows A. fib rate controlled.  Given the shortness of breath with patient requiring 3 L oxygen patient has been admitted for further observation for aspiration.  Patient was started on Unasyn empirically.  Review of Systems: As per HPI, rest all negative.   Past Medical History:  Diagnosis Date  . Arthritis   . Atrial fibrillation    HX OF SICK SINUS SYNDROME-ATRIAL FIB  . Back pain, chronic    PT STATES 5 BULGING DISKS AND PINCHED NERVES--PT ON OXYCODONE 4 TIMES A DAY FOR HIS PAIN  . Chest pain 08/26/2015  . Chronic airway obstruction, not elsewhere classified 12/21/2012  . GERD (gastroesophageal reflux disease)   . Hypertension   . Impaired fasting glucose 04/19/2013  . Peripheral vascular disease (HCC)    CHRONIC VENOUS INSUFFICIENCY  . Shortness of breath   . 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 Stroke ([place])   . Wears dentures     Past Surgical History:  Procedure Laterality Date  . ESOPHAGOGASTRODUODENOSCOPY  05/18/2011   Procedure: ESOPHAGOGASTRODUODENOSCOPY (EGD);  Surgeon: Shann Medal, MD;  Location: [facility];  Service: Endoscopy;  Laterality: N/A;  . FOOT SURGERY  2003   right foot surgery from work accident   . GASTRIC BYPASS  10/19/11  . GASTRIC RESTRICTION SURGERY  1992  . IR ANGIO VERTEBRAL SEL SUBCLAVIAN INNOMINATE UNI R MOD SED  04/30/2019  . IR CT HEAD LTD  04/30/2019  . IR PERCUTANEOUS ART THROMBECTOMY/INFUSION INTRACRANIAL INC DIAG ANGIO  04/30/2019  . IR REPLC DUODEN/JEJUNO TUBE PERCUT W/FLUORO  06/05/2019  . LAPAROSCOPIC INSERTION GASTROSTOMY TUBE N/A 05/16/2019   Procedure: LAPAROSCOPIC JEJUNOSTOMY TUBE;  Surgeon: Ralene Ok, MD;  Location: [facility];  Service: General;  Laterality: N/A;  . LEG SURGERY  1998   calcium deposit removed on right  leg   . RADIOLOGY WITH ANESTHESIA N/A 04/30/2019   Procedure: CODE STROKE;  Surgeon: Radiologist, Medication, MD;  Location: [facility];  Service: Radiology;  Laterality: N/A;     reports that he has been smoking cigarettes. He has a 20.00 pack-year smoking history. He has never used smokeless tobacco. He reports current alcohol use. He reports that he does not use drugs.  Allergies  Allergen Reactions  . Vioxx [Rofecoxib] Rash    Family History  Problem Relation Age of Onset  . Cancer Mother  melanoma  . Heart disease Mother   . Rheum arthritis Mother   . Lung cancer Father        melanoma  . Heart disease Father   . Heart disease Sister     Prior to Admission medications   Medication Sig Start Date End Date Taking? Authorizing Provider  albuterol (PROVENTIL HFA;VENTOLIN HFA) 108 (90 Base) MCG/ACT inhaler Inhale 2 puffs into the lungs every 4 (four) hours as needed for wheezing or shortness of breath. 08/30/15   Gildardo Cranker, DO  Amino Acids-Protein Hydrolys (FEEDING SUPPLEMENT,  PRO-STAT SUGAR FREE 64,) LIQD Place 30 mLs into feeding tube 3 (three) times daily. 05/19/19   Donzetta Starch, NP  enoxaparin (LOVENOX) 150 MG/ML injection Inject 0.87 mLs (130 mg total) into the skin every 12 (twelve) hours. 05/24/19   Donzetta Starch, NP  gabapentin (NEURONTIN) 250 MG/5ML solution Place 12 mLs (600 mg total) into feeding tube 4 (four) times daily. 05/19/19   Donzetta Starch, NP  metoprolol tartrate (LOPRESSOR) 25 mg/10 mL SUSP Place 5 mLs (12.5 mg total) into feeding tube 2 (two) times daily. 05/23/19   Donzetta Starch, NP  Nutritional Supplements (FEEDING SUPPLEMENT, JEVITY 1.2 CAL,) LIQD Place 1,000 mLs into feeding tube continuous. 05/19/19   Donzetta Starch, NP  oxyCODONE (ROXICODONE) 5 MG/5ML solution Place 7.5 mLs (7.5 mg total) into feeding tube every 3 (three) hours as needed for severe pain or breakthrough pain. 05/19/19   Donzetta Starch, NP  pantoprazole sodium (PROTONIX) 40 mg/20 mL PACK Place 20 mLs (40 mg total) into feeding tube daily. 05/19/19   Donzetta Starch, NP  Water For Irrigation, Sterile (FREE WATER) SOLN Place 100 mLs into feeding tube every 6 (six) hours. 05/19/19   Donzetta Starch, NP  rivaroxaban (XARELTO) 20 MG TABS tablet Take 1 tablet (20 mg total) by mouth daily with supper. For blood clot prevention. Patient not taking: Reported on 10/03/2018 08/24/18 01/21/19  Pleas Koch, NP    Physical Exam: Constitutional: Moderately built and nourished. Vitals:   06/08/19 2045 06/08/19 2102 06/08/19 2200 06/08/19 2335  BP:  (!) 165/67 (!) 144/73 133/63  Pulse: 98 90 79 92  Resp: 12 16 15 20   Temp:    98.4 F (36.9 C)  TempSrc:    Oral  SpO2: 100% 98% 97% 98%   Eyes: Anicteric no pallor. ENMT: No discharge from the ears eyes nose or mouth. Neck: No mass felt.  No neck rigidity. Respiratory: Mild rhonchi bilaterally.  No crepitations. Cardiovascular: S1-S2 heard. Abdomen: Soft nontender bowel sounds present. Musculoskeletal: No edema.  Has mild swelling of the  right knee. Skin: No rash. Neurologic: Alert awake oriented to his name and follows commands but has some difficulty talking from recent stroke. Psychiatric: Appears normal.   Labs on Admission: I have personally reviewed following labs and imaging studies  CBC: Recent Labs  Lab 06/08/19 1855  WBC 18.4*  NEUTROABS 14.8*  HGB 10.7*  HCT 36.0*  MCV 96.8  PLT 123456   Basic Metabolic Panel: Recent Labs  Lab 06/08/19 1855  NA 146*  K 4.3  CL 108  CO2 26  GLUCOSE 108*  BUN 50*  CREATININE 1.12  CALCIUM 9.2   GFR: CrCl cannot be calculated (Unknown ideal weight.). Liver Function Tests: No results for input(s): AST, ALT, ALKPHOS, BILITOT, PROT, ALBUMIN in the last 168 hours. No results for input(s): LIPASE, AMYLASE in the last 168 hours. No results for input(s): AMMONIA in the  last 168 hours. Coagulation Profile: No results for input(s): INR, PROTIME in the last 168 hours. Cardiac Enzymes: No results for input(s): CKTOTAL, CKMB, CKMBINDEX, TROPONINI in the last 168 hours. BNP (last 3 results) No results for input(s): PROBNP in the last 8760 hours. HbA1C: No results for input(s): HGBA1C in the last 72 hours. CBG: No results for input(s): GLUCAP in the last 168 hours. Lipid Profile: No results for input(s): CHOL, HDL, LDLCALC, TRIG, CHOLHDL, LDLDIRECT in the last 72 hours. Thyroid Function Tests: No results for input(s): TSH, T4TOTAL, FREET4, T3FREE, THYROIDAB in the last 72 hours. Anemia Panel: No results for input(s): VITAMINB12, FOLATE, FERRITIN, TIBC, IRON, RETICCTPCT in the last 72 hours. Urine analysis:    Component Value Date/Time   COLORURINE YELLOW 06/08/2019 2113   APPEARANCEUR CLEAR 06/08/2019 2113   LABSPEC 1.026 06/08/2019 2113   PHURINE 5.0 06/08/2019 2113   GLUCOSEU NEGATIVE 06/08/2019 2113   HGBUR NEGATIVE 06/08/2019 2113   [place] NEGATIVE 06/08/2019 2113   [place] NEGATIVE 06/08/2019 2113   PROTEINUR 30 (A) 06/08/2019 2113   NITRITE  NEGATIVE 06/08/2019 2113   LEUKOCYTESUR NEGATIVE 06/08/2019 2113   Sepsis Labs: @LABRCNTIP (procalcitonin:4,lacticidven:4) ) Recent Results (from the past 240 hour(s))  Respiratory Panel by RT PCR (Flu A&B, Covid) - Nasopharyngeal Swab     Status: None   Collection Time: 06/08/19  7:00 PM   Specimen: Nasopharyngeal Swab  Result Value Ref Range Status   SARS Coronavirus 2 by RT PCR NEGATIVE NEGATIVE Final    Comment: (NOTE) SARS-CoV-2 target nucleic acids are NOT DETECTED. The SARS-CoV-2 RNA is generally detectable in upper respiratoy specimens during the acute phase of infection. The lowest concentration of SARS-CoV-2 viral copies this assay can detect is 131 copies/mL. A negative result does not preclude SARS-Cov-2 infection and should not be used as the sole basis for treatment or other patient management decisions. A negative result may occur with  improper specimen collection/handling, submission of specimen other than nasopharyngeal swab, presence of viral mutation(s) within the areas targeted by this assay, and inadequate number of viral copies (<131 copies/mL). A negative result must be combined with clinical observations, patient history, and epidemiological information. The expected result is Negative. Fact Sheet for Patients:  PinkCheek.be Fact Sheet for Healthcare Providers:  GravelBags.it This test is not yet ap proved or cleared by the [place] FDA and  has been authorized for detection and/or diagnosis of SARS-CoV-2 by FDA under an Emergency Use Authorization (EUA). This EUA will remain  in effect (meaning this test can be used) for the duration of the COVID-19 declaration under Section 564(b)(1) of the Act, 21 U.S.C. section 360bbb-3(b)(1), unless the authorization is terminated or revoked sooner.    Influenza A by PCR NEGATIVE NEGATIVE Final   Influenza B by PCR NEGATIVE NEGATIVE Final    Comment:  (NOTE) The Xpert Xpress SARS-CoV-2/FLU/RSV assay is intended as an aid in  the diagnosis of influenza from Nasopharyngeal swab specimens and  should not be used as a sole basis for treatment. Nasal washings and  aspirates are unacceptable for Xpert Xpress SARS-CoV-2/FLU/RSV  testing. Fact Sheet for Patients: PinkCheek.be Fact Sheet for Healthcare Providers: GravelBags.it This test is not yet approved or cleared by the [place] FDA and  has been authorized for detection and/or diagnosis of SARS-CoV-2 by  FDA under an Emergency Use Authorization (EUA). This EUA will remain  in effect (meaning this test can be used) for the duration of the  Covid-19 declaration under Section 564(b)(1) of the  Act, 21  U.S.C. section 360bbb-3(b)(1), unless the authorization is  terminated or revoked. Performed at [facility], [place] [address], [place], [place] [address]      Radiological Exams on Admission: DG Chest Port 1 View  Result Date: 06/08/2019 CLINICAL DATA:  Shortness of breath EXAM: PORTABLE CHEST 1 VIEW COMPARISON:  Portable exam 1759 hours compared to 06/02/2019 FINDINGS: Rotated to the RIGHT. Normal heart size, mediastinal contours, and pulmonary vascularity. Chronic accentuation of basilar interstitial markings, stable. No definite acute infiltrate, pleural effusion or pneumothorax. Osseous structures unremarkable. IMPRESSION: No acute abnormalities. Electronically Signed   By: Lavonia Dana M.D.   On: 06/08/2019 18:19    EKG: Independently reviewed.  A. fib rate controlled.  Assessment/Plan Principal Problem:   Aspiration into airway Active Problems:   Essential hypertension   Chronic atrial fibrillation (HCC)   Dysphagia due to recent cerebrovascular accident   Aspiration pneumonia ([place])    1. Acute respiratory failure with hypoxia secondary to aspiration into the airway with history of dysphagia from recent stroke  on PEG tube placement -patient had aspiration into the airway after patient attempted to eat.  Given the acute respiratory failure with hypoxia and persistent secretion will admit for observation continue suction as needed empiric antibiotics now and bronchodilators. 2. Persistent A. fib for which patient is on Lovenox and metoprolol. 3. Recent stroke on Lovenox.  Status post thrombectomy during recent admission for stroke. 4. Anemia appears to be new.  Will check anemia panel with next blood draw.  Follow CBC closely. 5. Has had right knee erythema seen but patient is not having any pain or is not warm to touch.  Per patient it appears to be old.  Closely observe. 6. Dysphagia on PEG tube diet. 7. Mild hyponatremia likely from dehydration for which I have placed patient on D5W. 8. Chronic pain continue home medications for pain.   DVT prophylaxis: Lovenox full dose for stroke. Code Status: Full code. Family Communication: Discussed with patient. Disposition Plan: May need rehab again. Consults called: Physical therapy and transition of care. Admission status: Observation.   Rise Patience MD [facility] Pager [phone].  If 7PM-7AM, please contact night-coverage [email or web address] Password [email or web address]  06/08/2019, 11:38 PM

## 2019-06-08 NOTE — Progress Notes (Signed)
Pt arrived via EMS with c/o aspiration. Pt awake, sitting up and able to follow commands when spoken loudly. Apparent gurgling heard in upper airway. Pt suctioned deep into pt's oropharyngeal airway, a copious amount of tan/milky secretions removed. Pt's RUL is rhonchus. LUL is clear/diminished. RT is available should pt need further intervention. Vitals stable at this time on 3L [place] with a RR of 20 an HR of 95.

## 2019-06-08 NOTE — ED Triage Notes (Signed)
Pt here from nursing facility where he escaped from and he also has a feeding tube. Pt reportedly tried to drink something and aspirated some sprite. Pt hx of stroke. Pts 02 was 82 on room air. EMS applied 4l [place] and t or sat went to 88 - 92.

## 2019-06-08 NOTE — ED Notes (Signed)
Suction at bedside

## 2019-06-08 NOTE — ED Notes (Signed)
Per report previous RN was unable to collect second set of blood cultures. Antibiotic hung by previous clinician

## 2019-06-08 NOTE — Progress Notes (Signed)
Pharmacy Antibiotic Note  Kyle [name] is a 63 y.o. male admitted on 06/08/2019 with concern for aspiration pneumonia.  Pharmacy has been consulted for Unasyn dosing.  Plan: Unasyn 3g IV Q6H.  Height: 6\' 3"  (190.5 cm) Weight: (!) 302 lb 14.6 oz (137.4 kg) IBW/kg (Calculated) : 84.5  Temp (24hrs), Avg:98.4 F (36.9 C), Min:98.4 F (36.9 C), Max:98.4 F (36.9 C)  Recent Labs  Lab 06/08/19 1855 06/08/19 1954  WBC 18.4*  --   CREATININE 1.12  --   LATICACIDVEN  --  1.7    Estimated Creatinine Clearance: 103.6 mL/min (by C-G formula based on SCr of 1.12 mg/dL).    Allergies  Allergen Reactions  . Vioxx [Rofecoxib] Rash     Thank you for allowing pharmacy to be a part of this patient's care.  Wynona Neat, PharmD, BCPS  06/08/2019 11:40 PM

## 2019-06-09 LAB — BASIC METABOLIC PANEL
Anion gap: 14 (ref 5–15)
BUN: 46 mg/dL — ABNORMAL HIGH (ref 8–23)
CO2: 26 mmol/L (ref 22–32)
Calcium: 9.2 mg/dL (ref 8.9–10.3)
Chloride: 109 mmol/L (ref 98–111)
Creatinine, Ser: 1.04 mg/dL (ref 0.61–1.24)
GFR calc Af Amer: >60 mL/min (ref >60)
GFR calc non Af Amer: >60 mL/min (ref >60)
Glucose, Bld: 105 mg/dL — ABNORMAL HIGH (ref 70–99)
Potassium: 3.7 mmol/L (ref 3.5–5.1)
Sodium: 149 mmol/L — ABNORMAL HIGH (ref 135–145)

## 2019-06-09 LAB — GLUCOSE, CAPILLARY: Glucose-Capillary: 121 mg/dL — ABNORMAL HIGH (ref 70–99)

## 2019-06-09 LAB — IRON AND TIBC
Iron: 29 ug/dL — ABNORMAL LOW (ref 45–182)
Saturation Ratios: 13 % — ABNORMAL LOW (ref 17.9–39.5)
TIBC: 231 ug/dL — ABNORMAL LOW (ref 250–450)
UIBC: 202 ug/dL

## 2019-06-09 LAB — CBC
HCT: 35.4 % — ABNORMAL LOW (ref 39.0–52.0)
Hemoglobin: 10.8 g/dL — ABNORMAL LOW (ref 13.0–17.0)
MCH: 28.5 pg (ref 26.0–34.0)
MCHC: 30.5 g/dL (ref 30.0–36.0)
MCV: 93.4 fL (ref 80.0–100.0)
Platelets: 279 10*3/uL (ref 150–400)
RBC: 3.79 MIL/uL — ABNORMAL LOW (ref 4.22–5.81)
RDW: 19.6 % — ABNORMAL HIGH (ref 11.5–15.5)
WBC: 16.5 10*3/uL — ABNORMAL HIGH (ref 4.0–10.5)
nRBC: 0.2 % (ref 0.0–0.2)

## 2019-06-09 LAB — FOLATE: Folate: 12.6 ng/mL (ref >5.9)

## 2019-06-09 LAB — RETICULOCYTES
Immature Retic Fract: 33.9 % — ABNORMAL HIGH (ref 2.3–15.9)
RBC.: 3.33 MIL/uL — ABNORMAL LOW (ref 4.22–5.81)
Retic Count, Absolute: 140.5 10*3/uL (ref 19.0–186.0)
Retic Ct Pct: 4.2 % — ABNORMAL HIGH (ref 0.4–3.1)

## 2019-06-09 LAB — FERRITIN: Ferritin: 84 ng/mL (ref 24–336)

## 2019-06-09 LAB — VITAMIN B12: Vitamin B-12: 393 pg/mL (ref 180–914)

## 2019-06-09 MED ORDER — LEVOFLOXACIN 500 MG PO TABS: 500.0000 mg | ORAL_TABLET | Freq: Every day | ORAL | Status: DC

## 2019-06-09 MED ORDER — ALBUTEROL SULFATE (2.5 MG/3ML) 0.083% IN NEBU
2.5000 mg | INHALATION_SOLUTION | RESPIRATORY_TRACT | Status: DC | PRN
  Administered 2019-06-10 – 2019-06-22 (×5): 2.5 mg via RESPIRATORY_TRACT
  Filled 2019-06-09 (×4): qty 3

## 2019-06-09 MED ORDER — JEVITY 1.2 CAL PO LIQD
1000.0000 mL | ORAL | Status: DC
  Administered 2019-06-09 – 2019-06-11 (×4): 1000 mL
  Filled 2019-06-09 (×12): qty 1000

## 2019-06-09 MED ORDER — DEXTROSE 5 % IV SOLN: INTRAVENOUS | Status: AC

## 2019-06-09 MED ORDER — LEVOFLOXACIN 500 MG PO TABS
500.0000 mg | ORAL_TABLET | Freq: Every day | ORAL | Status: DC
  Administered 2019-06-09: 500 mg via JEJUNOSTOMY
  Filled 2019-06-09 (×2): qty 5

## 2019-06-09 MED ORDER — ENOXAPARIN SODIUM 150 MG/ML ~~LOC~~ SOLN
130.0000 mg | Freq: Two times a day (BID) | SUBCUTANEOUS | Status: DC
  Administered 2019-06-09 – 2019-06-12 (×7): 130 mg via SUBCUTANEOUS
  Filled 2019-06-09 (×8): qty 0.87

## 2019-06-09 MED ORDER — LEVOFLOXACIN 500 MG PO TABS: 500.0000 mg | ORAL_TABLET | Freq: Every day | ORAL | 0 refills | Status: DC

## 2019-06-09 MED ORDER — ALBUTEROL SULFATE (2.5 MG/3ML) 0.083% IN NEBU
2.5000 mg | INHALATION_SOLUTION | Freq: Three times a day (TID) | RESPIRATORY_TRACT | Status: DC
  Administered 2019-06-10 – 2019-06-13 (×9): 2.5 mg via RESPIRATORY_TRACT
  Filled 2019-06-09 (×9): qty 3

## 2019-06-09 NOTE — Progress Notes (Signed)
CSW received consult for discharge needs. Per PT, patient not wanting SNF placement. TOC Team will need to follow up after goals of care established.   Kyle Locus [name] LCSW [phone]

## 2019-06-09 NOTE — TOC Transition Note (Signed)
Transition of Care [facility]) - CM/SW Discharge Note   Patient Details  Name: Kyle [name] MRN: [id number] Date of Birth: 01-Mar-1956  Transition of Care [facility]) CM/SW Contact:  Benard Halsted, LCSW Phone Number: 06/09/2019, 5:08 PM   Clinical Narrative:    Patient will DC to: Home Anticipated DC date: 06/09/19 Family notified: Left vm for cousin Youlanda Mighty by: Corey Harold   Per MD patient ready for DC to Home. RN, patient, and patient's family notified of DC. DC packet on chart. Ambulance transport requested for patient.   CSW will sign off for now as social work intervention is no longer needed. Please consult [place] again if new needs arise.  Cedric Fishman, LCSW Clinical Social Worker [phone]    Final next level of care: Home w Home Health Services Barriers to Discharge: Barriers Resolved   Patient Goals and CMS Choice Patient states their goals for this hospitalization and ongoing recovery are:: Pt requesting to return home CMS Medicare.gov Compare Post Acute Care list provided to:: Patient Choice offered to / list presented to : Patient  Discharge Placement                Patient to be transferred to facility by: PTAR   Patient and family notified of of transfer: 06/09/19  Discharge Plan and Services In-house Referral: Clinical Social Work Discharge Planning Services: CM Consult Post Acute Care Choice: Home Health          DME Arranged: N/A         HH Arranged: OT, PT, RN [place] Agency: [place] Date [place]: 06/09/19 Time [place]: [id number] Representative spoke with at [place]: [place] ([place]) Interventions     Readmission Risk Interventions No flowsheet data found.

## 2019-06-09 NOTE — Progress Notes (Signed)
ANTICOAGULATION CONSULT NOTE - Initial Consult  Pharmacy Consult for Lovenox Indication: atrial fibrillation  Allergies  Allergen Reactions  . Vioxx [Rofecoxib] Rash    Patient Measurements: Height: 6\' 3"  (190.5 cm) Weight: (!) 302 lb 14.6 oz (137.4 kg) IBW/kg (Calculated) : 84.5  Vital Signs: Temp: 98.4 F (36.9 C) (12/24 2335) Temp Source: Oral (12/24 2335) BP: 133/63 (12/24 2335) Pulse Rate: 92 (12/24 2335)  Labs: Recent Labs    06/08/19 1855 06/08/19 2019 06/09/19 0014  HGB 10.7*  --  10.8*  HCT 36.0*  --  35.4*  PLT 315  --  279  CREATININE 1.12  --  1.04  TROPONINIHS 15 16  --     Estimated Creatinine Clearance: 111.5 mL/min (by C-G formula based on SCr of 1.04 mg/dL).   Medical History: Past Medical History:  Diagnosis Date  . Arthritis   . Atrial fibrillation    HX OF SICK SINUS SYNDROME-ATRIAL FIB  . Back pain, chronic    PT STATES 5 BULGING DISKS AND PINCHED NERVES--PT ON OXYCODONE 4 TIMES A DAY FOR HIS PAIN  . Chest pain 08/26/2015  . Chronic airway obstruction, not elsewhere classified 12/21/2012  . GERD (gastroesophageal reflux disease)   . Hypertension   . Impaired fasting glucose 04/19/2013  . Peripheral vascular disease (HCC)    CHRONIC VENOUS INSUFFICIENCY  . Shortness of breath   . 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 Stroke ([place])   . Wears dentures     Medications:  Medications Prior to Admission  Medication Sig Dispense Refill Last Dose  . albuterol (PROVENTIL HFA;VENTOLIN HFA) 108 (90 Base) MCG/ACT inhaler Inhale 2 puffs into the lungs every 4 (four) hours as needed for wheezing or shortness of breath. 1 Inhaler 5   . Amino Acids-Protein Hydrolys (FEEDING SUPPLEMENT, PRO-STAT SUGAR FREE 64,) LIQD Place 30 mLs into feeding tube 3 (three) times daily. 887 mL 0   . enoxaparin (LOVENOX) 150 MG/ML injection Inject 0.87 mLs (130 mg total) into the skin every 12  (twelve) hours. 0 mL    . gabapentin (NEURONTIN) 250 MG/5ML solution Place 12 mLs (600 mg total) into feeding tube 4 (four) times daily.  12   . metoprolol tartrate (LOPRESSOR) 25 mg/10 mL SUSP Place 5 mLs (12.5 mg total) into feeding tube 2 (two) times daily.     . Nutritional Supplements (FEEDING SUPPLEMENT, JEVITY 1.2 CAL,) LIQD Place 1,000 mLs into feeding tube continuous.  0   . oxyCODONE (ROXICODONE) 5 MG/5ML solution Place 7.5 mLs (7.5 mg total) into feeding tube every 3 (three) hours as needed for severe pain or breakthrough pain. 473 mL 0   . pantoprazole sodium (PROTONIX) 40 mg/20 mL PACK Place 20 mLs (40 mg total) into feeding tube daily. 30 mL    . Water For Irrigation, Sterile (FREE WATER) SOLN Place 100 mLs into feeding tube every 6 (six) hours.      Scheduled:  . albuterol  2.5 mg Nebulization Q6H  . feeding supplement (PRO-STAT SUGAR FREE 64)  30 mL Per Tube TID  . free water  100 mL Per Tube Q6H  . gabapentin  600 mg Per Tube QID  . metoprolol tartrate  12.5 mg Per Tube BID  . pantoprazole sodium  40 mg Per Tube Daily   Infusions:  . ampicillin-sulbactam (UNASYN) IV 3 g (06/09/19 0116)  . feeding supplement (JEVITY 1.2 CAL)  Assessment: 63yo male signed himself out of SNF >> drank Sprite at home (has J-tube) and aspirated >> admitted for aspiration PNA, to continue anticoag for Afib; pt is supposed to be on Lovenox 130mg  SQ BID though MAR has not yet been furnished so last dose unclear.  Goal of Therapy:  Anti-Xa level 0.6-1 units/ml 4hrs after LMWH dose given Monitor platelets by anticoagulation protocol: Yes   Plan:  Will continue Lovenox 130mg  SQ Q12H and monitor CBC.  Wynona Neat, PharmD, BCPS  06/09/2019,2:04 AM

## 2019-06-09 NOTE — TOC Initial Note (Addendum)
Transition of Care [facility]) - Initial/Assessment Note    Patient Details  Name: Kyle [name] MRN: [id number] Date of Birth: 04/03/1956  Transition of Care [facility]) CM/SW Contact:    Benard Halsted, LCSW Phone Number: 06/09/2019, 3:35 PM  Clinical Narrative:                 3:35pm-Patient adamantly refusing SNF placement and requests discharge home. Patient is accepting of home health services. CSW set patient up with [facility] home health and will arrange PTAR for transport home to roommate as patient is unable to ambulate. Per RN, patient reporting he is unable to pick up his medications and his roommate is not either. CSW left voicemail for patient's cousin, Legrand Como with patient's permission. RNCM contacted pharmacy to see if they could assist with meds but they reported they could not. TOC Supervisor, Marya Amsler, following up.   4pm-Hospital pharmacy able to provide Levaquin. RN sent script to them and delivered meds to patient.   Expected Discharge Plan: [place] Services Barriers to Discharge: Other (comment)(Cannot access medications at pharmacy today)   Patient Goals and CMS Choice Patient states their goals for this hospitalization and ongoing recovery are:: Pt requesting to return home CMS Medicare.gov Compare Post Acute Care list provided to:: Patient Choice offered to / list presented to : Patient  Expected Discharge Plan and Services Expected Discharge Plan: [place] In-house Referral: Clinical Social Work Discharge Planning Services: CM Consult Post Acute Care Choice: [place] arrangements for the past 2 months: [place] Expected Discharge Date: 06/09/19                         HH Arranged: PT, RN [place] Agency: [place] Date [facility] Agency Contacted: 06/09/19 Time [place]: [id number] Representative spoke with at [place]: Georgina Snell  Prior Living Arrangements/Services Living arrangements for the past 2 months:  [place] with:: Roommate Patient language and need for interpreter reviewed:: Yes Do you feel safe going back to the place where you live?: Yes      Need for Family Participation in Patient Care: Yes (Comment) Care giver support system in place?: Yes (comment)   Criminal Activity/Legal Involvement Pertinent to Current Situation/Hospitalization: No - Comment as needed  Activities of Daily Living      Permission Sought/Granted Permission sought to share information with : Facility Sport and exercise psychologist, Family Supports Permission granted to share information with : Yes, Verbal Permission Granted  Share Information with NAME: Legrand Como  Permission granted to share info w AGENCY: [place] granted to share info w Relationship: Cousin  Permission granted to share info w Contact Information: [phone]  Emotional Assessment Appearance:: Appears stated age Attitude/Demeanor/Rapport: Engaged Affect (typically observed): Accepting, Appropriate Orientation: : Oriented to Self, Oriented to Place, Oriented to  Time, Oriented to Situation Alcohol / Substance Use: Not Applicable Psych Involvement: No (comment)  Admission diagnosis:  Dysphagia due to recent stroke [I69.391] Aspiration into airway [T17.908A] Aspiration pneumonia, unspecified aspiration pneumonia type, unspecified laterality, unspecified part of lung ([place]) [J69.0] Patient Active Problem List   Diagnosis Date Noted  . Aspiration into airway 06/08/2019  . Aspiration pneumonia ([place])   . Dysphagia due to recent cerebrovascular accident 05/18/2019  . Tobacco use disorder 05/18/2019  . Chronic pain syndrome 05/18/2019  . Stroke ([place]) R MCA emb d/t AF not on [facility] s/p partial revascularization w/ IR 04/30/2019  . Middle  cerebral artery embolism, right 04/30/2019  . Chronic atrial fibrillation ([place]) 08/26/2015  . Hyperlipidemia LDL goal <100 06/26/2014  . Routine general medical examination at a health  care facility 11/21/2013  . Screening PSA (prostate specific antigen) 11/21/2013  . Special screening for malignant neoplasms, colon 11/21/2013  . OSA (obstructive sleep apnea) 08/22/2013  . Morbid obesity ([place]) 04/19/2013  . Other emphysema ([place]) 01/31/2013  . Essential hypertension 12/21/2012  . Insomnia 12/21/2012  . GERD (gastroesophageal reflux disease) 09/20/2012  . Back pain, chronic 09/20/2012  . Obesity hypoventilation syndrome ([place]) 09/20/2012  . Adjustment disorder with mixed anxiety and depressed mood 09/20/2012   PCP:  [name], MD Pharmacy:   [id number] [place], [place] [address] Phone: [phone] Fax: [place], [place] - [address] [place] [address] [place] [address] Phone: [phone] Fax: [phone]  [id number] - [place], [place] [id number] [place] [id number] Phone: [phone] Fax: [phone]     Social Determinants of Health (SDOH) Interventions    Readmission Risk Interventions No flowsheet data found.
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
## 2019-06-09 NOTE — Discharge Summary (Addendum)
Physician Discharge Summary  Kyle [name] [id number] DOB: September 12, 1955 DOA: 06/08/2019  PCP: [name], MD  Admit date: 06/08/2019 Discharge date: 06/09/2019  Time spent: 45 minutes   Recommendations for Outpatient Follow-up:  1. PCP follow-up within one week - pt will need CBC and BMP within 5-7 days to reassess for   WBC should continue trending down  Follow anemia. Retic Ct elevated, Iron low, Ferritin is ok, no s/s bleeding  Sodium elevated, pt is clinically dry -hydration via PEG was encouraged 1. Pt complains of foot pain, PCP may consider podiatry consult 2. Neuro to follow, will continue Lovenox until they recommend otherwise  3. Pt advised extensively on aspiration precautions    Pt instructions:  Need to follow up with PCP and neurologist. PCP can further evaluate foot pain / refer to podiatry. All home medications continued. DO NOT try to eat or drink by mouth. This puts you at risk of choking and getting food/liquid into the lungs which can cause infection and death.     Discharge Diagnoses:  Principal Problem:   Aspiration into airway Active Problems:   Essential hypertension   Chronic atrial fibrillation (HCC)   Dysphagia due to recent cerebrovascular accident   Aspiration pneumonia [facility])   Discharge Condition: good, stable  Diet recommendation: per PEG. Pt is NPO!   Filed Weights   06/08/19 2335  Weight: (!) 137.4 kg    History of present illness:  Kyle [name] is a 63 y.o. male with history of atrial fibrillation, hypertension, COPD was recently admitted for acute CVA underwent mechanical thrombectomy for right MCA infarct with patient having dysphagia and difficulty speaking had to undergo PEG tube placement and was discharged to rehab when patient signed out himself [place] and went home and try to eat when he aspirated and became short of breath and EMS was called.  Patient was brought to the ER.  Denies any chest pain shortness  of breath abdominal pain or diarrhea.  ED Course: In the ER patient was afebrile Covid test was negative.  Chest x-ray was unremarkable patient was requiring 3 L oxygen.  Patient was having loud rhonchorous sound on exam and was having a lot of secretion and acid to be constantly aspirated.  Labs show sodium 146 WBC 18.4 hemoglobin 10.7 EKG shows A. fib rate controlled.  Given the shortness of breath with patient requiring 3 L oxygen patient has been admitted for further observation for aspiration.  Patient was started on Unasyn empirically.   Hospital course: CXR looked ok, no evidence of pneumonia. WBC trending down. Will keep on Abx upon discharge as precaution given elevated WBC and aspiration risk.     Procedures:  none  Consultations:  none  Discharge Exam: Vitals:   06/08/19 2335 06/09/19 0755  BP: 133/63 111/61  Pulse: 92 94  Resp: 20   Temp: 98.4 F (36.9 C) 97.7 F (36.5 C)  SpO2: 98% 99%    General: NAD, alert Cardiovascular: S1S2 RRR Respiratory: CTABL    Discharge Instructions    Amb Referral to Palliative Care   Complete by: As directed    Call MD for:  difficulty breathing, headache or visual disturbances   Complete by: As directed    Call MD for:  severe uncontrolled pain   Complete by: As directed    Call MD for:  temperature >100.4   Complete by: As directed    Diet - low sodium heart healthy   Complete by: As directed  Discharge instructions   Complete by: As directed    Need to follow up with PCP and neurologist. PCP can further evaluate foot pain / refer to podiatry. All home medications continued. DO NOT try to eat or drink by mouth. This puts you at risk of choking and getting food/liquid into the lungs which can cause infection and death.   Increase activity slowly   Complete by: As directed      Allergies as of 06/09/2019      Reactions   Vioxx [rofecoxib] Rash      Medication List    TAKE these medications   albuterol 108 (90  Base) MCG/ACT inhaler Commonly known as: VENTOLIN HFA Inhale 2 puffs into the lungs every 4 (four) hours as needed for wheezing or shortness of breath.   enoxaparin 150 MG/ML injection Commonly known as: LOVENOX Inject 0.87 mLs (130 mg total) into the skin every 12 (twelve) hours.   feeding supplement (JEVITY 1.2 CAL) Liqd Place 1,000 mLs into feeding tube continuous.   feeding supplement (PRO-STAT SUGAR FREE 64) Liqd Place 30 mLs into feeding tube 3 (three) times daily.   free water Soln Place 100 mLs into feeding tube every 6 (six) hours.   gabapentin 250 MG/5ML solution Commonly known as: NEURONTIN Place 12 mLs (600 mg total) into feeding tube 4 (four) times daily.   levofloxacin 500 MG tablet Commonly known as: Levaquin 1 tablet (500 mg total) by Per J Tube route daily for 5 days.   metoprolol tartrate 25 mg/10 mL Susp Commonly known as: LOPRESSOR Place 5 mLs (12.5 mg total) into feeding tube 2 (two) times daily.   oxyCODONE 5 MG/5ML solution Commonly known as: ROXICODONE Place 7.5 mLs (7.5 mg total) into feeding tube every 3 (three) hours as needed for severe pain or breakthrough pain.   pantoprazole sodium 40 mg/20 mL Pack Commonly known as: PROTONIX Place 20 mLs (40 mg total) into feeding tube daily.      Allergies  Allergen Reactions  . Vioxx [Rofecoxib] Rash       The results of significant diagnostics from this hospitalization (including imaging, microbiology, ancillary and laboratory) are listed below for reference.    Significant Diagnostic Studies: IR Replc Duoden/Jejuno Tube Percut W/Fluoro  Result Date: 06/05/2019 INDICATION: Post stroke, with recent surgically placed jejunostomy catheter, became removed. A temporary catheter placed in the tract has also been removed. EXAM: IR REPLACE DUODEN/JEJUNO TUBE PERCUT WITH FLUORO MEDICATIONS: No antibiotics were indicated ANESTHESIA/SEDATION: Viscous lidocaine, topically CONTRAST:  91mL OMNIPAQUE IOHEXOL  300 MG/ML SOLN - administered into the gastric lumen. PROCEDURE: Informed written consent was obtained from the patient after a thorough discussion of the procedural risks, benefits and alternatives. All questions were addressed. Maximal Sterile Barrier Technique was utilized including caps, mask, sterile gowns, sterile gloves, sterile drape, hand hygiene and skin antiseptic. A timeout was performed prior to the initiation of the procedure. Under fluoroscopic guidance, a short angled Kumpe catheter was advanced through the jejunostomy tract into the small bowel. Contrast injection confirmed appropriate positioning. Catheter exchanged over short Amplatz wire for vascular dilator which facilitated placement 16 French balloon retention single-lumen jejunostomy catheter. Small contrast injection confirmed appropriate positioning and patency. Catheter was flushed and capped. The patient tolerated the procedure well. FLUOROSCOPY TIME:  1.9 minute; 0000000 uGym2 DAP COMPLICATIONS: None immediate. IMPRESSION: 1. Technically successful 16 French single-lumen jejunostomy catheter replacement under fluoroscopy. Electronically Signed   By: Lucrezia Europe M.D.   On: 06/05/2019 13:58   DG Chest  Port 1 View  Result Date: 06/08/2019 CLINICAL DATA:  Shortness of breath EXAM: PORTABLE CHEST 1 VIEW COMPARISON:  Portable exam 1759 hours compared to 06/02/2019 FINDINGS: Rotated to the RIGHT. Normal heart size, mediastinal contours, and pulmonary vascularity. Chronic accentuation of basilar interstitial markings, stable. No definite acute infiltrate, pleural effusion or pneumothorax. Osseous structures unremarkable. IMPRESSION: No acute abnormalities. Electronically Signed   By: Lavonia Dana M.D.   On: 06/08/2019 18:19   DG CHEST PORT 1 VIEW  Result Date: 05/13/2019 CLINICAL DATA:  Cardiomegaly EXAM: PORTABLE CHEST 1 VIEW COMPARISON:  April 30, 2019 FINDINGS: Cardiomegaly is stable. The pulmonary vascular is normal. There is aortic  atherosclerosis. There is no evident edema or consolidation. No adenopathy. Feeding tube tip is below the diaphragm. No bone lesions appreciable. IMPRESSION: Stable cardiomegaly. No edema or consolidation. Feeding tube extends below the diaphragm. Aortic Atherosclerosis (ICD10-I70.0). Electronically Signed   By: Lowella Grip III M.D.   On: 05/13/2019 08:29    Microbiology: Recent Results (from the past 240 hour(s))  Culture, blood (routine x 2)     Status: None (Preliminary result)   Collection Time: 06/08/19  6:55 PM   Specimen: BLOOD  Result Value Ref Range Status   Specimen Description BLOOD LEFT ANTECUBITAL  Final   Special Requests   Final    BOTTLES DRAWN AEROBIC AND ANAEROBIC Blood Culture results may not be optimal due to an inadequate volume of blood received in culture bottles   Culture   Final    NO GROWTH < 12 HOURS Performed at [facility] [address], [place], [place] [address]    Report Status PENDING  Incomplete  Respiratory Panel by RT PCR (Flu A&B, Covid) - Nasopharyngeal Swab     Status: None   Collection Time: 06/08/19  7:00 PM   Specimen: Nasopharyngeal Swab  Result Value Ref Range Status   SARS Coronavirus 2 by RT PCR NEGATIVE NEGATIVE Final    Comment: (NOTE) SARS-CoV-2 target nucleic acids are NOT DETECTED. The SARS-CoV-2 RNA is generally detectable in upper respiratoy specimens during the acute phase of infection. The lowest concentration of SARS-CoV-2 viral copies this assay can detect is 131 copies/mL. A negative result does not preclude SARS-Cov-2 infection and should not be used as the sole basis for treatment or other patient management decisions. A negative result may occur with  improper specimen collection/handling, submission of specimen other than nasopharyngeal swab, presence of viral mutation(s) within the areas targeted by this assay, and inadequate number of viral copies (<131 copies/mL). A negative result must be combined with  clinical observations, patient history, and epidemiological information. The expected result is Negative. Fact Sheet for Patients:  PinkCheek.be Fact Sheet for Healthcare Providers:  GravelBags.it This test is not yet ap proved or cleared by the [place] FDA and  has been authorized for detection and/or diagnosis of SARS-CoV-2 by FDA under an Emergency Use Authorization (EUA). This EUA will remain  in effect (meaning this test can be used) for the duration of the COVID-19 declaration under Section 564(b)(1) of the Act, 21 U.S.C. section 360bbb-3(b)(1), unless the authorization is terminated or revoked sooner.    Influenza A by PCR NEGATIVE NEGATIVE Final   Influenza B by PCR NEGATIVE NEGATIVE Final    Comment: (NOTE) The Xpert Xpress SARS-CoV-2/FLU/RSV assay is intended as an aid in  the diagnosis of influenza from Nasopharyngeal swab specimens and  should not be used as a sole basis for treatment. Nasal washings and  aspirates are unacceptable for Xpert Xpress SARS-CoV-2/FLU/RSV  testing. Fact Sheet for Patients: PinkCheek.be Fact Sheet for Healthcare Providers: GravelBags.it This test is not yet approved or cleared by the [place] FDA and  has been authorized for detection and/or diagnosis of SARS-CoV-2 by  FDA under an Emergency Use Authorization (EUA). This EUA will remain  in effect (meaning this test can be used) for the duration of the  Covid-19 declaration under Section 564(b)(1) of the Act, 21  U.S.C. section 360bbb-3(b)(1), unless the authorization is  terminated or revoked. Performed at [facility], [place] [address], [place], [place] [address]      Labs: Basic Metabolic Panel: Recent Labs  Lab 06/08/19 1855 06/09/19 0014  NA 146* 149*  K 4.3 3.7  CL 108 109  CO2 26 26  GLUCOSE 108* 105*  BUN 50* 46*  CREATININE 1.12 1.04   CALCIUM 9.2 9.2   Liver Function Tests: No results for input(s): AST, ALT, ALKPHOS, BILITOT, PROT, ALBUMIN in the last 168 hours. No results for input(s): LIPASE, AMYLASE in the last 168 hours. No results for input(s): AMMONIA in the last 168 hours. CBC: Recent Labs  Lab 06/08/19 1855 06/09/19 0014  WBC 18.4* 16.5*  NEUTROABS 14.8*  --   HGB 10.7* 10.8*  HCT 36.0* 35.4*  MCV 96.8 93.4  PLT 315 279   Cardiac Enzymes: No results for input(s): CKTOTAL, CKMB, CKMBINDEX, TROPONINI in the last 168 hours. BNP: BNP (last 3 results) Recent Labs    06/08/19 1855  BNP 82.3    ProBNP (last 3 results) No results for input(s): PROBNP in the last 8760 hours.  CBG: Recent Labs  Lab 06/09/19 0752  GLUCAP 121*       Signed:  Emeterio Reeve MD.  [facility] 06/09/2019, 2:43 PM

## 2019-06-10 ENCOUNTER — Observation Stay (HOSPITAL_COMMUNITY): Payer: Medicare PPO

## 2019-06-10 DIAGNOSIS — T17908A Unspecified foreign body in respiratory tract, part unspecified causing other injury, initial encounter: Secondary | ICD-10-CM | POA: Diagnosis not present

## 2019-06-10 LAB — CBC
HCT: 30.2 % — ABNORMAL LOW (ref 39.0–52.0)
Hemoglobin: 9.2 g/dL — ABNORMAL LOW (ref 13.0–17.0)
MCH: 28.7 pg (ref 26.0–34.0)
MCHC: 30.5 g/dL (ref 30.0–36.0)
MCV: 94.1 fL (ref 80.0–100.0)
Platelets: 253 10*3/uL (ref 150–400)
RBC: 3.21 MIL/uL — ABNORMAL LOW (ref 4.22–5.81)
RDW: 19.9 % — ABNORMAL HIGH (ref 11.5–15.5)
WBC: 16 10*3/uL — ABNORMAL HIGH (ref 4.0–10.5)
nRBC: 0.3 % — ABNORMAL HIGH (ref 0.0–0.2)

## 2019-06-10 LAB — GLUCOSE, CAPILLARY
Glucose-Capillary: 127 mg/dL — ABNORMAL HIGH (ref 70–99)
Glucose-Capillary: 130 mg/dL — ABNORMAL HIGH (ref 70–99)
Glucose-Capillary: 79 mg/dL (ref 70–99)

## 2019-06-10 IMAGING — CR DG KNEE COMPLETE 4+V*R*
4 series · 4 of 4 positions shown · non-contrast
Comparison: Right femur radiographs [DATE]. Knee radiographs
[DATE].

CLINICAL DATA: Right knee pain and bruising.

EXAM:
RIGHT KNEE - COMPLETE 4+ VIEW

[knee ap]
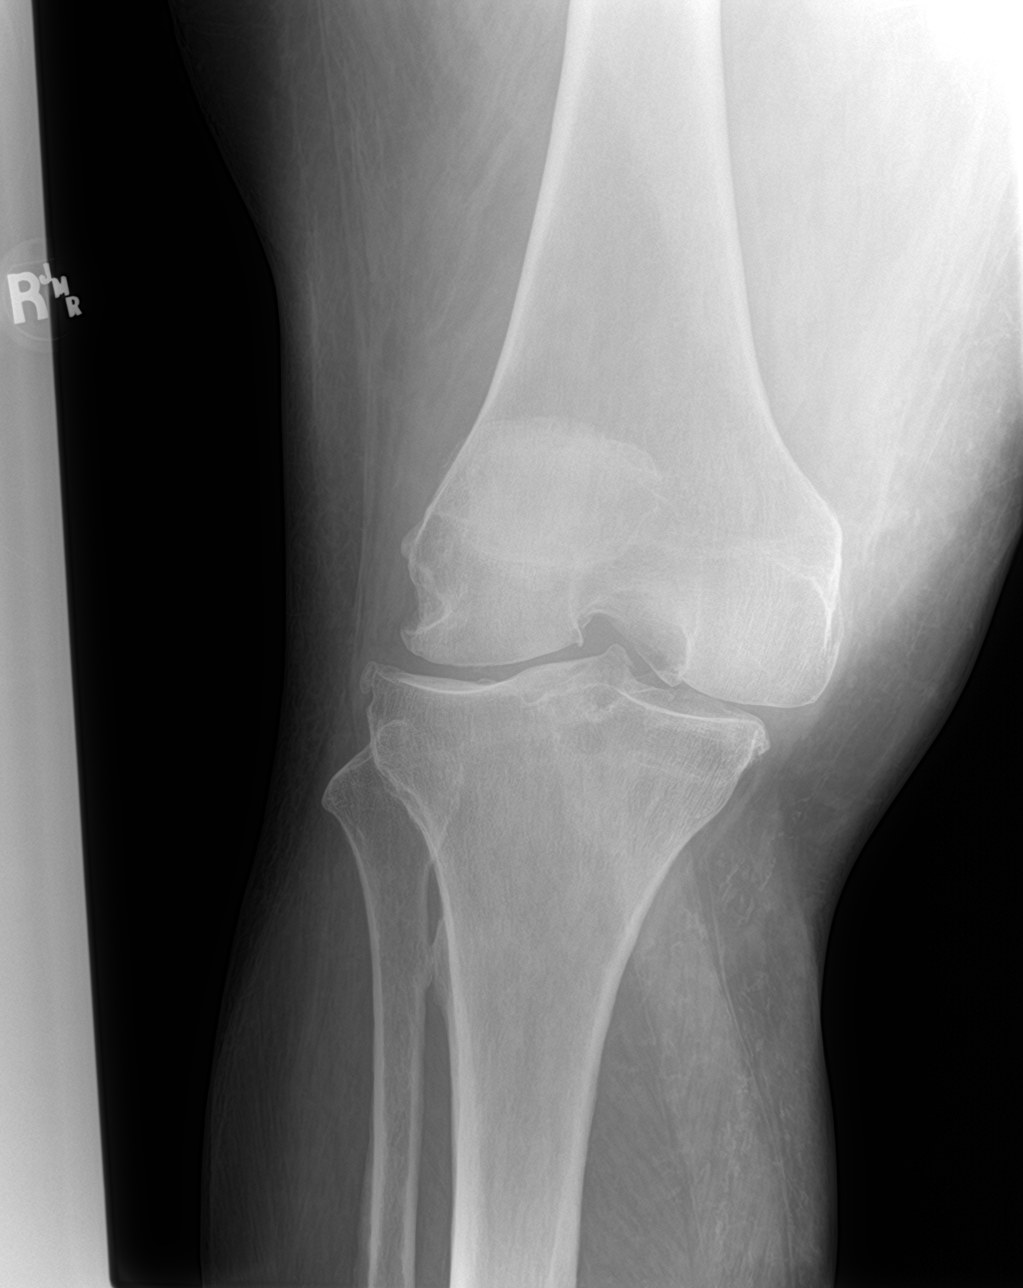

[knee obl (1 of 2)]
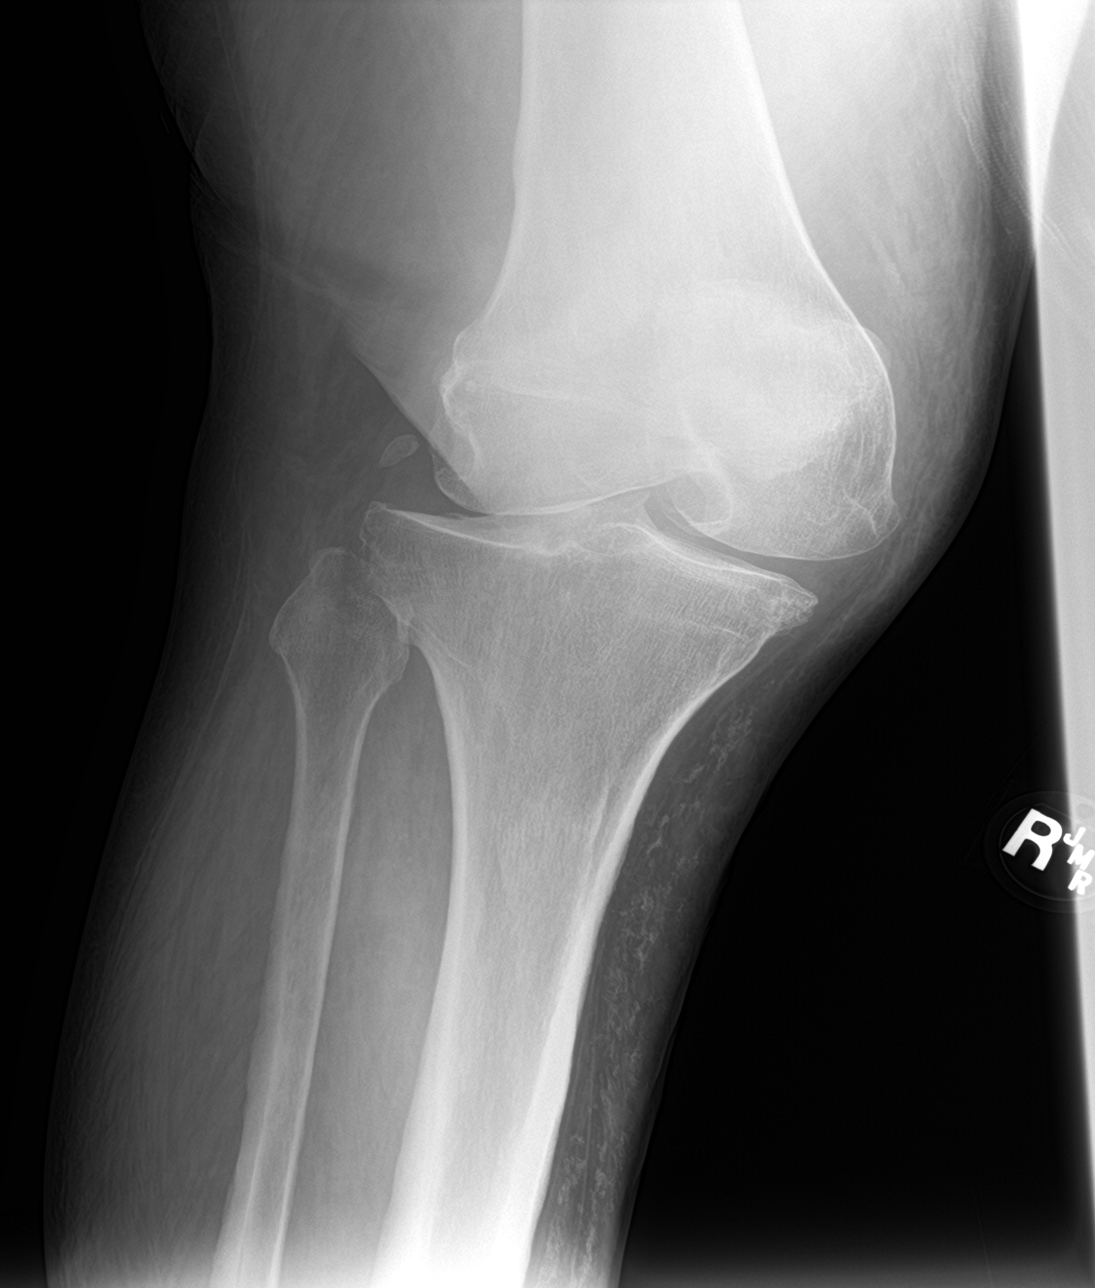

[knee lat]
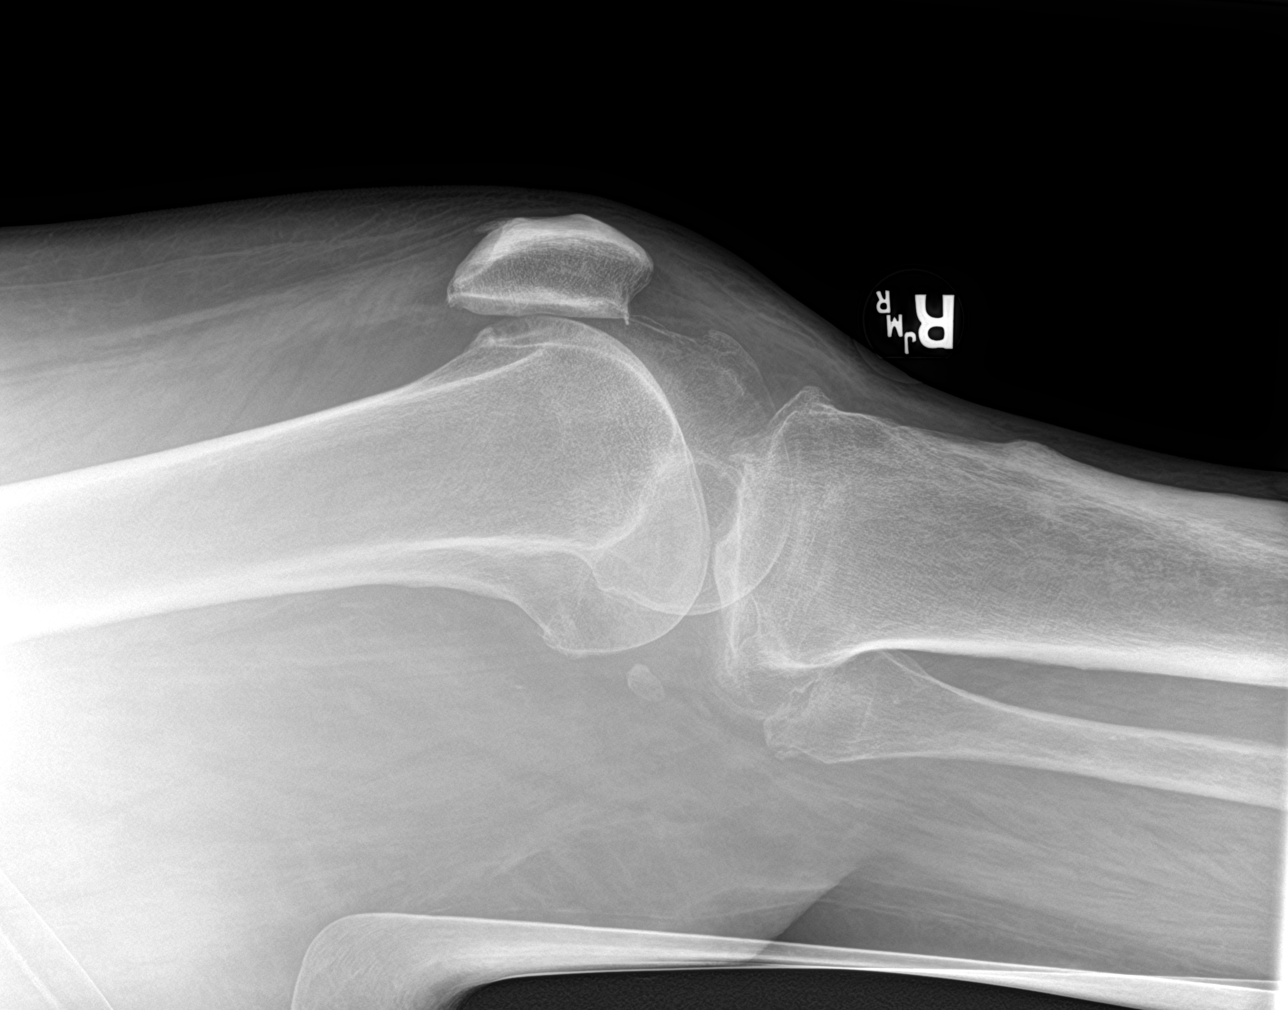

[knee obl (2 of 2)]
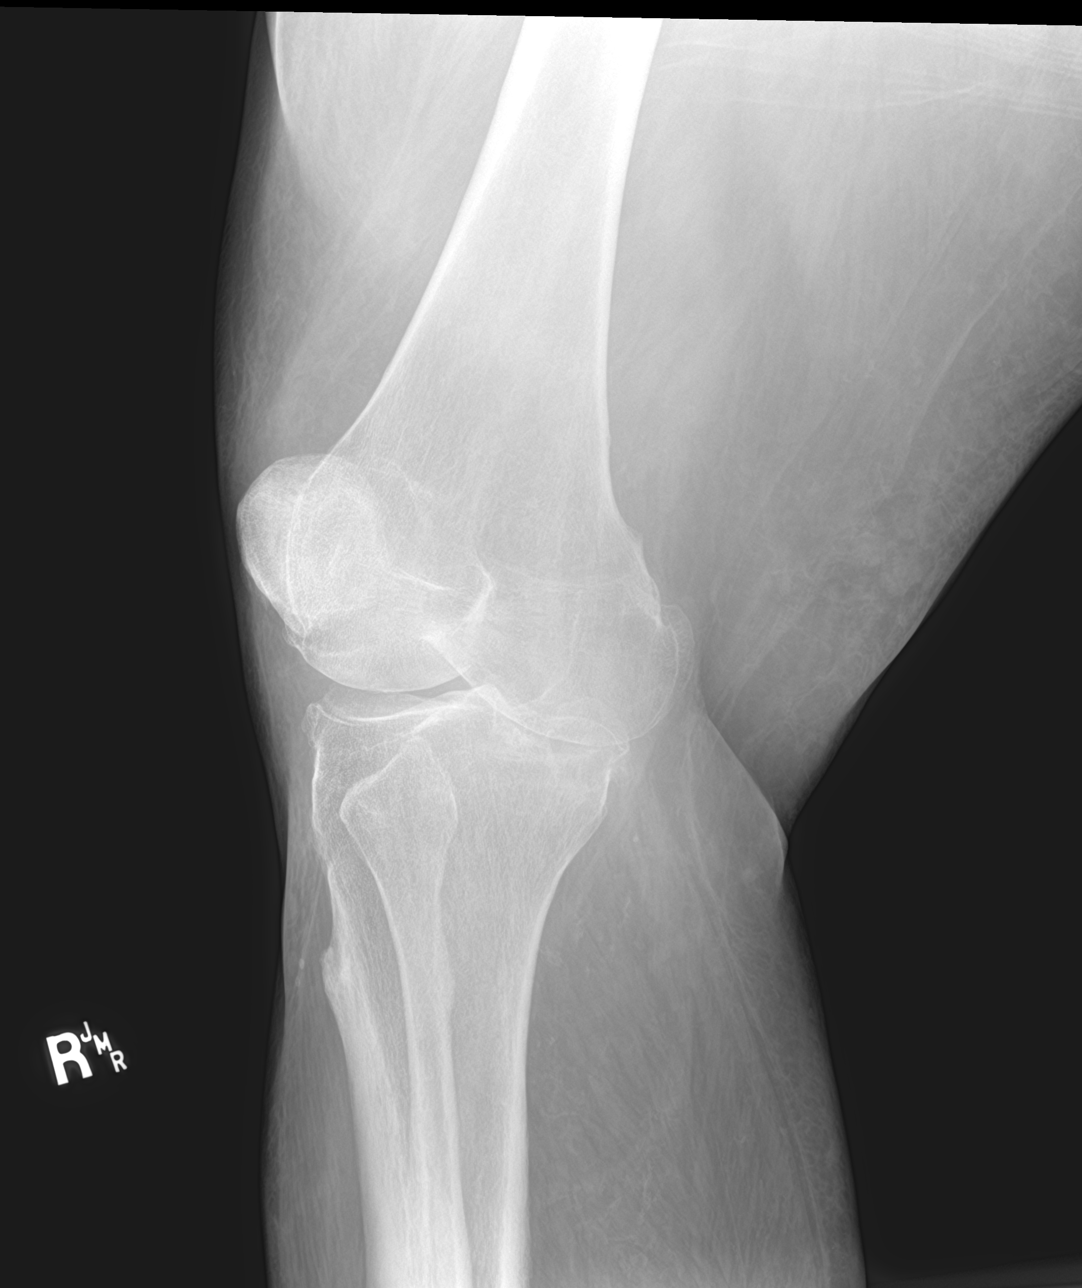

[4 of 4 positions shown; findings below may reference images not displayed]

FINDINGS: No evidence of acute fracture or dislocation. Relative to the distal
femur, there is mild lateral subluxation of the tibia, similar to
prior radiographs. Moderate tricompartmental degenerative changes
are present with joint space narrowing and osteophytes. No
significant joint effusion. Soft tissue calcifications are present
medially in the proximal lower leg.
IMPRESSION: No acute osseous findings. Tricompartmental degenerative changes
with mild lateral subluxation of the tibia, similar to prior
radiographs.

## 2019-06-10 MED ORDER — SERTRALINE HCL 20 MG/ML PO CONC
50.0000 mg | Freq: Every day | ORAL | Status: DC
  Filled 2019-06-10: qty 2.5

## 2019-06-10 MED ORDER — CYCLOBENZAPRINE HCL 10 MG PO TABS
10.0000 mg | ORAL_TABLET | Freq: Three times a day (TID) | ORAL | Status: DC | PRN
  Administered 2019-06-10 – 2019-06-23 (×7): 10 mg via ORAL
  Filled 2019-06-10 (×8): qty 1

## 2019-06-10 MED ORDER — ACETAMINOPHEN 500 MG PO TABS: 1000.0000 mg | ORAL_TABLET | Freq: Once | ORAL | Status: DC

## 2019-06-10 MED ORDER — SERTRALINE HCL 20 MG/ML PO CONC: 50.0000 mg | Freq: Every day | ORAL | 12 refills | Status: DC

## 2019-06-10 MED ORDER — CYCLOBENZAPRINE HCL 10 MG PO TABS: 10.0000 mg | ORAL_TABLET | Freq: Three times a day (TID) | ORAL | 3 refills | Status: DC | PRN

## 2019-06-10 MED ORDER — SERTRALINE HCL 50 MG PO TABS
50.0000 mg | ORAL_TABLET | Freq: Every day | ORAL | Status: DC
  Administered 2019-06-10 – 2019-06-23 (×14): 50 mg
  Filled 2019-06-10 (×15): qty 1

## 2019-06-10 NOTE — Progress Notes (Signed)
PROGRESS NOTE    Kyle [name]  [id number] DOB: Nov 10, 1955 DOA: 06/08/2019 PCP: [name], MD    Brief Narrative:   Pt with history of atrial fibrillation, hypertension, COPD was recently admitted for acute CVA underwent mechanical thrombectomy for right MCA infarct with patient having dysphagia and difficulty speaking had to undergo PEG tube placement and was discharged to rehab when patient signed out himself [place] and went home and try to eat when he aspirated and became short of breath and EMS was called.  Patient was brought to the ER.  Denies any chest pain shortness of breath abdominal pain or diarrhea.  ED Course: In the ER patient was afebrile Covid test was negative.  Chest x-ray was unremarkable patient was requiring 3 L oxygen.  Patient was having loud rhonchorous sound on exam and was having a lot of secretion and acid to be constantly aspirated.  Labs show sodium 146 WBC 18.4 hemoglobin 10.7 EKG shows A. fib rate controlled.  Given the shortness of breath with patient requiring 3 L oxygen patient has been admitted for further observation for aspiration.  Patient was started on Unasyn empirically.  06/10/19 Attempted d/c yesterday and then there were questions about tube feeds and meds. Meds given, but HH not set up. Awaiting today. He reports that he has pain on right leg with large bruise on that knee. Asks for hot towels. Also, reports his nerves are shot and that he would like to see a preacher.  Assessment & Plan:   Principal Problem:   Aspiration into airway Active Problems:   Essential hypertension   Chronic atrial fibrillation (HCC)   Dysphagia due to recent cerebrovascular accident   Aspiration pneumonia ([place])   1. Acute respiratory failure with hypoxia secondary to aspiration into the airway with history of dysphagia from recent stroke on PEG tube placement -patient had aspiration into the airway after patient attempted to eat. Improved  WBC, negative CXR, on Unasyn, remains afebrile 2. Persistent A. fib for which patient is on Lovenox and metoprolol. 3. Recent stroke on Lovenox. Status post thrombectomy during recent admission for stroke. Still with communication issues. Will consult palliative care today. 4. Anemia appears to be new.  Will check anemia panel with next blood draw. WNL Follow CBC closely. hgb stable 5. Has had right knee erythema seen but patient is not having any pain or is not warm to touch.  Closely observe. Reporting increasing pain and large ecchymosis noted, will check x-ray. Kpad to knee, leg 6. Dysphagia on PEG tube diet. Continue and try to arrange home health today--attempted to call landlord, call goes straight to voice mail. 7. New onset depression with some anxiety--begin Zoloft 50 mg per G-tube today 8. Chronic pain continue home medications for pain.   DVT prophylaxis: Lovenox Code Status: Full Family Communication: patient Disposition Plan: home   Consultants:   Palliative care  PT  Procedures:   none   Antimicrobials: Anti-infectives (From admission, onward)   Start     Dose/Rate Route Frequency Ordered Stop   06/09/19 1645  levofloxacin (LEVAQUIN) tablet 500 mg  Status:  Discontinued     500 mg Per J Tube Daily 06/09/19 1640 06/09/19 1641   06/09/19 1645  levofloxacin (LEVAQUIN) tablet 500 mg  Status:  Discontinued     500 mg Per J Tube Daily 06/09/19 1642 06/09/19 1934   06/09/19 0000  Ampicillin-Sulbactam (UNASYN) 3 g in sodium chloride 0.9 % 100 mL IVPB  3 g 200 mL/hr over 30 Minutes Intravenous Every 6 hours 06/08/19 2340     06/09/19 0000  levofloxacin (LEVAQUIN) 500 MG tablet  Status:  Discontinued     500 mg Per J Tube Daily 06/09/19 1442 06/09/19    06/09/19 0000  levofloxacin (LEVAQUIN) 500 MG tablet     500 mg Per J Tube Daily 06/09/19 1621 06/14/19 2359   06/08/19 1800  Ampicillin-Sulbactam (UNASYN) 3 g in sodium chloride 0.9 % 100 mL IVPB     3 g 200 mL/hr  over 30 Minutes Intravenous  Once 06/08/19 1754 06/08/19 1856         Subjective: Writing for communication. He is teary, sad. Wants to see chaplain. Reports his knee and hip are "destroyed", "wasn't strapped in" during his G-tube placement. Has large bruise on knee.  Objective: Vitals:   06/09/19 2257 06/09/19 2351 06/10/19 0753 06/10/19 0809  BP: (!) 125/58 (!) 129/59 115/74   Pulse:  88 76 80  Resp:  18 16 20   Temp: 98.4 F (36.9 C) 97.8 F (36.6 C) 98.3 F (36.8 C)   TempSrc: Oral  Oral   SpO2:  94% 100% 100%  Weight:      Height:        Intake/Output Summary (Last 24 hours) at 06/10/2019 1247 Last data filed at 06/10/2019 0753 Gross per 24 hour  Intake 2986.1 ml  Output 0 ml  Net 2986.1 ml   Filed Weights   06/08/19 2335  Weight: (!) 137.4 kg    Examination:  General exam: Appears calm and comfortable  Respiratory system: Clear to auscultation. Respiratory effort normal. Cardiovascular system: S1 & S2 heard, irregular rhythm.  Gastrointestinal system: Abdomen is nondistended, soft and nontender. Central nervous system: Alert and communicates mostly through writing. Can speak some words Extremities: large ecchymosis on right knee Skin: Significant lichenification of right LE Psychiatry: tearful and blunted    Data Reviewed: I have personally reviewed following labs and imaging studies  CBC: Recent Labs  Lab 06/08/19 1855 06/09/19 0014 06/10/19 0817  WBC 18.4* 16.5* 16.0*  NEUTROABS 14.8*  --   --   HGB 10.7* 10.8* 9.2*  HCT 36.0* 35.4* 30.2*  MCV 96.8 93.4 94.1  PLT 315 279 [id number]   Basic Metabolic Panel: Recent Labs  Lab 06/08/19 1855 06/09/19 0014  NA 146* 149*  K 4.3 3.7  CL 108 109  CO2 26 26  GLUCOSE 108* 105*  BUN 50* 46*  CREATININE 1.12 1.04  CALCIUM 9.2 9.2   GFR: Estimated Creatinine Clearance: 111.5 mL/min (by C-G formula based on SCr of 1.04 mg/dL). CBG: Recent Labs  Lab 06/09/19 0752 06/10/19 0754  GLUCAP 121* 79    Anemia Panel: Recent Labs    06/09/19 0702  VITAMINB12 393  FOLATE 12.6  FERRITIN 84  TIBC 231*  IRON 29*  RETICCTPCT 4.2*   Urine analysis:    Component Value Date/Time   COLORURINE YELLOW 06/08/2019 2113   APPEARANCEUR CLEAR 06/08/2019 2113   LABSPEC 1.026 06/08/2019 2113   PHURINE 5.0 06/08/2019 2113   GLUCOSEU NEGATIVE 06/08/2019 2113   HGBUR NEGATIVE 06/08/2019 2113   [place] NEGATIVE 06/08/2019 2113   [place] NEGATIVE 06/08/2019 2113   PROTEINUR 30 (A) 06/08/2019 2113   NITRITE NEGATIVE 06/08/2019 2113   LEUKOCYTESUR NEGATIVE 06/08/2019 2113   Sepsis Labs:  Recent Results (from the past 240 hour(s))  Culture, blood (routine x 2)     Status: None (Preliminary result)   Collection Time: 06/08/19  6:55 PM   Specimen: BLOOD  Result Value Ref Range Status   Specimen Description BLOOD LEFT ANTECUBITAL  Final   Special Requests   Final    BOTTLES DRAWN AEROBIC AND ANAEROBIC Blood Culture results may not be optimal due to an inadequate volume of blood received in culture bottles   Culture   Final    NO GROWTH 2 DAYS Performed at [facility] [address], [place], [place] [address]    Report Status PENDING  Incomplete  Respiratory Panel by RT PCR (Flu A&B, Covid) - Nasopharyngeal Swab     Status: None   Collection Time: 06/08/19  7:00 PM   Specimen: Nasopharyngeal Swab  Result Value Ref Range Status   SARS Coronavirus 2 by RT PCR NEGATIVE NEGATIVE Final    Comment: (NOTE) SARS-CoV-2 target nucleic acids are NOT DETECTED. The SARS-CoV-2 RNA is generally detectable in upper respiratoy specimens during the acute phase of infection. The lowest concentration of SARS-CoV-2 viral copies this assay can detect is 131 copies/mL. A negative result does not preclude SARS-Cov-2 infection and should not be used as the sole basis for treatment or other patient management decisions. A negative result may occur with  improper specimen collection/handling,  submission of specimen other than nasopharyngeal swab, presence of viral mutation(s) within the areas targeted by this assay, and inadequate number of viral copies (<131 copies/mL). A negative result must be combined with clinical observations, patient history, and epidemiological information. The expected result is Negative. Fact Sheet for Patients:  PinkCheek.be Fact Sheet for Healthcare Providers:  GravelBags.it This test is not yet ap proved or cleared by the [place] FDA and  has been authorized for detection and/or diagnosis of SARS-CoV-2 by FDA under an Emergency Use Authorization (EUA). This EUA will remain  in effect (meaning this test can be used) for the duration of the COVID-19 declaration under Section 564(b)(1) of the Act, 21 U.S.C. section 360bbb-3(b)(1), unless the authorization is terminated or revoked sooner.    Influenza A by PCR NEGATIVE NEGATIVE Final   Influenza B by PCR NEGATIVE NEGATIVE Final    Comment: (NOTE) The Xpert Xpress SARS-CoV-2/FLU/RSV assay is intended as an aid in  the diagnosis of influenza from Nasopharyngeal swab specimens and  should not be used as a sole basis for treatment. Nasal washings and  aspirates are unacceptable for Xpert Xpress SARS-CoV-2/FLU/RSV  testing. Fact Sheet for Patients: PinkCheek.be Fact Sheet for Healthcare Providers: GravelBags.it This test is not yet approved or cleared by the [place] FDA and  has been authorized for detection and/or diagnosis of SARS-CoV-2 by  FDA under an Emergency Use Authorization (EUA). This EUA will remain  in effect (meaning this test can be used) for the duration of the  Covid-19 declaration under Section 564(b)(1) of the Act, 21  U.S.C. section 360bbb-3(b)(1), unless the authorization is  terminated or revoked. Performed at [facility], [place] [address]  [place], [place] [address]   Culture, blood (routine x 2)     Status: None (Preliminary result)   Collection Time: 06/09/19 12:14 AM   Specimen: BLOOD  Result Value Ref Range Status   Specimen Description BLOOD SITE NOT SPECIFIED  Final   Special Requests   Final    BOTTLES DRAWN AEROBIC AND ANAEROBIC Blood Culture results may not be optimal due to an inadequate volume of blood received in culture bottles   Culture   Final    NO GROWTH 1 DAY Performed at [facility]  [facility], [place] [address], [place], [place] [address]    Report Status PENDING  Incomplete         Radiology Studies: DG Chest Port 1 View  Result Date: 06/08/2019 CLINICAL DATA:  Shortness of breath EXAM: PORTABLE CHEST 1 VIEW COMPARISON:  Portable exam 1759 hours compared to 06/02/2019 FINDINGS: Rotated to the RIGHT. Normal heart size, mediastinal contours, and pulmonary vascularity. Chronic accentuation of basilar interstitial markings, stable. No definite acute infiltrate, pleural effusion or pneumothorax. Osseous structures unremarkable. IMPRESSION: No acute abnormalities. Electronically Signed   By: Lavonia Dana M.D.   On: 06/08/2019 18:19        Scheduled Meds: . acetaminophen  1,000 mg Oral Once  . albuterol  2.5 mg Nebulization TID  . enoxaparin (LOVENOX) injection  130 mg Subcutaneous Q12H  . feeding supplement (PRO-STAT SUGAR FREE 64)  30 mL Per Tube TID  . free water  100 mL Per Tube Q6H  . gabapentin  600 mg Per Tube QID  . metoprolol tartrate  12.5 mg Per Tube BID  . pantoprazole sodium  40 mg Per Tube Daily  . sertraline  50 mg Per Tube Daily   Continuous Infusions: . ampicillin-sulbactam (UNASYN) IV 3 g (06/10/19 0553)  . feeding supplement (JEVITY 1.2 CAL) 1,000 mL (06/09/19 1954)     LOS: 0 days    Time spent: 45 minutes  Donnamae Jude, MD [facility] Pager [phone]  If 7PM-7AM, please contact night-coverage [email or web address] Password [facility] 06/10/2019, 12:47 PM

## 2019-06-10 NOTE — Progress Notes (Signed)
Responded to call from patient's nurse.  Arrived to find [name] unable to apeak; he was very emotional (crying).  Wrote in his notebook he was so sad and all he really wanted was to be loved again.  Sat beside his bed while he continually suctioned out his throat.  Humme dsoftly to him, sang to him, laid my hands on his heart and his brow.  He calmed down. Assured him [facility] loves him and always has. And assured him I love him.  Prayed for Mr.  Kyle [name] at his bedside.  Departed after 20 minutes. [name] wrote he would like Chaplains to continue to visit him. Will pass [name] off to night Chaplain.  De Burrs Chaplain Resident

## 2019-06-10 NOTE — TOC Progression Note (Addendum)
Transition of Care [facility]) - Progression Note    Patient Details  Name: Kyle [name] MRN: [id number] Date of Birth: 1956/05/10  Transition of Care [facility]) CM/SW Contact  Carles Collet, RN Phone Number: 06/10/2019, 9:38 AM  Clinical Narrative:     09:00 Notified by [facility] that they cannot accept patient due to reports of no power source in the home and no running water in the home. Verified w [facility] that they are still willing to accept and provide tube feeding and supplies if a safe environment is established (ie he has running water and electricity). [facility] liaison is able to visit at bedside on Monday 12/28 for tube feed instruction.  Spoke w patient who states he has running water to a faucet via a well source, and that he has electricity. He provided me with his landord's name and phone to verify. I left a  hippa compliant voicemail with [name] at [phone]. Awaiting callback. Will need confirmation on utilities to re-present to [facility] or refer to another [facility] source.   I have discussed case w Lurline Idol supervisor on call. I sent secure message to attending (MD w last note) explaining possible delay in DC, and instructed nurse, Amy, not to discharge him at this time.    16:50 Attempted to call landord from my number and "63"  number and it went straight to VM. Hippa compliant message left. Second message today.     Expected Discharge Plan: [place]   Expected Discharge Plan and Services Expected Discharge Plan: [place] In-house Referral: Clinical Social Work Discharge Planning Services: CM Consult Post Acute Care Choice: [place] arrangements for the past 2 months: [place] Expected Discharge Date: 06/09/19               DME Arranged: N/A         HH Arranged: OT, PT, RN [place] Agency: [place] Date [facility] Agency Contacted: 06/09/19 Time [place]: [place] Representative  spoke with at [place]: [place] ([place]) Interventions    Readmission Risk Interventions No flowsheet data found.

## 2019-06-11 ENCOUNTER — Observation Stay (HOSPITAL_COMMUNITY): Payer: Medicare PPO

## 2019-06-11 DIAGNOSIS — T17908A Unspecified foreign body in respiratory tract, part unspecified causing other injury, initial encounter: Secondary | ICD-10-CM | POA: Diagnosis not present

## 2019-06-11 DIAGNOSIS — I69391 Dysphagia following cerebral infarction: Secondary | ICD-10-CM

## 2019-06-11 DIAGNOSIS — J690 Pneumonitis due to inhalation of food and vomit: Secondary | ICD-10-CM | POA: Diagnosis not present

## 2019-06-11 DIAGNOSIS — G894 Chronic pain syndrome: Secondary | ICD-10-CM

## 2019-06-11 DIAGNOSIS — I482 Chronic atrial fibrillation, unspecified: Secondary | ICD-10-CM | POA: Diagnosis not present

## 2019-06-11 LAB — CBC
HCT: 28.9 % — ABNORMAL LOW (ref 39.0–52.0)
Hemoglobin: 8.8 g/dL — ABNORMAL LOW (ref 13.0–17.0)
MCH: 29.3 pg (ref 26.0–34.0)
MCHC: 30.4 g/dL (ref 30.0–36.0)
MCV: 96.3 fL (ref 80.0–100.0)
Platelets: 239 10*3/uL (ref 150–400)
RBC: 3 MIL/uL — ABNORMAL LOW (ref 4.22–5.81)
RDW: 20.3 % — ABNORMAL HIGH (ref 11.5–15.5)
WBC: 17.7 10*3/uL — ABNORMAL HIGH (ref 4.0–10.5)
nRBC: 0.2 % (ref 0.0–0.2)

## 2019-06-11 LAB — BASIC METABOLIC PANEL
Anion gap: 8 (ref 5–15)
BUN: 26 mg/dL — ABNORMAL HIGH (ref 8–23)
CO2: 28 mmol/L (ref 22–32)
Calcium: 8.1 mg/dL — ABNORMAL LOW (ref 8.9–10.3)
Chloride: 109 mmol/L (ref 98–111)
Creatinine, Ser: 0.83 mg/dL (ref 0.61–1.24)
GFR calc Af Amer: >60 mL/min (ref >60)
GFR calc non Af Amer: >60 mL/min (ref >60)
Glucose, Bld: 135 mg/dL — ABNORMAL HIGH (ref 70–99)
Potassium: 3.2 mmol/L — ABNORMAL LOW (ref 3.5–5.1)
Sodium: 145 mmol/L (ref 135–145)

## 2019-06-11 LAB — GLUCOSE, CAPILLARY
Glucose-Capillary: 105 mg/dL — ABNORMAL HIGH (ref 70–99)
Glucose-Capillary: 148 mg/dL — ABNORMAL HIGH (ref 70–99)

## 2019-06-11 IMAGING — CR DG CHEST 2V
2 series · 2 of 2 positions shown · non-contrast
Comparison: Portable exam [GQ] hours compared to [DATE]

CLINICAL DATA: Aspiration

EXAM:
CHEST - 2 VIEW

[chest lat]
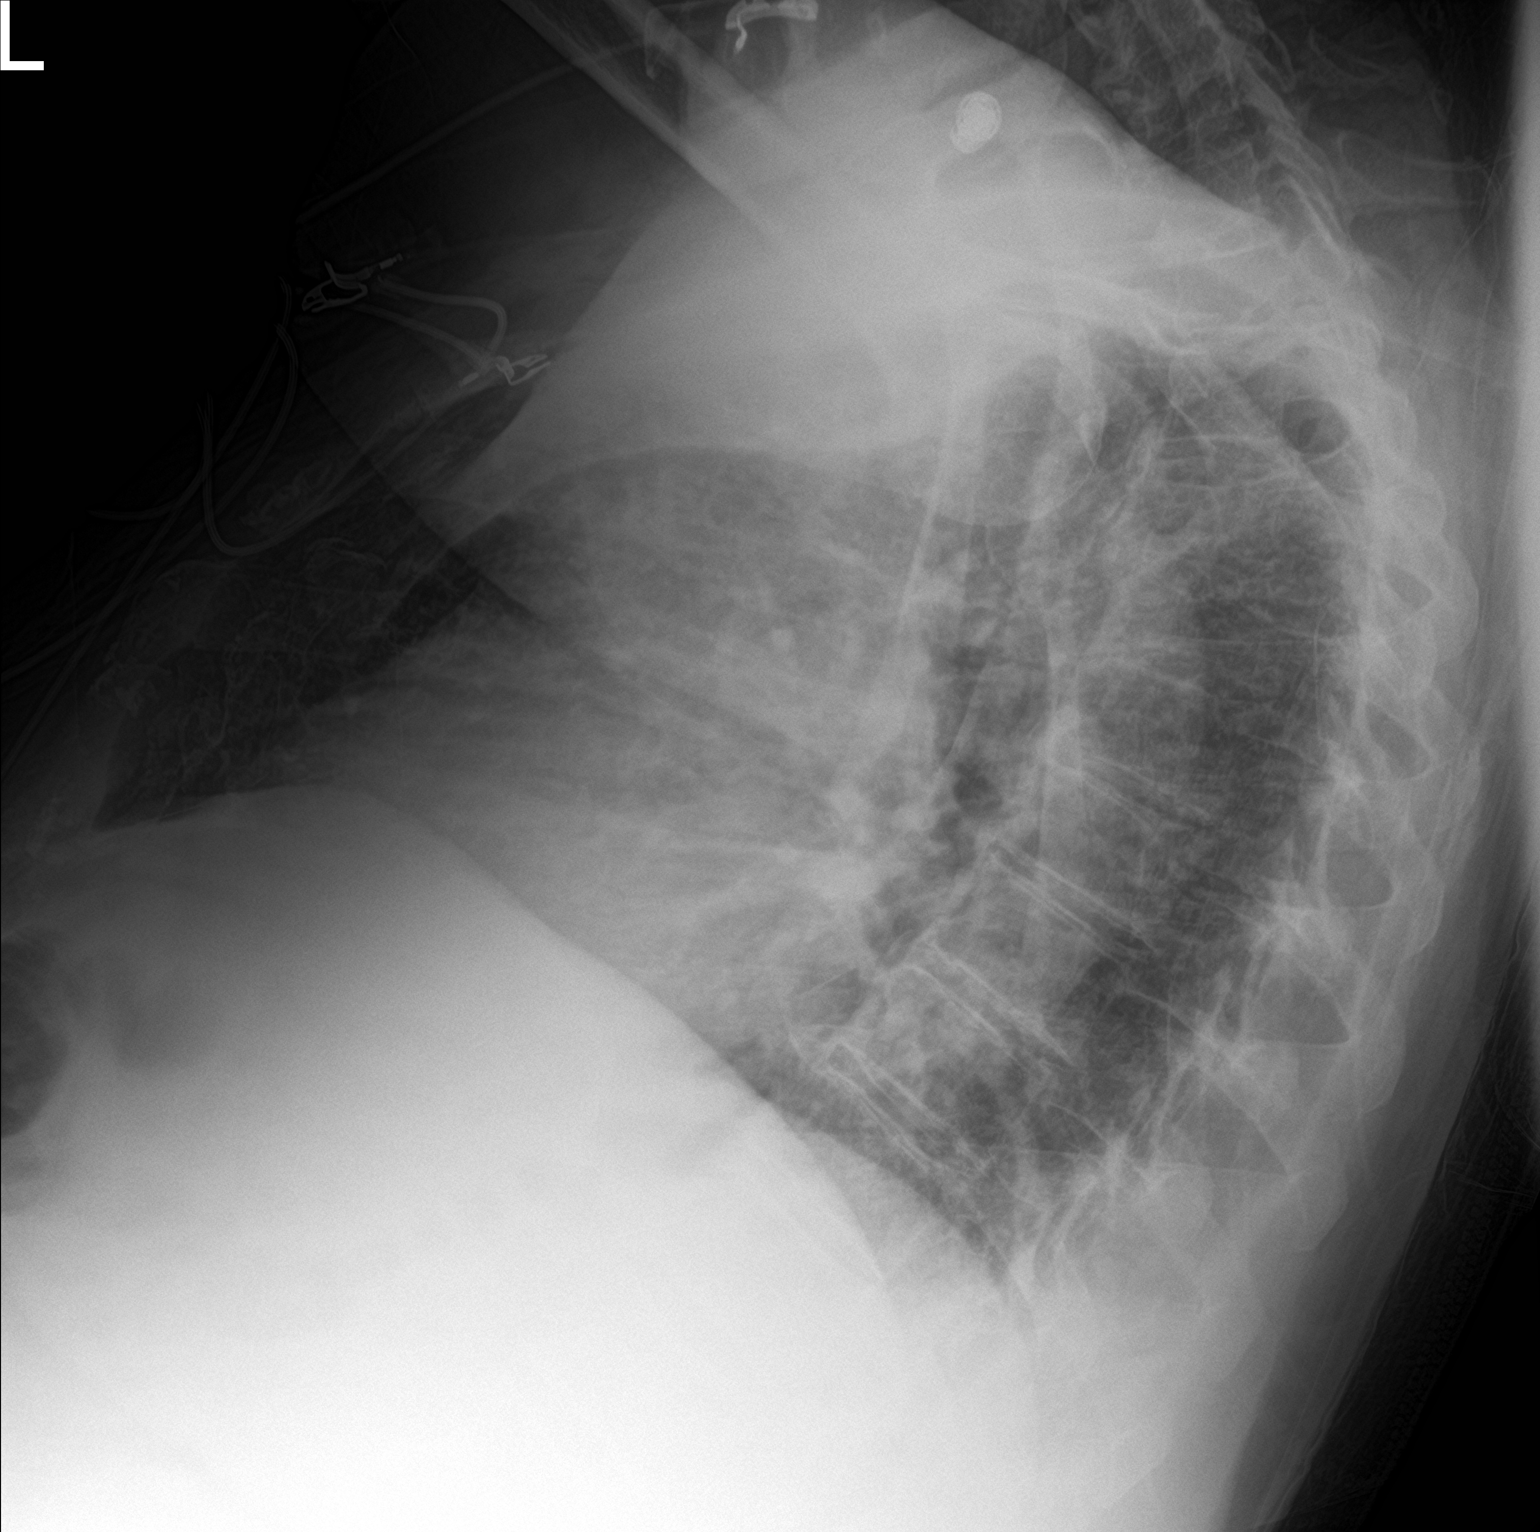

[chest ap]
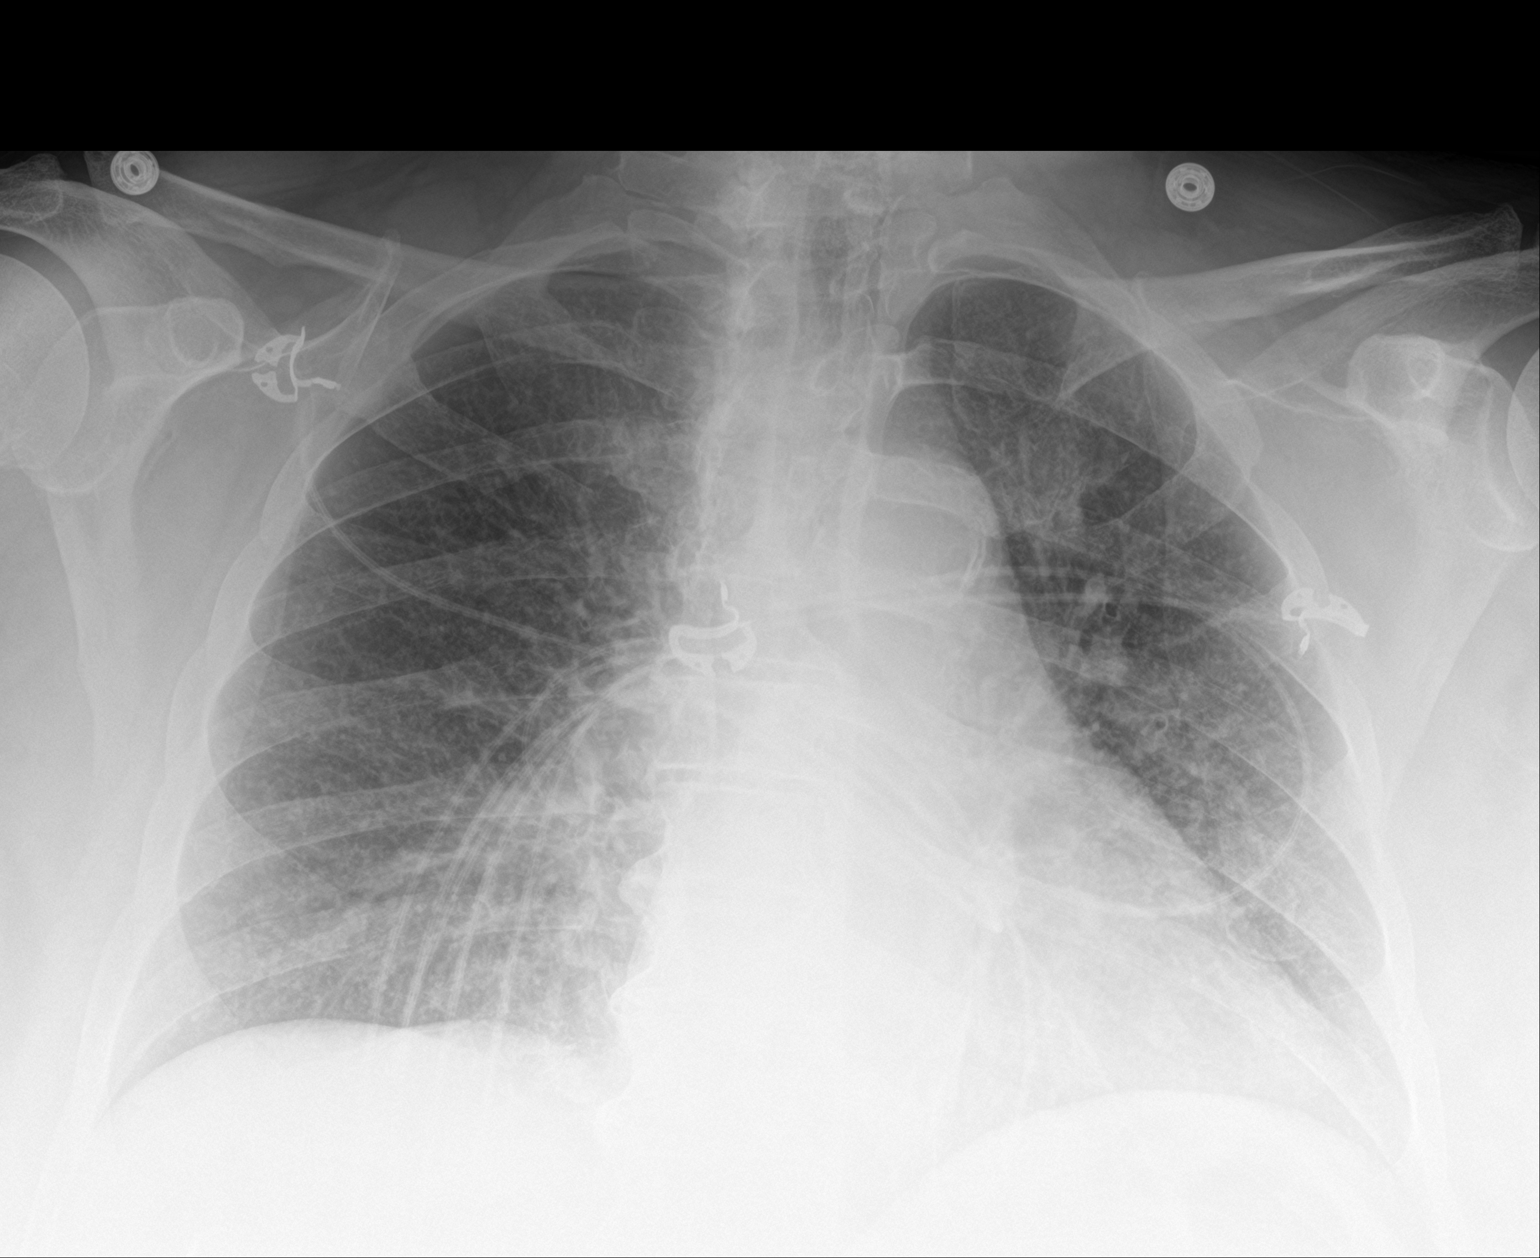

[2 of 2 positions shown; findings below may reference images not displayed]

FINDINGS: Normal heart size, mediastinal contours, and pulmonary vascularity.

Atherosclerotic calcification aorta.

Peribronchial thickening with slight chronic accentuation of
perihilar markings.

Medial RIGHT lung base opacity which could represent pneumonia or
aspiration.

No pleural effusion or pneumothorax.

Scattered endplate spur formation thoracic spine.
IMPRESSION: Bronchitic changes with medial RIGHT lung base opacity question
pneumonia versus aspiration.

## 2019-06-11 MED ORDER — DICLOFENAC SODIUM 1 % EX GEL
2.0000 g | Freq: Four times a day (QID) | CUTANEOUS | Status: DC
  Administered 2019-06-11 – 2019-06-23 (×49): 2 g via TOPICAL
  Filled 2019-06-11 (×2): qty 100

## 2019-06-11 MED ORDER — FREE WATER
200.0000 mL | Freq: Four times a day (QID) | Status: DC
  Administered 2019-06-11 – 2019-06-14 (×12): 200 mL

## 2019-06-11 MED ORDER — SODIUM CHLORIDE 0.9 % IV SOLN
510.0000 mg | Freq: Once | INTRAVENOUS | Status: AC
  Administered 2019-06-11: 510 mg via INTRAVENOUS
  Filled 2019-06-11: qty 17

## 2019-06-11 NOTE — Progress Notes (Signed)
PROGRESS NOTE    Kyle [name]  [id number] DOB: February 23, 1956 DOA: 06/08/2019 PCP: Kyle Jewel, MD    Brief Narrative:   Pt with history of atrial fibrillation, hypertension, COPD was recently admitted for acute CVA underwent mechanical thrombectomy for right MCA infarct with patient having dysphagia and difficulty speaking had to undergo PEG tube placement and was discharged to rehab when patient signed out himself [place] and went home and try to eat when he aspirated and became short of breath and EMS was called.  Patient was brought to the ER.  Denies any chest pain shortness of breath abdominal pain or diarrhea. Attempted d/c yesterday and then there were questions about tube feeds and meds. Meds given, but HH not set up.  Need safe d/c plan   Assessment & Plan:   Principal Problem:   Aspiration into airway Active Problems:   Adjustment disorder with mixed anxiety and depressed mood   Essential hypertension   Chronic atrial fibrillation (HCC)   Dysphagia due to recent cerebrovascular accident   Aspiration pneumonia (HCC)    Acute respiratory failure with hypoxia secondary to aspiration into the airway with history of dysphagia from recent stroke on PEG tube placement along with non-compliance  -patient had aspiration into the airway after patient attempted to eat. - Unasyn -x ray with infiltrate on right lower  Persistent A. fib  - metoprolol. -was d/c'd on lovenox BID to SNF but left AMA  Recent stroke -Status post thrombectomy during recent admission for stroke. Still with communication issues  Anemia appears to be new- Fe def -iron low -will dose IV fe  Right knee pain -x ray w/o fracture -voltaren gel  Dysphagia on PEG tube diet. - Continue and try to arrange home health  -attempted to call landlord, call goes straight to voice mail  Chronic pain  -continue home medications for pain.   DVT prophylaxis: Lovenox Code Status:  Full Family Communication: patient Disposition Plan: needs safe d/c plan   Consultants:   Palliative care      Antimicrobials: Anti-infectives (From admission, onward)   Start     Dose/Rate Route Frequency Ordered Stop   06/09/19 1645  levofloxacin (LEVAQUIN) tablet 500 mg  Status:  Discontinued     500 mg Per J Tube Daily 06/09/19 1640 06/09/19 1641   06/09/19 1645  levofloxacin (LEVAQUIN) tablet 500 mg  Status:  Discontinued     500 mg Per J Tube Daily 06/09/19 1642 06/09/19 1934   06/09/19 0000  Ampicillin-Sulbactam (UNASYN) 3 g in sodium chloride 0.9 % 100 mL IVPB     3 g 200 mL/hr over 30 Minutes Intravenous Every 6 hours 06/08/19 2340     06/09/19 0000  levofloxacin (LEVAQUIN) 500 MG tablet  Status:  Discontinued     500 mg Per J Tube Daily 06/09/19 1442 06/09/19    06/09/19 0000  levofloxacin (LEVAQUIN) 500 MG tablet     500 mg Per J Tube Daily 06/09/19 1621 06/14/19 2359   06/08/19 1800  Ampicillin-Sulbactam (UNASYN) 3 g in sodium chloride 0.9 % 100 mL IVPB     3 g 200 mL/hr over 30 Minutes Intravenous  Once 06/08/19 1754 06/08/19 1856        Subjective: C/o right knee pain  Objective: Vitals:   06/10/19 1623 06/10/19 1946 06/10/19 2237 06/11/19 0700  BP: (!) 133/56  (!) 110/59   Pulse: 92  82 79  Resp: 16   18  Temp: 98.7 F (37.1 C)  99.6 F (37.6 C)   TempSrc: Oral  Oral   SpO2: 95% 97% 100% 100%  Weight:      Height:        Intake/Output Summary (Last 24 hours) at 06/11/2019 1418 Last data filed at 06/11/2019 1117 Gross per 24 hour  Intake 3589 ml  Output 600 ml  Net 2989 ml   Filed Weights   06/08/19 2335  Weight: (!) 137.4 kg    Examination: Coughing on his own secretions Chronically ill appearing Obese male In bed Bruising on right knee- painful to touch +Bs, soft, NT   Data Reviewed: I have personally reviewed following labs and imaging studies  CBC: Recent Labs  Lab 06/08/19 1855 06/09/19 0014 06/10/19 0817  06/11/19 0923  WBC 18.4* 16.5* 16.0* 17.7*  NEUTROABS 14.8*  --   --   --   HGB 10.7* 10.8* 9.2* 8.8*  HCT 36.0* 35.4* 30.2* 28.9*  MCV 96.8 93.4 94.1 96.3  PLT 315 279 253 [id number]   Basic Metabolic Panel: Recent Labs  Lab 06/08/19 1855 06/09/19 0014 06/11/19 0923  NA 146* 149* 145  K 4.3 3.7 3.2*  CL 108 109 109  CO2 26 26 28   GLUCOSE 108* 105* 135*  BUN 50* 46* 26*  CREATININE 1.12 1.04 0.83  CALCIUM 9.2 9.2 8.1*   GFR: Estimated Creatinine Clearance: 139.7 mL/min (by C-G formula based on SCr of 0.83 mg/dL). CBG: Recent Labs  Lab 06/09/19 0752 06/10/19 0754 06/10/19 1620 06/10/19 2349 06/11/19 0859  GLUCAP 121* 79 130* 127* 148*   Anemia Panel: Recent Labs    06/09/19 0702  VITAMINB12 393  FOLATE 12.6  FERRITIN 84  TIBC 231*  IRON 29*  RETICCTPCT 4.2*   Urine analysis:    Component Value Date/Time   COLORURINE YELLOW 06/08/2019 2113   APPEARANCEUR CLEAR 06/08/2019 2113   LABSPEC 1.026 06/08/2019 2113   PHURINE 5.0 06/08/2019 2113   GLUCOSEU NEGATIVE 06/08/2019 2113   HGBUR NEGATIVE 06/08/2019 2113   [place] NEGATIVE 06/08/2019 2113   [place] NEGATIVE 06/08/2019 2113   PROTEINUR 30 (A) 06/08/2019 2113   NITRITE NEGATIVE 06/08/2019 2113   LEUKOCYTESUR NEGATIVE 06/08/2019 2113   Sepsis Labs:  Recent Results (from the past 240 hour(s))  Culture, blood (routine x 2)     Status: None (Preliminary result)   Collection Time: 06/08/19  6:55 PM   Specimen: BLOOD  Result Value Ref Range Status   Specimen Description BLOOD LEFT ANTECUBITAL  Final   Special Requests   Final    BOTTLES DRAWN AEROBIC AND ANAEROBIC Blood Culture results may not be optimal due to an inadequate volume of blood received in culture bottles   Culture   Final    NO GROWTH 3 DAYS Performed at [facility], [place] [address], [place], [place] [address]    Report Status PENDING  Incomplete  Respiratory Panel by RT PCR (Flu A&B, Covid) - Nasopharyngeal Swab     Status: None    Collection Time: 06/08/19  7:00 PM   Specimen: Nasopharyngeal Swab  Result Value Ref Range Status   SARS Coronavirus 2 by RT PCR NEGATIVE NEGATIVE Final    Comment: (NOTE) SARS-CoV-2 target nucleic acids are NOT DETECTED. The SARS-CoV-2 RNA is generally detectable in upper respiratoy specimens during the acute phase of infection. The lowest concentration of SARS-CoV-2 viral copies this assay can detect is 131 copies/mL. A negative result does not preclude SARS-Cov-2 infection and should not be used as the sole basis for  treatment or other patient management decisions. A negative result may occur with  improper specimen collection/handling, submission of specimen other than nasopharyngeal swab, presence of viral mutation(s) within the areas targeted by this assay, and inadequate number of viral copies (<131 copies/mL). A negative result must be combined with clinical observations, patient history, and epidemiological information. The expected result is Negative. Fact Sheet for Patients:  PinkCheek.be Fact Sheet for Healthcare Providers:  GravelBags.it This test is not yet ap proved or cleared by the [place] FDA and  has been authorized for detection and/or diagnosis of SARS-CoV-2 by FDA under an Emergency Use Authorization (EUA). This EUA will remain  in effect (meaning this test can be used) for the duration of the COVID-19 declaration under Section 564(b)(1) of the Act, 21 U.S.C. section 360bbb-3(b)(1), unless the authorization is terminated or revoked sooner.    Influenza A by PCR NEGATIVE NEGATIVE Final   Influenza B by PCR NEGATIVE NEGATIVE Final    Comment: (NOTE) The Xpert Xpress SARS-CoV-2/FLU/RSV assay is intended as an aid in  the diagnosis of influenza from Nasopharyngeal swab specimens and  should not be used as a sole basis for treatment. Nasal washings and  aspirates are unacceptable for Xpert Xpress  SARS-CoV-2/FLU/RSV  testing. Fact Sheet for Patients: PinkCheek.be Fact Sheet for Healthcare Providers: GravelBags.it This test is not yet approved or cleared by the [place] FDA and  has been authorized for detection and/or diagnosis of SARS-CoV-2 by  FDA under an Emergency Use Authorization (EUA). This EUA will remain  in effect (meaning this test can be used) for the duration of the  Covid-19 declaration under Section 564(b)(1) of the Act, 21  U.S.C. section 360bbb-3(b)(1), unless the authorization is  terminated or revoked. Performed at [facility], [place] [address], [place], [place] [address]   Culture, blood (routine x 2)     Status: None (Preliminary result)   Collection Time: 06/09/19 12:14 AM   Specimen: BLOOD  Result Value Ref Range Status   Specimen Description BLOOD SITE NOT SPECIFIED  Final   Special Requests   Final    BOTTLES DRAWN AEROBIC AND ANAEROBIC Blood Culture results may not be optimal due to an inadequate volume of blood received in culture bottles   Culture   Final    NO GROWTH 2 DAYS Performed at [facility], [place] [address] [place], [place] [address]    Report Status PENDING  Incomplete         Radiology Studies: DG Chest 2 View  Result Date: 06/11/2019 CLINICAL DATA:  Aspiration EXAM: CHEST - 2 VIEW COMPARISON:  Portable exam 1107 hours compared to 06/08/2019 FINDINGS: Normal heart size, mediastinal contours, and pulmonary vascularity. Atherosclerotic calcification aorta. Peribronchial thickening with slight chronic accentuation of perihilar markings. Medial RIGHT lung base opacity which could represent pneumonia or aspiration. No pleural effusion or pneumothorax. Scattered endplate spur formation thoracic spine. IMPRESSION: Bronchitic changes with medial RIGHT lung base opacity question pneumonia versus aspiration. Electronically Signed   By: Lavonia Dana M.D.   On: 06/11/2019  11:39   DG Knee Complete 4 Views Right  Result Date: 06/10/2019 CLINICAL DATA:  Right knee pain and bruising. EXAM: RIGHT KNEE - COMPLETE 4+ VIEW COMPARISON:  Right femur radiographs 06/02/2019. Knee radiographs 04/21/2019. FINDINGS: No evidence of acute fracture or dislocation. Relative to the distal femur, there is mild lateral subluxation of the tibia, similar to prior radiographs. Moderate tricompartmental degenerative changes are present with joint space narrowing and  osteophytes. No significant joint effusion. Soft tissue calcifications are present medially in the proximal lower leg. IMPRESSION: No acute osseous findings. Tricompartmental degenerative changes with mild lateral subluxation of the tibia, similar to prior radiographs. Electronically Signed   By: Richardean Sale M.D.   On: 06/10/2019 14:12        Scheduled Meds: . acetaminophen  1,000 mg Oral Once  . albuterol  2.5 mg Nebulization TID  . enoxaparin (LOVENOX) injection  130 mg Subcutaneous Q12H  . feeding supplement (PRO-STAT SUGAR FREE 64)  30 mL Per Tube TID  . free water  200 mL Per Tube Q6H  . gabapentin  600 mg Per Tube QID  . metoprolol tartrate  12.5 mg Per Tube BID  . pantoprazole sodium  40 mg Per Tube Daily  . sertraline  50 mg Per Tube Daily   Continuous Infusions: . ampicillin-sulbactam (UNASYN) IV 3 g (06/11/19 1240)  . feeding supplement (JEVITY 1.2 CAL) 1,000 mL (06/11/19 0533)     LOS: 0 days    Time spent: 35 minutes  Geradine Girt, DO [facility]   If 7PM-7AM, please contact night-coverage [email or web address] Password [email or web address] 06/11/2019, 2:18 PM

## 2019-06-11 NOTE — Progress Notes (Signed)
Pharmacy Antibiotic Note  Kyle [name] is a 63 y.o. male admitted on 06/08/2019 with concern for aspiration pneumonia.  Pharmacy has been consulted for Unasyn dosing.   Patient is afebrile, WBC slightly elevated, SCr stable  Plan: Continue Unasyn 3g IV Q6H Monitor clinical picture, renal function, F/U C&S, change to oral antibiotics, LOT   Height: 6\' 3"  (190.5 cm) Weight: (!) 302 lb 14.6 oz (137.4 kg) IBW/kg (Calculated) : 84.5  Temp (24hrs), Avg:99.2 F (37.3 C), Min:98.7 F (37.1 C), Max:99.6 F (37.6 C)  Recent Labs  Lab 06/08/19 1855 06/08/19 1954 06/09/19 0014 06/10/19 0817 06/11/19 0923  WBC 18.4*  --  16.5* 16.0* 17.7*  CREATININE 1.12  --  1.04  --  0.83  LATICACIDVEN  --  1.7  --   --   --     Estimated Creatinine Clearance: 139.7 mL/min (by C-G formula based on SCr of 0.83 mg/dL).    Allergies  Allergen Reactions  . Vioxx [Rofecoxib] Rash    Antimicrobials this admission: Unasyn 12/24 >>  12/24 COVID negative 12/24 Influenza PCR negative 12/24 BCx: ngtd 12/25 BCx: ngtd   Brendolyn Patty, PharmD PGY2 Pharmacy Resident Phone [phone]  06/11/2019   11:00 AM

## 2019-06-12 ENCOUNTER — Inpatient Hospital Stay (HOSPITAL_COMMUNITY): Payer: Medicare PPO

## 2019-06-12 DIAGNOSIS — K219 Gastro-esophageal reflux disease without esophagitis: Secondary | ICD-10-CM | POA: Diagnosis present

## 2019-06-12 DIAGNOSIS — I739 Peripheral vascular disease, unspecified: Secondary | ICD-10-CM | POA: Diagnosis present

## 2019-06-12 DIAGNOSIS — Z9884 Bariatric surgery status: Secondary | ICD-10-CM | POA: Diagnosis not present

## 2019-06-12 DIAGNOSIS — Z8249 Family history of ischemic heart disease and other diseases of the circulatory system: Secondary | ICD-10-CM | POA: Diagnosis not present

## 2019-06-12 DIAGNOSIS — S7011XA Contusion of right thigh, initial encounter: Secondary | ICD-10-CM | POA: Diagnosis not present

## 2019-06-12 DIAGNOSIS — S7011XD Contusion of right thigh, subsequent encounter: Secondary | ICD-10-CM | POA: Diagnosis not present

## 2019-06-12 DIAGNOSIS — T17908A Unspecified foreign body in respiratory tract, part unspecified causing other injury, initial encounter: Secondary | ICD-10-CM | POA: Diagnosis not present

## 2019-06-12 DIAGNOSIS — I69391 Dysphagia following cerebral infarction: Secondary | ICD-10-CM | POA: Diagnosis not present

## 2019-06-12 DIAGNOSIS — F4323 Adjustment disorder with mixed anxiety and depressed mood: Secondary | ICD-10-CM

## 2019-06-12 DIAGNOSIS — E870 Hyperosmolality and hypernatremia: Secondary | ICD-10-CM | POA: Diagnosis not present

## 2019-06-12 DIAGNOSIS — Z66 Do not resuscitate: Secondary | ICD-10-CM | POA: Diagnosis not present

## 2019-06-12 DIAGNOSIS — J449 Chronic obstructive pulmonary disease, unspecified: Secondary | ICD-10-CM | POA: Diagnosis present

## 2019-06-12 DIAGNOSIS — G4733 Obstructive sleep apnea (adult) (pediatric): Secondary | ICD-10-CM | POA: Diagnosis present

## 2019-06-12 DIAGNOSIS — I495 Sick sinus syndrome: Secondary | ICD-10-CM | POA: Diagnosis present

## 2019-06-12 DIAGNOSIS — Z8261 Family history of arthritis: Secondary | ICD-10-CM | POA: Diagnosis not present

## 2019-06-12 DIAGNOSIS — J690 Pneumonitis due to inhalation of food and vomit: Secondary | ICD-10-CM | POA: Diagnosis present

## 2019-06-12 DIAGNOSIS — J9601 Acute respiratory failure with hypoxia: Secondary | ICD-10-CM | POA: Diagnosis present

## 2019-06-12 DIAGNOSIS — I4819 Other persistent atrial fibrillation: Secondary | ICD-10-CM | POA: Diagnosis present

## 2019-06-12 DIAGNOSIS — R0602 Shortness of breath: Secondary | ICD-10-CM | POA: Diagnosis present

## 2019-06-12 DIAGNOSIS — I10 Essential (primary) hypertension: Secondary | ICD-10-CM | POA: Diagnosis present

## 2019-06-12 DIAGNOSIS — E785 Hyperlipidemia, unspecified: Secondary | ICD-10-CM | POA: Diagnosis present

## 2019-06-12 DIAGNOSIS — Z808 Family history of malignant neoplasm of other organs or systems: Secondary | ICD-10-CM | POA: Diagnosis not present

## 2019-06-12 DIAGNOSIS — I639 Cerebral infarction, unspecified: Secondary | ICD-10-CM | POA: Diagnosis not present

## 2019-06-12 DIAGNOSIS — Z6841 Body Mass Index (BMI) 40.0 and over, adult: Secondary | ICD-10-CM | POA: Diagnosis not present

## 2019-06-12 DIAGNOSIS — M549 Dorsalgia, unspecified: Secondary | ICD-10-CM | POA: Diagnosis present

## 2019-06-12 DIAGNOSIS — G894 Chronic pain syndrome: Secondary | ICD-10-CM | POA: Diagnosis present

## 2019-06-12 DIAGNOSIS — Z931 Gastrostomy status: Secondary | ICD-10-CM | POA: Diagnosis not present

## 2019-06-12 DIAGNOSIS — E871 Hypo-osmolality and hyponatremia: Secondary | ICD-10-CM | POA: Diagnosis present

## 2019-06-12 DIAGNOSIS — I482 Chronic atrial fibrillation, unspecified: Secondary | ICD-10-CM | POA: Diagnosis not present

## 2019-06-12 DIAGNOSIS — S7011XS Contusion of right thigh, sequela: Secondary | ICD-10-CM | POA: Diagnosis not present

## 2019-06-12 DIAGNOSIS — T17908D Unspecified foreign body in respiratory tract, part unspecified causing other injury, subsequent encounter: Secondary | ICD-10-CM | POA: Diagnosis not present

## 2019-06-12 DIAGNOSIS — M1990 Unspecified osteoarthritis, unspecified site: Secondary | ICD-10-CM | POA: Diagnosis present

## 2019-06-12 DIAGNOSIS — D649 Anemia, unspecified: Secondary | ICD-10-CM

## 2019-06-12 DIAGNOSIS — Z20822 Contact with and (suspected) exposure to covid-19: Secondary | ICD-10-CM | POA: Diagnosis present

## 2019-06-12 LAB — GLUCOSE, CAPILLARY
Glucose-Capillary: 114 mg/dL — ABNORMAL HIGH (ref 70–99)
Glucose-Capillary: 114 mg/dL — ABNORMAL HIGH (ref 70–99)

## 2019-06-12 LAB — TROPONIN I (HIGH SENSITIVITY): Troponin I (High Sensitivity): 10 ng/L (ref <18)

## 2019-06-12 IMAGING — MR MR FEMUR*R* W/O CM
2 series · 24 of 40 positions shown · non-contrast
Comparison: Plain films of the right upper leg [DATE].

CLINICAL DATA: Right upper leg swelling in a patient who is on
Lovenox. Decreased hemoglobin. The patient suffered a fall
approximately 3 weeks ago.

EXAM:
MRI OF THE RIGHT FEMUR WITHOUT CONTRAST
TECHNIQUE: Multiplanar, multisequence MR imaging of the right femur was
performed. No intravenous contrast was administered.

[Series 5: composed cor t1_comp_filt · coronal · right · 6.0mm · 1.25mm/px · 15 of 38 slices shown]
[im 1/38]
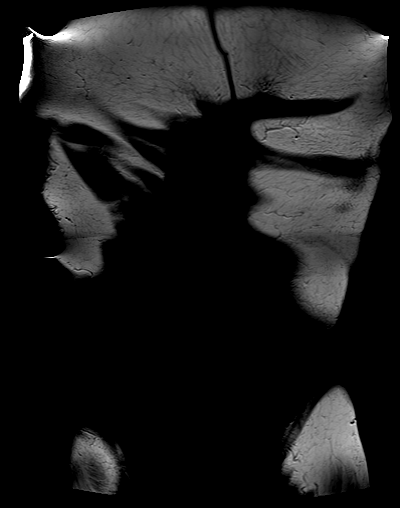
[im 2/38]
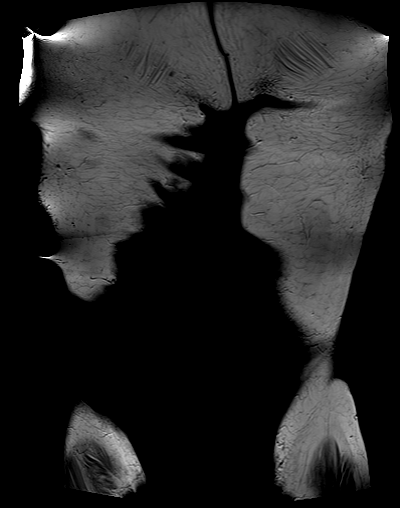
[im 4/38]
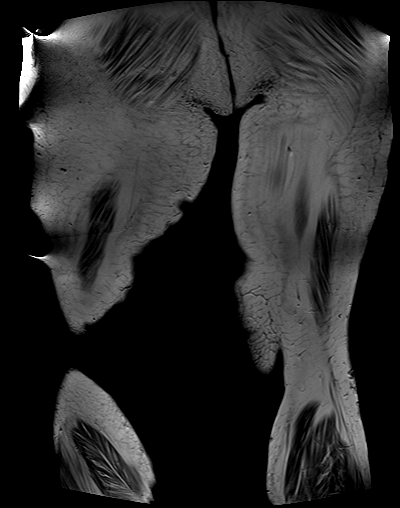
[im 6/38]
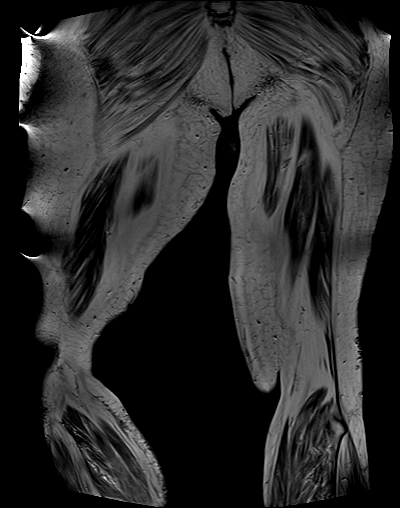
[im 8/38]
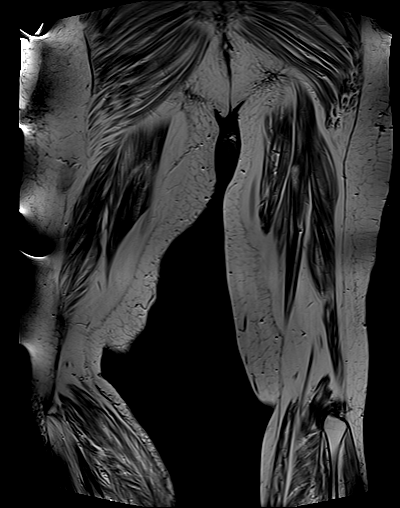
[im 10/38]
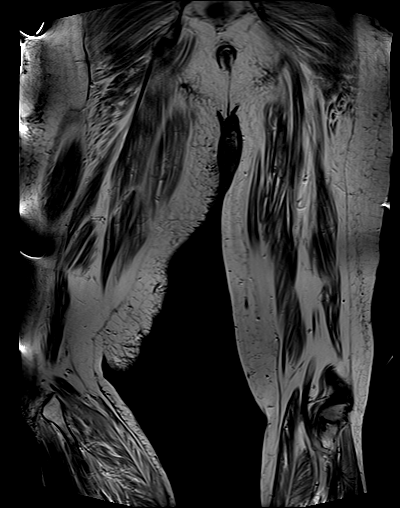
[im 12/38]
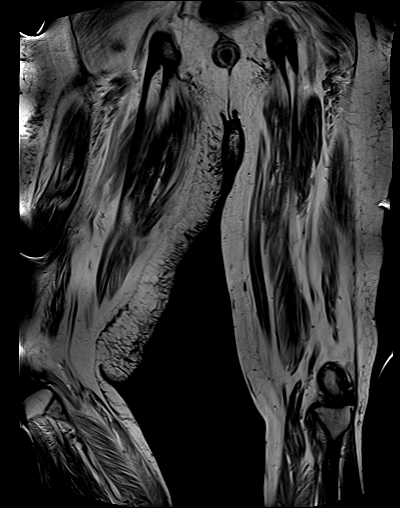
[im 14/38]
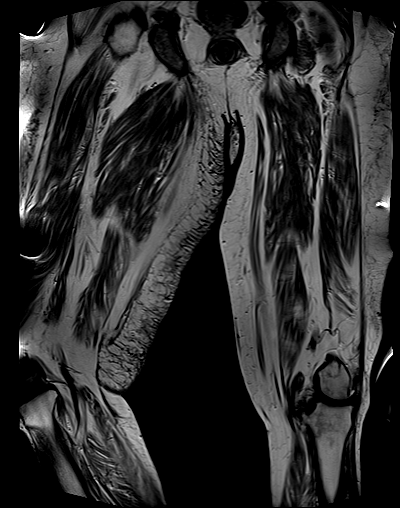
[im 16/38]
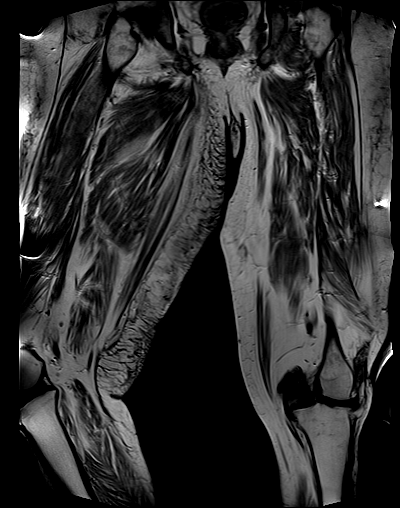
[im 18/38]
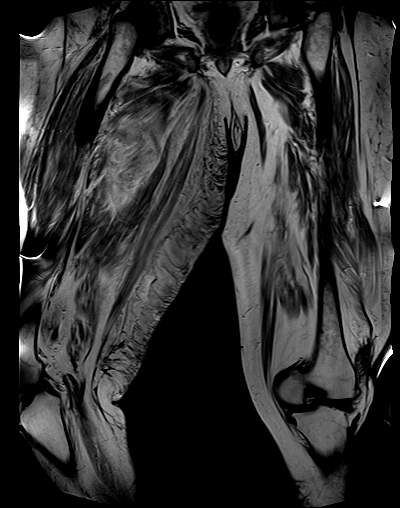
[im 20/38]
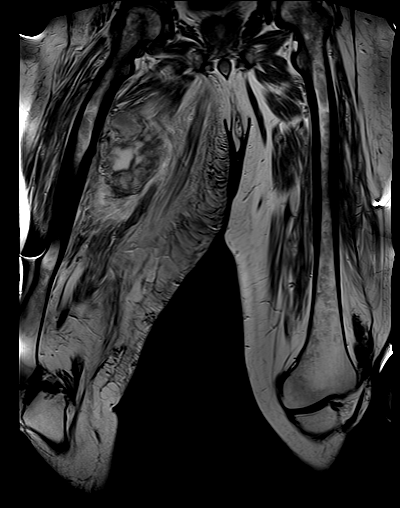
[im 22/38]
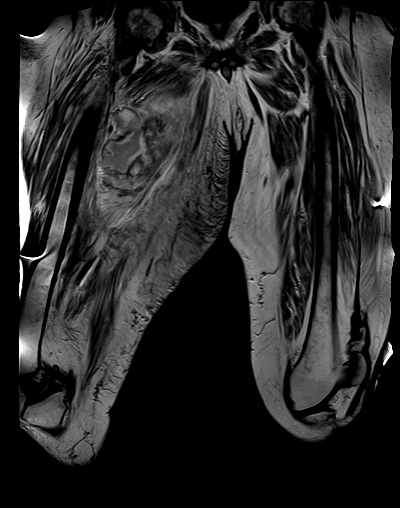
[im 26/38]
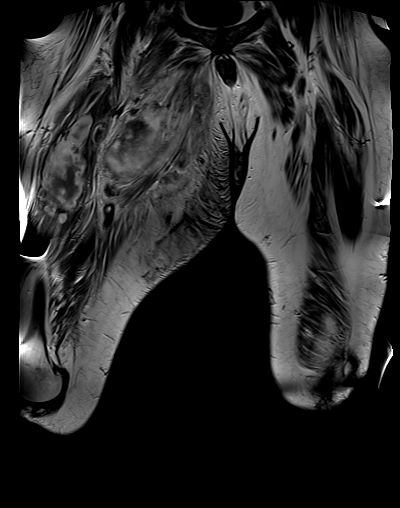
[im 32/38]
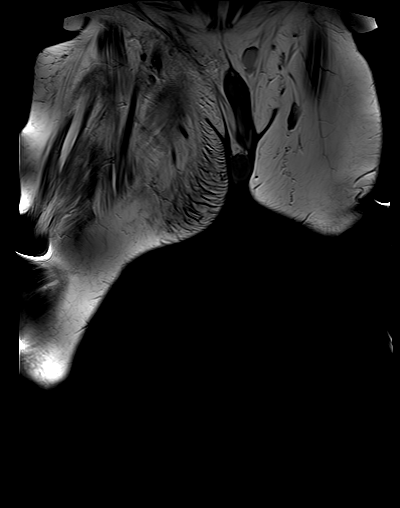
[im 36/38]
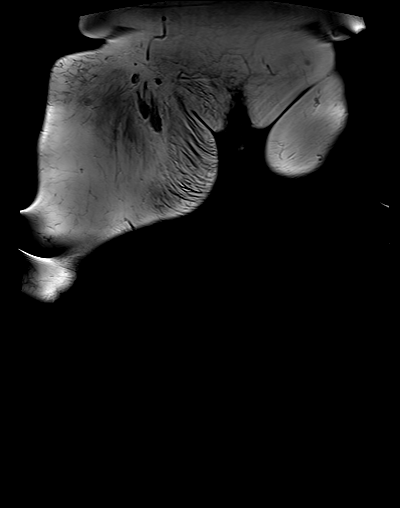

[Series 9: composed cor stir_comp_filt · coronal · right · 6.0mm · 1.25mm/px · 9 of 39 slices shown]
[im 3/39]
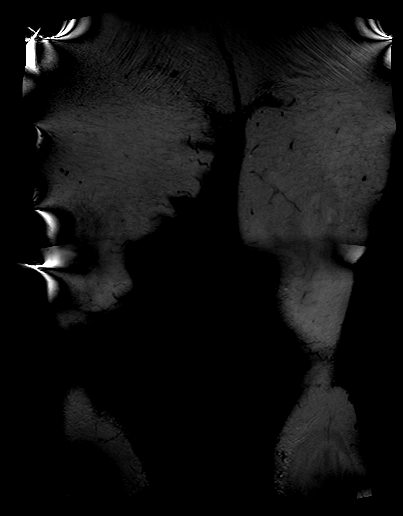
[im 7/39]
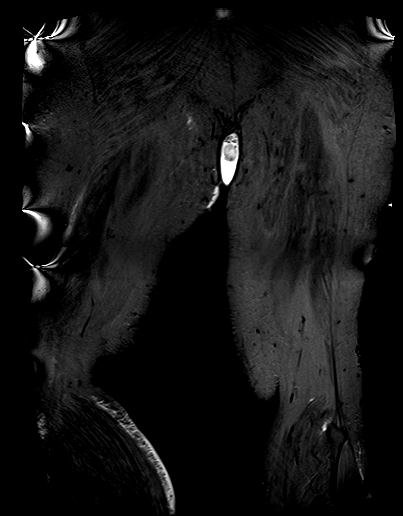
[im 13/39]
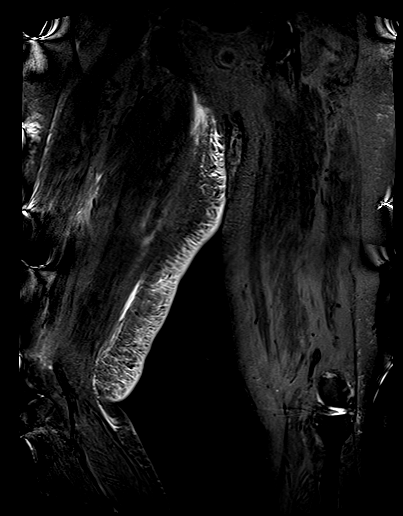
[im 17/39]
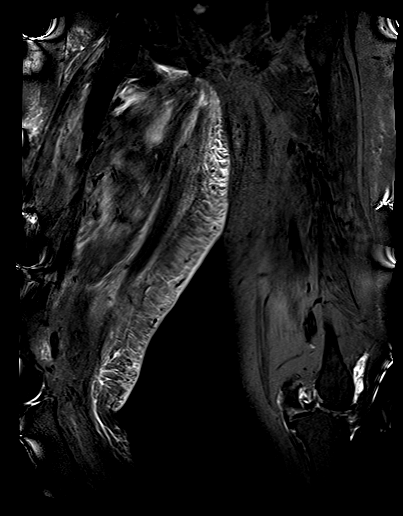
[im 21/39]
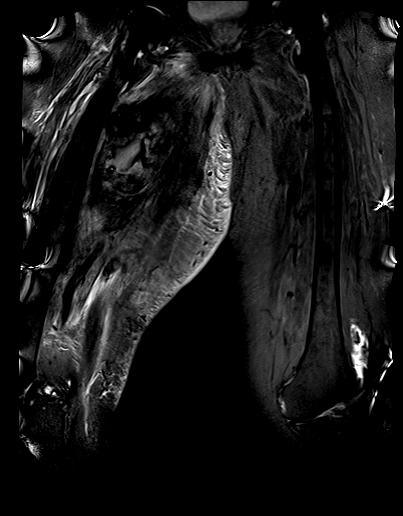
[im 23/39]
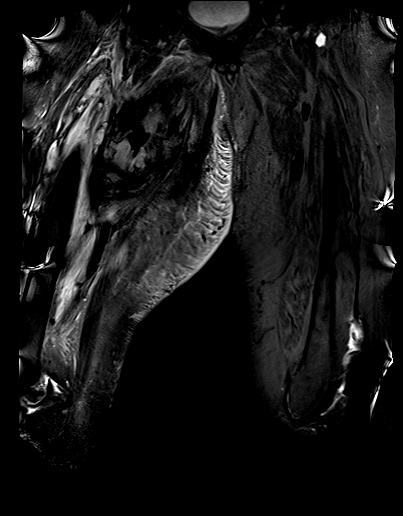
[im 27/39]
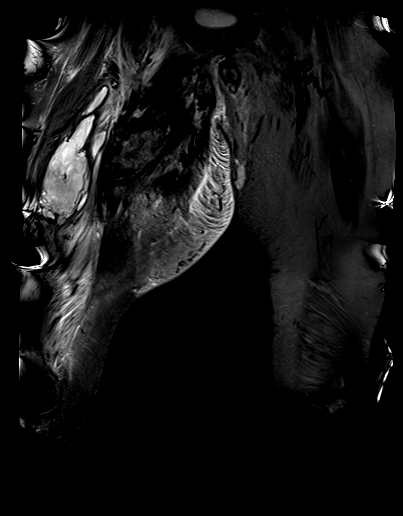
[im 33/39]
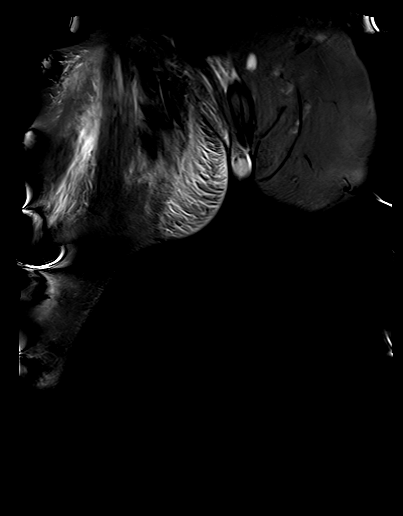
[im 37/39]
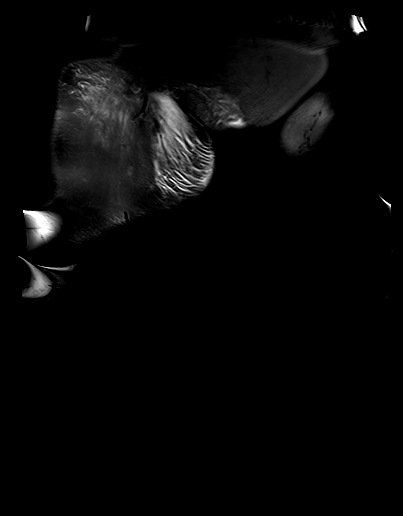

[24 of 40 positions shown; findings below may reference images not displayed]

FINDINGS: Only 2 sequences are provided as the patient was unable to tolerate
further scanning.

Bones/Joint/Cartilage

No fracture, contusion or worrisome lesion is identified.

Ligaments

Negative.

Muscles and Tendons

There is a large heterogeneous collection most consistent with a
hematoma in the musculature of the anterior and medial upper leg
which measures approximately 20 cm craniocaudal by 12 cm transverse.
The entire extent of the collection cannot be measured as only
coronal images are provided.

Soft tissues

Subcutaneous edema is present.
IMPRESSION: Large intramuscular hematoma in the anterior and medial right upper
leg. Superinfection of the hematoma is possible.

No bony x-rays that no acute bony abnormality.

## 2019-06-12 MED ORDER — NITROGLYCERIN 0.4 MG SL SUBL: 0.4000 mg | SUBLINGUAL_TABLET | SUBLINGUAL | Status: DC | PRN

## 2019-06-12 MED ORDER — POTASSIUM CHLORIDE 20 MEQ PO PACK
40.0000 meq | PACK | Freq: Once | ORAL | Status: AC
  Administered 2019-06-12: 40 meq via ORAL
  Filled 2019-06-12: qty 2

## 2019-06-12 MED ORDER — WHITE PETROLATUM EX OINT
TOPICAL_OINTMENT | CUTANEOUS | Status: AC
  Filled 2019-06-12: qty 28.35

## 2019-06-12 MED ORDER — LORAZEPAM 2 MG/ML IJ SOLN
1.0000 mg | Freq: Once | INTRAMUSCULAR | Status: AC
  Administered 2019-06-12: 1 mg via INTRAVENOUS
  Filled 2019-06-12: qty 1

## 2019-06-12 MED ORDER — SODIUM CHLORIDE 0.9 % IV SOLN: INTRAVENOUS | Status: DC

## 2019-06-12 NOTE — Progress Notes (Signed)
Physical Therapy Treatment Patient Details Name: Kyle [name] MRN: [id number] DOB: 1955-10-16 Today's Date: 06/12/2019    History of Present Illness 63 y.o. male with history of atrial fibrillation, hypertension, COPD was recently admitted for acute CVA underwent mechanical thrombectomy for right MCA infarct with patient having dysphagia and difficulty speaking had to undergo PEG tube placement and was discharged to rehab when patient signed out himself [place] and went home and try to eat when he aspirated and became short of breath and EMS was called.      PT Comments    Pt mobility greatly  Limited today due to R groin and knee pain. Pt's R groin hard, swollen, and very painful limiting pt's ability to transfer, stand, and ambulate. Pt eager to go home however crying over the pain in his R groin and Knee and inability to stand or amb. At this time pt is unsafe to return home as he needs to be able to ambulate and ambulate up a ramp to enter home. Pt with decreased safety awareness and decreased insight to deficits. Acute PT to cont to follow.   Follow Up Recommendations  SNF;Supervision/Assistance - 24 hour     Equipment Recommendations  Wheelchair (measurements PT);Wheelchair cushion (measurements PT)    Recommendations for Other Services       Precautions / Restrictions Precautions Precautions: Fall Precaution Comments: R groin and knee pain Restrictions Weight Bearing Restrictions: No    Mobility  Bed Mobility Overal bed mobility: Needs Assistance Bed Mobility: Supine to Sit;Sit to Supine     Supine to sit: Max assist Sit to supine: Mod assist;+2 for physical assistance(to scoot up to [facility])   General bed mobility comments: pt with difficulty moving R LE toward EOB, pt dependent on bed rails, maxA for trunk elevation and to sit square on EOB,  Transfers                 General transfer comment: attempted to stand x 3 however pt in tears due to  R groing and knee pain and was unable to complete standing  Ambulation/Gait             General Gait Details: unable due to R groin and knee pain   Stairs             Wheelchair Mobility    Modified Rankin (Stroke Patients Only) Modified Rankin (Stroke Patients Only) Pre-Morbid Rankin Score: No significant disability Modified Rankin: Moderately severe disability     Balance Overall balance assessment: Needs assistance Sitting-balance support: Bilateral upper extremity supported;Feet supported Sitting balance-Leahy Scale: Fair                                      Cognition Arousal/Alertness: Awake/alert Behavior During Therapy: Restless Overall Cognitive Status: Impaired/Different from baseline Area of Impairment: Safety/judgement;Problem solving;Awareness                   Current Attention Level: Selective   Following Commands: Follows one step commands with increased time;Follows one step commands consistently;Follows multi-step commands inconsistently Safety/Judgement: Decreased awareness of safety;Decreased awareness of deficits Awareness: Emergent Problem Solving: Difficulty sequencing;Requires verbal cues;Requires tactile cues General Comments: pt left [facility], wants to eat/doesn't understand that he aspirates and what that means, pt with decreased safety awareness and mildly impulsive      Exercises      General Comments General  comments (skin integrity, edema, etc.): pt with R tender, swollen and hard R side of groin, pt with discoloration of bilat lower LEs      Pertinent Vitals/Pain Pain Assessment: 0-10 Pain Score: 10-Worst pain ever Pain Location: R groin and R knee, groin is hard and swollen, RN aware, pt in tears Pain Descriptors / Indicators: Crying;Discomfort;Grimacing;Guarding Pain Intervention(s): Limited activity within patient's tolerance(volteran gel applied, RN came to assess)    Home Living                       Prior Function            PT Goals (current goals can now be found in the care plan section) Acute Rehab PT Goals Patient Stated Goal: go home Progress towards PT goals: Not progressing toward goals - comment(due to R LE pain)    Frequency    Min 3X/week      PT Plan Frequency needs to be updated    Co-evaluation              AM-PAC PT "6 Clicks" Mobility   Outcome Measure  Help needed turning from your back to your side while in a flat bed without using bedrails?: A Lot Help needed moving from lying on your back to sitting on the side of a flat bed without using bedrails?: A Lot Help needed moving to and from a bed to a chair (including a wheelchair)?: A Lot Help needed standing up from a chair using your arms (e.g., wheelchair or bedside chair)?: Total Help needed to walk in hospital room?: Total Help needed climbing 3-5 steps with a railing? : Total 6 Click Score: 9    End of Session Equipment Utilized During Treatment: Gait belt Activity Tolerance: Patient limited by pain Patient left: in bed;with call bell/phone within reach;with bed alarm set;with nursing/sitter in room Nurse Communication: Mobility status;Other (comment) PT Visit Diagnosis: Difficulty in walking, not elsewhere classified (R26.2);Muscle weakness (generalized) (M62.81)     Time: [id number] PT Time Calculation (min) (ACUTE ONLY): 24 min  Charges:  $Therapeutic Activity: 23-37 mins                     Kittie Plater, PT, DPT Acute Rehabilitation Services Pager #: [phone] Office #: [phone]    Berline Lopes 06/12/2019, 9:56 AM

## 2019-06-12 NOTE — Progress Notes (Signed)
Right tight 34 inches Left tight 28 inches Will conrinue to monitor patient.

## 2019-06-12 NOTE — Progress Notes (Signed)
PROGRESS NOTE    Kyle [name]  [id number] DOB: 06-27-1955 DOA: 06/08/2019 PCP: [name], MD    Brief Narrative:   Pt with history of atrial fibrillation, hypertension, COPD was recently admitted for acute CVA underwent mechanical thrombectomy for right MCA infarct with patient having dysphagia and difficulty speaking had to undergo PEG tube placement and was discharged to rehab when patient signed out himself [place] and went home and try to eat when he aspirated and became short of breath and EMS was called.  Patient was brought to the ER.  Denies any chest pain shortness of breath abdominal pain or diarrhea. Attempted d/c on 12/25 and then there were questions about tube feeds and meds. Meds given, but HH not set up.  Need safe d/c plan and work of drop in hgb as well as thigh enlargement   Assessment & Plan:   Principal Problem:   Aspiration into airway Active Problems:   Adjustment disorder with mixed anxiety and depressed mood   Essential hypertension   Chronic atrial fibrillation (HCC)   Dysphagia due to recent cerebrovascular accident   Aspiration pneumonia ([place])    Acute respiratory failure with hypoxia secondary to aspiration into the airway with history of dysphagia from recent stroke on PEG tube placement along with non-compliance  -patient had aspiration into the airway after patient attempted to eat after eloping from SNF - Unasyn -x ray with infiltrate on right lower  Persistent A. fib  - metoprolol. -was d/c'd on lovenox BID to SNF-- hold until clear if patient is bleeding into thigh  Recent stroke -Status post thrombectomy during recent admission for stroke. Still with communication issues  Anemia appears to be new- Fe def -iron low -will dose IV fe -r/o bleeding into thigh  Right knee pain -x ray w/o fracture -voltaren gel  Dysphagia on PEG tube diet. -needs safe d/c plan  Chronic pain  -continue home medications for  pain.   DVT prophylaxis: Lovenox on hold for now Code Status: Full Family Communication: patient Disposition Plan: needs safe d/c plan and work up of dropping hgb and enlarging thigh, clearly not appropriate for outpatient status   Consultants:   Palliative care (may have to be seen outpatient)      Antimicrobials: Anti-infectives (From admission, onward)   Start     Dose/Rate Route Frequency Ordered Stop   06/09/19 1645  levofloxacin (LEVAQUIN) tablet 500 mg  Status:  Discontinued     500 mg Per J Tube Daily 06/09/19 1640 06/09/19 1641   06/09/19 1645  levofloxacin (LEVAQUIN) tablet 500 mg  Status:  Discontinued     500 mg Per J Tube Daily 06/09/19 1642 06/09/19 1934   06/09/19 0000  Ampicillin-Sulbactam (UNASYN) 3 g in sodium chloride 0.9 % 100 mL IVPB     3 g 200 mL/hr over 30 Minutes Intravenous Every 6 hours 06/08/19 2340     06/09/19 0000  levofloxacin (LEVAQUIN) 500 MG tablet  Status:  Discontinued     500 mg Per J Tube Daily 06/09/19 1442 06/09/19    06/09/19 0000  levofloxacin (LEVAQUIN) 500 MG tablet     500 mg Per J Tube Daily 06/09/19 1621 06/14/19 2359   06/08/19 1800  Ampicillin-Sulbactam (UNASYN) 3 g in sodium chloride 0.9 % 100 mL IVPB     3 g 200 mL/hr over 30 Minutes Intravenous  Once 06/08/19 1754 06/08/19 1856        Subjective: Having pain in his upper thigh also  wants to eat some ice chips  Objective: Vitals:   06/11/19 1955 06/11/19 2348 06/12/19 0757 06/12/19 1403  BP: (!) 110/51 112/76    Pulse: (!) 103 92    Resp: 20 16    Temp: 98.6 F (37 C) 98.6 F (37 C)    TempSrc: Oral Axillary    SpO2: 96% 96% 96% 97%  Weight:      Height:        Intake/Output Summary (Last 24 hours) at 06/12/2019 1624 Last data filed at 06/12/2019 1300 Gross per 24 hour  Intake 200 ml  Output 0 ml  Net 200 ml   Filed Weights   06/08/19 2335  Weight: (!) 137.4 kg    Examination: In bed, writes to communicate Upper lip bleeding, mouth dry Right  thigh enlarged and firm on the medial side   Data Reviewed: I have personally reviewed following labs and imaging studies  CBC: Recent Labs  Lab 06/08/19 1855 06/09/19 0014 06/10/19 0817 06/11/19 0923  WBC 18.4* 16.5* 16.0* 17.7*  NEUTROABS 14.8*  --   --   --   HGB 10.7* 10.8* 9.2* 8.8*  HCT 36.0* 35.4* 30.2* 28.9*  MCV 96.8 93.4 94.1 96.3  PLT 315 279 253 [id number]   Basic Metabolic Panel: Recent Labs  Lab 06/08/19 1855 06/09/19 0014 06/11/19 0923  NA 146* 149* 145  K 4.3 3.7 3.2*  CL 108 109 109  CO2 26 26 28   GLUCOSE 108* 105* 135*  BUN 50* 46* 26*  CREATININE 1.12 1.04 0.83  CALCIUM 9.2 9.2 8.1*   GFR: Estimated Creatinine Clearance: 139.7 mL/min (by C-G formula based on SCr of 0.83 mg/dL). CBG: Recent Labs  Lab 06/10/19 1620 06/10/19 2349 06/11/19 0859 06/11/19 2341 06/12/19 0748  GLUCAP 130* 127* 148* 105* 114*   Anemia Panel: No results for input(s): VITAMINB12, FOLATE, FERRITIN, TIBC, IRON, RETICCTPCT in the last 72 hours. Urine analysis:    Component Value Date/Time   COLORURINE YELLOW 06/08/2019 2113   APPEARANCEUR CLEAR 06/08/2019 2113   LABSPEC 1.026 06/08/2019 2113   PHURINE 5.0 06/08/2019 2113   GLUCOSEU NEGATIVE 06/08/2019 2113   HGBUR NEGATIVE 06/08/2019 2113   [place] NEGATIVE 06/08/2019 2113   [place] NEGATIVE 06/08/2019 2113   PROTEINUR 30 (A) 06/08/2019 2113   NITRITE NEGATIVE 06/08/2019 2113   LEUKOCYTESUR NEGATIVE 06/08/2019 2113   Sepsis Labs:  Recent Results (from the past 240 hour(s))  Culture, blood (routine x 2)     Status: None (Preliminary result)   Collection Time: 06/08/19  6:55 PM   Specimen: BLOOD  Result Value Ref Range Status   Specimen Description BLOOD LEFT ANTECUBITAL  Final   Special Requests   Final    BOTTLES DRAWN AEROBIC AND ANAEROBIC Blood Culture results may not be optimal due to an inadequate volume of blood received in culture bottles   Culture   Final    NO GROWTH 4 DAYS Performed at [facility], [place] [address] [place], [place] [address]    Report Status PENDING  Incomplete  Respiratory Panel by RT PCR (Flu A&B, Covid) - Nasopharyngeal Swab     Status: None   Collection Time: 06/08/19  7:00 PM   Specimen: Nasopharyngeal Swab  Result Value Ref Range Status   SARS Coronavirus 2 by RT PCR NEGATIVE NEGATIVE Final    Comment: (NOTE) SARS-CoV-2 target nucleic acids are NOT DETECTED. The SARS-CoV-2 RNA is generally detectable in upper respiratoy specimens during the acute phase of infection. The  lowest concentration of SARS-CoV-2 viral copies this assay can detect is 131 copies/mL. A negative result does not preclude SARS-Cov-2 infection and should not be used as the sole basis for treatment or other patient management decisions. A negative result may occur with  improper specimen collection/handling, submission of specimen other than nasopharyngeal swab, presence of viral mutation(s) within the areas targeted by this assay, and inadequate number of viral copies (<131 copies/mL). A negative result must be combined with clinical observations, patient history, and epidemiological information. The expected result is Negative. Fact Sheet for Patients:  PinkCheek.be Fact Sheet for Healthcare Providers:  GravelBags.it This test is not yet ap proved or cleared by the [place] FDA and  has been authorized for detection and/or diagnosis of SARS-CoV-2 by FDA under an Emergency Use Authorization (EUA). This EUA will remain  in effect (meaning this test can be used) for the duration of the COVID-19 declaration under Section 564(b)(1) of the Act, 21 U.S.C. section 360bbb-3(b)(1), unless the authorization is terminated or revoked sooner.    Influenza A by PCR NEGATIVE NEGATIVE Final   Influenza B by PCR NEGATIVE NEGATIVE Final    Comment: (NOTE) The Xpert Xpress SARS-CoV-2/FLU/RSV assay is intended as an aid in  the  diagnosis of influenza from Nasopharyngeal swab specimens and  should not be used as a sole basis for treatment. Nasal washings and  aspirates are unacceptable for Xpert Xpress SARS-CoV-2/FLU/RSV  testing. Fact Sheet for Patients: PinkCheek.be Fact Sheet for Healthcare Providers: GravelBags.it This test is not yet approved or cleared by the [place] FDA and  has been authorized for detection and/or diagnosis of SARS-CoV-2 by  FDA under an Emergency Use Authorization (EUA). This EUA will remain  in effect (meaning this test can be used) for the duration of the  Covid-19 declaration under Section 564(b)(1) of the Act, 21  U.S.C. section 360bbb-3(b)(1), unless the authorization is  terminated or revoked. Performed at [facility], [place] [address] [place], [place] [address]   Culture, blood (routine x 2)     Status: None (Preliminary result)   Collection Time: 06/09/19 12:14 AM   Specimen: BLOOD  Result Value Ref Range Status   Specimen Description BLOOD SITE NOT SPECIFIED  Final   Special Requests   Final    BOTTLES DRAWN AEROBIC AND ANAEROBIC Blood Culture results may not be optimal due to an inadequate volume of blood received in culture bottles   Culture   Final    NO GROWTH 3 DAYS Performed at [facility], [place] [address] [place], [place] [address]    Report Status PENDING  Incomplete         Radiology Studies: DG Chest 2 View  Result Date: 06/11/2019 CLINICAL DATA:  Aspiration EXAM: CHEST - 2 VIEW COMPARISON:  Portable exam 1107 hours compared to 06/08/2019 FINDINGS: Normal heart size, mediastinal contours, and pulmonary vascularity. Atherosclerotic calcification aorta. Peribronchial thickening with slight chronic accentuation of perihilar markings. Medial RIGHT lung base opacity which could represent pneumonia or aspiration. No pleural effusion or pneumothorax. Scattered endplate spur formation  thoracic spine. IMPRESSION: Bronchitic changes with medial RIGHT lung base opacity question pneumonia versus aspiration. Electronically Signed   By: Lavonia Dana M.D.   On: 06/11/2019 11:39        Scheduled Meds: . acetaminophen  1,000 mg Oral Once  . albuterol  2.5 mg Nebulization TID  . diclofenac Sodium  2 g Topical QID  . feeding supplement (PRO-STAT SUGAR FREE 64)  30 mL Per Tube TID  . free water  200 mL Per Tube Q6H  . gabapentin  600 mg Per Tube QID  . LORazepam  1 mg Intravenous Once  . metoprolol tartrate  12.5 mg Per Tube BID  . pantoprazole sodium  40 mg Per Tube Daily  . sertraline  50 mg Per Tube Daily  . white petrolatum       Continuous Infusions: . sodium chloride    . ampicillin-sulbactam (UNASYN) IV 3 g (06/12/19 1248)  . feeding supplement (JEVITY 1.2 CAL) 1,000 mL (06/11/19 0533)     LOS: 0 days    Time spent: 35 minutes  Geradine Girt, DO [facility]   If 7PM-7AM, please contact night-coverage [email or web address] Password [facility] 06/12/2019, 4:24 PM

## 2019-06-13 DIAGNOSIS — S7011XA Contusion of right thigh, initial encounter: Secondary | ICD-10-CM

## 2019-06-13 LAB — GLUCOSE, CAPILLARY
Glucose-Capillary: 96 mg/dL (ref 70–99)
Glucose-Capillary: 110 mg/dL — ABNORMAL HIGH (ref 70–99)
Glucose-Capillary: 108 mg/dL — ABNORMAL HIGH (ref 70–99)
Glucose-Capillary: 107 mg/dL — ABNORMAL HIGH (ref 70–99)

## 2019-06-13 LAB — CBC
HCT: 27.4 % — ABNORMAL LOW (ref 39.0–52.0)
Hemoglobin: 8.2 g/dL — ABNORMAL LOW (ref 13.0–17.0)
MCH: 29.1 pg (ref 26.0–34.0)
MCHC: 29.9 g/dL — ABNORMAL LOW (ref 30.0–36.0)
MCV: 97.2 fL (ref 80.0–100.0)
Platelets: 256 10*3/uL (ref 150–400)
RBC: 2.82 MIL/uL — ABNORMAL LOW (ref 4.22–5.81)
RDW: 20.3 % — ABNORMAL HIGH (ref 11.5–15.5)
WBC: 13.9 10*3/uL — ABNORMAL HIGH (ref 4.0–10.5)
nRBC: 0.3 % — ABNORMAL HIGH (ref 0.0–0.2)

## 2019-06-13 LAB — CULTURE, BLOOD (ROUTINE X 2): Culture: NO GROWTH

## 2019-06-13 MED ORDER — MORPHINE SULFATE (PF) 2 MG/ML IV SOLN
1.0000 mg | INTRAVENOUS | Status: DC | PRN
  Administered 2019-06-13 (×2): 1 mg via INTRAVENOUS
  Filled 2019-06-13 (×2): qty 1

## 2019-06-13 MED ORDER — PRO-STAT SUGAR FREE PO LIQD
30.0000 mL | Freq: Two times a day (BID) | ORAL | Status: DC
  Administered 2019-06-13 – 2019-06-19 (×13): 30 mL
  Filled 2019-06-13 (×12): qty 30

## 2019-06-13 MED ORDER — JEVITY 1.2 CAL PO LIQD
1000.0000 mL | ORAL | Status: DC
  Administered 2019-06-13 – 2019-06-14 (×2): 1000 mL
  Filled 2019-06-13 (×13): qty 1000

## 2019-06-13 MED ORDER — PIPERACILLIN-TAZOBACTAM 3.375 G IVPB
3.3750 g | Freq: Three times a day (TID) | INTRAVENOUS | Status: DC
  Administered 2019-06-13 – 2019-06-18 (×15): 3.375 g via INTRAVENOUS
  Filled 2019-06-13 (×16): qty 50

## 2019-06-13 NOTE — Progress Notes (Signed)
Initial Nutrition Assessment  DOCUMENTATION CODES:   Obesity unspecified  INTERVENTION:   -Resume Jevity 1.2 @ 75 ml/hr via J-tube 30 ml Prostat BID -Continue free water flushes of 200 ml every 6 hours (800 ml) -This regimen provides 2360 kcals, 129g protein and 2252 ml H20  NUTRITION DIAGNOSIS:   Inadequate oral intake related to dysphagia as evidenced by NPO status.  GOAL:   Patient will meet greater than or equal to 90% of their needs  MONITOR:   TF tolerance, Labs, Weight trends, I & O's  REASON FOR ASSESSMENT:   Consult Assessment of nutrition requirement/status, Enteral/tube feeding initiation and management  ASSESSMENT:  63 y.o. Pt with history of atrial fibrillation, hypertension, COPD was recently admitted for acute CVA underwent mechanical thrombectomy for right MCA infarct with patient having dysphagia and difficulty speaking had to undergo J -tube placement and was discharged to rehab when patient signed out himself [place] and went home and try to eat when he aspirated and became short of breath and EMS was called.  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  12/21: s/p J-tube placement, not G-tube. Per chart review, pt's stomach anatomy was not appropriate for G-tube placement.  12/24: admitted for acute respiratory failure with hypoxia 2/2 aspiration after pt attempted to eat  Patient with J-tube, on chronic tube feeds. Pt is unable to speak, writes to communicate. Per SLP note today, pt to remain NPO. Pt with severe aspiration risk.  TF regimen PTA was Jevity 1.2 @ 75 ml/hr and 30 ml Prostat TID via J-tube. Will resume at this time.   Per weight records, pt has lost 38 lbs since 11/6 (11% wt loss x 1.5 months, significant for time frame).  Per nursing documentation, pt with mild-moderate BLE edema.  I/Os: +6.9L since admit UOP: 750 ml x 24 hrs  Medications reviewed. Labs reviewed: CBGs:  96-114   NUTRITION - FOCUSED PHYSICAL EXAM:  Working remotely.  Diet Order:   Diet Order            Diet NPO time specified Except for: Ice Chips  Diet effective now        Diet - low sodium heart healthy              EDUCATION NEEDS:   No education needs have been identified at this time  Skin:  Skin Assessment: Reviewed RN Assessment  Last BM:  12/27 -small  Height:   Ht Readings from Last 1 Encounters:  06/08/19 6\' 3"  (1.905 m)    Weight:   Wt Readings from Last 1 Encounters:  06/08/19 (!) 137.4 kg    Ideal Body Weight:  89.1 kg  BMI:  Body mass index is 37.86 kg/m.  Estimated Nutritional Needs:   Kcal:  2400-2600  Protein:  120-130g  Fluid:  >2L/day   Clayton Bibles, MS, RD, LDN Inpatient Clinical Dietitian Pager: [phone] After Hours Pager: [phone]

## 2019-06-13 NOTE — Evaluation (Signed)
Clinical/Bedside Swallow Evaluation Patient Details  Name: Kyle [name] MRN: [id number] Date of Birth: [birth year]  Today's Date: 06/13/2019 Time: SLP Start Time (ACUTE ONLY): 0850 SLP Stop Time (ACUTE ONLY): 0924 SLP Time Calculation (min) (ACUTE ONLY): 34 min  Past Medical History:  Past Medical History:  Diagnosis Date  . Arthritis   . Atrial fibrillation    HX OF SICK SINUS SYNDROME-ATRIAL FIB  . Back pain, chronic    PT STATES 5 BULGING DISKS AND PINCHED NERVES--PT ON OXYCODONE 4 TIMES A DAY FOR HIS PAIN  . Chest pain 08/26/2015  . Chronic airway obstruction, not elsewhere classified 12/21/2012  . GERD (gastroesophageal reflux disease)   . Hypertension   . Impaired fasting glucose 04/19/2013  . Peripheral vascular disease (HCC)    CHRONIC VENOUS INSUFFICIENCY  . Shortness of breath   . 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 Stroke ([place])   . Wears dentures    Past Surgical History:  Past Surgical History:  Procedure Laterality Date  . ESOPHAGOGASTRODUODENOSCOPY  05/18/2011   Procedure: ESOPHAGOGASTRODUODENOSCOPY (EGD);  Surgeon: Shann Medal, MD;  Location: [facility];  Service: Endoscopy;  Laterality: N/A;  . FOOT SURGERY  2003   right foot surgery from work accident   . GASTRIC BYPASS  10/19/11  . GASTRIC RESTRICTION SURGERY  1992  . IR ANGIO VERTEBRAL SEL SUBCLAVIAN INNOMINATE UNI R MOD SED  04/30/2019  . IR CT HEAD LTD  04/30/2019  . IR PERCUTANEOUS ART THROMBECTOMY/INFUSION INTRACRANIAL INC DIAG ANGIO  04/30/2019  . IR REPLC DUODEN/JEJUNO TUBE PERCUT W/FLUORO  06/05/2019  . LAPAROSCOPIC INSERTION GASTROSTOMY TUBE N/A 05/16/2019   Procedure: LAPAROSCOPIC JEJUNOSTOMY TUBE;  Surgeon: Ralene Ok, MD;  Location: [facility];  Service: General;  Laterality: N/A;  . LEG SURGERY  1998   calcium deposit removed on right  leg   . RADIOLOGY WITH ANESTHESIA N/A 04/30/2019   Procedure: CODE STROKE;   Surgeon: Radiologist, Medication, MD;  Location: [facility];  Service: Radiology;  Laterality: N/A;   HPI:  63 y.o. male with history of atrial fibrillation, hypertension, COPD was recently admitted for acute CVA underwent mechanical thrombectomy for right MCA infarct with patient having dysphagia and difficulty speaking had to undergo PEG tube placement and was discharged to rehab when patient signed out himself [name] and went home and try to eat when he aspirated and became short of breath and EMS was called.     Assessment / Plan / Recommendation Clinical Impression   Pt presents with modest improvements in swallowing function in comparison to previous therapy reports.  He is able to contain boluses of ice chips until they melt at which point he swallows residual thin liquids.  Immediate coughing was elicited in [id number] of trials of ice chips.  When consuming purees, pt demonstrated very prolonged manipulation of boluses and moderate residue remained in the oral cavity post swallow, requiring suction to clear.  At this point, pt continues to present with a severe risk of aspiration in the setting of significant neuro deficits and oral motor weakness.  Pt also remains at a high risk of inadequate hydration and nutrition given the amount of time and energy required to consume boluses.  Pt was in agreement with plan to continue alternative means of nutrition rather than initiating a PO diet; however, he appeared discouraged that therapist did not recommend that he have sips of  water or ice chips and he requested to speak with his doctor privately.  RN present and aware.  Pt may moisten his mouth periodically with mouth swabs as long as oral care is completed regularly.    SLP Visit Diagnosis: Dysphagia, oropharyngeal phase (R13.12)    Aspiration Risk  Severe aspiration risk    Diet Recommendation NPO;Alternative means - long-term   Medication Administration: Via alternative means    Other   Recommendations Oral Care Recommendations: Oral care QID   Follow up Recommendations Skilled Nursing facility      Frequency and Duration min 2x/week          Prognosis Prognosis for Safe Diet Advancement: Good Barriers to Reach Goals: Severity of deficits      Swallow Study   General HPI: 62 y.o. male with history of atrial fibrillation, hypertension, COPD was recently admitted for acute CVA underwent mechanical thrombectomy for right MCA infarct with patient having dysphagia and difficulty speaking had to undergo PEG tube placement and was discharged to rehab when patient signed out himself [name] and went home and try to eat when he aspirated and became short of breath and EMS was called.   Type of Study: Bedside Swallow Evaluation Previous Swallow Assessment: 05/01/2019 Diet Prior to this Study: NPO;PEG tube Temperature Spikes Noted: No Respiratory Status: Nasal cannula History of Recent Intubation: No Date extubated: 04/30/19 Behavior/Cognition: Alert;Cooperative Oral Cavity Assessment: Dry Oral Care Completed by SLP: Yes Oral Cavity - Dentition: Edentulous Vision: Functional for self-feeding Self-Feeding Abilities: Able to feed self Patient Positioning: Upright in bed Baseline Vocal Quality: Low vocal intensity Volitional Cough: Congested;Strong Volitional Swallow: Able to elicit    Oral/Motor/Sensory Function Overall Oral Motor/Sensory Function: Severe impairment Facial ROM: Suspected CN VII (facial) dysfunction Facial Symmetry: Abnormal symmetry left;Suspected CN VII (facial) dysfunction Facial Strength: Reduced left;Suspected CN VII (facial) dysfunction Lingual ROM: Reduced left;Reduced right;Suspected CN XII (hypoglossal) dysfunction Lingual Strength: Suspected CN XII (hypoglossal) dysfunction;Reduced Mandible: Impaired   Ice Chips Ice chips: Impaired Presentation: Spoon Oral Phase Impairments: Reduced labial seal Oral Phase Functional  Implications: Prolonged oral transit Pharyngeal Phase Impairments: Cough - Immediate;Suspected delayed Swallow   Thin Liquid      Nectar Thick     Honey Thick     Puree Puree: Impaired Presentation: Self Fed Oral Phase Impairments: Reduced labial seal Oral Phase Functional Implications: Left anterior spillage;Prolonged oral transit;Oral residue Pharyngeal Phase Impairments: Suspected delayed Swallow   Solid            Lateia Fraser, Elmyra Ricks L 06/13/2019,9:31 AM

## 2019-06-13 NOTE — Progress Notes (Signed)
PROGRESS NOTE    Kyle [name]  [id number] DOB: 03-Jun-1956 DOA: 06/08/2019 PCP: [name], MD    Brief Narrative:   Pt with history of atrial fibrillation, hypertension, COPD was recently admitted for acute CVA underwent mechanical thrombectomy for right MCA infarct with patient having dysphagia and difficulty speaking had to undergo PEG tube placement and was discharged to rehab when patient signed out himself [place] and went home and try to eat when he aspirated and became short of breath and EMS was called.  Patient was brought to the ER.  Denies any chest pain shortness of breath abdominal pain or diarrhea. Attempted d/c on 12/25 and then there were questions about tube feeds and meds. Meds given, but HH not set up.  Need safe d/c plan  And resolution of hematoma   Assessment & Plan:   Principal Problem:   Aspiration into airway Active Problems:   Adjustment disorder with mixed anxiety and depressed mood   Essential hypertension   Chronic atrial fibrillation (HCC)   Dysphagia due to recent cerebrovascular accident   Aspiration pneumonia ([place])   Large right thigh hematoma -MRI shows: Large intramuscular hematoma in the anterior and medial right upper leg. Superinfection of the hematoma is possible. -holding lovenox -trend H/H -additional pain control -daily measurement of thigh -IV zosyn -spoke with orthopedics-- conservative management with IV abx and H/H-- currently no need for surgical intervention  Acute respiratory failure with hypoxia secondary to aspiration into the airway with history of dysphagia from recent stroke on PEG tube placement along with non-compliance  -patient had aspiration into the airway after patient attempted to eat after eloping from SNF - Unasyn-- change to zosyn -x ray with infiltrate on right lower  Persistent A. fib  - metoprolol. -was d/c'd on lovenox BID to SNF -long discussion with patient about his increased  risk of stroke off lovenox but due to hematoma will need to be held.    Recent stroke- thrombotic -Status post thrombectomy during recent admission for stroke. Still with communication issues  Anemia appears to be new- Fe def -iron low -will dose IV fe -from MRI appears to be bleeding into thigh  Right knee pain -x ray w/o fracture -voltaren gel  Dysphagia on PEG tube diet. -I suspect if patient goes home, he will eat what he wants and continue to aspirate  Chronic pain  -continue home medications for pain. -will add IV for breakthrough pain   DVT prophylaxis: Lovenox on hold for now due to thigh hematoma Code Status: Full Family Communication: patient Disposition Plan: needs close monitoring of his Hgb, IV abx and safe d/c plan   Consultants:   Will need outpatient palliative care      Antimicrobials: Anti-infectives (From admission, onward)   Start     Dose/Rate Route Frequency Ordered Stop   06/13/19 1200  piperacillin-tazobactam (ZOSYN) IVPB 3.375 g     3.375 g 12.5 mL/hr over 240 Minutes Intravenous Every 8 hours 06/13/19 1000     06/09/19 1645  levofloxacin (LEVAQUIN) tablet 500 mg  Status:  Discontinued     500 mg Per J Tube Daily 06/09/19 1640 06/09/19 1641   06/09/19 1645  levofloxacin (LEVAQUIN) tablet 500 mg  Status:  Discontinued     500 mg Per J Tube Daily 06/09/19 1642 06/09/19 1934   06/09/19 0000  Ampicillin-Sulbactam (UNASYN) 3 g in sodium chloride 0.9 % 100 mL IVPB  Status:  Discontinued     3 g 200 mL/hr  over 30 Minutes Intravenous Every 6 hours 06/08/19 2340 06/13/19 1000   06/09/19 0000  levofloxacin (LEVAQUIN) 500 MG tablet  Status:  Discontinued     500 mg Per J Tube Daily 06/09/19 1442 06/09/19    06/09/19 0000  levofloxacin (LEVAQUIN) 500 MG tablet     500 mg Per J Tube Daily 06/09/19 1621 06/14/19 2359   06/08/19 1800  Ampicillin-Sulbactam (UNASYN) 3 g in sodium chloride 0.9 % 100 mL IVPB     3 g 200 mL/hr over 30 Minutes Intravenous   Once 06/08/19 1754 06/08/19 1856        Subjective: C/o pain in thigh  Objective: Vitals:   06/12/19 1403 06/12/19 1927 06/13/19 0725 06/13/19 0916  BP:    135/66  Pulse:    83  Resp:      Temp:    (!) 97.1 F (36.2 C)  TempSrc:    Axillary  SpO2: 97% 94% 95% 90%  Weight:      Height:        Intake/Output Summary (Last 24 hours) at 06/13/2019 1449 Last data filed at 06/13/2019 1416 Gross per 24 hour  Intake 750 ml  Output 925 ml  Net -175 ml   Filed Weights   06/08/19 2335  Weight: (!) 137.4 kg    Examination: In bed, communicates by writing and some verbal rrr Thigh on right leg swollen and firm inside (not consistent with compartment syndrome)-- no broken skin Knee with bruising as well Alert-- poor insight   Data Reviewed: I have personally reviewed following labs and imaging studies  CBC: Recent Labs  Lab 06/08/19 1855 06/09/19 0014 06/10/19 0817 06/11/19 0923 06/13/19 0419  WBC 18.4* 16.5* 16.0* 17.7* 13.9*  NEUTROABS 14.8*  --   --   --   --   HGB 10.7* 10.8* 9.2* 8.8* 8.2*  HCT 36.0* 35.4* 30.2* 28.9* 27.4*  MCV 96.8 93.4 94.1 96.3 97.2  PLT 315 279 253 [id number]   Basic Metabolic Panel: Recent Labs  Lab 06/08/19 1855 06/09/19 0014 06/11/19 0923  NA 146* 149* 145  K 4.3 3.7 3.2*  CL 108 109 109  CO2 26 26 28   GLUCOSE 108* 105* 135*  BUN 50* 46* 26*  CREATININE 1.12 1.04 0.83  CALCIUM 9.2 9.2 8.1*   GFR: Estimated Creatinine Clearance: 139.7 mL/min (by C-G formula based on SCr of 0.83 mg/dL). CBG: Recent Labs  Lab 06/11/19 2341 06/12/19 0748 06/12/19 2314 06/13/19 0843 06/13/19 1415  GLUCAP 105* 114* 114* 96 110*   Anemia Panel: No results for input(s): VITAMINB12, FOLATE, FERRITIN, TIBC, IRON, RETICCTPCT in the last 72 hours. Urine analysis:    Component Value Date/Time   COLORURINE YELLOW 06/08/2019 2113   APPEARANCEUR CLEAR 06/08/2019 2113   LABSPEC 1.026 06/08/2019 2113   PHURINE 5.0 06/08/2019 2113   GLUCOSEU  NEGATIVE 06/08/2019 2113   HGBUR NEGATIVE 06/08/2019 2113   [place] NEGATIVE 06/08/2019 2113   [place] NEGATIVE 06/08/2019 2113   PROTEINUR 30 (A) 06/08/2019 2113   NITRITE NEGATIVE 06/08/2019 2113   LEUKOCYTESUR NEGATIVE 06/08/2019 2113   Sepsis Labs:  Recent Results (from the past 240 hour(s))  Culture, blood (routine x 2)     Status: None   Collection Time: 06/08/19  6:55 PM   Specimen: BLOOD  Result Value Ref Range Status   Specimen Description BLOOD LEFT ANTECUBITAL  Final   Special Requests   Final    BOTTLES DRAWN AEROBIC AND ANAEROBIC Blood Culture results may not be optimal  due to an inadequate volume of blood received in culture bottles   Culture   Final    NO GROWTH 5 DAYS Performed at [facility], [place] [address] [place], [place] [address]    Report Status 06/13/2019 FINAL  Final  Respiratory Panel by RT PCR (Flu A&B, Covid) - Nasopharyngeal Swab     Status: None   Collection Time: 06/08/19  7:00 PM   Specimen: Nasopharyngeal Swab  Result Value Ref Range Status   SARS Coronavirus 2 by RT PCR NEGATIVE NEGATIVE Final    Comment: (NOTE) SARS-CoV-2 target nucleic acids are NOT DETECTED. The SARS-CoV-2 RNA is generally detectable in upper respiratoy specimens during the acute phase of infection. The lowest concentration of SARS-CoV-2 viral copies this assay can detect is 131 copies/mL. A negative result does not preclude SARS-Cov-2 infection and should not be used as the sole basis for treatment or other patient management decisions. A negative result may occur with  improper specimen collection/handling, submission of specimen other than nasopharyngeal swab, presence of viral mutation(s) within the areas targeted by this assay, and inadequate number of viral copies (<131 copies/mL). A negative result must be combined with clinical observations, patient history, and epidemiological information. The expected result is Negative. Fact Sheet for Patients:   PinkCheek.be Fact Sheet for Healthcare Providers:  GravelBags.it This test is not yet ap proved or cleared by the [place] FDA and  has been authorized for detection and/or diagnosis of SARS-CoV-2 by FDA under an Emergency Use Authorization (EUA). This EUA will remain  in effect (meaning this test can be used) for the duration of the COVID-19 declaration under Section 564(b)(1) of the Act, 21 U.S.C. section 360bbb-3(b)(1), unless the authorization is terminated or revoked sooner.    Influenza A by PCR NEGATIVE NEGATIVE Final   Influenza B by PCR NEGATIVE NEGATIVE Final    Comment: (NOTE) The Xpert Xpress SARS-CoV-2/FLU/RSV assay is intended as an aid in  the diagnosis of influenza from Nasopharyngeal swab specimens and  should not be used as a sole basis for treatment. Nasal washings and  aspirates are unacceptable for Xpert Xpress SARS-CoV-2/FLU/RSV  testing. Fact Sheet for Patients: PinkCheek.be Fact Sheet for Healthcare Providers: GravelBags.it This test is not yet approved or cleared by the [place] FDA and  has been authorized for detection and/or diagnosis of SARS-CoV-2 by  FDA under an Emergency Use Authorization (EUA). This EUA will remain  in effect (meaning this test can be used) for the duration of the  Covid-19 declaration under Section 564(b)(1) of the Act, 21  U.S.C. section 360bbb-3(b)(1), unless the authorization is  terminated or revoked. Performed at [facility], [place] [address], [place], [place] [address]   Culture, blood (routine x 2)     Status: None (Preliminary result)   Collection Time: 06/09/19 12:14 AM   Specimen: BLOOD  Result Value Ref Range Status   Specimen Description BLOOD SITE NOT SPECIFIED  Final   Special Requests   Final    BOTTLES DRAWN AEROBIC AND ANAEROBIC Blood Culture results may not be optimal due to an  inadequate volume of blood received in culture bottles   Culture   Final    NO GROWTH 4 DAYS Performed at [facility], [place] [address], [place], [place] [address]    Report Status PENDING  Incomplete         Radiology Studies: MR [facility] RIGHT WO CONTRAST  Result Date: 06/13/2019 CLINICAL DATA:  Right upper  leg swelling in a patient who is on Lovenox. Decreased hemoglobin. The patient suffered a fall approximately 3 weeks ago. EXAM: MRI OF THE RIGHT FEMUR WITHOUT CONTRAST TECHNIQUE: Multiplanar, multisequence MR imaging of the right femur was performed. No intravenous contrast was administered. COMPARISON:  Plain films of the right upper leg 06/02/2019. FINDINGS: Only 2 sequences are provided as the patient was unable to tolerate further scanning. Bones/Joint/Cartilage No fracture, contusion or worrisome lesion is identified. Ligaments Negative. Muscles and Tendons There is a large heterogeneous collection most consistent with a hematoma in the musculature of the anterior and medial upper leg which measures approximately 20 cm craniocaudal by 12 cm transverse. The entire extent of the collection cannot be measured as only coronal images are provided. Soft tissues Subcutaneous edema is present. IMPRESSION: Large intramuscular hematoma in the anterior and medial right upper leg. Superinfection of the hematoma is possible. No bony x-rays that no acute bony abnormality. Electronically Signed   By: Inge Rise M.D.   On: 06/13/2019 07:29        Scheduled Meds: . acetaminophen  1,000 mg Oral Once  . diclofenac Sodium  2 g Topical QID  . feeding supplement (PRO-STAT SUGAR FREE 64)  30 mL Per Tube BID  . free water  200 mL Per Tube Q6H  . gabapentin  600 mg Per Tube QID  . metoprolol tartrate  12.5 mg Per Tube BID  . pantoprazole sodium  40 mg Per Tube Daily  . sertraline  50 mg Per Tube Daily   Continuous Infusions: . sodium chloride    . feeding supplement (JEVITY 1.2 CAL) 1,000  mL (06/13/19 1241)  . piperacillin-tazobactam (ZOSYN)  IV 3.375 g (06/13/19 1225)     LOS: 1 day    Time spent: 41 minutes  Geradine Girt, DO [facility]   If 7PM-7AM, please contact night-coverage [email or web address] Password [facility] 06/13/2019, 2:49 PM

## 2019-06-13 NOTE — Progress Notes (Signed)
Physical Therapy Treatment Patient Details Name: Kyle [name] MRN: [id number] DOB: [birth year] Today's Date: 06/13/2019    History of Present Illness 63 y.o. male with history of atrial fibrillation, hypertension, COPD was recently admitted for acute CVA underwent mechanical thrombectomy for right MCA infarct with patient having dysphagia and difficulty speaking had to undergo PEG tube placement and was discharged to rehab when patient signed out himself [name] and went home and try to eat when he aspirated and became short of breath and EMS was called.      PT Comments    Pt continues to be frustrated about R LE pain and inability to eat, hitting bed rail. Pt found to have large R hematoma in R groin. Discussed with Dr. Eliseo Squires regarding changing pain meds as per patient current medications are helping the pain. Pt continues to required maxAX2 for sit to stand, however this is progress as pt unable to even attempt standing yesterday. Continue to recommend SNF upon d/c as pt not safe to d/c home as pt can't stand let alone walk up a ramp to enter home. Pt con't to state he's going home despite extensive education on his risks of falling and aspiration if he does that. Acute PT to cont to follow.    Follow Up Recommendations  SNF;Supervision/Assistance - 24 hour(aware pt is refusing )     Equipment Recommendations  Wheelchair (measurements PT);Wheelchair cushion (measurements PT)    Recommendations for Other Services       Precautions / Restrictions Precautions Precautions: Fall Precaution Comments: R groin, pt found to have large hematoma) and knee pain Restrictions Weight Bearing Restrictions: No    Mobility  Bed Mobility Overal bed mobility: Needs Assistance Bed Mobility: Supine to Sit;Sit to Supine     Supine to sit: Mod assist Sit to supine: Min assist   General bed mobility comments: pt better today, assist for R LE due to pain, modA for trunk elevation due  to body habitus, R groing pain, and generalized weakness  Transfers Overall transfer level: Needs assistance Equipment used: (pulled up on back of chair) Transfers: Sit to/from Stand Sit to Stand: Max assist;+2 physical assistance         General transfer comment: pt completed 1 sit to stand with maxAX2 via gait belt however was unable to tolerate >10 sec. Pt then used the back of recliner to pull self up with modAX2 from PT and tech. unable to use stedy as pt unable able to bring knees closer together  Ambulation/Gait             General Gait Details: unable this date   Stairs             Wheelchair Mobility    Modified Rankin (Stroke Patients Only) Modified Rankin (Stroke Patients Only) Pre-Morbid Rankin Score: No significant disability Modified Rankin: Severe disability     Balance Overall balance assessment: Needs assistance Sitting-balance support: No upper extremity supported Sitting balance-Leahy Scale: Fair     Standing balance support: During functional activity Standing balance-Leahy Scale: Poor Standing balance comment: dependent on physical assist                            Cognition Arousal/Alertness: Awake/alert Behavior During Therapy: Agitated(regarding care and inability to eat) Overall Cognitive Status: Impaired/Different from baseline Area of Impairment: Safety/judgement;Problem solving;Awareness  Following Commands: Follows one step commands consistently Safety/Judgement: Decreased awareness of safety;Decreased awareness of deficits(doesn't understand that he's aspirating) Awareness: Emergent Problem Solving: Difficulty sequencing;Requires verbal cues;Requires tactile cues General Comments: pt with very impaired insight to deficits and safety. Pt refusing any rehab and states "I'm going home" however pt unable to stand let alone walk up a ramp to his house. Pt with no understanding that he's  aspirating and what that means despite extensive education      Exercises      General Comments General comments (skin integrity, edema, etc.): pt with bilat lower extermity discoloration. pt c/o R groing pain, frustrated about decreased functional mobility and not being able to eat      Pertinent Vitals/Pain Pain Assessment: 0-10 Pain Score: 9  Pain Location: R groin and R knee, groin is hard and swollen, RN aware, pt in tears Pain Descriptors / Indicators: Constant;Grimacing;Guarding Pain Intervention(s): Limited activity within patient's tolerance    Home Living                      Prior Function            PT Goals (current goals can now be found in the care plan section) Progress towards PT goals: Progressing toward goals    Frequency    Min 3X/week      PT Plan Current plan remains appropriate    Co-evaluation              AM-PAC PT "6 Clicks" Mobility   Outcome Measure  Help needed turning from your back to your side while in a flat bed without using bedrails?: A Lot Help needed moving from lying on your back to sitting on the side of a flat bed without using bedrails?: A Lot Help needed moving to and from a bed to a chair (including a wheelchair)?: A Lot Help needed standing up from a chair using your arms (e.g., wheelchair or bedside chair)?: Total Help needed to walk in hospital room?: Total Help needed climbing 3-5 steps with a railing? : Total 6 Click Score: 9    End of Session Equipment Utilized During Treatment: Gait belt Activity Tolerance: Patient limited by pain Patient left: in bed;with call bell/phone within reach;with bed alarm set;with nursing/sitter in room Nurse Communication: Mobility status;Other (comment) PT Visit Diagnosis: Difficulty in walking, not elsewhere classified (R26.2);Muscle weakness (generalized) (M62.81)     Time: [id number] PT Time Calculation (min) (ACUTE ONLY): 32 min  Charges:  $Therapeutic  Activity: 23-37 mins                     Kittie Plater, PT, DPT Acute Rehabilitation Services Pager #: [phone] Office #: [phone]    Berline Lopes 06/13/2019, 10:54 AM

## 2019-06-14 DIAGNOSIS — I10 Essential (primary) hypertension: Secondary | ICD-10-CM

## 2019-06-14 DIAGNOSIS — J9601 Acute respiratory failure with hypoxia: Secondary | ICD-10-CM

## 2019-06-14 DIAGNOSIS — Z8673 Personal history of transient ischemic attack (TIA), and cerebral infarction without residual deficits: Secondary | ICD-10-CM

## 2019-06-14 LAB — GLUCOSE, CAPILLARY
Glucose-Capillary: 100 mg/dL — ABNORMAL HIGH (ref 70–99)
Glucose-Capillary: 80 mg/dL (ref 70–99)
Glucose-Capillary: 86 mg/dL (ref 70–99)
Glucose-Capillary: 93 mg/dL (ref 70–99)
Glucose-Capillary: 97 mg/dL (ref 70–99)
Glucose-Capillary: 99 mg/dL (ref 70–99)

## 2019-06-14 LAB — CBC
HCT: 28.9 % — ABNORMAL LOW (ref 39.0–52.0)
Hemoglobin: 8.6 g/dL — ABNORMAL LOW (ref 13.0–17.0)
MCH: 29.2 pg (ref 26.0–34.0)
MCHC: 29.8 g/dL — ABNORMAL LOW (ref 30.0–36.0)
MCV: 98 fL (ref 80.0–100.0)
Platelets: 293 10*3/uL (ref 150–400)
RBC: 2.95 MIL/uL — ABNORMAL LOW (ref 4.22–5.81)
RDW: 20.4 % — ABNORMAL HIGH (ref 11.5–15.5)
WBC: 13.9 10*3/uL — ABNORMAL HIGH (ref 4.0–10.5)
nRBC: 0.2 % (ref 0.0–0.2)

## 2019-06-14 LAB — CULTURE, BLOOD (ROUTINE X 2): Culture: NO GROWTH

## 2019-06-14 LAB — BASIC METABOLIC PANEL
Anion gap: 7 (ref 5–15)
BUN: 26 mg/dL — ABNORMAL HIGH (ref 8–23)
CO2: 26 mmol/L (ref 22–32)
Calcium: 8.2 mg/dL — ABNORMAL LOW (ref 8.9–10.3)
Chloride: 114 mmol/L — ABNORMAL HIGH (ref 98–111)
Creatinine, Ser: 0.67 mg/dL (ref 0.61–1.24)
GFR calc Af Amer: >60 mL/min (ref >60)
GFR calc non Af Amer: >60 mL/min (ref >60)
Glucose, Bld: 94 mg/dL (ref 70–99)
Potassium: 3.8 mmol/L (ref 3.5–5.1)
Sodium: 147 mmol/L — ABNORMAL HIGH (ref 135–145)

## 2019-06-14 MED ORDER — FREE WATER
300.0000 mL | Freq: Four times a day (QID) | Status: DC
  Administered 2019-06-14 – 2019-06-15 (×4): 300 mL

## 2019-06-14 NOTE — Progress Notes (Signed)
Patient ID: Kyle [name], male   DOB: 04-11-1956, 63 y.o.   MRN: [id number]  PROGRESS NOTE    CARVELL LAMBERG  [id number] DOB: [birth year] DOA: 06/08/2019 PCP: [name], MD   Brief Narrative:  63 year old male with history of persistent A. fib, hypertension, COPD, recent admission for acute CVA requiring mechanical thrombectomy for right MCA infarct with subsequent dysphagia/difficulty speaking for which she required PEG tube placement and was discharged to rehab but subsequently signed out [name] and went home and tried to eat when he subsequently aspirated, became more short of breath and presented to the ED.  Was started on intravenous antibiotics.  Subsequently, he was also found to have large right thigh hematoma for which orthopedics recommended conservative management with IV antibiotics and H&H with no need for surgical intervention.  Assessment & Plan:   Large right thigh hematoma -MRI showed: Large intramuscular hematoma in the anterior and medial right upper leg.  Superinfection of the hematoma is possible -Lovenox held. -Monitor H&H.  Hemoglobin 8.6 this morning. -Currently on IV antibiotics.  Prior hospitalist had spoken with orthopedics on phone who recommended conservative management with IV antibiotics, H&H monitoring and no need for current surgical intervention -Daily measurement of thigh  Acute respiratory failure with hypoxia secondary to aspiration into the airway History of dysphagia from recent stroke status post PEG tube placement -Patient had aspiration into airway after attempting to eat at home after eloping from SNF -Continue antibiotics. -SLP has evaluated the patient and still recommend n.p.o. -Currently on room air -Continue PEG tube feeding for now.  Will need arrangement for home PT feeding if adamant about going home.  Currently unsafe to discharge home.  Suspect, he will eat what he wants continue to aspirate after he goes  home.  Persistent A. fib -Currently rate controlled.  Continue metoprolol -Lovenox on hold secondary to hematoma.  Was recently discharged to SNF on Lovenox  Recent thrombotic stroke -Status post thrombectomy during recent admission for stroke.  Still with communication issues and dysphagia  Iron deficiency anemia -Monitor hemoglobin.  Transfuse if hemoglobin is less than 7  Right knee pain -X-ray without fracture.  Continue Voltaren gel  Chronic pain -Continue home medications for pain.  Generalized deconditioning -PT recommending home health PT -Palliative Care consult pending   DVT prophylaxis: Lovenox on hold due to fibroma Code Status: Full Family Communication: None at bedside Disposition Plan: Remain inpatient for IV antibiotics, H&H monitoring and safe discharge plan  Consultants: Palliative care consult is pending  Procedures: None  Antimicrobials:  Anti-infectives (From admission, onward)   Start     Dose/Rate Route Frequency Ordered Stop   06/13/19 1200  piperacillin-tazobactam (ZOSYN) IVPB 3.375 g     3.375 g 12.5 mL/hr over 240 Minutes Intravenous Every 8 hours 06/13/19 1000     06/09/19 1645  levofloxacin (LEVAQUIN) tablet 500 mg  Status:  Discontinued     500 mg Per J Tube Daily 06/09/19 1640 06/09/19 1641   06/09/19 1645  levofloxacin (LEVAQUIN) tablet 500 mg  Status:  Discontinued     500 mg Per J Tube Daily 06/09/19 1642 06/09/19 1934   06/09/19 0000  Ampicillin-Sulbactam (UNASYN) 3 g in sodium chloride 0.9 % 100 mL IVPB  Status:  Discontinued     3 g 200 mL/hr over 30 Minutes Intravenous Every 6 hours 06/08/19 2340 06/13/19 1000   06/09/19 0000  levofloxacin (LEVAQUIN) 500 MG tablet  Status:  Discontinued     500 mg  Per J Tube Daily 06/09/19 1442 06/09/19    06/09/19 0000  levofloxacin (LEVAQUIN) 500 MG tablet     500 mg Per J Tube Daily 06/09/19 1621 06/14/19 2359   06/08/19 1800  Ampicillin-Sulbactam (UNASYN) 3 g in sodium chloride 0.9 % 100 mL  IVPB     3 g 200 mL/hr over 30 Minutes Intravenous  Once 06/08/19 1754 06/08/19 1856       Subjective: Patient seen and examined at bedside.  He is awake he is not able to communicate properly and has issues with dysarthria fever or vomiting reported.  Objective: Vitals:   06/13/19 1633 06/13/19 2200 06/14/19 0500 06/14/19 0901  BP: 123/60 122/60  (!) 97/47  Pulse: 87   79  Resp:    16  Temp: 98 F (36.7 C) 98.2 F (36.8 C)  98.2 F (36.8 C)  TempSrc: Oral Oral    SpO2: 96% 99%    Weight:   (!) 151 kg   Height:        Intake/Output Summary (Last 24 hours) at 06/14/2019 1103 Last data filed at 06/14/2019 0348 Gross per 24 hour  Intake 1663.75 ml  Output 775 ml  Net 888.75 ml   Filed Weights   06/08/19 2335 06/14/19 0500  Weight: (!) 137.4 kg (!) 151 kg    Examination:  General exam: Appears calm and comfortable.  Looks older than stated age.  Awake but dysarthric Respiratory system: Bilateral decreased breath sounds at bases Cardiovascular system: S1 & S2 heard, Rate controlled Gastrointestinal system: Abdomen is nondistended, soft and nontender. Normal bowel sounds heard.  PEG tube present. Extremities: No cyanosis, clubbing; right thigh swelling present   Data Reviewed: I have personally reviewed following labs and imaging studies  CBC: Recent Labs  Lab 06/08/19 1855 06/09/19 0014 06/10/19 0817 06/11/19 0923 06/13/19 0419 06/14/19 0442  WBC 18.4* 16.5* 16.0* 17.7* 13.9* 13.9*  NEUTROABS 14.8*  --   --   --   --   --   HGB 10.7* 10.8* 9.2* 8.8* 8.2* 8.6*  HCT 36.0* 35.4* 30.2* 28.9* 27.4* 28.9*  MCV 96.8 93.4 94.1 96.3 97.2 98.0  PLT 315 279 253 239 256 0000000   Basic Metabolic Panel: Recent Labs  Lab 06/08/19 1855 06/09/19 0014 06/11/19 0923 06/14/19 0442  NA 146* 149* 145 147*  K 4.3 3.7 3.2* 3.8  CL 108 109 109 114*  CO2 26 26 28 26   GLUCOSE 108* 105* 135* 94  BUN 50* 46* 26* 26*  CREATININE 1.12 1.04 0.83 0.67  CALCIUM 9.2 9.2 8.1* 8.2*    GFR: Estimated Creatinine Clearance: 152.4 mL/min (by C-G formula based on SCr of 0.67 mg/dL). Liver Function Tests: No results for input(s): AST, ALT, ALKPHOS, BILITOT, PROT, ALBUMIN in the last 168 hours. No results for input(s): LIPASE, AMYLASE in the last 168 hours. No results for input(s): AMMONIA in the last 168 hours. Coagulation Profile: No results for input(s): INR, PROTIME in the last 168 hours. Cardiac Enzymes: No results for input(s): CKTOTAL, CKMB, CKMBINDEX, TROPONINI in the last 168 hours. BNP (last 3 results) No results for input(s): PROBNP in the last 8760 hours. HbA1C: No results for input(s): HGBA1C in the last 72 hours. CBG: Recent Labs  Lab 06/13/19 1637 06/13/19 2020 06/14/19 0017 06/14/19 0421 06/14/19 0858  GLUCAP 108* 107* 100* 93 97   Lipid Profile: No results for input(s): CHOL, HDL, LDLCALC, TRIG, CHOLHDL, LDLDIRECT in the last 72 hours. Thyroid Function Tests: No results for input(s): TSH, T4TOTAL,  FREET4, T3FREE, THYROIDAB in the last 72 hours. Anemia Panel: No results for input(s): VITAMINB12, FOLATE, FERRITIN, TIBC, IRON, RETICCTPCT in the last 72 hours. Sepsis Labs: Recent Labs  Lab 06/08/19 1954  LATICACIDVEN 1.7    Recent Results (from the past 240 hour(s))  Culture, blood (routine x 2)     Status: None   Collection Time: 06/08/19  6:55 PM   Specimen: BLOOD  Result Value Ref Range Status   Specimen Description BLOOD LEFT ANTECUBITAL  Final   Special Requests   Final    BOTTLES DRAWN AEROBIC AND ANAEROBIC Blood Culture results may not be optimal due to an inadequate volume of blood received in culture bottles   Culture   Final    NO GROWTH 5 DAYS Performed at [facility] [address] [place], [place] [address]    Report Status 06/13/2019 FINAL  Final  Respiratory Panel by RT PCR (Flu A&B, Covid) - Nasopharyngeal Swab     Status: None   Collection Time: 06/08/19  7:00 PM   Specimen: Nasopharyngeal Swab  Result Value  Ref Range Status   SARS Coronavirus 2 by RT PCR NEGATIVE NEGATIVE Final    Comment: (NOTE) SARS-CoV-2 target nucleic acids are NOT DETECTED. The SARS-CoV-2 RNA is generally detectable in upper respiratoy specimens during the acute phase of infection. The lowest concentration of SARS-CoV-2 viral copies this assay can detect is 131 copies/mL. A negative result does not preclude SARS-Cov-2 infection and should not be used as the sole basis for treatment or other patient management decisions. A negative result may occur with  improper specimen collection/handling, submission of specimen other than nasopharyngeal swab, presence of viral mutation(s) within the areas targeted by this assay, and inadequate number of viral copies (<131 copies/mL). A negative result must be combined with clinical observations, patient history, and epidemiological information. The expected result is Negative. Fact Sheet for Patients:  PinkCheek.be Fact Sheet for Healthcare Providers:  GravelBags.it This test is not yet ap proved or cleared by the [place] FDA and  has been authorized for detection and/or diagnosis of SARS-CoV-2 by FDA under an Emergency Use Authorization (EUA). This EUA will remain  in effect (meaning this test can be used) for the duration of the COVID-19 declaration under Section 564(b)(1) of the Act, 21 U.S.C. section 360bbb-3(b)(1), unless the authorization is terminated or revoked sooner.    Influenza A by PCR NEGATIVE NEGATIVE Final   Influenza B by PCR NEGATIVE NEGATIVE Final    Comment: (NOTE) The Xpert Xpress SARS-CoV-2/FLU/RSV assay is intended as an aid in  the diagnosis of influenza from Nasopharyngeal swab specimens and  should not be used as a sole basis for treatment. Nasal washings and  aspirates are unacceptable for Xpert Xpress SARS-CoV-2/FLU/RSV  testing. Fact Sheet for  Patients: PinkCheek.be Fact Sheet for Healthcare Providers: GravelBags.it This test is not yet approved or cleared by the [place] FDA and  has been authorized for detection and/or diagnosis of SARS-CoV-2 by  FDA under an Emergency Use Authorization (EUA). This EUA will remain  in effect (meaning this test can be used) for the duration of the  Covid-19 declaration under Section 564(b)(1) of the Act, 21  U.S.C. section 360bbb-3(b)(1), unless the authorization is  terminated or revoked. Performed at [facility], [place] [address], [place], [place] [address]   Culture, blood (routine x 2)     Status: None (Preliminary result)   Collection Time: 06/09/19 12:14 AM   Specimen: BLOOD  Result Value Ref Range Status   Specimen Description BLOOD SITE NOT SPECIFIED  Final   Special Requests   Final    BOTTLES DRAWN AEROBIC AND ANAEROBIC Blood Culture results may not be optimal due to an inadequate volume of blood received in culture bottles   Culture   Final    NO GROWTH 4 DAYS Performed at [facility], [place] [address], [place], [place] [address]    Report Status PENDING  Incomplete         Radiology Studies: MR [facility] RIGHT WO CONTRAST  Result Date: 06/13/2019 CLINICAL DATA:  Right upper leg swelling in a patient who is on Lovenox. Decreased hemoglobin. The patient suffered a fall approximately 3 weeks ago. EXAM: MRI OF THE RIGHT FEMUR WITHOUT CONTRAST TECHNIQUE: Multiplanar, multisequence MR imaging of the right femur was performed. No intravenous contrast was administered. COMPARISON:  Plain films of the right upper leg 06/02/2019. FINDINGS: Only 2 sequences are provided as the patient was unable to tolerate further scanning. Bones/Joint/Cartilage No fracture, contusion or worrisome lesion is identified. Ligaments Negative. Muscles and Tendons There is a large heterogeneous collection most consistent with a hematoma in  the musculature of the anterior and medial upper leg which measures approximately 20 cm craniocaudal by 12 cm transverse. The entire extent of the collection cannot be measured as only coronal images are provided. Soft tissues Subcutaneous edema is present. IMPRESSION: Large intramuscular hematoma in the anterior and medial right upper leg. Superinfection of the hematoma is possible. No bony x-rays that no acute bony abnormality. Electronically Signed   By: Inge Rise M.D.   On: 06/13/2019 07:29        Scheduled Meds: . acetaminophen  1,000 mg Oral Once  . diclofenac Sodium  2 g Topical QID  . feeding supplement (PRO-STAT SUGAR FREE 64)  30 mL Per Tube BID  . free water  300 mL Per Tube Q6H  . gabapentin  600 mg Per Tube QID  . metoprolol tartrate  12.5 mg Per Tube BID  . pantoprazole sodium  40 mg Per Tube Daily  . sertraline  50 mg Per Tube Daily   Continuous Infusions: . sodium chloride    . feeding supplement (JEVITY 1.2 CAL) 1,000 mL (06/13/19 1241)  . piperacillin-tazobactam (ZOSYN)  IV 3.375 g (06/14/19 0432)          Aline August, MD [facility] 06/14/2019, 11:03 AM

## 2019-06-15 DIAGNOSIS — S7011XS Contusion of right thigh, sequela: Secondary | ICD-10-CM

## 2019-06-15 LAB — CBC WITH DIFFERENTIAL/PLATELET
Abs Immature Granulocytes: 0.05 10*3/uL (ref 0.00–0.07)
Basophils Absolute: 0 10*3/uL (ref 0.0–0.1)
Basophils Relative: 0 %
Eosinophils Absolute: 0.7 10*3/uL — ABNORMAL HIGH (ref 0.0–0.5)
Eosinophils Relative: 7 %
HCT: 27.7 % — ABNORMAL LOW (ref 39.0–52.0)
Hemoglobin: 8.5 g/dL — ABNORMAL LOW (ref 13.0–17.0)
Immature Granulocytes: 1 %
Lymphocytes Relative: 13 %
Lymphs Abs: 1.3 10*3/uL (ref 0.7–4.0)
MCH: 29.5 pg (ref 26.0–34.0)
MCHC: 30.7 g/dL (ref 30.0–36.0)
MCV: 96.2 fL (ref 80.0–100.0)
Monocytes Absolute: 0.8 10*3/uL (ref 0.1–1.0)
Monocytes Relative: 8 %
Neutro Abs: 7.1 10*3/uL (ref 1.7–7.7)
Neutrophils Relative %: 71 %
Platelets: 297 10*3/uL (ref 150–400)
RBC: 2.88 MIL/uL — ABNORMAL LOW (ref 4.22–5.81)
RDW: 21.1 % — ABNORMAL HIGH (ref 11.5–15.5)
WBC: 9.8 10*3/uL (ref 4.0–10.5)
nRBC: 0.2 % (ref 0.0–0.2)

## 2019-06-15 LAB — BASIC METABOLIC PANEL
Anion gap: 8 (ref 5–15)
BUN: 26 mg/dL — ABNORMAL HIGH (ref 8–23)
CO2: 25 mmol/L (ref 22–32)
Calcium: 8.4 mg/dL — ABNORMAL LOW (ref 8.9–10.3)
Chloride: 115 mmol/L — ABNORMAL HIGH (ref 98–111)
Creatinine, Ser: 0.6 mg/dL — ABNORMAL LOW (ref 0.61–1.24)
GFR calc Af Amer: >60 mL/min (ref >60)
GFR calc non Af Amer: >60 mL/min (ref >60)
Glucose, Bld: 96 mg/dL (ref 70–99)
Potassium: 3.9 mmol/L (ref 3.5–5.1)
Sodium: 148 mmol/L — ABNORMAL HIGH (ref 135–145)

## 2019-06-15 LAB — GLUCOSE, CAPILLARY
Glucose-Capillary: 106 mg/dL — ABNORMAL HIGH (ref 70–99)
Glucose-Capillary: 110 mg/dL — ABNORMAL HIGH (ref 70–99)
Glucose-Capillary: 121 mg/dL — ABNORMAL HIGH (ref 70–99)
Glucose-Capillary: 57 mg/dL — ABNORMAL LOW (ref 70–99)
Glucose-Capillary: 75 mg/dL (ref 70–99)
Glucose-Capillary: 85 mg/dL (ref 70–99)
Glucose-Capillary: 98 mg/dL (ref 70–99)

## 2019-06-15 LAB — MAGNESIUM: Magnesium: 2.4 mg/dL (ref 1.7–2.4)

## 2019-06-15 MED ORDER — ORAL CARE MOUTH RINSE
15.0000 mL | Freq: Two times a day (BID) | OROMUCOSAL | Status: DC
  Administered 2019-06-15 – 2019-06-23 (×18): 15 mL via OROMUCOSAL

## 2019-06-15 MED ORDER — ALPRAZOLAM 0.5 MG PO TABS
0.5000 mg | ORAL_TABLET | Freq: Three times a day (TID) | ORAL | Status: DC
  Administered 2019-06-15 – 2019-06-23 (×27): 0.5 mg via ORAL
  Filled 2019-06-15 (×26): qty 1

## 2019-06-15 MED ORDER — FREE WATER
400.0000 mL | Freq: Four times a day (QID) | Status: DC
  Administered 2019-06-15 – 2019-06-18 (×12): 400 mL

## 2019-06-15 MED ORDER — HYDROMORPHONE HCL 1 MG/ML IJ SOLN
0.5000 mg | INTRAMUSCULAR | Status: DC | PRN
  Administered 2019-06-15 – 2019-06-22 (×19): 0.5 mg via INTRAVENOUS
  Filled 2019-06-15 (×19): qty 1

## 2019-06-15 MED ORDER — ALPRAZOLAM 0.5 MG PO TABS
1.0000 mg | ORAL_TABLET | Freq: Two times a day (BID) | ORAL | Status: DC | PRN
  Administered 2019-06-19: 1 mg via ORAL
  Filled 2019-06-15 (×2): qty 2

## 2019-06-15 MED ORDER — DEXTROSE 50 % IV SOLN
INTRAVENOUS | Status: AC
  Filled 2019-06-15: qty 50

## 2019-06-15 MED ORDER — OXYCODONE HCL 5 MG PO TABS
10.0000 mg | ORAL_TABLET | Freq: Three times a day (TID) | ORAL | Status: DC
  Administered 2019-06-15 – 2019-06-23 (×27): 10 mg via ORAL
  Filled 2019-06-15 (×26): qty 2

## 2019-06-15 MED ORDER — GLYCOPYRROLATE 0.2 MG/ML IJ SOLN
0.2000 mg | Freq: Three times a day (TID) | INTRAMUSCULAR | Status: DC
  Administered 2019-06-15 – 2019-06-23 (×26): 0.2 mg via INTRAVENOUS
  Filled 2019-06-15 (×26): qty 1

## 2019-06-15 NOTE — Progress Notes (Signed)
Palliative Medicine RN Note: Rec'd request from Dr Hilma Favors to help locate family. I updated demographics to reflect pt's handwritten list of preferred contacts and attempted to contact family in the order requested.   Brother Legrand Como & Sister in [place] [phone] no answer; left voicemail [phone] line busy x5 tries over 30 minutes  Rick/Eric Rockwell [place] or Wife Kathlee Nations  [phone] line busy x5 tries over 30 minutes  Lanier Clam Gleason [phone] Message that call cannot be completed  Marjie Skiff. Jeovanny Cuadros, RN, BSN, [facility] Palliative Medicine Team 06/15/2019 4:03 PM Office [phone]

## 2019-06-15 NOTE — Progress Notes (Signed)
Palliative Medicine RN Note: PMT consult order remains active. Dr Eliseo Squires had spoken with PMT provider Monday, 12/28, and let us know our team was not needed. However, on chart review, I note that Dr Starla Link is waiting for our consult. We will have a provider see Kyle [name] asap.  Kyle Skiff Al Gagen, RN, BSN, [facility] Palliative Medicine Team 06/15/2019 10:10 AM Office [phone]

## 2019-06-15 NOTE — Progress Notes (Addendum)
Palliative Care Consult Note  Kyle [name] is a 63 yo man who has multiple chronic medical problems who had a MCA R stroke on 04/30/19. Since that time he has profound dysphagia and dysarthria, he is dependent on a PEG tube and has significant impact on his QOL. He is extremely frustrated by in inability to communicate-he can however write very well and uses a notebook to communicate. He has difficulty managing his secretions and is high risk for aspiration. I introduced palliative care services and assessed his knowledge of his condition.  He asked how much longer he would have to have the PEG tube and when he could go home. The bleed in his leg has made walking painful and difficult. He had a profound grief response and was extremely tearful when I told him that he would likely need the PEG for the rest of his life and some form of assistance or care. We discussed who his support people should be and if he had an ACP. He asked me to retrieve his wallet and gave me the following handwritten note.     I confirmed DNR, and that QOL was most important to him. Right now he wants to see his family or at least contact them. He tells me that his cellphone was lost in the ED and that he has no money to get a new one- he said he could call his family and text them if he had his phone- this is very distressing to him.  This patient is extremely distressed, depressed and actively grieving the loss of his independence and function prior to his stoke-he writes that he is scared and cant tell people what he needs or wants. He also asks me how long he has to live and if he is "going to die like this".  Recommendations:  1. DNR 2. Focus on his pain and anxiety- will schedule oxycodone and order hydromorphone for breakthrough pain-opioid use history so he has higher than normal tolerance. 3. For anxiety will start klonopin BID 4. Will need assistance from social work and spiritual care in making contact with his  family-he desperately wants to see them or have contact with them. He also misses his two dogs. 5. Will start robinul to help manage his secretions.  Will follow up tomorrow.  Lane Hacker, DO Palliative Medicine [phone]  Time: 70 minutes Greater than 50%  of this time was spent counseling and coordinating care related to the above assessment and plan.

## 2019-06-15 NOTE — TOC Progression Note (Signed)
Transition of Care [facility]) - Progression Note    Patient Details  Name: [name] MRN: [id number] Date of Birth: [birth year]  Transition of Care [facility]) CM/SW [place], LCSW Phone Number: 06/15/2019, 2:40 PM  Clinical Narrative:    APS, Wyvonnia Dusky [phone]), contacted CSW with questions regarding patient. She reports that [facility] made the APS referral when patient discharged due to safety concerns. The patient apparently paid his neighbor to pick him up and take him home. Andreka visited patient last week and advised him to go to the hospital. She is following up with patient's landlord on whether patient has electricity and water (and she is checking on patient's food stamp status). She is aware patient is refusing SNF but she does not feel patient is safe. He does not have Medicaid and has used several SNF Medicare days, so long term payer source is a concern. She will visit patient again on Monday to see if she can convince him to go to SNF, though she feels he does have capacity.    Expected Discharge Plan: [place] Barriers to Discharge: Continued Medical Work up  Expected Discharge Plan and Services Expected Discharge Plan: [place] In-house Referral: Clinical Social Work Discharge Planning Services: CM Consult Post Acute Care Choice: [place] arrangements for the past 2 months: Single Family Home Expected Discharge Date: 06/09/19               DME Arranged: N/A         HH Arranged: OT, PT, RN [place] Agency: [place] Date [facility] Agency Contacted: 06/09/19 Time Jay: [place] Representative spoke with at [place]: [place] ([place]) Interventions    Readmission Risk Interventions No flowsheet data found.

## 2019-06-15 NOTE — Progress Notes (Signed)
Patient ID: Kyle [name], male   DOB: 1955-10-17, 63 y.o.   MRN: [id number]  PROGRESS NOTE    Kyle [name]  [id number] DOB: 1955-06-22 DOA: 06/08/2019 PCP: [name], MD   Brief Narrative:  63 year old male with history of persistent A. fib, hypertension, COPD, recent admission for acute CVA requiring mechanical thrombectomy for right MCA infarct with subsequent dysphagia/difficulty speaking for which she required PEG tube placement and was discharged to rehab but subsequently signed out [place] and went home and tried to eat when he subsequently aspirated, became more short of breath and presented to the ED.  Was started on intravenous antibiotics.  Subsequently, he was also found to have large right thigh hematoma for which orthopedics recommended conservative management with IV antibiotics and H&H with no need for surgical intervention.  Assessment & Plan:   Large right thigh hematoma -MRI showed: Large intramuscular hematoma in the anterior and medial right upper leg.  Superinfection of the hematoma is possible -Lovenox held. -Monitor H&H.  Hemoglobin 8.5 this morning. -Currently on IV antibiotics.  Prior hospitalist had spoken with orthopedics on phone who recommended conservative management with IV antibiotics, H&H monitoring and no need for current surgical intervention -Daily measurement of thigh  Acute respiratory failure with hypoxia secondary to aspiration into the airway History of dysphagia from recent stroke status post PEG tube placement -Patient had aspiration into airway after attempting to eat at home after eloping from SNF -Continue antibiotics. -SLP has evaluated the patient and still recommend n.p.o. -Currently on room air -Continue PEG tube feeding for now.  Will need arrangement for home PEG tube feeding if adamant about going home.  Currently unsafe to discharge home.  Suspect, he will eat what he wants continue to aspirate after he goes  home.  Leukocytosis -Resolved  Persistent A. fib -Currently rate controlled.  Continue metoprolol -Lovenox on hold secondary to hematoma.  Was recently discharged to SNF on Lovenox  Recent thrombotic stroke -Status post thrombectomy during recent admission for stroke.  Still with communication issues and dysphagia  Iron deficiency anemia -Monitor hemoglobin.  Transfuse if hemoglobin is less than 7  Right knee pain -X-ray without fracture.  Continue Voltaren gel  Chronic pain -Continue home medications for pain.  Generalized deconditioning -PT recommending SNF.  Patient is currently refusing SNF. -Palliative Care consult pending   DVT prophylaxis: Lovenox on hold due to hematoma  code Status: Full Family Communication: None at bedside Disposition Plan: SNF if patient is agreeable. Consultants: Palliative care consult is pending  Procedures: None  Antimicrobials:  Anti-infectives (From admission, onward)   Start     Dose/Rate Route Frequency Ordered Stop   06/13/19 1200  piperacillin-tazobactam (ZOSYN) IVPB 3.375 g     3.375 g 12.5 mL/hr over 240 Minutes Intravenous Every 8 hours 06/13/19 1000     06/09/19 1645  levofloxacin (LEVAQUIN) tablet 500 mg  Status:  Discontinued     500 mg Per J Tube Daily 06/09/19 1640 06/09/19 1641   06/09/19 1645  levofloxacin (LEVAQUIN) tablet 500 mg  Status:  Discontinued     500 mg Per J Tube Daily 06/09/19 1642 06/09/19 1934   06/09/19 0000  Ampicillin-Sulbactam (UNASYN) 3 g in sodium chloride 0.9 % 100 mL IVPB  Status:  Discontinued     3 g 200 mL/hr over 30 Minutes Intravenous Every 6 hours 06/08/19 2340 06/13/19 1000   06/09/19 0000  levofloxacin (LEVAQUIN) 500 MG tablet  Status:  Discontinued  500 mg Per J Tube Daily 06/09/19 1442 06/09/19    06/09/19 0000  levofloxacin (LEVAQUIN) 500 MG tablet     500 mg Per J Tube Daily 06/09/19 1621 06/14/19 2359   06/08/19 1800  Ampicillin-Sulbactam (UNASYN) 3 g in sodium chloride 0.9 % 100  mL IVPB     3 g 200 mL/hr over 30 Minutes Intravenous  Once 06/08/19 1754 06/08/19 1856       Subjective: Patient seen and examined at bedside.  Poor historian.  Awake but dysarthric.  No overnight fever, vomiting reported. Objective: Vitals:   06/14/19 2032 06/14/19 2330 06/15/19 0453 06/15/19 0737  BP: 110/60 110/67    Pulse: 94 87  83  Resp:  17  18  Temp: (!) 97.5 F (36.4 C) 97.7 F (36.5 C)    TempSrc: Oral Oral    SpO2: 96% 97%  97%  Weight:   (!) 153 kg   Height:        Intake/Output Summary (Last 24 hours) at 06/15/2019 0751 Last data filed at 06/15/2019 0501 Gross per 24 hour  Intake 765 ml  Output 1250 ml  Net -485 ml   Filed Weights   06/08/19 2335 06/14/19 0500 06/15/19 0453  Weight: (!) 137.4 kg (!) 151 kg (!) 153 kg    Examination:  General exam: No distress.  Looks older than stated age.  Awake but dysarthric Respiratory system: Bilateral decreased breath sounds at bases; some scattered crackles Cardiovascular system: Rate controlled, S1-S2 heard Gastrointestinal system: Abdomen is nondistended, soft and nontender. Normal bowel sounds heard.  PEG tube present. Extremities: No cyanosis, clubbing; right thigh nontender currently   Data Reviewed: I have personally reviewed following labs and imaging studies  CBC: Recent Labs  Lab 06/08/19 1855 06/10/19 0817 06/11/19 0923 06/13/19 0419 06/14/19 0442 06/15/19 0347  WBC 18.4* 16.0* 17.7* 13.9* 13.9* 9.8  NEUTROABS 14.8*  --   --   --   --  7.1  HGB 10.7* 9.2* 8.8* 8.2* 8.6* 8.5*  HCT 36.0* 30.2* 28.9* 27.4* 28.9* 27.7*  MCV 96.8 94.1 96.3 97.2 98.0 96.2  PLT 315 253 239 256 293 [id number]   Basic Metabolic Panel: Recent Labs  Lab 06/08/19 1855 06/09/19 0014 06/11/19 0923 06/14/19 0442 06/15/19 0347  NA 146* 149* 145 147* 148*  K 4.3 3.7 3.2* 3.8 3.9  CL 108 109 109 114* 115*  CO2 26 26 28 26 25   GLUCOSE 108* 105* 135* 94 96  BUN 50* 46* 26* 26* 26*  CREATININE 1.12 1.04 0.83 0.67 0.60*   CALCIUM 9.2 9.2 8.1* 8.2* 8.4*  MG  --   --   --   --  2.4   GFR: Estimated Creatinine Clearance: 153.5 mL/min (A) (by C-G formula based on SCr of 0.6 mg/dL (L)). Liver Function Tests: No results for input(s): AST, ALT, ALKPHOS, BILITOT, PROT, ALBUMIN in the last 168 hours. No results for input(s): LIPASE, AMYLASE in the last 168 hours. No results for input(s): AMMONIA in the last 168 hours. Coagulation Profile: No results for input(s): INR, PROTIME in the last 168 hours. Cardiac Enzymes: No results for input(s): CKTOTAL, CKMB, CKMBINDEX, TROPONINI in the last 168 hours. BNP (last 3 results) No results for input(s): PROBNP in the last 8760 hours. HbA1C: No results for input(s): HGBA1C in the last 72 hours. CBG: Recent Labs  Lab 06/14/19 1206 06/14/19 1651 06/14/19 2011 06/15/19 0005 06/15/19 0345  GLUCAP 99 86 80 106* 85   Lipid Profile: No results for input(s):  CHOL, HDL, LDLCALC, TRIG, CHOLHDL, LDLDIRECT in the last 72 hours. Thyroid Function Tests: No results for input(s): TSH, T4TOTAL, FREET4, T3FREE, THYROIDAB in the last 72 hours. Anemia Panel: No results for input(s): VITAMINB12, FOLATE, FERRITIN, TIBC, IRON, RETICCTPCT in the last 72 hours. Sepsis Labs: Recent Labs  Lab 06/08/19 1954  LATICACIDVEN 1.7    Recent Results (from the past 240 hour(s))  Culture, blood (routine x 2)     Status: None   Collection Time: 06/08/19  6:55 PM   Specimen: BLOOD  Result Value Ref Range Status   Specimen Description BLOOD LEFT ANTECUBITAL  Final   Special Requests   Final    BOTTLES DRAWN AEROBIC AND ANAEROBIC Blood Culture results may not be optimal due to an inadequate volume of blood received in culture bottles   Culture   Final    NO GROWTH 5 DAYS Performed at [facility] [address], [place], [place] [address]    Report Status 06/13/2019 FINAL  Final  Respiratory Panel by RT PCR (Flu A&B, Covid) - Nasopharyngeal Swab     Status: None   Collection Time:  06/08/19  7:00 PM   Specimen: Nasopharyngeal Swab  Result Value Ref Range Status   SARS Coronavirus 2 by RT PCR NEGATIVE NEGATIVE Final    Comment: (NOTE) SARS-CoV-2 target nucleic acids are NOT DETECTED. The SARS-CoV-2 RNA is generally detectable in upper respiratoy specimens during the acute phase of infection. The lowest concentration of SARS-CoV-2 viral copies this assay can detect is 131 copies/mL. A negative result does not preclude SARS-Cov-2 infection and should not be used as the sole basis for treatment or other patient management decisions. A negative result may occur with  improper specimen collection/handling, submission of specimen other than nasopharyngeal swab, presence of viral mutation(s) within the areas targeted by this assay, and inadequate number of viral copies (<131 copies/mL). A negative result must be combined with clinical observations, patient history, and epidemiological information. The expected result is Negative. Fact Sheet for Patients:  PinkCheek.be Fact Sheet for Healthcare Providers:  GravelBags.it This test is not yet ap proved or cleared by the [place] FDA and  has been authorized for detection and/or diagnosis of SARS-CoV-2 by FDA under an Emergency Use Authorization (EUA). This EUA will remain  in effect (meaning this test can be used) for the duration of the COVID-19 declaration under Section 564(b)(1) of the Act, 21 U.S.C. section 360bbb-3(b)(1), unless the authorization is terminated or revoked sooner.    Influenza A by PCR NEGATIVE NEGATIVE Final   Influenza B by PCR NEGATIVE NEGATIVE Final    Comment: (NOTE) The Xpert Xpress SARS-CoV-2/FLU/RSV assay is intended as an aid in  the diagnosis of influenza from Nasopharyngeal swab specimens and  should not be used as a sole basis for treatment. Nasal washings and  aspirates are unacceptable for Xpert Xpress SARS-CoV-2/FLU/RSV   testing. Fact Sheet for Patients: PinkCheek.be Fact Sheet for Healthcare Providers: GravelBags.it This test is not yet approved or cleared by the [place] FDA and  has been authorized for detection and/or diagnosis of SARS-CoV-2 by  FDA under an Emergency Use Authorization (EUA). This EUA will remain  in effect (meaning this test can be used) for the duration of the  Covid-19 declaration under Section 564(b)(1) of the Act, 21  U.S.C. section 360bbb-3(b)(1), unless the authorization is  terminated or revoked. Performed at [facility], [place] [address] [place], [place] [address]   Culture, blood (routine x  2)     Status: None   Collection Time: 06/09/19 12:14 AM   Specimen: BLOOD  Result Value Ref Range Status   Specimen Description BLOOD SITE NOT SPECIFIED  Final   Special Requests   Final    BOTTLES DRAWN AEROBIC AND ANAEROBIC Blood Culture results may not be optimal due to an inadequate volume of blood received in culture bottles   Culture   Final    NO GROWTH 5 DAYS Performed at [facility], [place] [address], [place], [place] [address]    Report Status 06/14/2019 FINAL  Final         Radiology Studies: No results found.      Scheduled Meds: . acetaminophen  1,000 mg Oral Once  . diclofenac Sodium  2 g Topical QID  . feeding supplement (PRO-STAT SUGAR FREE 64)  30 mL Per Tube BID  . free water  300 mL Per Tube Q6H  . gabapentin  600 mg Per Tube QID  . metoprolol tartrate  12.5 mg Per Tube BID  . pantoprazole sodium  40 mg Per Tube Daily  . sertraline  50 mg Per Tube Daily   Continuous Infusions: . sodium chloride    . feeding supplement (JEVITY 1.2 CAL) 1,000 mL (06/14/19 2048)  . piperacillin-tazobactam (ZOSYN)  IV 3.375 g (06/15/19 0459)          Aline August, MD [facility] 06/15/2019, 7:51 AM

## 2019-06-15 NOTE — Progress Notes (Signed)
I responded to spiritual care consult for support for Pt. Pt was alert and emotional. We talked through writing. He had concerns about his health. He said he does not know what is going to happen with him, but he said how knows he is not well. He stated that he wanted his sister Patsy Gleason [phone]) to be his POA and that he feels that she would be the best person to speak on his behalf. He said that she does not know the severity of his health yet and wants her to "Know the truth." I passed this information to Dr. Hilma Favors. Next, Brand, in tears, was concerned for his dogs. He said Fredirick Lathe [phone]) was taking care of them and requested that he would be put on the list for visitation. He asked me to called him.  I called Ronalee Belts and left a message for him to visit [facility]. Azazel continued with concerns for where he came from. He said that he was not being treated well at the facility he was at and has concerns for the next possible place he might go. I was pastorally present to offer Terry space to communicate concerns. He said he was emotionally better now. I offered him words of comfort, ministry of presence, prayer and empathic listening. Chaplain will follow up as needed.   Chaplain Resident Fidel Levy MA [phone]

## 2019-06-16 DIAGNOSIS — S7011XD Contusion of right thigh, subsequent encounter: Secondary | ICD-10-CM

## 2019-06-16 LAB — CBC WITH DIFFERENTIAL/PLATELET
Abs Immature Granulocytes: 0.05 10*3/uL (ref 0.00–0.07)
Basophils Absolute: 0 10*3/uL (ref 0.0–0.1)
Basophils Relative: 0 %
Eosinophils Absolute: 0.7 10*3/uL — ABNORMAL HIGH (ref 0.0–0.5)
Eosinophils Relative: 7 %
HCT: 30.2 % — ABNORMAL LOW (ref 39.0–52.0)
Hemoglobin: 9 g/dL — ABNORMAL LOW (ref 13.0–17.0)
Immature Granulocytes: 1 %
Lymphocytes Relative: 14 %
Lymphs Abs: 1.3 10*3/uL (ref 0.7–4.0)
MCH: 29.2 pg (ref 26.0–34.0)
MCHC: 29.8 g/dL — ABNORMAL LOW (ref 30.0–36.0)
MCV: 98.1 fL (ref 80.0–100.0)
Monocytes Absolute: 0.7 10*3/uL (ref 0.1–1.0)
Monocytes Relative: 8 %
Neutro Abs: 6.5 10*3/uL (ref 1.7–7.7)
Neutrophils Relative %: 70 %
Platelets: 315 10*3/uL (ref 150–400)
RBC: 3.08 MIL/uL — ABNORMAL LOW (ref 4.22–5.81)
RDW: 21.8 % — ABNORMAL HIGH (ref 11.5–15.5)
WBC: 9.3 10*3/uL (ref 4.0–10.5)
nRBC: 0 % (ref 0.0–0.2)

## 2019-06-16 LAB — BASIC METABOLIC PANEL
Anion gap: 7 (ref 5–15)
BUN: 27 mg/dL — ABNORMAL HIGH (ref 8–23)
CO2: 25 mmol/L (ref 22–32)
Calcium: 8.3 mg/dL — ABNORMAL LOW (ref 8.9–10.3)
Chloride: 111 mmol/L (ref 98–111)
Creatinine, Ser: 0.73 mg/dL (ref 0.61–1.24)
GFR calc Af Amer: >60 mL/min (ref >60)
GFR calc non Af Amer: >60 mL/min (ref >60)
Glucose, Bld: 98 mg/dL (ref 70–99)
Potassium: 4 mmol/L (ref 3.5–5.1)
Sodium: 143 mmol/L (ref 135–145)

## 2019-06-16 LAB — GLUCOSE, CAPILLARY
Glucose-Capillary: 78 mg/dL (ref 70–99)
Glucose-Capillary: 57 mg/dL — ABNORMAL LOW (ref 70–99)
Glucose-Capillary: 98 mg/dL (ref 70–99)
Glucose-Capillary: 95 mg/dL (ref 70–99)
Glucose-Capillary: 83 mg/dL (ref 70–99)
Glucose-Capillary: 39 mg/dL — CL (ref 70–99)
Glucose-Capillary: 120 mg/dL — ABNORMAL HIGH (ref 70–99)

## 2019-06-16 LAB — MAGNESIUM: Magnesium: 2.1 mg/dL (ref 1.7–2.4)

## 2019-06-16 MED ORDER — DEXTROSE 50 % IV SOLN
12.5000 g | INTRAVENOUS | Status: AC
  Administered 2019-06-16: 12.5 g via INTRAVENOUS
  Filled 2019-06-16: qty 50

## 2019-06-16 MED ORDER — DEXTROSE-NACL 5-0.9 % IV SOLN: INTRAVENOUS | Status: DC

## 2019-06-16 MED ORDER — DEXTROSE 50 % IV SOLN
INTRAVENOUS | Status: AC
  Administered 2019-06-16: 50 mL
  Filled 2019-06-16: qty 50

## 2019-06-16 NOTE — Progress Notes (Signed)
Paged on-call via Amion to notify about glu of 39 and no orders for dextrose/hypoglycemia protocol.

## 2019-06-16 NOTE — Progress Notes (Signed)
Palliative Care Follow-up Note  I was able to reach Ginette Pitman (patient's nephew) on behalf of the patient. Ronalee Belts is the patient's only available next of kin. He provided very helpful information and can assist in making a safe care plan. He can provide historical information and information on the patient's current state of affairs.   Summary of Conversation:  1. Patient lived in a trailer prior to admission, very poor, unsafe living conditions- power and electricity are shared and not reliable, same with water, doubtful he has access to either right now.  2. Ronalee Belts has both of his dogs, they are safe and well cared for st his request.  3. The person who called 911 prior to him coming to the hospital took his phone (was not lost in the ED), he erased it and started using it, Ronalee Belts is trying to sort out the bill and service and is trying to get the patient a new phone. Ronalee Belts is also concerned that the person who came and picked him up from the SNF may have his bank card and money now. Ronalee Belts tells me Tahji has been on full disability for many years, doesn't know why he does not have medicaid (he thought that he had it).  4. The patient's sister in [place] has been unreachable for weeks-Mike says that the patient has not had contact with her in over 15 years, he has another sister in [place] and Ronalee Belts has also not been able to reach her.  [place] himself has Leukemia and cannot come to the hospital- he is staying at home to avoid getting COVID, he tells me the best time to reach him is before noon on weekdays, he has offered to talk to the patient about going to a SNF, but he does not want to return to the [facility].   Recommendations: DNR. Patient is poorly coping with his condition and loss of independence. Need to address care plan and at what point would comfort care be appropriate.  Lane Hacker, DO Palliative Medicine [phone]  Time: 35 min Greater than 50%  of this time was  spent counseling and coordinating care related to the above assessment and plan.

## 2019-06-16 NOTE — Progress Notes (Signed)
Patient ID: Kyle [name], male   DOB: 03/05/1956, 64 y.o.   MRN: [id number]  PROGRESS NOTE    Kyle [name]  [id number] DOB: 1955-12-13 DOA: 06/08/2019 PCP: [name], MD   Brief Narrative:  64 year old male with history of persistent A. fib, hypertension, COPD, recent admission for acute CVA requiring mechanical thrombectomy for right MCA infarct with subsequent dysphagia/difficulty speaking for which she required PEG tube placement and was discharged to rehab but subsequently signed out [facility] and went home and tried to eat when he subsequently aspirated, became more short of breath and presented to the ED.  Was started on intravenous antibiotics.  Subsequently, he was also found to have large right thigh hematoma for which orthopedics recommended conservative management with IV antibiotics and H&H with no need for surgical intervention.  Assessment & Plan:   Large right thigh hematoma -MRI showed: Large intramuscular hematoma in the anterior and medial right upper leg.  Superinfection of the hematoma is possible -Lovenox held. -Monitor H&H.  Hemoglobin 9 this morning. -Currently on IV antibiotics.  Prior hospitalist had spoken with orthopedics on phone who recommended conservative management with IV antibiotics, H&H monitoring and no need for current surgical intervention -Daily measurement of thigh  Acute respiratory failure with hypoxia secondary to aspiration into the airway History of dysphagia from recent stroke status post PEG tube placement -Patient had aspiration into airway after attempting to eat at home after eloping from SNF -Continue antibiotics. -SLP has evaluated the patient and still recommend n.p.o. -Currently on room air -Continue PEG tube feeding for now.  Will need arrangement for home PEG tube feeding if adamant about going home.  Currently unsafe to discharge home.  Suspect, he will eat what he wants continue to aspirate after he goes home.   Patient would be appropriate for SNF placement.  Hypernatremia -Improving.  Continue free water through PEG tube  Leukocytosis -Resolved  Persistent A. fib -Currently rate controlled.  Continue metoprolol -Lovenox on hold secondary to hematoma.  Was recently discharged to SNF on Lovenox  Recent thrombotic stroke -Status post thrombectomy during recent admission for stroke.  Still with communication issues and dysphagia  Iron deficiency anemia -Monitor hemoglobin.  Transfuse if hemoglobin is less than 7  Right knee pain -X-ray without fracture.  Continue Voltaren gel  Chronic pain -Continue home medications for pain.  Morbid obesity -Outpatient follow-up  Generalized deconditioning -PT recommending SNF.  Patient is currently refusing SNF. -Palliative Care consult appreciated.  CODE STATUS has been changed to DNR.  DVT prophylaxis: Lovenox on hold due to hematoma  code Status: DNR Family Communication: None at bedside Disposition Plan: SNF if patient is agreeable. Consultants: Palliative care   Procedures: None  Antimicrobials:  Anti-infectives (From admission, onward)   Start     Dose/Rate Route Frequency Ordered Stop   06/13/19 1200  piperacillin-tazobactam (ZOSYN) IVPB 3.375 g     3.375 g 12.5 mL/hr over 240 Minutes Intravenous Every 8 hours 06/13/19 1000     06/09/19 1645  levofloxacin (LEVAQUIN) tablet 500 mg  Status:  Discontinued     500 mg Per J Tube Daily 06/09/19 1640 06/09/19 1641   06/09/19 1645  levofloxacin (LEVAQUIN) tablet 500 mg  Status:  Discontinued     500 mg Per J Tube Daily 06/09/19 1642 06/09/19 1934   06/09/19 0000  Ampicillin-Sulbactam (UNASYN) 3 g in sodium chloride 0.9 % 100 mL IVPB  Status:  Discontinued     3 g 200 mL/hr over  30 Minutes Intravenous Every 6 hours 06/08/19 2340 06/13/19 1000   06/09/19 0000  levofloxacin (LEVAQUIN) 500 MG tablet  Status:  Discontinued     500 mg Per J Tube Daily 06/09/19 1442 06/09/19    06/09/19 0000   levofloxacin (LEVAQUIN) 500 MG tablet     500 mg Per J Tube Daily 06/09/19 1621 06/14/19 2359   06/08/19 1800  Ampicillin-Sulbactam (UNASYN) 3 g in sodium chloride 0.9 % 100 mL IVPB     3 g 200 mL/hr over 30 Minutes Intravenous  Once 06/08/19 1754 06/08/19 1856       Subjective: Patient seen and examined at bedside.  Poor historian.  Awake, nods his head to some questions.  Denies worsening right thigh pain. Objective: Vitals:   06/15/19 1634 06/15/19 2235 06/16/19 0441 06/16/19 0741  BP: 123/61 122/64  119/65  Pulse: 70 76  69  Resp: 17   16  Temp: 98.5 F (36.9 C) 98.1 F (36.7 C)  97.8 F (36.6 C)  TempSrc:  Oral    SpO2: 99% 97%    Weight:   (!) 156.2 kg   Height:        Intake/Output Summary (Last 24 hours) at 06/16/2019 0742 Last data filed at 06/16/2019 0500 Gross per 24 hour  Intake 740 ml  Output 625 ml  Net 115 ml   Filed Weights   06/14/19 0500 06/15/19 0453 06/16/19 0441  Weight: (!) 151 kg (!) 153 kg (!) 156.2 kg    Examination:  General exam: No acute distress.  Looks chronically ill.  Looks older than stated age.  Awake, nods his head to some questions.  Poor historian Respiratory system: Bilateral decreased breath sounds at bases, few crackles.  No wheezing  cardiovascular system: S1-S2 heard, rate controlled Gastrointestinal system: Abdomen is morbidly obese, nondistended, soft and nontender. Normal bowel sounds heard.  PEG tube present. Extremities: No cyanosis; right thigh only minimally tender.  Trace lower extremity edema present   Data Reviewed: I have personally reviewed following labs and imaging studies  CBC: Recent Labs  Lab 06/11/19 0923 06/13/19 0419 06/14/19 0442 06/15/19 0347 06/16/19 0422  WBC 17.7* 13.9* 13.9* 9.8 9.3  NEUTROABS  --   --   --  7.1 6.5  HGB 8.8* 8.2* 8.6* 8.5* 9.0*  HCT 28.9* 27.4* 28.9* 27.7* 30.2*  MCV 96.3 97.2 98.0 96.2 98.1  PLT 239 256 293 297 [id number]   Basic Metabolic Panel: Recent Labs  Lab  06/11/19 0923 06/14/19 0442 06/15/19 0347 06/16/19 0422  NA 145 147* 148* 143  K 3.2* 3.8 3.9 4.0  CL 109 114* 115* 111  CO2 28 26 25 25   GLUCOSE 135* 94 96 98  BUN 26* 26* 26* 27*  CREATININE 0.83 0.67 0.60* 0.73  CALCIUM 8.1* 8.2* 8.4* 8.3*  MG  --   --  2.4 2.1   GFR: Estimated Creatinine Clearance: 155.3 mL/min (by C-G formula based on SCr of 0.73 mg/dL). Liver Function Tests: No results for input(s): AST, ALT, ALKPHOS, BILITOT, PROT, ALBUMIN in the last 168 hours. No results for input(s): LIPASE, AMYLASE in the last 168 hours. No results for input(s): AMMONIA in the last 168 hours. Coagulation Profile: No results for input(s): INR, PROTIME in the last 168 hours. Cardiac Enzymes: No results for input(s): CKTOTAL, CKMB, CKMBINDEX, TROPONINI in the last 168 hours. BNP (last 3 results) No results for input(s): PROBNP in the last 8760 hours. HbA1C: No results for input(s): HGBA1C in the last 72 hours.  CBG: Recent Labs  Lab 06/15/19 1136 06/15/19 1636 06/15/19 1925 06/15/19 2211 06/16/19 0301  GLUCAP 110* 75 121* 98 78   Lipid Profile: No results for input(s): CHOL, HDL, LDLCALC, TRIG, CHOLHDL, LDLDIRECT in the last 72 hours. Thyroid Function Tests: No results for input(s): TSH, T4TOTAL, FREET4, T3FREE, THYROIDAB in the last 72 hours. Anemia Panel: No results for input(s): VITAMINB12, FOLATE, FERRITIN, TIBC, IRON, RETICCTPCT in the last 72 hours. Sepsis Labs: No results for input(s): PROCALCITON, LATICACIDVEN in the last 168 hours.  Recent Results (from the past 240 hour(s))  Culture, blood (routine x 2)     Status: None   Collection Time: 06/08/19  6:55 PM   Specimen: BLOOD  Result Value Ref Range Status   Specimen Description BLOOD LEFT ANTECUBITAL  Final   Special Requests   Final    BOTTLES DRAWN AEROBIC AND ANAEROBIC Blood Culture results may not be optimal due to an inadequate volume of blood received in culture bottles   Culture   Final    NO GROWTH 5  DAYS Performed at [facility], [place] [address], [place], [place] [address]    Report Status 06/13/2019 FINAL  Final  Respiratory Panel by RT PCR (Flu A&B, Covid) - Nasopharyngeal Swab     Status: None   Collection Time: 06/08/19  7:00 PM   Specimen: Nasopharyngeal Swab  Result Value Ref Range Status   SARS Coronavirus 2 by RT PCR NEGATIVE NEGATIVE Final    Comment: (NOTE) SARS-CoV-2 target nucleic acids are NOT DETECTED. The SARS-CoV-2 RNA is generally detectable in upper respiratoy specimens during the acute phase of infection. The lowest concentration of SARS-CoV-2 viral copies this assay can detect is 131 copies/mL. A negative result does not preclude SARS-Cov-2 infection and should not be used as the sole basis for treatment or other patient management decisions. A negative result may occur with  improper specimen collection/handling, submission of specimen other than nasopharyngeal swab, presence of viral mutation(s) within the areas targeted by this assay, and inadequate number of viral copies (<131 copies/mL). A negative result must be combined with clinical observations, patient history, and epidemiological information. The expected result is Negative. Fact Sheet for Patients:  PinkCheek.be Fact Sheet for Healthcare Providers:  GravelBags.it This test is not yet ap proved or cleared by the [place] FDA and  has been authorized for detection and/or diagnosis of SARS-CoV-2 by FDA under an Emergency Use Authorization (EUA). This EUA will remain  in effect (meaning this test can be used) for the duration of the COVID-19 declaration under Section 564(b)(1) of the Act, 21 U.S.C. section 360bbb-3(b)(1), unless the authorization is terminated or revoked sooner.    Influenza A by PCR NEGATIVE NEGATIVE Final   Influenza B by PCR NEGATIVE NEGATIVE Final    Comment: (NOTE) The Xpert Xpress SARS-CoV-2/FLU/RSV assay  is intended as an aid in  the diagnosis of influenza from Nasopharyngeal swab specimens and  should not be used as a sole basis for treatment. Nasal washings and  aspirates are unacceptable for Xpert Xpress SARS-CoV-2/FLU/RSV  testing. Fact Sheet for Patients: PinkCheek.be Fact Sheet for Healthcare Providers: GravelBags.it This test is not yet approved or cleared by the [place] FDA and  has been authorized for detection and/or diagnosis of SARS-CoV-2 by  FDA under an Emergency Use Authorization (EUA). This EUA will remain  in effect (meaning this test can be used) for the duration of the  Covid-19 declaration under Section 564(b)(1) of the Act, 21  U.S.C. section 360bbb-3(b)(1), unless the authorization is  terminated or revoked. Performed at [facility], [place] [address] [place], [place] [address]   Culture, blood (routine x 2)     Status: None   Collection Time: 06/09/19 12:14 AM   Specimen: BLOOD  Result Value Ref Range Status   Specimen Description BLOOD SITE NOT SPECIFIED  Final   Special Requests   Final    BOTTLES DRAWN AEROBIC AND ANAEROBIC Blood Culture results may not be optimal due to an inadequate volume of blood received in culture bottles   Culture   Final    NO GROWTH 5 DAYS Performed at [facility], [place] [address], [place], [place] [address]    Report Status 06/14/2019 FINAL  Final         Radiology Studies: No results found.      Scheduled Meds: . acetaminophen  1,000 mg Oral Once  . ALPRAZolam  0.5 mg Oral TID  . diclofenac Sodium  2 g Topical QID  . feeding supplement (PRO-STAT SUGAR FREE 64)  30 mL Per Tube BID  . free water  400 mL Per Tube Q6H  . gabapentin  600 mg Per Tube QID  . glycopyrrolate  0.2 mg Intravenous TID  . mouth rinse  15 mL Mouth Rinse BID  . metoprolol tartrate  12.5 mg Per Tube BID  . oxyCODONE  10 mg Oral TID  . pantoprazole sodium  40 mg Per  Tube Daily  . sertraline  50 mg Per Tube Daily   Continuous Infusions: . sodium chloride    . feeding supplement (JEVITY 1.2 CAL) 75 mL/hr at 06/15/19 1649  . piperacillin-tazobactam (ZOSYN)  IV 3.375 g (06/16/19 0440)          Aline August, MD [facility] 06/16/2019, 7:42 AM

## 2019-06-17 LAB — BASIC METABOLIC PANEL
Anion gap: 7 (ref 5–15)
BUN: 25 mg/dL — ABNORMAL HIGH (ref 8–23)
CO2: 26 mmol/L (ref 22–32)
Calcium: 8.1 mg/dL — ABNORMAL LOW (ref 8.9–10.3)
Chloride: 105 mmol/L (ref 98–111)
Creatinine, Ser: 0.59 mg/dL — ABNORMAL LOW (ref 0.61–1.24)
GFR calc Af Amer: >60 mL/min (ref >60)
GFR calc non Af Amer: >60 mL/min (ref >60)
Glucose, Bld: 108 mg/dL — ABNORMAL HIGH (ref 70–99)
Potassium: 3.7 mmol/L (ref 3.5–5.1)
Sodium: 138 mmol/L (ref 135–145)

## 2019-06-17 LAB — CBC WITH DIFFERENTIAL/PLATELET
Abs Immature Granulocytes: 0.05 10*3/uL (ref 0.00–0.07)
Basophils Absolute: 0 10*3/uL (ref 0.0–0.1)
Basophils Relative: 0 %
Eosinophils Absolute: 0.7 10*3/uL — ABNORMAL HIGH (ref 0.0–0.5)
Eosinophils Relative: 7 %
HCT: 28.5 % — ABNORMAL LOW (ref 39.0–52.0)
Hemoglobin: 8.6 g/dL — ABNORMAL LOW (ref 13.0–17.0)
Immature Granulocytes: 1 %
Lymphocytes Relative: 14 %
Lymphs Abs: 1.3 10*3/uL (ref 0.7–4.0)
MCH: 29.5 pg (ref 26.0–34.0)
MCHC: 30.2 g/dL (ref 30.0–36.0)
MCV: 97.6 fL (ref 80.0–100.0)
Monocytes Absolute: 0.6 10*3/uL (ref 0.1–1.0)
Monocytes Relative: 7 %
Neutro Abs: 6.6 10*3/uL (ref 1.7–7.7)
Neutrophils Relative %: 71 %
Platelets: 307 10*3/uL (ref 150–400)
RBC: 2.92 MIL/uL — ABNORMAL LOW (ref 4.22–5.81)
RDW: 21 % — ABNORMAL HIGH (ref 11.5–15.5)
WBC: 9.2 10*3/uL (ref 4.0–10.5)
nRBC: 0 % (ref 0.0–0.2)

## 2019-06-17 LAB — MAGNESIUM: Magnesium: 1.9 mg/dL (ref 1.7–2.4)

## 2019-06-17 LAB — GLUCOSE, CAPILLARY
Glucose-Capillary: 103 mg/dL — ABNORMAL HIGH (ref 70–99)
Glucose-Capillary: 103 mg/dL — ABNORMAL HIGH (ref 70–99)
Glucose-Capillary: 133 mg/dL — ABNORMAL HIGH (ref 70–99)
Glucose-Capillary: 40 mg/dL — CL (ref 70–99)
Glucose-Capillary: 42 mg/dL — CL (ref 70–99)
Glucose-Capillary: 55 mg/dL — ABNORMAL LOW (ref 70–99)
Glucose-Capillary: 59 mg/dL — ABNORMAL LOW (ref 70–99)
Glucose-Capillary: 75 mg/dL (ref 70–99)
Glucose-Capillary: 79 mg/dL (ref 70–99)
Glucose-Capillary: 92 mg/dL (ref 70–99)
Glucose-Capillary: 95 mg/dL (ref 70–99)
Glucose-Capillary: 98 mg/dL (ref 70–99)

## 2019-06-17 MED ORDER — DEXTROSE 5 % IV SOLN: INTRAVENOUS | Status: DC

## 2019-06-17 MED ORDER — DEXTROSE 50 % IV SOLN: 12.5000 g | INTRAVENOUS | Status: AC

## 2019-06-17 MED ORDER — DEXTROSE 50 % IV SOLN
12.5000 g | INTRAVENOUS | Status: AC
  Administered 2019-06-17: 12.5 g via INTRAVENOUS
  Filled 2019-06-17: qty 50

## 2019-06-17 MED ORDER — GLUCAGON HCL RDNA (DIAGNOSTIC) 1 MG IJ SOLR
1.0000 mg | INTRAMUSCULAR | Status: AC
  Administered 2019-06-17: 1 mg via INTRAMUSCULAR
  Filled 2019-06-17: qty 1

## 2019-06-17 MED ORDER — DEXTROSE 50 % IV SOLN
25.0000 g | INTRAVENOUS | Status: AC
  Administered 2019-06-17: 25 g via INTRAVENOUS

## 2019-06-17 MED ORDER — DEXTROSE 50 % IV SOLN
INTRAVENOUS | Status: AC
  Administered 2019-06-17: 12.5 g via INTRAVENOUS
  Filled 2019-06-17: qty 50

## 2019-06-17 NOTE — Progress Notes (Signed)
Patient ID: Kyle [name], male   DOB: 1956/01/30, 64 y.o.   MRN: [id number]  PROGRESS NOTE    Kyle [name]  [id number] DOB: 1956/01/21 DOA: 06/08/2019 PCP: [name], MD   Brief Narrative:  64 year old male with history of persistent A. fib, hypertension, COPD, recent admission for acute CVA requiring mechanical thrombectomy for right MCA infarct with subsequent dysphagia/difficulty speaking for which she required PEG tube placement and was discharged to rehab but subsequently signed out [facility] and went home and tried to eat when he subsequently aspirated, became more short of breath and presented to the ED.  Was started on intravenous antibiotics.  Subsequently, he was also found to have large right thigh hematoma for which orthopedics recommended conservative management with IV antibiotics and H&H with no need for surgical intervention.  Assessment & Plan:   Large right thigh hematoma -MRI showed: Large intramuscular hematoma in the anterior and medial right upper leg.  Superinfection of the hematoma is possible -Lovenox held. -Monitor H&H.  Hemoglobin 8.6 this morning. -Currently on IV antibiotics.  Prior hospitalist had spoken with orthopedics on phone who recommended conservative management with IV antibiotics, H&H monitoring and no need for current surgical intervention.  If H&H remains stable tomorrow with no fever spikes, will consider discontinuing antibiotics. -Daily measurement of thigh  Acute respiratory failure with hypoxia secondary to aspiration into the airway History of dysphagia from recent stroke status post PEG tube placement -Patient had aspiration into airway after attempting to eat at home after eloping from SNF -Continue antibiotics. -SLP has evaluated the patient and still recommend n.p.o. -Currently on room air -Continue PEG tube feeding for now. Currently unsafe to discharge home.   Patient would be appropriate for SNF  placement.  Hypernatremia -Improved.  Continue free water through PEG tube  Leukocytosis -Resolved  Persistent A. fib -Currently rate controlled.  Continue metoprolol -Lovenox on hold secondary to hematoma.  Was recently discharged to SNF on Lovenox  Recent thrombotic stroke -Status post thrombectomy during recent admission for stroke.  Still with communication issues and dysphagia  Iron deficiency anemia -Monitor hemoglobin.  Transfuse if hemoglobin is less than 7  Right knee pain -X-ray without fracture.  Continue Voltaren gel  Chronic pain -Continue home medications for pain.  Morbid obesity -Outpatient follow-up  Generalized deconditioning -PT recommending SNF.  Patient is currently refusing SNF. -Palliative Care consult appreciated.  CODE STATUS has been changed to DNR.  DVT prophylaxis: Lovenox on hold due to hematoma  code Status: DNR Family Communication: None at bedside Disposition Plan: SNF if patient is agreeable. Consultants: Palliative care   Procedures: None  Antimicrobials:  Anti-infectives (From admission, onward)   Start     Dose/Rate Route Frequency Ordered Stop   06/13/19 1200  piperacillin-tazobactam (ZOSYN) IVPB 3.375 g     3.375 g 12.5 mL/hr over 240 Minutes Intravenous Every 8 hours 06/13/19 1000     06/09/19 1645  levofloxacin (LEVAQUIN) tablet 500 mg  Status:  Discontinued     500 mg Per J Tube Daily 06/09/19 1640 06/09/19 1641   06/09/19 1645  levofloxacin (LEVAQUIN) tablet 500 mg  Status:  Discontinued     500 mg Per J Tube Daily 06/09/19 1642 06/09/19 1934   06/09/19 0000  Ampicillin-Sulbactam (UNASYN) 3 g in sodium chloride 0.9 % 100 mL IVPB  Status:  Discontinued     3 g 200 mL/hr over 30 Minutes Intravenous Every 6 hours 06/08/19 2340 06/13/19 1000   06/09/19 0000  levofloxacin (LEVAQUIN) 500 MG tablet  Status:  Discontinued     500 mg Per J Tube Daily 06/09/19 1442 06/09/19    06/09/19 0000  levofloxacin (LEVAQUIN) 500 MG tablet      500 mg Per J Tube Daily 06/09/19 1621 06/14/19 2359   06/08/19 1800  Ampicillin-Sulbactam (UNASYN) 3 g in sodium chloride 0.9 % 100 mL IVPB     3 g 200 mL/hr over 30 Minutes Intravenous  Once 06/08/19 1754 06/08/19 1856       Subjective: Patient seen and examined at bedside.  Poor historian.  Awake, nods his head to some questions.  No fever, vomiting, worsening thigh pain reported. Objective: Vitals:   06/16/19 0741 06/16/19 1536 06/16/19 2318 06/17/19 0434  BP: 119/65 (!) 121/58 (!) 123/56   Pulse: 69 64 68   Resp: 16 16 20    Temp: 97.8 F (36.6 C) 98.2 F (36.8 C) 97.8 F (36.6 C)   TempSrc: Oral Oral Oral   SpO2: 100% 100% 98%   Weight:    (!) 156.5 kg  Height:        Intake/Output Summary (Last 24 hours) at 06/17/2019 0752 Last data filed at 06/17/2019 0500 Gross per 24 hour  Intake 910.15 ml  Output 900 ml  Net 10.15 ml   Filed Weights   06/15/19 0453 06/16/19 0441 06/17/19 0434  Weight: (!) 153 kg (!) 156.2 kg (!) 156.5 kg    Examination:  General exam: No distress.  Looks chronically ill.  Looks older than stated age.  Awake, nods his head to some questions.  Poor historian Respiratory system: Bilateral decreased breath sounds at bases with some scattered crackles  cardiovascular system: Rate controlled, S1-S2 heard Gastrointestinal system: Abdomen is morbidly obese, nondistended, soft and nontender. Normal bowel sounds heard.  PEG tube present. Extremities: No cyanosis; right thigh only minimally tender.  Trace lower extremity edema present   Data Reviewed: I have personally reviewed following labs and imaging studies  CBC: Recent Labs  Lab 06/13/19 0419 06/14/19 0442 06/15/19 0347 06/16/19 0422 06/17/19 0235  WBC 13.9* 13.9* 9.8 9.3 9.2  NEUTROABS  --   --  7.1 6.5 6.6  HGB 8.2* 8.6* 8.5* 9.0* 8.6*  HCT 27.4* 28.9* 27.7* 30.2* 28.5*  MCV 97.2 98.0 96.2 98.1 97.6  PLT 256 293 297 315 [id number]   Basic Metabolic Panel: Recent Labs  Lab 06/11/19 0923  06/14/19 0442 06/15/19 0347 06/16/19 0422 06/17/19 0235  NA 145 147* 148* 143 138  K 3.2* 3.8 3.9 4.0 3.7  CL 109 114* 115* 111 105  CO2 28 26 25 25 26   GLUCOSE 135* 94 96 98 108*  BUN 26* 26* 26* 27* 25*  CREATININE 0.83 0.67 0.60* 0.73 0.59*  CALCIUM 8.1* 8.2* 8.4* 8.3* 8.1*  MG  --   --  2.4 2.1 1.9   GFR: Estimated Creatinine Clearance: 155.4 mL/min (A) (by C-G formula based on SCr of 0.59 mg/dL (L)). Liver Function Tests: No results for input(s): AST, ALT, ALKPHOS, BILITOT, PROT, ALBUMIN in the last 168 hours. No results for input(s): LIPASE, AMYLASE in the last 168 hours. No results for input(s): AMMONIA in the last 168 hours. Coagulation Profile: No results for input(s): INR, PROTIME in the last 168 hours. Cardiac Enzymes: No results for input(s): CKTOTAL, CKMB, CKMBINDEX, TROPONINI in the last 168 hours. BNP (last 3 results) No results for input(s): PROBNP in the last 8760 hours. HbA1C: No results for input(s): HGBA1C in the last 72 hours. CBG: Recent  Labs  Lab 06/16/19 1532 06/16/19 1951 06/16/19 2107 06/17/19 0000 06/17/19 0406  GLUCAP 83 39* 120* 92 95   Lipid Profile: No results for input(s): CHOL, HDL, LDLCALC, TRIG, CHOLHDL, LDLDIRECT in the last 72 hours. Thyroid Function Tests: No results for input(s): TSH, T4TOTAL, FREET4, T3FREE, THYROIDAB in the last 72 hours. Anemia Panel: No results for input(s): VITAMINB12, FOLATE, FERRITIN, TIBC, IRON, RETICCTPCT in the last 72 hours. Sepsis Labs: No results for input(s): PROCALCITON, LATICACIDVEN in the last 168 hours.  Recent Results (from the past 240 hour(s))  Culture, blood (routine x 2)     Status: None   Collection Time: 06/08/19  6:55 PM   Specimen: BLOOD  Result Value Ref Range Status   Specimen Description BLOOD LEFT ANTECUBITAL  Final   Special Requests   Final    BOTTLES DRAWN AEROBIC AND ANAEROBIC Blood Culture results may not be optimal due to an inadequate volume of blood received in culture  bottles   Culture   Final    NO GROWTH 5 DAYS Performed at [facility], [place] [address], [place], [place] [address]    Report Status 06/13/2019 FINAL  Final  Respiratory Panel by RT PCR (Flu A&B, Covid) - Nasopharyngeal Swab     Status: None   Collection Time: 06/08/19  7:00 PM   Specimen: Nasopharyngeal Swab  Result Value Ref Range Status   SARS Coronavirus 2 by RT PCR NEGATIVE NEGATIVE Final    Comment: (NOTE) SARS-CoV-2 target nucleic acids are NOT DETECTED. The SARS-CoV-2 RNA is generally detectable in upper respiratoy specimens during the acute phase of infection. The lowest concentration of SARS-CoV-2 viral copies this assay can detect is 131 copies/mL. A negative result does not preclude SARS-Cov-2 infection and should not be used as the sole basis for treatment or other patient management decisions. A negative result may occur with  improper specimen collection/handling, submission of specimen other than nasopharyngeal swab, presence of viral mutation(s) within the areas targeted by this assay, and inadequate number of viral copies (<131 copies/mL). A negative result must be combined with clinical observations, patient history, and epidemiological information. The expected result is Negative. Fact Sheet for Patients:  PinkCheek.be Fact Sheet for Healthcare Providers:  GravelBags.it This test is not yet ap proved or cleared by the [place] FDA and  has been authorized for detection and/or diagnosis of SARS-CoV-2 by FDA under an Emergency Use Authorization (EUA). This EUA will remain  in effect (meaning this test can be used) for the duration of the COVID-19 declaration under Section 564(b)(1) of the Act, 21 U.S.C. section 360bbb-3(b)(1), unless the authorization is terminated or revoked sooner.    Influenza A by PCR NEGATIVE NEGATIVE Final   Influenza B by PCR NEGATIVE NEGATIVE Final    Comment:  (NOTE) The Xpert Xpress SARS-CoV-2/FLU/RSV assay is intended as an aid in  the diagnosis of influenza from Nasopharyngeal swab specimens and  should not be used as a sole basis for treatment. Nasal washings and  aspirates are unacceptable for Xpert Xpress SARS-CoV-2/FLU/RSV  testing. Fact Sheet for Patients: PinkCheek.be Fact Sheet for Healthcare Providers: GravelBags.it This test is not yet approved or cleared by the [place] FDA and  has been authorized for detection and/or diagnosis of SARS-CoV-2 by  FDA under an Emergency Use Authorization (EUA). This EUA will remain  in effect (meaning this test can be used) for the duration of the  Covid-19 declaration under Section 564(b)(1) of the Act, 21  U.S.C. section  360bbb-3(b)(1), unless the authorization is  terminated or revoked. Performed at [facility], [place] [address] [place], [place] [address]   Culture, blood (routine x 2)     Status: None   Collection Time: 06/09/19 12:14 AM   Specimen: BLOOD  Result Value Ref Range Status   Specimen Description BLOOD SITE NOT SPECIFIED  Final   Special Requests   Final    BOTTLES DRAWN AEROBIC AND ANAEROBIC Blood Culture results may not be optimal due to an inadequate volume of blood received in culture bottles   Culture   Final    NO GROWTH 5 DAYS Performed at [facility], [place] [address] [place], [place] [address]    Report Status 06/14/2019 FINAL  Final         Radiology Studies: No results found.      Scheduled Meds: . acetaminophen  1,000 mg Oral Once  . ALPRAZolam  0.5 mg Oral TID  . diclofenac Sodium  2 g Topical QID  . feeding supplement (PRO-STAT SUGAR FREE 64)  30 mL Per Tube BID  . free water  400 mL Per Tube Q6H  . gabapentin  600 mg Per Tube QID  . glycopyrrolate  0.2 mg Intravenous TID  . mouth rinse  15 mL Mouth Rinse BID  . metoprolol tartrate  12.5 mg Per Tube BID  . oxyCODONE  10  mg Oral TID  . pantoprazole sodium  40 mg Per Tube Daily  . sertraline  50 mg Per Tube Daily   Continuous Infusions: . sodium chloride    . feeding supplement (JEVITY 1.2 CAL) 75 mL/hr at 06/16/19 0829  . piperacillin-tazobactam (ZOSYN)  IV 3.375 g (06/17/19 0434)          Aline August, MD [facility] 06/17/2019, 7:52 AM

## 2019-06-17 NOTE — Progress Notes (Signed)
Patient requested to speak to the palliative care chaplain.  The on-call chaplain informed the patient that the palliative chaplain would not be available until later.  The patient still asked the chaplain to pray for him.  The chaplain prayed for the patient and will refer the request to the palliative chaplain.  Brion [facility]iment Chaplain Resident For questions concerning this note please contact me by pager [phone]

## 2019-06-17 NOTE — Progress Notes (Signed)
Physical Therapy Treatment Patient Details Name: Kyle [name] MRN: [id number] DOB: 03-Apr-1956 Today's Date: 06/17/2019    History of Present Illness 64 y.o. male with history of atrial fibrillation, hypertension, COPD was recently admitted for acute CVA underwent mechanical thrombectomy for right MCA infarct with patient having dysphagia and difficulty speaking had to undergo PEG tube placement and was discharged to rehab when patient signed out himself [place] and went home and try to eat when he aspirated and became short of breath and EMS was called.      PT Comments    Pt tolerated treatment well, performing transfers with improved quality asnd bed mobility with decreased assistance requirements. Pt is able to initiate gait training by shuffling side ot side at edge of bed. Pt feels his gait training is limited by need for slippers as socks stick to ground, pt expresses the desire to purchase some but does not have family to do this for him. Charge RN made aware and messaged social worker to see if any donations are possible. Pt continues to remain generally weak in BLE, and has reduced tolerance for standing activities. Pt will benefit from continued acute PT services to improve LE strength/power and to reduce assistance requirements during functional mobility and gait.   Follow Up Recommendations  SNF;Supervision/Assistance - 24 hour(if pt refuses will benefit from HHPT)     Equipment Recommendations  Wheelchair (measurements PT);Wheelchair cushion (measurements PT);Hospital bed;Rolling walker with 5" wheels(mechanical lift)    Recommendations for Other Services       Precautions / Restrictions Precautions Precautions: Fall Precaution Comments: R groin, pt found to have large hematoma) and knee pain Restrictions Weight Bearing Restrictions: No    Mobility  Bed Mobility Overal bed mobility: Needs Assistance Bed Mobility: Supine to Sit;Sit to Supine     Supine  to sit: Min assist;HOB elevated Sit to supine: Min guard   General bed mobility comments: assist for RLE, pt with heavy use of rails  Transfers Overall transfer level: Needs assistance Equipment used: Rolling walker (2 wheeled) Transfers: Sit to/from Stand Sit to Stand: Mod assist;From elevated surface         General transfer comment: pt pulling on bedrails to initiate stand, requiring PT stabilization to reach from rails to RW  Ambulation/Gait Ambulation/Gait assistance: Min assist Gait Distance (Feet): 2 Feet(2 trials) Assistive device: Rolling walker (2 wheeled) Gait Pattern/deviations: Shuffle Gait velocity: very decreased Gait velocity interpretation: <1.31 ft/sec, indicative of household ambulator General Gait Details: side shuffling to left toward [facility]   Stairs             Wheelchair Mobility    Modified Rankin (Stroke Patients Only) Modified Rankin (Stroke Patients Only) Pre-Morbid Rankin Score: No significant disability Modified Rankin: Moderately severe disability     Balance Overall balance assessment: Needs assistance Sitting-balance support: Single extremity supported;Feet supported Sitting balance-Leahy Scale: Fair Sitting balance - Comments: minG-minA with fatigue, pt writing to PT with LUE support of bed Postural control: Left lateral lean Standing balance support: Bilateral upper extremity supported Standing balance-Leahy Scale: Poor Standing balance comment: minA with BUE support of RW                            Cognition Arousal/Alertness: Awake/alert Behavior During Therapy: WFL for tasks assessed/performed Overall Cognitive Status: Within Functional Limits for tasks assessed  Exercises      General Comments General comments (skin integrity, edema, etc.): VSS, RLE hematoma noted      Pertinent Vitals/Pain Pain Assessment: Faces Faces Pain Scale: Hurts little  more Pain Location: RLE Pain Descriptors / Indicators: Grimacing;Guarding Pain Intervention(s): Limited activity within patient's tolerance    Home Living                      Prior Function            PT Goals (current goals can now be found in the care plan section) Acute Rehab PT Goals Patient Stated Goal: to improve mobility Progress towards PT goals: Progressing toward goals    Frequency    Min 3X/week      PT Plan Current plan remains appropriate    Co-evaluation              AM-PAC PT "6 Clicks" Mobility   Outcome Measure  Help needed turning from your back to your side while in a flat bed without using bedrails?: A Lot Help needed moving from lying on your back to sitting on the side of a flat bed without using bedrails?: A Little Help needed moving to and from a bed to a chair (including a wheelchair)?: A Lot Help needed standing up from a chair using your arms (e.g., wheelchair or bedside chair)?: A Lot Help needed to walk in hospital room?: A Lot Help needed climbing 3-5 steps with a railing? : Total 6 Click Score: 12    End of Session Equipment Utilized During Treatment: (pt refusing gait belt) Activity Tolerance: Patient tolerated treatment well Patient left: in bed;with call bell/phone within reach;with bed alarm set Nurse Communication: Mobility status;Other (comment)(desire for pajamas and bedroom shoes) PT Visit Diagnosis: Difficulty in walking, not elsewhere classified (R26.2);Muscle weakness (generalized) (M62.81)     Time: [id number] PT Time Calculation (min) (ACUTE ONLY): 55 min  Charges:  $Gait Training: 8-22 mins $Therapeutic Activity: 38-52 mins                     Zenaida Niece, PT, DPT Acute Rehabilitation Pager: [phone]    Zenaida Niece 06/17/2019, 4:51 PM

## 2019-06-18 LAB — CBC WITH DIFFERENTIAL/PLATELET
Abs Immature Granulocytes: 0.06 10*3/uL (ref 0.00–0.07)
Basophils Absolute: 0 10*3/uL (ref 0.0–0.1)
Basophils Relative: 0 %
Eosinophils Absolute: 0.5 10*3/uL (ref 0.0–0.5)
Eosinophils Relative: 5 %
HCT: 28 % — ABNORMAL LOW (ref 39.0–52.0)
Hemoglobin: 8.8 g/dL — ABNORMAL LOW (ref 13.0–17.0)
Immature Granulocytes: 1 %
Lymphocytes Relative: 11 %
Lymphs Abs: 0.9 10*3/uL (ref 0.7–4.0)
MCH: 29.6 pg (ref 26.0–34.0)
MCHC: 31.4 g/dL (ref 30.0–36.0)
MCV: 94.3 fL (ref 80.0–100.0)
Monocytes Absolute: 0.6 10*3/uL (ref 0.1–1.0)
Monocytes Relative: 7 %
Neutro Abs: 6.4 10*3/uL (ref 1.7–7.7)
Neutrophils Relative %: 76 %
Platelets: 296 10*3/uL (ref 150–400)
RBC: 2.97 MIL/uL — ABNORMAL LOW (ref 4.22–5.81)
RDW: 20.7 % — ABNORMAL HIGH (ref 11.5–15.5)
WBC: 8.5 10*3/uL (ref 4.0–10.5)
nRBC: 0 % (ref 0.0–0.2)

## 2019-06-18 LAB — BASIC METABOLIC PANEL
Anion gap: 7 (ref 5–15)
BUN: 20 mg/dL (ref 8–23)
CO2: 25 mmol/L (ref 22–32)
Calcium: 8 mg/dL — ABNORMAL LOW (ref 8.9–10.3)
Chloride: 100 mmol/L (ref 98–111)
Creatinine, Ser: 0.62 mg/dL (ref 0.61–1.24)
GFR calc Af Amer: >60 mL/min (ref >60)
GFR calc non Af Amer: >60 mL/min (ref >60)
Glucose, Bld: 121 mg/dL — ABNORMAL HIGH (ref 70–99)
Potassium: 3.8 mmol/L (ref 3.5–5.1)
Sodium: 132 mmol/L — ABNORMAL LOW (ref 135–145)

## 2019-06-18 LAB — MAGNESIUM: Magnesium: 1.8 mg/dL (ref 1.7–2.4)

## 2019-06-18 LAB — GLUCOSE, CAPILLARY
Glucose-Capillary: 71 mg/dL (ref 70–99)
Glucose-Capillary: 99 mg/dL (ref 70–99)
Glucose-Capillary: 81 mg/dL (ref 70–99)
Glucose-Capillary: 70 mg/dL (ref 70–99)
Glucose-Capillary: 74 mg/dL (ref 70–99)

## 2019-06-18 MED ORDER — FREE WATER
200.0000 mL | Freq: Four times a day (QID) | Status: DC
  Administered 2019-06-18 – 2019-06-23 (×23): 200 mL

## 2019-06-18 NOTE — Progress Notes (Signed)
Patient ID: Kyle [name], male   DOB: 11-10-1955, 64 y.o.   MRN: [id number]  PROGRESS NOTE    Kyle [name]  [id number] DOB: Oct 15, 1955 DOA: 06/08/2019 PCP: [name], MD   Brief Narrative:  64 year old male with history of persistent A. fib, hypertension, COPD, recent admission for acute CVA requiring mechanical thrombectomy for right MCA infarct with subsequent dysphagia/difficulty speaking for which she required PEG tube placement and was discharged to rehab but subsequently signed out [place] and went home and tried to eat when he subsequently aspirated, became more short of breath and presented to the ED.  Was started on intravenous antibiotics.  Subsequently, he was also found to have large right thigh hematoma for which orthopedics recommended conservative management with IV antibiotics and H&H with no need for surgical intervention.  Assessment & Plan:   Large right thigh hematoma -MRI showed: Large intramuscular hematoma in the anterior and medial right upper leg.  Superinfection of the hematoma is possible -Lovenox held. -Monitor H&H.  Hemoglobin 8.8 this morning. -Currently on IV antibiotics.  Prior hospitalist had spoken with orthopedics on phone who recommended conservative management with IV antibiotics, H&H monitoring and no need for current surgical intervention.  Will DC antibiotics today and monitor. -Daily measurement of thigh  Acute respiratory failure with hypoxia secondary to aspiration into the airway History of dysphagia from recent stroke status post PEG tube placement -Patient had aspiration into airway after attempting to eat at home after eloping from SNF -Continue antibiotics. -SLP has evaluated the patient and still recommend n.p.o. -Currently on room air -Continue PEG tube feeding for now. Currently unsafe to discharge home.   Patient would be appropriate for SNF placement.  Hypoglycemia -Has not presented with hypoglycemia despite  being on tube feedings.  Continue D5 at 100 cc an hour.  Tube feeding as per dietary recommendations.  Hypernatremia -Improved.  Continue free water through PEG tube  Leukocytosis -Resolved  Persistent A. fib -Currently rate controlled.  Continue metoprolol -Lovenox on hold secondary to hematoma.  Was recently discharged to SNF on Lovenox  Recent thrombotic stroke -Status post thrombectomy during recent admission for stroke.  Still with communication issues and dysphagia  Iron deficiency anemia -Monitor hemoglobin.  Transfuse if hemoglobin is less than 7  Right knee pain -X-ray without fracture.  Continue Voltaren gel  Chronic pain -Continue home medications for pain.  Morbid obesity -Outpatient follow-up  Generalized deconditioning -PT recommending SNF.  Patient is currently refusing SNF. -Palliative Care consult appreciated.  CODE STATUS has been changed to DNR.  DVT prophylaxis: Lovenox on hold due to hematoma  code Status: DNR Family Communication: None at bedside Disposition Plan: SNF if patient is agreeable. Consultants: Palliative care   Procedures: None  Antimicrobials:  Anti-infectives (From admission, onward)   Start     Dose/Rate Route Frequency Ordered Stop   06/13/19 1200  piperacillin-tazobactam (ZOSYN) IVPB 3.375 g     3.375 g 12.5 mL/hr over 240 Minutes Intravenous Every 8 hours 06/13/19 1000     06/09/19 1645  levofloxacin (LEVAQUIN) tablet 500 mg  Status:  Discontinued     500 mg Per J Tube Daily 06/09/19 1640 06/09/19 1641   06/09/19 1645  levofloxacin (LEVAQUIN) tablet 500 mg  Status:  Discontinued     500 mg Per J Tube Daily 06/09/19 1642 06/09/19 1934   06/09/19 0000  Ampicillin-Sulbactam (UNASYN) 3 g in sodium chloride 0.9 % 100 mL IVPB  Status:  Discontinued  3 g 200 mL/hr over 30 Minutes Intravenous Every 6 hours 06/08/19 2340 06/13/19 1000   06/09/19 0000  levofloxacin (LEVAQUIN) 500 MG tablet  Status:  Discontinued     500 mg Per J  Tube Daily 06/09/19 1442 06/09/19    06/09/19 0000  levofloxacin (LEVAQUIN) 500 MG tablet     500 mg Per J Tube Daily 06/09/19 1621 06/14/19 2359   06/08/19 1800  Ampicillin-Sulbactam (UNASYN) 3 g in sodium chloride 0.9 % 100 mL IVPB     3 g 200 mL/hr over 30 Minutes Intravenous  Once 06/08/19 1754 06/08/19 1856       Subjective: Patient seen and examined at bedside.  Poor historian.  Nursing staff reports persistent hypoglycemia yesterday.  No overnight fever or vomiting reported. Objective: Vitals:   06/17/19 0434 06/17/19 0859 06/17/19 2313 06/18/19 0257  BP:  117/63 100/62   Pulse:  68 70   Resp:  18 20   Temp:  97.7 F (36.5 C) 98.3 F (36.8 C)   TempSrc:  Oral Oral   SpO2:  99% 96%   Weight: (!) 156.5 kg   (!) 159.4 kg  Height:        Intake/Output Summary (Last 24 hours) at 06/18/2019 0740 Last data filed at 06/18/2019 0600 Gross per 24 hour  Intake 2715.46 ml  Output 1850 ml  Net 865.46 ml   Filed Weights   06/16/19 0441 06/17/19 0434 06/18/19 0257  Weight: (!) 156.2 kg (!) 156.5 kg (!) 159.4 kg    Examination:  General exam: No acute distress.  Looks chronically ill.  Looks older than stated age.  Poor historian.  Nods his head to some questions. Respiratory system: Bilateral decreased breath sounds at bases with scattered crackles.  No wheezing  cardiovascular system: S1-S2 heard, rate controlled Gastrointestinal system: Abdomen is morbidly obese, nondistended, soft and nontender.  Bowel sounds heard.  PEG tube present. Extremities: No cyanosis; right thigh only minimally tender.  Trace lower extremity edema present   Data Reviewed: I have personally reviewed following labs and imaging studies  CBC: Recent Labs  Lab 06/13/19 0419 06/14/19 0442 06/15/19 0347 06/16/19 0422 06/17/19 0235  WBC 13.9* 13.9* 9.8 9.3 9.2  NEUTROABS  --   --  7.1 6.5 6.6  HGB 8.2* 8.6* 8.5* 9.0* 8.6*  HCT 27.4* 28.9* 27.7* 30.2* 28.5*  MCV 97.2 98.0 96.2 98.1 97.6  PLT 256  293 297 315 [id number]   Basic Metabolic Panel: Recent Labs  Lab 06/11/19 0923 06/14/19 0442 06/15/19 0347 06/16/19 0422 06/17/19 0235  NA 145 147* 148* 143 138  K 3.2* 3.8 3.9 4.0 3.7  CL 109 114* 115* 111 105  CO2 28 26 25 25 26   GLUCOSE 135* 94 96 98 108*  BUN 26* 26* 26* 27* 25*  CREATININE 0.83 0.67 0.60* 0.73 0.59*  CALCIUM 8.1* 8.2* 8.4* 8.3* 8.1*  MG  --   --  2.4 2.1 1.9   GFR: Estimated Creatinine Clearance: 157 mL/min (A) (by C-G formula based on SCr of 0.59 mg/dL (L)). Liver Function Tests: No results for input(s): AST, ALT, ALKPHOS, BILITOT, PROT, ALBUMIN in the last 168 hours. No results for input(s): LIPASE, AMYLASE in the last 168 hours. No results for input(s): AMMONIA in the last 168 hours. Coagulation Profile: No results for input(s): INR, PROTIME in the last 168 hours. Cardiac Enzymes: No results for input(s): CKTOTAL, CKMB, CKMBINDEX, TROPONINI in the last 168 hours. BNP (last 3 results) No results for input(s): PROBNP in  the last 8760 hours. HbA1C: No results for input(s): HGBA1C in the last 72 hours. CBG: Recent Labs  Lab 06/17/19 1703 06/17/19 1740 06/17/19 2019 06/17/19 2355 06/18/19 0350  GLUCAP 59* 75 103* 79 71   Lipid Profile: No results for input(s): CHOL, HDL, LDLCALC, TRIG, CHOLHDL, LDLDIRECT in the last 72 hours. Thyroid Function Tests: No results for input(s): TSH, T4TOTAL, FREET4, T3FREE, THYROIDAB in the last 72 hours. Anemia Panel: No results for input(s): VITAMINB12, FOLATE, FERRITIN, TIBC, IRON, RETICCTPCT in the last 72 hours. Sepsis Labs: No results for input(s): PROCALCITON, LATICACIDVEN in the last 168 hours.  Recent Results (from the past 240 hour(s))  Culture, blood (routine x 2)     Status: None   Collection Time: 06/08/19  6:55 PM   Specimen: BLOOD  Result Value Ref Range Status   Specimen Description BLOOD LEFT ANTECUBITAL  Final   Special Requests   Final    BOTTLES DRAWN AEROBIC AND ANAEROBIC Blood Culture results  may not be optimal due to an inadequate volume of blood received in culture bottles   Culture   Final    NO GROWTH 5 DAYS Performed at [facility], [place] [address] [place], [place] [address]    Report Status 06/13/2019 FINAL  Final  Respiratory Panel by RT PCR (Flu A&B, Covid) - Nasopharyngeal Swab     Status: None   Collection Time: 06/08/19  7:00 PM   Specimen: Nasopharyngeal Swab  Result Value Ref Range Status   SARS Coronavirus 2 by RT PCR NEGATIVE NEGATIVE Final    Comment: (NOTE) SARS-CoV-2 target nucleic acids are NOT DETECTED. The SARS-CoV-2 RNA is generally detectable in upper respiratoy specimens during the acute phase of infection. The lowest concentration of SARS-CoV-2 viral copies this assay can detect is 131 copies/mL. A negative result does not preclude SARS-Cov-2 infection and should not be used as the sole basis for treatment or other patient management decisions. A negative result may occur with  improper specimen collection/handling, submission of specimen other than nasopharyngeal swab, presence of viral mutation(s) within the areas targeted by this assay, and inadequate number of viral copies (<131 copies/mL). A negative result must be combined with clinical observations, patient history, and epidemiological information. The expected result is Negative. Fact Sheet for Patients:  PinkCheek.be Fact Sheet for Healthcare Providers:  GravelBags.it This test is not yet ap proved or cleared by the [place] FDA and  has been authorized for detection and/or diagnosis of SARS-CoV-2 by FDA under an Emergency Use Authorization (EUA). This EUA will remain  in effect (meaning this test can be used) for the duration of the COVID-19 declaration under Section 564(b)(1) of the Act, 21 U.S.C. section 360bbb-3(b)(1), unless the authorization is terminated or revoked sooner.    Influenza A by PCR NEGATIVE  NEGATIVE Final   Influenza B by PCR NEGATIVE NEGATIVE Final    Comment: (NOTE) The Xpert Xpress SARS-CoV-2/FLU/RSV assay is intended as an aid in  the diagnosis of influenza from Nasopharyngeal swab specimens and  should not be used as a sole basis for treatment. Nasal washings and  aspirates are unacceptable for Xpert Xpress SARS-CoV-2/FLU/RSV  testing. Fact Sheet for Patients: PinkCheek.be Fact Sheet for Healthcare Providers: GravelBags.it This test is not yet approved or cleared by the [place] FDA and  has been authorized for detection and/or diagnosis of SARS-CoV-2 by  FDA under an Emergency Use Authorization (EUA). This EUA will remain  in effect (meaning this test can be used) for  the duration of the  Covid-19 declaration under Section 564(b)(1) of the Act, 21  U.S.C. section 360bbb-3(b)(1), unless the authorization is  terminated or revoked. Performed at [facility], [place] [address], [place], [place] [address]   Culture, blood (routine x 2)     Status: None   Collection Time: 06/09/19 12:14 AM   Specimen: BLOOD  Result Value Ref Range Status   Specimen Description BLOOD SITE NOT SPECIFIED  Final   Special Requests   Final    BOTTLES DRAWN AEROBIC AND ANAEROBIC Blood Culture results may not be optimal due to an inadequate volume of blood received in culture bottles   Culture   Final    NO GROWTH 5 DAYS Performed at [facility], [place] [address], [place], [place] [address]    Report Status 06/14/2019 FINAL  Final         Radiology Studies: No results found.      Scheduled Meds: . acetaminophen  1,000 mg Oral Once  . ALPRAZolam  0.5 mg Oral TID  . diclofenac Sodium  2 g Topical QID  . feeding supplement (PRO-STAT SUGAR FREE 64)  30 mL Per Tube BID  . free water  400 mL Per Tube Q6H  . gabapentin  600 mg Per Tube QID  . glycopyrrolate  0.2 mg Intravenous TID  . mouth rinse  15 mL Mouth  Rinse BID  . metoprolol tartrate  12.5 mg Per Tube BID  . oxyCODONE  10 mg Oral TID  . pantoprazole sodium  40 mg Per Tube Daily  . sertraline  50 mg Per Tube Daily   Continuous Infusions: . sodium chloride    . dextrose 50 mL/hr at 06/17/19 1430  . dextrose 100 mL/hr at 06/18/19 0256  . feeding supplement (JEVITY 1.2 CAL) 75 mL/hr at 06/16/19 0829  . piperacillin-tazobactam (ZOSYN)  IV 3.375 g (06/18/19 0301)          Aline August, MD [facility] 06/18/2019, 7:40 AM

## 2019-06-19 LAB — CBC WITH DIFFERENTIAL/PLATELET
Abs Immature Granulocytes: 0.03 10*3/uL (ref 0.00–0.07)
Basophils Absolute: 0 10*3/uL (ref 0.0–0.1)
Basophils Relative: 0 %
Eosinophils Absolute: 0.4 10*3/uL (ref 0.0–0.5)
Eosinophils Relative: 6 %
HCT: 31.5 % — ABNORMAL LOW (ref 39.0–52.0)
Hemoglobin: 10 g/dL — ABNORMAL LOW (ref 13.0–17.0)
Immature Granulocytes: 1 %
Lymphocytes Relative: 15 %
Lymphs Abs: 0.9 10*3/uL (ref 0.7–4.0)
MCH: 29.7 pg (ref 26.0–34.0)
MCHC: 31.7 g/dL (ref 30.0–36.0)
MCV: 93.5 fL (ref 80.0–100.0)
Monocytes Absolute: 0.5 10*3/uL (ref 0.1–1.0)
Monocytes Relative: 9 %
Neutro Abs: 3.9 10*3/uL (ref 1.7–7.7)
Neutrophils Relative %: 69 %
Platelets: 257 10*3/uL (ref 150–400)
RBC: 3.37 MIL/uL — ABNORMAL LOW (ref 4.22–5.81)
RDW: 20.8 % — ABNORMAL HIGH (ref 11.5–15.5)
WBC: 5.7 10*3/uL (ref 4.0–10.5)
nRBC: 0 % (ref 0.0–0.2)

## 2019-06-19 LAB — MAGNESIUM: Magnesium: 1.9 mg/dL (ref 1.7–2.4)

## 2019-06-19 LAB — BASIC METABOLIC PANEL
Anion gap: 7 (ref 5–15)
BUN: 17 mg/dL (ref 8–23)
CO2: 26 mmol/L (ref 22–32)
Calcium: 8.1 mg/dL — ABNORMAL LOW (ref 8.9–10.3)
Chloride: 102 mmol/L (ref 98–111)
Creatinine, Ser: 0.55 mg/dL — ABNORMAL LOW (ref 0.61–1.24)
GFR calc Af Amer: >60 mL/min (ref >60)
GFR calc non Af Amer: >60 mL/min (ref >60)
Glucose, Bld: 103 mg/dL — ABNORMAL HIGH (ref 70–99)
Potassium: 4.3 mmol/L (ref 3.5–5.1)
Sodium: 135 mmol/L (ref 135–145)

## 2019-06-19 LAB — GLUCOSE, CAPILLARY
Glucose-Capillary: 100 mg/dL — ABNORMAL HIGH (ref 70–99)
Glucose-Capillary: 121 mg/dL — ABNORMAL HIGH (ref 70–99)
Glucose-Capillary: 131 mg/dL — ABNORMAL HIGH (ref 70–99)
Glucose-Capillary: 52 mg/dL — ABNORMAL LOW (ref 70–99)
Glucose-Capillary: 72 mg/dL (ref 70–99)
Glucose-Capillary: 80 mg/dL (ref 70–99)
Glucose-Capillary: 87 mg/dL (ref 70–99)
Glucose-Capillary: 97 mg/dL (ref 70–99)

## 2019-06-19 MED ORDER — DEXTROSE 50 % IV SOLN: 25.0000 g | INTRAVENOUS | Status: AC

## 2019-06-19 MED ORDER — DEXTROSE 50 % IV SOLN
INTRAVENOUS | Status: AC
  Administered 2019-06-19: 50 mL
  Filled 2019-06-19: qty 50

## 2019-06-19 MED ORDER — PRO-STAT SUGAR FREE PO LIQD
30.0000 mL | Freq: Every day | ORAL | Status: DC
  Administered 2019-06-20 – 2019-06-23 (×4): 30 mL
  Filled 2019-06-19 (×4): qty 30

## 2019-06-19 MED ORDER — JEVITY 1.5 CAL/FIBER PO LIQD
1000.0000 mL | ORAL | Status: DC
  Administered 2019-06-19 (×2): 1000 mL via ORAL
  Filled 2019-06-19 (×2): qty 1000

## 2019-06-19 MED ORDER — JEVITY 1.5 CAL/FIBER PO LIQD
1000.0000 mL | ORAL | Status: DC
  Administered 2019-06-20 – 2019-06-23 (×4): 1000 mL
  Filled 2019-06-19 (×8): qty 1000

## 2019-06-19 NOTE — Progress Notes (Signed)
I followed up with pt for an over the weekend consult. Pt was alert. I shared with him the contact I made on his behalf. I shared that I reached out to Kyle [name] about Kyle [name] wanting him to visit. Kyle [name] communicated that he wants a smart phone. He said that this is a means of communication he has with those he has been trying to connect with.   He requested that somehow the care team would get him a smart phone in order to text his contacts. He said that way they would know it was Kyle [name] trying to connect with them. He said they normally communicate that way. I told Kyle [name] that I would put his concerns on his chart for staff to review. I was available to offer Kyle [name] space to communicate concerns. I offered pastoral presence with prayer and continued support for him.   Palliative Care Chaplain resident  Kyle Levy MA [phone]

## 2019-06-19 NOTE — Progress Notes (Signed)
  Speech Language Pathology Treatment: Dysphagia  Patient Details Name: Kyle [name] MRN: [id number] DOB: 1956/02/22 Today's Date: 06/19/2019 Time: [id number] SLP Time Calculation (min) (ACUTE ONLY): 19 min  Assessment / Plan / Recommendation Clinical Impression  Pt participated in swallowing therapy with SLP this date.  Pt tolerated small teaspoon amounts of thin and nectar thick liquids, and puree without overt s/s of aspiration.  There were significant oral deficits noted.  Pt is unable to attain full oral/labial closure.  Lingual pumping noted prior to swallow. Anterior spillage noted with thin and nectar thick liquid from L side of oral cavity.  Pt also exhibits loss of secretions from L side of oral cavity as well. There was decreased anterior loss with nectar thick liquid as compared to thin.  Pt adjusted head of bed independently to partial recline (HOB at 47 degrees), to assist with passive oral transit of liquid.  He endorsed that this strategy was beneficial. Pt was able to feed himself small amounts of nectar thick liquid by teaspoon.  He consumed 10+ trials of nectar thick liquid with SLP without any cough response noted.  There was minimal oral transit of puree, and majority of bolus was removed with suction. Pt is eager to resume some sort of PO intake, although he will likely require PEG for nutritional support even if pt is protecting airway during pharyngeal phase of swallow . Recommend continued therapeutic bolus trials with SLP this week.  Consider MBSS later in the week to assess pharyngeal swallow function.  Pt has not yet had assessment of pharyngeal swallow function given severity of oral deficits.   HPI HPI: 64 y.o. male with history of atrial fibrillation, hypertension, COPD was recently admitted for acute CVA underwent mechanical thrombectomy for right MCA infarct with patient having dysphagia and difficulty speaking had to undergo PEG tube placement and was discharged to  rehab when patient signed out himself [place] and went home and try to eat when he aspirated and became short of breath and EMS was called.        SLP Plan  Continue with current plan of care;MBS       Recommendations  Diet recommendations: NPO                Oral Care Recommendations: Oral care QID Follow up Recommendations: Skilled Nursing facility SLP Visit Diagnosis: Dysphagia, oropharyngeal phase (R13.12) Plan: Continue with current plan of care;MBS       [place], [place], [place] Office: [phone]  06/19/2019, 12:00 PM

## 2019-06-19 NOTE — Progress Notes (Signed)
Nutrition Follow-up  DOCUMENTATION CODES:   Obesity unspecified  INTERVENTION:   -Switch formula to Jevity 1.5 @ 70 ml/hr via J-tube -30 ml Prostat daily -Free water flushes per MD, 200 ml every 6 hours (800 ml) -Provides 2620 kcals, 122g protein and 2076 ml H2O. Also, 362g of CHO daily  NUTRITION DIAGNOSIS:   Inadequate oral intake related to dysphagia as evidenced by NPO status.  GOAL:   Patient will meet greater than or equal to 90% of their needs  MONITOR:   TF tolerance, Labs, Weight trends, I & O's  REASON FOR ASSESSMENT:   Consult Assessment of nutrition requirement/status, Enteral/tube feeding initiation and management  ASSESSMENT:   Pt with history of atrial fibrillation, hypertension, COPD was recently admitted for acute CVA underwent mechanical thrombectomy for right MCA infarct with patient having dysphagia and difficulty speaking had to undergo PEG tube placement and was discharged to rehab when patient signed out himself [place] and went home and try to eat when he aspirated and became short of breath and EMS was called.   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  12/21: s/p J-tube placement, not G-tube. Per chart review, pt's stomach anatomy was not appropriate for G-tube placement.  12/24: admitted for acute respiratory failure with hypoxia 2/2 aspiration after pt attempted to eat  **RD working remotely**  RD consulted regarding pt having consistent hypoglycemic episodes despite continuous feedings of Jevity 1.2. Jevity 1.2 @ 75 ml/hr provides ~304g CHO daily. Will trial Jevity 1.5 which is more concentrated with more CHO to see if this improves blood sugars.   Admission weight: 302 lbs. Current weight: 345 lbs.  I/Os: +17.4L since admit UOP: 1.6L x 24 hrs  Medications: D5 infusion Labs reviewed: CBGs: 97-131  Diet Order:   Diet Order            Diet NPO time specified Except for:  Ice Chips  Diet effective now        Diet - low sodium heart healthy              EDUCATION NEEDS:   No education needs have been identified at this time  Skin:  Skin Assessment: Reviewed RN Assessment  Last BM:  1/4 -type 6  Height:   Ht Readings from Last 1 Encounters:  06/08/19 6\' 3"  (1.905 m)    Weight:   Wt Readings from Last 1 Encounters:  06/19/19 (!) 156.9 kg    Ideal Body Weight:  89.1 kg  BMI:  Body mass index is 43.23 kg/m.  Estimated Nutritional Needs:   Kcal:  2400-2600  Protein:  120-130g  Fluid:  >2L/day  Clayton Bibles, MS, RD, LDN Inpatient Clinical Dietitian Pager: [phone] After Hours Pager: [phone]

## 2019-06-19 NOTE — Progress Notes (Signed)
Hypoglycemic Event  CBG: 52 mg/dL  Treatment: D50 50 mL (25 gm)  Symptoms: None  Follow-up CBG: Time:03:10, 03;16 CBG Result:35 and 131 mg/dl  Possible Reasons for Event: Unknown  Comments/MD notified:[place] NP    Tad Moore

## 2019-06-19 NOTE — TOC Progression Note (Signed)
Transition of Care [facility]) - Progression Note    Patient Details  Name: Kyle [name] MRN: [id number] Date of Birth: 11/17/1955  Transition of Care [facility]) CM/SW Contact  Pollie Friar, RN Phone Number: 06/19/2019, 1:04 PM  Clinical Narrative:    CM went and talked with patient in the room. He is agreeable to SNF rehab as long as its not [facility]. He wants to have his cell phone prior to going to rehab. CM told him that's not a reason for him to stay. CM did reach out to his cousin and they are working to get his cell phone reprogrammed with his information. FL2 done and faxed out. TOC following.   Expected Discharge Plan: [place] Barriers to Discharge: Continued Medical Work up  Expected Discharge Plan and Services Expected Discharge Plan: [place] In-house Referral: Clinical Social Work Discharge Planning Services: CM Consult Post Acute Care Choice: [place] arrangements for the past 2 months: Single Family Home Expected Discharge Date: 06/09/19               DME Arranged: N/A         HH Arranged: OT, PT, RN [place] Agency: [place] Date [facility] Agency Contacted: 06/09/19 Time [place]: [place] Representative spoke with at [place]: [place] ([place]) Interventions    Readmission Risk Interventions No flowsheet data found.

## 2019-06-19 NOTE — NC FL2 (Signed)
[place] MEDICAID FL2 LEVEL OF CARE SCREENING TOOL     IDENTIFICATION  Patient Name: Kyle [name] Birthdate: Aug 11, 1955 Sex: male Admission Date (Current Location): 06/08/2019  [facility] and [place] Number:  Herbalist and Address:  [facility] [place], [place] [address] [place], [place] [address]      Provider Number: [id number]  Attending Physician Name and Address:  Aline August, MD  Relative Name and Phone Number:       Current Level of Care: Hospital Recommended Level of Care: [place] Prior Approval Number:    Date Approved/Denied:   PASRR Number: [id number]  Discharge Plan: SNF    Current Diagnoses: Patient Active Problem List   Diagnosis Date Noted  . Aspiration into airway 06/08/2019  . Aspiration pneumonia ([place])   . Dysphagia due to recent cerebrovascular accident 05/18/2019  . Tobacco use disorder 05/18/2019  . Chronic pain syndrome 05/18/2019  . Stroke ([place]) R MCA emb d/t AF not on [facility] s/p partial revascularization w/ IR 04/30/2019  . Middle cerebral artery embolism, right 04/30/2019  . Chronic atrial fibrillation ([place]) 08/26/2015  . Hyperlipidemia LDL goal <100 06/26/2014  . Routine general medical examination at a health care facility 11/21/2013  . Screening PSA (prostate specific antigen) 11/21/2013  . Special screening for malignant neoplasms, colon 11/21/2013  . OSA (obstructive sleep apnea) 08/22/2013  . Morbid obesity ([place]) 04/19/2013  . Other emphysema ([place]) 01/31/2013  . Essential hypertension 12/21/2012  . Insomnia 12/21/2012  . GERD (gastroesophageal reflux disease) 09/20/2012  . Back pain, chronic 09/20/2012  . Obesity hypoventilation syndrome ([place]) 09/20/2012  . Adjustment disorder with mixed anxiety and depressed mood 09/20/2012    Orientation RESPIRATION BLADDER Height & Weight     Self, Time, Situation, Place  Normal Incontinent Weight: (!) 345 lb 14.4 oz (156.9 kg) Height:  6\' 3"   (190.5 cm)  BEHAVIORAL SYMPTOMS/MOOD NEUROLOGICAL BOWEL NUTRITION STATUS      Incontinent Feeding tube  AMBULATORY STATUS COMMUNICATION OF NEEDS Skin   Extensive Assist Non-Verbally Surgical wounds(closed, abdomen; liquid skin adhesive)                       Personal Care Assistance Level of Assistance  Bathing, Feeding, Dressing Bathing Assistance: Maximum assistance Feeding assistance: Maximum assistance Dressing Assistance: Maximum assistance     Functional Limitations Info  Speech     Speech Info: Impaired(expressive aphasia, communicates by writing)    [place]  PT (By licensed PT), OT (By licensed OT), Speech therapy     PT Frequency: 5x/wk OT Frequency: 5x/wk     Speech Therapy Frequency: 5x/wk      Contractures Contractures Info: Not present    Additional Factors Info  Code Status, Allergies, Psychotropic Code Status Info: DNR Allergies Info: Vioxx Psychotropic Info: Zoloft 50mg  daily; Xanax 0.5mg  3x/day PRN         Current Medications (06/19/2019):  This is the current hospital active medication list Current Facility-Administered Medications  Medication Dose Route Frequency Provider Last Rate Last Admin  . 0.9 %  sodium chloride infusion   Intravenous Continuous Vann, Jessica U, DO      . acetaminophen (TYLENOL) tablet 650 mg  650 mg Oral Q6H PRN Rise Patience, MD   650 mg at 06/11/19 0011   Or  . acetaminophen (TYLENOL) suppository 650 mg  650 mg Rectal Q6H PRN Rise Patience, MD      . acetaminophen (TYLENOL) tablet  1,000 mg  1,000 mg Oral Once Duane Boston, MD      . albuterol (PROVENTIL) (2.5 MG/3ML) 0.083% nebulizer solution 2.5 mg  2.5 mg Nebulization Q4H PRN Rise Patience, MD   2.5 mg at 06/15/19 1116  . ALPRAZolam Duanne Moron) tablet 0.5 mg  0.5 mg Oral TID Lane Hacker L, DO   0.5 mg at 06/19/19 [id number]  . ALPRAZolam Duanne Moron) tablet 1 mg  1 mg Oral BID PRN Lane Hacker L, DO   1 mg at 06/19/19 0521   . cyclobenzaprine (FLEXERIL) tablet 10 mg  10 mg Oral TID PRN Donnamae Jude, MD   10 mg at 06/18/19 [id number]  . dextrose 5 % solution   Intravenous Continuous Aline August, MD 50 mL/hr at 06/17/19 1430 New Bag at 06/17/19 1430  . dextrose 5 % solution   Intravenous Continuous Aline August, MD 100 mL/hr at 06/19/19 0254 New Bag at 06/19/19 0254  . diclofenac Sodium (VOLTAREN) 1 % topical gel 2 g  2 g Topical QID Vann, Jessica U, DO   2 g at 06/19/19 0819  . feeding supplement (JEVITY 1.5 CAL/FIBER) liquid 1,000 mL  1,000 mL Oral Continuous Alekh, Kshitiz, MD      . Derrill Memo ON 06/20/2019] feeding supplement (PRO-STAT SUGAR FREE 64) liquid 30 mL  30 mL Per Tube Daily Alekh, Kshitiz, MD      . free water 200 mL  200 mL Per Tube Q6H Alekh, Kshitiz, MD   200 mL at 06/19/19 0544  . gabapentin (NEURONTIN) 250 MG/5ML solution 600 mg  600 mg Per Tube QID Rise Patience, MD   600 mg at 06/19/19 1018  . glycopyrrolate (ROBINUL) injection 0.2 mg  0.2 mg Intravenous TID Lane Hacker L, DO   0.2 mg at 06/19/19 [id number]  . HYDROmorphone (DILAUDID) injection 0.5 mg  0.5 mg Intravenous Q2H PRN Lane Hacker L, DO   0.5 mg at 06/19/19 0521  . MEDLINE mouth rinse  15 mL Mouth Rinse BID Aline August, MD   15 mL at 06/19/19 0823  . metoprolol tartrate (LOPRESSOR) 25 mg/10 mL oral suspension 12.5 mg  12.5 mg Per Tube BID Rise Patience, MD   12.5 mg at 06/19/19 1018  . nitroGLYCERIN (NITROSTAT) SL tablet 0.4 mg  0.4 mg Sublingual Q5 min PRN Eliseo Squires, Jessica U, DO      . ondansetron (ZOFRAN) tablet 4 mg  4 mg Oral Q6H PRN Rise Patience, MD       Or  . ondansetron [facility]) injection 4 mg  4 mg Intravenous Q6H PRN Rise Patience, MD      . oxyCODONE (Oxy IR/ROXICODONE) immediate release tablet 10 mg  10 mg Oral TID Lane Hacker L, DO   10 mg at 06/19/19 [id number]  . pantoprazole sodium (PROTONIX) 40 mg/20 mL oral suspension 40 mg  40 mg Per Tube Daily Rise Patience, MD   40 mg at  06/19/19 [id number]  . sertraline (ZOLOFT) tablet 50 mg  50 mg Per Tube Daily Emeterio Reeve, DO   50 mg at 06/19/19 [id number]     Discharge Medications: Please see discharge summary for a list of discharge medications.  Relevant Imaging Results:  Relevant Lab Results:   Additional Information SS#: [id number]  Geralynn Ochs, LCSW

## 2019-06-19 NOTE — Progress Notes (Signed)
Patient ID: Kyle [name], male   DOB: [birth year], 64 y.o.   MRN: [name]  PROGRESS NOTE    Kyle [name]  [id number] DOB: 06-23-1955 DOA: 06/08/2019 PCP: [name], MD   Brief Narrative:  64 year old male with history of persistent A. fib, hypertension, COPD, recent admission for acute CVA requiring mechanical thrombectomy for right MCA infarct with subsequent dysphagia/difficulty speaking for which she required PEG tube placement and was discharged to rehab but subsequently signed out [place] and went home and tried to eat when he subsequently aspirated, became more short of breath and presented to the ED.  Was started on intravenous antibiotics.  Subsequently, he was also found to have large right thigh hematoma for which orthopedics recommended conservative management with IV antibiotics and H&H with no need for surgical intervention.  Assessment & Plan:   Large right thigh hematoma -MRI showed: Large intramuscular hematoma in the anterior and medial right upper leg.  Superinfection of the hematoma is possible -Lovenox held. -Monitor H&H.  Hemoglobin pending this morning. -Prior hospitalist had spoken with orthopedics on phone who recommended conservative management with IV antibiotics, H&H monitoring and no need for current surgical intervention.  Zosyn was discontinued on 06/18/2019.  Acute respiratory failure with hypoxia secondary to aspiration into the airway History of dysphagia from recent stroke status post PEG tube placement -Patient had aspiration into airway after attempting to eat at home after eloping from SNF -Continue antibiotics. -SLP has evaluated the patient and still recommend n.p.o. -Currently on room air -Continue PEG tube feeding for now. Currently unsafe to discharge home.   Patient would be appropriate for SNF placement.  Hypoglycemia -Has intermittent persistent hypoglycemia despite being on tube feedings.  Continue D5 at 100 cc an  hour.  Will request dietary evaluation if tube feedings can be changed.  Hypernatremia -Improved.  Continue free water through PEG tube  Leukocytosis -Resolved  Persistent A. fib -Currently rate controlled.  Continue metoprolol -Lovenox on hold secondary to hematoma.  Was recently discharged to SNF on Lovenox  Recent thrombotic stroke -Status post thrombectomy during recent admission for stroke.  Still with communication issues and dysphagia  Iron deficiency anemia -Monitor hemoglobin.  Transfuse if hemoglobin is less than 7  Right knee pain -X-ray without fracture.  Continue Voltaren gel  Chronic pain -Continue home medications for pain.  Morbid obesity -Outpatient follow-up  Generalized deconditioning -PT recommending SNF.  Patient is currently refusing SNF. -Palliative Care consult appreciated.  CODE STATUS has been changed to DNR.  DVT prophylaxis: Lovenox on hold due to hematoma  code Status: DNR Family Communication: None at bedside Disposition Plan: SNF if patient is agreeable. Consultants: Palliative care   Procedures: None  Antimicrobials:  Anti-infectives (From admission, onward)   Start     Dose/Rate Route Frequency Ordered Stop   06/13/19 1200  piperacillin-tazobactam (ZOSYN) IVPB 3.375 g  Status:  Discontinued     3.375 g 12.5 mL/hr over 240 Minutes Intravenous Every 8 hours 06/13/19 1000 06/18/19 0930   06/09/19 1645  levofloxacin (LEVAQUIN) tablet 500 mg  Status:  Discontinued     500 mg Per J Tube Daily 06/09/19 1640 06/09/19 1641   06/09/19 1645  levofloxacin (LEVAQUIN) tablet 500 mg  Status:  Discontinued     500 mg Per J Tube Daily 06/09/19 1642 06/09/19 1934   06/09/19 0000  Ampicillin-Sulbactam (UNASYN) 3 g in sodium chloride 0.9 % 100 mL IVPB  Status:  Discontinued     3 g 200  mL/hr over 30 Minutes Intravenous Every 6 hours 06/08/19 2340 06/13/19 1000   06/09/19 0000  levofloxacin (LEVAQUIN) 500 MG tablet  Status:  Discontinued     500 mg Per  J Tube Daily 06/09/19 1442 06/09/19    06/09/19 0000  levofloxacin (LEVAQUIN) 500 MG tablet     500 mg Per J Tube Daily 06/09/19 1621 06/14/19 2359   06/08/19 1800  Ampicillin-Sulbactam (UNASYN) 3 g in sodium chloride 0.9 % 100 mL IVPB     3 g 200 mL/hr over 30 Minutes Intravenous  Once 06/08/19 1754 06/08/19 1856       Subjective: Patient seen and examined at bedside.  Poor historian.  No overnight fever or vomiting reported.  Nursing staff still reports intermittent hypoglycemia. Objective: Vitals:   06/18/19 0846 06/18/19 1644 06/18/19 2101 06/19/19 0233  BP: 114/73 101/66 (!) 105/56   Pulse: 66 63 (!) 53   Resp: 16 16 18    Temp: 97.9 F (36.6 C) 97.6 F (36.4 C) 97.8 F (36.6 C)   TempSrc: Oral Oral Oral   SpO2: 100% 100% 98%   Weight:    (!) 156.9 kg  Height:        Intake/Output Summary (Last 24 hours) at 06/19/2019 0733 Last data filed at 06/19/2019 0700 Gross per 24 hour  Intake 4528.15 ml  Output 1350 ml  Net 3178.15 ml   Filed Weights   06/17/19 0434 06/18/19 0257 06/19/19 0233  Weight: (!) 156.5 kg (!) 159.4 kg (!) 156.9 kg    Examination:  General exam: No distress.  Looks chronically ill.  Looks older than stated age.  Poor historian.  Sleepy, wakes up slightly Respiratory system: Bilateral decreased breath sounds at bases with some crackles  cardiovascular system: Rate controlled, S1-S2 heard Gastrointestinal system: Abdomen is morbidly obese, nondistended, soft and nontender.  Bowel sounds heard.  PEG tube present. Extremities: No cyanosis; right thigh only minimally tender.  Trace lower extremity edema present   Data Reviewed: I have personally reviewed following labs and imaging studies  CBC: Recent Labs  Lab 06/14/19 0442 06/15/19 0347 06/16/19 0422 06/17/19 0235 06/18/19 0711  WBC 13.9* 9.8 9.3 9.2 8.5  NEUTROABS  --  7.1 6.5 6.6 6.4  HGB 8.6* 8.5* 9.0* 8.6* 8.8*  HCT 28.9* 27.7* 30.2* 28.5* 28.0*  MCV 98.0 96.2 98.1 97.6 94.3  PLT 293  297 315 307 0000000   Basic Metabolic Panel: Recent Labs  Lab 06/14/19 0442 06/15/19 0347 06/16/19 0422 06/17/19 0235 06/18/19 0711  NA 147* 148* 143 138 132*  K 3.8 3.9 4.0 3.7 3.8  CL 114* 115* 111 105 100  CO2 26 25 25 26 25   GLUCOSE 94 96 98 108* 121*  BUN 26* 26* 27* 25* 20  CREATININE 0.67 0.60* 0.73 0.59* 0.62  CALCIUM 8.2* 8.4* 8.3* 8.1* 8.0*  MG  --  2.4 2.1 1.9 1.8   GFR: Estimated Creatinine Clearance: 155.7 mL/min (by C-G formula based on SCr of 0.62 mg/dL). Liver Function Tests: No results for input(s): AST, ALT, ALKPHOS, BILITOT, PROT, ALBUMIN in the last 168 hours. No results for input(s): LIPASE, AMYLASE in the last 168 hours. No results for input(s): AMMONIA in the last 168 hours. Coagulation Profile: No results for input(s): INR, PROTIME in the last 168 hours. Cardiac Enzymes: No results for input(s): CKTOTAL, CKMB, CKMBINDEX, TROPONINI in the last 168 hours. BNP (last 3 results) No results for input(s): PROBNP in the last 8760 hours. HbA1C: No results for input(s): HGBA1C in the  last 72 hours. CBG: Recent Labs  Lab 06/18/19 1951 06/18/19 2358 06/19/19 0243 06/19/19 0310 06/19/19 0316  GLUCAP 74 87 52* 35* 131*   Lipid Profile: No results for input(s): CHOL, HDL, LDLCALC, TRIG, CHOLHDL, LDLDIRECT in the last 72 hours. Thyroid Function Tests: No results for input(s): TSH, T4TOTAL, FREET4, T3FREE, THYROIDAB in the last 72 hours. Anemia Panel: No results for input(s): VITAMINB12, FOLATE, FERRITIN, TIBC, IRON, RETICCTPCT in the last 72 hours. Sepsis Labs: No results for input(s): PROCALCITON, LATICACIDVEN in the last 168 hours.  No results found for this or any previous visit (from the past 240 hour(s)).       Radiology Studies: No results found.      Scheduled Meds: . acetaminophen  1,000 mg Oral Once  . ALPRAZolam  0.5 mg Oral TID  . diclofenac Sodium  2 g Topical QID  . feeding supplement (PRO-STAT SUGAR FREE 64)  30 mL Per Tube BID   . free water  200 mL Per Tube Q6H  . gabapentin  600 mg Per Tube QID  . glycopyrrolate  0.2 mg Intravenous TID  . mouth rinse  15 mL Mouth Rinse BID  . metoprolol tartrate  12.5 mg Per Tube BID  . oxyCODONE  10 mg Oral TID  . pantoprazole sodium  40 mg Per Tube Daily  . sertraline  50 mg Per Tube Daily   Continuous Infusions: . sodium chloride    . dextrose 50 mL/hr at 06/17/19 1430  . dextrose 100 mL/hr at 06/19/19 0254  . feeding supplement (JEVITY 1.2 CAL) 75 mL/hr at 06/16/19 0829          Aline August, MD [facility] 06/19/2019, 7:33 AM

## 2019-06-20 ENCOUNTER — Telehealth: Payer: Self-pay

## 2019-06-20 LAB — GLUCOSE, CAPILLARY
Glucose-Capillary: 100 mg/dL — ABNORMAL HIGH (ref 70–99)
Glucose-Capillary: 102 mg/dL — ABNORMAL HIGH (ref 70–99)
Glucose-Capillary: 79 mg/dL (ref 70–99)
Glucose-Capillary: 81 mg/dL (ref 70–99)
Glucose-Capillary: 86 mg/dL (ref 70–99)
Glucose-Capillary: 93 mg/dL (ref 70–99)
Glucose-Capillary: 96 mg/dL (ref 70–99)
Glucose-Capillary: <10 mg/dL — CL (ref 70–99)

## 2019-06-20 NOTE — Care Management Important Message (Signed)
Important Message  Patient Details  Name: Kyle [name] MRN: [id number] Date of Birth: [birth year]   Medicare Important Message Given:  Yes     Orbie Pyo 06/20/2019, 3:14 PM

## 2019-06-20 NOTE — Telephone Encounter (Signed)
Documentation reviewed to follow up regarding Palliative Care referral. Noted that patient remains in the hospital. Will continue to follow.

## 2019-06-20 NOTE — Progress Notes (Signed)
Patient ID: CORDERO DIROSA, male   DOB: February 09, 1956, 64 y.o.   MRN: [id number]  PROGRESS NOTE    GADSDEN CAEZ  [id number] DOB: 1955-12-02 DOA: 06/08/2019 PCP: [name], MD   Brief Narrative:  64 year old male with history of persistent A. fib, hypertension, COPD, recent admission for acute CVA requiring mechanical thrombectomy for right MCA infarct with subsequent dysphagia/difficulty speaking for which she required PEG tube placement and was discharged to rehab but subsequently signed out [place] and went home and tried to eat when he subsequently aspirated, became more short of breath and presented to the ED.  Was started on intravenous antibiotics.  Subsequently, he was also found to have large right thigh hematoma for which orthopedics recommended conservative management with IV antibiotics and H&H with no need for surgical intervention.  Assessment & Plan:   Large right thigh hematoma -MRI showed: Large intramuscular hematoma in the anterior and medial right upper leg.  Superinfection of the hematoma is possible -Lovenox held. -Monitor H&H.  Hemoglobin pending this morning. -Prior hospitalist had spoken with orthopedics on phone who recommended conservative management with IV antibiotics, H&H monitoring and no need for current surgical intervention.  Zosyn was discontinued on 06/18/2019.  Acute respiratory failure with hypoxia secondary to aspiration into the airway History of dysphagia from recent stroke status post PEG tube placement -Patient had aspiration into airway after attempting to eat at home after eloping from SNF -Continue antibiotics. -SLP has evaluated the patient and still recommend n.p.o. -Currently on room air -Continue PEG tube feeding for now. Currently unsafe to discharge home.   Patient would be appropriate for SNF placement.  Hypoglycemia -Has intermittent persistent hypoglycemia despite being on tube feedings.  Tube feedings were changed on  06/19/2019 by dietary.  Blood sugars still on the lower side but improving.  Change D5 to 50 cc an hour.  Hypernatremia -Improved.  Continue free water through PEG tube  Leukocytosis -Resolved  Persistent A. fib -Currently rate controlled.  Continue metoprolol -Lovenox on hold secondary to hematoma.  Was recently discharged to SNF on Lovenox.  Resume Lovenox tomorrow if hemoglobin remains stable.  Recent thrombotic stroke -Status post thrombectomy during recent admission for stroke.  Still with communication issues and dysphagia  Iron deficiency anemia -Monitor hemoglobin.  Transfuse if hemoglobin is less than 7  Right knee pain -X-ray without fracture.  Continue Voltaren gel  Chronic pain -Continue home medications for pain.  Morbid obesity -Outpatient follow-up  Generalized deconditioning -PT recommending SNF.  Patient is agreeable for SNF.  Social worker following. -Palliative Care consult appreciated.  CODE STATUS has been changed to DNR.  DVT prophylaxis: Lovenox on hold due to hematoma  code Status: DNR Family Communication: None at bedside Disposition Plan: SNF once bed is available. Consultants: Palliative care   Procedures: None  Antimicrobials:  Anti-infectives (From admission, onward)   Start     Dose/Rate Route Frequency Ordered Stop   06/13/19 1200  piperacillin-tazobactam (ZOSYN) IVPB 3.375 g  Status:  Discontinued     3.375 g 12.5 mL/hr over 240 Minutes Intravenous Every 8 hours 06/13/19 1000 06/18/19 0930   06/09/19 1645  levofloxacin (LEVAQUIN) tablet 500 mg  Status:  Discontinued     500 mg Per J Tube Daily 06/09/19 1640 06/09/19 1641   06/09/19 1645  levofloxacin (LEVAQUIN) tablet 500 mg  Status:  Discontinued     500 mg Per J Tube Daily 06/09/19 1642 06/09/19 1934   06/09/19 0000  Ampicillin-Sulbactam (UNASYN) 3  g in sodium chloride 0.9 % 100 mL IVPB  Status:  Discontinued     3 g 200 mL/hr over 30 Minutes Intravenous Every 6 hours 06/08/19 2340  06/13/19 1000   06/09/19 0000  levofloxacin (LEVAQUIN) 500 MG tablet  Status:  Discontinued     500 mg Per J Tube Daily 06/09/19 1442 06/09/19    06/09/19 0000  levofloxacin (LEVAQUIN) 500 MG tablet     500 mg Per J Tube Daily 06/09/19 1621 06/14/19 2359   06/08/19 1800  Ampicillin-Sulbactam (UNASYN) 3 g in sodium chloride 0.9 % 100 mL IVPB     3 g 200 mL/hr over 30 Minutes Intravenous  Once 06/08/19 1754 06/08/19 1856       Subjective: Patient seen and examined at bedside.  Poor historian.  Nods his head to some questions.  No overnight fever or vomiting reported. Objective: Vitals:   06/19/19 0743 06/19/19 1736 06/19/19 1941 06/20/19 0457  BP: 111/69 (!) 113/49 (!) 109/50   Pulse: 68 68 64   Resp: 18 16    Temp: 97.7 F (36.5 C) 97.6 F (36.4 C) 97.9 F (36.6 C)   TempSrc: Oral  Oral   SpO2: 99% 100% 97%   Weight:    (!) 160.7 kg  Height:        Intake/Output Summary (Last 24 hours) at 06/20/2019 0739 Last data filed at 06/20/2019 0000 Gross per 24 hour  Intake 2093.42 ml  Output 3000 ml  Net -906.58 ml   Filed Weights   06/18/19 0257 06/19/19 0233 06/20/19 0457  Weight: (!) 159.4 kg (!) 156.9 kg (!) 160.7 kg    Examination:  General exam: No acute distress.  Looks chronically ill.  Looks older than stated age.  Poor historian.  Nods his head to some questions. Respiratory system: Bilateral decreased breath sounds at bases with scattered crackles.   Cardiovascular system: S1-S2 heard, rate controlled Gastrointestinal system: Abdomen is morbidly obese, nondistended, soft and nontender.  Bowel sounds heard.  PEG tube present. Extremities: No cyanosis; right thigh nontender.  Trace lower extremity edema present   Data Reviewed: I have personally reviewed following labs and imaging studies  CBC: Recent Labs  Lab 06/15/19 0347 06/16/19 0422 06/17/19 0235 06/18/19 0711 06/19/19 0931  WBC 9.8 9.3 9.2 8.5 5.7  NEUTROABS 7.1 6.5 6.6 6.4 3.9  HGB 8.5* 9.0* 8.6* 8.8*  10.0*  HCT 27.7* 30.2* 28.5* 28.0* 31.5*  MCV 96.2 98.1 97.6 94.3 93.5  PLT 297 315 307 296 [id number]   Basic Metabolic Panel: Recent Labs  Lab 06/15/19 0347 06/16/19 0422 06/17/19 0235 06/18/19 0711 06/19/19 0931  NA 148* 143 138 132* 135  K 3.9 4.0 3.7 3.8 4.3  CL 115* 111 105 100 102  CO2 25 25 26 25 26   GLUCOSE 96 98 108* 121* 103*  BUN 26* 27* 25* 20 17  CREATININE 0.60* 0.73 0.59* 0.62 0.55*  CALCIUM 8.4* 8.3* 8.1* 8.0* 8.1*  MG 2.4 2.1 1.9 1.8 1.9   GFR: Estimated Creatinine Clearance: 157.7 mL/min (A) (by C-G formula based on SCr of 0.55 mg/dL (L)). Liver Function Tests: No results for input(s): AST, ALT, ALKPHOS, BILITOT, PROT, ALBUMIN in the last 168 hours. No results for input(s): LIPASE, AMYLASE in the last 168 hours. No results for input(s): AMMONIA in the last 168 hours. Coagulation Profile: No results for input(s): INR, PROTIME in the last 168 hours. Cardiac Enzymes: No results for input(s): CKTOTAL, CKMB, CKMBINDEX, TROPONINI in the last 168 hours. BNP (last  3 results) No results for input(s): PROBNP in the last 8760 hours. HbA1C: No results for input(s): HGBA1C in the last 72 hours. CBG: Recent Labs  Lab 06/19/19 1730 06/19/19 2021 06/20/19 0009 06/20/19 0332 06/20/19 0443  GLUCAP 80 100* 100* 81 96   Lipid Profile: No results for input(s): CHOL, HDL, LDLCALC, TRIG, CHOLHDL, LDLDIRECT in the last 72 hours. Thyroid Function Tests: No results for input(s): TSH, T4TOTAL, FREET4, T3FREE, THYROIDAB in the last 72 hours. Anemia Panel: No results for input(s): VITAMINB12, FOLATE, FERRITIN, TIBC, IRON, RETICCTPCT in the last 72 hours. Sepsis Labs: No results for input(s): PROCALCITON, LATICACIDVEN in the last 168 hours.  No results found for this or any previous visit (from the past 240 hour(s)).       Radiology Studies: No results found.      Scheduled Meds: . acetaminophen  1,000 mg Oral Once  . ALPRAZolam  0.5 mg Oral TID  . diclofenac  Sodium  2 g Topical QID  . feeding supplement (PRO-STAT SUGAR FREE 64)  30 mL Per Tube Daily  . free water  200 mL Per Tube Q6H  . gabapentin  600 mg Per Tube QID  . glycopyrrolate  0.2 mg Intravenous TID  . mouth rinse  15 mL Mouth Rinse BID  . metoprolol tartrate  12.5 mg Per Tube BID  . oxyCODONE  10 mg Oral TID  . pantoprazole sodium  40 mg Per Tube Daily  . sertraline  50 mg Per Tube Daily   Continuous Infusions: . sodium chloride    . dextrose 100 mL/hr at 06/20/19 0446  . feeding supplement (JEVITY 1.5 CAL/FIBER) 1,000 mL (06/20/19 0455)          Aline August, MD [facility] 06/20/2019, 7:39 AM

## 2019-06-20 NOTE — TOC Progression Note (Signed)
Transition of Care [facility]) - Progression Note    Patient Details  Name: Kyle [name] MRN: [id number] Date of Birth: March 25, 1956  [facility] CM/SW Contact  Pollie Friar, RN Phone Number: 06/20/2019, 4:20 PM  Clinical Narrative:    [facility] has offered a bed and patient is in agreement. [facility] has started insurance authorization and CM has requested new covid test. [facility] states they will have a bed tomorrow.  Pt asking for PJ's for rehab. CM reached out to his cousin and they will get them for the patient. TOC following.   Expected Discharge Plan: [place] Barriers to Discharge: Continued Medical Work up  Expected Discharge Plan and Services Expected Discharge Plan: [place] In-house Referral: Clinical Social Work Discharge Planning Services: CM Consult Post Acute Care Choice: [place] arrangements for the past 2 months: Single Family Home Expected Discharge Date: 06/09/19               DME Arranged: N/A         HH Arranged: OT, PT, RN [place] Agency: [place] Date [facility] Agency Contacted: 06/09/19 Time [place]: [place] Representative spoke with at [place]: [place] ([place]) Interventions    Readmission Risk Interventions No flowsheet data found.

## 2019-06-20 NOTE — Progress Notes (Signed)
Physical Therapy Treatment Patient Details Name: Kyle [name] MRN: [place]:[id number] DOB: 02-18-1956 Today's Date: 06/20/2019    History of Present Illness 64 y.o. male with history of atrial fibrillation, hypertension, COPD was recently admitted for acute CVA underwent mechanical thrombectomy for right MCA infarct with patient having dysphagia and difficulty speaking had to undergo PEG tube placement and was discharged to rehab when patient signed out himself [place] and went home and try to eat when he aspirated and became short of breath and EMS was called.      PT Comments    Pt OOB mobility continues to be limited by R knee pain and L LE weakness. Attempted x3 today to complete standing however unable to achieve full upright standing with maxAx2. Pt easily frustrated and becomes agitated in regard to limited mobility and inability to eat due to aspiration precautions.  Acute PT to cont to follow.   Follow Up Recommendations  SNF;Supervision/Assistance - 24 hour     Equipment Recommendations  Wheelchair (measurements PT);Wheelchair cushion (measurements PT);Hospital bed;Rolling walker with 5" wheels    Recommendations for Other Services Rehab consult     Precautions / Restrictions Precautions Precautions: Fall Precaution Comments: R groin, pt found to have large hematoma) and knee pain Restrictions Weight Bearing Restrictions: No    Mobility  Bed Mobility Overal bed mobility: Needs Assistance Bed Mobility: Supine to Sit;Sit to Supine     Supine to sit: Min assist;HOB elevated Sit to supine: Min assist   General bed mobility comments: minA for R LE management off EOB, increased time however pt able to use bed rails to pull trunk up to EOB, pt able to bring LEs back into bed and push self up to [facility] with LEs and pull on head board with UEs  Transfers Overall transfer level: Needs assistance Equipment used: Rolling walker (2 wheeled) Transfers: Sit to/from  Stand Sit to Stand: Mod assist;Max assist;+2 physical assistance         General transfer comment: attempted sit to stand x3 however unable to achieve full upright standing either trial. Pt with R knee pain and L LE weakness  Ambulation/Gait                 Stairs             Wheelchair Mobility    Modified Rankin (Stroke Patients Only) Modified Rankin (Stroke Patients Only) Pre-Morbid Rankin Score: No significant disability Modified Rankin: Severe disability     Balance Overall balance assessment: Needs assistance Sitting-balance support: Feet supported;No upper extremity supported Sitting balance-Leahy Scale: Fair     Standing balance support: Bilateral upper extremity supported Standing balance-Leahy Scale: Zero Standing balance comment: dependent on physical assist                            Cognition Arousal/Alertness: Awake/alert Behavior During Therapy: WFL for tasks assessed/performed Overall Cognitive Status: Impaired/Different from baseline Area of Impairment: Safety/judgement;Problem solving;Awareness                       Following Commands: Follows one step commands with increased time Safety/Judgement: Decreased awareness of safety Awareness: Emergent   General Comments: pt easily frustrated and agitated due to functional limitations and inability to eat due to aspiration precautions      Exercises General Exercises - Lower Extremity Ankle Circles/Pumps: Both;10 reps;AROM;Seated Quad Sets: AROM;Both;10 reps Long Arc Quad: Left;10 reps;Supine Heel Slides: AROM;Both;10  reps    General Comments General comments (skin integrity, edema, etc.): VSS, R LE pain and hemotoma in groin      Pertinent Vitals/Pain Pain Assessment: Faces Faces Pain Scale: Hurts even more Pain Location: R knee and groin pain Pain Descriptors / Indicators: Grimacing;Guarding Pain Intervention(s): Limited activity within patient's tolerance     Home Living                      Prior Function            PT Goals (current goals can now be found in the care plan section) Progress towards PT goals: Progressing toward goals    Frequency    Min 3X/week      PT Plan Current plan remains appropriate    Co-evaluation              AM-PAC PT "6 Clicks" Mobility   Outcome Measure  Help needed turning from your back to your side while in a flat bed without using bedrails?: A Little Help needed moving from lying on your back to sitting on the side of a flat bed without using bedrails?: A Little Help needed moving to and from a bed to a chair (including a wheelchair)?: A Lot Help needed standing up from a chair using your arms (e.g., wheelchair or bedside chair)?: A Lot Help needed to walk in hospital room?: A Lot Help needed climbing 3-5 steps with a railing? : Total 6 Click Score: 13    End of Session Equipment Utilized During Treatment: Gait belt Activity Tolerance: Patient tolerated treatment well Patient left: in bed;with call bell/phone within reach;with bed alarm set Nurse Communication: Mobility status;Other (comment) PT Visit Diagnosis: Difficulty in walking, not elsewhere classified (R26.2);Muscle weakness (generalized) (M62.81)     Time: 1122-1202 PT Time Calculation (min) (ACUTE ONLY): 40 min  Charges:  $Therapeutic Exercise: 8-22 mins $Therapeutic Activity: 23-37 mins                     Kyle [name], PT, DPT Acute Rehabilitation Services Pager #: [phone] Office #: [phone]    Kyle [name] 06/20/2019, 2:18 PM

## 2019-06-21 DIAGNOSIS — E870 Hyperosmolality and hypernatremia: Secondary | ICD-10-CM

## 2019-06-21 DIAGNOSIS — T17908D Unspecified foreign body in respiratory tract, part unspecified causing other injury, subsequent encounter: Secondary | ICD-10-CM

## 2019-06-21 DIAGNOSIS — M25561 Pain in right knee: Secondary | ICD-10-CM

## 2019-06-21 DIAGNOSIS — S8011XD Contusion of right lower leg, subsequent encounter: Secondary | ICD-10-CM

## 2019-06-21 LAB — CBC WITH DIFFERENTIAL/PLATELET
Abs Immature Granulocytes: 0.03 10*3/uL (ref 0.00–0.07)
Basophils Absolute: 0 10*3/uL (ref 0.0–0.1)
Basophils Relative: 0 %
Eosinophils Absolute: 0.3 10*3/uL (ref 0.0–0.5)
Eosinophils Relative: 6 %
HCT: 28.8 % — ABNORMAL LOW (ref 39.0–52.0)
Hemoglobin: 9.1 g/dL — ABNORMAL LOW (ref 13.0–17.0)
Immature Granulocytes: 1 %
Lymphocytes Relative: 22 %
Lymphs Abs: 1.3 10*3/uL (ref 0.7–4.0)
MCH: 29.9 pg (ref 26.0–34.0)
MCHC: 31.6 g/dL (ref 30.0–36.0)
MCV: 94.7 fL (ref 80.0–100.0)
Monocytes Absolute: 0.7 10*3/uL (ref 0.1–1.0)
Monocytes Relative: 11 %
Neutro Abs: 3.7 10*3/uL (ref 1.7–7.7)
Neutrophils Relative %: 60 %
Platelets: 291 10*3/uL (ref 150–400)
RBC: 3.04 MIL/uL — ABNORMAL LOW (ref 4.22–5.81)
RDW: 20.6 % — ABNORMAL HIGH (ref 11.5–15.5)
WBC: 6 10*3/uL (ref 4.0–10.5)
nRBC: 0 % (ref 0.0–0.2)

## 2019-06-21 LAB — GLUCOSE, CAPILLARY
Glucose-Capillary: 35 mg/dL — CL (ref 70–99)
Glucose-Capillary: 118 mg/dL — ABNORMAL HIGH (ref 70–99)
Glucose-Capillary: 87 mg/dL (ref 70–99)
Glucose-Capillary: 88 mg/dL (ref 70–99)
Glucose-Capillary: 89 mg/dL (ref 70–99)
Glucose-Capillary: 101 mg/dL — ABNORMAL HIGH (ref 70–99)
Glucose-Capillary: 103 mg/dL — ABNORMAL HIGH (ref 70–99)

## 2019-06-21 LAB — BASIC METABOLIC PANEL
Anion gap: 9 (ref 5–15)
BUN: 16 mg/dL (ref 8–23)
CO2: 26 mmol/L (ref 22–32)
Calcium: 8.3 mg/dL — ABNORMAL LOW (ref 8.9–10.3)
Chloride: 100 mmol/L (ref 98–111)
Creatinine, Ser: 0.63 mg/dL (ref 0.61–1.24)
GFR calc Af Amer: >60 mL/min (ref >60)
GFR calc non Af Amer: >60 mL/min (ref >60)
Glucose, Bld: 113 mg/dL — ABNORMAL HIGH (ref 70–99)
Potassium: 3.7 mmol/L (ref 3.5–5.1)
Sodium: 135 mmol/L (ref 135–145)

## 2019-06-21 LAB — SARS CORONAVIRUS 2 (TAT 6-24 HRS): SARS Coronavirus 2: NEGATIVE

## 2019-06-21 LAB — HEPARIN LEVEL (UNFRACTIONATED): Heparin Unfractionated: <0.1 IU/mL — ABNORMAL LOW (ref 0.30–0.70)

## 2019-06-21 LAB — MAGNESIUM: Magnesium: 1.8 mg/dL (ref 1.7–2.4)

## 2019-06-21 MED ORDER — HEPARIN (PORCINE) 25000 UT/250ML-% IV SOLN
2300.0000 [IU]/h | INTRAVENOUS | Status: DC
  Administered 2019-06-21: 1400 [IU]/h via INTRAVENOUS
  Administered 2019-06-22: 2100 [IU]/h via INTRAVENOUS
  Administered 2019-06-22: 2300 [IU]/h via INTRAVENOUS
  Filled 2019-06-21 (×3): qty 250

## 2019-06-21 NOTE — Progress Notes (Signed)
  Speech Language Pathology Treatment: Dysphagia  Patient Details Name: Kyle [name] MRN: [place]:[id number] DOB: [birth year] Today's Date: 06/21/2019 Time: [id number] SLP Time Calculation (min) (ACUTE ONLY): 31 min  Assessment / Plan / Recommendation Clinical Impression  Therapy focused on dysphagia treatment working toward appropriate recommendation of MBS. Today oral mobility and bolus transfer was reduced compared to session 1/4 where he was able to propel 10+ boluses nectar thick versus 2 today with significant delays. Prior to bolus trials this therapist removed mucous bolus that covered the entirety of hard palate. Mouth is constantly open due to cranial nerve VII and mandibular section of trigeminal nerve V drying mucosa in addition to xerostomia with NPO status. Pt could not propel ice chip but nectar thick trials swallowed once reached posterior position to initiate swallow. No overt s/s aspiration although amount transferred was incomplete and small. Per this session an MBS would not be functional. After oral care, pt should have single ice chips very 3-4 hours to moisten mucosa. ST will work with pt while her and recommend at discharge.    HPI HPI: 64 y.o. male with history of atrial fibrillation, hypertension, COPD was recently admitted for acute CVA underwent mechanical thrombectomy for right MCA infarct with patient having dysphagia and difficulty speaking had to undergo PEG tube placement and was discharged to rehab when patient signed out himself Kyle [name] and went home and try to eat when he aspirated and became short of breath and EMS was called.        SLP Plan  Continue with current plan of care       Recommendations  Diet recommendations: NPO;Other(comment)(ice chips) Medication Administration: Via alternative means                Oral Care Recommendations: Oral care QID Follow up Recommendations: Skilled Nursing facility SLP Visit Diagnosis: Dysphagia,  oropharyngeal phase (R13.12) Plan: Continue with current plan of care       GO                Kyle [name] 06/21/2019, 1:13 PM

## 2019-06-21 NOTE — Plan of Care (Signed)

## 2019-06-21 NOTE — Progress Notes (Signed)
ANTICOAGULATION CONSULT NOTE - Initial Consult  Pharmacy Consult for Heparin Indication: atrial fibrillation and recent thrombotic CVA  Allergies  Allergen Reactions  . Vioxx [Rofecoxib] Rash    Patient Measurements: Height: 6\' 3"  (190.5 cm) Weight: (!) 347 lb 14.2 oz (157.8 kg) IBW/kg (Calculated) : 84.5 Heparin Dosing Weight: 121 kg  Vital Signs: Temp: 98.2 F (36.8 C) (01/06 0843) Temp Source: Oral (01/06 0843) BP: 110/43 (01/06 0843) Pulse Rate: 73 (01/06 0843)  Labs: Recent Labs    06/19/19 0931 06/21/19 0256  HGB 10.0* 9.1*  HCT 31.5* 28.8*  PLT 257 291  CREATININE 0.55* 0.63    Estimated Creatinine Clearance: 156.1 mL/min (by C-G formula based on SCr of 0.63 mg/dL).   Medical History: Past Medical History:  Diagnosis Date  . Arthritis   . Atrial fibrillation    HX OF SICK SINUS SYNDROME-ATRIAL FIB  . Back pain, chronic    PT STATES 5 BULGING DISKS AND PINCHED NERVES--PT ON OXYCODONE 4 TIMES A DAY FOR HIS PAIN  . Chest pain 08/26/2015  . Chronic airway obstruction, not elsewhere classified 12/21/2012  . GERD (gastroesophageal reflux disease)   . Hypertension   . Impaired fasting glucose 04/19/2013  . Peripheral vascular disease (HCC)    CHRONIC VENOUS INSUFFICIENCY  . Shortness of breath   . 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 Stroke ([place])   . Wears dentures     Assessment: 64 year old male with a history of atrial fibrillation and recent CVA requiring mechanical thrombectomy in November. Patient was discharged to rehab and left AMA. Presents this admission s/p aspiration event and shortness of breath as was also found to have large R-thigh hematoma. Per orthopedics, no surgical management of hematoma. Lovenox was initially held on admission (prior dose was 130 mg subcutaneous every 12 hours.  Last dose unknown.   Patient's weight has increased significantly- currently  weighing 157 kg per RN.  Discussed with Dr. [place] Silos - will start IV Heparin for now (quick half-life) and monitor hematoma to ensure expansion before resuming Lovenox for now.  SCr is stable. Hemoglobin is stable. Platelets are stable.   Goal of Therapy:  Heparin level 0.3-0.7 units/ml  Monitor platelets by anticoagulation protocol: Yes   Plan:  Heparin (no bolus with hematoma) at 1400 units/hr.  Heparin level in 6 hours.  Daily Heparin level and CBC. Monitor hematoma and for any new bleeding.  Follow-up anticoagulation plan.   Sloan Leiter, PharmD, BCPS, BCCCP Clinical Pharmacist Please refer to [facility] for [place] numbers 06/21/2019,11:32 AM

## 2019-06-21 NOTE — Progress Notes (Signed)
[place] for Heparin Indication: atrial fibrillation and recent thrombotic CVA  Allergies  Allergen Reactions  . Vioxx [Rofecoxib] Rash    Patient Measurements: Height: 6\' 3"  (190.5 cm) Weight: (!) 346 lb 2 oz (157 kg) IBW/kg (Calculated) : 84.5 Heparin Dosing Weight: 121 kg  Vital Signs: Temp: 98.5 F (36.9 C) (01/06 2053) Temp Source: Oral (01/06 2053) BP: 124/54 (01/06 2053) Pulse Rate: 75 (01/06 2053)  Labs: Recent Labs    06/19/19 0931 06/21/19 0256 06/21/19 2119  HGB 10.0* 9.1*  --   HCT 31.5* 28.8*  --   PLT 257 291  --   HEPARINUNFRC  --   --  <0.10*  CREATININE 0.55* 0.63  --     Estimated Creatinine Clearance: 155.7 mL/min (by C-G formula based on SCr of 0.63 mg/dL).   Medical History: Past Medical History:  Diagnosis Date  . Arthritis   . Atrial fibrillation    HX OF SICK SINUS SYNDROME-ATRIAL FIB  . Back pain, chronic    PT STATES 5 BULGING DISKS AND PINCHED NERVES--PT ON OXYCODONE 4 TIMES A DAY FOR HIS PAIN  . Chest pain 08/26/2015  . Chronic airway obstruction, not elsewhere classified 12/21/2012  . GERD (gastroesophageal reflux disease)   . Hypertension   . Impaired fasting glucose 04/19/2013  . Peripheral vascular disease (HCC)    CHRONIC VENOUS INSUFFICIENCY  . Shortness of breath   . 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 Stroke ([place])   . Wears dentures     Assessment: 64 year old male with a history of atrial fibrillation and recent CVA requiring mechanical thrombectomy in November. Patient was discharged to rehab and left AMA. Presents this admission s/p aspiration event and shortness of breath as was also found to have large R-thigh hematoma. Per orthopedics, no surgical management of hematoma. Lovenox was initially held on admission (prior dose was 130 mg subcutaneous every 12 hours.  Last dose unknown.   Pharmacy to dose  heparin  and monitor hematoma to ensure expansion before resuming Lovenox  -initial heparin level < 0.1 -per RN the pump was alarming and the IV site was changed   Goal of Therapy:  Heparin level 0.3-0.7 units/ml  Monitor platelets by anticoagulation protocol: Yes   Plan:  No heparin bolus Increase infusion to 1700 units/hr Heparin level in 6 hours.  Daily Heparin level and CBC.  Hildred Laser, PharmD Clinical Pharmacist **Pharmacist phone directory can now be found on [email or web address] ([email or web address]).  Listed under [place].

## 2019-06-21 NOTE — Progress Notes (Addendum)
[facility] for Heparin Indication: atrial fibrillation and recent thrombotic CVA  Allergies  Allergen Reactions  . Vioxx [Rofecoxib] Rash    Patient Measurements: Height: 6\' 3"  (190.5 cm) Weight: (!) 346 lb 2 oz (157 kg) IBW/kg (Calculated) : 84.5 Heparin Dosing Weight: 121 kg  Vital Signs: Temp: 98.5 F (36.9 C) (01/06 2053) Temp Source: Oral (01/06 2053) BP: 124/54 (01/06 2053) Pulse Rate: 75 (01/06 2053)  Labs: Recent Labs    06/19/19 0931 06/21/19 0256 06/21/19 2119  HGB 10.0* 9.1*  --   HCT 31.5* 28.8*  --   PLT 257 291  --   HEPARINUNFRC  --   --  <0.10*  CREATININE 0.55* 0.63  --     Estimated Creatinine Clearance: 155.7 mL/min (by C-G formula based on SCr of 0.63 mg/dL).   Medical History: Past Medical History:  Diagnosis Date  . Arthritis   . Atrial fibrillation    HX OF SICK SINUS SYNDROME-ATRIAL FIB  . Back pain, chronic    PT STATES 5 BULGING DISKS AND PINCHED NERVES--PT ON OXYCODONE 4 TIMES A DAY FOR HIS PAIN  . Chest pain 08/26/2015  . Chronic airway obstruction, not elsewhere classified 12/21/2012  . GERD (gastroesophageal reflux disease)   . Hypertension   . Impaired fasting glucose 04/19/2013  . Peripheral vascular disease (HCC)    CHRONIC VENOUS INSUFFICIENCY  . Shortness of breath   . 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 Stroke ([place])   . Wears dentures     Assessment: 64 year old male with a history of atrial fibrillation and recent CVA requiring mechanical thrombectomy in November. Patient was discharged to rehab and left AMA. Presents this admission s/p aspiration event and shortness of breath as was also found to have large R-thigh hematoma. Per orthopedics, no surgical management of hematoma. Lovenox was initially held on admission (prior dose was 130 mg subcutaneous every 12 hours.  Last dose unknown.   Pharmacy to dose  heparin  and monitor hematoma to ensure expansion before resuming Lovenox  -initial heparin level < 0.1   Goal of Therapy:  Heparin level 0.3-0.7 units/ml  Monitor platelets by anticoagulation protocol: Yes   Plan:  No heparin bolus Increase infusion to 1700 units/hr Heparin level in 6 hours.  Daily Heparin level and CBC.  Hildred Laser, PharmD Clinical Pharmacist **Pharmacist phone directory can now be found on [email or web address] ([email or web address]).  Listed under [place].

## 2019-06-21 NOTE — Plan of Care (Signed)
  Problem: Clinical Measurements: Goal: Ability to maintain clinical measurements within normal limits will improve Outcome: Progressing   Problem: Clinical Measurements: Goal: Cardiovascular complication will be avoided Outcome: Progressing   Problem: Nutrition: Goal: Adequate nutrition will be maintained Outcome: Progressing   

## 2019-06-21 NOTE — Progress Notes (Addendum)
Physical Therapy Treatment Patient Details Name: BRENNYN MODE MRN: [id number] DOB: 1956-02-09 Today's Date: 06/21/2019    History of Present Illness 64 y.o. male with history of atrial fibrillation, hypertension, COPD was recently admitted for acute CVA underwent mechanical thrombectomy for right MCA infarct with patient having dysphagia and difficulty speaking had to undergo PEG tube placement and was discharged to rehab when patient signed out himself [place] and went home and try to eat when he aspirated and became short of breath and EMS was called.      PT Comments    Arrived at pt's room with [facility] and discussed plan with pt for lateral transfer and WC training. Pt agreeable to participate with therapy. On rising into a seated position, pt c/o slight dizziness that progressed to numbness in BIL LEs. Pt returned to supine, his eyes closed but he remained responsive. RN notified and vitals taken. See below. Pt found to be hypotensive. Treatment discontinued for now. At end of session, pt also requesting to speak with a pastor. Spiritual care notified. Will check back as time allows.     06/21/19 1300  Vitals  BP (!) 92/46  MAP (mmHg) (!) 60  BP Location Right Arm  BP Method Automatic  Patient Position (if appropriate) Lying  Pulse Rate 72  Oxygen Therapy  SpO2 97 %  O2 Device Room Air  Patient Activity (if Appropriate) In bed     Follow Up Recommendations  SNF;Supervision/Assistance - 24 hour     Equipment Recommendations  Wheelchair (measurements PT);Wheelchair cushion (measurements PT);Hospital bed;Rolling walker with 5" wheels    Recommendations for Other Services       Precautions / Restrictions Precautions Precautions: Fall Precaution Comments: R groin, pt found to have large hematoma) and knee pain Restrictions Weight Bearing Restrictions: No    Mobility  Bed Mobility Overal bed mobility: Needs Assistance Bed Mobility: Supine to Sit;Sit to  Supine     Supine to sit: Min guard;HOB elevated Sit to supine: Min assist;HOB elevated   General bed mobility comments: Pt able to progress OOB with min guard for safety. Min A required to progress LEs back into bed at end of session  Transfers                 General transfer comment: unable to perform today secondary to sudden onset of LE numbness  Ambulation/Gait                 Stairs             Wheelchair Mobility    Modified Rankin (Stroke Patients Only) Modified Rankin (Stroke Patients Only) Pre-Morbid Rankin Score: No significant disability Modified Rankin: Severe disability     Balance Overall balance assessment: Needs assistance Sitting-balance support: Feet supported;No upper extremity supported Sitting balance-Leahy Scale: Fair Sitting balance - Comments: supervision for safety. Able to sit EOB and write on note pad   Standing balance support: Bilateral upper extremity supported Standing balance-Leahy Scale: Zero Standing balance comment: dependent on physical assist                            Cognition Arousal/Alertness: Awake/alert Behavior During Therapy: WFL for tasks assessed/performed Overall Cognitive Status: Impaired/Different from baseline Area of Impairment: Safety/judgement;Problem solving;Awareness                         Safety/Judgement: Decreased awareness of safety Awareness: Emergent  Problem Solving: Difficulty sequencing;Requires verbal cues;Requires tactile cues General Comments: Pt non verbal during session. Communicating with gestures and writing      Exercises      General Comments        Pertinent Vitals/Pain Pain Assessment: Faces Faces Pain Scale: Hurts little more Pain Location: R knee and groin pain Pain Descriptors / Indicators: Grimacing;Guarding Pain Intervention(s): Monitored during session;Limited activity within patient's tolerance    Home Living                       Prior Function            PT Goals (current goals can now be found in the care plan section) Acute Rehab PT Goals PT Goal Formulation: With patient Time For Goal Achievement: 06/23/19 Potential to Achieve Goals: Fair Progress towards PT goals: Progressing toward goals(slowly)    Frequency    Min 3X/week      PT Plan Current plan remains appropriate    Co-evaluation              AM-PAC PT "6 Clicks" Mobility   Outcome Measure  Help needed turning from your back to your side while in a flat bed without using bedrails?: A Little Help needed moving from lying on your back to sitting on the side of a flat bed without using bedrails?: A Little Help needed moving to and from a bed to a chair (including a wheelchair)?: A Lot Help needed standing up from a chair using your arms (e.g., wheelchair or bedside chair)?: A Lot Help needed to walk in hospital room?: A Lot Help needed climbing 3-5 steps with a railing? : Total 6 Click Score: 13    End of Session   Activity Tolerance: Patient tolerated treatment well;Treatment limited secondary to medical complications (Comment)(hypotension) Patient left: in bed;with call bell/phone within reach Nurse Communication: Mobility status;Other (comment)(LE numbness and low BP) PT Visit Diagnosis: Difficulty in walking, not elsewhere classified (R26.2);Muscle weakness (generalized) (M62.81)     Time: 1227-1300 PT Time Calculation (min) (ACUTE ONLY): 33 min  Charges:  $Therapeutic Activity: 23-37 mins                    Benjiman Core, [place] Pager [phone] Acute Rehab   Allena Katz 06/21/2019, 1:56 PM

## 2019-06-21 NOTE — Progress Notes (Signed)
PROGRESS NOTE    Kyle [name]  [id number] DOB: 1955-08-16 DOA: 06/08/2019 PCP: [name], MD    Brief Narrative:  64 year old male with history of persistent A. fib, hypertension, COPD, recent admission for acute CVA requiring mechanical thrombectomy for right MCA infarct with subsequent dysphagia/difficulty speaking for which she required PEG tube placement and was discharged to rehab but subsequently signed out [place] and went home and tried to eat when he subsequently aspirated, became more short of breath and presented to the ED.  Was started on intravenous antibiotics.  Subsequently, he was also found to have large right thigh hematoma for which orthopedics recommended conservative management with IV antibiotics and H&H with no need for surgical intervention   Assessment & Plan:   Principal Problem:   Aspiration into airway Active Problems:   Adjustment disorder with mixed anxiety and depressed mood   Essential hypertension   Chronic atrial fibrillation (HCC)   Dysphagia due to recent cerebrovascular accident   Aspiration pneumonia ([place])  1 large right thigh hematoma Noted on MRI of the right femur that showed a large intramuscular hematoma and anterior medial right upper leg, superinfection of hematoma possible.  Patient is Lovenox held.  H&H has remained stable.  Prior hospitalist spoke with orthopedics on the phone who recommended conservative management with IV antibiotics, H&H monitoring then no need for surgical intervention.  Zosyn subsequently discontinued on 06/18/2019.  As H&H is stable will resume anticoagulation with heparin and monitor closely.  Follow.  2.  Acute respiratory failure with hypoxia secondary to aspiration into the airway/aspiration pneumonia Patient with a history of dysphagia from recent stroke status post PEG tube placement.  Patient noted to have aspirated after attempting to eat at home after eloping from SNF.  Patient status  post treatment of empiric IV antibiotics.  SLP reevaluated patient and diet recommendations are still NPO.  Patient not hypoxic.  Continue PEG tube feedings.  Patient will be unsafe to be discharged home and appropriate for SNF placement.  Follow.  3.  Hypoglycemia Patient has had intermittent persistent hypoglycemia despite being on tube feeds.  Tube feeding subsequently changed on 06/19/2019 by dietary.  CBGs noted to be 118 this morning.  4.  Hypernatremia Improved.  Continue free water via PEG tube.  5.  Persistent A. fib Continue metoprolol for rate control.  Lovenox held secondary to hematoma.  Patient recently discharged to SNF on Lovenox.  H&H stable.  Will resume patient on IV heparin as hemoglobin is stable and monitor closely for worsening hemoglobin on anticoagulation and worsening hematoma if stable could likely transition to Lovenox on discharge.  6.  Recent thrombotic stroke Status post thrombectomy during recent admission.  Patient still with dysphagia.  Needs SNF.  Patient to be started on IV heparin today as H&H is stable and will need to monitor H&H closely as well as hematoma.  PT/OT/SLP.  Follow.  7.  Iron deficiency anemia H&H stable.  Transfuse for hemoglobin less than 7.  8.  Right knee pain Secondary to degenerative joint disease.  Plain films done were negative for any fracture.  Voltaren gel.  9.  Chronic pain Continue current pain regimen.  10.  Morbid obesity  11.  Generalized deconditioning Patient seen by PT and recommending SNF.  Per Dr.Alekh patient agreeable to SNF.  Social work following.  Palliative care following.  Current CODE STATUS has been changed to DNR.   DVT prophylaxis: Start heparin today and monitor hematoma Code Status: DNR  Family Communication: Updated patient.  No family at bedside. Disposition Plan: SNF once anticoagulation has been resumed with no worsening of hematoma and stabilization of H&H hopefully in the next 24 to 48  hours.   Consultants:   Palliative care.  Procedures:  Chest x-ray 06/08/2019, 06/11/2019  Plain films of the right knee 06/10/2019  MRI right femur 06/12/2019  Antimicrobials:   IV Zosyn 06/13/2019>>>> 06/18/2019  Levaquin per J-tube 06/09/2019>>> 06/14/2019  IV Unasyn 06/08/2019>>>> 06/13/2019     Subjective: Patient with dysarthric speech.  Patient with some complaints of some left-sided pain however unable to clarify.  Shakes head and nods head to some questions.  No bleeding noted.  No fevers.  Per RN patient complained of some lower extremity weakness to therapy as well as some numbness in bilateral lower extremities.  Objective: Vitals:   06/20/19 1645 06/20/19 2300 06/21/19 0500 06/21/19 0843  BP: 118/62 110/62  (!) 110/43  Pulse: 88 88  73  Resp: 16 16  20   Temp: 98.4 F (36.9 C) 98 F (36.7 C)  98.2 F (36.8 C)  TempSrc: Oral Oral  Oral  SpO2: 97% 98%  99%  Weight:   (!) 157.8 kg   Height:        Intake/Output Summary (Last 24 hours) at 06/21/2019 0942 Last data filed at 06/21/2019 0631 Gross per 24 hour  Intake 1640 ml  Output 2025 ml  Net -385 ml   Filed Weights   06/19/19 0233 06/20/19 0457 06/21/19 0500  Weight: (!) 156.9 kg (!) 160.7 kg (!) 157.8 kg    Examination:  General exam: Appears calm and comfortable. Respiratory system: Clear to auscultation anterior lung fields. Respiratory effort normal. Cardiovascular system: S1 & S2 heard, RRR. No JVD, murmurs, rubs, gallops or clicks. No pedal edema. Gastrointestinal system: Abdomen is obese, nondistended, soft and nontender. No organomegaly or masses felt. Normal bowel sounds heard. Central nervous system: Dysarthric speech. No focal neurological deficits. Extremities: Symmetric 5 x 5 power. Skin: No rashes, lesions or ulcers Psychiatry: Judgement and insight unable to assess due to dysarthric speech. Mood & affect appropriate.     Data Reviewed: I have personally reviewed following labs  and imaging studies  CBC: Recent Labs  Lab 06/16/19 0422 06/17/19 0235 06/18/19 0711 06/19/19 0931 06/21/19 0256  WBC 9.3 9.2 8.5 5.7 6.0  NEUTROABS 6.5 6.6 6.4 3.9 3.7  HGB 9.0* 8.6* 8.8* 10.0* 9.1*  HCT 30.2* 28.5* 28.0* 31.5* 28.8*  MCV 98.1 97.6 94.3 93.5 94.7  PLT 315 307 296 257 [id number]   Basic Metabolic Panel: Recent Labs  Lab 06/16/19 0422 06/17/19 0235 06/18/19 0711 06/19/19 0931 06/21/19 0256  NA 143 138 132* 135 135  K 4.0 3.7 3.8 4.3 3.7  CL 111 105 100 102 100  CO2 25 26 25 26 26   GLUCOSE 98 108* 121* 103* 113*  BUN 27* 25* 20 17 16   CREATININE 0.73 0.59* 0.62 0.55* 0.63  CALCIUM 8.3* 8.1* 8.0* 8.1* 8.3*  MG 2.1 1.9 1.8 1.9 1.8   GFR: Estimated Creatinine Clearance: 156.1 mL/min (by C-G formula based on SCr of 0.63 mg/dL). Liver Function Tests: No results for input(s): AST, ALT, ALKPHOS, BILITOT, PROT, ALBUMIN in the last 168 hours. No results for input(s): LIPASE, AMYLASE in the last 168 hours. No results for input(s): AMMONIA in the last 168 hours. Coagulation Profile: No results for input(s): INR, PROTIME in the last 168 hours. Cardiac Enzymes: No results for input(s): CKTOTAL, CKMB, CKMBINDEX, TROPONINI in the last  168 hours. BNP (last 3 results) No results for input(s): PROBNP in the last 8760 hours. HbA1C: No results for input(s): HGBA1C in the last 72 hours. CBG: Recent Labs  Lab 06/20/19 1224 06/20/19 1641 06/20/19 2052 06/21/19 0547 06/21/19 0840  GLUCAP 86 79 93 118* 87   Lipid Profile: No results for input(s): CHOL, HDL, LDLCALC, TRIG, CHOLHDL, LDLDIRECT in the last 72 hours. Thyroid Function Tests: No results for input(s): TSH, T4TOTAL, FREET4, T3FREE, THYROIDAB in the last 72 hours. Anemia Panel: No results for input(s): VITAMINB12, FOLATE, FERRITIN, TIBC, IRON, RETICCTPCT in the last 72 hours. Sepsis Labs: No results for input(s): PROCALCITON, LATICACIDVEN in the last 168 hours.  Recent Results (from the past 240 hour(s))   SARS CORONAVIRUS 2 (TAT 6-24 HRS) Nasopharyngeal Nasopharyngeal Swab     Status: None   Collection Time: 06/20/19  3:57 PM   Specimen: Nasopharyngeal Swab  Result Value Ref Range Status   SARS Coronavirus 2 NEGATIVE NEGATIVE Final    Comment: (NOTE) SARS-CoV-2 target nucleic acids are NOT DETECTED. The SARS-CoV-2 RNA is generally detectable in upper and lower respiratory specimens during the acute phase of infection. Negative results do not preclude SARS-CoV-2 infection, do not rule out co-infections with other pathogens, and should not be used as the sole basis for treatment or other patient management decisions. Negative results must be combined with clinical observations, patient history, and epidemiological information. The expected result is Negative. Fact Sheet for Patients: SugarRoll.be Fact Sheet for Healthcare Providers: https://www.woods-mathews.com/ This test is not yet approved or cleared by the [place] FDA and  has been authorized for detection and/or diagnosis of SARS-CoV-2 by FDA under an Emergency Use Authorization (EUA). This EUA will remain  in effect (meaning this test can be used) for the duration of the COVID-19 declaration under Section 56 4(b)(1) of the Act, 21 U.S.C. section 360bbb-3(b)(1), unless the authorization is terminated or revoked sooner. Performed at [facility], [place] [address], [place], [place] [address]          Radiology Studies: No results found.      Scheduled Meds: . acetaminophen  1,000 mg Oral Once  . ALPRAZolam  0.5 mg Oral TID  . diclofenac Sodium  2 g Topical QID  . feeding supplement (PRO-STAT SUGAR FREE 64)  30 mL Per Tube Daily  . free water  200 mL Per Tube Q6H  . gabapentin  600 mg Per Tube QID  . glycopyrrolate  0.2 mg Intravenous TID  . mouth rinse  15 mL Mouth Rinse BID  . metoprolol tartrate  12.5 mg Per Tube BID  . oxyCODONE  10 mg Oral TID  . pantoprazole  sodium  40 mg Per Tube Daily  . sertraline  50 mg Per Tube Daily   Continuous Infusions: . sodium chloride    . dextrose 50 mL/hr at 06/21/19 0259  . feeding supplement (JEVITY 1.5 CAL/FIBER) 1,000 mL (06/20/19 2322)     LOS: 9 days    Time spent: 40 minutes    Irine Seal, MD [facility]  If 7PM-7AM, please contact night-coverage [email or web address] 06/21/2019, 9:42 AM

## 2019-06-21 NOTE — Progress Notes (Signed)
Chaplain responded to page to see [name].  Upon arrival [name] was sleeping.    A chaplain will follow-up.

## 2019-06-22 LAB — CBC
HCT: 31 % — ABNORMAL LOW (ref 39.0–52.0)
Hemoglobin: 9.8 g/dL — ABNORMAL LOW (ref 13.0–17.0)
MCH: 29.8 pg (ref 26.0–34.0)
MCHC: 31.6 g/dL (ref 30.0–36.0)
MCV: 94.2 fL (ref 80.0–100.0)
Platelets: 312 10*3/uL (ref 150–400)
RBC: 3.29 MIL/uL — ABNORMAL LOW (ref 4.22–5.81)
RDW: 20.3 % — ABNORMAL HIGH (ref 11.5–15.5)
WBC: 5.9 10*3/uL (ref 4.0–10.5)
nRBC: 0 % (ref 0.0–0.2)

## 2019-06-22 LAB — GLUCOSE, CAPILLARY
Glucose-Capillary: 108 mg/dL — ABNORMAL HIGH (ref 70–99)
Glucose-Capillary: 57 mg/dL — ABNORMAL LOW (ref 70–99)
Glucose-Capillary: 59 mg/dL — ABNORMAL LOW (ref 70–99)
Glucose-Capillary: 73 mg/dL (ref 70–99)
Glucose-Capillary: 85 mg/dL (ref 70–99)
Glucose-Capillary: 91 mg/dL (ref 70–99)
Glucose-Capillary: 92 mg/dL (ref 70–99)
Glucose-Capillary: 96 mg/dL (ref 70–99)

## 2019-06-22 LAB — BASIC METABOLIC PANEL
Anion gap: 8 (ref 5–15)
BUN: 18 mg/dL (ref 8–23)
CO2: 28 mmol/L (ref 22–32)
Calcium: 8.4 mg/dL — ABNORMAL LOW (ref 8.9–10.3)
Chloride: 99 mmol/L (ref 98–111)
Creatinine, Ser: 0.64 mg/dL (ref 0.61–1.24)
GFR calc Af Amer: >60 mL/min (ref >60)
GFR calc non Af Amer: >60 mL/min (ref >60)
Glucose, Bld: 115 mg/dL — ABNORMAL HIGH (ref 70–99)
Potassium: 4 mmol/L (ref 3.5–5.1)
Sodium: 135 mmol/L (ref 135–145)

## 2019-06-22 LAB — HEPARIN LEVEL (UNFRACTIONATED)
Heparin Unfractionated: 0.15 IU/mL — ABNORMAL LOW (ref 0.30–0.70)
Heparin Unfractionated: 0.11 IU/mL — ABNORMAL LOW (ref 0.30–0.70)
Heparin Unfractionated: 0.42 IU/mL (ref 0.30–0.70)

## 2019-06-22 MED ORDER — DEXTROSE 50 % IV SOLN
50.0000 mL | Freq: Once | INTRAVENOUS | Status: AC
  Administered 2019-06-22: 50 mL via INTRAVENOUS

## 2019-06-22 MED ORDER — DEXTROSE 50 % IV SOLN
INTRAVENOUS | Status: AC
  Filled 2019-06-22: qty 50

## 2019-06-22 NOTE — TOC Progression Note (Signed)
Transition of Care [facility]) - Progression Note    Patient Details  Name: Kyle [name] MRN: [place]:[id number] Date of Birth: 01-Nov-1955  [facility] of [facility] CM/SW Contact  Sharin Mons, RN Phone Number: [phone] 06/22/2019, 11:40 AM  Clinical Narrative:    Per MD pt will be medically ready for transition to SNF on tomorrow. Pt will transition to [facility] in [place]. NCM will continue to for disposition needs.   Expected Discharge Plan: [place] Barriers to Discharge: Continued Medical Work up  Expected Discharge Plan and Services Expected Discharge Plan: [place] In-house Referral: Clinical Social Work Discharge Planning Services: CM Consult Post Acute Care Choice: [place] arrangements for the past 2 months: Single Family Home Expected Discharge Date: 06/09/19               DME Arranged: N/A          Social Determinants of Health (SDOH) Interventions    Readmission Risk Interventions No flowsheet data found.

## 2019-06-22 NOTE — Progress Notes (Signed)
PROGRESS NOTE    Kyle [name]  [id number] DOB: 07-15-1955 DOA: 06/08/2019 PCP: [name], MD    Brief Narrative:  64 year old male with history of persistent A. fib, hypertension, COPD, recent admission for acute CVA requiring mechanical thrombectomy for right MCA infarct with subsequent dysphagia/difficulty speaking for which she required PEG tube placement and was discharged to rehab but subsequently signed out [place] and went home and tried to eat when he subsequently aspirated, became more short of breath and presented to the ED.  Was started on intravenous antibiotics.  Subsequently, he was also found to have large right thigh hematoma for which orthopedics recommended conservative management with IV antibiotics and H&H with no need for surgical intervention   Assessment & Plan:   Principal Problem:   Aspiration into airway Active Problems:   Adjustment disorder with mixed anxiety and depressed mood   Essential hypertension   Chronic atrial fibrillation (HCC)   Dysphagia due to recent cerebrovascular accident   Aspiration pneumonia ([place])   Right anterior knee pain   Hematoma of leg, right, subsequent encounter   Hypernatremia  1 large right thigh hematoma Noted on MRI of the right femur that showed a large intramuscular hematoma and anterior medial right upper leg, superinfection of hematoma possible.  Patient is Lovenox held.  H&H has remained stable.  Prior hospitalist spoke with orthopedics on the phone who recommended conservative management with IV antibiotics, H&H monitoring then no need for surgical intervention.  Zosyn subsequently discontinued on 06/18/2019.  Patient resumed back on heparin yesterday H&H stable and hematoma seems stable.  Could likely transition to full dose Lovenox on discharge tomorrow.  2.  Acute respiratory failure with hypoxia secondary to aspiration into the airway/aspiration pneumonia Patient with a history of dysphagia from  recent stroke status post PEG tube placement.  Patient noted to have aspirated after attempting to eat at home after eloping from SNF.  Patient status post treatment of empiric IV antibiotics.  SLP reevaluated patient and diet recommendations are still NPO.  Patient not hypoxic.  Continue PEG tube feedings.  Patient will be unsafe to be discharged home and appropriate for SNF placement.  Follow.  3.  Hypoglycemia Patient has had intermittent persistent hypoglycemia despite being on tube feeds.  Tube feeding subsequently changed on 06/19/2019 by dietary.  CBGs noted to be 108 this morning.  4.  Hypernatremia Improved.  Continue free water via PEG tube.  5.  Persistent A. fib Continue metoprolol for rate control.  Lovenox held secondary to hematoma.  Patient recently discharged to SNF on Lovenox.  H&H stable.  IV heparin has been resumed hemoglobin remained stable.  Could likely transition to full dose Lovenox tomorrow in anticipation of discharge.   6.  Recent thrombotic stroke Status post thrombectomy during recent admission.  Patient still with dysphagia.  Needs SNF.  Patient started on IV heparin H&H has remained stable and hematoma seems stable.  Could likely transition to Lovenox full dose tomorrow if patient continues to be stable.  PT/OT/SLP.  Follow.  7.  Iron deficiency anemia H&H stable.  Transfuse for hemoglobin < 7.  8.  Right knee pain Secondary to degenerative joint disease.  Plain films done were negative for any fracture.  Continue Voltaren gel.  9.  Chronic pain Continue current pain regimen.   10.  Morbid obesity  11.  Generalized deconditioning Patient seen by PT and recommending SNF.  Per Dr.Alekh patient agreeable to SNF.  Social work following.  Palliative  care following.  Current CODE STATUS has been changed to DNR.   DVT prophylaxis: Heparin monitor hematoma and H&H. Code Status: DNR Family Communication: Updated patient.  No family at bedside. Disposition Plan:  SNF once anticoagulation has been resumed with no worsening of hematoma and stabilization of H&H hopefully tomorrow.    Consultants:   Palliative care.  Procedures:  Chest x-ray 06/08/2019, 06/11/2019  Plain films of the right knee 06/10/2019  MRI right femur 06/12/2019  Antimicrobials:   IV Zosyn 06/13/2019>>>> 06/18/2019  Levaquin per J-tube 06/09/2019>>> 06/14/2019  IV Unasyn 06/08/2019>>>> 06/13/2019     Subjective: Patient with dysarthric speech.  Patient also writes questions on a piece of paper.  Denies any chest pain.  States breathing is so-so.   Objective: Vitals:   06/21/19 1733 06/21/19 2053 06/21/19 2300 06/22/19 0800  BP: 101/63 (!) 124/54 110/62 117/67  Pulse: 63 75 72   Resp: 18 18 16    Temp: 98.1 F (36.7 C) 98.5 F (36.9 C) 98 F (36.7 C)   TempSrc: Oral Oral Oral   SpO2: 90% 97% 96% 100%  Weight:      Height:        Intake/Output Summary (Last 24 hours) at 06/22/2019 1100 Last data filed at 06/21/2019 2319 Gross per 24 hour  Intake 1143.78 ml  Output 1450 ml  Net -306.22 ml   Filed Weights   06/20/19 0457 06/21/19 0500 06/21/19 1339  Weight: (!) 160.7 kg (!) 157.8 kg (!) 157 kg    Examination:  General exam: Appears calm and comfortable. Respiratory system: Clear to auscultation bilaterally anterior lung fields.  Normal respiratory effort.  Speaking in full sentences.  Cardiovascular system: Regular rate rhythm no murmurs rubs or gallops.  No JVD.  No lower extremity edema.  Gastrointestinal system: Abdomen is obese, nontender, nondistended, soft, positive bowel sounds.  No rebound.  No guarding. Central nervous system: Dysarthric speech. No focal neurological deficits. Extremities: Symmetric 5 x 5 power. Skin: No rashes, lesions or ulcers Psychiatry: Judgement and insight fair. Mood & affect appropriate.     Data Reviewed: I have personally reviewed following labs and imaging studies  CBC: Recent Labs  Lab 06/16/19 0422  06/17/19 0235 06/18/19 0711 06/19/19 0931 06/21/19 0256 06/22/19 0432  WBC 9.3 9.2 8.5 5.7 6.0 5.9  NEUTROABS 6.5 6.6 6.4 3.9 3.7  --   HGB 9.0* 8.6* 8.8* 10.0* 9.1* 9.8*  HCT 30.2* 28.5* 28.0* 31.5* 28.8* 31.0*  MCV 98.1 97.6 94.3 93.5 94.7 94.2  PLT 315 307 296 257 291 [id number]   Basic Metabolic Panel: Recent Labs  Lab 06/16/19 0422 06/17/19 0235 06/18/19 0711 06/19/19 0931 06/21/19 0256 06/22/19 0432  NA 143 138 132* 135 135 135  K 4.0 3.7 3.8 4.3 3.7 4.0  CL 111 105 100 102 100 99  CO2 25 26 25 26 26 28   GLUCOSE 98 108* 121* 103* 113* 115*  BUN 27* 25* 20 17 16 18   CREATININE 0.73 0.59* 0.62 0.55* 0.63 0.64  CALCIUM 8.3* 8.1* 8.0* 8.1* 8.3* 8.4*  MG 2.1 1.9 1.8 1.9 1.8  --    GFR: Estimated Creatinine Clearance: 155.7 mL/min (by C-G formula based on SCr of 0.64 mg/dL). Liver Function Tests: No results for input(s): AST, ALT, ALKPHOS, BILITOT, PROT, ALBUMIN in the last 168 hours. No results for input(s): LIPASE, AMYLASE in the last 168 hours. No results for input(s): AMMONIA in the last 168 hours. Coagulation Profile: No results for input(s): INR, PROTIME in the last 168 hours.  Cardiac Enzymes: No results for input(s): CKTOTAL, CKMB, CKMBINDEX, TROPONINI in the last 168 hours. BNP (last 3 results) No results for input(s): PROBNP in the last 8760 hours. HbA1C: No results for input(s): HGBA1C in the last 72 hours. CBG: Recent Labs  Lab 06/21/19 1733 06/21/19 2047 06/21/19 2322 06/22/19 0432 06/22/19 0745  GLUCAP 89 101* 103* 108* 85   Lipid Profile: No results for input(s): CHOL, HDL, LDLCALC, TRIG, CHOLHDL, LDLDIRECT in the last 72 hours. Thyroid Function Tests: No results for input(s): TSH, T4TOTAL, FREET4, T3FREE, THYROIDAB in the last 72 hours. Anemia Panel: No results for input(s): VITAMINB12, FOLATE, FERRITIN, TIBC, IRON, RETICCTPCT in the last 72 hours. Sepsis Labs: No results for input(s): PROCALCITON, LATICACIDVEN in the last 168 hours.  Recent  Results (from the past 240 hour(s))  SARS CORONAVIRUS 2 (TAT 6-24 HRS) Nasopharyngeal Nasopharyngeal Swab     Status: None   Collection Time: 06/20/19  3:57 PM   Specimen: Nasopharyngeal Swab  Result Value Ref Range Status   SARS Coronavirus 2 NEGATIVE NEGATIVE Final    Comment: (NOTE) SARS-CoV-2 target nucleic acids are NOT DETECTED. The SARS-CoV-2 RNA is generally detectable in upper and lower respiratory specimens during the acute phase of infection. Negative results do not preclude SARS-CoV-2 infection, do not rule out co-infections with other pathogens, and should not be used as the sole basis for treatment or other patient management decisions. Negative results must be combined with clinical observations, patient history, and epidemiological information. The expected result is Negative. Fact Sheet for Patients: SugarRoll.be Fact Sheet for Healthcare Providers: https://www.woods-mathews.com/ This test is not yet approved or cleared by the [place] FDA and  has been authorized for detection and/or diagnosis of SARS-CoV-2 by FDA under an Emergency Use Authorization (EUA). This EUA will remain  in effect (meaning this test can be used) for the duration of the COVID-19 declaration under Section 56 4(b)(1) of the Act, 21 U.S.C. section 360bbb-3(b)(1), unless the authorization is terminated or revoked sooner. Performed at [facility], [place] [address] [place], [place] [address]          Radiology Studies: No results found.      Scheduled Meds: . acetaminophen  1,000 mg Oral Once  . ALPRAZolam  0.5 mg Oral TID  . diclofenac Sodium  2 g Topical QID  . feeding supplement (PRO-STAT SUGAR FREE 64)  30 mL Per Tube Daily  . free water  200 mL Per Tube Q6H  . gabapentin  600 mg Per Tube QID  . glycopyrrolate  0.2 mg Intravenous TID  . mouth rinse  15 mL Mouth Rinse BID  . metoprolol tartrate  12.5 mg Per Tube BID  .  oxyCODONE  10 mg Oral TID  . pantoprazole sodium  40 mg Per Tube Daily  . sertraline  50 mg Per Tube Daily   Continuous Infusions: . sodium chloride    . dextrose 50 mL/hr at 06/21/19 1800  . feeding supplement (JEVITY 1.5 CAL/FIBER) 1,000 mL (06/21/19 1623)  . heparin 2,100 Units/hr (06/22/19 0914)     LOS: 10 days    Time spent: 40 minutes    Irine Seal, MD [facility]  If 7PM-7AM, please contact night-coverage [email or web address] 06/22/2019, 11:00 AM

## 2019-06-22 NOTE — Progress Notes (Signed)
ANTICOAGULATION CONSULT NOTE - Follow Up Consult  Pharmacy Consult for heparin Indication: atrial fibrillation and recent thrombotic CVA  Labs: Recent Labs    06/21/19 0256 06/22/19 0432 06/22/19 1139 06/22/19 2305  HGB 9.1* 9.8*  --   --   HCT 28.8* 31.0*  --   --   PLT 291 312  --   --   HEPARINUNFRC  --  0.15* 0.11* 0.42  CREATININE 0.63 0.64  --   --     Assessment/Plan:  64yo male therapeutic on heparin after rate changes. Will continue gtt at current rate and confirm stable with am labs.   Wynona Neat, PharmD, BCPS  06/22/2019,11:41 PM

## 2019-06-22 NOTE — Progress Notes (Signed)
ANTICOAGULATION CONSULT NOTE - Follow Up Consult  Pharmacy Consult for heparin Indication: atrial fibrillation and recent thrombotic CVA  Labs: Recent Labs    06/21/19 0256 06/21/19 2119 06/22/19 0432 06/22/19 1139  HGB 9.1*  --  9.8*  --   HCT 28.8*  --  31.0*  --   PLT 291  --  312  --   HEPARINUNFRC  --  <0.10* 0.15* 0.11*  CREATININE 0.63  --  0.64  --     Assessment: 64yo male subtherapeutic on heparin after rate increase; no gtt issues or signs of bleeding per RN; no change to hematoma.  Hep lvl remains low at 0.11 - cbc stable  Goal of Therapy:  Heparin level 0.3-0.7 units/ml   Plan:  Increase heparin to 2300 units/hr 2200 hep lvl  Barth Kirks, PharmD, BCPS, BCCCP Clinical Pharmacist [phone]  Please check AMION for all [place] numbers  06/22/2019 2:52 PM

## 2019-06-22 NOTE — Progress Notes (Signed)
ANTICOAGULATION CONSULT NOTE - Follow Up Consult  Pharmacy Consult for heparin Indication: atrial fibrillation and recent thrombotic CVA  Labs: Recent Labs    06/19/19 0931 06/21/19 0256 06/21/19 2119 06/22/19 0432  HGB 10.0* 9.1*  --  9.8*  HCT 31.5* 28.8*  --  31.0*  PLT 257 291  --  312  HEPARINUNFRC  --   --  <0.10* 0.15*  CREATININE 0.55* 0.63  --  0.64    Assessment: 64yo male subtherapeutic on heparin after rate increase; no gtt issues or signs of bleeding per RN; no change to hematoma.  Goal of Therapy:  Heparin level 0.3-0.7 units/ml   Plan:  Will increase heparin gtt by 3 units/kgABW/hr to 2100 units/hr and check level in 6 hours.    Wynona Neat, PharmD, BCPS  06/22/2019,5:43 AM

## 2019-06-22 NOTE — TOC Progression Note (Signed)
Transition of Care [facility]) - Progression Note    Patient Details  Name: Kyle [name] MRN: [id number] Date of Birth: 01-03-1956  Transition of Care [facility]) CM/SW [place], [place] Phone Number: 06/22/2019, 10:05 AM  Clinical Narrative:   CSW contacted by patient's cousin, Legrand Como, that he was coming at 4 to get the patient's debit card to buy him some pajamas and other essentials that he needs. CSW had patient get his debit card out of his wallet to give to Legrand Como, as well as a list of other things that he wanted, and met Legrand Como outside of the hospital to give them to him. Legrand Como also got the patient's cell phone back and gave it to CSW to provide to patient. CSW provided patient's phone to him, and he worked on trying to get it set up. CSW was alerted by [employer] that they have authorization for patient to admit when stable. CSW provided [facility] with information on patient's tube feeding to ensure they could have it ordered before he arrived.    Expected Discharge Plan: [place] Barriers to Discharge: Continued Medical Work up  Expected Discharge Plan and Services Expected Discharge Plan: [place] In-house Referral: Clinical Social Work Discharge Planning Services: CM Consult Post Acute Care Choice: [place] arrangements for the past 2 months: Single Family Home Expected Discharge Date: 06/09/19               DME Arranged: N/A         HH Arranged: OT, PT, RN [place] Agency: [place] Date [facility] Agency Contacted: 06/09/19 Time [place]: [place] Representative spoke with at [place]: [place] Interventions    Readmission Risk Interventions No flowsheet data found.

## 2019-06-23 ENCOUNTER — Inpatient Hospital Stay (HOSPITAL_COMMUNITY): Payer: Medicare PPO

## 2019-06-23 LAB — GLUCOSE, CAPILLARY
Glucose-Capillary: 84 mg/dL (ref 70–99)
Glucose-Capillary: 84 mg/dL (ref 70–99)
Glucose-Capillary: 77 mg/dL (ref 70–99)
Glucose-Capillary: 74 mg/dL (ref 70–99)
Glucose-Capillary: 95 mg/dL (ref 70–99)
Glucose-Capillary: 85 mg/dL (ref 70–99)

## 2019-06-23 LAB — BASIC METABOLIC PANEL
Anion gap: 9 (ref 5–15)
BUN: 21 mg/dL (ref 8–23)
CO2: 26 mmol/L (ref 22–32)
Calcium: 8.3 mg/dL — ABNORMAL LOW (ref 8.9–10.3)
Chloride: 94 mmol/L — ABNORMAL LOW (ref 98–111)
Creatinine, Ser: 0.75 mg/dL (ref 0.61–1.24)
GFR calc Af Amer: >60 mL/min (ref >60)
GFR calc non Af Amer: >60 mL/min (ref >60)
Glucose, Bld: 153 mg/dL — ABNORMAL HIGH (ref 70–99)
Potassium: 3.8 mmol/L (ref 3.5–5.1)
Sodium: 129 mmol/L — ABNORMAL LOW (ref 135–145)

## 2019-06-23 LAB — CBC
HCT: 31.9 % — ABNORMAL LOW (ref 39.0–52.0)
Hemoglobin: 9.7 g/dL — ABNORMAL LOW (ref 13.0–17.0)
MCH: 29.6 pg (ref 26.0–34.0)
MCHC: 30.4 g/dL (ref 30.0–36.0)
MCV: 97.3 fL (ref 80.0–100.0)
Platelets: 302 10*3/uL (ref 150–400)
RBC: 3.28 MIL/uL — ABNORMAL LOW (ref 4.22–5.81)
RDW: 20.1 % — ABNORMAL HIGH (ref 11.5–15.5)
WBC: 9 10*3/uL (ref 4.0–10.5)
nRBC: 0 % (ref 0.0–0.2)

## 2019-06-23 LAB — HEPARIN LEVEL (UNFRACTIONATED): Heparin Unfractionated: 0.31 IU/mL (ref 0.30–0.70)

## 2019-06-23 IMAGING — CR DG ABDOMEN 1V
3 series · 3 of 3 positions shown · non-contrast
Comparison: Chest x-ray [DATE].  Abdomen [DATE].

CLINICAL DATA: Abdominal pain.

EXAM:
ABDOMEN - 1 VIEW

[abdomen kub (1 of 3)]
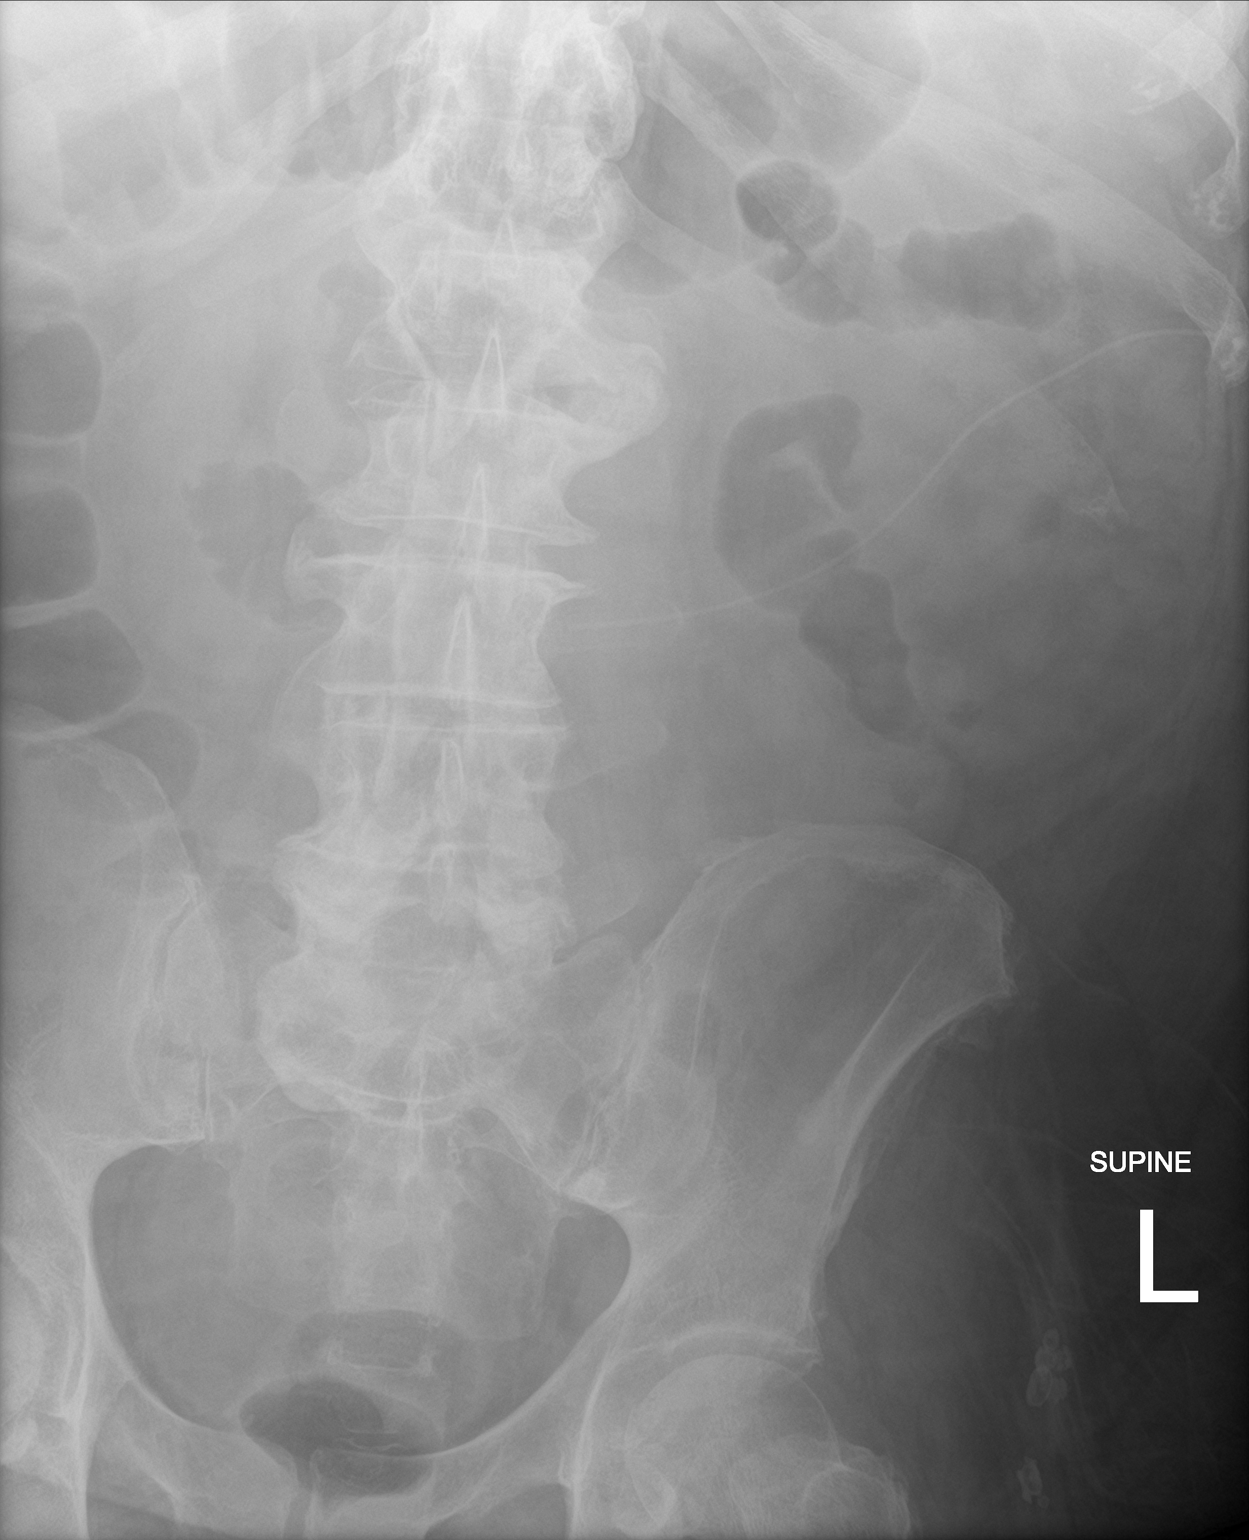

[abdomen kub (2 of 3)]
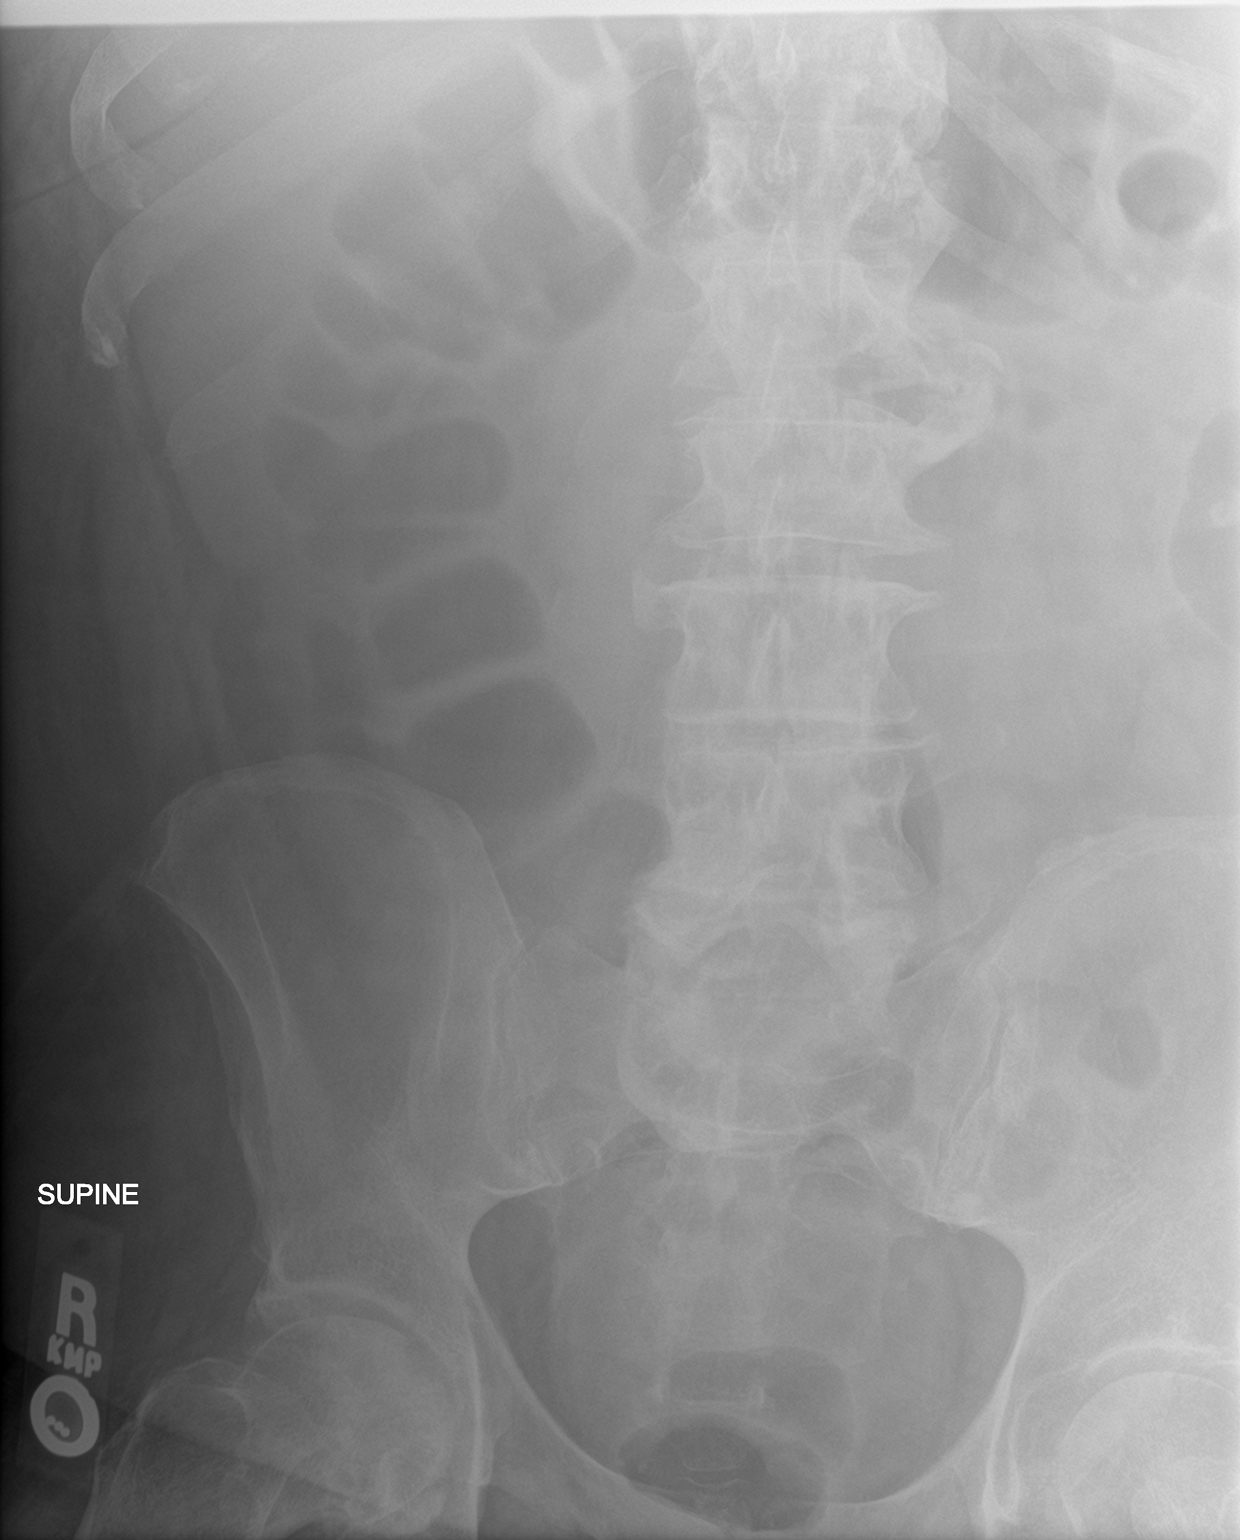

[abdomen kub (3 of 3)]
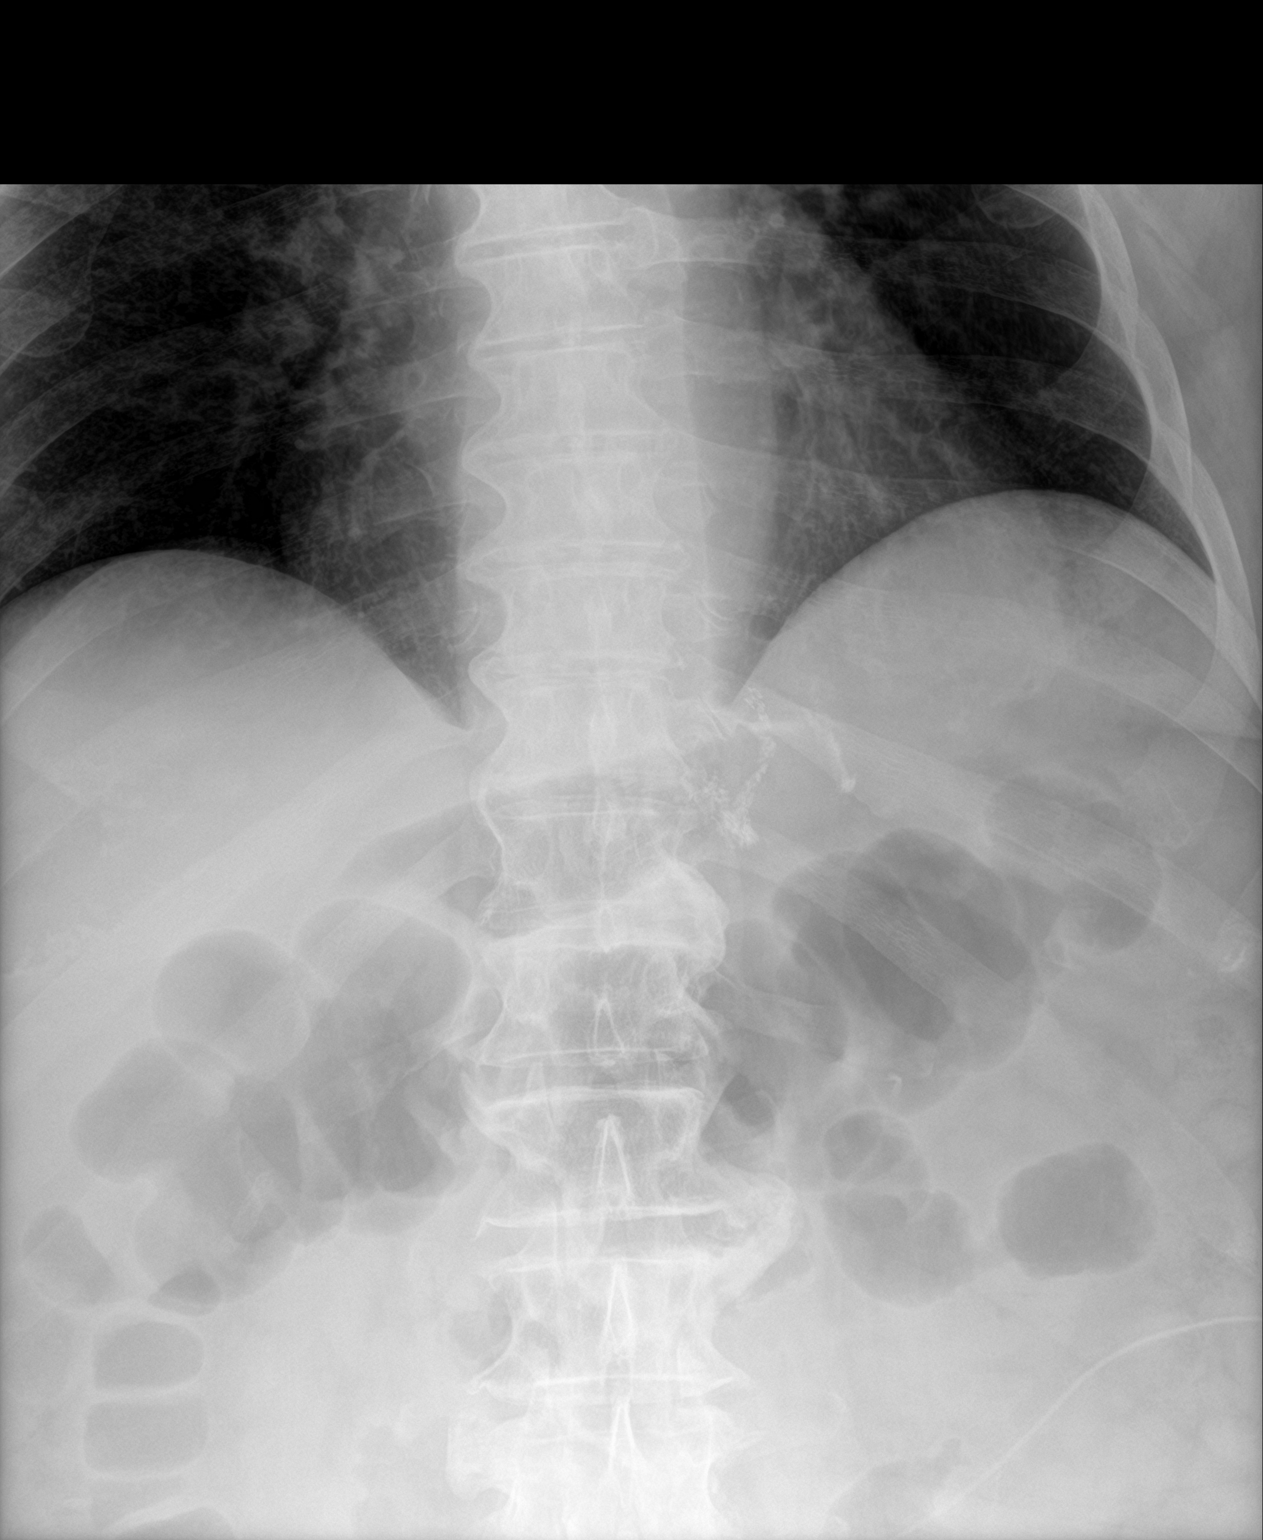

[3 of 3 positions shown; findings below may reference images not displayed]

FINDINGS: Catheter noted over the left abdomen. Surgical clips left upper
abdomen. Several slightly prominent air-filled loops of small bowel
noted. Colonic gas pattern is normal. No free air. Degenerative
change thoracolumbar spine. Aortoiliac and visceral atherosclerotic
vascular calcification. Bilateral pulmonary interstitial prominence
again noted, improved from prior exam.
IMPRESSION: 1. Several slightly prominent air-filled small bowel loops.
Follow-up exam suggested to demonstrate resolution. Colonic gas
pattern is normal. No free air identified.

2. Bilateral pulmonary interstitial prominence again noted, improved
from prior exam.

## 2019-06-23 MED ORDER — ENOXAPARIN SODIUM 300 MG/3ML IJ SOLN
1.0000 mg/kg | Freq: Two times a day (BID) | INTRAMUSCULAR | Status: DC
  Administered 2019-06-23 (×2): 155 mg via SUBCUTANEOUS
  Filled 2019-06-23 (×3): qty 1.55

## 2019-06-23 MED ORDER — GLYCOPYRROLATE 1 MG PO TABS: 0.5000 mg | ORAL_TABLET | Freq: Three times a day (TID) | ORAL | Status: DC

## 2019-06-23 MED ORDER — OXYCODONE HCL 5 MG/5ML PO SOLN: 10.0000 mg | Freq: Three times a day (TID) | ORAL | 0 refills | Status: DC

## 2019-06-23 MED ORDER — SIMETHICONE 80 MG PO CHEW: 160.0000 mg | CHEWABLE_TABLET | Freq: Four times a day (QID) | ORAL | 0 refills | Status: DC

## 2019-06-23 MED ORDER — GLYCOPYRROLATE 0.2 MG/ML IJ SOLN: 0.2000 mg | Freq: Three times a day (TID) | INTRAMUSCULAR | Status: DC

## 2019-06-23 MED ORDER — ENOXAPARIN SODIUM 300 MG/3ML IJ SOLN: 1.0000 mg/kg | Freq: Two times a day (BID) | INTRAMUSCULAR | 0 refills | Status: DC

## 2019-06-23 MED ORDER — SIMETHICONE 80 MG PO CHEW
160.0000 mg | CHEWABLE_TABLET | Freq: Four times a day (QID) | ORAL | Status: DC
  Administered 2019-06-23 (×2): 160 mg via ORAL
  Filled 2019-06-23 (×3): qty 2

## 2019-06-23 MED ORDER — ALPRAZOLAM 0.5 MG PO TABS: 0.5000 mg | ORAL_TABLET | Freq: Three times a day (TID) | ORAL | 0 refills | Status: DC

## 2019-06-23 MED ORDER — FREE WATER: 200.0000 mL | Freq: Four times a day (QID) | Status: DC

## 2019-06-23 MED ORDER — ALPRAZOLAM 1 MG PO TABS: 1.0000 mg | ORAL_TABLET | Freq: Two times a day (BID) | ORAL | 0 refills | Status: DC | PRN

## 2019-06-23 NOTE — Discharge Summary (Addendum)
Physician Discharge Summary  Kyle [name] [id number] DOB: Oct 15, 1955 DOA: 06/08/2019  PCP: [name], MD  Admit date: 06/08/2019 Discharge date: 06/23/2019  Time spent: 65 minutes  Recommendations for Outpatient Follow-up:  1. Patient with discharge to skilled nursing facility. 2. Follow-up with MD at skilled nursing facility.  Patient will need a enoxaparin Xa level drawn on Monday, 06/26/2019 4 hours after Lovenox dose is given.  Patient also need a basic metabolic profile done and a CBC done in 1 week. 3. Follow-up with palliative care at skilled nursing facility.   Discharge Diagnoses:  Principal Problem:   Aspiration into airway Active Problems:   Adjustment disorder with mixed anxiety and depressed mood   Essential hypertension   Chronic atrial fibrillation (HCC)   Dysphagia due to recent cerebrovascular accident   Aspiration pneumonia (HCC)   Right anterior knee pain   Hematoma of leg, right, subsequent encounter   Hypernatremia   Discharge Condition: Stable and improved  Diet recommendation: Tube feeds  Filed Weights   06/20/19 0457 06/21/19 0500 06/21/19 1339  Weight: (!) 160.7 kg (!) 157.8 kg (!) 157 kg    History of present illness:  HPI per Dr. [name] is a 64 y.o. male with history of atrial fibrillation, hypertension, COPD was recently admitted for acute CVA underwent mechanical thrombectomy for right MCA infarct with patient having dysphagia and difficulty speaking had to undergo PEG tube placement and was discharged to rehab when patient signed out himself [name] and went home and try to eat when he aspirated and became short of breath and EMS was called.  Patient was brought to the ER.  Denied any chest pain shortness of breath abdominal pain or diarrhea.  ED Course: In the ER patient was afebrile Covid test was negative.  Chest x-ray was unremarkable patient was requiring 3 L oxygen.  Patient was having loud  rhonchorous sound on exam and was having a lot of secretion and acid to be constantly aspirated.  Labs show sodium 146 WBC 18.4 hemoglobin 10.7 EKG shows A. fib rate controlled.  Given the shortness of breath with patient requiring 3 L oxygen patient has been admitted for further observation for aspiration.  Patient was started on Unasyn empirically.  Hospital Course:  1 large right thigh hematoma Noted on MRI of the right femur that showed a large intramuscular hematoma and anterior medial right upper leg, superinfection of hematoma possible.  Patient's Lovenox held early on during the hospitalization.  H&H has remained stable.  Prior hospitalist spoke with orthopedics on the phone who recommended conservative management with IV antibiotics, H&H monitoring then no need for surgical intervention.  Zosyn subsequently discontinued on 06/18/2019.  Patient resumed back on heparin with no drop in hemoglobin and no worsening of hematoma.  Patient subsequently transition to full dose Lovenox on day of discharge.  Outpatient follow-up.  2.  Acute respiratory failure with hypoxia secondary to aspiration into the airway/aspiration pneumonia Patient with a history of dysphagia from recent stroke status post PEG tube placement.  Patient noted to have aspirated after attempting to eat at home after eloping from SNF.  Patient status post treatment of empiric IV antibiotics.  SLP reevaluated patient and diet recommendations are still NPO.  Patient not hypoxic.    Patient maintained on PEG tube feedings.  It was felt patient would be unsafe to be discharged home when appropriate for skilled nursing facility which patient will be discharged to.  No further antibiotics needed.  3.  Hypoglycemia Patient has had intermittent persistent hypoglycemia despite being on tube feeds.  Tube feeding subsequently changed on 06/19/2019 by dietary.  CBGs improved with no further hypoglycemic spells.   4.  Hypernatremia Improved with  increasing free water flushes via PEG tube.  5.  Persistent A. fib Patient maintained on metoprolol for rate control during the hospitalization.  Lovenox was initially held secondary to hematoma.  Patient recently discharged to SNF on Lovenox.  H&H remained stable and patient started on IV heparin hemoglobin remained stable hematoma remained stable.  Patient subsequently transition to Lovenox on day of discharge.  Patient be discharged on Lovenox.  6.  Recent thrombotic stroke Status post thrombectomy during recent admission.  Patient still with dysphagia.    Anticoagulation initially held during the hospitalization due to hematoma.  Patient started on IV heparin H&H has remained stable and hematoma stabilized.  Patient subsequently transition to full dose Lovenox which she tolerated.  Patient will need a enoxaparin Xa level drawn on Monday, June 26, 2019 4 hours after his dose of Lovenox.  Patient be discharged to a skilled nursing facility.   7.  Iron deficiency anemia H&H stabilize during the hospitalization.  Outpatient follow-up.  8.  Right knee pain Secondary to degenerative joint disease.  Plain films done were negative for any fracture.    Patient placed on Voltaren gel.   9.  Chronic pain Remained stable on pain regimen.  10.  Morbid obesity  11.  Generalized deconditioning Patient seen by PT and recommended SNF.  Per Dr.Alekh patient agreeable to SNF.  Social work following.  Palliative care following.  Current CODE STATUS has been changed to DNR.   Procedures:  Chest x-ray 06/08/2019, 06/11/2019  Plain films of the right knee 06/10/2019  MRI right femur 06/12/2019  Consultations:  Palliative care.  Discharge Exam: Vitals:   06/23/19 0729 06/23/19 1629  BP: 120/61 (!) 90/59  Pulse: 73 76  Resp: 16 16  Temp: 98.3 F (36.8 C) 98.1 F (36.7 C)  SpO2: 100% 100%    General: NAD Cardiovascular: RRR Respiratory: CTAB anterior lung fields  Discharge  Instructions   Discharge Instructions    Amb Referral to Palliative Care   Complete by: As directed    Call MD for:  difficulty breathing, headache or visual disturbances   Complete by: As directed    Call MD for:  severe uncontrolled pain   Complete by: As directed    Call MD for:  temperature >100.4   Complete by: As directed    Discharge instructions   Complete by: As directed    Need to follow up with PCP and neurologist. PCP can further evaluate foot pain / refer to podiatry. All home medications continued. DO NOT try to eat or drink by mouth. This puts you at risk of choking and getting food/liquid into the lungs which can cause infection and death.   Increase activity slowly   Complete by: As directed    Increase activity slowly   Complete by: As directed    Increase activity slowly   Complete by: As directed      Allergies as of 06/23/2019      Reactions   Vioxx [rofecoxib] Rash      Medication List    STOP taking these medications   enoxaparin 150 MG/ML injection Commonly known as: LOVENOX Replaced by: enoxaparin 300 MG/3ML Soln injection     TAKE these medications   albuterol 108 (90 Base) MCG/ACT inhaler  Commonly known as: VENTOLIN HFA Inhale 2 puffs into the lungs every 4 (four) hours as needed for wheezing or shortness of breath.   ALPRAZolam 0.5 MG tablet Commonly known as: XANAX Place 1 tablet (0.5 mg total) into feeding tube 3 (three) times daily.   ALPRAZolam 1 MG tablet Commonly known as: XANAX Take 1 tablet (1 mg total) by mouth 2 (two) times daily as needed for anxiety (in addition to scheduled doses for panic attacks and worse symptoms).   cyclobenzaprine 10 MG tablet Commonly known as: FLEXERIL Place 1 tablet (10 mg total) into feeding tube 3 (three) times daily as needed for muscle spasms.   enoxaparin 300 MG/3ML Soln injection Commonly known as: LOVENOX Inject 1.57 mLs (157 mg total) into the skin every 12 (twelve) hours. Replaces:  enoxaparin 150 MG/ML injection   feeding supplement (JEVITY 1.2 CAL) Liqd Place 1,000 mLs into feeding tube continuous.   feeding supplement (PRO-STAT SUGAR FREE 64) Liqd Place 30 mLs into feeding tube 3 (three) times daily.   free water Soln Place 200 mLs into feeding tube every 6 (six) hours. What changed: how much to take   gabapentin 250 MG/5ML solution Commonly known as: NEURONTIN Place 12 mLs (600 mg total) into feeding tube 4 (four) times daily.   glycopyrrolate 1 MG tablet Commonly known as: Robinul Place 0.5 tablets (0.5 mg total) into feeding tube 3 (three) times daily.   metoprolol tartrate 25 mg/10 mL Susp Commonly known as: LOPRESSOR Place 5 mLs (12.5 mg total) into feeding tube 2 (two) times daily.   oxyCODONE 5 MG/5ML solution Commonly known as: ROXICODONE Place 10 mLs (10 mg total) into feeding tube 3 (three) times daily. What changed:   how much to take  when to take this  reasons to take this   pantoprazole sodium 40 mg/20 mL Pack Commonly known as: PROTONIX Place 20 mLs (40 mg total) into feeding tube daily.   sertraline 20 MG/ML concentrated solution Commonly known as: ZOLOFT Place 2.5 mLs (50 mg total) into feeding tube daily.   simethicone 80 MG chewable tablet Commonly known as: MYLICON Place 2 tablets (160 mg total) into feeding tube 4 (four) times daily for 4 days.      Allergies  Allergen Reactions  . Vioxx [Rofecoxib] Rash    Contact information for follow-up providers    Care, [facility] Follow up.   Specialty: Home Health Services Why: Early arranged. They will contact you to schedule visit.  Contact information: [place] [address] [phone]        MD AT SNF Follow up.            Contact information for after-discharge care    Destination    [facility] Preferred SNF .   Service: Skilled Nursing Contact information: [address] [place] [phone]                   The results of significant diagnostics from this hospitalization (including imaging, microbiology, ancillary and laboratory) are listed below for reference.    Significant Diagnostic Studies: DG Chest 2 View  Result Date: 06/11/2019 CLINICAL DATA:  Aspiration EXAM: CHEST - 2 VIEW COMPARISON:  Portable exam 1107 hours compared to 06/08/2019 FINDINGS: Normal heart size, mediastinal contours, and pulmonary vascularity. Atherosclerotic calcification aorta. Peribronchial thickening with slight chronic accentuation of perihilar markings. Medial RIGHT lung base opacity which could represent pneumonia or aspiration. No pleural  effusion or pneumothorax. Scattered endplate spur formation thoracic spine. IMPRESSION: Bronchitic changes with medial RIGHT lung base opacity question pneumonia versus aspiration. Electronically Signed   By: Lavonia Dana M.D.   On: 06/11/2019 11:39   DG Abd 1 View  Result Date: 06/23/2019 CLINICAL DATA:  Abdominal pain. EXAM: ABDOMEN - 1 VIEW COMPARISON:  Chest x-ray 06/11/2019.  Abdomen 09/05/2018. FINDINGS: Catheter noted over the left abdomen. Surgical clips left upper abdomen. Several slightly prominent air-filled loops of small bowel noted. Colonic gas pattern is normal. No free air. Degenerative change thoracolumbar spine. Aortoiliac and visceral atherosclerotic vascular calcification. Bilateral pulmonary interstitial prominence again noted, improved from prior exam. IMPRESSION: 1. Several slightly prominent air-filled small bowel loops. Follow-up exam suggested to demonstrate resolution. Colonic gas pattern is normal. No free air identified. 2. Bilateral pulmonary interstitial prominence again noted, improved from prior exam. Electronically Signed   By: Eleanor   On: 06/23/2019 14:12   MR FRMUR RIGHT WO CONTRAST  Result Date: 06/13/2019 CLINICAL DATA:  Right upper leg swelling in a patient who is on Lovenox.  Decreased hemoglobin. The patient suffered a fall approximately 3 weeks ago. EXAM: MRI OF THE RIGHT FEMUR WITHOUT CONTRAST TECHNIQUE: Multiplanar, multisequence MR imaging of the right femur was performed. No intravenous contrast was administered. COMPARISON:  Plain films of the right upper leg 06/02/2019. FINDINGS: Only 2 sequences are provided as the patient was unable to tolerate further scanning. Bones/Joint/Cartilage No fracture, contusion or worrisome lesion is identified. Ligaments Negative. Muscles and Tendons There is a large heterogeneous collection most consistent with a hematoma in the musculature of the anterior and medial upper leg which measures approximately 20 cm craniocaudal by 12 cm transverse. The entire extent of the collection cannot be measured as only coronal images are provided. Soft tissues Subcutaneous edema is present. IMPRESSION: Large intramuscular hematoma in the anterior and medial right upper leg. Superinfection of the hematoma is possible. No bony x-rays that no acute bony abnormality. Electronically Signed   By: Inge Rise M.D.   On: 06/13/2019 07:29   IR Replc Duoden/Jejuno Tube Percut W/Fluoro  Result Date: 06/05/2019 INDICATION: Post stroke, with recent surgically placed jejunostomy catheter, became removed. A temporary catheter placed in the tract has also been removed. EXAM: IR REPLACE DUODEN/JEJUNO TUBE PERCUT WITH FLUORO MEDICATIONS: No antibiotics were indicated ANESTHESIA/SEDATION: Viscous lidocaine, topically CONTRAST:  84mL OMNIPAQUE IOHEXOL 300 MG/ML SOLN - administered into the gastric lumen. PROCEDURE: Informed written consent was obtained from the patient after a thorough discussion of the procedural risks, benefits and alternatives. All questions were addressed. Maximal Sterile Barrier Technique was utilized including caps, mask, sterile gowns, sterile gloves, sterile drape, hand hygiene and skin antiseptic. A timeout was performed prior to the initiation  of the procedure. Under fluoroscopic guidance, a short angled Kumpe catheter was advanced through the jejunostomy tract into the small bowel. Contrast injection confirmed appropriate positioning. Catheter exchanged over short Amplatz wire for vascular dilator which facilitated placement 16 French balloon retention single-lumen jejunostomy catheter. Small contrast injection confirmed appropriate positioning and patency. Catheter was flushed and capped. The patient tolerated the procedure well. FLUOROSCOPY TIME:  1.9 minute; 0000000 uGym2 DAP COMPLICATIONS: None immediate. IMPRESSION: 1. Technically successful 16 French single-lumen jejunostomy catheter replacement under fluoroscopy. Electronically Signed   By: Lucrezia Europe M.D.   On: 06/05/2019 13:58   DG Chest Port 1 View  Result Date: 06/08/2019 CLINICAL DATA:  Shortness of breath EXAM: PORTABLE CHEST 1 VIEW COMPARISON:  Portable exam 1759 hours  compared to 06/02/2019 FINDINGS: Rotated to the RIGHT. Normal heart size, mediastinal contours, and pulmonary vascularity. Chronic accentuation of basilar interstitial markings, stable. No definite acute infiltrate, pleural effusion or pneumothorax. Osseous structures unremarkable. IMPRESSION: No acute abnormalities. Electronically Signed   By: Lavonia Dana M.D.   On: 06/08/2019 18:19   DG Knee Complete 4 Views Right  Result Date: 06/10/2019 CLINICAL DATA:  Right knee pain and bruising. EXAM: RIGHT KNEE - COMPLETE 4+ VIEW COMPARISON:  Right femur radiographs 06/02/2019. Knee radiographs 04/21/2019. FINDINGS: No evidence of acute fracture or dislocation. Relative to the distal femur, there is mild lateral subluxation of the tibia, similar to prior radiographs. Moderate tricompartmental degenerative changes are present with joint space narrowing and osteophytes. No significant joint effusion. Soft tissue calcifications are present medially in the proximal lower leg. IMPRESSION: No acute osseous findings. Tricompartmental  degenerative changes with mild lateral subluxation of the tibia, similar to prior radiographs. Electronically Signed   By: Richardean Sale M.D.   On: 06/10/2019 14:12    Microbiology: Recent Results (from the past 240 hour(s))  SARS CORONAVIRUS 2 (TAT 6-24 HRS) Nasopharyngeal Nasopharyngeal Swab     Status: None   Collection Time: 06/20/19  3:57 PM   Specimen: Nasopharyngeal Swab  Result Value Ref Range Status   SARS Coronavirus 2 NEGATIVE NEGATIVE Final    Comment: (NOTE) SARS-CoV-2 target nucleic acids are NOT DETECTED. The SARS-CoV-2 RNA is generally detectable in upper and lower respiratory specimens during the acute phase of infection. Negative results do not preclude SARS-CoV-2 infection, do not rule out co-infections with other pathogens, and should not be used as the sole basis for treatment or other patient management decisions. Negative results must be combined with clinical observations, patient history, and epidemiological information. The expected result is Negative. Fact Sheet for Patients: SugarRoll.be Fact Sheet for Healthcare Providers: https://www.woods-mathews.com/ This test is not yet approved or cleared by the [place] FDA and  has been authorized for detection and/or diagnosis of SARS-CoV-2 by FDA under an Emergency Use Authorization (EUA). This EUA will remain  in effect (meaning this test can be used) for the duration of the COVID-19 declaration under Section 56 4(b)(1) of the Act, 21 U.S.C. section 360bbb-3(b)(1), unless the authorization is terminated or revoked sooner. Performed at [facility], [place] [address] [place], [place] [address]      Labs: Basic Metabolic Panel: Recent Labs  Lab 06/17/19 0235 06/18/19 0711 06/19/19 0931 06/21/19 0256 06/22/19 0432 06/23/19 0235  NA 138 132* 135 135 135 129*  K 3.7 3.8 4.3 3.7 4.0 3.8  CL 105 100 102 100 99 94*  CO2 26 25 26 26 28 26   GLUCOSE 108*  121* 103* 113* 115* 153*  BUN 25* 20 17 16 18 21   CREATININE 0.59* 0.62 0.55* 0.63 0.64 0.75  CALCIUM 8.1* 8.0* 8.1* 8.3* 8.4* 8.3*  MG 1.9 1.8 1.9 1.8  --   --    Liver Function Tests: No results for input(s): AST, ALT, ALKPHOS, BILITOT, PROT, ALBUMIN in the last 168 hours. No results for input(s): LIPASE, AMYLASE in the last 168 hours. No results for input(s): AMMONIA in the last 168 hours. CBC: Recent Labs  Lab 06/17/19 0235 06/18/19 0711 06/19/19 0931 06/21/19 0256 06/22/19 0432 06/23/19 0235  WBC 9.2 8.5 5.7 6.0 5.9 9.0  NEUTROABS 6.6 6.4 3.9 3.7  --   --   HGB 8.6* 8.8* 10.0* 9.1* 9.8* 9.7*  HCT 28.5* 28.0* 31.5* 28.8* 31.0* 31.9*  MCV  97.6 94.3 93.5 94.7 94.2 97.3  PLT 307 296 257 291 312 302   Cardiac Enzymes: No results for input(s): CKTOTAL, CKMB, CKMBINDEX, TROPONINI in the last 168 hours. BNP: BNP (last 3 results) Recent Labs    06/08/19 1855  BNP 82.3    ProBNP (last 3 results) No results for input(s): PROBNP in the last 8760 hours.  CBG: Recent Labs  Lab 06/23/19 0256 06/23/19 0351 06/23/19 0719 06/23/19 1125 06/23/19 1624  GLUCAP 84 84 77 74 95       Signed:  Irine Seal MD.  [facility] 06/23/2019, 5:48 PM

## 2019-06-23 NOTE — Progress Notes (Addendum)
Physical Therapy Treatment Patient Details Name: Kyle [name] MRN: [place]:[id number] DOB: 1956/04/25 Today's Date: 06/23/2019    History of Present Illness Pt is a 64 y.o. male with PMH of afib, HTN, COPD and recent admission for acute CVA underwent mechanical thrombectomy for right MCA infarct, pt with dysphagia requiring peg tube with d/c to SNF, pt apparently left AMA and went home to eat, aspirated and become SOB, readmitted 06/08/19 with aspiration. Pt also wih large R thigh hematoma, ortho recommending conservative tx.   PT Comments    Pt slowly progressing with mobility. Able to perform bed mobility with min guard to minA, maintaining dynamic seating balance without assist at supervision-level. Following majority of simple commands, attempting to verbalize words but more successful with writing. Declined transfer training this session. Pt left seated EOB with SLP for swallow evaluation. Continue to recommend SNF-level therapies to maximize functional mobility and independence.    Follow Up Recommendations  SNF;Supervision/Assistance - 24 hour     Equipment Recommendations  Wheelchair (measurements PT);Wheelchair cushion (measurements PT);Hospital bed;Rolling walker with 5" wheels    Recommendations for Other Services       Precautions / Restrictions Precautions Precautions: Fall Precaution Comments: R thigh hematoma; denies dizziness with sitting this session Restrictions Weight Bearing Restrictions: No    Mobility  Bed Mobility Overal bed mobility: Needs Assistance Bed Mobility: Rolling;Sidelying to Sit Rolling: Supervision Sidelying to sit: Min assist;HOB elevated       General bed mobility comments: Rolling well with use of bedrail; sidelying to sit without physical assist, heavy use of rail; minA to scoot R hip foward at EOB  Transfers                 General transfer comment: Pt declined standing attempts or lateral scoot transfer to recliner; endorses some LE  numbness but denies dizziness  Ambulation/Gait                 Stairs             Wheelchair Mobility    Modified Rankin (Stroke Patients Only)       Balance Overall balance assessment: Needs assistance Sitting-balance support: Feet supported;No upper extremity supported Sitting balance-Leahy Scale: Fair                                      Cognition Arousal/Alertness: Awake/alert   Overall Cognitive Status: Difficult to assess                                 General Comments: Pt attempting to verbalize some words although difficult to understand. Following majority of simple commands appropriately. Able to write "brush" as pt wanted to comb his hair      Exercises      General Comments General comments (skin integrity, edema, etc.): Pt left sitting EOB with SLP for swallow evaluation      Pertinent Vitals/Pain Pain Assessment: No/denies pain    Home Living                      Prior Function            PT Goals (current goals can now be found in the care plan section) Acute Rehab PT Goals Patient Stated Goal: to improve mobility PT Goal Formulation: With patient Time For Goal Achievement:  07/07/19 Potential to Achieve Goals: Fair Progress towards PT goals: Progressing toward goals(slowly)    Frequency    Min 2X/week      PT Plan Frequency needs to be updated    Co-evaluation   Reason for Co-Treatment: Other (comment);Complexity of the patient's impairments (multi-system involvement)(positioning for dysphagia interventions) PT goals addressed during session: Mobility/safety with mobility   SLP goals addressed during session: Swallowing    AM-PAC PT "6 Clicks" Mobility   Outcome Measure  Help needed turning from your back to your side while in a flat bed without using bedrails?: A Little Help needed moving from lying on your back to sitting on the side of a flat bed without using bedrails?: A  Little Help needed moving to and from a bed to a chair (including a wheelchair)?: A Lot Help needed standing up from a chair using your arms (e.g., wheelchair or bedside chair)?: A Lot Help needed to walk in hospital room?: A Lot Help needed climbing 3-5 steps with a railing? : Total 6 Click Score: 13    End of Session   Activity Tolerance: Patient tolerated treatment well Patient left: with call bell/phone within reach;with nursing/sitter in room(seated EOB with SLP) Nurse Communication: Mobility status PT Visit Diagnosis: Difficulty in walking, not elsewhere classified (R26.2);Muscle weakness (generalized) (M62.81)     Time: [id number] PT Time Calculation (min) (ACUTE ONLY): 25 min  Charges:  $Therapeutic Activity: 8-22 mins                    Mabeline Caras, PT, DPT Acute Rehabilitation Services  Pager [phone] Office [place] 06/23/2019, 10:37 AM

## 2019-06-23 NOTE — Progress Notes (Signed)
CSW received call from [place], [place]. She reports that patient does have Medicaid that can be converted to Longterm care if needed. CSW faxed her requested FL2 to submit (f. [phone]). Will pass info on to [facility].   Percell Locus Ivylynn Hoppes LCSW [phone]

## 2019-06-23 NOTE — TOC Transition Note (Addendum)
Transition of Care [facility]) - CM/SW Discharge Note   Patient Details  Name: Kyle [name] MRN: [id number] Date of Birth: 1955/12/02  Transition of Care [facility]) CM/SW Contact:  Sharin Mons, RN Phone Number: Ferol Luz 06/23/2019, 4:23 PM   Clinical Narrative:     Patient will DC to: [facility] Anticipated DC date: 06/23/2019 Family notified: Legrand Como Transport by: Corey Harold   Per MD patient ready for DC today to [facility]. RN, patient, patient's family, and facility notified of DC. Discharge Summary and FL2 sent to facility. RN to call report prior to discharge, [phone]. DC packet on chart. Ambulance transport requested for patient.   RNCM will sign off for now as intervention is no longer needed. Please consult [place] again if new needs arise.  [name] (Relative)     [phone]        Final next level of care: Skilled Nursing Facility Barriers to Discharge: No Barriers Identified   Patient Goals and CMS Choice Patient states their goals for this hospitalization and ongoing recovery are:: Pt requesting to return home CMS Medicare.gov Compare Post Acute Care list provided to:: Patient Choice offered to / list presented to : Patient  Discharge Placement                Patient to be transferred to facility by: [facility]   Patient and family notified of of transfer: 06/09/19  Discharge Plan and Services In-house Referral: Clinical Social Work Discharge Planning Services: CM Consult Post Acute Care Choice: Home Health          DME Arranged: N/A          Social Determinants of Health (SDOH) Interventions     Readmission Risk Interventions No flowsheet data found.

## 2019-06-23 NOTE — Progress Notes (Signed)
  Speech Language Pathology Treatment: Dysphagia  Patient Details Name: Kyle [name] MRN: [id number] DOB: 11/25/1955 Today's Date: 06/23/2019 Time: 0835-0900 SLP Time Calculation (min) (ACUTE ONLY): 25 min  Assessment / Plan / Recommendation Clinical Impression  Pt seen for dysphagia intervention, partial cotreat with PT for adequate positioning during PO trials with SLP. Pt assisted to edge of bed, sitting fully upright. Severe to profound oral deficits persist s/p prior CVA including decreased management of saliva (improved with oral care), inability for labial closure, poor lingual ROM,, poor bolus control, frequent anterior loss of bolus. Attempted strategic placement of bolus right posterior side of oral cavity with head back posture to improve propulsion to hypopharynx however palpable swallow only exhibited in 1/5 trials with ice chips. Teaspoon of nectar thick liquids and puree texture resulted in anterior loss and oral cavity stasis. Pt with delayed weaker cough.  Continue ice chips with oral care TID. Pt still not exhibiting readiness for instrumental assessment given severity of oral deficits.    HPI HPI: 64 y.o. male with history of atrial fibrillation, hypertension, COPD was recently admitted for acute CVA underwent mechanical thrombectomy for right MCA infarct with patient having dysphagia and difficulty speaking had to undergo PEG tube placement and was discharged to rehab when patient signed out himself [name] and went home and try to eat when he aspirated and became short of breath and EMS was called.        SLP Plan   Continued dysphagia intervention        Recommendations  Diet recommendations: NPO;Other(comment)(ice chips following oral care) Liquids provided via: Teaspoon Medication Administration: Via alternative means Supervision: Intermittent supervision to cue for compensatory strategies Compensations: Slow rate;Small sips/bites Postural Changes  and/or Swallow Maneuvers: Seated upright 90 degrees                Oral Care Recommendations: Oral care QID;Oral care prior to ice chip/H20 Follow up Recommendations: Skilled Nursing facility SLP Visit Diagnosis: Dysphagia, oropharyngeal phase (R13.12)       GO                Kyle [name] E Kyle Knight  MA, CCC-SLP  Acute Rehabilitation Services  06/23/2019, 9:08 AM

## 2019-06-23 NOTE — Progress Notes (Signed)
ANTICOAGULATION CONSULT NOTE - Initial Consult  Pharmacy Consult for Heparin Indication: atrial fibrillation and recent thrombotic CVA  Allergies  Allergen Reactions  . Vioxx [Rofecoxib] Rash    Patient Measurements: Height: 6\' 3"  (190.5 cm) Weight: (!) 346 lb 2 oz (157 kg) IBW/kg (Calculated) : 84.5 Heparin Dosing Weight: 121 kg  Vital Signs: Temp: 98.3 F (36.8 C) (01/08 0729) Temp Source: Oral (01/08 0729) BP: 120/61 (01/08 0729) Pulse Rate: 73 (01/08 0729)  Labs: Recent Labs    06/21/19 0256 06/22/19 0432 06/22/19 1139 06/22/19 2305 06/23/19 0235  HGB 9.1* 9.8*  --   --  9.7*  HCT 28.8* 31.0*  --   --  31.9*  PLT 291 312  --   --  302  HEPARINUNFRC  --  0.15* 0.11* 0.42 0.31  CREATININE 0.63 0.64  --   --  0.75    Estimated Creatinine Clearance: 155.7 mL/min (by C-G formula based on SCr of 0.75 mg/dL).  Assessment: 64 year old male with a history of atrial fibrillation and recent CVA requiring mechanical thrombectomy in November. Patient was discharged to rehab and left AMA. Presents this admission s/p aspiration event and shortness of breath as was also found to have large R-thigh hematoma. Per orthopedics, no surgical management of hematoma. Lovenox was initially held on admission (prior dose was 130 mg subcutaneous every 12 hours.  Last dose unknown.   Has been therapeutic on heparin; hematoma improved  Goal of Therapy:  Heparin level 0.3-0.7 units/ml  Monitor platelets by anticoagulation protocol: Yes   Plan:  DC heparin  Convert to enox 1 mg/kg BID Consider levels over weekend (4 hr [facility])  Barth Kirks, PharmD, BCPS, BCCCP Clinical Pharmacist [phone]  Please check AMION for all [place] numbers  06/23/2019 8:15 AM

## 2019-06-24 NOTE — Progress Notes (Signed)
Pt D/C'd to  Nursing facility and was transported by [facility].

## 2019-07-30 ENCOUNTER — Other Ambulatory Visit: Payer: Self-pay

## 2019-07-30 ENCOUNTER — Observation Stay: Payer: Medicare PPO

## 2019-07-30 ENCOUNTER — Inpatient Hospital Stay
Admission: EM | Admit: 2019-07-30 | Discharge: 2019-08-08 | DRG: 603 | Disposition: A | Discharge status: Skilled Nursing Facility | Payer: Medicare PPO | Source: Skilled Nursing Facility | Attending: Hospitalist | Admitting: Hospitalist

## 2019-07-30 ENCOUNTER — Emergency Department: Payer: Medicare PPO

## 2019-07-30 ENCOUNTER — Encounter: Payer: Self-pay | Admitting: Emergency Medicine

## 2019-07-30 DIAGNOSIS — I482 Chronic atrial fibrillation, unspecified: Secondary | ICD-10-CM | POA: Diagnosis present

## 2019-07-30 DIAGNOSIS — J438 Other emphysema: Secondary | ICD-10-CM | POA: Diagnosis present

## 2019-07-30 DIAGNOSIS — I69320 Aphasia following cerebral infarction: Secondary | ICD-10-CM

## 2019-07-30 DIAGNOSIS — K9423 Gastrostomy malfunction: Secondary | ICD-10-CM

## 2019-07-30 DIAGNOSIS — E662 Morbid (severe) obesity with alveolar hypoventilation: Secondary | ICD-10-CM | POA: Diagnosis present

## 2019-07-30 DIAGNOSIS — Z79899 Other long term (current) drug therapy: Secondary | ICD-10-CM

## 2019-07-30 DIAGNOSIS — Y828 Other medical devices associated with adverse incidents: Secondary | ICD-10-CM | POA: Diagnosis not present

## 2019-07-30 DIAGNOSIS — S7011XD Contusion of right thigh, subsequent encounter: Secondary | ICD-10-CM

## 2019-07-30 DIAGNOSIS — I872 Venous insufficiency (chronic) (peripheral): Secondary | ICD-10-CM | POA: Diagnosis present

## 2019-07-30 DIAGNOSIS — I69391 Dysphagia following cerebral infarction: Secondary | ICD-10-CM

## 2019-07-30 DIAGNOSIS — I639 Cerebral infarction, unspecified: Secondary | ICD-10-CM | POA: Diagnosis present

## 2019-07-30 DIAGNOSIS — I10 Essential (primary) hypertension: Secondary | ICD-10-CM | POA: Diagnosis present

## 2019-07-30 DIAGNOSIS — Z20822 Contact with and (suspected) exposure to covid-19: Secondary | ICD-10-CM | POA: Diagnosis present

## 2019-07-30 DIAGNOSIS — R1310 Dysphagia, unspecified: Secondary | ICD-10-CM | POA: Diagnosis present

## 2019-07-30 DIAGNOSIS — R0602 Shortness of breath: Secondary | ICD-10-CM

## 2019-07-30 DIAGNOSIS — G894 Chronic pain syndrome: Secondary | ICD-10-CM | POA: Diagnosis present

## 2019-07-30 DIAGNOSIS — N179 Acute kidney failure, unspecified: Secondary | ICD-10-CM | POA: Diagnosis not present

## 2019-07-30 DIAGNOSIS — Y833 Surgical operation with formation of external stoma as the cause of abnormal reaction of the patient, or of later complication, without mention of misadventure at the time of the procedure: Secondary | ICD-10-CM | POA: Diagnosis not present

## 2019-07-30 DIAGNOSIS — M1990 Unspecified osteoarthritis, unspecified site: Secondary | ICD-10-CM | POA: Diagnosis present

## 2019-07-30 DIAGNOSIS — F4323 Adjustment disorder with mixed anxiety and depressed mood: Secondary | ICD-10-CM | POA: Diagnosis not present

## 2019-07-30 DIAGNOSIS — M79602 Pain in left arm: Secondary | ICD-10-CM | POA: Diagnosis not present

## 2019-07-30 DIAGNOSIS — L03114 Cellulitis of left upper limb: Secondary | ICD-10-CM | POA: Diagnosis not present

## 2019-07-30 DIAGNOSIS — Z6841 Body Mass Index (BMI) 40.0 and over, adult: Secondary | ICD-10-CM

## 2019-07-30 DIAGNOSIS — Z888 Allergy status to other drugs, medicaments and biological substances status: Secondary | ICD-10-CM

## 2019-07-30 DIAGNOSIS — Z8249 Family history of ischemic heart disease and other diseases of the circulatory system: Secondary | ICD-10-CM

## 2019-07-30 DIAGNOSIS — Z7901 Long term (current) use of anticoagulants: Secondary | ICD-10-CM

## 2019-07-30 DIAGNOSIS — L0390 Cellulitis, unspecified: Secondary | ICD-10-CM | POA: Diagnosis present

## 2019-07-30 DIAGNOSIS — Z801 Family history of malignant neoplasm of trachea, bronchus and lung: Secondary | ICD-10-CM

## 2019-07-30 DIAGNOSIS — M7989 Other specified soft tissue disorders: Secondary | ICD-10-CM

## 2019-07-30 DIAGNOSIS — Z87891 Personal history of nicotine dependence: Secondary | ICD-10-CM

## 2019-07-30 DIAGNOSIS — Z79891 Long term (current) use of opiate analgesic: Secondary | ICD-10-CM

## 2019-07-30 DIAGNOSIS — K219 Gastro-esophageal reflux disease without esophagitis: Secondary | ICD-10-CM | POA: Diagnosis present

## 2019-07-30 DIAGNOSIS — Z8261 Family history of arthritis: Secondary | ICD-10-CM

## 2019-07-30 DIAGNOSIS — Z808 Family history of malignant neoplasm of other organs or systems: Secondary | ICD-10-CM

## 2019-07-30 DIAGNOSIS — X58XXXD Exposure to other specified factors, subsequent encounter: Secondary | ICD-10-CM | POA: Diagnosis present

## 2019-07-30 DIAGNOSIS — Z66 Do not resuscitate: Secondary | ICD-10-CM | POA: Diagnosis present

## 2019-07-30 DIAGNOSIS — D649 Anemia, unspecified: Secondary | ICD-10-CM | POA: Diagnosis present

## 2019-07-30 LAB — CBC WITH DIFFERENTIAL/PLATELET
Abs Immature Granulocytes: 0.03 10*3/uL (ref 0.00–0.07)
Basophils Absolute: 0 10*3/uL (ref 0.0–0.1)
Basophils Relative: 0 %
Eosinophils Absolute: 0.4 10*3/uL (ref 0.0–0.5)
Eosinophils Relative: 6 %
HCT: 24.9 % — ABNORMAL LOW (ref 39.0–52.0)
Hemoglobin: 7.8 g/dL — ABNORMAL LOW (ref 13.0–17.0)
Immature Granulocytes: 1 %
Lymphocytes Relative: 16 %
Lymphs Abs: 1 10*3/uL (ref 0.7–4.0)
MCH: 29.4 pg (ref 26.0–34.0)
MCHC: 31.3 g/dL (ref 30.0–36.0)
MCV: 94 fL (ref 80.0–100.0)
Monocytes Absolute: 0.7 10*3/uL (ref 0.1–1.0)
Monocytes Relative: 11 %
Neutro Abs: 4.1 10*3/uL (ref 1.7–7.7)
Neutrophils Relative %: 66 %
Platelets: 217 10*3/uL (ref 150–400)
RBC: 2.65 MIL/uL — ABNORMAL LOW (ref 4.22–5.81)
RDW: 15.9 % — ABNORMAL HIGH (ref 11.5–15.5)
WBC: 6.1 10*3/uL (ref 4.0–10.5)
nRBC: 0 % (ref 0.0–0.2)

## 2019-07-30 LAB — COMPREHENSIVE METABOLIC PANEL
ALT: 21 U/L (ref 0–44)
AST: 20 U/L (ref 15–41)
Albumin: 2.9 g/dL — ABNORMAL LOW (ref 3.5–5.0)
Alkaline Phosphatase: 59 U/L (ref 38–126)
Anion gap: 6 (ref 5–15)
BUN: 36 mg/dL — ABNORMAL HIGH (ref 8–23)
CO2: 27 mmol/L (ref 22–32)
Calcium: 8.8 mg/dL — ABNORMAL LOW (ref 8.9–10.3)
Chloride: 103 mmol/L (ref 98–111)
Creatinine, Ser: 1.99 mg/dL — ABNORMAL HIGH (ref 0.61–1.24)
GFR calc Af Amer: 41 mL/min — ABNORMAL LOW (ref >60)
GFR calc non Af Amer: 35 mL/min — ABNORMAL LOW (ref >60)
Glucose, Bld: 95 mg/dL (ref 70–99)
Potassium: 4.5 mmol/L (ref 3.5–5.1)
Sodium: 136 mmol/L (ref 135–145)
Total Bilirubin: 0.5 mg/dL (ref 0.3–1.2)
Total Protein: 7.3 g/dL (ref 6.5–8.1)

## 2019-07-30 LAB — RETICULOCYTES
Immature Retic Fract: 17.5 % — ABNORMAL HIGH (ref 2.3–15.9)
RBC.: 2.88 MIL/uL — ABNORMAL LOW (ref 4.22–5.81)
Retic Count, Absolute: 63.6 10*3/uL (ref 19.0–186.0)
Retic Ct Pct: 2.2 % (ref 0.4–3.1)

## 2019-07-30 LAB — IRON AND TIBC
Iron: 42 ug/dL — ABNORMAL LOW (ref 45–182)
Saturation Ratios: 14 % — ABNORMAL LOW (ref 17.9–39.5)
TIBC: 304 ug/dL (ref 250–450)
UIBC: 262 ug/dL

## 2019-07-30 LAB — LACTIC ACID, PLASMA
Lactic Acid, Venous: 0.6 mmol/L (ref 0.5–1.9)
Lactic Acid, Venous: 0.8 mmol/L (ref 0.5–1.9)

## 2019-07-30 LAB — TYPE AND SCREEN
ABO/RH(D): O POS
Antibody Screen: NEGATIVE

## 2019-07-30 LAB — FERRITIN: Ferritin: 129 ng/mL (ref 24–336)

## 2019-07-30 LAB — RESPIRATORY PANEL BY RT PCR (FLU A&B, COVID)
Influenza A by PCR: NEGATIVE
Influenza B by PCR: NEGATIVE
SARS Coronavirus 2 by RT PCR: NEGATIVE

## 2019-07-30 LAB — C-REACTIVE PROTEIN: CRP: 8.6 mg/dL — ABNORMAL HIGH (ref <1.0)

## 2019-07-30 LAB — PROTIME-INR
INR: 1.1 (ref 0.8–1.2)
Prothrombin Time: 14.4 seconds (ref 11.4–15.2)

## 2019-07-30 LAB — VITAMIN B12: Vitamin B-12: 310 pg/mL (ref 180–914)

## 2019-07-30 LAB — FOLATE: Folate: 19.8 ng/mL (ref >5.9)

## 2019-07-30 LAB — SEDIMENTATION RATE: Sed Rate: 128 mm/hr — ABNORMAL HIGH (ref 0–20)

## 2019-07-30 LAB — APTT: aPTT: 58 seconds — ABNORMAL HIGH (ref 24–36)

## 2019-07-30 IMAGING — CT CT ANGIO EXTREM UP*L*
2 of 6 series · 15 of 46 positions shown, 17 images · IV contrast (APPLIED)
Comparison: Venous ultrasound obtained on the same day.

CLINICAL DATA: Left arm pain

EXAM:
CT ANGIOGRAPHY OF THE LEFT UPPEREXTREMITY
TECHNIQUE: Multidetector CT imaging of the left upper extremitywas performed
using the standard protocol during bolus administration of
intravenous contrast. Multiplanar CT image reconstructions and MIPs
were obtained to evaluate the vascular anatomy.
CONTRAST:  100mL OMNIPAQUE IOHEXOL 350 MG/ML SOLN

[Series 5: axial arterial · axial · arterial · 1.27mm/px · z∈[-1148,-536]mm · 12 of 236 slices shown, 14 images]
[im 16/236  soft-tissue]
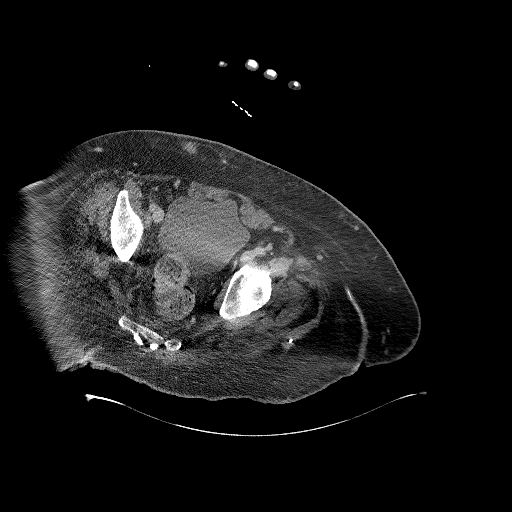
[im 16/236  bone]
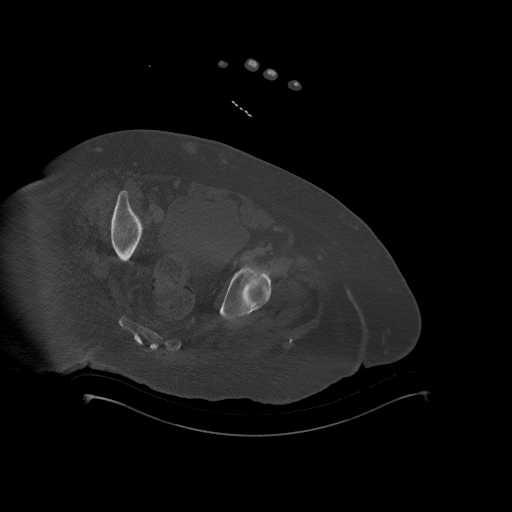
[im 32/236  soft-tissue]
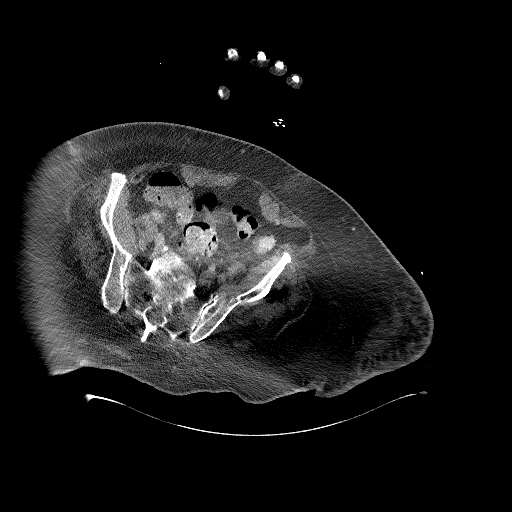
[im 55/236  soft-tissue]
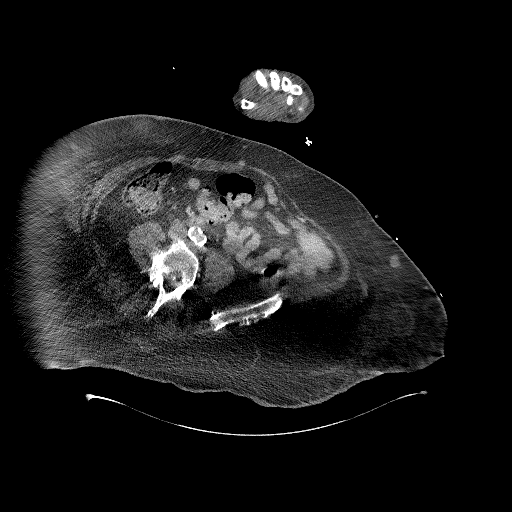
[im 71/236  soft-tissue]
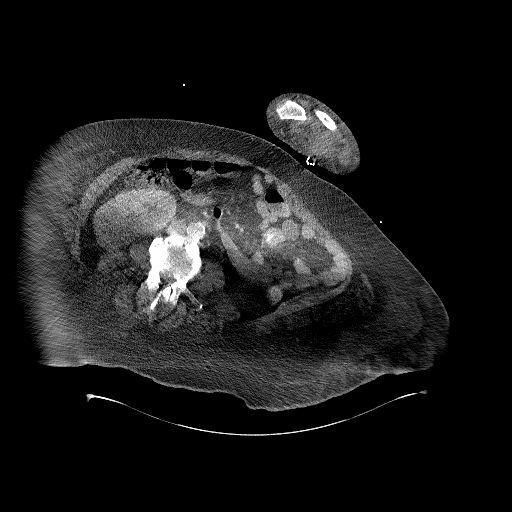
[im 87/236  soft-tissue]
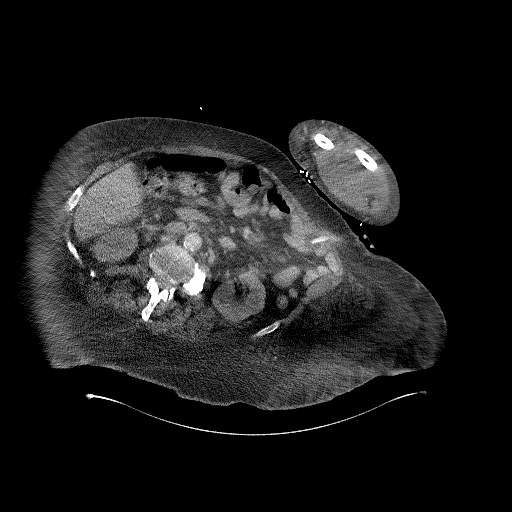
[im 110/236  soft-tissue]
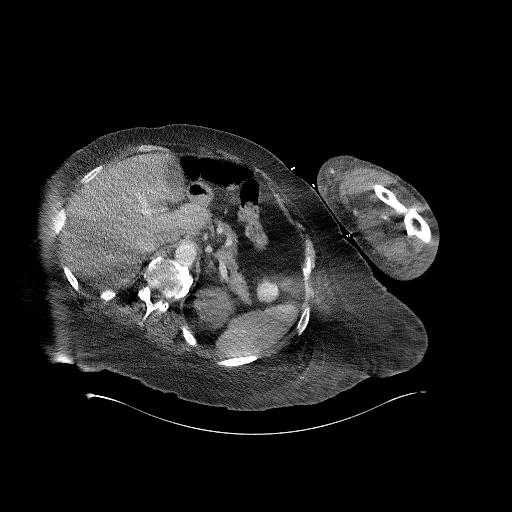
[im 126/236  soft-tissue]
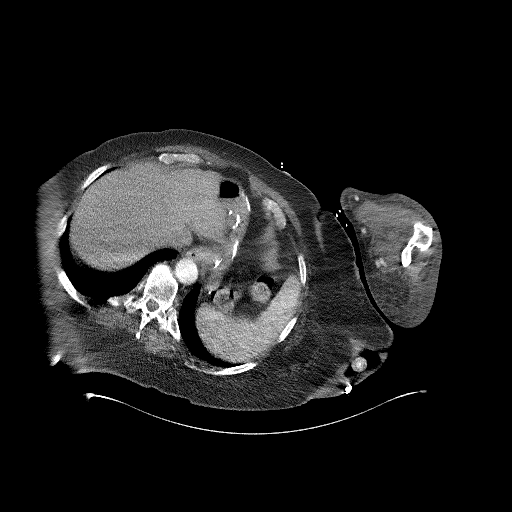
[im 149/236  soft-tissue]
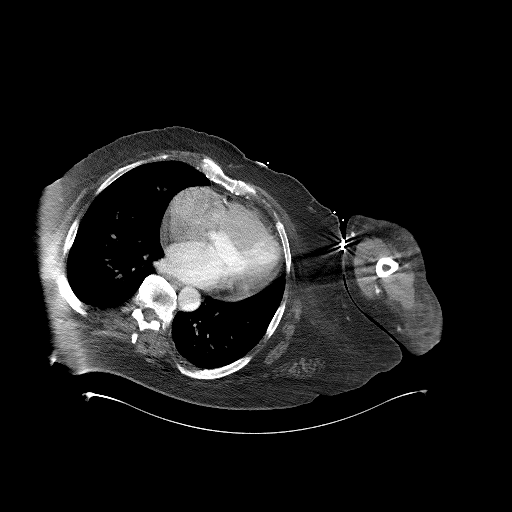
[im 165/236  soft-tissue]
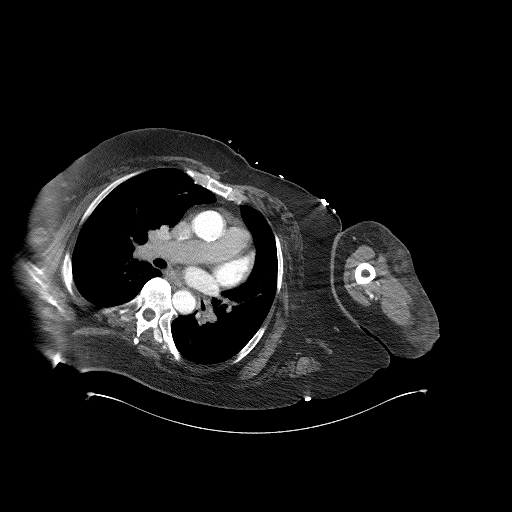
[im 165/236  bone]
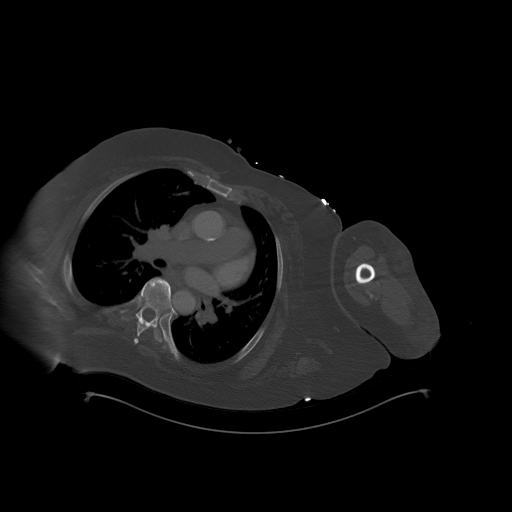
[im 181/236  soft-tissue]
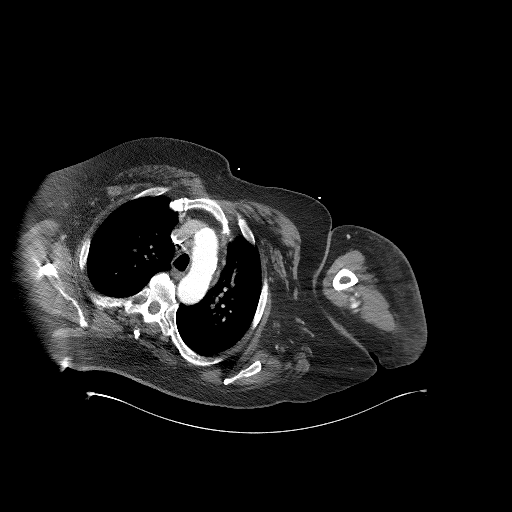
[im 204/236  soft-tissue]
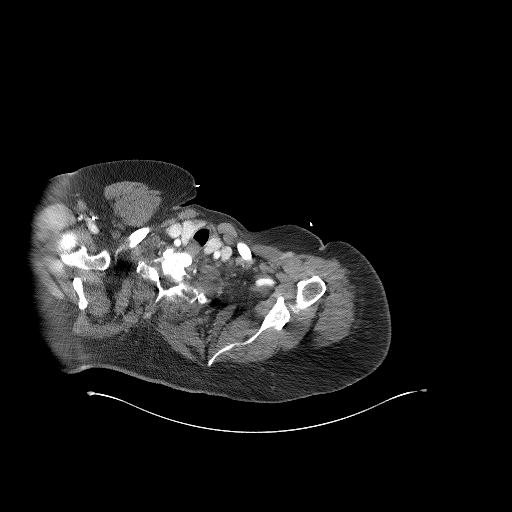
[im 220/236  soft-tissue]
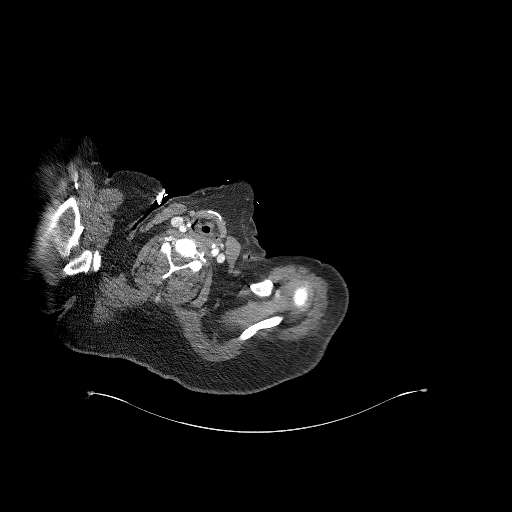

[Series 7: coronals · coronal · 0.95mm/px · 3 of 227 slices shown]
[im 57/227  soft-tissue]
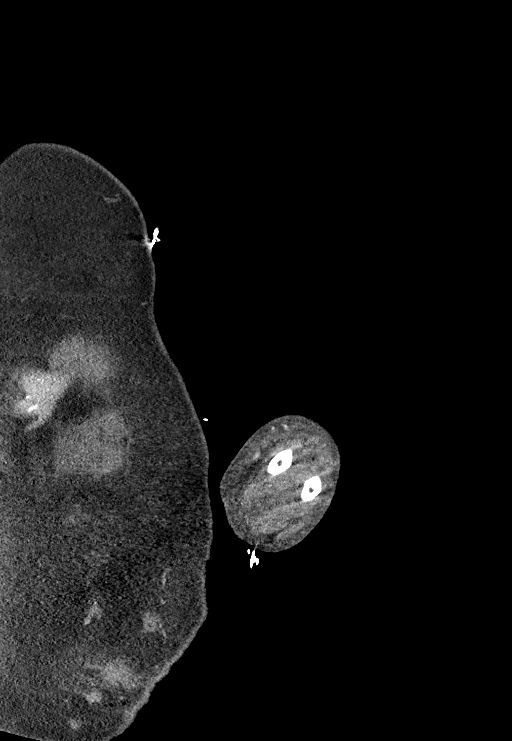
[im 114/227  soft-tissue]
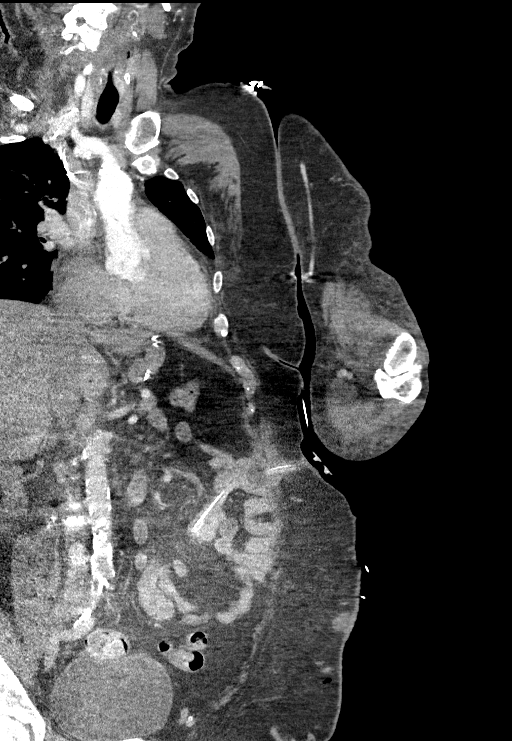
[im 170/227  soft-tissue]
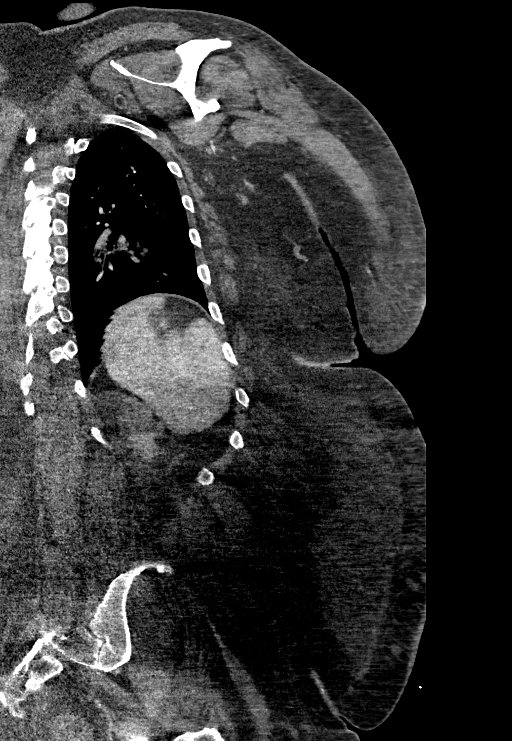

[15 of 46 positions shown; findings below may reference images not displayed]

FINDINGS: Vascular: Thoracic aorta demonstrates atherosclerotic calcifications
without aneurysmal dilatation or dissection. No cardiac enlargement
is seen. No abdominal aneurysmal dilatation is noted. The origins of
the brachiocephalic vessels are patent with mild atherosclerotic
calcifications. The visualized portions of the carotid arteries and
right subclavian artery are within normal limits. The left
subclavian artery demonstrates atherosclerotic calcification
although no focal stenosis is identified. The axillary and brachial
arteries are within normal limits. The brachial bifurcation is
within normal limits although the timing of the contrast bolus
limits evaluation of the radial and ulnar arteries. Comparison with
the recent ultrasound examination shows these to be patent.
Correlation with distal pulses is recommended.

Nonvascular:

The soft tissues of the neck appear within normal limits. Thoracic
inlet is unremarkable. No sizable hilar or mediastinal adenopathy is
noted. The esophagus is within normal limits.

Lungs are well aerated bilaterally without focal infiltrate or
sizable effusion. No definitive parenchymal nodules are seen.

Left upper extremity demonstrates mild edema although no sizable
fluid collection or focal hematoma is identified.

Visualized abdominal structures show no acute abnormality. A
jejunostomy catheter is not seen multiple rounded soft tissue
densities are noted in the subcutaneous fat of the anterior
abdominal wall likely related to injection granulomas. Some
calcified injection granulomas are noted in the left buttock.
Postsurgical changes are noted within the stomach.

Degenerative changes of the thoracolumbar spine are seen. No acute
bony abnormality is noted.

Review of the MIP images confirms the above findings.
IMPRESSION: Vascular: Timing of the contrast bolus limits evaluation of the
radial and ulnar arteries on the left. Comparison with recent
ultrasound shows these to be patent. The remainder of the arterial
structures of the left upper extremity appear within normal limits.
Correlation with distal pulses is recommended.

Nonvascular: Edema is seen within the left arm laterally and
anteriorly although no focal hematoma is noted.

No acute abnormality is seen.

## 2019-07-30 IMAGING — US US EXTREM  UP VENOUS*L*
1 series · 13 of 24 positions shown · non-contrast
Comparison: None.

CLINICAL DATA: Left arm pain and swelling



[Series 1: us extrem up venous*left* · 13 of 36 slices shown]
[im 1/36]
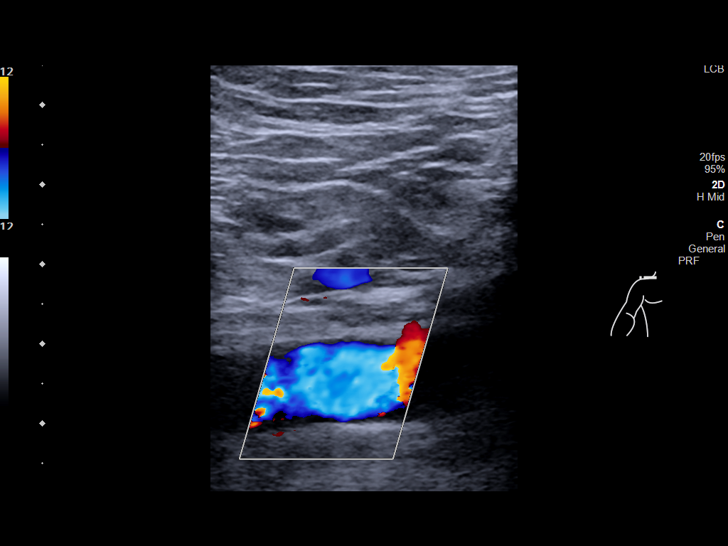
[im 4/36]
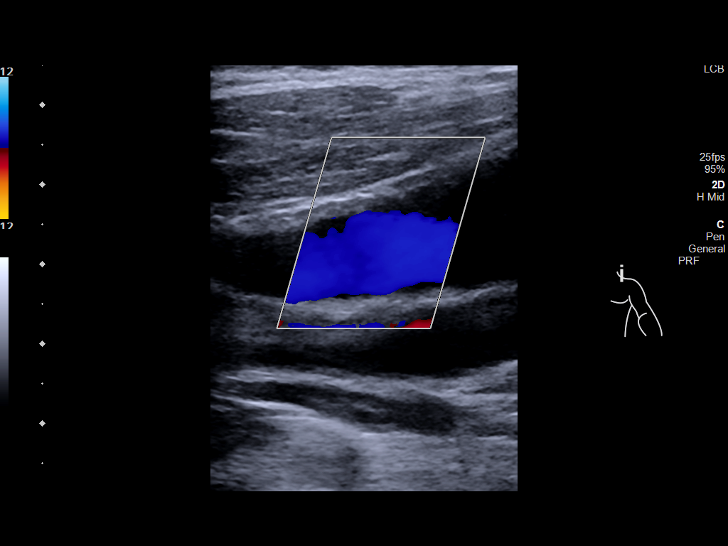
[im 7/36]
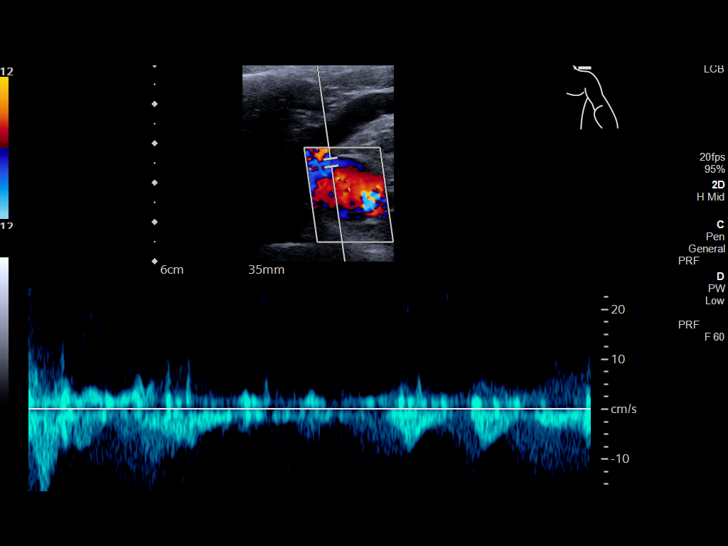
[im 10/36]
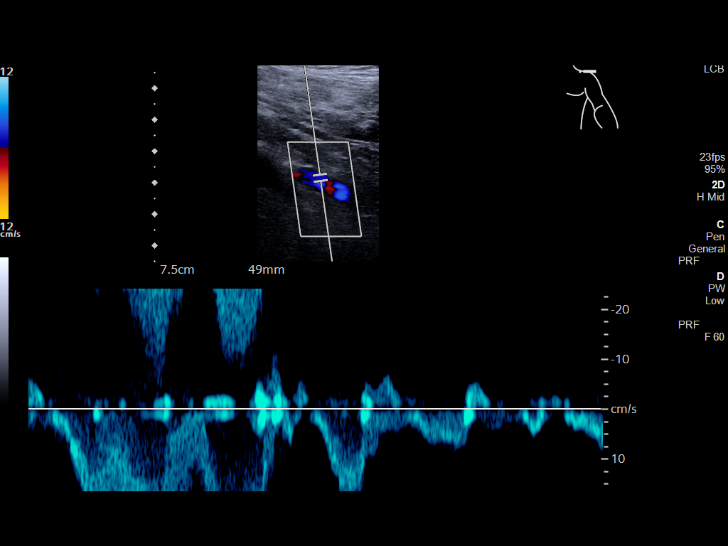
[im 13/36]
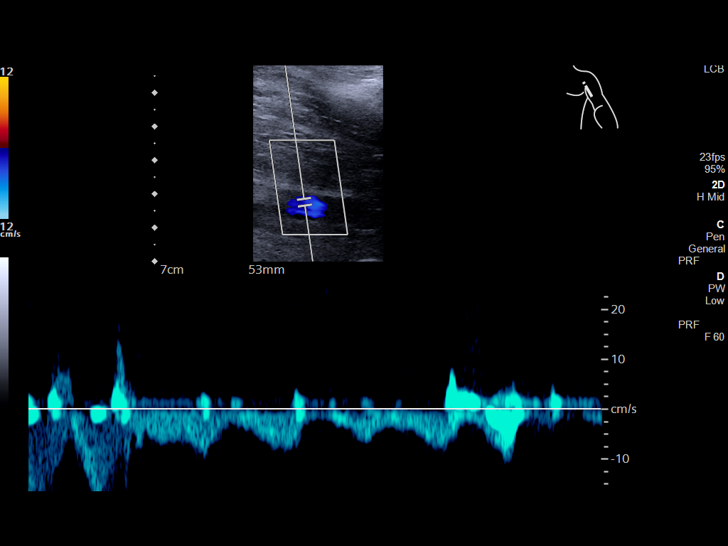
[im 16/36]
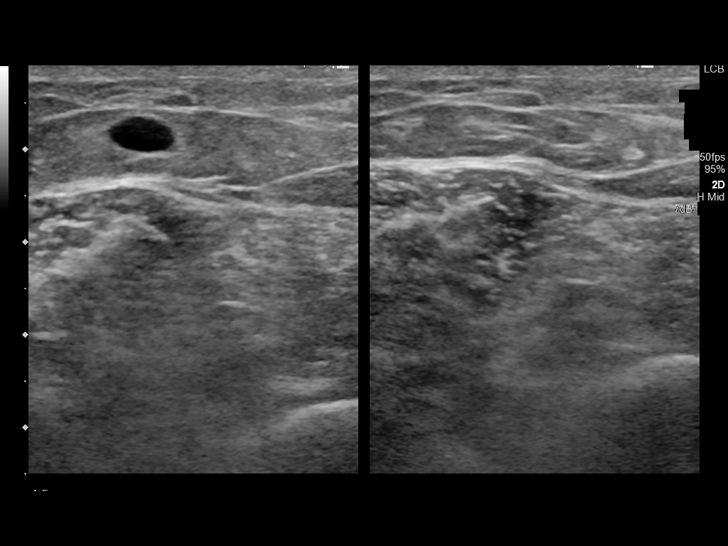
[im 19/36]
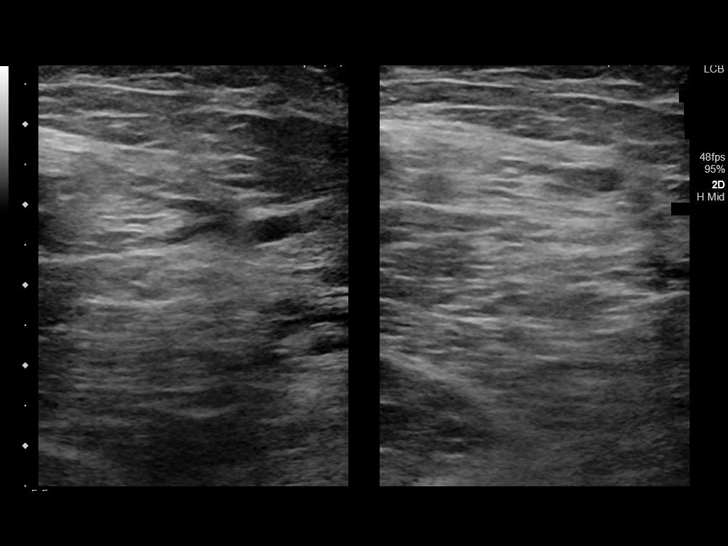
[im 20/36]
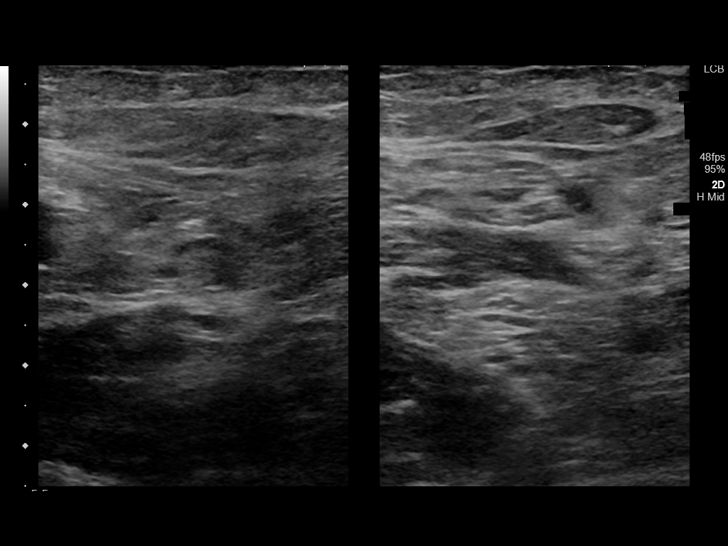
[im 23/36]
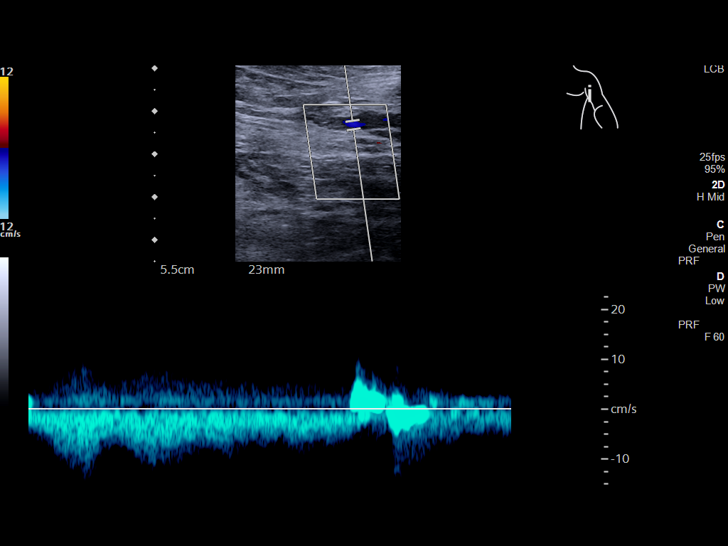
[im 26/36]
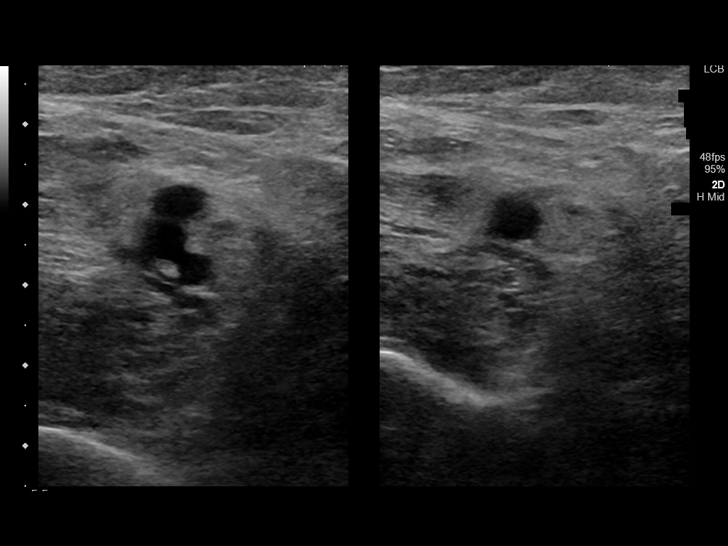
[im 29/36]
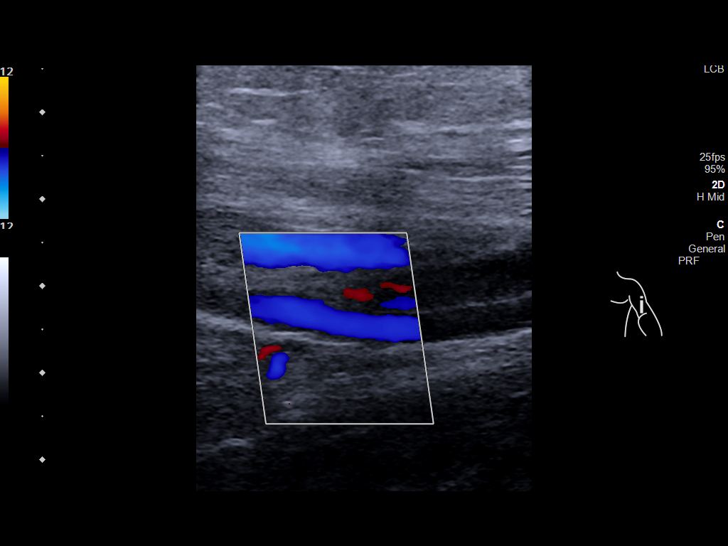
[im 32/36]
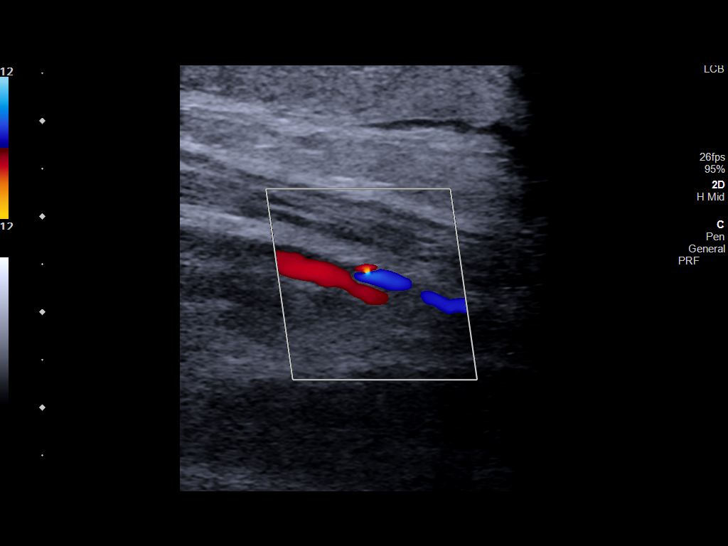
[im 36/36]
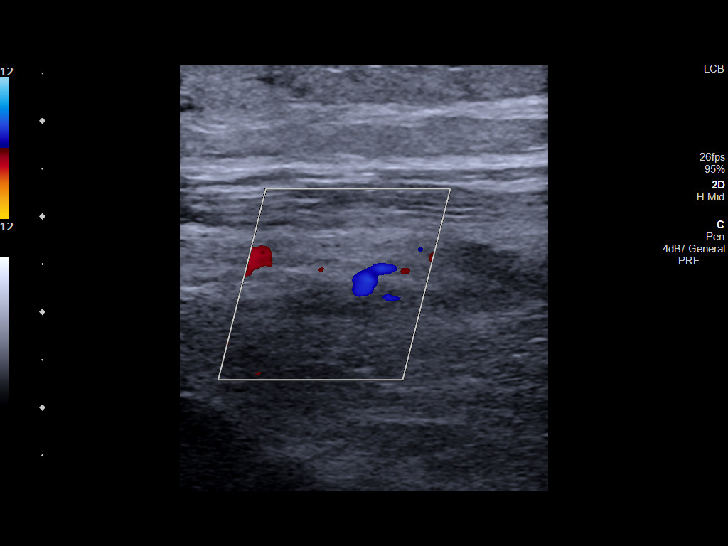

[13 of 24 positions shown; findings below may reference images not displayed]

FINDINGS: Contralateral Subclavian Vein: Respiratory phasicity is normal and
symmetric with the symptomatic side. No evidence of thrombus. Normal
compressibility.

Internal Jugular Vein: No evidence of thrombus. Normal
compressibility, respiratory phasicity and response to augmentation.

Subclavian Vein: No evidence of thrombus. Normal compressibility,
respiratory phasicity and response to augmentation.

Axillary Vein: No evidence of thrombus. Normal compressibility,
respiratory phasicity and response to augmentation.

Cephalic Vein: No evidence of thrombus. Normal compressibility,
respiratory phasicity and response to augmentation.

Basilic Vein: No evidence of thrombus. Normal compressibility,
respiratory phasicity and response to augmentation.

Brachial Veins: No evidence of thrombus. Normal compressibility,
respiratory phasicity and response to augmentation.

Radial Veins: No evidence of thrombus. Normal compressibility,
respiratory phasicity and response to augmentation.

Ulnar Veins: No evidence of thrombus. Normal compressibility,
respiratory phasicity and response to augmentation.

Venous Reflux:  None visualized.

Other Findings:  Mild subcutaneous edema is seen.
IMPRESSION: No evidence of DVT within the left upper extremity.

## 2019-07-30 MED ORDER — SERTRALINE HCL 20 MG/ML PO CONC
20.0000 mg | Freq: Every day | ORAL | Status: DC
  Administered 2019-07-31 – 2019-08-08 (×6): 20 mg
  Filled 2019-07-30 (×10): qty 1

## 2019-07-30 MED ORDER — ALBUTEROL SULFATE HFA 108 (90 BASE) MCG/ACT IN AERS: 2.0000 | INHALATION_SPRAY | RESPIRATORY_TRACT | Status: DC | PRN

## 2019-07-30 MED ORDER — PANTOPRAZOLE SODIUM 40 MG PO PACK
40.0000 mg | PACK | Freq: Every day | ORAL | Status: DC
  Administered 2019-08-01 – 2019-08-08 (×5): 40 mg
  Filled 2019-07-30 (×10): qty 20

## 2019-07-30 MED ORDER — GABAPENTIN 250 MG/5ML PO SOLN
600.0000 mg | Freq: Four times a day (QID) | ORAL | Status: DC
  Administered 2019-07-30 – 2019-08-08 (×23): 600 mg
  Filled 2019-07-30 (×44): qty 12

## 2019-07-30 MED ORDER — MORPHINE SULFATE (PF) 2 MG/ML IV SOLN: 2.0000 mg | INTRAVENOUS | Status: DC | PRN

## 2019-07-30 MED ORDER — ACETAMINOPHEN 325 MG PO TABS: 650.0000 mg | ORAL_TABLET | Freq: Four times a day (QID) | ORAL | Status: DC | PRN

## 2019-07-30 MED ORDER — CYCLOBENZAPRINE HCL 10 MG PO TABS
10.0000 mg | ORAL_TABLET | Freq: Three times a day (TID) | ORAL | Status: DC | PRN
  Administered 2019-08-03: 02:00:00 10 mg
  Filled 2019-07-30: qty 1

## 2019-07-30 MED ORDER — ENOXAPARIN SODIUM 300 MG/3ML IJ SOLN
150.0000 mg | Freq: Two times a day (BID) | INTRAMUSCULAR | Status: DC
  Filled 2019-07-30 (×3): qty 1.5

## 2019-07-30 MED ORDER — SIMETHICONE 80 MG PO CHEW
160.0000 mg | CHEWABLE_TABLET | Freq: Four times a day (QID) | ORAL | Status: DC
  Administered 2019-07-30 – 2019-07-31 (×3): 160 mg
  Filled 2019-07-30 (×7): qty 2

## 2019-07-30 MED ORDER — IOHEXOL 350 MG/ML SOLN
100.0000 mL | Freq: Once | INTRAVENOUS | Status: AC | PRN
  Administered 2019-07-30: 100 mL via INTRAVENOUS

## 2019-07-30 MED ORDER — DIPHENHYDRAMINE HCL 25 MG PO CAPS: 25.0000 mg | ORAL_CAPSULE | ORAL | Status: DC | PRN

## 2019-07-30 MED ORDER — OXYCODONE-ACETAMINOPHEN 5-325 MG PO TABS
1.0000 | ORAL_TABLET | ORAL | Status: DC | PRN
  Administered 2019-08-01 – 2019-08-02 (×3): 1 via ORAL
  Filled 2019-07-30 (×3): qty 1

## 2019-07-30 MED ORDER — ALPRAZOLAM 0.5 MG PO TABS
1.0000 mg | ORAL_TABLET | Freq: Two times a day (BID) | ORAL | Status: DC | PRN
  Administered 2019-07-30 – 2019-08-01 (×2): 1 mg via ORAL
  Filled 2019-07-30 (×2): qty 2

## 2019-07-30 MED ORDER — ALBUTEROL SULFATE (2.5 MG/3ML) 0.083% IN NEBU: 2.5000 mg | INHALATION_SOLUTION | RESPIRATORY_TRACT | Status: DC | PRN

## 2019-07-30 MED ORDER — METOPROLOL TARTRATE 25 MG PO TABS
12.5000 mg | ORAL_TABLET | Freq: Two times a day (BID) | ORAL | Status: DC
  Administered 2019-07-30 – 2019-08-08 (×13): 12.5 mg
  Filled 2019-07-30 (×15): qty 1

## 2019-07-30 MED ORDER — NYSTATIN 100000 UNIT/ML MT SUSP
1.0000 mL | Freq: Three times a day (TID) | OROMUCOSAL | Status: DC
  Administered 2019-07-30 – 2019-08-08 (×11): 100000 [IU] via ORAL
  Filled 2019-07-30 (×17): qty 5

## 2019-07-30 MED ORDER — MORPHINE SULFATE (PF) 2 MG/ML IV SOLN
2.0000 mg | INTRAVENOUS | Status: DC | PRN
  Administered 2019-07-31 – 2019-08-01 (×6): 2 mg via INTRAVENOUS
  Filled 2019-07-30 (×7): qty 1

## 2019-07-30 MED ORDER — METOPROLOL TARTRATE 25 MG/10 ML ORAL SUSPENSION: 12.5000 mg | Freq: Two times a day (BID) | ORAL | Status: DC

## 2019-07-30 MED ORDER — ONDANSETRON HCL 4 MG/2ML IJ SOLN: 4.0000 mg | Freq: Three times a day (TID) | INTRAMUSCULAR | Status: DC | PRN

## 2019-07-30 MED ORDER — HYDROMORPHONE HCL 1 MG/ML IJ SOLN
0.5000 mg | Freq: Once | INTRAMUSCULAR | Status: AC
  Administered 2019-07-30: 0.5 mg via INTRAVENOUS
  Filled 2019-07-30: qty 1

## 2019-07-30 MED ORDER — SODIUM CHLORIDE 0.9 % IV BOLUS
1000.0000 mL | Freq: Once | INTRAVENOUS | Status: AC
  Administered 2019-07-30: 1000 mL via INTRAVENOUS

## 2019-07-30 MED ORDER — VANCOMYCIN HCL 10 G IV SOLR
2500.0000 mg | Freq: Once | INTRAVENOUS | Status: AC
  Administered 2019-07-31: 01:00:00 2500 mg via INTRAVENOUS
  Filled 2019-07-30: qty 2500

## 2019-07-30 MED ORDER — SODIUM CHLORIDE 0.9 % IV SOLN
2.0000 g | Freq: Every day | INTRAVENOUS | Status: DC
  Administered 2019-07-30 – 2019-08-04 (×6): 2 g via INTRAVENOUS
  Filled 2019-07-30 (×3): qty 2
  Filled 2019-07-30 (×2): qty 20
  Filled 2019-07-30 (×2): qty 2

## 2019-07-30 MED ORDER — FREE WATER
200.0000 mL | Freq: Four times a day (QID) | Status: DC
  Administered 2019-07-31 – 2019-08-08 (×22): 200 mL

## 2019-07-30 MED ORDER — ENOXAPARIN SODIUM 150 MG/ML ~~LOC~~ SOLN
150.0000 mg | Freq: Two times a day (BID) | SUBCUTANEOUS | Status: DC
  Administered 2019-07-30 – 2019-08-04 (×10): 150 mg via SUBCUTANEOUS
  Filled 2019-07-30 (×11): qty 1

## 2019-07-30 MED ORDER — ALPRAZOLAM 0.5 MG PO TABS
0.5000 mg | ORAL_TABLET | Freq: Three times a day (TID) | ORAL | Status: DC
  Administered 2019-07-30 – 2019-08-04 (×16): 0.5 mg
  Filled 2019-07-30 (×17): qty 1

## 2019-07-30 MED ORDER — HYDROMORPHONE HCL 1 MG/ML IJ SOLN
0.5000 mg | Freq: Once | INTRAMUSCULAR | Status: AC
  Administered 2019-07-30: 16:00:00 0.5 mg via INTRAVENOUS
  Filled 2019-07-30: qty 1

## 2019-07-30 MED ORDER — PRO-STAT SUGAR FREE PO LIQD
30.0000 mL | Freq: Three times a day (TID) | ORAL | Status: DC
  Administered 2019-07-30 – 2019-07-31 (×2): 30 mL

## 2019-07-30 MED ORDER — HYDRALAZINE HCL 20 MG/ML IJ SOLN
5.0000 mg | INTRAMUSCULAR | Status: DC | PRN
  Administered 2019-08-05: 5 mg via INTRAVENOUS
  Filled 2019-07-30 (×4): qty 0.25

## 2019-07-30 MED ORDER — VANCOMYCIN HCL 1750 MG/350ML IV SOLN
1750.0000 mg | INTRAVENOUS | Status: DC
  Filled 2019-07-30: qty 350

## 2019-07-30 MED ORDER — GLYCOPYRROLATE 1 MG PO TABS
1.0000 mg | ORAL_TABLET | Freq: Two times a day (BID) | ORAL | Status: DC
  Administered 2019-07-30 – 2019-08-08 (×13): 1 mg via ORAL
  Filled 2019-07-30 (×21): qty 1

## 2019-07-30 MED ORDER — SODIUM CHLORIDE 0.9 % IV SOLN: INTRAVENOUS | Status: DC

## 2019-07-30 MED ORDER — JEVITY 1.2 CAL PO LIQD
1000.0000 mL | ORAL | Status: DC
  Administered 2019-07-31: 01:00:00 1000 mL

## 2019-07-30 MED ORDER — ALTEPLASE 2 MG IJ SOLR
2.0000 mg | Freq: Once | INTRAMUSCULAR | Status: AC
  Administered 2019-07-30: 21:00:00 2 mg
  Filled 2019-07-30: qty 2

## 2019-07-30 MED ORDER — HYDROMORPHONE HCL 1 MG/ML IJ SOLN
0.5000 mg | Freq: Once | INTRAMUSCULAR | Status: AC
  Administered 2019-07-30: 13:00:00 0.5 mg via INTRAVENOUS
  Filled 2019-07-30: qty 1

## 2019-07-30 NOTE — ED Notes (Signed)
Report given to Chelsea, RN

## 2019-07-30 NOTE — Progress Notes (Signed)
MRI of LEFT arm unsuccessfully attempted due to size of MRI scanner could not accommodate for patient's body habitus. Admitting MD Blaine Hamper made aware via secure chat.

## 2019-07-30 NOTE — ED Triage Notes (Signed)
Pt arrives from [facility] via [facility] c/o Arm Pain that has gotten progressively worse this week. Pt has PICC line in Rt upper bicep. Pt has J tube in place. Staff told EMS they have not been able to control his pain, he received 0600 0.5 xanax, and 7.5 Oxycodone.

## 2019-07-30 NOTE — H&P (Addendum)
History and Physical    [name] MEQ:[id number] DOB: 27-Jan-1956 DOA: 07/30/2019  Referring MD/NP/PA:   PCP: [name], MD   Patient coming from:  The patient is coming from SNF.  At baseline, pt is dependent for most of ADL.        Chief Complaint: left arm swelling and pain  HPI: Kyle [name] is a 64 y.o. male with medical history significant of hypertension, stroke with aphasia and dysphagia, feeding tube placement, GERD, depression, anxiety, atrial fibrillation, on Lovenox, former smoker, emphysema, who presents with left arm swelling and pain.  Patient has aphasia due to previous stroke, history is limited. Most of the history is obtained by discussing the case with ED physician, per EMS report, and with the nursing staff. Per report, pt has been having left arm swelling and pain in the past several days, which has been progressively worsening. On exam, his left arm has significant swelling and severe tenderness, with purplish and redness color change. It has mild warmth. No chest pain, cough, shortness breath.  No nausea, vomiting, diarrhea or abdominal pain.  ED Course: pt was found to have WBC 6.1, lactic acid of 0.6, 0.8, INR 1.1, negative Covid PCR test, AKI with creatinine 1.99, BUN 36, temperature normal, blood pressure 154/76, heart rate 89, RR 11, oxygen saturation 99% on room air. Ultrasound of the left arm is negative for DVT. CT angiogram of left arm is negative for thrombus formation. Pt is placed on MedSurg bed of observation.     Review of Systems: Could not be reviewed accurately due to aphasia.  Allergy:  Allergies  Allergen Reactions  . Vioxx [Rofecoxib] Rash    Past Medical History:  Diagnosis Date  . Arthritis   . Atrial fibrillation    HX OF SICK SINUS SYNDROME-ATRIAL FIB  . Back pain, chronic    PT STATES 5 BULGING DISKS AND PINCHED NERVES--PT ON OXYCODONE 4 TIMES A DAY FOR HIS PAIN  . Chest pain 08/26/2015  . Chronic airway obstruction, not  elsewhere classified 12/21/2012  . GERD (gastroesophageal reflux disease)   . Hypertension   . Impaired fasting glucose 04/19/2013  . Peripheral vascular disease (HCC)    CHRONIC VENOUS INSUFFICIENCY  . Shortness of breath   . 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 Stroke ([place])   . Wears dentures     Past Surgical History:  Procedure Laterality Date  . ESOPHAGOGASTRODUODENOSCOPY  05/18/2011   Procedure: ESOPHAGOGASTRODUODENOSCOPY (EGD);  Surgeon: Shann Medal, MD;  Location: [facility];  Service: Endoscopy;  Laterality: N/A;  . FOOT SURGERY  2003   right foot surgery from work accident   . GASTRIC BYPASS  10/19/11  . GASTRIC RESTRICTION SURGERY  1992  . IR ANGIO VERTEBRAL SEL SUBCLAVIAN INNOMINATE UNI R MOD SED  04/30/2019  . IR CT HEAD LTD  04/30/2019  . IR PERCUTANEOUS ART THROMBECTOMY/INFUSION INTRACRANIAL INC DIAG ANGIO  04/30/2019  . IR REPLC DUODEN/JEJUNO TUBE PERCUT W/FLUORO  06/05/2019  . LAPAROSCOPIC INSERTION GASTROSTOMY TUBE N/A 05/16/2019   Procedure: LAPAROSCOPIC JEJUNOSTOMY TUBE;  Surgeon: Ralene Ok, MD;  Location: [facility];  Service: General;  Laterality: N/A;  . LEG SURGERY  1998   calcium deposit removed on right  leg   . RADIOLOGY WITH ANESTHESIA N/A 04/30/2019   Procedure: CODE STROKE;  Surgeon: Radiologist, Medication, MD;  Location: [facility];  Service: Radiology;  Laterality:  N/A;    Social History:  reports that he has quit smoking. His smoking use included cigarettes. He has a 20.00 pack-year smoking history. He has never used smokeless tobacco. He reports previous alcohol use. He reports that he does not use drugs.  Family History:  Family History  Problem Relation Age of Onset  . Cancer Mother        melanoma  . Heart disease Mother   . Rheum arthritis Mother   . Lung cancer Father        melanoma  . Heart disease Father   . Heart disease Sister      Prior to Admission  medications   Medication Sig Start Date End Date Taking? Authorizing Provider  albuterol (PROVENTIL HFA;VENTOLIN HFA) 108 (90 Base) MCG/ACT inhaler Inhale 2 puffs into the lungs every 4 (four) hours as needed for wheezing or shortness of breath. 08/30/15  Yes Eulas Post, Monica, DO  ALPRAZolam Duanne Moron) 0.5 MG tablet Place 1 tablet (0.5 mg total) into feeding tube 3 (three) times daily. 06/23/19  Yes Eugenie Filler, MD  ALPRAZolam Duanne Moron) 1 MG tablet Take 1 tablet (1 mg total) by mouth 2 (two) times daily as needed for anxiety (in addition to scheduled doses for panic attacks and worse symptoms). 06/23/19  Yes Eugenie Filler, MD  Amino Acids-Protein Hydrolys (FEEDING SUPPLEMENT, PRO-STAT SUGAR FREE 64,) LIQD Place 30 mLs into feeding tube 3 (three) times daily. 05/19/19  Yes Donzetta Starch, NP  cephALEXin (KEFLEX) 500 MG capsule Take 500 mg by mouth 3 (three) times daily. 07/27/19 08/03/19 Yes [provider]  cyclobenzaprine (FLEXERIL) 10 MG tablet Place 1 tablet (10 mg total) into feeding tube 3 (three) times daily as needed for muscle spasms. 06/10/19  Yes Donnamae Jude, MD  diphenhydrAMINE (BENADRYL) 25 mg capsule Take 25 mg by mouth every 4 (four) hours as needed for itching or allergies.   Yes [provider]  enoxaparin (LOVENOX) 300 MG/3ML SOLN injection Inject 1.57 mLs (157 mg total) into the skin every 12 (twelve) hours. 06/23/19  Yes Eugenie Filler, MD  gabapentin (NEURONTIN) 250 MG/5ML solution Place 12 mLs (600 mg total) into feeding tube 4 (four) times daily. 05/19/19  Yes Donzetta Starch, NP  glycopyrrolate (ROBINUL) 1 MG tablet Take 1 mg by mouth 2 (two) times daily.   Yes [provider]  metoprolol tartrate (LOPRESSOR) 25 mg/10 mL SUSP Place 5 mLs (12.5 mg total) into feeding tube 2 (two) times daily. 05/23/19  Yes Donzetta Starch, NP  Nutritional Supplements (FEEDING SUPPLEMENT, JEVITY 1.2 CAL,) LIQD Place 1,000 mLs into feeding tube continuous. 05/19/19  Yes Donzetta Starch, NP  nystatin (MYCOSTATIN) 100000 UNIT/ML suspension Take 1 mL by mouth 3 (three) times daily. (swab to tongue, cheeks and top of mouth)   Yes [provider]  oxyCODONE (ROXICODONE) 5 MG/5ML solution Place 10 mLs (10 mg total) into feeding tube 3 (three) times daily. Patient taking differently: Place 7.5 mg into feeding tube every 3 (three) hours as needed for severe pain.  06/23/19  Yes Eugenie Filler, MD  pantoprazole sodium (PROTONIX) 40 mg/20 mL PACK Place 20 mLs (40 mg total) into feeding tube daily. 05/19/19  Yes Donzetta Starch, NP  sertraline (ZOLOFT) 20 MG/ML concentrated solution Place 2.5 mLs (50 mg total) into feeding tube daily. Patient taking differently: Place 20 mg into feeding tube daily.  06/10/19  Yes Donnamae Jude, MD  Water For Irrigation, Sterile (FREE WATER) SOLN  Place 200 mLs into feeding tube every 6 (six) hours. 06/23/19  Yes Eugenie Filler, MD  simethicone (MYLICON) 80 MG chewable tablet Place 2 tablets (160 mg total) into feeding tube 4 (four) times daily for 4 days. 06/23/19 06/27/19  Eugenie Filler, MD  rivaroxaban (XARELTO) 20 MG TABS tablet Take 1 tablet (20 mg total) by mouth daily with supper. For blood clot prevention. Patient not taking: Reported on 10/03/2018 08/24/18 01/21/19  Pleas Koch, NP    Physical Exam: Vitals:   07/30/19 1330 07/30/19 1401 07/30/19 1630 07/30/19 1747  BP: (!) 163/140 (!) 153/70 (!) 162/112 (!) 146/91  Pulse: 84 80 89 88  Resp:  _0 Temp:    98.2 F (36.8 C)  TempSrc:    Oral  SpO2: 100% 98% 99% (!) 89%  Weight:    (!) 159.5 kg  Height:    _1  (1.956 m)   General: Not in acute distress HEENT:       Eyes: PERRL, EOMI, no scleral icterus.       ENT: No discharge from the ears and nose, no pharynx injection, no tonsillar enlargement.        Neck: No JVD, no bruit, no mass felt. Heme: No neck lymph node enlargement. Cardiac: S1/S2, RRR, No murmurs, No gallops or rubs. Respiratory: No rales,  wheezing, rhonchi or rubs. GI: Soft, nondistended, nontender, no rebound pain, no organomegaly, BS present. GU: No hematuria Ext: No pitting leg edema bilaterally. 1+DP/PT pulse bilaterally. Has swelling, tenderness, color change and mild warmth in left forearm. Distal pulse intact.  has PICC line in the right upper bicep.  Musculoskeletal: No joint deformities, No joint redness or warmth, no limitation of ROM in spin. Skin: No rashes.  Neuro: Alert, cranial nerves II-XII grossly intact, has aphasia Psych: Patient is not psychotic.  Labs on Admission: I have personally reviewed following labs and imaging studies  CBC: Recent Labs  Lab 07/30/19 0832  WBC 6.1  NEUTROABS 4.1  HGB 7.8*  HCT 24.9*  MCV 94.0  PLT 790   Basic Metabolic Panel: Recent Labs  Lab 07/30/19 0832  NA 136  K 4.5  CL 103  CO2 27  GLUCOSE 95  BUN 36*  CREATININE 1.99*  CALCIUM 8.8*   GFR: Estimated Creatinine Clearance: 64.7 mL/min (A) (by C-G formula based on SCr of 1.99 mg/dL (H)). Liver Function Tests: Recent Labs  Lab 07/30/19 0832  AST 20  ALT 21  ALKPHOS 59  BILITOT 0.5  PROT 7.3  ALBUMIN 2.9*   No results for input(s): LIPASE, AMYLASE in the last 168 hours. No results for input(s): AMMONIA in the last 168 hours. Coagulation Profile: Recent Labs  Lab 07/30/19 0832  INR 1.1   Cardiac Enzymes: No results for input(s): CKTOTAL, CKMB, CKMBINDEX, TROPONINI in the last 168 hours. BNP (last 3 results) No results for input(s): PROBNP in the last 8760 hours. HbA1C: No results for input(s): HGBA1C in the last 72 hours. CBG: No results for input(s): GLUCAP in the last 168 hours. Lipid Profile: No results for input(s): CHOL, HDL, LDLCALC, TRIG, CHOLHDL, LDLDIRECT in the last 72 hours. Thyroid Function Tests: No results for input(s): TSH, T4TOTAL, FREET4, T3FREE, THYROIDAB in the last 72 hours. Anemia Panel: Recent Labs    07/30/19 1824  RETICCTPCT 2.2   Urine analysis:      Component Value Date/Time   COLORURINE YELLOW 06/08/2019 2113   APPEARANCEUR CLEAR 06/08/2019 2113   LABSPEC 1.026 06/08/2019 2113  PHURINE 5.0 06/08/2019 2113   GLUCOSEU NEGATIVE 06/08/2019 2113   HGBUR NEGATIVE 06/08/2019 2113   [place] NEGATIVE 06/08/2019 2113   [place] NEGATIVE 06/08/2019 2113   PROTEINUR 30 (A) 06/08/2019 2113   NITRITE NEGATIVE 06/08/2019 2113   LEUKOCYTESUR NEGATIVE 06/08/2019 2113   Sepsis Labs: _0 (procalcitonin:4,lacticidven:4) ) Recent Results (from the past 240 hour(s))  Respiratory Panel by RT PCR (Flu A&B, Covid) - Nasopharyngeal Swab     Status: None   Collection Time: 07/30/19 10:42 AM   Specimen: Nasopharyngeal Swab  Result Value Ref Range Status   SARS Coronavirus 2 by RT PCR NEGATIVE NEGATIVE Final    Comment: (NOTE) SARS-CoV-2 target nucleic acids are NOT DETECTED. The SARS-CoV-2 RNA is generally detectable in upper respiratoy specimens during the acute phase of infection. The lowest concentration of SARS-CoV-2 viral copies this assay can detect is 131 copies/mL. A negative result does not preclude SARS-Cov-2 infection and should not be used as the sole basis for treatment or other patient management decisions. A negative result may occur with  improper specimen collection/handling, submission of specimen other than nasopharyngeal swab, presence of viral mutation(s) within the areas targeted by this assay, and inadequate number of viral copies (<131 copies/mL). A negative result must be combined with clinical observations, patient history, and epidemiological information. The expected result is Negative. Fact Sheet for Patients:  PinkCheek.be Fact Sheet for Healthcare Providers:  GravelBags.it This test is not yet ap proved or cleared by the [place] FDA and  has been authorized for detection and/or diagnosis of SARS-CoV-2 by FDA under an Emergency Use  Authorization (EUA). This EUA will remain  in effect (meaning this test can be used) for the duration of the COVID-19 declaration under Section 564(b)(1) of the Act, 21 U.S.C. section 360bbb-3(b)(1), unless the authorization is terminated or revoked sooner.    Influenza A by PCR NEGATIVE NEGATIVE Final   Influenza B by PCR NEGATIVE NEGATIVE Final    Comment: (NOTE) The Xpert Xpress SARS-CoV-2/FLU/RSV assay is intended as an aid in  the diagnosis of influenza from Nasopharyngeal swab specimens and  should not be used as a sole basis for treatment. Nasal washings and  aspirates are unacceptable for Xpert Xpress SARS-CoV-2/FLU/RSV  testing. Fact Sheet for Patients: PinkCheek.be Fact Sheet for Healthcare Providers: GravelBags.it This test is not yet approved or cleared by the [place] FDA and  has been authorized for detection and/or diagnosis of SARS-CoV-2 by  FDA under an Emergency Use Authorization (EUA). This EUA will remain  in effect (meaning this test can be used) for the duration of the  Covid-19 declaration under Section 564(b)(1) of the Act, 21  U.S.C. section 360bbb-3(b)(1), unless the authorization is  terminated or revoked. Performed at [facility], [address] [place], [place] [address]      Radiological Exams on Admission: CT ANGIO UP EXTREM LEFT W &/OR WO CONTAST  Result Date: 07/30/2019 CLINICAL DATA:  Left arm pain EXAM: CT ANGIOGRAPHY OF THE LEFT UPPEREXTREMITY TECHNIQUE: Multidetector CT imaging of the left upper extremitywas performed using the standard protocol during bolus administration of intravenous contrast. Multiplanar CT image reconstructions and MIPs were obtained to evaluate the vascular anatomy. CONTRAST:  17m OMNIPAQUE IOHEXOL 350 MG/ML SOLN COMPARISON:  Venous ultrasound obtained on the same day. FINDINGS: Vascular: Thoracic aorta demonstrates atherosclerotic calcifications  without aneurysmal dilatation or dissection. No cardiac enlargement is seen. No abdominal aneurysmal dilatation is noted. The origins of the brachiocephalic vessels are patent with mild atherosclerotic calcifications. The  visualized portions of the carotid arteries and right subclavian artery are within normal limits. The left subclavian artery demonstrates atherosclerotic calcification although no focal stenosis is identified. The axillary and brachial arteries are within normal limits. The brachial bifurcation is within normal limits although the timing of the contrast bolus limits evaluation of the radial and ulnar arteries. Comparison with the recent ultrasound examination shows these to be patent. Correlation with distal pulses is recommended. Nonvascular: The soft tissues of the neck appear within normal limits. Thoracic inlet is unremarkable. No sizable hilar or mediastinal adenopathy is noted. The esophagus is within normal limits. Lungs are well aerated bilaterally without focal infiltrate or sizable effusion. No definitive parenchymal nodules are seen. Left upper extremity demonstrates mild edema although no sizable fluid collection or focal hematoma is identified. Visualized abdominal structures show no acute abnormality. A jejunostomy catheter is not seen multiple rounded soft tissue densities are noted in the subcutaneous fat of the anterior abdominal wall likely related to injection granulomas. Some calcified injection granulomas are noted in the left buttock. Postsurgical changes are noted within the stomach. Degenerative changes of the thoracolumbar spine are seen. No acute bony abnormality is noted. Review of the MIP images confirms the above findings. IMPRESSION: Vascular: Timing of the contrast bolus limits evaluation of the radial and ulnar arteries on the left. Comparison with recent ultrasound shows these to be patent. The remainder of the arterial structures of the left upper extremity appear  within normal limits. Correlation with distal pulses is recommended. Nonvascular: Edema is seen within the left arm laterally and anteriorly although no focal hematoma is noted. No acute abnormality is seen. Electronically Signed   By: Inez Catalina M.D.   On: 07/30/2019 11:00   US Venous Img Upper Uni Left  Result Date: 07/30/2019 CLINICAL DATA:  Left arm pain and swelling EXAM: LEFT UPPER EXTREMITY VENOUS DOPPLER ULTRASOUND TECHNIQUE: Gray-scale sonography with graded compression, as well as color Doppler and duplex ultrasound were performed to evaluate the upper extremity deep venous system from the level of the subclavian vein and including the jugular, axillary, basilic, radial, ulnar and upper cephalic vein. Spectral Doppler was utilized to evaluate flow at rest and with distal augmentation maneuvers. COMPARISON:  None. FINDINGS: Contralateral Subclavian Vein: Respiratory phasicity is normal and symmetric with the symptomatic side. No evidence of thrombus. Normal compressibility. Internal Jugular Vein: No evidence of thrombus. Normal compressibility, respiratory phasicity and response to augmentation. Subclavian Vein: No evidence of thrombus. Normal compressibility, respiratory phasicity and response to augmentation. Axillary Vein: No evidence of thrombus. Normal compressibility, respiratory phasicity and response to augmentation. Cephalic Vein: No evidence of thrombus. Normal compressibility, respiratory phasicity and response to augmentation. Basilic Vein: No evidence of thrombus. Normal compressibility, respiratory phasicity and response to augmentation. Brachial Veins: No evidence of thrombus. Normal compressibility, respiratory phasicity and response to augmentation. Radial Veins: No evidence of thrombus. Normal compressibility, respiratory phasicity and response to augmentation. Ulnar Veins: No evidence of thrombus. Normal compressibility, respiratory phasicity and response to augmentation. Venous  Reflux:  None visualized. Other Findings:  Mild subcutaneous edema is seen. IMPRESSION: No evidence of DVT within the left upper extremity. Electronically Signed   By: Inez Catalina M.D.   On: 07/30/2019 11:01     EKG: Independently reviewed.  Sinus rhythm, QTC 463, poor quality of EKG strip, low voltage.  Assessment/Plan Principal Problem:   Left arm pain Active Problems:   GERD (gastroesophageal reflux disease)   Adjustment disorder with mixed anxiety and depressed mood  Essential hypertension   Chronic atrial fibrillation (HCC)   Stroke (HCC) R MCA emb d/t AF not on AC s/p partial revascularization w/ IR   Dysphagia due to recent cerebrovascular accident   AKI (acute kidney injury) ([place])   Normocytic anemia   Left arm pain: Etiology is not clear.DEP discussed with Dr. Harlow Mares who recommends discussing with [facility] given there is no orthopedic issue at this time. Then EDP dicussed with Dr. Teola Bradley who recommended making sure the subclavian vein did not have thrombosis. CTA of left arm showed mild edema, no sizable fluid collection or focal hematoma is identified. Radial and ulnar arteries on the left are patent.The remainder of the arterial structures of the left upper extremity appear within normal limits. MRI is ordered, but could not be done due to large body habitus.  Since patient has tenderness, swelling, mild warmth and some erythema, potential differential diagnosis is cellulitis.  Will treat empirically for possible cellulitis now.  - will place on tele bed for obs - Empiric antimicrobial treatment with vancomycin and Rocephin - PRN Zofran for nausea, morphine and Percocet for pain - Blood cultures x 2  - ESR and CRP - IVF: 1.0 L of NS bolus in ED, followed by 75 cc/h  GERD (gastroesophageal reflux disease) -protonix  Adjustment disorder with mixed anxiety and depressed mood: -continue home med  HTN:  -Continue home medications: Metoprolol -hydralazine prn  Chronic atrial  fibrillation ([place]): -pt is on full dose of Lovenx  Stroke ([place]) R MCA emb d/t AF not on AC s/p partial revascularization w/ IR: -on Lovenx  Dysphagia due to recent cerebrovascular accident: -Tube feeding -consult Dietician  AKI (acute kidney injury) ([place]): Cre 1.99 and BUN 36.  Likely due to dehydration -IV fluid as above -Check urinalysis  Normocytic anemia: Hgb 9.7 on 06/23/19 -->7.8. -check FOBT -Anemia panel       DVT ppx: on full dose Lovenox Code Status: DNR (pt was DNR before, cannot be discussed that due to aphasia, I tried to call his relatives without success). I will put DNR now. This issue needs to be readdressed in the morning.  Family Communication: I have tried to call his relatives without success Disposition Plan:  Anticipate discharge back to previous SNF Consults called:  None Admission status: Med-surg bed for obs  Date of Service 07/30/2019    [facility]   If 7PM-7AM, please contact night-coverage [email or web address] 07/30/2019, 6:56 PM

## 2019-07-30 NOTE — ED Notes (Signed)
Pt trx to CT.  

## 2019-07-30 NOTE — ED Notes (Signed)
Pt returned from CT and US

## 2019-07-30 NOTE — ED Notes (Signed)
Pt transported to MRI 

## 2019-07-30 NOTE — Progress Notes (Signed)
Dr. Blaine Hamper clarified order patient is able to have a diet and take in food PO.

## 2019-07-30 NOTE — ED Notes (Signed)
Pt still at CT at this time.

## 2019-07-30 NOTE — ED Notes (Signed)
Pt has hx of stroke and communication is limited however he can communicate yes or no and uses arm motions to communicate requests.

## 2019-07-30 NOTE — ED Notes (Signed)
Dr. Blaine Hamper notified patient has PEG tube and is on tube feeds at SNF. Requested to be NPO and have dietitian consult.

## 2019-07-30 NOTE — ED Provider Notes (Signed)
[facility] Emergency Department Provider Note  ____________________________________________   First MD Initiated Contact with Patient 07/30/19 [phone]     (approximate)  I have reviewed the triage vital signs and the nursing notes.   HISTORY  Chief Complaint Arm Pain    HPI Kyle [name] is a 64 y.o. male with atrial fibrillation, history of bulging disc and pinched nerves who comes in for arm pain.  Patient was discharged on 06/23/2019. Patient had an acute CVA and underwent mechanical thrombectomy for right MCA infarct went home [place] and then was readmitted due to aspiration event.  During this admission patient also had a large right thigh hematoma that was managed conservatively with IV antibiotics, H&H monitoring.  Hemoglobins were stable and patient was discharged back on the Lovenox.  [facility] via [facility] due to arm pain.  Patient has a PICC line in the right upper bicep.  Patient has been getting Xanax and oxycodone and have been unable to control his pain.  According to facility patient is had increasing pain on his left lower arm has been going on for a week.  It is also associate with some increasing swelling.  Unable to get full HPI due to patient's aphasia due to prior stroke.          Past Medical History:  Diagnosis Date  . Arthritis   . Atrial fibrillation    HX OF SICK SINUS SYNDROME-ATRIAL FIB  . Back pain, chronic    PT STATES 5 BULGING DISKS AND PINCHED NERVES--PT ON OXYCODONE 4 TIMES A DAY FOR HIS PAIN  . Chest pain 08/26/2015  . Chronic airway obstruction, not elsewhere classified 12/21/2012  . GERD (gastroesophageal reflux disease)   . Hypertension   . Impaired fasting glucose 04/19/2013  . Peripheral vascular disease (HCC)    CHRONIC VENOUS INSUFFICIENCY  . Shortness of breath   . Sleep apnea    UNABLE TO TOLERATE CPAP MASK BECAUSE OF CLAUSTROPHOBIA AND DOES NOT HAVE MASK OR MACHINE AT HOME    . Stroke ([place])   . Wears dentures     Patient Active Problem List   Diagnosis Date Noted  . Right anterior knee pain   . Hematoma of leg, right, subsequent encounter   . Hypernatremia   . Aspiration into airway 06/08/2019  . Aspiration pneumonia ([place])   . Dysphagia due to recent cerebrovascular accident 05/18/2019  . Tobacco use disorder 05/18/2019  . Chronic pain syndrome 05/18/2019  . Stroke ([place]) R MCA emb d/t AF not on [facility] s/p partial revascularization w/ IR 04/30/2019  . Middle cerebral artery embolism, right 04/30/2019  . Chronic atrial fibrillation ([place]) 08/26/2015  . Hyperlipidemia LDL goal <100 06/26/2014  . Routine general medical examination at a health care facility 11/21/2013  . Screening PSA (prostate specific antigen) 11/21/2013  . Special screening for malignant neoplasms, colon 11/21/2013  . OSA (obstructive sleep apnea) 08/22/2013  . Morbid obesity ([place]) 04/19/2013  . Other emphysema ([place]) 01/31/2013  . Essential hypertension 12/21/2012  . Insomnia 12/21/2012  . GERD (gastroesophageal reflux disease) 09/20/2012  . Back pain, chronic 09/20/2012  . Obesity hypoventilation syndrome ([place]) 09/20/2012  . Adjustment disorder with mixed anxiety and depressed mood 09/20/2012    Past Surgical History:  Procedure Laterality Date  . ESOPHAGOGASTRODUODENOSCOPY  05/18/2011   Procedure: ESOPHAGOGASTRODUODENOSCOPY (EGD);  Surgeon: Shann Medal, MD;  Location: [facility];  Service: Endoscopy;  Laterality: N/A;  . FOOT SURGERY  2003  right foot surgery from work accident   . GASTRIC BYPASS  10/19/11  . GASTRIC RESTRICTION SURGERY  1992  . IR ANGIO VERTEBRAL SEL SUBCLAVIAN INNOMINATE UNI R MOD SED  04/30/2019  . IR CT HEAD LTD  04/30/2019  . IR PERCUTANEOUS ART THROMBECTOMY/INFUSION INTRACRANIAL INC DIAG ANGIO  04/30/2019  . IR REPLC DUODEN/JEJUNO TUBE PERCUT W/FLUORO  06/05/2019  . LAPAROSCOPIC INSERTION GASTROSTOMY TUBE N/A 05/16/2019   Procedure: LAPAROSCOPIC  JEJUNOSTOMY TUBE;  Surgeon: Ralene Ok, MD;  Location: [facility];  Service: General;  Laterality: N/A;  . LEG SURGERY  1998   calcium deposit removed on right  leg   . RADIOLOGY WITH ANESTHESIA N/A 04/30/2019   Procedure: CODE STROKE;  Surgeon: Radiologist, Medication, MD;  Location: [facility];  Service: Radiology;  Laterality: N/A;    Prior to Admission medications   Medication Sig Start Date End Date Taking? Authorizing Provider  albuterol (PROVENTIL HFA;VENTOLIN HFA) 108 (90 Base) MCG/ACT inhaler Inhale 2 puffs into the lungs every 4 (four) hours as needed for wheezing or shortness of breath. 08/30/15   Gildardo Cranker, DO  ALPRAZolam Duanne Moron) 0.5 MG tablet Place 1 tablet (0.5 mg total) into feeding tube 3 (three) times daily. 06/23/19   Eugenie Filler, MD  ALPRAZolam Duanne Moron) 1 MG tablet Take 1 tablet (1 mg total) by mouth 2 (two) times daily as needed for anxiety (in addition to scheduled doses for panic attacks and worse symptoms). 06/23/19   Eugenie Filler, MD  Amino Acids-Protein Hydrolys (FEEDING SUPPLEMENT, PRO-STAT SUGAR FREE 64,) LIQD Place 30 mLs into feeding tube 3 (three) times daily. 05/19/19   Donzetta Starch, NP  cyclobenzaprine (FLEXERIL) 10 MG tablet Place 1 tablet (10 mg total) into feeding tube 3 (three) times daily as needed for muscle spasms. 06/10/19   Donnamae Jude, MD  enoxaparin (LOVENOX) 300 MG/3ML SOLN injection Inject 1.57 mLs (157 mg total) into the skin every 12 (twelve) hours. 06/23/19   Eugenie Filler, MD  gabapentin (NEURONTIN) 250 MG/5ML solution Place 12 mLs (600 mg total) into feeding tube 4 (four) times daily. 05/19/19   Donzetta Starch, NP  glycopyrrolate (ROBINUL) 1 MG tablet Place 0.5 tablets (0.5 mg total) into feeding tube 3 (three) times daily. 06/23/19   Eugenie Filler, MD  metoprolol tartrate (LOPRESSOR) 25 mg/10 mL SUSP Place 5 mLs (12.5 mg total) into feeding tube 2 (two) times daily. 05/23/19   Donzetta Starch, NP  Nutritional Supplements (FEEDING  SUPPLEMENT, JEVITY 1.2 CAL,) LIQD Place 1,000 mLs into feeding tube continuous. 05/19/19   Donzetta Starch, NP  oxyCODONE (ROXICODONE) 5 MG/5ML solution Place 10 mLs (10 mg total) into feeding tube 3 (three) times daily. 06/23/19   Eugenie Filler, MD  pantoprazole sodium (PROTONIX) 40 mg/20 mL PACK Place 20 mLs (40 mg total) into feeding tube daily. 05/19/19   Donzetta Starch, NP  sertraline (ZOLOFT) 20 MG/ML concentrated solution Place 2.5 mLs (50 mg total) into feeding tube daily. 06/10/19   Donnamae Jude, MD  simethicone (MYLICON) 80 MG chewable tablet Place 2 tablets (160 mg total) into feeding tube 4 (four) times daily for 4 days. 06/23/19 06/27/19  Eugenie Filler, MD  Water For Irrigation, Sterile (FREE WATER) SOLN Place 200 mLs into feeding tube every 6 (six) hours. 06/23/19   Eugenie Filler, MD  rivaroxaban (XARELTO) 20 MG TABS tablet Take 1 tablet (20 mg total) by mouth daily with supper. For blood clot prevention. Patient not taking:  Reported on 10/03/2018 08/24/18 01/21/19  Pleas Koch, NP    Allergies Vioxx [rofecoxib]  Family History  Problem Relation Age of Onset  . Cancer Mother        melanoma  . Heart disease Mother   . Rheum arthritis Mother   . Lung cancer Father        melanoma  . Heart disease Father   . Heart disease Sister     Social History Social History   Tobacco Use  . Smoking status: Current Every Day Smoker    Packs/day: 0.50    Years: 40.00    Pack years: 20.00    Types: Cigarettes  . Smokeless tobacco: Never Used  Substance Use Topics  . Alcohol use: Yes    Alcohol/week: 0.0 standard drinks    Comment: RARELY  . Drug use: No      Review of Systems Unable to get full review of system due to patient's baseline aphasia. ____________________________________________   PHYSICAL EXAM:  VITAL SIGNS: Blood pressure (!) 156/80, pulse 87, temperature 98.1 F (36.7 C), temperature source Oral, resp. rate 11, height 6\' 5"  (1.956 m), weight  (!) 149.7 kg, SpO2 100 %.  Constitutional: Patient is at baseline, aphasia from his prior stroke Eyes: Conjunctivae are normal.  Pupils are equal size Head: Atraumatic. Nose: No congestion/rhinnorhea. Mouth/Throat: Mucous membranes are moist.   Neck: No stridor. Trachea Midline. FROM Cardiovascular: Normal rate, regular rhythm. Grossly normal heart sounds.  Good peripheral circulation. Respiratory: Normal respiratory effort.  No retractions. Lungs CTAB. Gastrointestinal: Soft and nontender. No distention. No abdominal bruits.  Musculoskeletal: Edema and swelling noted to the lower arm on the left side.  Distal pulse intact.  Arm feels warm to touch.  PICC line on the right side without erythema or swelling. Neurologic: Patient is at baseline with deficits from prior stroke.  Aphasic so difficult to understand Skin:  Skin is warm, dry and intact other than what is noted on the left lower arm. Psychiatric: Unable to fully assess due to his aphasia. GU: Deferred   ____________________________________________   LABS (all labs ordered are listed, but only abnormal results are displayed)  Labs Reviewed  CBC WITH DIFFERENTIAL/PLATELET - Abnormal; Notable for the following components:      Result Value   RBC 2.65 (*)    Hemoglobin 7.8 (*)    HCT 24.9 (*)    RDW 15.9 (*)    All other components within normal limits  COMPREHENSIVE METABOLIC PANEL - Abnormal; Notable for the following components:   BUN 36 (*)    Creatinine, Ser 1.99 (*)    Calcium 8.8 (*)    Albumin 2.9 (*)    GFR calc non Af Amer 35 (*)    GFR calc Af Amer 41 (*)    All other components within normal limits  SEDIMENTATION RATE - Abnormal; Notable for the following components:   Sed Rate 128 (*)    All other components within normal limits  APTT - Abnormal; Notable for the following components:   aPTT 58 (*)    All other components within normal limits  RESPIRATORY PANEL BY RT PCR (FLU A&B, COVID)  LACTIC ACID,  PLASMA  LACTIC ACID, PLASMA  PROTIME-INR  TYPE AND SCREEN   ____________________________________________   ED ECG REPORT I, Vanessa [place], the attending physician, personally viewed and interpreted this ECG.  EKG is atrial fibrillation rate of 90, no ST elevation, no T wave inversions, intervals appear normal although somewhat difficult  to tell due to the poor baseline ____________________________________________  RADIOLOGY   Official radiology report(s): CT ANGIO UP EXTREM LEFT W &/OR WO CONTAST  Result Date: 07/30/2019 CLINICAL DATA:  Left arm pain EXAM: CT ANGIOGRAPHY OF THE LEFT UPPEREXTREMITY TECHNIQUE: Multidetector CT imaging of the left upper extremitywas performed using the standard protocol during bolus administration of intravenous contrast. Multiplanar CT image reconstructions and MIPs were obtained to evaluate the vascular anatomy. CONTRAST:  172mL OMNIPAQUE IOHEXOL 350 MG/ML SOLN COMPARISON:  Venous ultrasound obtained on the same day. FINDINGS: Vascular: Thoracic aorta demonstrates atherosclerotic calcifications without aneurysmal dilatation or dissection. No cardiac enlargement is seen. No abdominal aneurysmal dilatation is noted. The origins of the brachiocephalic vessels are patent with mild atherosclerotic calcifications. The visualized portions of the carotid arteries and right subclavian artery are within normal limits. The left subclavian artery demonstrates atherosclerotic calcification although no focal stenosis is identified. The axillary and brachial arteries are within normal limits. The brachial bifurcation is within normal limits although the timing of the contrast bolus limits evaluation of the radial and ulnar arteries. Comparison with the recent ultrasound examination shows these to be patent. Correlation with distal pulses is recommended. Nonvascular: The soft tissues of the neck appear within normal limits. Thoracic inlet is unremarkable. No sizable hilar or  mediastinal adenopathy is noted. The esophagus is within normal limits. Lungs are well aerated bilaterally without focal infiltrate or sizable effusion. No definitive parenchymal nodules are seen. Left upper extremity demonstrates mild edema although no sizable fluid collection or focal hematoma is identified. Visualized abdominal structures show no acute abnormality. A jejunostomy catheter is not seen multiple rounded soft tissue densities are noted in the subcutaneous fat of the anterior abdominal wall likely related to injection granulomas. Some calcified injection granulomas are noted in the left buttock. Postsurgical changes are noted within the stomach. Degenerative changes of the thoracolumbar spine are seen. No acute bony abnormality is noted. Review of the MIP images confirms the above findings. IMPRESSION: Vascular: Timing of the contrast bolus limits evaluation of the radial and ulnar arteries on the left. Comparison with recent ultrasound shows these to be patent. The remainder of the arterial structures of the left upper extremity appear within normal limits. Correlation with distal pulses is recommended. Nonvascular: Edema is seen within the left arm laterally and anteriorly although no focal hematoma is noted. No acute abnormality is seen. Electronically Signed   By: Inez Catalina M.D.   On: 07/30/2019 11:00   US Venous Img Upper Uni Left  Result Date: 07/30/2019 CLINICAL DATA:  Left arm pain and swelling EXAM: LEFT UPPER EXTREMITY VENOUS DOPPLER ULTRASOUND TECHNIQUE: Gray-scale sonography with graded compression, as well as color Doppler and duplex ultrasound were performed to evaluate the upper extremity deep venous system from the level of the subclavian vein and including the jugular, axillary, basilic, radial, ulnar and upper cephalic vein. Spectral Doppler was utilized to evaluate flow at rest and with distal augmentation maneuvers. COMPARISON:  None. FINDINGS: Contralateral Subclavian Vein:  Respiratory phasicity is normal and symmetric with the symptomatic side. No evidence of thrombus. Normal compressibility. Internal Jugular Vein: No evidence of thrombus. Normal compressibility, respiratory phasicity and response to augmentation. Subclavian Vein: No evidence of thrombus. Normal compressibility, respiratory phasicity and response to augmentation. Axillary Vein: No evidence of thrombus. Normal compressibility, respiratory phasicity and response to augmentation. Cephalic Vein: No evidence of thrombus. Normal compressibility, respiratory phasicity and response to augmentation. Basilic Vein: No evidence of thrombus. Normal compressibility, respiratory phasicity and response  to augmentation. Brachial Veins: No evidence of thrombus. Normal compressibility, respiratory phasicity and response to augmentation. Radial Veins: No evidence of thrombus. Normal compressibility, respiratory phasicity and response to augmentation. Ulnar Veins: No evidence of thrombus. Normal compressibility, respiratory phasicity and response to augmentation. Venous Reflux:  None visualized. Other Findings:  Mild subcutaneous edema is seen. IMPRESSION: No evidence of DVT within the left upper extremity. Electronically Signed   By: Inez Catalina M.D.   On: 07/30/2019 11:01    ____________________________________________   PROCEDURES  Procedure(s) performed (including Critical Care):  Procedures   ____________________________________________   INITIAL IMPRESSION / ASSESSMENT AND PLAN / ED COURSE  Kyle [name] was evaluated in Emergency Department on 07/30/2019 for the symptoms described in the history of present illness. He was evaluated in the context of the global COVID-19 pandemic, which necessitated consideration that the patient might be at risk for infection with the SARS-CoV-2 virus that causes COVID-19. Institutional protocols and algorithms that pertain to the evaluation of patients at risk for COVID-19 are  in a state of rapid change based on information released by regulatory bodies including the CDC and federal and state organizations. These policies and algorithms were followed during the patient's care in the ED.    Patient history is somewhat limited due to his aphasia from prior stroke but patient has significant swelling and some mild warmth on the lower left arm.  Patient is on Lovenox but will get DVT ultrasound to make sure no evidence of blood clot.  Patient has a history of a thigh hematoma so we will get a CT scan to ensure there is no evidence of recurrent hematoma as well as to issue the arterial vasculature looks good which I suspect it will given that he does have a distal pulse.  Will get some infectious markers as well to evaluate for possible infection such as osteomyelitis.  At this time I do not suspect necrotizing fasciitis.  We will try to control pain and reevaluate patient.  Patient is hemoglobin has dropped almost 2 points going down from 9.7-7.8.  This is most concerning for possible recurrent hematoma in the left arm.  White count is normal making infection less likely.  Reevaluated patient continues to have good distal pulse.  Did discuss with Dr. Harlow Mares who recommends discussing with vascular given there is no orthopedic issue at this time.  Dr Dr. Teola Bradley who recommended making sure the subclavian vein did not have a thrombosis.  Discussed with the radiologist who stated that I did not the suggest elevating the arm above the head and continue to monitor the swelling.  We will get an MRI to make sure is no signs of abscess, osteo.  Will discuss with the hospital team for admission.  We will hold off on antibiotics at this time given he is not febrile his white count is normal.  However would have a low threshold to start antibiotics if he does start having a fever.  Discussed with hospital team and they will admit  patient.   ____________________________________________   FINAL CLINICAL IMPRESSION(S) / ED DIAGNOSES   Final diagnoses:  Left arm swelling  AKI (acute kidney injury) ([place])      MEDICATIONS GIVEN DURING THIS VISIT:  Medications  HYDROmorphone (DILAUDID) injection 0.5 mg (0.5 mg Intravenous Given 07/30/19 0911)  sodium chloride 0.9 % bolus 1,000 mL (0 mLs Intravenous Stopped 07/30/19 1148)  iohexol (OMNIPAQUE) 350 MG/ML injection 100 mL (100 mLs Intravenous Contrast Given 07/30/19 1018)  HYDROmorphone (  DILAUDID) injection 0.5 mg (0.5 mg Intravenous Given 07/30/19 1259)     ED Discharge Orders    None       Note:  This document was prepared using Dragon voice recognition software and may include unintentional dictation errors.   Vanessa [place], MD 07/30/19 1312

## 2019-07-30 NOTE — Progress Notes (Signed)
Pharmacy Antibiotic Note  Kyle [name] is a 64 y.o. male admitted on 07/30/2019 with cellulitis.  Pharmacy has been consulted for Vancomycin dosing.  Plan: Pharmacy consulted to dose vancomycin in this 64 year old male admitted with cellulitis, AKI.  Of note, pts baseline SrCr is ~ 0.6 - 0.7, presents today with SrCr of 1.99.     Vancomycin 2500 mg IV X 1 ordered to be given on 2/14 @ 1800. Vancomycin 1750 mg IV Q24H ordered to start on 2/15 @ 1800.  No peak or trough currently ordered.  Will need to follow SrCr and adjust dose accordingly.   Vd = 74.9 L  Ke = 0.045 hr-1 T1/2 = 15.3 hrs AUC = 517.5  Vanc trough = 13.1   Height: 6\' 5"  (195.6 cm) Weight: (!) 330 lb (149.7 kg) IBW/kg (Calculated) : 89.1  Temp (24hrs), Avg:98.1 F (36.7 C), Min:98.1 F (36.7 C), Max:98.1 F (36.7 C)  Recent Labs  Lab 07/30/19 0832 07/30/19 1042  WBC 6.1  --   CREATININE 1.99*  --   LATICACIDVEN 0.6 0.8    Estimated Creatinine Clearance: 62.5 mL/min (A) (by C-G formula based on SCr of 1.99 mg/dL (H)).    Allergies  Allergen Reactions  . Vioxx [Rofecoxib] Rash    Antimicrobials this admission:   >>    >>   Dose adjustments this admission:  Microbiology results:  BCx:   UCx:    Sputum:    MRSA PCR:   Thank you for allowing pharmacy to be a part of this patient's care.  Schyler Counsell D 07/30/2019 5:00 PM

## 2019-07-31 DIAGNOSIS — R1310 Dysphagia, unspecified: Secondary | ICD-10-CM | POA: Diagnosis present

## 2019-07-31 DIAGNOSIS — I872 Venous insufficiency (chronic) (peripheral): Secondary | ICD-10-CM | POA: Diagnosis present

## 2019-07-31 DIAGNOSIS — Z801 Family history of malignant neoplasm of trachea, bronchus and lung: Secondary | ICD-10-CM | POA: Diagnosis not present

## 2019-07-31 DIAGNOSIS — Y833 Surgical operation with formation of external stoma as the cause of abnormal reaction of the patient, or of later complication, without mention of misadventure at the time of the procedure: Secondary | ICD-10-CM | POA: Diagnosis not present

## 2019-07-31 DIAGNOSIS — Z20822 Contact with and (suspected) exposure to covid-19: Secondary | ICD-10-CM | POA: Diagnosis present

## 2019-07-31 DIAGNOSIS — L03114 Cellulitis of left upper limb: Secondary | ICD-10-CM | POA: Diagnosis present

## 2019-07-31 DIAGNOSIS — M79602 Pain in left arm: Secondary | ICD-10-CM | POA: Diagnosis present

## 2019-07-31 DIAGNOSIS — Z87891 Personal history of nicotine dependence: Secondary | ICD-10-CM | POA: Diagnosis not present

## 2019-07-31 DIAGNOSIS — I69391 Dysphagia following cerebral infarction: Secondary | ICD-10-CM | POA: Diagnosis not present

## 2019-07-31 DIAGNOSIS — Z6841 Body Mass Index (BMI) 40.0 and over, adult: Secondary | ICD-10-CM | POA: Diagnosis not present

## 2019-07-31 DIAGNOSIS — K219 Gastro-esophageal reflux disease without esophagitis: Secondary | ICD-10-CM | POA: Diagnosis present

## 2019-07-31 DIAGNOSIS — X58XXXD Exposure to other specified factors, subsequent encounter: Secondary | ICD-10-CM | POA: Diagnosis present

## 2019-07-31 DIAGNOSIS — E662 Morbid (severe) obesity with alveolar hypoventilation: Secondary | ICD-10-CM | POA: Diagnosis present

## 2019-07-31 DIAGNOSIS — I10 Essential (primary) hypertension: Secondary | ICD-10-CM | POA: Diagnosis present

## 2019-07-31 DIAGNOSIS — S7011XD Contusion of right thigh, subsequent encounter: Secondary | ICD-10-CM | POA: Diagnosis not present

## 2019-07-31 DIAGNOSIS — G894 Chronic pain syndrome: Secondary | ICD-10-CM | POA: Diagnosis present

## 2019-07-31 DIAGNOSIS — Z66 Do not resuscitate: Secondary | ICD-10-CM | POA: Diagnosis present

## 2019-07-31 DIAGNOSIS — K9423 Gastrostomy malfunction: Secondary | ICD-10-CM | POA: Diagnosis not present

## 2019-07-31 DIAGNOSIS — M1990 Unspecified osteoarthritis, unspecified site: Secondary | ICD-10-CM | POA: Diagnosis present

## 2019-07-31 DIAGNOSIS — F4323 Adjustment disorder with mixed anxiety and depressed mood: Secondary | ICD-10-CM | POA: Diagnosis present

## 2019-07-31 DIAGNOSIS — Y828 Other medical devices associated with adverse incidents: Secondary | ICD-10-CM | POA: Diagnosis not present

## 2019-07-31 DIAGNOSIS — J438 Other emphysema: Secondary | ICD-10-CM | POA: Diagnosis present

## 2019-07-31 DIAGNOSIS — N179 Acute kidney failure, unspecified: Secondary | ICD-10-CM | POA: Diagnosis present

## 2019-07-31 DIAGNOSIS — D649 Anemia, unspecified: Secondary | ICD-10-CM | POA: Diagnosis present

## 2019-07-31 DIAGNOSIS — Z8249 Family history of ischemic heart disease and other diseases of the circulatory system: Secondary | ICD-10-CM | POA: Diagnosis not present

## 2019-07-31 DIAGNOSIS — I69320 Aphasia following cerebral infarction: Secondary | ICD-10-CM | POA: Diagnosis not present

## 2019-07-31 DIAGNOSIS — L0390 Cellulitis, unspecified: Secondary | ICD-10-CM | POA: Diagnosis present

## 2019-07-31 DIAGNOSIS — Z808 Family history of malignant neoplasm of other organs or systems: Secondary | ICD-10-CM | POA: Diagnosis not present

## 2019-07-31 DIAGNOSIS — I482 Chronic atrial fibrillation, unspecified: Secondary | ICD-10-CM | POA: Diagnosis present

## 2019-07-31 LAB — BASIC METABOLIC PANEL
Anion gap: 8 (ref 5–15)
BUN: 30 mg/dL — ABNORMAL HIGH (ref 8–23)
CO2: 24 mmol/L (ref 22–32)
Calcium: 8.9 mg/dL (ref 8.9–10.3)
Chloride: 104 mmol/L (ref 98–111)
Creatinine, Ser: 1.95 mg/dL — ABNORMAL HIGH (ref 0.61–1.24)
GFR calc Af Amer: 42 mL/min — ABNORMAL LOW (ref >60)
GFR calc non Af Amer: 36 mL/min — ABNORMAL LOW (ref >60)
Glucose, Bld: 102 mg/dL — ABNORMAL HIGH (ref 70–99)
Potassium: 4.1 mmol/L (ref 3.5–5.1)
Sodium: 136 mmol/L (ref 135–145)

## 2019-07-31 LAB — MRSA PCR SCREENING: MRSA by PCR: NEGATIVE

## 2019-07-31 LAB — CBC
HCT: 25.3 % — ABNORMAL LOW (ref 39.0–52.0)
Hemoglobin: 8.1 g/dL — ABNORMAL LOW (ref 13.0–17.0)
MCH: 29.8 pg (ref 26.0–34.0)
MCHC: 32 g/dL (ref 30.0–36.0)
MCV: 93 fL (ref 80.0–100.0)
Platelets: 209 10*3/uL (ref 150–400)
RBC: 2.72 MIL/uL — ABNORMAL LOW (ref 4.22–5.81)
RDW: 15.7 % — ABNORMAL HIGH (ref 11.5–15.5)
WBC: 6.5 10*3/uL (ref 4.0–10.5)
nRBC: 0 % (ref 0.0–0.2)

## 2019-07-31 LAB — CK: Total CK: 165 U/L (ref 49–397)

## 2019-07-31 MED ORDER — CHLORHEXIDINE GLUCONATE CLOTH 2 % EX PADS
6.0000 | MEDICATED_PAD | Freq: Every day | CUTANEOUS | Status: DC
  Administered 2019-07-31 – 2019-08-08 (×8): 6 via TOPICAL

## 2019-07-31 MED ORDER — JEVITY 1.5 CAL/FIBER PO LIQD
1000.0000 mL | ORAL | Status: DC
  Administered 2019-07-31 – 2019-08-08 (×4): 1000 mL via ORAL

## 2019-07-31 MED ORDER — PRO-STAT SUGAR FREE PO LIQD
30.0000 mL | Freq: Two times a day (BID) | ORAL | Status: DC
  Administered 2019-07-31 – 2019-08-08 (×8): 30 mL

## 2019-07-31 MED ORDER — JEVITY 1.2 CAL PO LIQD
1000.0000 mL | ORAL | Status: DC
  Administered 2019-07-31: 05:00:00 1000 mL

## 2019-07-31 MED ORDER — BACLOFEN 10 MG PO TABS
5.0000 mg | ORAL_TABLET | Freq: Two times a day (BID) | ORAL | Status: DC
  Administered 2019-07-31 – 2019-08-08 (×11): 5 mg
  Filled 2019-07-31 (×18): qty 0.5

## 2019-07-31 MED ORDER — SIMETHICONE 40 MG/0.6ML PO SUSP (UNIT DOSE)
160.0000 mg | Freq: Four times a day (QID) | ORAL | Status: DC
  Administered 2019-07-31 – 2019-08-08 (×23): 160 mg
  Filled 2019-07-31 (×3): qty 30
  Filled 2019-07-31: qty 2.4
  Filled 2019-07-31 (×3): qty 30
  Filled 2019-07-31: qty 2.4
  Filled 2019-07-31 (×2): qty 30
  Filled 2019-07-31: qty 2.4
  Filled 2019-07-31 (×2): qty 30
  Filled 2019-07-31: qty 2.4
  Filled 2019-07-31: qty 30
  Filled 2019-07-31: qty 2.4
  Filled 2019-07-31: qty 30
  Filled 2019-07-31 (×2): qty 2.4
  Filled 2019-07-31 (×5): qty 30
  Filled 2019-07-31 (×2): qty 2.4
  Filled 2019-07-31: qty 30
  Filled 2019-07-31: qty 2.4
  Filled 2019-07-31: qty 30
  Filled 2019-07-31 (×3): qty 2.4
  Filled 2019-07-31: qty 30
  Filled 2019-07-31: qty 2.4

## 2019-07-31 MED ORDER — BACLOFEN 10 MG PO TABS: 5.0000 mg | ORAL_TABLET | Freq: Two times a day (BID) | ORAL | Status: DC

## 2019-07-31 NOTE — Progress Notes (Signed)
[facility] Progress Note  Patient: Kyle [name]    EHM:[id number]  DOA: 07/30/2019     Date of Service: the patient was seen and examined on 07/31/2019  Chief Complaint  Patient presents with  . Arm Pain   Brief hospital course: Initially presented on 04/30/2019 for acute CVA.  From there patient was discharged to rehab.  Patient signed out AMA from the rehab.  Had an aspiration event at home and was readmitted on 06/08/2019.  Discharged on 06/23/2019 to SNF. Now presenting with complaints of left arm swelling and pain.  Currently being admitted and treated for cellulitis. Currently further plan is continue IV antibiotics and follow-up on cultures.  Assessment and Plan: 1.  Left arm pain Cellulitis Presents with uncontrolled pain ongoing for almost a week. EDP discussed with orthopedics as well as vascular surgery. CTA upper extremity limited to radial and ulnar arteries on the left only.  Remainder of the arterial structures including left subclavian of the left upper extremity within normal limits.  Edema is seen but no focal hematoma noted. Left upper extremity Doppler is also negative for any DVT. CRP mildly elevated.  ESR 128 though. Patient currently being treated with IV antibiotics will monitor cultures. At present we will continue to monitor.  Measurable left arm circumference. Discussed with hand surgery at [facility] currently recommending conservative measures appreciate their assistance.  2.  Acute kidney injury Baseline renal function normal. Likely prerenal Presents with a serum creatinine of 1.99.  Currently trending down. Continue IV fluids. Monitor bladder retention  3.  Recent right MCA CVA Chronic atrial fibrillation SP revascularization by IR. 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 Remains n.p.o. Continue tube feeding. Speech evaluation. Now the patient is on full dose of Lovenox.  In the setting of the  renal function will monitor closely.  May benefit from transition to [place].  4.  Essential hypertension Continue metoprolol.  Monitor.  5. Recent large right thigh hematoma Monitor  Diet: N.p.o., on tube feedings DVT Prophylaxis: Therapeutic Anticoagulation with Lovenox   Advance goals of care discussion: DNR per prior discussion with palliative care and the nephew.  Family Communication: NO family was present at bedside, at the time of interview.   Disposition:  Pt is from SNF, admitted with cellulitis, still needs IV antibiotics, which precludes a safe discharge. Discharge to SNF, when medically stable.  Subjective: Patient is aphasic but is able to write down his complaint.  Denies any nausea or vomiting but continues to have cough, continues to have trouble breathing.  Continues to report severe pain in his left arm.  Wants ice chips.  Physical Exam: General:  alert oriented to time and person.  Appear in moderate distress, affect anxious Eyes: PERRL ENT: Oral Mucosa Clear, dry  Neck: difficult to assess  JVD,  Cardiovascular: S1 and S2 Present, no Murmur,  Respiratory: good respiratory effort, Bilateral Air entry equal and Decreased, bilateral  Crackles, no wheezes Abdomen: Bowel Sound present, Soft and no tenderness,  Skin: no rash Extremities: Left upper extremity edema, no pedal edema, no calf tenderness Neurologic: without any new focal findings  Gait not checked due to patient safety concerns  Vitals:   07/30/19 1630 07/30/19 1747 07/30/19 2208 07/31/19 0026  BP: (!) 162/112 (!) 146/91 (!) 186/86 (!) 155/87  Pulse: 89 88  78  Resp: _0 Temp:  98.2 F (36.8 C)  98.5 F (36.9 C)  TempSrc:  Oral  Oral  SpO2: 99% (!) 89%  91%  Weight:  (!) 159.5 kg    Height:  _0  (1.956 m)      Intake/Output Summary (Last 24 hours) at 07/31/2019 0814 Last data filed at 07/31/2019 6433 Gross per 24 hour  Intake 2011.99 ml  Output --  Net 2011.99 ml   Filed Weights    07/30/19 0838 07/30/19 1747  Weight: (!) 149.7 kg (!) 159.5 kg    Data Reviewed: I have personally reviewed and interpreted daily labs, tele strips, imagings as discussed above. I reviewed all nursing notes, pharmacy notes, vitals, pertinent old records I have discussed plan of care as described above with RN and patient/family.  CBC: Recent Labs  Lab 07/30/19 0832 07/31/19 0309  WBC 6.1 6.5  NEUTROABS 4.1  --   HGB 7.8* 8.1*  HCT 24.9* 25.3*  MCV 94.0 93.0  PLT 217 295   Basic Metabolic Panel: Recent Labs  Lab 07/30/19 0832 07/31/19 0309  NA 136 136  K 4.5 4.1  CL 103 104  CO2 27 24  GLUCOSE 95 102*  BUN 36* 30*  CREATININE 1.99* 1.95*  CALCIUM 8.8* 8.9    Studies: CT ANGIO UP EXTREM LEFT W &/OR WO CONTAST  Result Date: 07/30/2019 CLINICAL DATA:  Left arm pain EXAM: CT ANGIOGRAPHY OF THE LEFT UPPEREXTREMITY TECHNIQUE: Multidetector CT imaging of the left upper extremitywas performed using the standard protocol during bolus administration of intravenous contrast. Multiplanar CT image reconstructions and MIPs were obtained to evaluate the vascular anatomy. CONTRAST:  172m OMNIPAQUE IOHEXOL 350 MG/ML SOLN COMPARISON:  Venous ultrasound obtained on the same day. FINDINGS: Vascular: Thoracic aorta demonstrates atherosclerotic calcifications without aneurysmal dilatation or dissection. No cardiac enlargement is seen. No abdominal aneurysmal dilatation is noted. The origins of the brachiocephalic vessels are patent with mild atherosclerotic calcifications. The visualized portions of the carotid arteries and right subclavian artery are within normal limits. The left subclavian artery demonstrates atherosclerotic calcification although no focal stenosis is identified. The axillary and brachial arteries are within normal limits. The brachial bifurcation is within normal limits although the timing of the contrast bolus limits evaluation of the radial and ulnar arteries. Comparison  with the recent ultrasound examination shows these to be patent. Correlation with distal pulses is recommended. Nonvascular: The soft tissues of the neck appear within normal limits. Thoracic inlet is unremarkable. No sizable hilar or mediastinal adenopathy is noted. The esophagus is within normal limits. Lungs are well aerated bilaterally without focal infiltrate or sizable effusion. No definitive parenchymal nodules are seen. Left upper extremity demonstrates mild edema although no sizable fluid collection or focal hematoma is identified. Visualized abdominal structures show no acute abnormality. A jejunostomy catheter is not seen multiple rounded soft tissue densities are noted in the subcutaneous fat of the anterior abdominal wall likely related to injection granulomas. Some calcified injection granulomas are noted in the left buttock. Postsurgical changes are noted within the stomach. Degenerative changes of the thoracolumbar spine are seen. No acute bony abnormality is noted. Review of the MIP images confirms the above findings. IMPRESSION: Vascular: Timing of the contrast bolus limits evaluation of the radial and ulnar arteries on the left. Comparison with recent ultrasound shows these to be patent. The remainder of the arterial structures of the left upper extremity appear within normal limits. Correlation with distal pulses is recommended. Nonvascular: Edema is seen within the left arm laterally and anteriorly although no focal hematoma is noted. No acute abnormality is seen.  Electronically Signed   By: Inez Catalina M.D.   On: 07/30/2019 11:00   US Venous Img Upper Uni Left  Result Date: 07/30/2019 CLINICAL DATA:  Left arm pain and swelling EXAM: LEFT UPPER EXTREMITY VENOUS DOPPLER ULTRASOUND TECHNIQUE: Gray-scale sonography with graded compression, as well as color Doppler and duplex ultrasound were performed to evaluate the upper extremity deep venous system from the level of the subclavian vein and  including the jugular, axillary, basilic, radial, ulnar and upper cephalic vein. Spectral Doppler was utilized to evaluate flow at rest and with distal augmentation maneuvers. COMPARISON:  None. FINDINGS: Contralateral Subclavian Vein: Respiratory phasicity is normal and symmetric with the symptomatic side. No evidence of thrombus. Normal compressibility. Internal Jugular Vein: No evidence of thrombus. Normal compressibility, respiratory phasicity and response to augmentation. Subclavian Vein: No evidence of thrombus. Normal compressibility, respiratory phasicity and response to augmentation. Axillary Vein: No evidence of thrombus. Normal compressibility, respiratory phasicity and response to augmentation. Cephalic Vein: No evidence of thrombus. Normal compressibility, respiratory phasicity and response to augmentation. Basilic Vein: No evidence of thrombus. Normal compressibility, respiratory phasicity and response to augmentation. Brachial Veins: No evidence of thrombus. Normal compressibility, respiratory phasicity and response to augmentation. Radial Veins: No evidence of thrombus. Normal compressibility, respiratory phasicity and response to augmentation. Ulnar Veins: No evidence of thrombus. Normal compressibility, respiratory phasicity and response to augmentation. Venous Reflux:  None visualized. Other Findings:  Mild subcutaneous edema is seen. IMPRESSION: No evidence of DVT within the left upper extremity. Electronically Signed   By: Inez Catalina M.D.   On: 07/30/2019 11:01    Scheduled Meds: . ALPRAZolam  0.5 mg Per Tube TID  . enoxaparin (LOVENOX) injection  150 mg Subcutaneous Q12H  . feeding supplement (PRO-STAT SUGAR FREE 64)  30 mL Per Tube TID  . free water  200 mL Per Tube Q6H  . gabapentin  600 mg Per Tube QID  . glycopyrrolate  1 mg Oral BID  . metoprolol tartrate  12.5 mg Per Tube BID  . nystatin  1 mL Oral TID  . pantoprazole sodium  40 mg Per Tube Daily  . sertraline  20 mg Per  Tube Daily  . simethicone  160 mg Per Tube QID   Continuous Infusions: . sodium chloride 75 mL/hr at 07/31/19 0642  . cefTRIAXone (ROCEPHIN)  IV Stopped (07/30/19 2312)  . feeding supplement (JEVITY 1.2 CAL) 1,000 mL (07/31/19 0507)  . vancomycin     PRN Meds: acetaminophen, albuterol, ALPRAZolam, cyclobenzaprine, diphenhydrAMINE, hydrALAZINE, morphine injection, ondansetron (ZOFRAN) IV, oxyCODONE-acetaminophen  Time spent: 35 minutes  Author: Berle Mull, MD [facility] 07/31/2019 8:14 AM  To reach On-call, see care teams to locate the attending and reach out to them via [email or web address]. If 7PM-7AM, please contact night-coverage If you still have difficulty reaching the attending provider, please page the [facility] (Director on Call) for [facility] on amion for assistance.

## 2019-07-31 NOTE — Progress Notes (Signed)
Initial Nutrition Assessment  DOCUMENTATION CODES:   Morbid obesity  INTERVENTION:  Initiate new goal TF regimen of Jevity 1.5 Cal at 70 ml/hr + Pro-Stat 30 mL BID per tube. Provides 2720 kcal, 137 grams of protein, 1277 mL H2O daily.  With free water flush of 200 mL Q6hrs patient will receive a total of 2077 mL H2O including water in tube feeding.  Goal TF regimen meets 100% RDIs for vitamins/minerals.  NUTRITION DIAGNOSIS:   Inadequate oral intake related to inability to eat as evidenced by NPO status.  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   Met with patient at bedside. He is unable to provide any history in setting of aphasia. No paper chart sent over from facility so unable to see what TF regimen patient has been on. He is currently receiving Jevity 1.2 Cal at 60 mL/hr + Pro-Stat 30 mL TID which provides 2028 kcal, 125 grams of protein, 1166 mL H2O daily. During previous admission patient was on Jevity 1.5 Cal at 70 mL/hr + Pro-Stat 30 mL daily which provides 2620 kcal, 122 grams of protein, 1277 mL H2O daily. Patient was assessed by SLP and plan is for him to remain NPO with J-tube feeds.  Weights in chart fluctuate significantly so difficult to trend. He is currently 159.5 kg (351.63 lbs).  Medications reviewed and include: Xanax, free water flush 200 mL Q6hrs, gabapentin, glycopyrrolate, Protonix, sertraline, simethicone, NS at 75 mL/hr, ceftriaxone, vancomycin.  Labs reviewed: BUN 30, Creatinine 1.95.  Enteral Access:  16 Fr. J-tube placed on 12/21 as patient's stomach anatomy was not appropriate for G-tube placement  NUTRITION - FOCUSED PHYSICAL EXAM:    Most Recent Value  Orbital Region  No depletion  Upper Arm Region  Mild depletion  Thoracic and Lumbar Region  No depletion  Buccal Region  No depletion  Temple Region  Mild depletion  Clavicle Bone Region  Mild depletion  Clavicle and Acromion Bone Region  Mild depletion  Scapular Bone Region  No depletion  Dorsal Hand  Mild depletion  Patellar Region  Unable to assess  Anterior Thigh Region  Unable to assess  Posterior Calf Region  Unable to assess  Edema (RD Assessment)  Moderate  Hair  Reviewed  Eyes  Unable to assess  Mouth  Unable to assess  Skin  Reviewed  Nails  Reviewed      Diet Order:   Diet Order            Diet NPO time specified  Diet effective now             EDUCATION NEEDS:   No education needs have been identified at this time  Skin:  Skin Assessment: Reviewed RN Assessment  Last BM:  07/30/2019 small type 4  Height:   Ht Readings from Last 1 Encounters:  07/30/19 6' 5"  (1.956 m)   Weight:   Wt Readings from Last 1 Encounters:  07/30/19 (!) 159.5 kg   Ideal Body Weight:  94.5 kg  BMI:  Body mass index is  41.7 kg/m.  Estimated Nutritional Needs:   Kcal:  2500-2700  Protein:  125-135 grams  Fluid:  >/= 2 L/day  Kyle Barnacle, MS, RD, LDN Pager number available on Amion

## 2019-07-31 NOTE — Progress Notes (Signed)
SLP Cancellation Note  Patient Details Name: Kyle [name] MRN: [id number] DOB: [birth year]   Cancelled treatment:       Reason Eval/Treat Not Completed: Patient not medically ready;Medical issues which prohibited therapy(chart reviewed; consulted MD/NSG re: pt's baseline status)  Pt has a BASELINE dx of Severe-Profound oropharyngeal phase Dysphagia per recent admission at [facility] in 06/2019. Per chart notes then, pt was seen at [facility] during the months of 04/2019, 05/2019, and 06/2019 and recommended NPO status and Full PEG support for nutrition/hydration needs for reasons of -- "Severe to Profound oral deficits persist s/p prior CVA including decreased management of saliva (improved with oral care), inability for labial closure, poor lingual ROM, poor bolus control, frequent anterior loss of bolus. Attempted strategic placement of bolus right posterior side of oral cavity with head back posture to improve propulsion to hypopharynx, However, palpable swallow only exhibited in 1/5 trials with ice chips. Teaspoon of nectar thick liquids and puree texture resulted in anterior loss and oral cavity stasis. Pt with Delayed, Weaker Cough."; Poor Airway Protection. It was recommended he could have Pleasure ice chips post oral care; FULL nutrition/hydration per PEG. There was NO discussion about Pleasure foods/drinks. Per [facility] chart notes, pt went to Rehab/SNF, and it seemed that "patient signed out himself [place] and went home and try to eat when he aspirated" at one point.  Recommend NPO status w/ full PEG support unless he and family and Palliative Care/Hospice determine a different GOC/POC. Recommend Frequent oral care and few ice chips w/ NSG for Pleasure IF No increased overt s/s of aspiration noted w/ the ice chips. Recommend f/u at SNF or [facility] for skilled ST services for dysphagia tx BEFORE initiation of an oral diet. Suspect pt is at high risk for Pulmonary decline  w/ his degree of dysphagia as described per [facility] notes. Further discussion w/ MD re: pt's GOC/POC. Of note, as of this morning, NSG went to pt's room where he found pt w/ a mouthful of Oatmeal pocketed/held which had to be scooped out d/t concern for choking on it; coughing noted. An oral diet had been ordered per MD yesterday for "Pleasure".  Discussed further w/ MD; ST services will f/u w/ further education as needed while admitted. NSG updated.       Orinda Kenner, MS, CCC-SLP Laynie Espy 07/31/2019, 10:43 AM

## 2019-08-01 LAB — CBC
HCT: 22.9 % — ABNORMAL LOW (ref 39.0–52.0)
Hemoglobin: 7.5 g/dL — ABNORMAL LOW (ref 13.0–17.0)
MCH: 30.2 pg (ref 26.0–34.0)
MCHC: 32.8 g/dL (ref 30.0–36.0)
MCV: 92.3 fL (ref 80.0–100.0)
Platelets: 201 10*3/uL (ref 150–400)
RBC: 2.48 MIL/uL — ABNORMAL LOW (ref 4.22–5.81)
RDW: 15.6 % — ABNORMAL HIGH (ref 11.5–15.5)
WBC: 8.2 10*3/uL (ref 4.0–10.5)
nRBC: 0 % (ref 0.0–0.2)

## 2019-08-01 LAB — COMPREHENSIVE METABOLIC PANEL
ALT: 16 U/L (ref 0–44)
AST: 19 U/L (ref 15–41)
Albumin: 2.7 g/dL — ABNORMAL LOW (ref 3.5–5.0)
Alkaline Phosphatase: 50 U/L (ref 38–126)
Anion gap: 5 (ref 5–15)
BUN: 29 mg/dL — ABNORMAL HIGH (ref 8–23)
CO2: 28 mmol/L (ref 22–32)
Calcium: 8.7 mg/dL — ABNORMAL LOW (ref 8.9–10.3)
Chloride: 106 mmol/L (ref 98–111)
Creatinine, Ser: 1.67 mg/dL — ABNORMAL HIGH (ref 0.61–1.24)
GFR calc Af Amer: 50 mL/min — ABNORMAL LOW (ref >60)
GFR calc non Af Amer: 44 mL/min — ABNORMAL LOW (ref >60)
Glucose, Bld: 118 mg/dL — ABNORMAL HIGH (ref 70–99)
Potassium: 3.7 mmol/L (ref 3.5–5.1)
Sodium: 139 mmol/L (ref 135–145)
Total Bilirubin: 0.6 mg/dL (ref 0.3–1.2)
Total Protein: 6.6 g/dL (ref 6.5–8.1)

## 2019-08-01 NOTE — TOC Progression Note (Signed)
Transition of Care [facility]) - Progression Note    Patient Details  Name: Kyle [name] MRN: [id number] Date of Birth: [birth year]  Transition of Care [facility]) CM/SW [place], RN Phone Number: 08/01/2019, 1:30 PM  Clinical Narrative:   Confirmed with [facility] that the patient resides in the LTC unit at [facility], he has a Peg Tube that is used for all nutrition, Will continue to monitor for DC, Plan to DC back to [facility] when medically ready       Expected Discharge Plan and Services                                                 Social Determinants of Health (SDOH) Interventions    Readmission Risk Interventions No flowsheet data found.

## 2019-08-01 NOTE — Progress Notes (Addendum)
[facility] Progress Note  Patient: Kyle [name]    ZOX:[id number]  DOA: 07/30/2019     Date of Service: the patient was seen and examined on 08/01/2019  Chief Complaint  Patient presents with  . Arm Pain   Brief hospital course: Initially presented on 04/30/2019 for acute CVA.  From there patient was discharged to rehab.  Patient signed out AMA from the rehab.  Had an aspiration event at home and was readmitted on 06/08/2019.  Discharged on 06/23/2019 to SNF. Now presenting with complaints of left arm swelling and pain.  Currently being admitted and treated for cellulitis. Currently further plan is continue IV antibiotics and follow-up on cultures.  Assessment and Plan: 1.  Left arm pain Cellulitis Presents with uncontrolled pain ongoing for almost a week. EDP discussed with orthopedics as well as vascular surgery. CTA upper extremity limited to radial and ulnar arteries on the left only.  Remainder of the arterial structures including left subclavian of the left upper extremity within normal limits.  Edema is seen but no focal hematoma noted. Left upper extremity Doppler is also negative for any DVT. CRP mildly elevated.  ESR 128 though. Patient currently being treated with IV antibiotics will monitor cultures. At present we will continue to monitor.  Measurable left arm circumference. Discussed with hand surgery at [facility] currently recommending conservative measures appreciate their assistance. --continue ceftriaxone for now --Add vanc since left arm swelling and pain had not improved  2.  Acute kidney injury Baseline renal function normal. Likely prerenal Presents with a serum creatinine of 1.99.  Currently trending down. D/c IV fluids today Monitor bladder retention  3.  Recent right MCA CVA Chronic atrial fibrillation SP revascularization by IR. 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 Continue tube feeding. Speech  evaluation. Now the patient is on full dose of Lovenox.  In the setting of the renal function will monitor closely.  May benefit from transition to [place]. --NPO but can have ice chips.  4.  Essential hypertension Continue metoprolol.  Monitor.  5. Recent large right thigh hematoma Monitor  Diet: N.p.o., on tube feedings but can have ice chips DVT Prophylaxis: Therapeutic Anticoagulation with Lovenox   Advance goals of care discussion: DNR per prior discussion with palliative care and the nephew.  Family Communication: NO family was present at bedside, at the time of interview.   Disposition:  Pt is from SNF, admitted with cellulitis, still needs IV antibiotics, cellulitis not improving, which precludes a safe discharge. Discharge to SNF, when medically stable.  Subjective:  Pt indicated pain in his left arm.  Also dry mouth.  No fever, N/V/D.  Physical Exam: Constitutional: NAD, AAOx3, complete expressive aphasia, but comprehension intact HEENT: conjunctivae and lids normal, EOMI CV: RRR no M,R,G. Distal pulses +2.  No cyanosis.   RESP: Diffuse rhonchi, normal respiratory effort  GI: +BS, NTND Extremities: Swelling in entire left arm SKIN: warm, dry and intact    Vitals:   07/31/19 1547 08/01/19 0034 08/01/19 0725 08/01/19 1712  BP: (!) 152/87 (!) 173/82 (!) 178/70 (!) 149/69  Pulse: 81 79 69 78  Resp: 18 18    Temp: 99.3 F (37.4 C) 98.7 F (37.1 C) 99 F (37.2 C) 97.8 F (36.6 C)  TempSrc: Oral Oral Oral Oral  SpO2: 100% 100% 99% 100%  Weight:      Height:        Intake/Output Summary (Last 24 hours) at 08/01/2019 1810  Last data filed at 08/01/2019 1110 Gross per 24 hour  Intake 1746.67 ml  Output 1250 ml  Net 496.67 ml   Filed Weights   07/30/19 0838 07/30/19 1747  Weight: (!) 149.7 kg (!) 159.5 kg    Data Reviewed: I have personally reviewed and interpreted daily labs, tele strips, imagings as discussed above. I reviewed all nursing notes, pharmacy  notes, vitals, pertinent old records I have discussed plan of care as described above with RN and patient/family.  CBC: Recent Labs  Lab 07/30/19 0832 07/31/19 0309 08/01/19 0500  WBC 6.1 6.5 8.2  NEUTROABS 4.1  --   --   HGB 7.8* 8.1* 7.5*  HCT 24.9* 25.3* 22.9*  MCV 94.0 93.0 92.3  PLT 217 209 854   Basic Metabolic Panel: Recent Labs  Lab 07/30/19 0832 07/31/19 0309 08/01/19 0500  NA 136 136 139  K 4.5 4.1 3.7  CL 103 104 106  CO2 27 24 28   GLUCOSE 95 102* 118*  BUN 36* 30* 29*  CREATININE 1.99* 1.95* 1.67*  CALCIUM 8.8* 8.9 8.7*    Studies: No results found.  Scheduled Meds: . ALPRAZolam  0.5 mg Per Tube TID  . baclofen  5 mg Per Tube BID  . Chlorhexidine Gluconate Cloth  6 each Topical Daily  . enoxaparin (LOVENOX) injection  150 mg Subcutaneous Q12H  . feeding supplement (PRO-STAT SUGAR FREE 64)  30 mL Per Tube BID  . free water  200 mL Per Tube Q6H  . gabapentin  600 mg Per Tube QID  . glycopyrrolate  1 mg Oral BID  . metoprolol tartrate  12.5 mg Per Tube BID  . nystatin  1 mL Oral TID  . pantoprazole sodium  40 mg Per Tube Daily  . sertraline  20 mg Per Tube Daily  . simethicone  160 mg Per Tube QID   Continuous Infusions: . cefTRIAXone (ROCEPHIN)  IV 2 g (07/31/19 1657)  . feeding supplement (JEVITY 1.5 CAL/FIBER) 1,000 mL (08/01/19 0921)   PRN Meds: acetaminophen, albuterol, ALPRAZolam, cyclobenzaprine, hydrALAZINE, ondansetron (ZOFRAN) IV, oxyCODONE-acetaminophen   Enzo Bi, MD  08/01/2019 6:10 PM  To reach On-call, see care teams to locate the attending and reach out to them via [email or web address]. If 7PM-7AM, please contact night-coverage If you still have difficulty reaching the attending provider, please page the [facility] (Director on Call) for [facility] on amion for assistance.

## 2019-08-02 LAB — BASIC METABOLIC PANEL
Anion gap: 7 (ref 5–15)
BUN: 29 mg/dL — ABNORMAL HIGH (ref 8–23)
CO2: 28 mmol/L (ref 22–32)
Calcium: 8.6 mg/dL — ABNORMAL LOW (ref 8.9–10.3)
Chloride: 105 mmol/L (ref 98–111)
Creatinine, Ser: 1.66 mg/dL — ABNORMAL HIGH (ref 0.61–1.24)
GFR calc Af Amer: 51 mL/min — ABNORMAL LOW (ref >60)
GFR calc non Af Amer: 44 mL/min — ABNORMAL LOW (ref >60)
Glucose, Bld: 106 mg/dL — ABNORMAL HIGH (ref 70–99)
Potassium: 3.7 mmol/L (ref 3.5–5.1)
Sodium: 140 mmol/L (ref 135–145)

## 2019-08-02 LAB — CBC
HCT: 23.3 % — ABNORMAL LOW (ref 39.0–52.0)
Hemoglobin: 7.3 g/dL — ABNORMAL LOW (ref 13.0–17.0)
MCH: 29.2 pg (ref 26.0–34.0)
MCHC: 31.3 g/dL (ref 30.0–36.0)
MCV: 93.2 fL (ref 80.0–100.0)
Platelets: 222 10*3/uL (ref 150–400)
RBC: 2.5 MIL/uL — ABNORMAL LOW (ref 4.22–5.81)
RDW: 15.4 % (ref 11.5–15.5)
WBC: 7.1 10*3/uL (ref 4.0–10.5)
nRBC: 0 % (ref 0.0–0.2)

## 2019-08-02 LAB — MAGNESIUM: Magnesium: 1.8 mg/dL (ref 1.7–2.4)

## 2019-08-02 MED ORDER — ORAL CARE MOUTH RINSE
15.0000 mL | Freq: Two times a day (BID) | OROMUCOSAL | Status: DC
  Administered 2019-08-02 – 2019-08-08 (×10): 15 mL via OROMUCOSAL

## 2019-08-02 MED ORDER — OXYCODONE HCL 5 MG/5ML PO SOLN: 7.5000 mg | ORAL | Status: DC | PRN

## 2019-08-02 MED ORDER — OXYCODONE HCL 5 MG/5ML PO SOLN
7.5000 mg | ORAL | Status: DC | PRN
  Administered 2019-08-02 – 2019-08-04 (×9): 7.5 mg
  Filled 2019-08-02 (×9): qty 10

## 2019-08-02 MED ORDER — VANCOMYCIN HCL 2000 MG/400ML IV SOLN
2000.0000 mg | INTRAVENOUS | Status: DC
  Administered 2019-08-03 – 2019-08-05 (×3): 2000 mg via INTRAVENOUS
  Filled 2019-08-02 (×4): qty 400

## 2019-08-02 MED ORDER — CHLORHEXIDINE GLUCONATE 0.12 % MT SOLN
15.0000 mL | Freq: Two times a day (BID) | OROMUCOSAL | Status: DC
  Administered 2019-08-02 – 2019-08-07 (×10): 15 mL via OROMUCOSAL
  Filled 2019-08-02 (×10): qty 15

## 2019-08-02 MED ORDER — VANCOMYCIN HCL 10 G IV SOLR
2500.0000 mg | Freq: Once | INTRAVENOUS | Status: AC
  Administered 2019-08-02: 04:00:00 2500 mg via INTRAVENOUS
  Filled 2019-08-02: qty 2500

## 2019-08-02 NOTE — Progress Notes (Signed)
Pharmacy Antibiotic Note  Kyle [name] is a 64 y.o. male admitted on 07/30/2019 with cellulitis.  Pharmacy has been consulted for Vancomycin dosing.  Plan: Pharmacy consulted to dose vancomycin in this 64 year old male admitted with cellulitis, AKI.  Of note, pts baseline SrCr is ~ 0.6 - 0.7, SCr on 2/16 = 1.67  Vancomycin 2500 mg IV X 1 now then  Vancomycin 2000 mg IV Q24H  Will need to follow SrCr and adjust dose accordingly.   Vancomycin 2000 mg IV Q 24 hrs. Goal AUC 400-550. Expected AUC: 473.3 SCr used: 1.67  Height: 6\' 5"  (195.6 cm) Weight: (!) 351 lb 10.1 oz (159.5 kg) IBW/kg (Calculated) : 89.1  Temp (24hrs), Avg:98.2 F (36.8 C), Min:97.8 F (36.6 C), Max:99 F (37.2 C)  Recent Labs  Lab 07/30/19 0832 07/30/19 1042 07/31/19 0309 08/01/19 0500  WBC 6.1  --  6.5 8.2  CREATININE 1.99*  --  1.95* 1.67*  LATICACIDVEN 0.6 0.8  --   --     Estimated Creatinine Clearance: 77.1 mL/min (A) (by C-G formula based on SCr of 1.67 mg/dL (H)).    Allergies  Allergen Reactions  . Vioxx [Rofecoxib] Rash   Antimicrobials this admission: Vancomycin 2/15 x 1, then 2/17 >>  Rocephin 2/14  >>   Dose adjustments this admission:  Microbiology results:  BCx:   UCx:    Sputum:    MRSA PCR:   Thank you for allowing pharmacy to be a part of this patient's care.  Hart Robinsons A 08/02/2019 2:18 AM

## 2019-08-02 NOTE — Progress Notes (Signed)
Ewing Schlein, nurse at [facility], about patient's PICC line. I was informed that the PICC line is being used at [facility] for IV fluids and lab draws.

## 2019-08-02 NOTE — Progress Notes (Signed)
[facility] Progress Note  Patient: Kyle [name]    DJT:[id number]  DOA: 07/30/2019     Date of Service: the patient was seen and examined on 08/02/2019  Chief Complaint  Patient presents with  . Arm Pain   Brief hospital course: Initially presented on 04/30/2019 for acute CVA.  From there patient was discharged to rehab.  Patient signed out AMA from the rehab.  Had an aspiration event at home and was readmitted on 06/08/2019.  Discharged on 06/23/2019 to SNF. Now presenting with complaints of left arm swelling and pain.  Currently being admitted and treated for cellulitis. Currently further plan is continue IV antibiotics and follow-up on cultures.  Assessment and Plan: 1.  Left arm pain Cellulitis Presents with uncontrolled pain ongoing for almost a week. EDP discussed with orthopedics as well as vascular surgery. CTA upper extremity limited to radial and ulnar arteries on the left only.  Remainder of the arterial structures including left subclavian of the left upper extremity within normal limits.  Edema is seen but no focal hematoma noted. Left upper extremity Doppler is also negative for any DVT. CRP mildly elevated.  ESR 128 though. Patient currently being treated with IV antibiotics will monitor cultures. At present we will continue to monitor.  Measurable left arm circumference. Discussed with hand surgery at [facility] currently recommending conservative measures appreciate their assistance. --continue ceftriaxone and vanc  2.  Acute kidney injury Baseline renal function normal. Likely prerenal Presents with a serum creatinine of 1.99.  Currently trending down. D/c IV fluids 2/16 Monitor bladder retention  3.  Recent right MCA CVA Chronic atrial fibrillation SP revascularization by IR. 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 Continue tube feeding. Speech evaluation. Now the patient is on full dose of Lovenox.  In the  setting of the renal function will monitor closely.  May benefit from transition to [place]. --NPO but can have ice chips.  4.  Essential hypertension Continue metoprolol.  Monitor.  5. Recent large right thigh hematoma Monitor  # Chronic pain and chronic anxiety --Pt is on a lot of opioids and benzos PTA.   --Pt needs to wake up to request pain meds.   Diet: N.p.o., on tube feedings but can have ice chips DVT Prophylaxis: Therapeutic Anticoagulation with Lovenox   Advance goals of care discussion: DNR per prior discussion with palliative care and the nephew.  Family Communication: not today  Disposition:  Pt is from SNF, admitted with cellulitis, still needs IV antibiotics, cellulitis not improving, which precludes a safe discharge. Discharge to SNF, when cellulitis improves and can transition to oral abx.  Subjective:  Pt indicated that his left arm pain was unchanged.  After much gesturing and difficult-to-decipher writing, it appeared that pt was asking for his pain meds to be increased to home higher dose and his nerve medications.  No noted fever, N/V/D.   Physical Exam: Constitutional: NAD, AAOx3, complete expressive aphasia, but comprehends.  Found sleeping but easily awoken.   HEENT: conjunctivae and lids normal, EOMI, mucosa dry CV: RRR no M,R,G. Distal pulses +2.  No cyanosis.   RESP: Diffuse rhonchi, gurgled breath sounds, normal respiratory effort  GI: +BS, NTND Extremities: Swelling and warmth in entire left arm SKIN: warm, dry and intact    Vitals:   08/01/19 1712 08/01/19 2320 08/02/19 0856 08/02/19 1610  BP: (!) 149/69 135/75 (!) 145/96 (!) 162/83  Pulse: 78 81 83 76  Resp:  18  20  Temp: 97.8 F (36.6 C) 97.8 F (36.6 C) 98.4 F (36.9 C) 98.5 F (36.9 C)  TempSrc: Oral Oral  Oral  SpO2: 100% 98% 99% 95%  Weight:      Height:        Intake/Output Summary (Last 24 hours) at 08/02/2019 2005 Last data filed at 08/02/2019 0500 Gross per 24 hour    Intake --  Output 1400 ml  Net -1400 ml   Filed Weights   07/30/19 0838 07/30/19 1747  Weight: (!) 149.7 kg (!) 159.5 kg    Data Reviewed: I have personally reviewed and interpreted daily labs, tele strips, imagings as discussed above. I reviewed all nursing notes, pharmacy notes, vitals, pertinent old records I have discussed plan of care as described above with RN and patient/family.  CBC: Recent Labs  Lab 07/30/19 0832 07/31/19 0309 08/01/19 0500 08/02/19 0608  WBC 6.1 6.5 8.2 7.1  NEUTROABS 4.1  --   --   --   HGB 7.8* 8.1* 7.5* 7.3*  HCT 24.9* 25.3* 22.9* 23.3*  MCV 94.0 93.0 92.3 93.2  PLT 217 209 201 409   Basic Metabolic Panel: Recent Labs  Lab 07/30/19 0832 07/31/19 0309 08/01/19 0500 08/02/19 0608  NA 136 136 139 140  K 4.5 4.1 3.7 3.7  CL 103 104 106 105  CO2 _0 GLUCOSE 95 102* 118* 106*  BUN 36* 30* 29* 29*  CREATININE 1.99* 1.95* 1.67* 1.66*  CALCIUM 8.8* 8.9 8.7* 8.6*  MG  --   --   --  1.8    Studies: No results found.  Scheduled Meds: . ALPRAZolam  0.5 mg Per Tube TID  . baclofen  5 mg Per Tube BID  . chlorhexidine  15 mL Mouth Rinse BID  . Chlorhexidine Gluconate Cloth  6 each Topical Daily  . enoxaparin (LOVENOX) injection  150 mg Subcutaneous Q12H  . feeding supplement (PRO-STAT SUGAR FREE 64)  30 mL Per Tube BID  . free water  200 mL Per Tube Q6H  . gabapentin  600 mg Per Tube QID  . glycopyrrolate  1 mg Oral BID  . mouth rinse  15 mL Mouth Rinse q12n4p  . metoprolol tartrate  12.5 mg Per Tube BID  . nystatin  1 mL Oral TID  . pantoprazole sodium  40 mg Per Tube Daily  . sertraline  20 mg Per Tube Daily  . simethicone  160 mg Per Tube QID   Continuous Infusions: . cefTRIAXone (ROCEPHIN)  IV 2 g (08/02/19 1710)  . feeding supplement (JEVITY 1.5 CAL/FIBER) 1,000 mL (08/01/19 0921)  . [START ON 08/03/2019] vancomycin     PRN Meds: acetaminophen, albuterol, ALPRAZolam, cyclobenzaprine, hydrALAZINE, ondansetron (ZOFRAN)  IV, oxyCODONE   Enzo Bi, MD  08/02/2019 8:05 PM  To reach On-call, see care teams to locate the attending and reach out to them via [email or web address]. If 7PM-7AM, please contact night-coverage If you still have difficulty reaching the attending provider, please page the [facility] (Director on Call) for [facility] on amion for assistance.

## 2019-08-03 LAB — CBC
HCT: 24.2 % — ABNORMAL LOW (ref 39.0–52.0)
Hemoglobin: 7.7 g/dL — ABNORMAL LOW (ref 13.0–17.0)
MCH: 29.6 pg (ref 26.0–34.0)
MCHC: 31.8 g/dL (ref 30.0–36.0)
MCV: 93.1 fL (ref 80.0–100.0)
Platelets: 250 10*3/uL (ref 150–400)
RBC: 2.6 MIL/uL — ABNORMAL LOW (ref 4.22–5.81)
RDW: 15.3 % (ref 11.5–15.5)
WBC: 8.4 10*3/uL (ref 4.0–10.5)
nRBC: 0 % (ref 0.0–0.2)

## 2019-08-03 LAB — BASIC METABOLIC PANEL
Anion gap: 6 (ref 5–15)
BUN: 29 mg/dL — ABNORMAL HIGH (ref 8–23)
CO2: 28 mmol/L (ref 22–32)
Calcium: 8.6 mg/dL — ABNORMAL LOW (ref 8.9–10.3)
Chloride: 104 mmol/L (ref 98–111)
Creatinine, Ser: 1.67 mg/dL — ABNORMAL HIGH (ref 0.61–1.24)
GFR calc Af Amer: 50 mL/min — ABNORMAL LOW (ref >60)
GFR calc non Af Amer: 44 mL/min — ABNORMAL LOW (ref >60)
Glucose, Bld: 120 mg/dL — ABNORMAL HIGH (ref 70–99)
Potassium: 3.7 mmol/L (ref 3.5–5.1)
Sodium: 138 mmol/L (ref 135–145)

## 2019-08-03 LAB — MAGNESIUM: Magnesium: 2.1 mg/dL (ref 1.7–2.4)

## 2019-08-03 NOTE — Care Management Important Message (Signed)
Important Message  Patient Details  Name: Kyle [name] MRN: [id number] Date of Birth: November 25, 1955   Medicare Important Message Given:  Yes     Dannette Barbara 08/03/2019, 1:43 PM

## 2019-08-03 NOTE — Progress Notes (Signed)
[facility] Progress Note  Patient: Kyle [name]    IRW:[id number]  DOA: 07/30/2019     Date of Service: the patient was seen and examined on 08/03/2019  Chief Complaint  Patient presents with  . Arm Pain   Brief hospital course: Initially presented on 04/30/2019 for acute CVA.  From there patient was discharged to rehab.  Patient signed out AMA from the rehab.  Had an aspiration event at home and was readmitted on 06/08/2019.  Discharged on 06/23/2019 to SNF. Now presenting with complaints of left arm swelling and pain.  Currently being admitted and treated for cellulitis. Currently further plan is continue IV antibiotics and follow-up on cultures.  Assessment and Plan: 1.  Left arm pain  Cellulitis, improved Presents with uncontrolled pain ongoing for almost a week. EDP discussed with orthopedics as well as vascular surgery. CTA upper extremity limited to radial and ulnar arteries on the left only.  Remainder of the arterial structures including left subclavian of the left upper extremity within normal limits.  Edema is seen but no focal hematoma noted. Left upper extremity Doppler is also negative for any DVT. CRP mildly elevated.  ESR 128 though. Patient currently being treated with IV antibiotics will monitor cultures. At present we will continue to monitor.  Measurable left arm circumference. Discussed with hand surgery at [facility] currently recommending conservative measures appreciate their assistance. --continue ceftriaxone and vanc  2.  Acute kidney injury Baseline renal function normal. Likely prerenal Presents with a serum creatinine of 1.99.  Currently trending down. D/c IV fluids 2/16 Monitor bladder retention  3.  Recent right MCA CVA Chronic atrial fibrillation SP revascularization by IR. 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 Continue tube feeding. Speech evaluation. Now the patient is on full dose of Lovenox.   In the setting of the renal function will monitor closely.  May benefit from transition to [place]. --NPO but can have ice chips.  4.  Essential hypertension Continue metoprolol.  Monitor.  5. Recent large right thigh hematoma Monitor  # Chronic pain and chronic anxiety --Pt is on a lot of opioids and benzos PTA.   --Pt needs to wake up to request pain meds.   Diet: N.p.o., on tube feedings but can have ice chips DVT Prophylaxis: Therapeutic Anticoagulation with Lovenox   Advance goals of care discussion: DNR per prior discussion with palliative care and the nephew.  Family Communication: not today  Disposition:  Pt is from SNF, admitted with cellulitis, still needs IV antibiotics, cellulitis starting to improve, so will consider switching to oral and discharge in 1-2 days, or continue IV abx via PICC line.   Subjective:  Pt again indicated the pain was about the same in his left arm.  Was focused today on showing me the knots left over from Lovenox shots and what he wanted done about it.  No noted fever, N/V/D.   Physical Exam: Constitutional: NAD, AAOx3, complete expressive aphasia, but comprehends.  Found sleeping but easily awoken.   HEENT: conjunctivae and lids normal, EOMI, mucosa dry CV: RRR no M,R,G. Distal pulses +2.  No cyanosis.   RESP: Diffuse rhonchi, gurgled breath sounds, normal respiratory effort  GI: +BS, NTND Extremities: Swelling and warmth in entire left arm improved SKIN: warm, dry and intact    Vitals:   08/02/19 1610 08/03/19 0041 08/03/19 0726 08/03/19 1605  BP: (!) 162/83 (!) 158/88 (!) 147/89 140/84  Pulse: 76 76 71 72  Resp: 20 18 17 17   Temp: 98.5 F (36.9 C) 98.3 F (36.8 C) 98.2 F (36.8 C) 98.4 F (36.9 C)  TempSrc: Oral Oral Oral Oral  SpO2: 95% 98% 100% 100%  Weight:      Height:        Intake/Output Summary (Last 24 hours) at 08/03/2019 1627 Last data filed at 08/03/2019 0604 Gross per 24 hour  Intake 2981.28 ml  Output 2900  ml  Net 81.28 ml   Filed Weights   07/30/19 0838 07/30/19 1747  Weight: (!) 149.7 kg (!) 159.5 kg    Data Reviewed: I have personally reviewed and interpreted daily labs, tele strips, imagings as discussed above. I reviewed all nursing notes, pharmacy notes, vitals, pertinent old records I have discussed plan of care as described above with RN and patient/family.  CBC: Recent Labs  Lab 07/30/19 0832 07/31/19 0309 08/01/19 0500 08/02/19 0608 08/03/19 0444  WBC 6.1 6.5 8.2 7.1 8.4  NEUTROABS 4.1  --   --   --   --   HGB 7.8* 8.1* 7.5* 7.3* 7.7*  HCT 24.9* 25.3* 22.9* 23.3* 24.2*  MCV 94.0 93.0 92.3 93.2 93.1  PLT 217 209 201 222 590   Basic Metabolic Panel: Recent Labs  Lab 07/30/19 0832 07/31/19 0309 08/01/19 0500 08/02/19 0608 08/03/19 0444  NA 136 136 139 140 138  K 4.5 4.1 3.7 3.7 3.7  CL 103 104 106 105 104  CO2 27 24 28 28 28   GLUCOSE 95 102* 118* 106* 120*  BUN 36* 30* 29* 29* 29*  CREATININE 1.99* 1.95* 1.67* 1.66* 1.67*  CALCIUM 8.8* 8.9 8.7* 8.6* 8.6*  MG  --   --   --  1.8 2.1    Studies: No results found.  Scheduled Meds: . ALPRAZolam  0.5 mg Per Tube TID  . baclofen  5 mg Per Tube BID  . chlorhexidine  15 mL Mouth Rinse BID  . Chlorhexidine Gluconate Cloth  6 each Topical Daily  . enoxaparin (LOVENOX) injection  150 mg Subcutaneous Q12H  . feeding supplement (PRO-STAT SUGAR FREE 64)  30 mL Per Tube BID  . free water  200 mL Per Tube Q6H  . gabapentin  600 mg Per Tube QID  . glycopyrrolate  1 mg Oral BID  . mouth rinse  15 mL Mouth Rinse q12n4p  . metoprolol tartrate  12.5 mg Per Tube BID  . nystatin  1 mL Oral TID  . pantoprazole sodium  40 mg Per Tube Daily  . sertraline  20 mg Per Tube Daily  . simethicone  160 mg Per Tube QID   Continuous Infusions: . cefTRIAXone (ROCEPHIN)  IV 2 g (08/02/19 1710)  . feeding supplement (JEVITY 1.5 CAL/FIBER) 1,000 mL (08/01/19 0921)  . vancomycin 2,000 mg (08/03/19 0604)   PRN Meds: acetaminophen,  albuterol, ALPRAZolam, cyclobenzaprine, hydrALAZINE, ondansetron (ZOFRAN) IV, oxyCODONE   Enzo Bi, MD  08/03/2019 4:27 PM  To reach On-call, see care teams to locate the attending and reach out to them via [email or web address]. If 7PM-7AM, please contact night-coverage If you still have difficulty reaching the attending provider, please page the [facility] (Director on Call) for [facility] on amion for assistance.

## 2019-08-04 LAB — CBC
HCT: 24.9 % — ABNORMAL LOW (ref 39.0–52.0)
Hemoglobin: 7.8 g/dL — ABNORMAL LOW (ref 13.0–17.0)
MCH: 29.3 pg (ref 26.0–34.0)
MCHC: 31.3 g/dL (ref 30.0–36.0)
MCV: 93.6 fL (ref 80.0–100.0)
Platelets: 260 10*3/uL (ref 150–400)
RBC: 2.66 MIL/uL — ABNORMAL LOW (ref 4.22–5.81)
RDW: 15.3 % (ref 11.5–15.5)
WBC: 8.3 10*3/uL (ref 4.0–10.5)
nRBC: 0 % (ref 0.0–0.2)

## 2019-08-04 LAB — CULTURE, BLOOD (ROUTINE X 2)
Culture: NO GROWTH
Culture: NO GROWTH
Special Requests: ADEQUATE

## 2019-08-04 LAB — BASIC METABOLIC PANEL
Anion gap: 8 (ref 5–15)
BUN: 33 mg/dL — ABNORMAL HIGH (ref 8–23)
CO2: 28 mmol/L (ref 22–32)
Calcium: 8.7 mg/dL — ABNORMAL LOW (ref 8.9–10.3)
Chloride: 103 mmol/L (ref 98–111)
Creatinine, Ser: 1.55 mg/dL — ABNORMAL HIGH (ref 0.61–1.24)
GFR calc Af Amer: 55 mL/min — ABNORMAL LOW (ref >60)
GFR calc non Af Amer: 48 mL/min — ABNORMAL LOW (ref >60)
Glucose, Bld: 103 mg/dL — ABNORMAL HIGH (ref 70–99)
Potassium: 3.9 mmol/L (ref 3.5–5.1)
Sodium: 139 mmol/L (ref 135–145)

## 2019-08-04 LAB — MAGNESIUM: Magnesium: 2.2 mg/dL (ref 1.7–2.4)

## 2019-08-04 MED ORDER — OXYCODONE HCL 5 MG/5ML PO SOLN
5.0000 mg | ORAL | Status: DC | PRN
  Administered 2019-08-07 – 2019-08-08 (×5): 5 mg
  Filled 2019-08-04 (×5): qty 5

## 2019-08-04 MED ORDER — FUROSEMIDE 10 MG/ML IJ SOLN
40.0000 mg | Freq: Once | INTRAMUSCULAR | Status: AC
  Administered 2019-08-04: 40 mg via INTRAVENOUS
  Filled 2019-08-04: qty 4

## 2019-08-04 MED ORDER — DEXTROSE-NACL 5-0.45 % IV SOLN: INTRAVENOUS | Status: DC

## 2019-08-04 NOTE — Progress Notes (Signed)
[facility] Progress Note  Patient: Kyle [name]    ZGY:[id number]  DOA: 07/30/2019     Date of Service: the patient was seen and examined on 08/04/2019  Chief Complaint  Patient presents with  . Arm Pain   Brief hospital course: Initially presented on 04/30/2019 for acute CVA.  From there patient was discharged to rehab.  Patient signed out AMA from the rehab.  Had an aspiration event at home and was readmitted on 06/08/2019.  Discharged on 06/23/2019 to SNF. Now presenting with complaints of left arm swelling and pain.  Currently being admitted and treated for cellulitis. Currently further plan is continue IV antibiotics and follow-up on cultures.  Assessment and Plan: 1.  Left arm pain and swelling presumed 2/2 Cellulitis, very slowly improving Presents with uncontrolled pain ongoing for almost a week. EDP discussed with orthopedics as well as vascular surgery. CTA upper extremity limited to radial and ulnar arteries on the left only.  Remainder of the arterial structures including left subclavian of the left upper extremity within normal limits.  Edema is seen but no focal hematoma noted. Left upper extremity Doppler is also negative for any DVT. CRP mildly elevated.  ESR 128 though. Patient currently being treated with IV antibiotics will monitor cultures. At present we will continue to monitor.  Measurable left arm circumference. Discussed with hand surgery at [facility] currently recommending conservative measures appreciate their assistance. --continue ceftriaxone and vanc  2.  Acute kidney injury, POA Presented with a serum creatinine of 1.99. Baseline ~0.7.  Unclear etiology since Cr didn't improve significantly after MIVF.  Currently 1.5-1.6.  MIVF d/c'ed on 2/16.   PLAN: --Trial IV Lasix 40 mg x1 today, as pt appears to have some swelling and may have some vascular congestion. --US renal  3.  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 Continue tube feeding. --NPO but can have ice chip  PEG tube malfunction --PEG tube noted to be split and leaking --IR to replace   Chronic atrial fibrillation --on full tx dose of Lovenox --consider transition to [place]  4.  Essential hypertension Continue metoprolol.  Monitor.  5. Recent large right thigh hematoma Monitor  # Chronic pain and chronic anxiety --Pt is on a lot of opioids and benzos PTA.   --Pt needs to wake up to request pain meds.   Diet: N.p.o., on tube feedings but can have ice chips DVT Prophylaxis: Therapeutic Anticoagulation with Lovenox   Advance goals of care discussion: DNR per prior discussion with palliative care and the nephew.  Family Communication: not today  Disposition:  Pt is from SNF, admitted with cellulitis, still needs IV antibiotics, cellulitis starting to improve, so will switch to oral tomorrow.  Will also need PEG tube exchanged.  Likely d/c back to SNF in 1-2 days.   Subjective:  Pt again indicated the pain was about the same in his left arm.  Was focused today on showing me the knots left over from Lovenox shots and what he wanted done about it.  No noted fever, N/V/D.   Physical Exam: Constitutional: NAD, AAOx3, complete expressive aphasia, but comprehends.  Found sleeping but easily awoken.   HEENT: conjunctivae and lids normal, EOMI, mucosa dry CV: RRR no M,R,G. Distal pulses +2.  No cyanosis.   RESP: Diffuse rhonchi, gurgled breath sounds, normal respiratory effort  GI: +BS, NTND Extremities: Swelling and warmth in entire left arm improved  SKIN: warm, dry and intact    Vitals:   08/03/19 1605 08/04/19 0000 08/04/19 0800 08/04/19 1341  BP: 140/84 133/76 137/80 140/87  Pulse: 72 72 79 80  Resp: '17 20 20 17  '$ Temp: 98.4 F (36.9 C) (!) 97.5 F (36.4 C) 98.1 F (36.7 C) 98.4 F (36.9 C)  TempSrc: Oral   Oral  SpO2: 100%  94% 97%  Weight:       Height:        Intake/Output Summary (Last 24 hours) at 08/04/2019 1847 Last data filed at 08/04/2019 1821 Gross per 24 hour  Intake 100 ml  Output 2250 ml  Net -2150 ml   Filed Weights   07/30/19 0838 07/30/19 1747  Weight: (!) 149.7 kg (!) 159.5 kg    Data Reviewed: I have personally reviewed and interpreted daily labs, tele strips, imagings as discussed above. I reviewed all nursing notes, pharmacy notes, vitals, pertinent old records I have discussed plan of care as described above with RN and patient/family.  CBC: Recent Labs  Lab 07/30/19 0832 07/30/19 0832 07/31/19 0309 08/01/19 0500 08/02/19 0608 08/03/19 0444 08/04/19 0455  WBC 6.1   < > 6.5 8.2 7.1 8.4 8.3  NEUTROABS 4.1  --   --   --   --   --   --   HGB 7.8*   < > 8.1* 7.5* 7.3* 7.7* 7.8*  HCT 24.9*   < > 25.3* 22.9* 23.3* 24.2* 24.9*  MCV 94.0   < > 93.0 92.3 93.2 93.1 93.6  PLT 217   < > 209 201 222 250 260   < > = values in this interval not displayed.   Basic Metabolic Panel: Recent Labs  Lab 07/31/19 0309 08/01/19 0500 08/02/19 0608 08/03/19 0444 08/04/19 0455  NA 136 139 140 138 139  K 4.1 3.7 3.7 3.7 3.9  CL 104 106 105 104 103  CO2 '24 28 28 28 28  '$ GLUCOSE 102* 118* 106* 120* 103*  BUN 30* 29* 29* 29* 33*  CREATININE 1.95* 1.67* 1.66* 1.67* 1.55*  CALCIUM 8.9 8.7* 8.6* 8.6* 8.7*  MG  --   --  1.8 2.1 2.2    Studies: No results found.  Scheduled Meds: . ALPRAZolam  0.5 mg Per Tube TID  . baclofen  5 mg Per Tube BID  . chlorhexidine  15 mL Mouth Rinse BID  . Chlorhexidine Gluconate Cloth  6 each Topical Daily  . feeding supplement (PRO-STAT SUGAR FREE 64)  30 mL Per Tube BID  . free water  200 mL Per Tube Q6H  . gabapentin  600 mg Per Tube QID  . glycopyrrolate  1 mg Oral BID  . mouth rinse  15 mL Mouth Rinse q12n4p  . metoprolol tartrate  12.5 mg Per Tube BID  . nystatin  1 mL Oral TID  . pantoprazole sodium  40 mg Per Tube Daily  . sertraline  20 mg Per Tube Daily  .  simethicone  160 mg Per Tube QID   Continuous Infusions: . cefTRIAXone (ROCEPHIN)  IV Stopped (08/04/19 1821)  . feeding supplement (JEVITY 1.5 CAL/FIBER) 1,000 mL (08/01/19 0921)  . vancomycin Stopped (08/04/19 0700)   PRN Meds: acetaminophen, albuterol, ALPRAZolam, cyclobenzaprine, hydrALAZINE, ondansetron (ZOFRAN) IV, oxyCODONE   Enzo Bi, MD  08/04/2019 6:47 PM  To reach On-call, see care teams to locate the attending and reach out to them via [email or web address]. If 7PM-7AM, please contact night-coverage If you still have difficulty reaching the attending provider,  please page the [facility] (Director on Call) for [facility] on amion for assistance.

## 2019-08-04 NOTE — Progress Notes (Deleted)
The nurse attempted to call report to facility nurse.  No answer at facility.  Will continue to try to call

## 2019-08-04 NOTE — NC FL2 (Signed)
[place] LEVEL OF CARE SCREENING TOOL     IDENTIFICATION  Patient Name: Kyle [name] Birthdate: Jul 23, 1955 Sex: male Admission Date (Current Location): 07/30/2019  [place] and [place] Number:  Engineering geologist and Address:  [facility], [address] [place], [place] [address]      Provider Number: [id number]  Attending Physician Name and Address:  Enzo Bi, MD  Relative Name and Phone Number:  [name] cell [phone]    Current Level of Care: Hospital Recommended Level of Care: [place] Prior Approval Number:    Date Approved/Denied:   PASRR Number: [id number]  Discharge Plan: SNF    Current Diagnoses: Patient Active Problem List   Diagnosis Date Noted  . Cellulitis 07/31/2019  . Left arm pain 07/30/2019  . AKI (acute kidney injury) ([place]) 07/30/2019  . Normocytic anemia 07/30/2019  . Right anterior knee pain   . Hematoma of leg, right, subsequent encounter   . Hypernatremia   . Aspiration into airway 06/08/2019  . Aspiration pneumonia ([place])   . Dysphagia due to recent cerebrovascular accident 05/18/2019  . Tobacco use disorder 05/18/2019  . Chronic pain syndrome 05/18/2019  . Stroke ([place]) R MCA emb d/t AF not on [facility] s/p partial revascularization w/ IR 04/30/2019  . Middle cerebral artery embolism, right 04/30/2019  . Chronic atrial fibrillation ([place]) 08/26/2015  . Hyperlipidemia LDL goal <100 06/26/2014  . Routine general medical examination at a health care facility 11/21/2013  . Screening PSA (prostate specific antigen) 11/21/2013  . Special screening for malignant neoplasms, colon 11/21/2013  . OSA (obstructive sleep apnea) 08/22/2013  . Morbid obesity ([place]) 04/19/2013  . Other emphysema ([place]) 01/31/2013  . Essential hypertension 12/21/2012  . Insomnia 12/21/2012  . GERD (gastroesophageal reflux disease) 09/20/2012  . Back pain, chronic 09/20/2012  . Obesity hypoventilation syndrome  ([place]) 09/20/2012  . Adjustment disorder with mixed anxiety and depressed mood 09/20/2012    Orientation RESPIRATION BLADDER Height & Weight     Self, Time, Situation, Place  Normal Continent Weight: (!) 351 lb 10.1 oz (159.5 kg) Height:  6\' 5"  (195.6 cm)  BEHAVIORAL SYMPTOMS/MOOD NEUROLOGICAL BOWEL NUTRITION STATUS      Continent Feeding tube  AMBULATORY STATUS COMMUNICATION OF NEEDS Skin   Limited Assist Non-Verbally Surgical wounds(New PEG tube placed)                       Personal Care Assistance Level of Assistance  Bathing, Feeding, Dressing Bathing Assistance: Limited assistance Feeding assistance: Maximum assistance(PEG feedings) Dressing Assistance: Limited assistance     Functional Limitations Info  Speech     Speech Info: Impaired    SPECIAL CARE FACTORS FREQUENCY  Speech therapy             Speech Therapy Frequency: 2x week      Contractures Contractures Info: Present    Additional Factors Info  Code Status, Allergies Code Status Info: DNR Allergies Info: Vioxx           Current Medications (08/04/2019):  This is the current hospital active medication list Current Facility-Administered Medications  Medication Dose Route Frequency Provider Last Rate Last Admin  . acetaminophen (TYLENOL) tablet 650 mg  650 mg Per Tube Q6H PRN Ivor Costa, MD      . albuterol (PROVENTIL) (2.5 MG/3ML) 0.083% nebulizer solution 2.5 mg  2.5 mg Nebulization Q4H PRN Charlett Nose, RPH      . ALPRAZolam Duanne Moron) tablet 0.5  mg  0.5 mg Per Tube TID Ivor Costa, MD   0.5 mg at 08/04/19 0953  . ALPRAZolam Duanne Moron) tablet 1 mg  1 mg Oral BID PRN Ivor Costa, MD   1 mg at 08/01/19 0433  . baclofen (LIORESAL) tablet 5 mg  5 mg Per Tube BID Lavina Hamman, MD   5 mg at 08/04/19 0953  . cefTRIAXone (ROCEPHIN) 2 g in sodium chloride 0.9 % 100 mL IVPB  2 g Intravenous q1800 Ivor Costa, MD 200 mL/hr at 08/03/19 1726 2 g at 08/03/19 1726  . chlorhexidine (PERIDEX) 0.12 % solution  15 mL  15 mL Mouth Rinse BID Modena Jansky, MD   15 mL at 08/04/19 0952  . Chlorhexidine Gluconate Cloth 2 % PADS 6 each  6 each Topical Daily Lavina Hamman, MD   6 each at 08/04/19 [phone]  . cyclobenzaprine (FLEXERIL) tablet 10 mg  10 mg Per Tube TID PRN Ivor Costa, MD   10 mg at 08/03/19 0144  . feeding supplement (JEVITY 1.5 CAL/FIBER) liquid 1,000 mL  1,000 mL Oral Continuous Lavina Hamman, MD 70 mL/hr at 08/01/19 0921 1,000 mL at 08/01/19 0921  . feeding supplement (PRO-STAT SUGAR FREE 64) liquid 30 mL  30 mL Per Tube BID Lavina Hamman, MD   30 mL at 08/04/19 0954  . free water 200 mL  200 mL Per Tube Q6H Ivor Costa, MD   200 mL at 08/04/19 1331  . gabapentin (NEURONTIN) 250 MG/5ML solution 600 mg  600 mg Per Tube QID Ivor Costa, MD   600 mg at 08/04/19 1331  . glycopyrrolate (ROBINUL) tablet 1 mg  1 mg Oral BID Ivor Costa, MD   1 mg at 08/03/19 2230  . hydrALAZINE (APRESOLINE) injection 5 mg  5 mg Intravenous Q2H PRN Ivor Costa, MD      . MEDLINE mouth rinse  15 mL Mouth Rinse q12n4p Modena Jansky, MD   15 mL at 08/04/19 1338  . metoprolol tartrate (LOPRESSOR) tablet 12.5 mg  12.5 mg Per Tube BID Charlett Nose, RPH   12.5 mg at 08/04/19 0953  . nystatin (MYCOSTATIN) 100000 UNIT/ML suspension 100,000 Units  1 mL Oral TID Ivor Costa, MD   100,000 Units at 08/03/19 2230  . ondansetron (ZOFRAN) injection 4 mg  4 mg Intravenous Q8H PRN Ivor Costa, MD      . oxyCODONE (ROXICODONE) 5 MG/5ML solution 5 mg  5 mg Per Tube Q4H PRN Enzo Bi, MD      . pantoprazole sodium (PROTONIX) 40 mg/20 mL oral suspension 40 mg  40 mg Per Tube Daily Ivor Costa, MD   40 mg at 08/04/19 0956  . sertraline (ZOLOFT) 20 MG/ML concentrated solution 20 mg  20 mg Per Tube Daily Ivor Costa, MD   20 mg at 08/04/19 0953  . simethicone (MYLICON) 40 0000000 suspension 160 mg  160 mg Per Tube QID Lavina Hamman, MD   160 mg at 08/04/19 1331  . vancomycin (VANCOREADY) IVPB 2000 mg/400 mL  2,000 mg Intravenous Q24H  Hart Robinsons A, RPH 200 mL/hr at 08/04/19 [id number] 2,000 mg at 08/04/19 [id number]     Discharge Medications: Please see discharge summary for a list of discharge medications.  Relevant Imaging Results:  Relevant Lab Results:   Additional Information SSN: [id number]  Sanjiv Castorena, [place], LCSW

## 2019-08-04 NOTE — Plan of Care (Signed)

## 2019-08-04 NOTE — Progress Notes (Signed)
The nurse messaged Dr. Billie Ruddy regarding PEG tubing being split and tube feeding leaking even though there is tape around the tube feeding tubing and connection.

## 2019-08-04 NOTE — Progress Notes (Signed)
Pharmacy Antibiotic Note  Kyle [name] is a 64 y.o. male admitted on 07/30/2019 with cellulitis.  Pharmacy has been consulted for Vancomycin dosing.  Plan: Pharmacy consulted to dose vancomycin in this 64 year old male admitted with cellulitis, AKI.    Of note, pts baseline SrCr is ~ 0.6 - 0.7  SCr on 2/16-2/19 = 1.67,1.66,1.67,1.55 (new baseline?)   Will continue Vancomycin 2000 mg IV Q24H   Vancomycin 2000 mg IV Q 24 hrs.  Goal AUC 400-550. Expected AUC: 441 SCr used: 1.55  If pt remains on vancomycin, will check a peak on 2/20 1 hour after completion of infusion and a trough on 2/21 just prior to 0500 dose.   Continue Ceftriaxone 2g q 24  Height: 6\' 5"  (195.6 cm) Weight: (!) 351 lb 10.1 oz (159.5 kg) IBW/kg (Calculated) : 89.1  Temp (24hrs), Avg:98 F (36.7 C), Min:97.5 F (36.4 C), Max:98.4 F (36.9 C)  Recent Labs  Lab 07/30/19 0832 07/30/19 0832 07/30/19 1042 07/31/19 0309 08/01/19 0500 08/02/19 0608 08/03/19 0444 08/04/19 0455  WBC 6.1   < >  --  6.5 8.2 7.1 8.4 8.3  CREATININE 1.99*   < >  --  1.95* 1.67* 1.66* 1.67* 1.55*  LATICACIDVEN 0.6  --  0.8  --   --   --   --   --    < > = values in this interval not displayed.    Estimated Creatinine Clearance: 83 mL/min (A) (by C-G formula based on SCr of 1.55 mg/dL (H)).    Allergies  Allergen Reactions  . Vioxx [Rofecoxib] Rash   Antimicrobials this admission: Vancomycin 2/15 x 1, then 2/17 >>  Rocephin 2/14  >>   Dose adjustments this admission:  Microbiology results: 2/14 BCx: NG x 5 days 2/14 MRSA PCR: NEG COVID NEG, FLU NEG  Thank you for allowing pharmacy to be a part of this patient's care.  Lu Duffel, PharmD, BCPS Clinical Pharmacist 08/04/2019 7:33 AM

## 2019-08-05 ENCOUNTER — Inpatient Hospital Stay: Payer: Medicare PPO

## 2019-08-05 LAB — GLUCOSE, CAPILLARY
Glucose-Capillary: 85 mg/dL (ref 70–99)
Glucose-Capillary: 90 mg/dL (ref 70–99)
Glucose-Capillary: 92 mg/dL (ref 70–99)
Glucose-Capillary: 80 mg/dL (ref 70–99)
Glucose-Capillary: 86 mg/dL (ref 70–99)
Glucose-Capillary: 94 mg/dL (ref 70–99)
Glucose-Capillary: 81 mg/dL (ref 70–99)

## 2019-08-05 LAB — CBC
HCT: 25.3 % — ABNORMAL LOW (ref 39.0–52.0)
Hemoglobin: 7.9 g/dL — ABNORMAL LOW (ref 13.0–17.0)
MCH: 29.3 pg (ref 26.0–34.0)
MCHC: 31.2 g/dL (ref 30.0–36.0)
MCV: 93.7 fL (ref 80.0–100.0)
Platelets: 288 10*3/uL (ref 150–400)
RBC: 2.7 MIL/uL — ABNORMAL LOW (ref 4.22–5.81)
RDW: 14.9 % (ref 11.5–15.5)
WBC: 6.7 10*3/uL (ref 4.0–10.5)
nRBC: 0 % (ref 0.0–0.2)

## 2019-08-05 LAB — MAGNESIUM: Magnesium: 2 mg/dL (ref 1.7–2.4)

## 2019-08-05 LAB — BASIC METABOLIC PANEL
Anion gap: 10 (ref 5–15)
BUN: 33 mg/dL — ABNORMAL HIGH (ref 8–23)
CO2: 27 mmol/L (ref 22–32)
Calcium: 9 mg/dL (ref 8.9–10.3)
Chloride: 102 mmol/L (ref 98–111)
Creatinine, Ser: 1.55 mg/dL — ABNORMAL HIGH (ref 0.61–1.24)
GFR calc Af Amer: 55 mL/min — ABNORMAL LOW (ref >60)
GFR calc non Af Amer: 48 mL/min — ABNORMAL LOW (ref >60)
Glucose, Bld: 94 mg/dL (ref 70–99)
Potassium: 3.6 mmol/L (ref 3.5–5.1)
Sodium: 139 mmol/L (ref 135–145)

## 2019-08-05 LAB — VANCOMYCIN, PEAK: Vancomycin Pk: 53 ug/mL (ref 30–40)

## 2019-08-05 IMAGING — US US RENAL
1 series · 14 of 25 positions shown · non-contrast
Comparison: None.

CLINICAL DATA: Inpatient.  Acute kidney injury.

EXAM:
RENAL / URINARY TRACT ULTRASOUND COMPLETE

[Series 1: us renal · 14 of 32 slices shown]
[im 1/32]
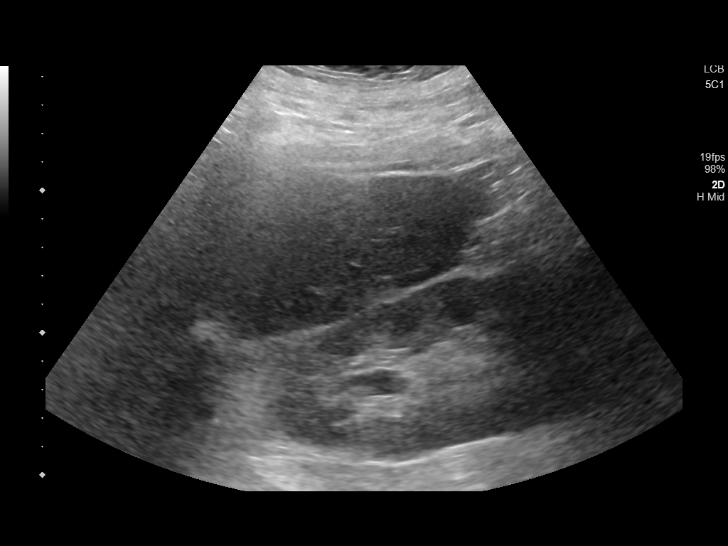
[im 3/32]
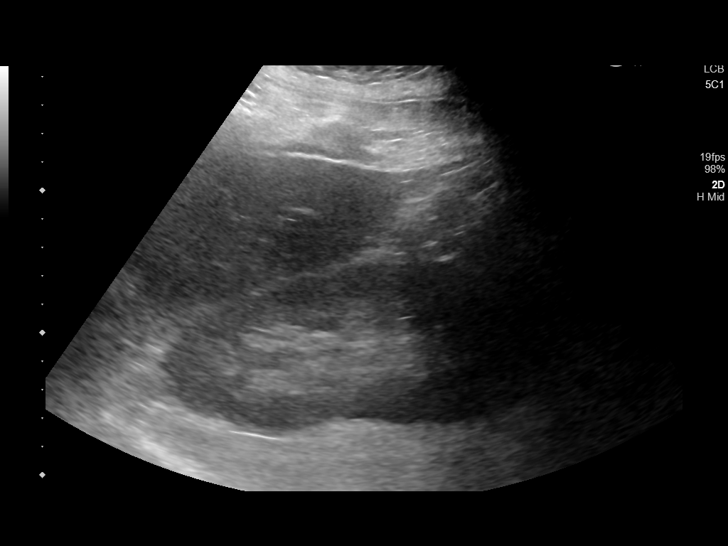
[im 6/32]
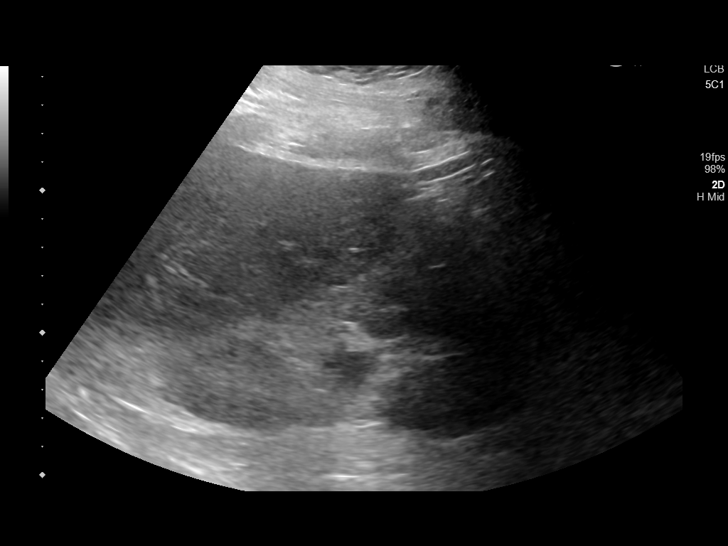
[im 8/32]
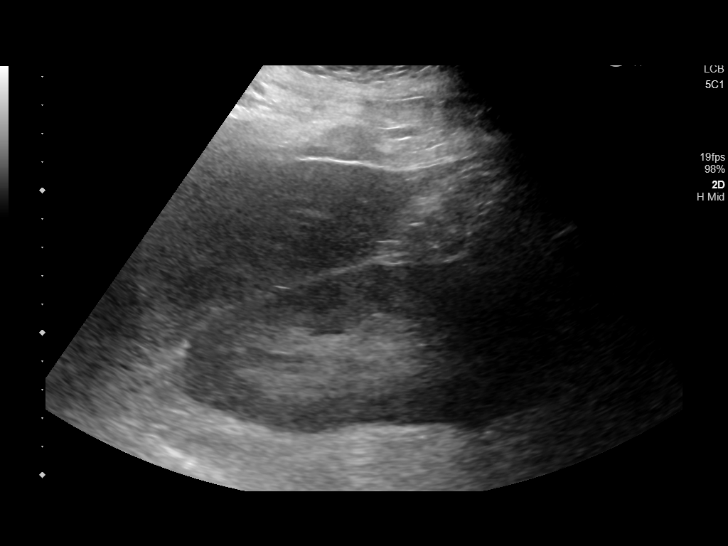
[im 11/32]
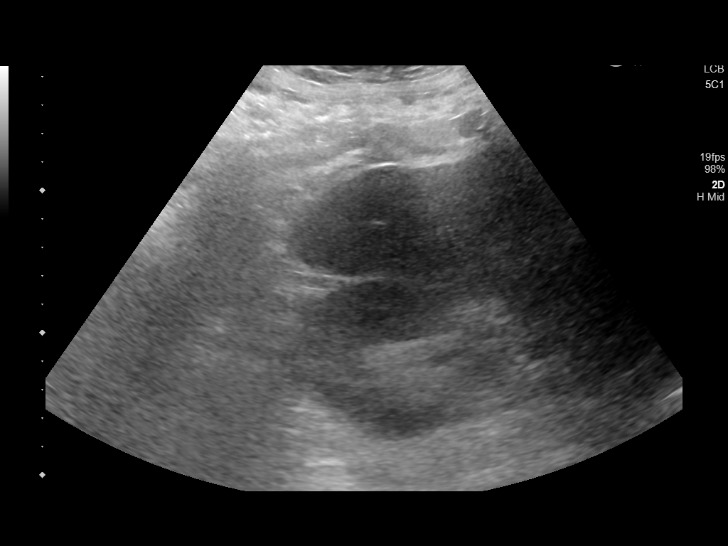
[im 12/32]
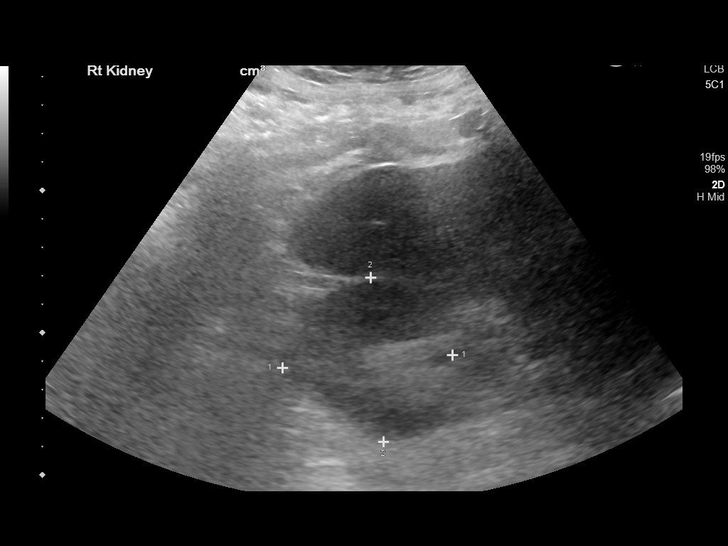
[im 15/32]
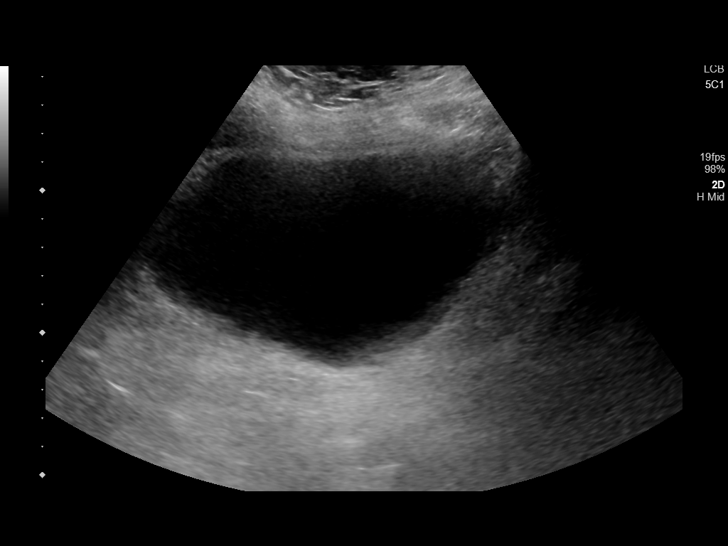
[im 17/32]
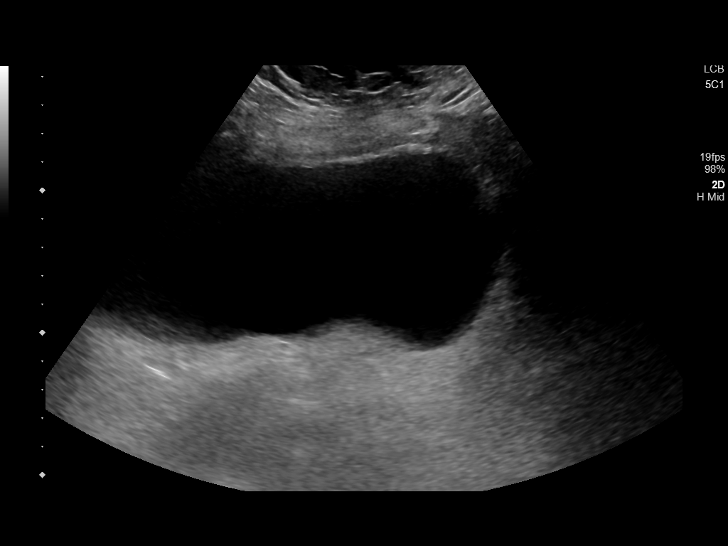
[im 20/32]
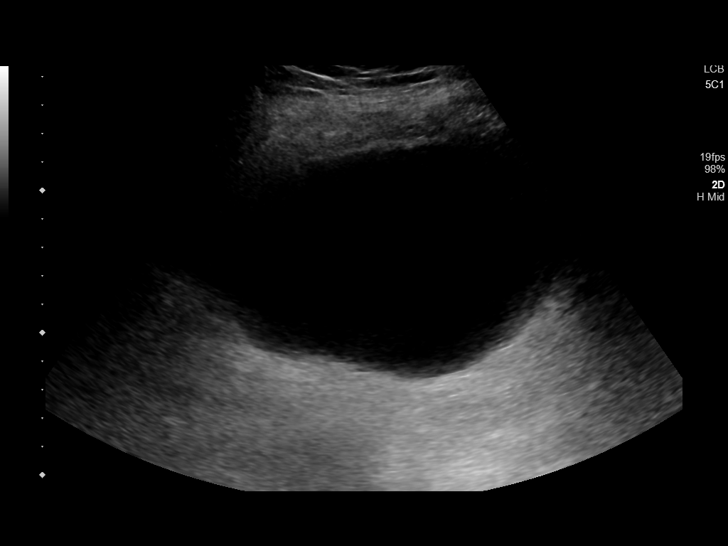
[im 21/32]
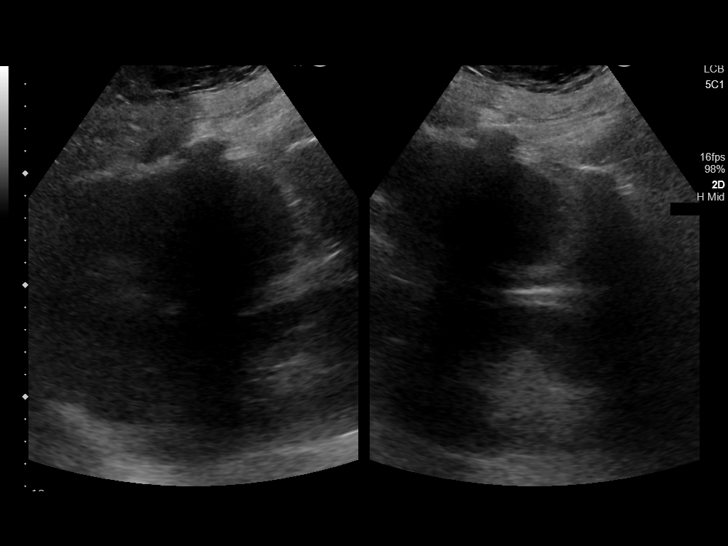
[im 24/32]
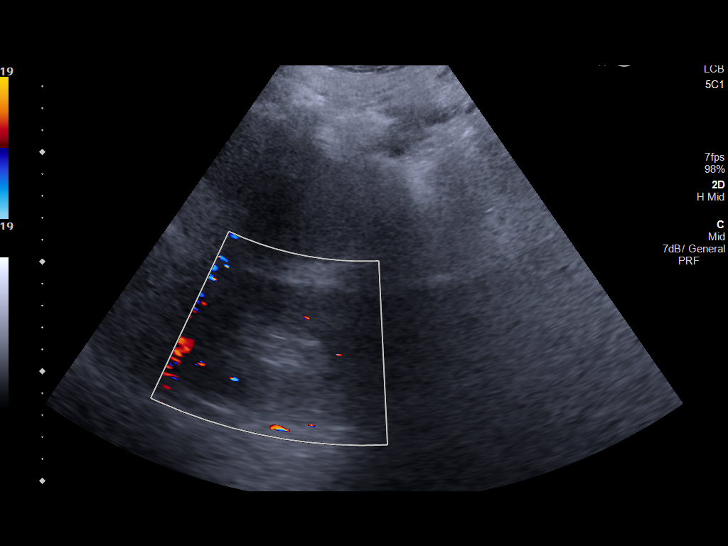
[im 26/32]
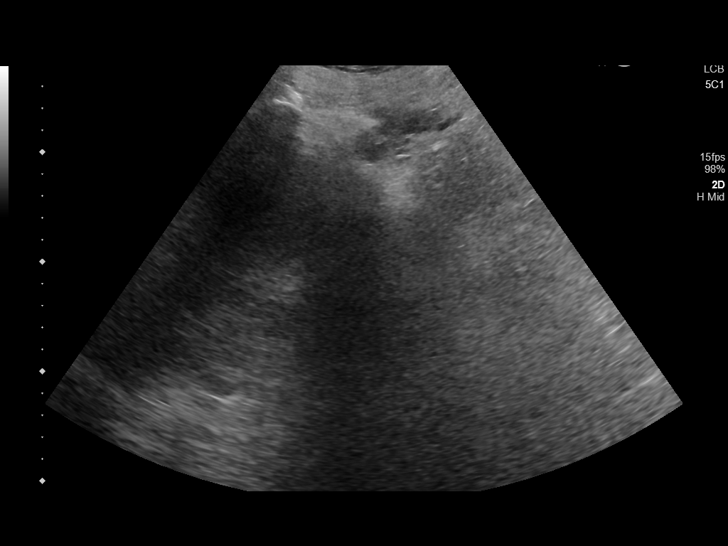
[im 29/32]
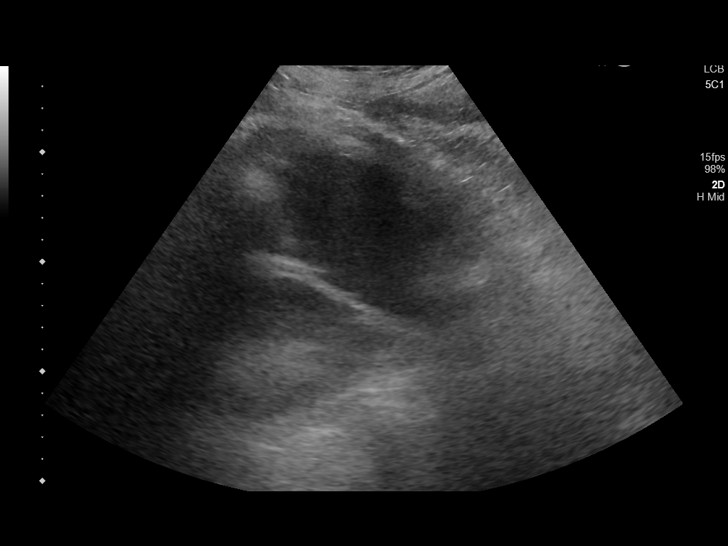
[im 32/32]
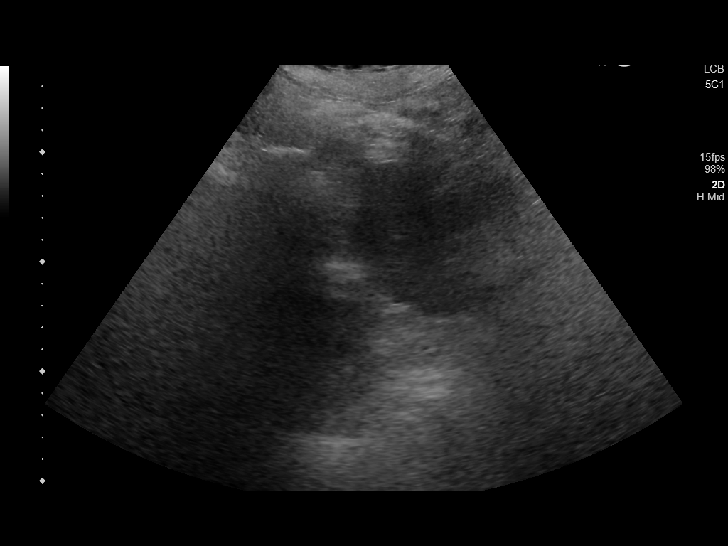

[14 of 25 positions shown; findings below may reference images not displayed]

FINDINGS: Right Kidney:

Renal measurements: 13.3 x 6.0 x 5.8 cm = volume: 241 mL. No
hydronephrosis. Mildly echogenic renal parenchyma, normal thickness.
No renal mass.

Left Kidney:

Renal measurements: 13.3 x 7.4 x 6.3 cm = volume: 327 mL. No
hydronephrosis. Mildly echogenic renal parenchyma, normal thickness.
No renal mass.

Bladder:

Appears normal for degree of bladder distention.

Other:

None.
IMPRESSION: 1. No hydronephrosis.
2. Mildly echogenic normal size kidneys, compatible with reported
history of nonspecific acute renal parenchymal disease.
3. Normal bladder.

## 2019-08-05 IMAGING — DX DG CHEST 1V
2 series · 2 of 2 positions shown · non-contrast
Comparison: [DATE]

CLINICAL DATA: Shortness of breath

EXAM:
CHEST  1 VIEW

[chest ap (1 of 2)]
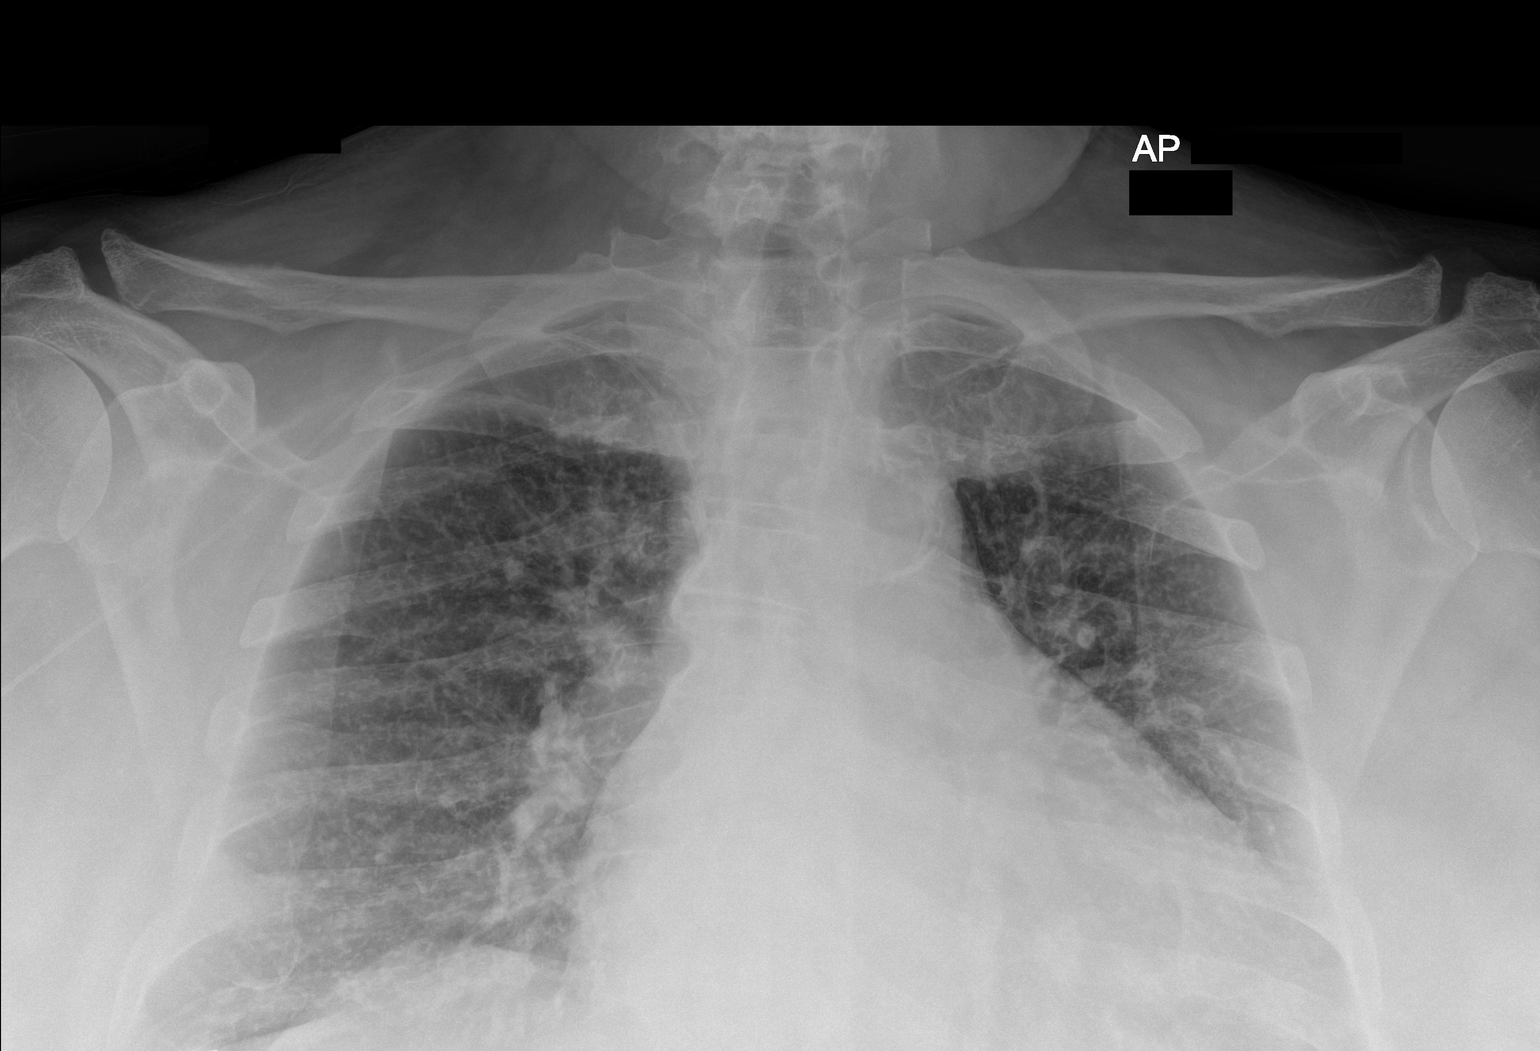

[chest ap (2 of 2)]
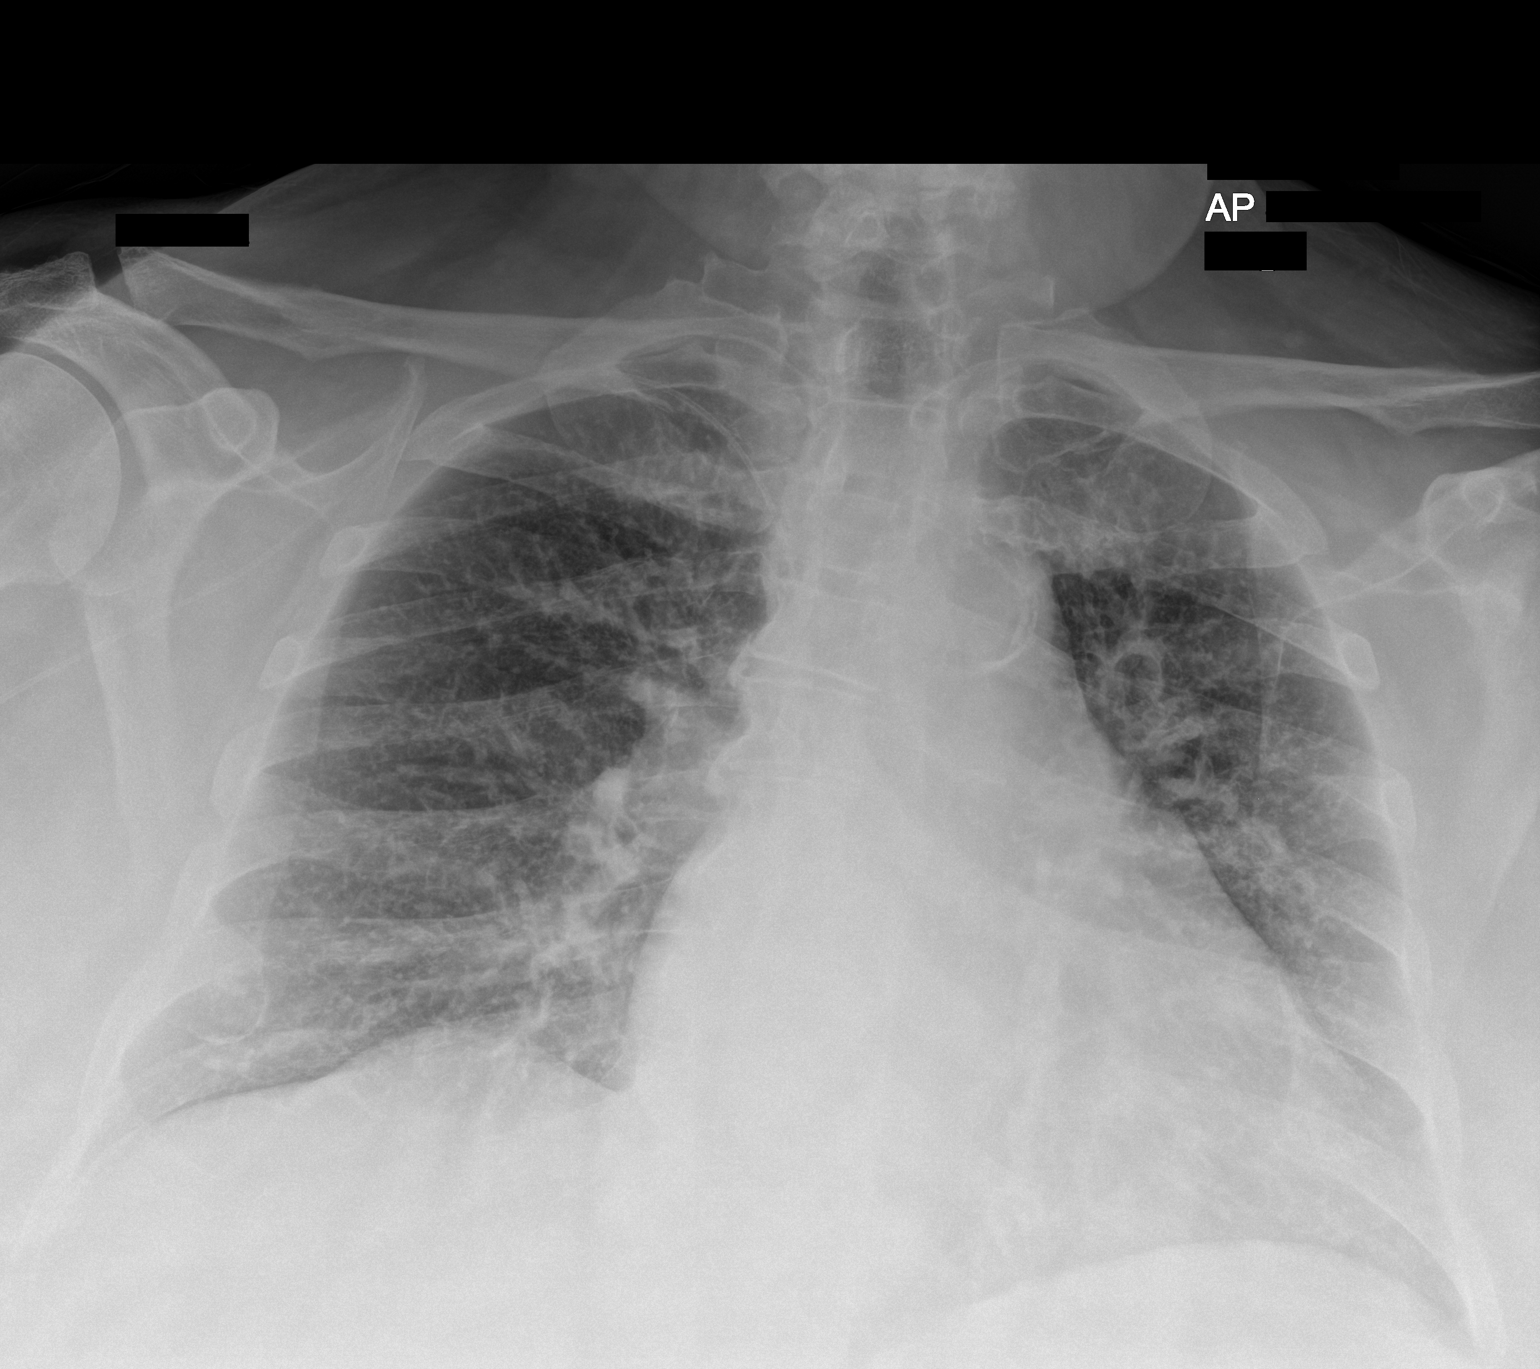

[2 of 2 positions shown; findings below may reference images not displayed]

FINDINGS: Cardiomegaly. Right arm PICC tip in the SVC above the right atrium.
Pulmonary venous hypertension with early interstitial edema. Aortic
atherosclerosis. No consolidation, collapse or effusion.
IMPRESSION: Cardiomegaly.  Interstitial pulmonary edema.

## 2019-08-05 MED ORDER — MORPHINE SULFATE (PF) 2 MG/ML IV SOLN
1.0000 mg | INTRAVENOUS | Status: DC | PRN
  Administered 2019-08-05 – 2019-08-07 (×8): 1 mg via INTRAVENOUS
  Filled 2019-08-05 (×8): qty 1

## 2019-08-05 MED ORDER — LORAZEPAM 2 MG/ML IJ SOLN
0.5000 mg | Freq: Three times a day (TID) | INTRAMUSCULAR | Status: DC
  Administered 2019-08-05 – 2019-08-07 (×6): 0.5 mg via INTRAVENOUS
  Filled 2019-08-05 (×6): qty 1

## 2019-08-05 MED ORDER — FUROSEMIDE 10 MG/ML IJ SOLN
40.0000 mg | Freq: Once | INTRAMUSCULAR | Status: AC
  Administered 2019-08-05: 40 mg via INTRAVENOUS
  Filled 2019-08-05: qty 4

## 2019-08-05 MED ORDER — SODIUM CHLORIDE 0.9 % IV SOLN
100.0000 mg | Freq: Two times a day (BID) | INTRAVENOUS | Status: DC
  Administered 2019-08-05 – 2019-08-08 (×6): 100 mg via INTRAVENOUS
  Filled 2019-08-05 (×8): qty 100

## 2019-08-05 NOTE — Plan of Care (Signed)

## 2019-08-05 NOTE — Progress Notes (Signed)
[facility] Progress Note  Patient: Kyle [name]    CWU:[id number]  DOA: 07/30/2019     Date of Service: the patient was seen and examined on 08/05/2019  Chief Complaint  Patient presents with  . Arm Pain   Brief hospital course: Initially presented on 04/30/2019 for acute CVA.  From there patient was discharged to rehab.  Patient signed out AMA from the rehab.  Had an aspiration event at home and was readmitted on 06/08/2019.  Discharged on 06/23/2019 to SNF. Now presenting with complaints of left arm swelling and pain.  Currently being admitted and treated for cellulitis. Currently further plan is continue IV antibiotics and follow-up on cultures.  Assessment and Plan: 1.  Left arm pain and swelling presumed 2/2 Cellulitis, very slowly improving Presents with uncontrolled pain ongoing for almost a week. EDP discussed with orthopedics as well as vascular surgery. CTA upper extremity limited to radial and ulnar arteries on the left only.  Remainder of the arterial structures including left subclavian of the left upper extremity within normal limits.  Edema is seen but no focal hematoma noted. Left upper extremity Doppler is also negative for any DVT. CRP mildly elevated.  ESR 128 though. Patient currently being treated with IV antibiotics will monitor cultures. At present we will continue to monitor.  Measurable left arm circumference. Discussed with hand surgery at [facility] currently recommending conservative measures appreciate their assistance. --Received 6 days of IV abx with ceftriaxone and vanc --Switch to IV doxy today for 4 more days  2.  Acute kidney injury, POA Presented with a serum creatinine of 1.99. Baseline ~0.7.  Unclear etiology since Cr didn't improve significantly after MIVF.  Currently 1.5-1.6.  MIVF d/c'ed on 2/16.   --Trial IV Lasix 40 mg x1 on 2/19, as pt appears to have some swelling and may have some vascular congestion. --US renal showed no  hydro, but mildly echogenic kidneys PLAN: --continue IV lasix 40 mg today --No IVF  3.  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 --NPO but can have ice chip  PEG tube malfunction --PEG tube noted to be split and leaking --IR to replace on Monday  Chronic atrial fibrillation --on full tx dose of Lovenox --consider transition to [place]  4.  Essential hypertension Continue metoprolol.  Monitor.  5. Recent large right thigh hematoma Monitor  # Chronic pain and chronic anxiety --Pt is on a lot of opioids and benzos PTA.   --Pt needs to wake up to request pain meds.   Diet: N.p.o., on tube feedings but can have ice chips DVT Prophylaxis: Therapeutic Anticoagulation with Lovenox   Advance goals of care discussion: DNR per prior discussion with palliative care and the nephew.  Family Communication: not today  Disposition:  Pt is from SNF, admitted with cellulitis, still needs IV antibiotics, cellulitis starting to improve.  Will also need PEG tube exchanged on Monday.  Will discharge back to SNF after.   Subjective:  Still no change in his left arm, per pt.  Pt got very upset and teary and I finally figured out he was missing his "nerve" medication due to PEG tube malfunctioning.  IV ativan and IV morphine ordered.  No fever, N/V/D.   Physical Exam: Constitutional: NAD, AAOx3, complete expressive aphasia, but comprehends.  Found sleeping but easily awoken.  Broke out into tears during rounds. HEENT: conjunctivae and lids normal, EOMI, mucosa dry CV: RRR  no M,R,G. Distal pulses +2.  No cyanosis.   RESP: Diffuse rhonchi, gurgled breath sounds, normal respiratory effort, on RA GI: +BS, NTND Extremities: Swelling and warmth in entire left arm improved SKIN: warm, dry and intact    Vitals:   08/04/19 2015 08/04/19 2309 08/05/19 0023 08/05/19 0903  BP: (!) 150/86 (!) 144/74 (!) 164/86 (!) 169/88    Pulse: 66 67 71 79  Resp: _0 Temp: 98.4 F (36.9 C)  (!) 97.5 F (36.4 C) 97.9 F (36.6 C)  TempSrc: Oral  Oral Oral  SpO2: 99% 98% 98% 97%  Weight:      Height:        Intake/Output Summary (Last 24 hours) at 08/05/2019 1524 Last data filed at 08/05/2019 0524 Gross per 24 hour  Intake 100 ml  Output 550 ml  Net -450 ml   Filed Weights   07/30/19 0838 07/30/19 1747  Weight: (!) 149.7 kg (!) 159.5 kg    Data Reviewed: I have personally reviewed and interpreted daily labs, tele strips, imagings as discussed above. I reviewed all nursing notes, pharmacy notes, vitals, pertinent old records I have discussed plan of care as described above with RN and patient/family.  CBC: Recent Labs  Lab 07/30/19 0832 07/31/19 0309 08/01/19 0500 08/02/19 0608 08/03/19 0444 08/04/19 0455 08/05/19 0913  WBC 6.1   < > 8.2 7.1 8.4 8.3 6.7  NEUTROABS 4.1  --   --   --   --   --   --   HGB 7.8*   < > 7.5* 7.3* 7.7* 7.8* 7.9*  HCT 24.9*   < > 22.9* 23.3* 24.2* 24.9* 25.3*  MCV 94.0   < > 92.3 93.2 93.1 93.6 93.7  PLT 217   < > 201 222 250 260 288   < > = values in this interval not displayed.   Basic Metabolic Panel: Recent Labs  Lab 08/01/19 0500 08/02/19 0608 08/03/19 0444 08/04/19 0455 08/05/19 0913  NA 139 140 138 139 139  K 3.7 3.7 3.7 3.9 3.6  CL 106 105 104 103 102  CO2 _1 GLUCOSE 118* 106* 120* 103* 94  BUN 29* 29* 29* 33* 33*  CREATININE 1.67* 1.66* 1.67* 1.55* 1.55*  CALCIUM 8.7* 8.6* 8.6* 8.7* 9.0  MG  --  1.8 2.1 2.2 2.0    Studies: DG Chest 1 View  Result Date: 08/05/2019 CLINICAL DATA:  Shortness of breath EXAM: CHEST  1 VIEW COMPARISON:  06/11/2019 FINDINGS: Cardiomegaly. Right arm PICC tip in the SVC above the right atrium. Pulmonary venous hypertension with early interstitial edema. Aortic atherosclerosis. No consolidation, collapse or effusion. IMPRESSION: Cardiomegaly.  Interstitial pulmonary edema. Electronically Signed   By: Nelson Chimes M.D.   On: 08/05/2019 02:17   US RENAL  Result Date: 08/05/2019 CLINICAL DATA:  Inpatient.  Acute kidney injury. EXAM: RENAL / URINARY TRACT ULTRASOUND COMPLETE COMPARISON:  None. FINDINGS: Right Kidney: Renal measurements: 13.3 x 6.0 x 5.8 cm = volume: 241 mL. No hydronephrosis. Mildly echogenic renal parenchyma, normal thickness. No renal mass. Left Kidney: Renal measurements: 13.3 x 7.4 x 6.3 cm = volume: 327 mL. No hydronephrosis. Mildly echogenic renal parenchyma, normal thickness. No renal mass. Bladder: Appears normal for degree of bladder distention. Other: None. IMPRESSION: 1. No hydronephrosis. 2. Mildly echogenic normal size kidneys, compatible with reported history of nonspecific acute renal parenchymal disease. 3. Normal bladder. Electronically Signed   By: Janina Mayo.D.  On: 08/05/2019 08:14    Scheduled Meds: . baclofen  5 mg Per Tube BID  . chlorhexidine  15 mL Mouth Rinse BID  . Chlorhexidine Gluconate Cloth  6 each Topical Daily  . feeding supplement (PRO-STAT SUGAR FREE 64)  30 mL Per Tube BID  . free water  200 mL Per Tube Q6H  . furosemide  40 mg Intravenous Once  . gabapentin  600 mg Per Tube QID  . glycopyrrolate  1 mg Oral BID  . LORazepam  0.5 mg Intravenous TID  . mouth rinse  15 mL Mouth Rinse q12n4p  . metoprolol tartrate  12.5 mg Per Tube BID  . nystatin  1 mL Oral TID  . pantoprazole sodium  40 mg Per Tube Daily  . sertraline  20 mg Per Tube Daily  . simethicone  160 mg Per Tube QID   Continuous Infusions: . feeding supplement (JEVITY 1.5 CAL/FIBER) 1,000 mL (08/01/19 0921)   PRN Meds: acetaminophen, albuterol, ALPRAZolam, cyclobenzaprine, hydrALAZINE, morphine injection, ondansetron (ZOFRAN) IV, oxyCODONE   Enzo Bi, MD  08/05/2019 3:24 PM  To reach On-call, see care teams to locate the attending and reach out to them via [email or web address]. If 7PM-7AM, please contact night-coverage If you still have difficulty reaching the attending provider,  please page the [facility] (Director on Call) for [facility] on amion for assistance.

## 2019-08-05 NOTE — Progress Notes (Signed)
Received verbal order from on call provider Rufina Falco NP for blood sugar check every 4 hours, due to no feedings or fluids. Fluids held at this time due to bilateral rales. Ordered received for chest x ray and duonebs as well.

## 2019-08-06 LAB — BASIC METABOLIC PANEL
Anion gap: 11 (ref 5–15)
BUN: 34 mg/dL — ABNORMAL HIGH (ref 8–23)
CO2: 28 mmol/L (ref 22–32)
Calcium: 9.4 mg/dL (ref 8.9–10.3)
Chloride: 100 mmol/L (ref 98–111)
Creatinine, Ser: 1.53 mg/dL — ABNORMAL HIGH (ref 0.61–1.24)
GFR calc Af Amer: 56 mL/min — ABNORMAL LOW (ref >60)
GFR calc non Af Amer: 48 mL/min — ABNORMAL LOW (ref >60)
Glucose, Bld: 99 mg/dL (ref 70–99)
Potassium: 3.1 mmol/L — ABNORMAL LOW (ref 3.5–5.1)
Sodium: 139 mmol/L (ref 135–145)

## 2019-08-06 LAB — CBC
HCT: 26.2 % — ABNORMAL LOW (ref 39.0–52.0)
Hemoglobin: 8.5 g/dL — ABNORMAL LOW (ref 13.0–17.0)
MCH: 29.3 pg (ref 26.0–34.0)
MCHC: 32.4 g/dL (ref 30.0–36.0)
MCV: 90.3 fL (ref 80.0–100.0)
Platelets: 321 10*3/uL (ref 150–400)
RBC: 2.9 MIL/uL — ABNORMAL LOW (ref 4.22–5.81)
RDW: 14.7 % (ref 11.5–15.5)
WBC: 6.2 10*3/uL (ref 4.0–10.5)
nRBC: 0 % (ref 0.0–0.2)

## 2019-08-06 LAB — GLUCOSE, CAPILLARY
Glucose-Capillary: 92 mg/dL (ref 70–99)
Glucose-Capillary: 90 mg/dL (ref 70–99)

## 2019-08-06 LAB — MAGNESIUM: Magnesium: 1.9 mg/dL (ref 1.7–2.4)

## 2019-08-06 MED ORDER — FUROSEMIDE 10 MG/ML IJ SOLN
40.0000 mg | Freq: Once | INTRAMUSCULAR | Status: AC
  Administered 2019-08-06: 40 mg via INTRAVENOUS
  Filled 2019-08-06: qty 4

## 2019-08-06 MED ORDER — POTASSIUM CHLORIDE 10 MEQ/100ML IV SOLN
10.0000 meq | INTRAVENOUS | Status: AC
  Administered 2019-08-06 (×4): 10 meq via INTRAVENOUS
  Filled 2019-08-06 (×4): qty 100

## 2019-08-06 NOTE — Progress Notes (Signed)
Patient had multiple meds due at bedtime that could not be given due to his peg tube not working. Plan to fix Peg would take place on Monday. Notified on call NP Ouma regarding BP being elevated and one of his meds metoprolol still being oral. Patient had prn for hydralazine so that was given. Per Ouma if BP remained elevated a second dose could be given. Patient also medicated with PRN IV pain medication as schedule pain meds could not be given due to Peg not working. Patient remains NPO. Blood sugar at bedtime was 81. Ouma NP Notified. Will continue to monitor.

## 2019-08-06 NOTE — Progress Notes (Signed)
[facility] Progress Note  Patient: Kyle [name]    HXT:[id number]  DOA: 07/30/2019     Date of Service: the patient was seen and examined on 08/06/2019  Chief Complaint  Patient presents with  . Arm Pain   Brief hospital course: Initially presented on 04/30/2019 for acute CVA.  From there patient was discharged to rehab.  Patient signed out AMA from the rehab.  Had an aspiration event at home and was readmitted on 06/08/2019.  Discharged on 06/23/2019 to SNF. Now presenting with complaints of left arm swelling and pain.  Currently being admitted and treated for cellulitis. Currently further plan is continue IV antibiotics and follow-up on cultures.  Assessment and Plan: 1.  Left arm pain and swelling  2/2 Cellulitis AND edema, very slowly improving Presents with uncontrolled pain ongoing for almost a week. EDP discussed with orthopedics as well as vascular surgery. CTA upper extremity limited to radial and ulnar arteries on the left only.  Remainder of the arterial structures including left subclavian of the left upper extremity within normal limits.  Edema is seen but no focal hematoma noted. Left upper extremity Doppler is also negative for any DVT. CRP mildly elevated.  ESR 128 though. Patient currently being treated with IV antibiotics  Discussed with hand surgery at [facility] currently recommending conservative measures appreciate their assistance. PLAN: --Received 6 days of IV abx with ceftriaxone and vanc and switched to IV doxy on 2/20 for 4 more days --continue IV lasix 40 mg for edema  2.  Acute kidney injury, POA Presented with a serum creatinine of 1.99. Baseline ~0.7.  Unclear etiology since Cr didn't improve significantly after MIVF.  Currently 1.5-1.6.  MIVF d/c'ed on 2/16.   --Trial IV Lasix 40 mg x1 on 2/19, as pt appears to have some swelling and may have some vascular congestion. --US renal showed no hydro, but mildly echogenic kidneys PLAN:  --continue IV lasix 40 mg daily today (Cr remained stable so far with diuresis) --No IVF  3.  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 --NPO but can have ice chip  PEG tube malfunction --PEG tube noted to be split and leaking --IR to replace on Monday  Chronic atrial fibrillation --on full tx dose of Lovenox --consider transition to [place]  4.  Essential hypertension --PRN IV hydralazine since no enteral access  5. Recent large right thigh hematoma Monitor  # Chronic pain and chronic anxiety --Pt is on a lot of opioids and benzos PTA.   --Pt needs to wake up to request pain meds. --IV ativan and IV morphine for now due to no enteral access.   Diet: N.p.o., on tube feedings but can have ice chips DVT Prophylaxis: Therapeutic Anticoagulation with Lovenox   Advance goals of care discussion: DNR per prior discussion with palliative care and the nephew.  Family Communication: not today  Disposition:  Pt is from SNF, admitted with cellulitis, still needs IV antibiotics, cellulitis starting to improve.  Will also need PEG tube exchanged on Monday.  Will discharge back to SNF after.   Subjective:  Pt indicated that his left arm was a little better.  Less anxious today, and was asking about the dosage of his PRN IV morphine.  Had good amount of urine output with IV lasix.  No fever, N/V/D, dysuria.   Physical Exam: Constitutional: NAD, AAOx3, complete expressive aphasia, but comprehends.  Found sleeping but easily  awoken.   HEENT: conjunctivae and lids normal, EOMI, mucosa dry CV: RRR no M,R,G. Distal pulses +2.  No cyanosis.   RESP: Diffuse rhonchi, gurgled breath sounds, normal respiratory effort, on RA GI: +BS, NTND Extremities: Swelling and warmth in entire left arm improved SKIN: warm, dry and intact    Vitals:   08/05/19 0903 08/05/19 2323 08/06/19 0359 08/06/19 0734  BP: (!) 169/88 (!)  180/99 (!) 166/82 (!) 176/58  Pulse: 79 95 89 61  Resp: 18 16  18   Temp: 97.9 F (36.6 C) 97.9 F (36.6 C)  97.7 F (36.5 C)  TempSrc: Oral Oral  Oral  SpO2: 97% 97%    Weight:      Height:        Intake/Output Summary (Last 24 hours) at 08/06/2019 1342 Last data filed at 08/06/2019 0725 Gross per 24 hour  Intake 405.99 ml  Output 4600 ml  Net -4194.01 ml   Filed Weights   07/30/19 0838 07/30/19 1747  Weight: (!) 149.7 kg (!) 159.5 kg    Data Reviewed: I have personally reviewed and interpreted daily labs, tele strips, imagings as discussed above. I reviewed all nursing notes, pharmacy notes, vitals, pertinent old records I have discussed plan of care as described above with RN and patient/family.  CBC: Recent Labs  Lab 08/02/19 0608 08/03/19 0444 08/04/19 0455 08/05/19 0913 08/06/19 0543  WBC 7.1 8.4 8.3 6.7 6.2  HGB 7.3* 7.7* 7.8* 7.9* 8.5*  HCT 23.3* 24.2* 24.9* 25.3* 26.2*  MCV 93.2 93.1 93.6 93.7 90.3  PLT 222 250 260 288 299   Basic Metabolic Panel: Recent Labs  Lab 08/02/19 0608 08/03/19 0444 08/04/19 0455 08/05/19 0913 08/06/19 0543  NA 140 138 139 139 139  K 3.7 3.7 3.9 3.6 3.1*  CL 105 104 103 102 100  CO2 28 28 28 27 28   GLUCOSE 106* 120* 103* 94 99  BUN 29* 29* 33* 33* 34*  CREATININE 1.66* 1.67* 1.55* 1.55* 1.53*  CALCIUM 8.6* 8.6* 8.7* 9.0 9.4  MG 1.8 2.1 2.2 2.0 1.9    Studies: No results found.  Scheduled Meds: . baclofen  5 mg Per Tube BID  . chlorhexidine  15 mL Mouth Rinse BID  . Chlorhexidine Gluconate Cloth  6 each Topical Daily  . feeding supplement (PRO-STAT SUGAR FREE 64)  30 mL Per Tube BID  . free water  200 mL Per Tube Q6H  . gabapentin  600 mg Per Tube QID  . glycopyrrolate  1 mg Oral BID  . LORazepam  0.5 mg Intravenous TID  . mouth rinse  15 mL Mouth Rinse q12n4p  . metoprolol tartrate  12.5 mg Per Tube BID  . nystatin  1 mL Oral TID  . pantoprazole sodium  40 mg Per Tube Daily  . sertraline  20 mg Per Tube  Daily  . simethicone  160 mg Per Tube QID   Continuous Infusions: . doxycycline (VIBRAMYCIN) IV 100 mg (08/06/19 0549)  . feeding supplement (JEVITY 1.5 CAL/FIBER) 1,000 mL (08/01/19 0921)  . potassium chloride 10 mEq (08/06/19 1258)   PRN Meds: acetaminophen, albuterol, ALPRAZolam, cyclobenzaprine, hydrALAZINE, morphine injection, ondansetron (ZOFRAN) IV, oxyCODONE   Enzo Bi, MD  08/06/2019 1:42 PM  To reach On-call, see care teams to locate the attending and reach out to them via [email or web address]. If 7PM-7AM, please contact night-coverage If you still have difficulty reaching the attending provider, please page the [facility] (Director on Call) for [facility] on amion for assistance.

## 2019-08-07 ENCOUNTER — Inpatient Hospital Stay: Payer: Medicare PPO

## 2019-08-07 HISTORY — PX: IR GASTROSTOMY TUBE REMOVAL: IMG5492

## 2019-08-07 LAB — BASIC METABOLIC PANEL
Anion gap: 11 (ref 5–15)
BUN: 35 mg/dL — ABNORMAL HIGH (ref 8–23)
CO2: 27 mmol/L (ref 22–32)
Calcium: 9 mg/dL (ref 8.9–10.3)
Chloride: 101 mmol/L (ref 98–111)
Creatinine, Ser: 1.58 mg/dL — ABNORMAL HIGH (ref 0.61–1.24)
GFR calc Af Amer: 54 mL/min — ABNORMAL LOW (ref >60)
GFR calc non Af Amer: 47 mL/min — ABNORMAL LOW (ref >60)
Glucose, Bld: 97 mg/dL (ref 70–99)
Potassium: 3.2 mmol/L — ABNORMAL LOW (ref 3.5–5.1)
Sodium: 139 mmol/L (ref 135–145)

## 2019-08-07 LAB — GLUCOSE, CAPILLARY
Glucose-Capillary: 86 mg/dL (ref 70–99)
Glucose-Capillary: 85 mg/dL (ref 70–99)
Glucose-Capillary: 105 mg/dL — ABNORMAL HIGH (ref 70–99)

## 2019-08-07 LAB — CBC
HCT: 24.3 % — ABNORMAL LOW (ref 39.0–52.0)
Hemoglobin: 8.1 g/dL — ABNORMAL LOW (ref 13.0–17.0)
MCH: 30 pg (ref 26.0–34.0)
MCHC: 33.3 g/dL (ref 30.0–36.0)
MCV: 90 fL (ref 80.0–100.0)
Platelets: 289 10*3/uL (ref 150–400)
RBC: 2.7 MIL/uL — ABNORMAL LOW (ref 4.22–5.81)
RDW: 14.6 % (ref 11.5–15.5)
WBC: 6.1 10*3/uL (ref 4.0–10.5)
nRBC: 0 % (ref 0.0–0.2)

## 2019-08-07 LAB — MAGNESIUM: Magnesium: 1.8 mg/dL (ref 1.7–2.4)

## 2019-08-07 MED ORDER — LIDOCAINE VISCOUS HCL 2 % MT SOLN
OROMUCOSAL | Status: AC
  Filled 2019-08-07: qty 15

## 2019-08-07 MED ORDER — FUROSEMIDE 10 MG/ML IJ SOLN
40.0000 mg | Freq: Once | INTRAMUSCULAR | Status: AC
  Administered 2019-08-07: 40 mg via INTRAVENOUS
  Filled 2019-08-07: qty 4

## 2019-08-07 MED ORDER — APIXABAN 5 MG PO TABS
5.0000 mg | ORAL_TABLET | Freq: Two times a day (BID) | ORAL | Status: DC
  Administered 2019-08-07 – 2019-08-08 (×3): 5 mg via ORAL
  Filled 2019-08-07 (×3): qty 1

## 2019-08-07 MED ORDER — MORPHINE SULFATE (PF) 2 MG/ML IV SOLN
1.0000 mg | Freq: Once | INTRAVENOUS | Status: AC
  Administered 2019-08-07: 1 mg via INTRAVENOUS
  Filled 2019-08-07: qty 1

## 2019-08-07 MED ORDER — ALPRAZOLAM 0.5 MG PO TABS
0.5000 mg | ORAL_TABLET | Freq: Three times a day (TID) | ORAL | Status: DC
  Administered 2019-08-07 – 2019-08-08 (×3): 0.5 mg
  Filled 2019-08-07 (×3): qty 1

## 2019-08-07 MED ORDER — ALTEPLASE 2 MG IJ SOLR
2.0000 mg | Freq: Once | INTRAMUSCULAR | Status: AC
  Administered 2019-08-07: 07:00:00 2 mg
  Filled 2019-08-07: qty 2

## 2019-08-07 NOTE — TOC Progression Note (Signed)
Transition of Care [facility]) - Progression Note    Patient Details  Name: Kyle [name] MRN: [id number] Date of Birth: 1956-01-28  Transition of Care [facility]) CM/SW [place], RN Phone Number: 08/07/2019, 12:09 PM  Clinical Narrative:   Was notified by the physician that the patient wants to go home with his cousin Kyle [name] to care for him, He is a long term resident at [facility].  I attempted to call the cousin at [phone] I was unable to reach her and her Voice mail is full.  I went into the room and notified the patient that I was unable to reach his cousin and that I would need to hear back from her before I could discharge him in her care.  He will text the cousin and ask her to call me I provided him with my contact information         Expected Discharge Plan and Services                                                 Social Determinants of Health (SDOH) Interventions    Readmission Risk Interventions No flowsheet data found.

## 2019-08-07 NOTE — Care Management Important Message (Signed)
Important Message  Patient Details  Name: Kyle [name] MRN: [id number] Date of Birth: 1956/03/05   Medicare Important Message Given:  Yes     Dannette Barbara 08/07/2019, 2:03 PM

## 2019-08-07 NOTE — Progress Notes (Addendum)
Pt with no DVT prophylaxis/anticoagulation. Secure message sent to Rufina Falco, NP, made aware, Will defer to next shift and attending MD.    ------------------------------------------  Pt is getting PEG tube exchange with IR, and per their protocol, no blood thinner, even including ppx dose.  Enzo Bi, MD

## 2019-08-07 NOTE — Progress Notes (Signed)
Pt with PICC line placed prior to admission, blood return noted on picc line, however it is with minimal return. Dressing changed utilizing sterile technique, no antimicrobial dressing was noted to previous dressing that was removed. New antimicrobial disc and occlusive dressing placed with STAT lock holding device. Will consult IV team for possible cathflo ?

## 2019-08-07 NOTE — TOC Progression Note (Signed)
Transition of Care [facility]) - Progression Note    Patient Details  Name: Kyle [name] MRN: [id number] Date of Birth: 02-10-1956  Transition of Care [facility]) CM/SW [place], RN Phone Number: 08/07/2019, 3:43 PM  Clinical Narrative:   Lenna Sciara, the patient's cousin that he planeed to discharge home with called back and I explained to her that he will need 24/7 care, she stated that she can't provide that, she works alternating days and nights, she said she supports him returning to [facility].  She will come to the hospital and talk to him to explain that to him tomorrow    Expected Discharge Plan: [place] Barriers to Discharge: Continued Medical Work up  Expected Discharge Plan and Services Expected Discharge Plan: [place]                                               Social Determinants of Health (SDOH) Interventions    Readmission Risk Interventions No flowsheet data found.

## 2019-08-07 NOTE — OR Nursing (Signed)
No sedation sending back to inpt room for procedure area

## 2019-08-07 NOTE — Progress Notes (Signed)
Nutrition Follow-up  DOCUMENTATION CODES:   Morbid obesity  INTERVENTION:  Once replacement J-tube okay to use per radiology recommend resuming Jevity 1.5 Cal at 70 ml/hr + Pro-Stat 30 mL BID per tube. Provides 2720 kcal, 137 grams of protein, 1277 mL H2O daily.  With free water flush of 200 mL Q6hrs patient will receive a total of 2077 mL H2O including water in tube feeding.  Goal TF regimen meets 100% RDIs for vitamins/minerals.  NUTRITION DIAGNOSIS:   Inadequate oral intake related to inability to eat as evidenced by NPO status.  Ongoing - addressing with TF regimen.  GOAL:   Patient will meet greater than or equal to 90% of their needs  Met with TF regimen.  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  Patient was not in his room at time of RD assessment today. He had been taken down for exchange of his J-tube by IR under fluoroscopy as his previous tube was cracked and leaking.   Medications reviewed and include: Xanax, Eliquis, free water flush 200 mL Q6hrs, gabapentin, glycopyrrolate, Protonix, sertraline, simethicone, doxycycling.  Labs reviewed: CBG 85-90, Potassium 3.2, BUN 35, Creatinine 1.58.  Diet Order:   Diet Order            Diet NPO time specified Except for: Ice Chips  Diet effective now             EDUCATION NEEDS:   No education needs have been identified at this time  Skin:  Skin Assessment: Reviewed RN Assessment  Last BM:  08/07/2019 medium type 4  Height:   Ht Readings from Last 1 Encounters:  07/30/19 6' 5"  (1.956 m)   Weight:   Wt Readings from Last  1 Encounters:  07/30/19 (!) 159.5 kg   Ideal Body Weight:  94.5 kg  BMI:  Body mass index is 41.7 kg/m.  Estimated Nutritional Needs:   Kcal:  2500-2700  Protein:  125-135 grams  Fluid:  >/= 2 L/day  Jacklynn Barnacle, MS, RD, LDN Pager number available on Amion

## 2019-08-07 NOTE — Progress Notes (Signed)
[facility] Progress Note  Patient: Kyle [name]    MWU:[id number]  DOA: 07/30/2019     Date of Service: the patient was seen and examined on 08/07/2019  Chief Complaint  Patient presents with  . Arm Pain   Brief hospital course: Initially presented on 04/30/2019 for acute CVA.  From there patient was discharged to rehab.  Patient signed out AMA from the rehab.  Had an aspiration event at home and was readmitted on 06/08/2019.  Discharged on 06/23/2019 to SNF. Now presenting with complaints of left arm swelling and pain.  Currently being admitted and treated for cellulitis. Currently further plan is continue IV antibiotics and follow-up on cultures.  Assessment and Plan: 1.  Left arm pain and swelling  2/2 Cellulitis AND edema, very slowly improving Presents with uncontrolled pain ongoing for almost a week. EDP discussed with orthopedics as well as vascular surgery. CTA upper extremity limited to radial and ulnar arteries on the left only.  Remainder of the arterial structures including left subclavian of the left upper extremity within normal limits.  Edema is seen but no focal hematoma noted. Left upper extremity Doppler is also negative for any DVT. CRP mildly elevated.  ESR 128 though. Patient currently being treated with IV antibiotics  Discussed with hand surgery at [facility] currently recommending conservative measures appreciate their assistance. PLAN: --Received 6 days of IV abx with ceftriaxone and vanc and switched to IV doxy on 2/20 for 4 more days --continue IV lasix 40 mg for edema  2.  Acute kidney injury, POA Presented with a serum creatinine of 1.99. Baseline ~0.7.  Unclear etiology since Cr didn't improve significantly after MIVF.  Currently 1.5-1.6.  MIVF d/c'ed on 2/16.   --Trial IV Lasix 40 mg x1 on 2/19, as pt appears to have some swelling and may have some vascular congestion. --US renal showed no hydro, but mildly echogenic  kidneys PLAN: --continue IV lasix 40 mg today (Cr remained stable so far with diuresis) --No IVF  3.  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 --NPO but can have ice chip  PEG tube malfunction --PEG tube noted to be split and leaking --IR to replace today  Chronic atrial fibrillation --was on full tx dose of Lovenox --Transition to Eliquis today.  4.  Essential hypertension --PRN IV hydralazine  --continue home metop --currently receiving IV lasix 40  5. Recent large right thigh hematoma Monitor  # Chronic pain and chronic anxiety --Pt is on a lot of opioids and benzos PTA.   --Pt needs to wake up to request pain meds. --Resume home opioids and benzo regimen after PEG tube exchange   Diet: N.p.o., on tube feedings but can have ice chips DVT Prophylaxis: Therapeutic Anticoagulation with Eliquis   Advance goals of care discussion: DNR per prior discussion with palliative care and the nephew.  Family Communication: not today  Disposition:  Pt is from SNF, admitted with cellulitis, needed IV antibiotics, cellulitis starting to improve.  PEG tube exchanged on Monday.  Ready for discharge tomorrow, however, now pt wants to go home with his cousin as caregiver, Tarri Abernethy [phone].  TOC reached Melissa who said she can't provide 24/7 care to pt.  Pt will discharge back to SNF tomorrow.  Subjective:  Pt complained of his condom cath leaking.  Had PEG tube exchange today.  Put out good amount of urine with IV lasix.  Left arm  swelling and pain improved.  No fever, N/V/D.   Physical Exam: Constitutional: NAD, AAOx3, complete expressive aphasia, but comprehends.   HEENT: conjunctivae and lids normal, EOMI, mucosa dry CV: RRR no M,R,G. Distal pulses +2.  No cyanosis.   RESP: Diffuse rhonchi, gurgled breath sounds, normal respiratory effort, on RA GI: +BS, NTND Extremities: Swelling and warmth in  entire left arm improved SKIN: warm, dry and intact    Vitals:   08/06/19 1718 08/06/19 2324 08/07/19 0813 08/07/19 1259  BP: (!) 151/96 (!) 164/88 (!) 159/82 (!) 165/73  Pulse: 85 87 (!) 52   Resp: _0 Temp: 97.8 F (36.6 C) 97.6 F (36.4 C)    TempSrc: Oral Oral    SpO2: 98% 97% 98% 98%  Weight:      Height:        Intake/Output Summary (Last 24 hours) at 08/07/2019 1657 Last data filed at 08/07/2019 1649 Gross per 24 hour  Intake 0 ml  Output 4450 ml  Net -4450 ml   Filed Weights   07/30/19 0838 07/30/19 1747  Weight: (!) 149.7 kg (!) 159.5 kg    Data Reviewed: I have personally reviewed and interpreted daily labs, tele strips, imagings as discussed above. I reviewed all nursing notes, pharmacy notes, vitals, pertinent old records I have discussed plan of care as described above with RN and patient/family.  CBC: Recent Labs  Lab 08/03/19 0444 08/04/19 0455 08/05/19 0913 08/06/19 0543 08/07/19 0430  WBC 8.4 8.3 6.7 6.2 6.1  HGB 7.7* 7.8* 7.9* 8.5* 8.1*  HCT 24.2* 24.9* 25.3* 26.2* 24.3*  MCV 93.1 93.6 93.7 90.3 90.0  PLT 250 260 288 321 478   Basic Metabolic Panel: Recent Labs  Lab 08/03/19 0444 08/04/19 0455 08/05/19 0913 08/06/19 0543 08/07/19 0430  NA 138 139 139 139 139  K 3.7 3.9 3.6 3.1* 3.2*  CL 104 103 102 100 101  CO2 _1 GLUCOSE 120* 103* 94 99 97  BUN 29* 33* 33* 34* 35*  CREATININE 1.67* 1.55* 1.55* 1.53* 1.58*  CALCIUM 8.6* 8.7* 9.0 9.4 9.0  MG 2.1 2.2 2.0 1.9 1.8    Studies: IR GASTROSTOMY TUBE REMOVAL/REPAIR  Result Date: 08/07/2019 INDICATION: Broken jejunostomy catheter. Previous stroke, needs enteral feeding support EXAM: EXCHANGE OF JEJUNOSTOMY CATHETER UNDER FLUOROSCOPY MEDICATIONS: viscous lidocaine topical ANESTHESIA/SEDATION: Viscous lidocaine topical CONTRAST:  18 mL iodinated contrast into the small bowel lumen PROCEDURE: Informed written consent was obtained from the patient after a thorough  discussion of the procedural risks, benefits and alternatives. All questions were addressed. Maximal Sterile Barrier Technique was utilized including caps, mask, sterile gowns, sterile gloves, sterile drape, hand hygiene and skin antiseptic. A timeout was performed prior to the initiation of the procedure. Viscous lidocaine was placed around the jejunostomy catheter entry site. Small contrast injection confirmed intraluminal positioning of the tip. The balloon was deflated and the catheter was exchanged over short Amplatz wire for a new 16 French balloon retention device. The retention balloon was inflated with 7 mL sterile saline. Contrast injection confirms appropriate positioning. The external bumper was applied. The patient tolerated the procedure well. FLUOROSCOPY TIME:  0.27 minute; 8 mGy COMPLICATIONS: None immediate. IMPRESSION: 1. Technically successful 16 French single-lumen jejunostomy catheter exchange under fluoroscopy Electronically Signed   By: Lucrezia Europe M.D.   On: 08/07/2019 14:27    Scheduled Meds: . ALPRAZolam  0.5 mg Per Tube TID  . apixaban  5 mg Oral BID  .  baclofen  5 mg Per Tube BID  . chlorhexidine  15 mL Mouth Rinse BID  . Chlorhexidine Gluconate Cloth  6 each Topical Daily  . feeding supplement (PRO-STAT SUGAR FREE 64)  30 mL Per Tube BID  . free water  200 mL Per Tube Q6H  . gabapentin  600 mg Per Tube QID  . glycopyrrolate  1 mg Oral BID  . lidocaine      . mouth rinse  15 mL Mouth Rinse q12n4p  . metoprolol tartrate  12.5 mg Per Tube BID  . nystatin  1 mL Oral TID  . pantoprazole sodium  40 mg Per Tube Daily  . sertraline  20 mg Per Tube Daily  . simethicone  160 mg Per Tube QID   Continuous Infusions: . doxycycline (VIBRAMYCIN) IV 100 mg (08/07/19 1610)  . feeding supplement (JEVITY 1.5 CAL/FIBER) 1,000 mL (08/07/19 1416)   PRN Meds: acetaminophen, albuterol, ALPRAZolam, cyclobenzaprine, hydrALAZINE, ondansetron (ZOFRAN) IV, oxyCODONE   Enzo Bi,  MD  08/07/2019 4:57 PM  To reach On-call, see care teams to locate the attending and reach out to them via [email or web address]. If 7PM-7AM, please contact night-coverage If you still have difficulty reaching the attending provider, please page the [facility] (Director on Call) for [facility] on amion for assistance.

## 2019-08-08 LAB — GLUCOSE, CAPILLARY: Glucose-Capillary: 100 mg/dL — ABNORMAL HIGH (ref 70–99)

## 2019-08-08 LAB — BASIC METABOLIC PANEL
Anion gap: 9 (ref 5–15)
BUN: 39 mg/dL — ABNORMAL HIGH (ref 8–23)
CO2: 32 mmol/L (ref 22–32)
Calcium: 8.9 mg/dL (ref 8.9–10.3)
Chloride: 99 mmol/L (ref 98–111)
Creatinine, Ser: 1.49 mg/dL — ABNORMAL HIGH (ref 0.61–1.24)
GFR calc Af Amer: 58 mL/min — ABNORMAL LOW (ref >60)
GFR calc non Af Amer: 50 mL/min — ABNORMAL LOW (ref >60)
Glucose, Bld: 122 mg/dL — ABNORMAL HIGH (ref 70–99)
Potassium: 2.8 mmol/L — ABNORMAL LOW (ref 3.5–5.1)
Sodium: 140 mmol/L (ref 135–145)

## 2019-08-08 LAB — CBC
HCT: 26.1 % — ABNORMAL LOW (ref 39.0–52.0)
Hemoglobin: 8.3 g/dL — ABNORMAL LOW (ref 13.0–17.0)
MCH: 29.2 pg (ref 26.0–34.0)
MCHC: 31.8 g/dL (ref 30.0–36.0)
MCV: 91.9 fL (ref 80.0–100.0)
Platelets: 325 10*3/uL (ref 150–400)
RBC: 2.84 MIL/uL — ABNORMAL LOW (ref 4.22–5.81)
RDW: 14.7 % (ref 11.5–15.5)
WBC: 6.4 10*3/uL (ref 4.0–10.5)
nRBC: 0 % (ref 0.0–0.2)

## 2019-08-08 LAB — MAGNESIUM: Magnesium: 1.9 mg/dL (ref 1.7–2.4)

## 2019-08-08 LAB — RESPIRATORY PANEL BY RT PCR (FLU A&B, COVID)
Influenza A by PCR: NEGATIVE
Influenza B by PCR: NEGATIVE
SARS Coronavirus 2 by RT PCR: NEGATIVE

## 2019-08-08 MED ORDER — SODIUM CHLORIDE 0.9 % IV SOLN
100.0000 mg | Freq: Two times a day (BID) | INTRAVENOUS | Status: DC
  Administered 2019-08-08: 100 mg via INTRAVENOUS
  Filled 2019-08-08 (×2): qty 100

## 2019-08-08 MED ORDER — FUROSEMIDE 10 MG/ML IJ SOLN
40.0000 mg | Freq: Once | INTRAMUSCULAR | Status: AC
  Administered 2019-08-08: 40 mg via INTRAVENOUS
  Filled 2019-08-08: qty 4

## 2019-08-08 MED ORDER — SERTRALINE HCL 20 MG/ML PO CONC: 20.0000 mg | Freq: Every day | ORAL | Status: DC

## 2019-08-08 MED ORDER — OXYCODONE HCL 5 MG/5ML PO SOLN: 7.5000 mg | Freq: Four times a day (QID) | ORAL | 0 refills | Status: DC | PRN

## 2019-08-08 MED ORDER — POTASSIUM CHLORIDE 20 MEQ PO PACK
40.0000 meq | PACK | ORAL | Status: AC
  Administered 2019-08-08 (×2): 40 meq via ORAL
  Filled 2019-08-08 (×2): qty 2

## 2019-08-08 MED ORDER — APIXABAN 5 MG PO TABS: 5.0000 mg | ORAL_TABLET | Freq: Two times a day (BID) | ORAL | Status: DC

## 2019-08-08 NOTE — TOC Transition Note (Signed)
Transition of Care [facility]) - CM/SW Discharge Note   Patient Details  Name: Kyle [name] MRN: [id number] Date of Birth: 08/02/1955  [facility]) CM/SW Contact:  Su Hilt, RN Phone Number: 08/08/2019, 11:40 AM   Clinical Narrative:    The patient to DC to [facility] today where he resides long term, The Nurse has already called report and the PICC line to be discontinued prior to discharge, The Patient will transport via Ems CM to call for transport   Final next level of care: Skilled Nursing Facility Barriers to Discharge: Barriers Resolved   Patient Goals and CMS Choice Patient states their goals for this hospitalization and ongoing recovery are:: going to rehab      Discharge Placement              Patient chooses bed at: [facility] [place] Patient to be transferred to facility by: EMS Name of family member notified: Melissa Patient and family notified of of transfer: 08/08/19  Discharge Plan and Services                                     Social Determinants of Health (SDOH) Interventions     Readmission Risk Interventions Readmission Risk Prevention Plan 08/08/2019  Transportation Screening Complete  Medication Review (RN Care Manager) Referral to Pharmacy  PCP or Specialist appointment within 3-5 days of discharge Complete  Skilled Nursing Facility Complete  Some recent data might be hidden

## 2019-08-08 NOTE — Discharge Summary (Addendum)
Physician Discharge Summary   Kyle [name]  male DOB: [birth year]  [id number]  PCP: [name], MD  Admit date: 07/30/2019 Discharge date:   Admitted From: SNF Disposition:  SNF.   CODE STATUS: DNR  Hospital Course:  For full details, please see H&P, progress notes, consult notes and ancillary notes.  Briefly,  Kyle [name] is a 64 y.o. Caucasian male with medical history significant of hypertension, stroke with aphasia and dysphagia, feeding tube placement, GERD, depression, anxiety, atrial fibrillation on Lovenox, former smoker, emphysema, who presented from SNF with left arm swelling and pain.  Initially pt presented on 04/30/2019 for acute CVA.  From there patient was discharged to SNF.  Patient signed out AMA from the SNF.  Had an aspiration event at home and was readmitted on 06/08/2019.  Discharged on 06/23/2019 to SNF.  # Left arm pain and swelling  2/2 Cellulitis AND edema, very slowly improving Presents with uncontrolled pain ongoing for almost a week.  Discussed with orthopedics, hand surgery at [facility], well as vascular surgery who recommended medical management. CTA upper extremity limited to radial and ulnar arteries on the left only, however, remainder of the arterial structures including left subclavian of the left upper extremity within normal limits.  Edema was seen but no focal hematoma noted. Left upper extremity Doppler was also negative for any DVT.    Pt received 6 days of IV abx with ceftriaxone and vanc with improvement in the erythema, edema and warmth of the left arm, so was switched to IV doxy on 2/20.  Pt completed 10 days of IV abx for tx of his cellulitis.  Pt also received IV lasix 40 mg for several days which also improved his left arm swelling.    # Acute kidney injury, POA Presented with a serum creatinine of 1.99. Baseline ~0.7.  Unclear etiology since Cr didn't improve significantly after MIVF, so MIVF d/c'ed on 2/16.  US renal showed  no hydro, but mildly echogenic kidneys.  Pt appeared to have some swelling, and CXR showed pulm edema, so pt may have vascular congestion.  Pt received Lasix 40 mg x1 on 2/19 and continued for 5 days, and had good urine output and stable (even improved Cr), and swelling improved.  Pt was not on diuretic prior to presentation, and has no documented CHF, so will leave the decision to start scheduled diuretic to outpatient provider.  Cr 1.49 on the day of discharge, which may be pt's new baseline.  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 NPO but can have ice chip.  PEG tube malfunction PEG tube noted to be split and leaking during this hospitalization.  IR replaced it on 2/22.  Tube feeding resumed with no issue.  Chronic atrial fibrillation on anticoagulation. Pt was on full tx dose of Lovenox prior to presentation, and transitioned to Eliquis during this hospitalization.  Considering pt had am embolic stroke, he needs to be on anticoagulation for stroke ppx.  # Essential hypertension Continued home metop   # Recent large right thigh hematoma Stable.  # Chronic pain and chronic anxiety Pt was on a lot of opioids and benzos PTA.  Pt needs to wake up to request pain meds.  Pt continued on his scheduled Xanax.  # PICC line Pt presented with a PICC line in his right arm.  Per facility, it's about 42 weeks old.  PICC line  does not appear infected, however it is an infection risk, so PICC line was removed prior to discharge, after confirming and receiving permission from facility staff to remove it.     Discharge Diagnoses:  Principal Problem:   Left arm pain Active Problems:   GERD (gastroesophageal reflux disease)   Adjustment disorder with mixed anxiety and depressed mood   Essential hypertension   Chronic atrial fibrillation (HCC)   Stroke (HCC) R MCA emb d/t AF not on AC s/p partial revascularization  w/ IR   Dysphagia due to recent cerebrovascular accident   AKI (acute kidney injury) ([place])   Normocytic anemia   Cellulitis    Discharge Instructions:  Allergies as of 08/08/2019      Reactions   Vioxx [rofecoxib] Rash      Medication List    STOP taking these medications   cephALEXin 500 MG capsule Commonly known as: KEFLEX   enoxaparin 300 MG/3ML Soln injection Commonly known as: LOVENOX   glycopyrrolate 1 MG tablet Commonly known as: ROBINUL     TAKE these medications   albuterol 108 (90 Base) MCG/ACT inhaler Commonly known as: VENTOLIN HFA Inhale 2 puffs into the lungs every 4 (four) hours as needed for wheezing or shortness of breath.   ALPRAZolam 0.5 MG tablet Commonly known as: XANAX Place 1 tablet (0.5 mg total) into feeding tube 3 (three) times daily.   ALPRAZolam 1 MG tablet Commonly known as: XANAX Take 1 tablet (1 mg total) by mouth 2 (two) times daily as needed for anxiety (in addition to scheduled doses for panic attacks and worse symptoms).   apixaban 5 MG Tabs tablet Commonly known as: ELIQUIS Take 1 tablet (5 mg total) by mouth 2 (two) times daily.   cyclobenzaprine 10 MG tablet Commonly known as: FLEXERIL Place 1 tablet (10 mg total) into feeding tube 3 (three) times daily as needed for muscle spasms.   diphenhydrAMINE 25 mg capsule Commonly known as: BENADRYL Take 25 mg by mouth every 4 (four) hours as needed for itching or allergies.   feeding supplement (JEVITY 1.2 CAL) Liqd Place 1,000 mLs into feeding tube continuous.   feeding supplement (PRO-STAT SUGAR FREE 64) Liqd Place 30 mLs into feeding tube 3 (three) times daily.   free water Soln Place 200 mLs into feeding tube every 6 (six) hours.   gabapentin 250 MG/5ML solution Commonly known as: NEURONTIN Place 12 mLs (600 mg total) into feeding tube 4 (four) times daily.   metoprolol tartrate 25 mg/10 mL Susp Commonly known as: LOPRESSOR Place 5 mLs (12.5 mg total) into feeding  tube 2 (two) times daily.   nystatin 100000 UNIT/ML suspension Commonly known as: MYCOSTATIN Take 1 mL by mouth 3 (three) times daily. (swab to tongue, cheeks and top of mouth)   oxyCODONE 5 MG/5ML solution Commonly known as: ROXICODONE Place 7.5 mLs (7.5 mg total) into feeding tube every 6 (six) hours as needed for moderate pain or severe pain. What changed:   how much to take  when to take this  reasons to take this   pantoprazole sodium 40 mg/20 mL Pack Commonly known as: PROTONIX Place 20 mLs (40 mg total) into feeding tube daily.   sertraline 20 MG/ML concentrated solution Commonly known as: ZOLOFT Place 1 mL (20 mg total) into feeding tube daily.   simethicone 80 MG chewable tablet Commonly known as: MYLICON Place 2 tablets (160 mg total) into feeding tube 4 (four) times daily for 4 days.  Follow-up Information    [name], MD. Schedule an appointment as soon as possible for a visit in 1 week(s).   Specialty: Internal Medicine Contact information: [address] [place] [address] [phone]           Allergies  Allergen Reactions  . Vioxx [Rofecoxib] Rash     The results of significant diagnostics from this hospitalization (including imaging, microbiology, ancillary and laboratory) are listed below for reference.   Consultations:   Procedures/Studies: DG Chest 1 View  Result Date: 08/05/2019 CLINICAL DATA:  Shortness of breath EXAM: CHEST  1 VIEW COMPARISON:  06/11/2019 FINDINGS: Cardiomegaly. Right arm PICC tip in the SVC above the right atrium. Pulmonary venous hypertension with early interstitial edema. Aortic atherosclerosis. No consolidation, collapse or effusion. IMPRESSION: Cardiomegaly.  Interstitial pulmonary edema. Electronically Signed   By: Nelson Chimes M.D.   On: 08/05/2019 02:17   CT ANGIO UP EXTREM LEFT W &/OR WO CONTAST  Result Date: 07/30/2019 CLINICAL DATA:  Left arm pain EXAM: CT ANGIOGRAPHY OF THE LEFT  UPPEREXTREMITY TECHNIQUE: Multidetector CT imaging of the left upper extremitywas performed using the standard protocol during bolus administration of intravenous contrast. Multiplanar CT image reconstructions and MIPs were obtained to evaluate the vascular anatomy. CONTRAST:  114mL OMNIPAQUE IOHEXOL 350 MG/ML SOLN COMPARISON:  Venous ultrasound obtained on the same day. FINDINGS: Vascular: Thoracic aorta demonstrates atherosclerotic calcifications without aneurysmal dilatation or dissection. No cardiac enlargement is seen. No abdominal aneurysmal dilatation is noted. The origins of the brachiocephalic vessels are patent with mild atherosclerotic calcifications. The visualized portions of the carotid arteries and right subclavian artery are within normal limits. The left subclavian artery demonstrates atherosclerotic calcification although no focal stenosis is identified. The axillary and brachial arteries are within normal limits. The brachial bifurcation is within normal limits although the timing of the contrast bolus limits evaluation of the radial and ulnar arteries. Comparison with the recent ultrasound examination shows these to be patent. Correlation with distal pulses is recommended. Nonvascular: The soft tissues of the neck appear within normal limits. Thoracic inlet is unremarkable. No sizable hilar or mediastinal adenopathy is noted. The esophagus is within normal limits. Lungs are well aerated bilaterally without focal infiltrate or sizable effusion. No definitive parenchymal nodules are seen. Left upper extremity demonstrates mild edema although no sizable fluid collection or focal hematoma is identified. Visualized abdominal structures show no acute abnormality. A jejunostomy catheter is not seen multiple rounded soft tissue densities are noted in the subcutaneous fat of the anterior abdominal wall likely related to injection granulomas. Some calcified injection granulomas are noted in the left  buttock. Postsurgical changes are noted within the stomach. Degenerative changes of the thoracolumbar spine are seen. No acute bony abnormality is noted. Review of the MIP images confirms the above findings. IMPRESSION: Vascular: Timing of the contrast bolus limits evaluation of the radial and ulnar arteries on the left. Comparison with recent ultrasound shows these to be patent. The remainder of the arterial structures of the left upper extremity appear within normal limits. Correlation with distal pulses is recommended. Nonvascular: Edema is seen within the left arm laterally and anteriorly although no focal hematoma is noted. No acute abnormality is seen. Electronically Signed   By: Inez Catalina M.D.   On: 07/30/2019 11:00   US RENAL  Result Date: 08/05/2019 CLINICAL DATA:  Inpatient.  Acute kidney injury. EXAM: RENAL / URINARY TRACT ULTRASOUND COMPLETE COMPARISON:  None. FINDINGS: Right Kidney: Renal measurements: 13.3 x 6.0 x  5.8 cm = volume: 241 mL. No hydronephrosis. Mildly echogenic renal parenchyma, normal thickness. No renal mass. Left Kidney: Renal measurements: 13.3 x 7.4 x 6.3 cm = volume: 327 mL. No hydronephrosis. Mildly echogenic renal parenchyma, normal thickness. No renal mass. Bladder: Appears normal for degree of bladder distention. Other: None. IMPRESSION: 1. No hydronephrosis. 2. Mildly echogenic normal size kidneys, compatible with reported history of nonspecific acute renal parenchymal disease. 3. Normal bladder. Electronically Signed   By: Ilona Sorrel M.D.   On: 08/05/2019 08:14   US Venous Img Upper Uni Left  Result Date: 07/30/2019 CLINICAL DATA:  Left arm pain and swelling EXAM: LEFT UPPER EXTREMITY VENOUS DOPPLER ULTRASOUND TECHNIQUE: Gray-scale sonography with graded compression, as well as color Doppler and duplex ultrasound were performed to evaluate the upper extremity deep venous system from the level of the subclavian vein and including the jugular, axillary, basilic,  radial, ulnar and upper cephalic vein. Spectral Doppler was utilized to evaluate flow at rest and with distal augmentation maneuvers. COMPARISON:  None. FINDINGS: Contralateral Subclavian Vein: Respiratory phasicity is normal and symmetric with the symptomatic side. No evidence of thrombus. Normal compressibility. Internal Jugular Vein: No evidence of thrombus. Normal compressibility, respiratory phasicity and response to augmentation. Subclavian Vein: No evidence of thrombus. Normal compressibility, respiratory phasicity and response to augmentation. Axillary Vein: No evidence of thrombus. Normal compressibility, respiratory phasicity and response to augmentation. Cephalic Vein: No evidence of thrombus. Normal compressibility, respiratory phasicity and response to augmentation. Basilic Vein: No evidence of thrombus. Normal compressibility, respiratory phasicity and response to augmentation. Brachial Veins: No evidence of thrombus. Normal compressibility, respiratory phasicity and response to augmentation. Radial Veins: No evidence of thrombus. Normal compressibility, respiratory phasicity and response to augmentation. Ulnar Veins: No evidence of thrombus. Normal compressibility, respiratory phasicity and response to augmentation. Venous Reflux:  None visualized. Other Findings:  Mild subcutaneous edema is seen. IMPRESSION: No evidence of DVT within the left upper extremity. Electronically Signed   By: Inez Catalina M.D.   On: 07/30/2019 11:01   IR GASTROSTOMY TUBE REMOVAL/REPAIR  Result Date: 08/07/2019 INDICATION: Broken jejunostomy catheter. Previous stroke, needs enteral feeding support EXAM: EXCHANGE OF JEJUNOSTOMY CATHETER UNDER FLUOROSCOPY MEDICATIONS: viscous lidocaine topical ANESTHESIA/SEDATION: Viscous lidocaine topical CONTRAST:  18 mL iodinated contrast into the small bowel lumen PROCEDURE: Informed written consent was obtained from the patient after a thorough discussion of the procedural risks,  benefits and alternatives. All questions were addressed. Maximal Sterile Barrier Technique was utilized including caps, mask, sterile gowns, sterile gloves, sterile drape, hand hygiene and skin antiseptic. A timeout was performed prior to the initiation of the procedure. Viscous lidocaine was placed around the jejunostomy catheter entry site. Small contrast injection confirmed intraluminal positioning of the tip. The balloon was deflated and the catheter was exchanged over short Amplatz wire for a new 16 French balloon retention device. The retention balloon was inflated with 7 mL sterile saline. Contrast injection confirms appropriate positioning. The external bumper was applied. The patient tolerated the procedure well. FLUOROSCOPY TIME:  0.27 minute; 8 mGy COMPLICATIONS: None immediate. IMPRESSION: 1. Technically successful 16 French single-lumen jejunostomy catheter exchange under fluoroscopy Electronically Signed   By: Lucrezia Europe M.D.   On: 08/07/2019 14:27      Labs: BNP (last 3 results) Recent Labs    06/08/19 1855  BNP [id number]   Basic Metabolic Panel: Recent Labs  Lab 08/04/19 0455 08/05/19 0913 08/06/19 0543 08/07/19 0430 08/08/19 0602  NA 139 139 139 139 140  K 3.9  3.6 3.1* 3.2* 2.8*  CL 103 102 100 101 99  CO2 28 27 28 27  32  GLUCOSE 103* 94 99 97 122*  BUN 33* 33* 34* 35* 39*  CREATININE 1.55* 1.55* 1.53* 1.58* 1.49*  CALCIUM 8.7* 9.0 9.4 9.0 8.9  MG 2.2 2.0 1.9 1.8 1.9   Liver Function Tests: No results for input(s): AST, ALT, ALKPHOS, BILITOT, PROT, ALBUMIN in the last 168 hours. No results for input(s): LIPASE, AMYLASE in the last 168 hours. No results for input(s): AMMONIA in the last 168 hours. CBC: Recent Labs  Lab 08/04/19 0455 08/05/19 0913 08/06/19 0543 08/07/19 0430 08/08/19 0602  WBC 8.3 6.7 6.2 6.1 6.4  HGB 7.8* 7.9* 8.5* 8.1* 8.3*  HCT 24.9* 25.3* 26.2* 24.3* 26.1*  MCV 93.6 93.7 90.3 90.0 91.9  PLT 260 288 321 289 325   Cardiac Enzymes: No  results for input(s): CKTOTAL, CKMB, CKMBINDEX, TROPONINI in the last 168 hours. BNP: Invalid input(s): POCBNP CBG: Recent Labs  Lab 08/06/19 0430 08/06/19 2122 08/07/19 0811 08/07/19 1158 08/07/19 1647  GLUCAP 92 90 86 85 105*   D-Dimer No results for input(s): DDIMER in the last 72 hours. Hgb A1c No results for input(s): HGBA1C in the last 72 hours. Lipid Profile No results for input(s): CHOL, HDL, LDLCALC, TRIG, CHOLHDL, LDLDIRECT in the last 72 hours. Thyroid function studies No results for input(s): TSH, T4TOTAL, T3FREE, THYROIDAB in the last 72 hours.  Invalid input(s): FREET3 Anemia work up No results for input(s): VITAMINB12, FOLATE, FERRITIN, TIBC, IRON, RETICCTPCT in the last 72 hours. Urinalysis    Component Value Date/Time   COLORURINE YELLOW 06/08/2019 2113   APPEARANCEUR CLEAR 06/08/2019 2113   LABSPEC 1.026 06/08/2019 2113   PHURINE 5.0 06/08/2019 2113   GLUCOSEU NEGATIVE 06/08/2019 2113   HGBUR NEGATIVE 06/08/2019 2113   [place] NEGATIVE 06/08/2019 2113   [place] NEGATIVE 06/08/2019 2113   PROTEINUR 30 (A) 06/08/2019 2113   NITRITE NEGATIVE 06/08/2019 2113   LEUKOCYTESUR NEGATIVE 06/08/2019 2113   Sepsis Labs Invalid input(s): PROCALCITONIN,  WBC,  LACTICIDVEN Microbiology Recent Results (from the past 240 hour(s))  Respiratory Panel by RT PCR (Flu A&B, Covid) - Nasopharyngeal Swab     Status: None   Collection Time: 07/30/19 10:42 AM   Specimen: Nasopharyngeal Swab  Result Value Ref Range Status   SARS Coronavirus 2 by RT PCR NEGATIVE NEGATIVE Final    Comment: (NOTE) SARS-CoV-2 target nucleic acids are NOT DETECTED. The SARS-CoV-2 RNA is generally detectable in upper respiratoy specimens during the acute phase of infection. The lowest concentration of SARS-CoV-2 viral copies this assay can detect is 131 copies/mL. A negative result does not preclude SARS-Cov-2 infection and should not be used as the sole basis for treatment or other  patient management decisions. A negative result may occur with  improper specimen collection/handling, submission of specimen other than nasopharyngeal swab, presence of viral mutation(s) within the areas targeted by this assay, and inadequate number of viral copies (<131 copies/mL). A negative result must be combined with clinical observations, patient history, and epidemiological information. The expected result is Negative. Fact Sheet for Patients:  PinkCheek.be Fact Sheet for Healthcare Providers:  GravelBags.it This test is not yet ap proved or cleared by the [place] FDA and  has been authorized for detection and/or diagnosis of SARS-CoV-2 by FDA under an Emergency Use Authorization (EUA). This EUA will remain  in effect (meaning this test can be used) for the duration of the COVID-19 declaration under Section 564(b)(1) of  the Act, 21 U.S.C. section 360bbb-3(b)(1), unless the authorization is terminated or revoked sooner.    Influenza A by PCR NEGATIVE NEGATIVE Final   Influenza B by PCR NEGATIVE NEGATIVE Final    Comment: (NOTE) The Xpert Xpress SARS-CoV-2/FLU/RSV assay is intended as an aid in  the diagnosis of influenza from Nasopharyngeal swab specimens and  should not be used as a sole basis for treatment. Nasal washings and  aspirates are unacceptable for Xpert Xpress SARS-CoV-2/FLU/RSV  testing. Fact Sheet for Patients: PinkCheek.be Fact Sheet for Healthcare Providers: GravelBags.it This test is not yet approved or cleared by the [place] FDA and  has been authorized for detection and/or diagnosis of SARS-CoV-2 by  FDA under an Emergency Use Authorization (EUA). This EUA will remain  in effect (meaning this test can be used) for the duration of the  Covid-19 declaration under Section 564(b)(1) of the Act, 21  U.S.C. section 360bbb-3(b)(1), unless  the authorization is  terminated or revoked. Performed at [facility], [address] [place], [place] [address]   Culture, blood (Routine X 2) w Reflex to ID Panel     Status: None   Collection Time: 07/30/19  6:13 PM   Specimen: BLOOD  Result Value Ref Range Status   Specimen Description BLOOD LAC  Final   Special Requests   Final    BOTTLES DRAWN AEROBIC ONLY Blood Culture results may not be optimal due to an inadequate volume of blood received in culture bottles   Culture   Final    NO GROWTH 5 DAYS Performed at [facility], [address] [place], [place] [address]    Report Status 08/04/2019 FINAL  Final  Culture, blood (Routine X 2) w Reflex to ID Panel     Status: None   Collection Time: 07/30/19  6:25 PM   Specimen: BLOOD  Result Value Ref Range Status   Specimen Description BLOOD [facility]  Final   Special Requests   Final    BOTTLES DRAWN AEROBIC AND ANAEROBIC Blood Culture adequate volume   Culture   Final    NO GROWTH 5 DAYS Performed at [facility], [address] [place], [place] [address]    Report Status 08/04/2019 FINAL  Final  MRSA PCR Screening     Status: None   Collection Time: 07/31/19 11:00 AM   Specimen: Nasopharyngeal  Result Value Ref Range Status   MRSA by PCR NEGATIVE NEGATIVE Final    Comment:        The GeneXpert MRSA Assay (FDA approved for NASAL specimens only), is one component of a comprehensive MRSA colonization surveillance program. It is not intended to diagnose MRSA infection nor to guide or monitor treatment for MRSA infections. Performed at [facility], [address] [place], [place] [address]      Total time spend on discharging this patient, including the last patient exam, discussing the hospital stay, instructions for ongoing care as it relates to all pertinent caregivers, as well as preparing the medical discharge records, prescriptions, and/or referrals as applicable, is 40  minutes.    Enzo Bi, MD  [facility] 08/08/2019, 9:57 AM  If 7PM-7AM, please contact night-coverage

## 2019-08-08 NOTE — TOC Progression Note (Signed)
Transition of Care [facility]) - Progression Note    Patient Details  Name: Kyle [name] MRN: [id number] Date of Birth: Jul 04, 1955  Transition of Care [facility]) CM/SW [place], RN Phone Number: 08/08/2019, 1:33 PM  Clinical Narrative:   CM called EMS and lined up transport to go to [facility] after 3 PM once his ABX finishes running, [facility] Nurse Jun is aware.    Expected Discharge Plan: [place] Barriers to Discharge: Barriers Resolved  Expected Discharge Plan and Services Expected Discharge Plan: [place]         Expected Discharge Date: 08/08/19                                     Social Determinants of Health (SDOH) Interventions    Readmission Risk Interventions Readmission Risk Prevention Plan 08/08/2019  Transportation Screening Complete  Medication Review (RN Care Manager) Referral to Pharmacy  PCP or Specialist appointment within 3-5 days of discharge Complete  Skilled Nursing Facility Complete  Some recent data might be hidden

## 2019-08-08 NOTE — Progress Notes (Signed)
Patient's PICC line was removed as pt will be DC with no IV antibiotic, facility was notified. Given report to Nurse in the facility.

## 2019-08-11 ENCOUNTER — Other Ambulatory Visit: Payer: Self-pay

## 2019-08-11 NOTE — Patient Outreach (Signed)
First telephone outreach attempt to obtain mRs. Patient answered telephone but unable to communicate clearly enough to answer questions successfully.   CMA called daughter per emergency contact on file. Unable to leave voicemail for returned call as mailbox is full.   Will attempt call back within a week.  [place] Management Assistant [phone]

## 2019-08-25 ENCOUNTER — Other Ambulatory Visit: Payer: Self-pay | Admitting: Family Medicine

## 2019-08-25 DIAGNOSIS — R1312 Dysphagia, oropharyngeal phase: Secondary | ICD-10-CM

## 2019-08-29 ENCOUNTER — Other Ambulatory Visit: Payer: Self-pay

## 2019-08-29 ENCOUNTER — Ambulatory Visit
Admission: RE | Admit: 2019-08-29 | Discharge: 2019-08-29 | Disposition: A | Discharge status: Home / Self Care | Payer: Medicare PPO | Source: Ambulatory Visit | Attending: Family Medicine | Admitting: Family Medicine

## 2019-08-29 DIAGNOSIS — R1312 Dysphagia, oropharyngeal phase: Secondary | ICD-10-CM | POA: Insufficient documentation

## 2019-08-29 IMAGING — RF DG SWALLOWING FUNCTION
7 series · 13 of 24 positions shown · non-contrast
Comparison: No recent.

CLINICAL DATA: Dysphagia.

EXAM:
MODIFIED BARIUM SWALLOW
TECHNIQUE: Different consistencies of barium were administered orally to the
patient by the Speech Pathologist. Imaging of the pharynx was
performed in the lateral projection. The radiologist was present in
the fluoroscopy room for this study, providing personal supervision.
FLUOROSCOPY TIME:  Fluoroscopy Time:  1 minutes 42 seconds
Radiation Exposure Index (if provided by the fluoroscopic device):
2.8 mGy

[Series 1: run · 2 of 195 frames shown (1 of 7)]
[frame 19/195]
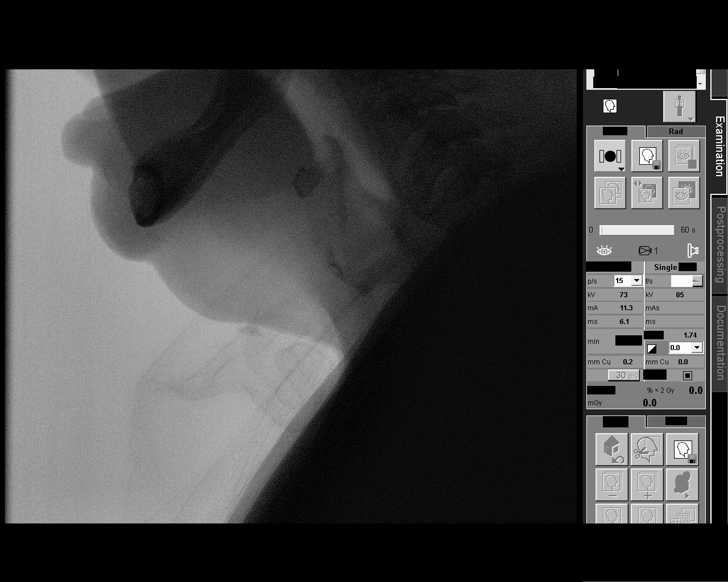
[frame 98/195]
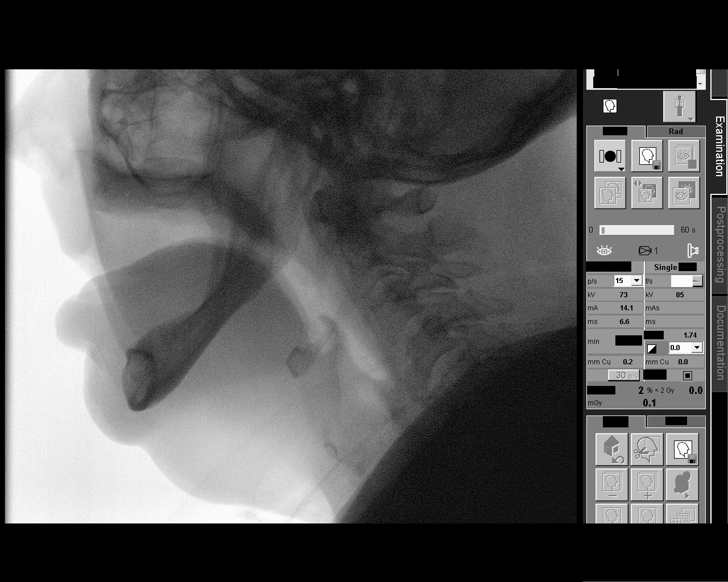

[Series 2: run · 2 of 591 frames shown (2 of 7)]
[frame 89/591]
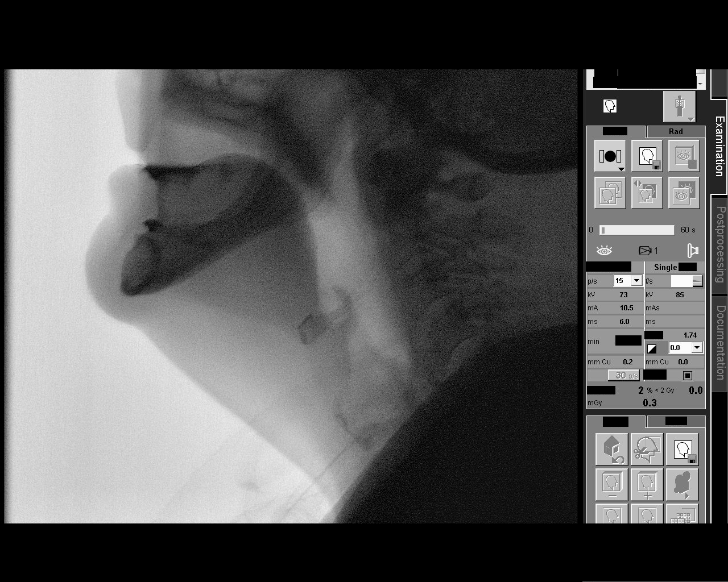
[frame 503/591]
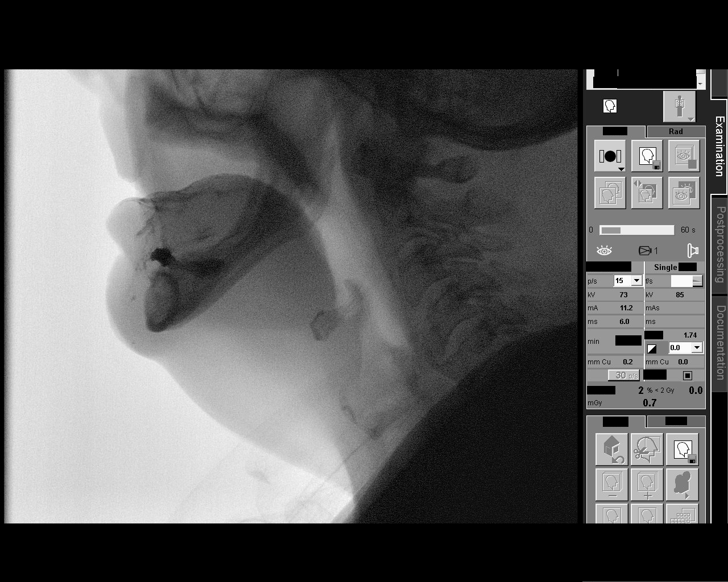

[Series 4: run · 1 of 307 frames shown (3 of 7)]
[frame 154/307]
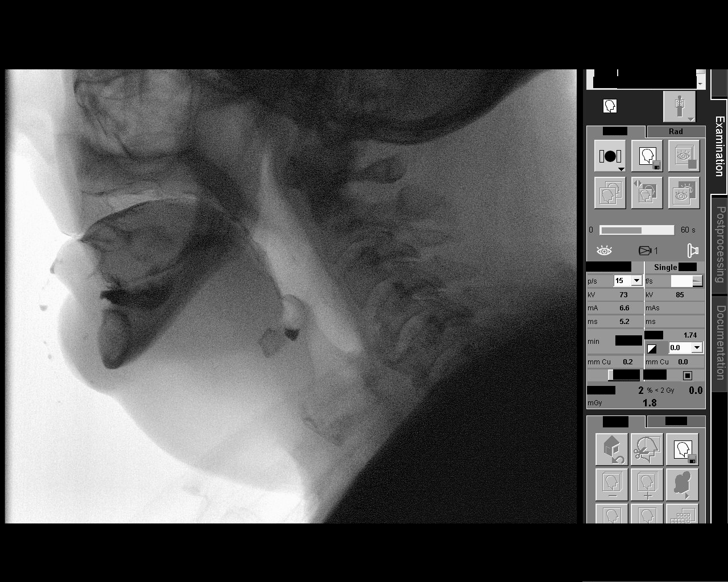

[Series 5: run · 3 of 46 frames shown (4 of 7)]
[frame 7/46]
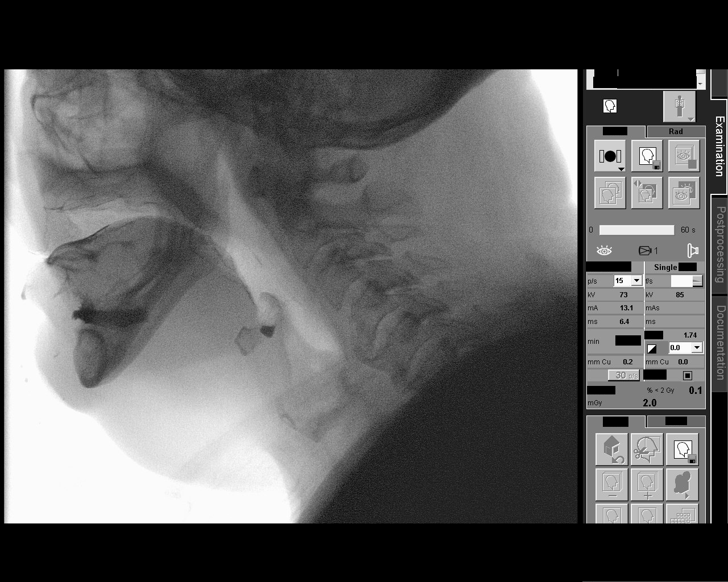
[frame 24/46]
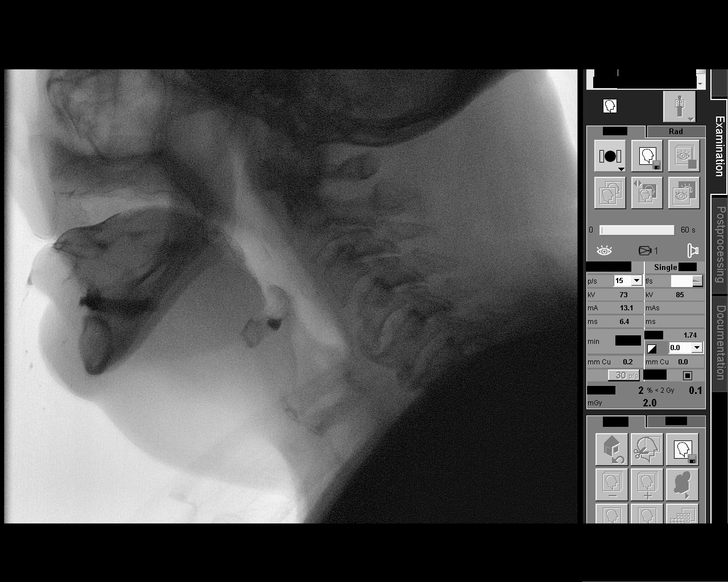
[frame 40/46]
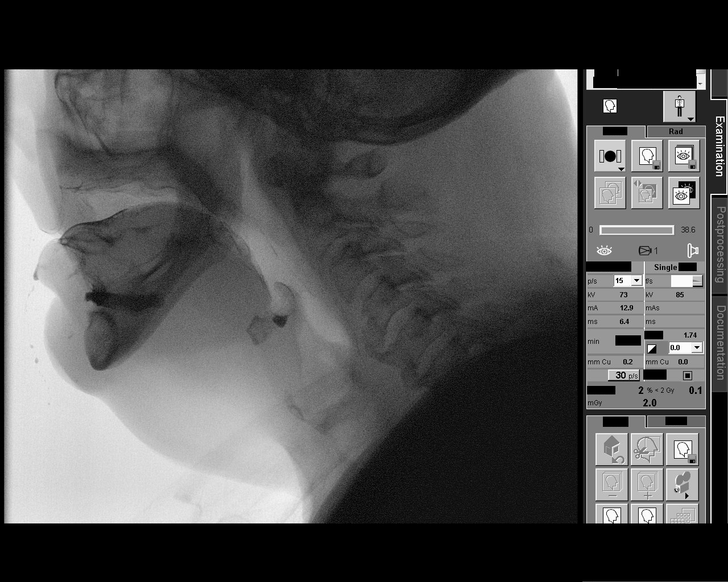

[Series 7: run · 1 of 34 frames shown (5 of 7)]
[frame 22/34]
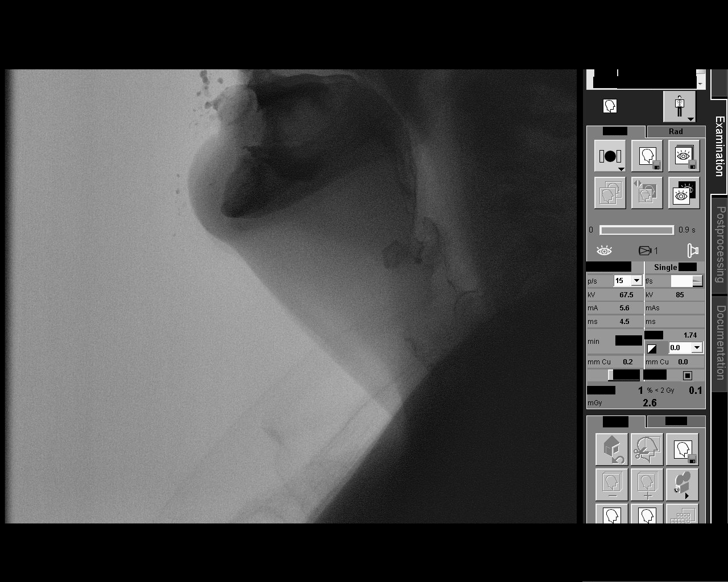

[Series 8: run · 2 of 56 frames shown (6 of 7)]
[frame 1/56]
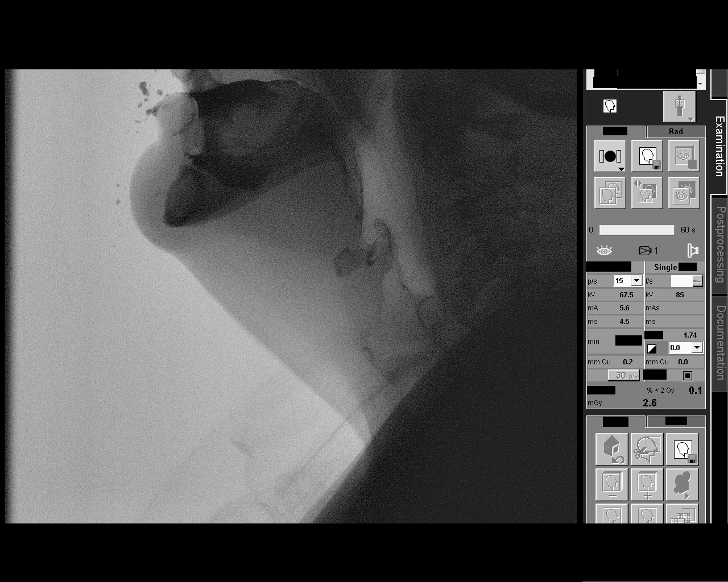
[frame 29/56]
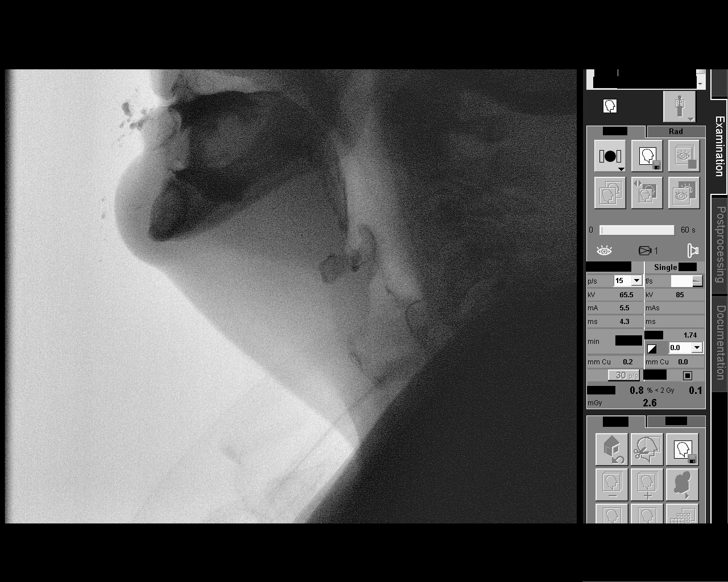

[Series 9: run · 2 of 168 frames shown (7 of 7)]
[frame 85/168]
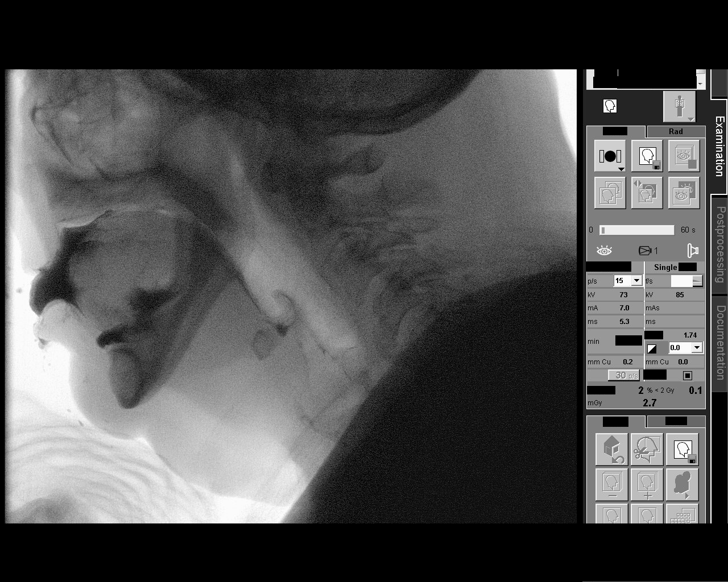
[frame 143/168]
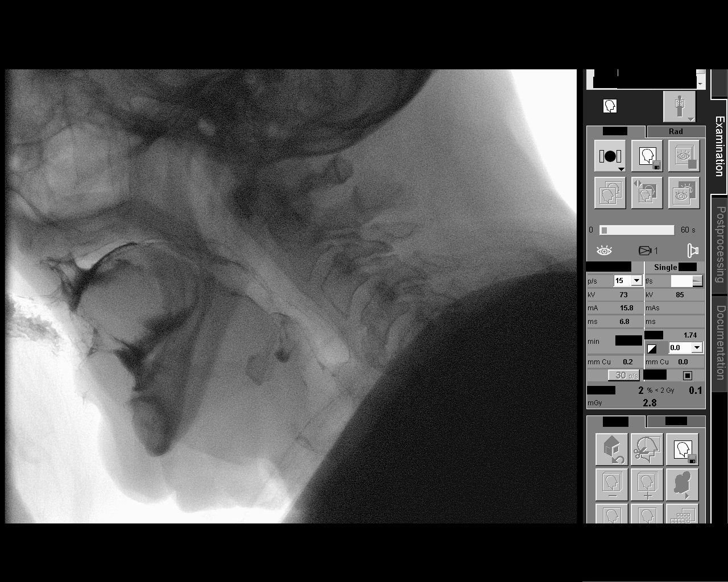

[13 of 24 positions shown; findings below may reference images not displayed]

FINDINGS: Patient could not swallow fluids. Reference is made to speech
pathology report for complete details and recommendations.
IMPRESSION: Patient could not swallow fluids.

Please refer to the Speech Pathologists report for complete details
and recommendations.

## 2019-08-29 IMAGING — RF DG SWALLOWING FUNCTION
1 series · 1 of 1 positions shown · non-contrast
Comparison: No recent.

CLINICAL DATA: Dysphagia.

EXAM:
MODIFIED BARIUM SWALLOW
TECHNIQUE: Different consistencies of barium were administered orally to the
patient by the Speech Pathologist. Imaging of the pharynx was
performed in the lateral projection. The radiologist was present in
the fluoroscopy room for this study, providing personal supervision.
FLUOROSCOPY TIME:  Fluoroscopy Time:  1 minutes 42 seconds
Radiation Exposure Index (if provided by the fluoroscopic device):
2.8 mGy

[Series 1: cp_standard · 0.17mm/px · 1 of 1 slices shown]
[im 1/1]
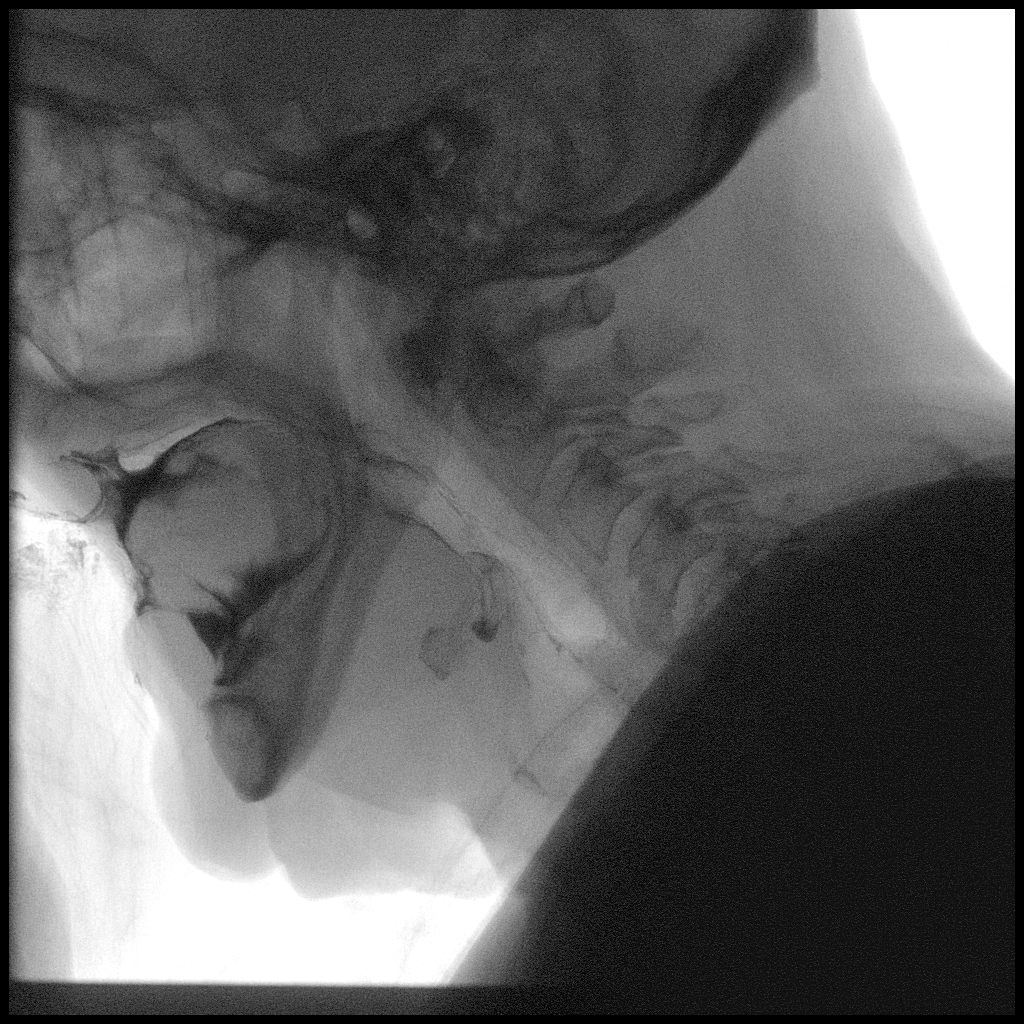

[1 of 1 positions shown; findings below may reference images not displayed]

FINDINGS: Patient could not swallow fluids. Reference is made to speech
pathology report for complete details and recommendations.
IMPRESSION: Patient could not swallow fluids.

Please refer to the Speech Pathologists report for complete details
and recommendations.

## 2019-08-29 NOTE — Patient Outreach (Addendum)
Telephone outreach to patient's nurse at SNF to obtain mRS was successfully completed. MRS=5   [facility] Management Assistant [phone]

## 2019-08-29 NOTE — Therapy (Signed)
[place], [place], [address] Phone: [phone]   Fax:     Modified Barium Swallow  Patient Details  Name: Kyle [name] MRN: [id number] Date of Birth: 02/15/1956 No data recorded  Encounter Date: 08/29/2019  End of Session - 08/29/19 1345    Visit Number  1    Number of Visits  1    Date for SLP Re-Evaluation  08/29/19       Past Medical History:  Diagnosis Date  . Arthritis   . Atrial fibrillation    HX OF SICK SINUS SYNDROME-ATRIAL FIB  . Back pain, chronic    PT STATES 5 BULGING DISKS AND PINCHED NERVES--PT ON OXYCODONE 4 TIMES A DAY FOR HIS PAIN  . Chest pain 08/26/2015  . Chronic airway obstruction, not elsewhere classified 12/21/2012  . GERD (gastroesophageal reflux disease)   . Hypertension   . Impaired fasting glucose 04/19/2013  . Peripheral vascular disease (HCC)    CHRONIC VENOUS INSUFFICIENCY  . Shortness of breath   . 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 Stroke ([place])   . Wears dentures     Past Surgical History:  Procedure Laterality Date  . ESOPHAGOGASTRODUODENOSCOPY  05/18/2011   Procedure: ESOPHAGOGASTRODUODENOSCOPY (EGD);  Surgeon: Shann Medal, MD;  Location: [facility];  Service: Endoscopy;  Laterality: N/A;  . FOOT SURGERY  2003   right foot surgery from work accident   . GASTRIC BYPASS  10/19/11  . GASTRIC RESTRICTION SURGERY  1992  . IR ANGIO VERTEBRAL SEL SUBCLAVIAN INNOMINATE UNI R MOD SED  04/30/2019  . IR CT HEAD LTD  04/30/2019  . IR GASTROSTOMY TUBE REMOVAL  08/07/2019  . IR PERCUTANEOUS ART THROMBECTOMY/INFUSION INTRACRANIAL INC DIAG ANGIO  04/30/2019  . IR REPLC DUODEN/JEJUNO TUBE PERCUT W/FLUORO  06/05/2019  . LAPAROSCOPIC INSERTION GASTROSTOMY TUBE N/A 05/16/2019   Procedure: LAPAROSCOPIC JEJUNOSTOMY TUBE;  Surgeon: Ralene Ok, MD;  Location: [facility];  Service: General;   Laterality: N/A;  . LEG SURGERY  1998   calcium deposit removed on right  leg   . RADIOLOGY WITH ANESTHESIA N/A 04/30/2019   Procedure: CODE STROKE;  Surgeon: Radiologist, Medication, MD;  Location: [facility];  Service: Radiology;  Laterality: N/A;    There were no vitals filed for this visit.   Subjective: Patient behavior: (alertness, ability to follow instructions, etc.):  The patient is alert and able to follow directions within his physical capabilities. The patient presents with sever-profound dysarthria with minimally intelligible speech  Chief complaint: S/P CVA, NPO with PEG   Objective:  Radiological Procedure: A videoflouroscopic evaluation of oral-preparatory, reflex initiation, and pharyngeal phases of the swallow was performed; as well as a screening of the upper esophageal phase.  I. POSTURE: Upright in a standing transfer aid  II. VIEW: Lateral  III. COMPENSATORY STRATEGIES: N/A  IV. BOLUSES ADMINISTERED:    Thin Liquid: 1 teaspoon   Nectar-thick Liquid: 1 teaspoon   Honey-thick Liquid: DNT   Puree: 1 teaspoon   Mechanical Soft: DNT  V. RESULTS OF EVALUATION: A. ORAL PREPARATORY PHASE: (The lips, tongue, and velum are observed for strength and coordination)       **Overall Severity Rating: Profound; minimal lingual manipulation and no posterior transfer  B. SWALLOW INITIATION/REFLEX: (The reflex is normal if "triggered" by the time the bolus reached the base of the tongue)  **Overall  Severity Rating: Profound; never elicited  C. PHARYNGEAL PHASE: (Pharyngeal function is normal if the bolus shows rapid, smooth, and continuous transit through the pharynx and there is no pharyngeal residue after the swallow)  **Overall Severity Rating: Cannot assess- minimal material fell into the valleculae  D. LARYNGEAL PENETRATION: (Material entering into the laryngeal inlet/vestibule but not aspirated) Unknown  E. ASPIRATION: Unknown  F. ESOPHAGEAL PHASE: (Screening of  the upper esophagus) No data  ASSESSMENT: This 64 year old man; S/P CVA and profound oropharyngeal dysphagia; is presenting with profound oropharyngeal dysphagia characterized by minimal lingual movement, poor lip closure, and inability to effect posterior transfer of any consistency tested (thin, nectar-thick, and applesauce).  Recommend continuing NPO status with full PEG support for nutrition/hydration.  Recommend continuing speech therapy for treatment for dysarthria and dysphagia.  Recommend limited ice chips (after stringent oral care) for swallowing practice only.  PLAN/RECOMMENDATIONS:   A. Diet: NPO   B. Swallowing Precautions: Stringent oral care   C. Recommended consultation to: N/A   D. Therapy recommendations: continue speech therapy for oral motor control, lingual range of motion, bolus control, bolus cohesion, bolus propulsion without POs, high effort/high intensity phonatory exercises   E. Results and recommendations were discussed with the patient immediately following with study and the final report routed to the referring MD and treating SLP.  Patient will benefit from skilled therapeutic intervention in order to improve the following deficits and impairments:   Dysphagia, oropharyngeal phase - Plan: DG SWALLOW FUNC OP MEDICARE SPEECH PATH, DG SWALLOW FUNC OP MEDICARE SPEECH PATH        Problem List Patient Active Problem List   Diagnosis Date Noted  . Cellulitis 07/31/2019  . Left arm pain 07/30/2019  . AKI (acute kidney injury) ([place]) 07/30/2019  . Normocytic anemia 07/30/2019  . Right anterior knee pain   . Hematoma of leg, right, subsequent encounter   . Hypernatremia   . Aspiration into airway 06/08/2019  . Aspiration pneumonia ([place])   . Dysphagia due to recent cerebrovascular accident 05/18/2019  . Tobacco use disorder 05/18/2019  . Chronic pain syndrome 05/18/2019  . Stroke ([place]) R MCA emb d/t AF not on [facility] s/p partial revascularization w/ IR 04/30/2019   . Middle cerebral artery embolism, right 04/30/2019  . Chronic atrial fibrillation ([place]) 08/26/2015  . Hyperlipidemia LDL goal <100 06/26/2014  . Routine general medical examination at a health care facility 11/21/2013  . Screening PSA (prostate specific antigen) 11/21/2013  . Special screening for malignant neoplasms, colon 11/21/2013  . OSA (obstructive sleep apnea) 08/22/2013  . Morbid obesity ([place]) 04/19/2013  . Other emphysema ([place]) 01/31/2013  . Essential hypertension 12/21/2012  . Insomnia 12/21/2012  . GERD (gastroesophageal reflux disease) 09/20/2012  . Back pain, chronic 09/20/2012  . Obesity hypoventilation syndrome ([place]) 09/20/2012  . Adjustment disorder with mixed anxiety and depressed mood 09/20/2012   Leroy Sea, MS/CCC- SLP  Lou Miner 08/29/2019, 1:46 PM  [facility] [place], [place], [address] Phone: [phone]   Fax:     Name: Kyle [name] MRN: [id number] Date of Birth: 10-17-1955

## 2019-10-27 ENCOUNTER — Emergency Department: Payer: Medicare PPO

## 2019-10-27 ENCOUNTER — Emergency Department
Admission: EM | Admit: 2019-10-27 | Discharge: 2019-10-28 | Disposition: A | Discharge status: Home / Self Care | Payer: Medicare PPO | Attending: Emergency Medicine | Admitting: Emergency Medicine

## 2019-10-27 ENCOUNTER — Encounter: Payer: Self-pay | Admitting: *Deleted

## 2019-10-27 ENCOUNTER — Other Ambulatory Visit: Payer: Self-pay

## 2019-10-27 DIAGNOSIS — I69322 Dysarthria following cerebral infarction: Secondary | ICD-10-CM | POA: Insufficient documentation

## 2019-10-27 DIAGNOSIS — I10 Essential (primary) hypertension: Secondary | ICD-10-CM | POA: Insufficient documentation

## 2019-10-27 DIAGNOSIS — Z20822 Contact with and (suspected) exposure to covid-19: Secondary | ICD-10-CM | POA: Diagnosis not present

## 2019-10-27 DIAGNOSIS — J449 Chronic obstructive pulmonary disease, unspecified: Secondary | ICD-10-CM | POA: Diagnosis not present

## 2019-10-27 DIAGNOSIS — I4891 Unspecified atrial fibrillation: Secondary | ICD-10-CM | POA: Insufficient documentation

## 2019-10-27 DIAGNOSIS — Z79899 Other long term (current) drug therapy: Secondary | ICD-10-CM | POA: Diagnosis not present

## 2019-10-27 DIAGNOSIS — R531 Weakness: Secondary | ICD-10-CM | POA: Insufficient documentation

## 2019-10-27 DIAGNOSIS — Z931 Gastrostomy status: Secondary | ICD-10-CM | POA: Diagnosis not present

## 2019-10-27 LAB — CBC WITH DIFFERENTIAL/PLATELET
Abs Immature Granulocytes: 0.02 10*3/uL (ref 0.00–0.07)
Basophils Absolute: 0 10*3/uL (ref 0.0–0.1)
Basophils Relative: 0 %
Eosinophils Absolute: 0.1 10*3/uL (ref 0.0–0.5)
Eosinophils Relative: 2 %
HCT: 43.9 % (ref 39.0–52.0)
Hemoglobin: 14.2 g/dL (ref 13.0–17.0)
Immature Granulocytes: 0 %
Lymphocytes Relative: 18 %
Lymphs Abs: 1.5 10*3/uL (ref 0.7–4.0)
MCH: 28.7 pg (ref 26.0–34.0)
MCHC: 32.3 g/dL (ref 30.0–36.0)
MCV: 88.7 fL (ref 80.0–100.0)
Monocytes Absolute: 0.8 10*3/uL (ref 0.1–1.0)
Monocytes Relative: 9 %
Neutro Abs: 5.9 10*3/uL (ref 1.7–7.7)
Neutrophils Relative %: 71 %
Platelets: 234 10*3/uL (ref 150–400)
RBC: 4.95 MIL/uL (ref 4.22–5.81)
RDW: 13.5 % (ref 11.5–15.5)
WBC: 8.3 10*3/uL (ref 4.0–10.5)
nRBC: 0 % (ref 0.0–0.2)

## 2019-10-27 LAB — URINALYSIS, COMPLETE (UACMP) WITH MICROSCOPIC
Bilirubin Urine: NEGATIVE
Glucose, UA: NEGATIVE mg/dL
Ketones, ur: NEGATIVE mg/dL
Nitrite: POSITIVE — AB
Protein, ur: 30 mg/dL — AB
Specific Gravity, Urine: 1.018 (ref 1.005–1.030)
pH: 5 (ref 5.0–8.0)

## 2019-10-27 LAB — SARS CORONAVIRUS 2 BY RT PCR (HOSPITAL ORDER, PERFORMED IN ~~LOC~~ HOSPITAL LAB): SARS Coronavirus 2: NEGATIVE

## 2019-10-27 LAB — COMPREHENSIVE METABOLIC PANEL
ALT: 9 U/L (ref 0–44)
AST: 23 U/L (ref 15–41)
Albumin: 3.9 g/dL (ref 3.5–5.0)
Alkaline Phosphatase: 63 U/L (ref 38–126)
Anion gap: 8 (ref 5–15)
BUN: 13 mg/dL (ref 8–23)
CO2: 27 mmol/L (ref 22–32)
Calcium: 9.2 mg/dL (ref 8.9–10.3)
Chloride: 104 mmol/L (ref 98–111)
Creatinine, Ser: 1 mg/dL (ref 0.61–1.24)
GFR calc Af Amer: >60 mL/min (ref >60)
GFR calc non Af Amer: >60 mL/min (ref >60)
Glucose, Bld: 98 mg/dL (ref 70–99)
Potassium: 4.2 mmol/L (ref 3.5–5.1)
Sodium: 139 mmol/L (ref 135–145)
Total Bilirubin: 1 mg/dL (ref 0.3–1.2)
Total Protein: 7.4 g/dL (ref 6.5–8.1)

## 2019-10-27 LAB — LACTIC ACID, PLASMA
Lactic Acid, Venous: 2 mmol/L (ref 0.5–1.9)
Lactic Acid, Venous: 1.7 mmol/L (ref 0.5–1.9)

## 2019-10-27 LAB — CK: Total CK: 72 U/L (ref 49–397)

## 2019-10-27 LAB — TROPONIN I (HIGH SENSITIVITY): Troponin I (High Sensitivity): 6 ng/L (ref <18)

## 2019-10-27 LAB — BRAIN NATRIURETIC PEPTIDE: B Natriuretic Peptide: 46 pg/mL (ref 0.0–100.0)

## 2019-10-27 IMAGING — DX DG PELVIS 1-2V
1 series · 1 of 1 positions shown · non-contrast
Comparison: None.

CLINICAL DATA: Fall

EXAM:
PELVIS - 1-2 VIEW

[pelvis ap]
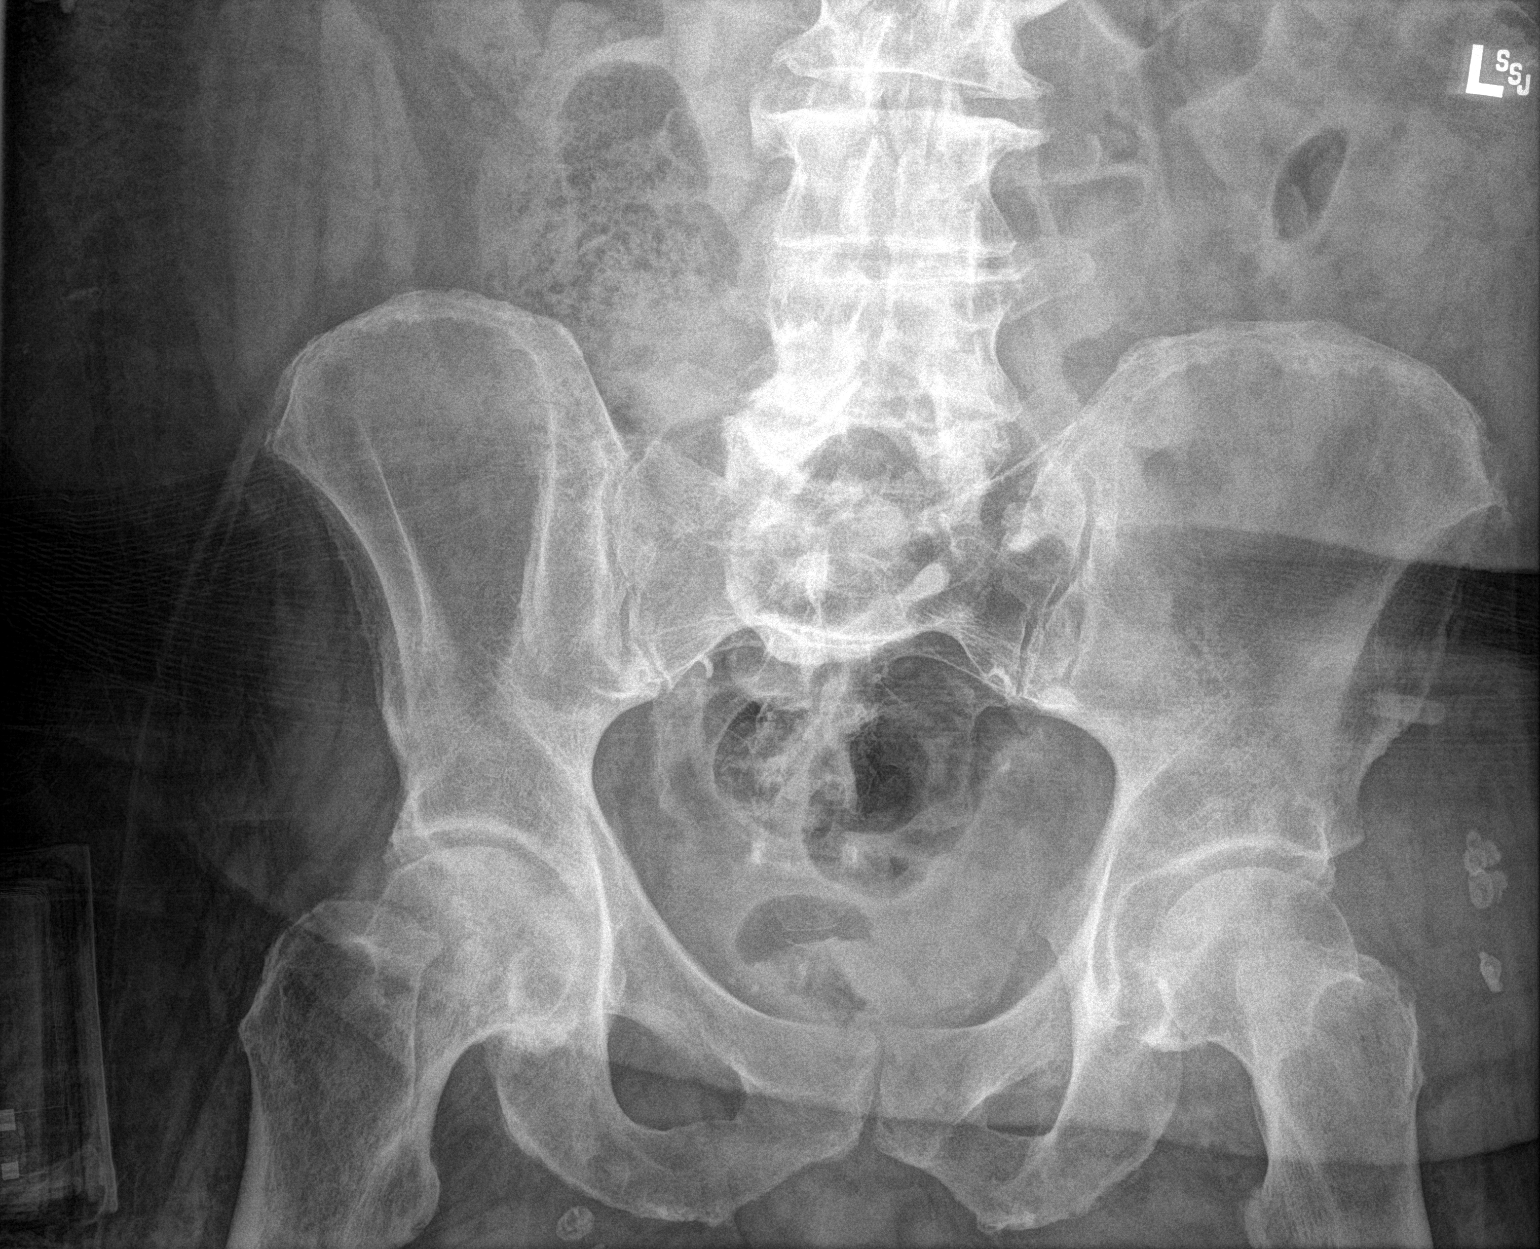

[1 of 1 positions shown; findings below may reference images not displayed]

FINDINGS: Somewhat limited examination due to technique. No definite fracture.
Bilateral hip osteoarthritis is seen with superior joint space loss,
right greater than left. There is diffuse osteopenia. Degenerative
changes in the lower lumbar spine.
IMPRESSION: No definite fracture. If there is high clinical suspicion for occult
hip fracture or the patient refuses to weightbear, consider further
evaluation cross-sectional imaging.

## 2019-10-27 IMAGING — CT CT HEAD W/O CM
3 series · 15 of 47 positions shown, 18 images · non-contrast
Comparison: Head CT [DATE]. brain MRI [DATE]

CLINICAL DATA: Neuro deficit, acute, stroke suspected

EXAM:
CT HEAD WITHOUT CONTRAST
TECHNIQUE: Contiguous axial images were obtained from the base of the skull
through the vertex without intravenous contrast.

[Series 3: head wo · axial · 0.48mm/px · z∈[-151,-11]mm · 9 of 34 slices shown, 12 images]
[im 3/34  brain]
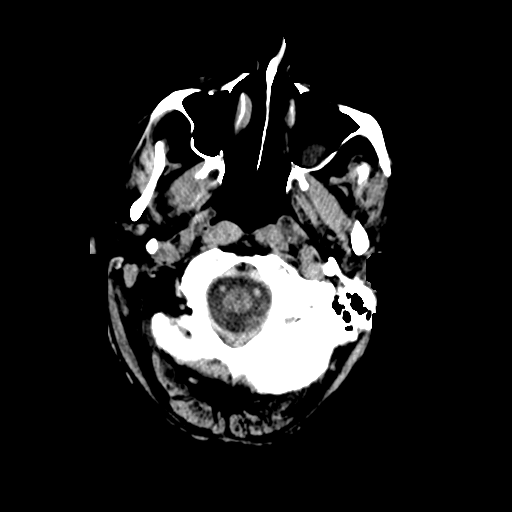
[im 3/34  bone]
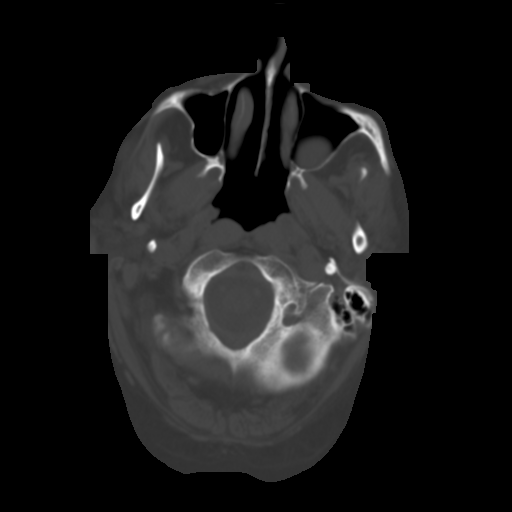
[im 6/34  brain]
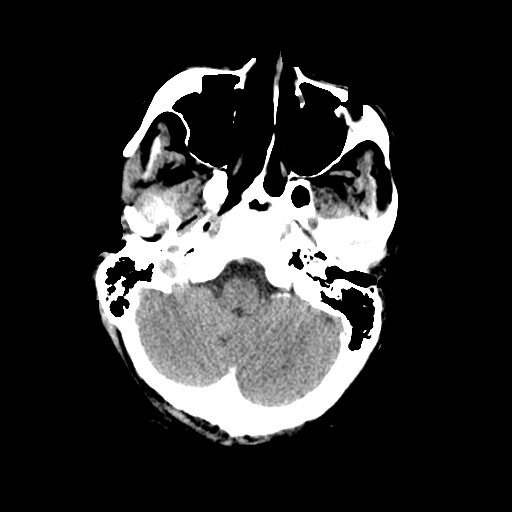
[im 10/34  brain]
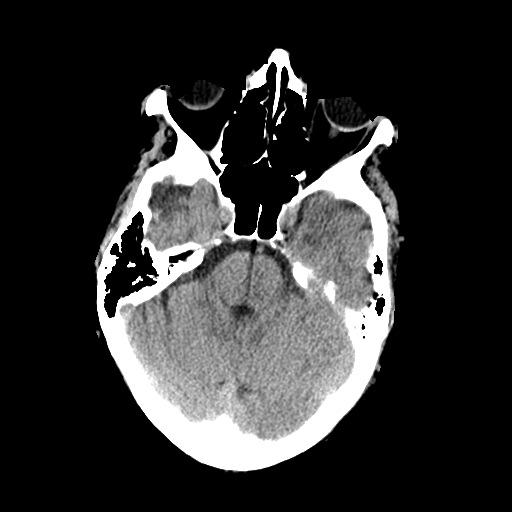
[im 13/34  brain]
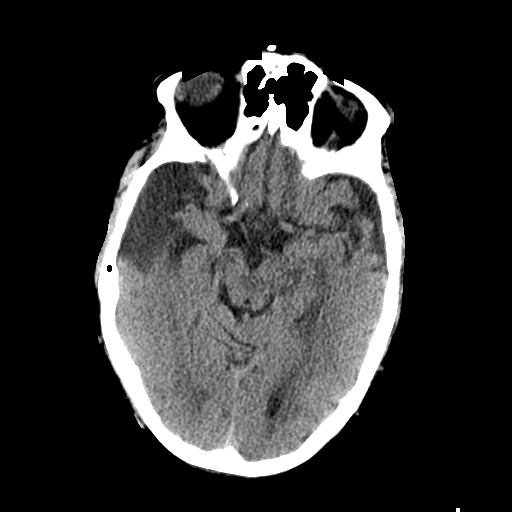
[im 18/34  brain]
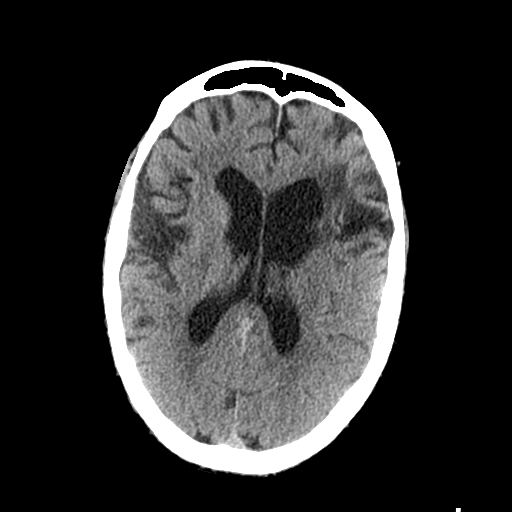
[im 18/34  bone]
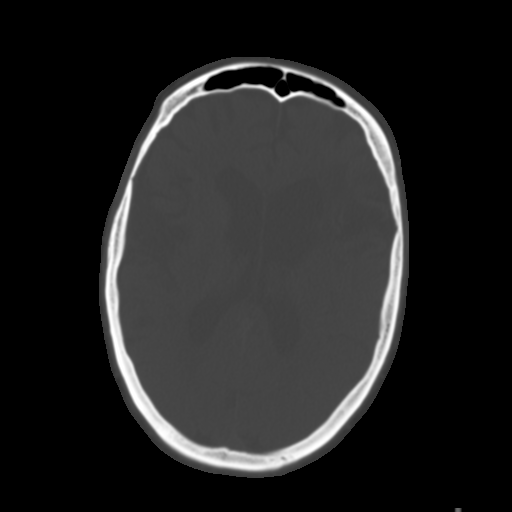
[im 21/34  brain]
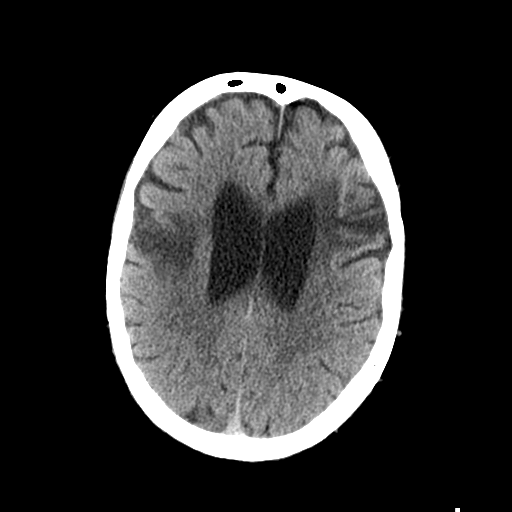
[im 24/34  brain]
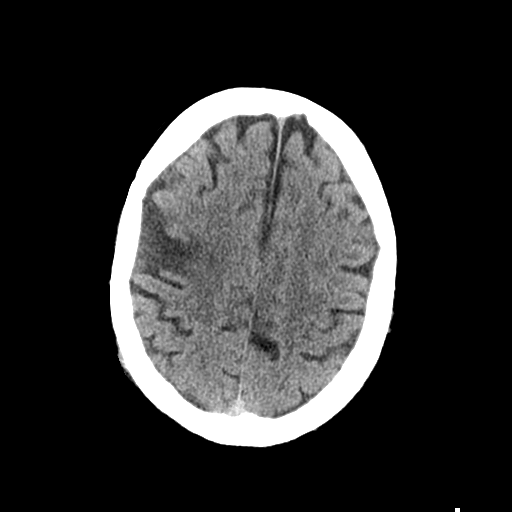
[im 28/34  brain]
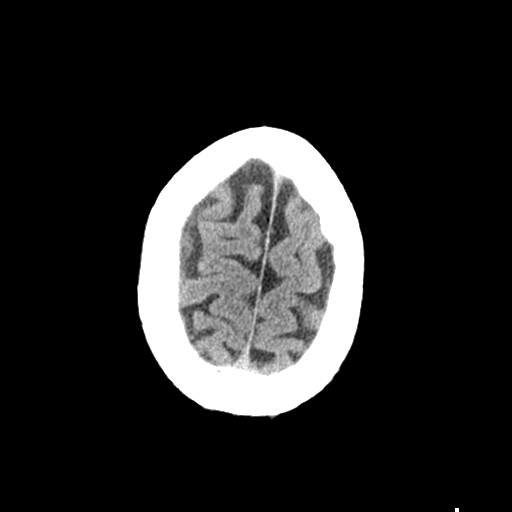
[im 31/34  brain]
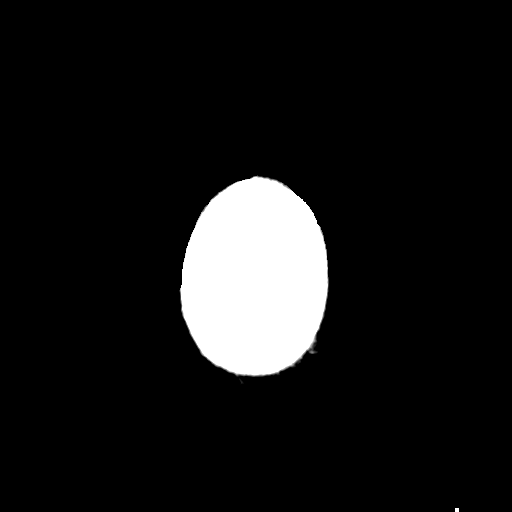
[im 31/34  bone]
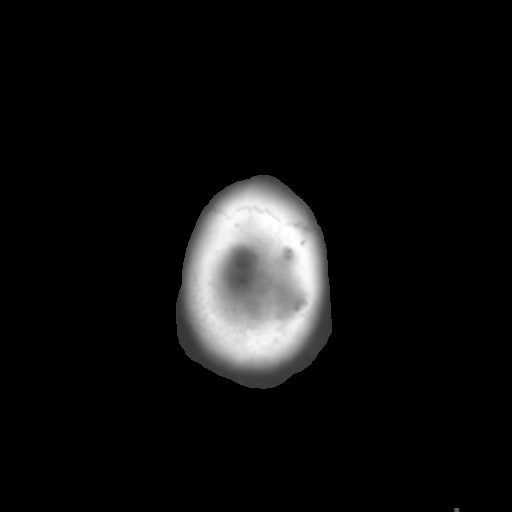

[Series 4: coronal soft tissue · coronal · 0.37mm/px · 3 of 76 slices shown]
[im 26/76  brain]
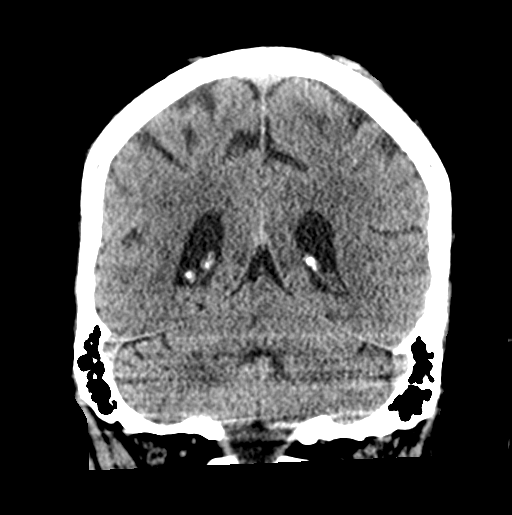
[im 34/76  brain]
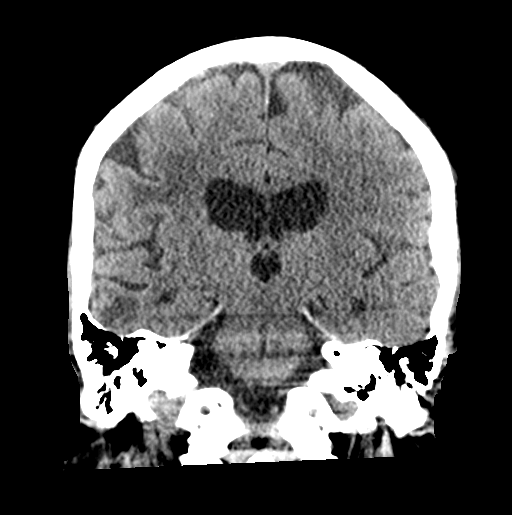
[im 42/76  brain]
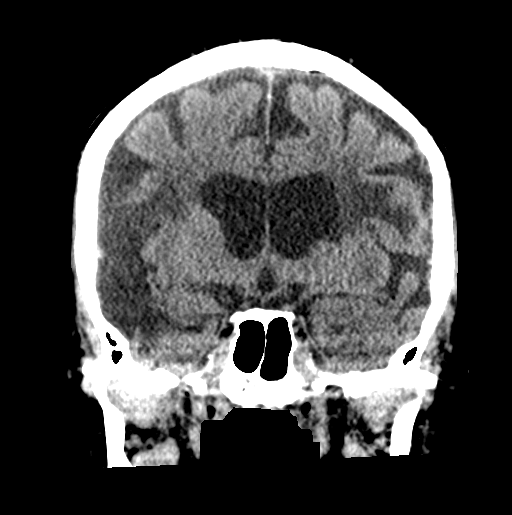

[Series 5: sagittal soft tissue · sagittal · 0.38mm/px · 3 of 56 slices shown]
[im 19/56  brain]
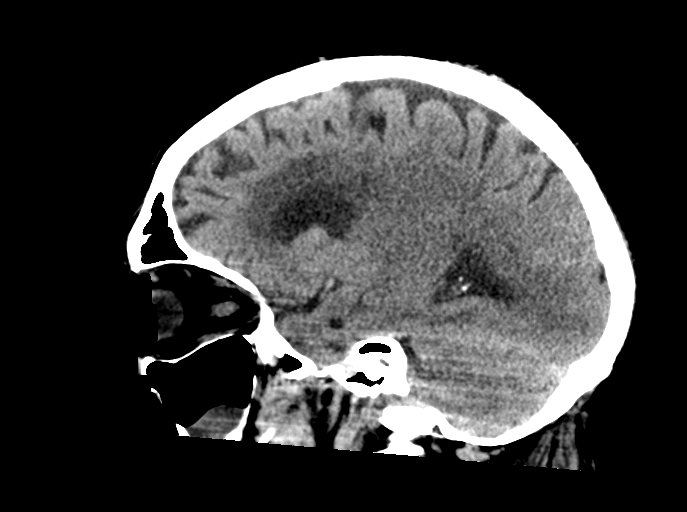
[im 28/56  brain]
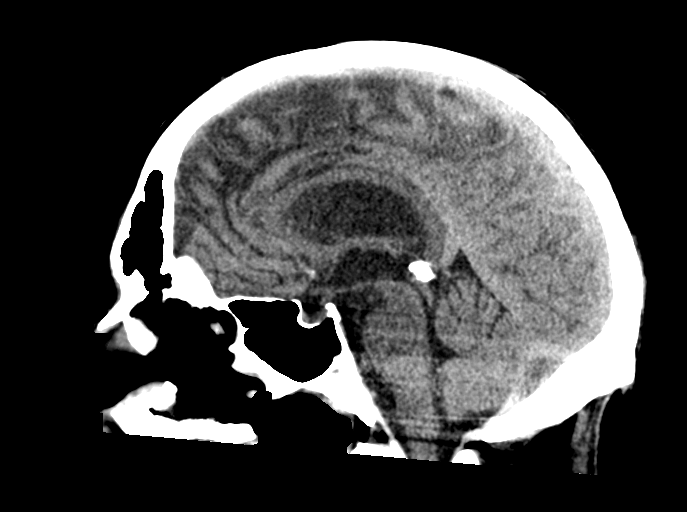
[im 37/56  brain]
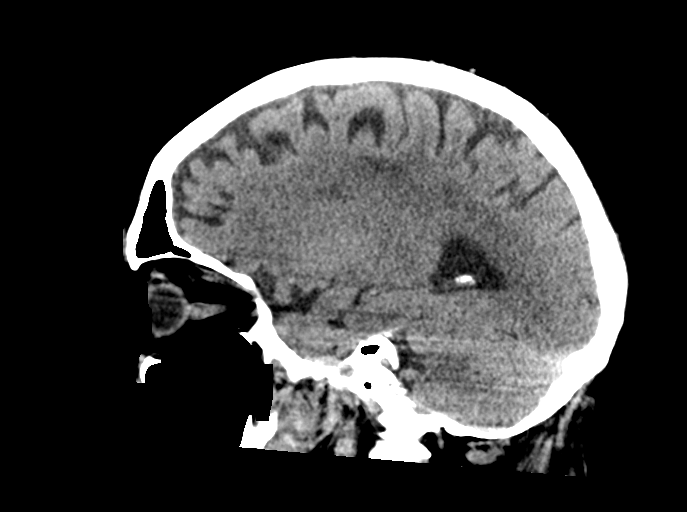

[15 of 47 positions shown; findings below may reference images not displayed]

FINDINGS: Brain: Encephalomalacia in bilateral MCA distribution consistent
with remote infarcts. Interval encephalomalacia in the right MCA
distribution at site of acute ischemia on [DATE] imaging. No
hemorrhage. No obvious acute ischemia, background chronic change
partially limits evaluation. Generalized atrophy and chronic small
vessel ischemia. Remote lacunar infarcts in the left basal ganglia.
No subdural or extra-axial collection.

Vascular: No hyperdense vessel.

Skull: No fracture or focal lesion.

Sinuses/Orbits: Scattered mucosal thickening of ethmoid air cells.
No sinus fluid levels. The mastoid air cells are clear. Mucous
retention cyst in the left maxillary sinus. Orbits are unremarkable.

Other: Chronic scalp calcification versus small foreign body right
parietal region, unchanged from prior.
IMPRESSION: 1. Remote bilateral MCA infarcts. Interval encephalomalacia in the
right MCA distribution at site of acute ischemia on [DATE]
MRI. No hemorrhage or evidence of acute ischemia.
2. Generalized atrophy and chronic small vessel ischemia.

## 2019-10-27 IMAGING — DX DG CHEST 1V PORT
2 series · 2 of 2 positions shown · non-contrast
Comparison: [DATE]

CLINICAL DATA: Weakness

EXAM:
PORTABLE CHEST 1 VIEW

[chest ap (1 of 2)]
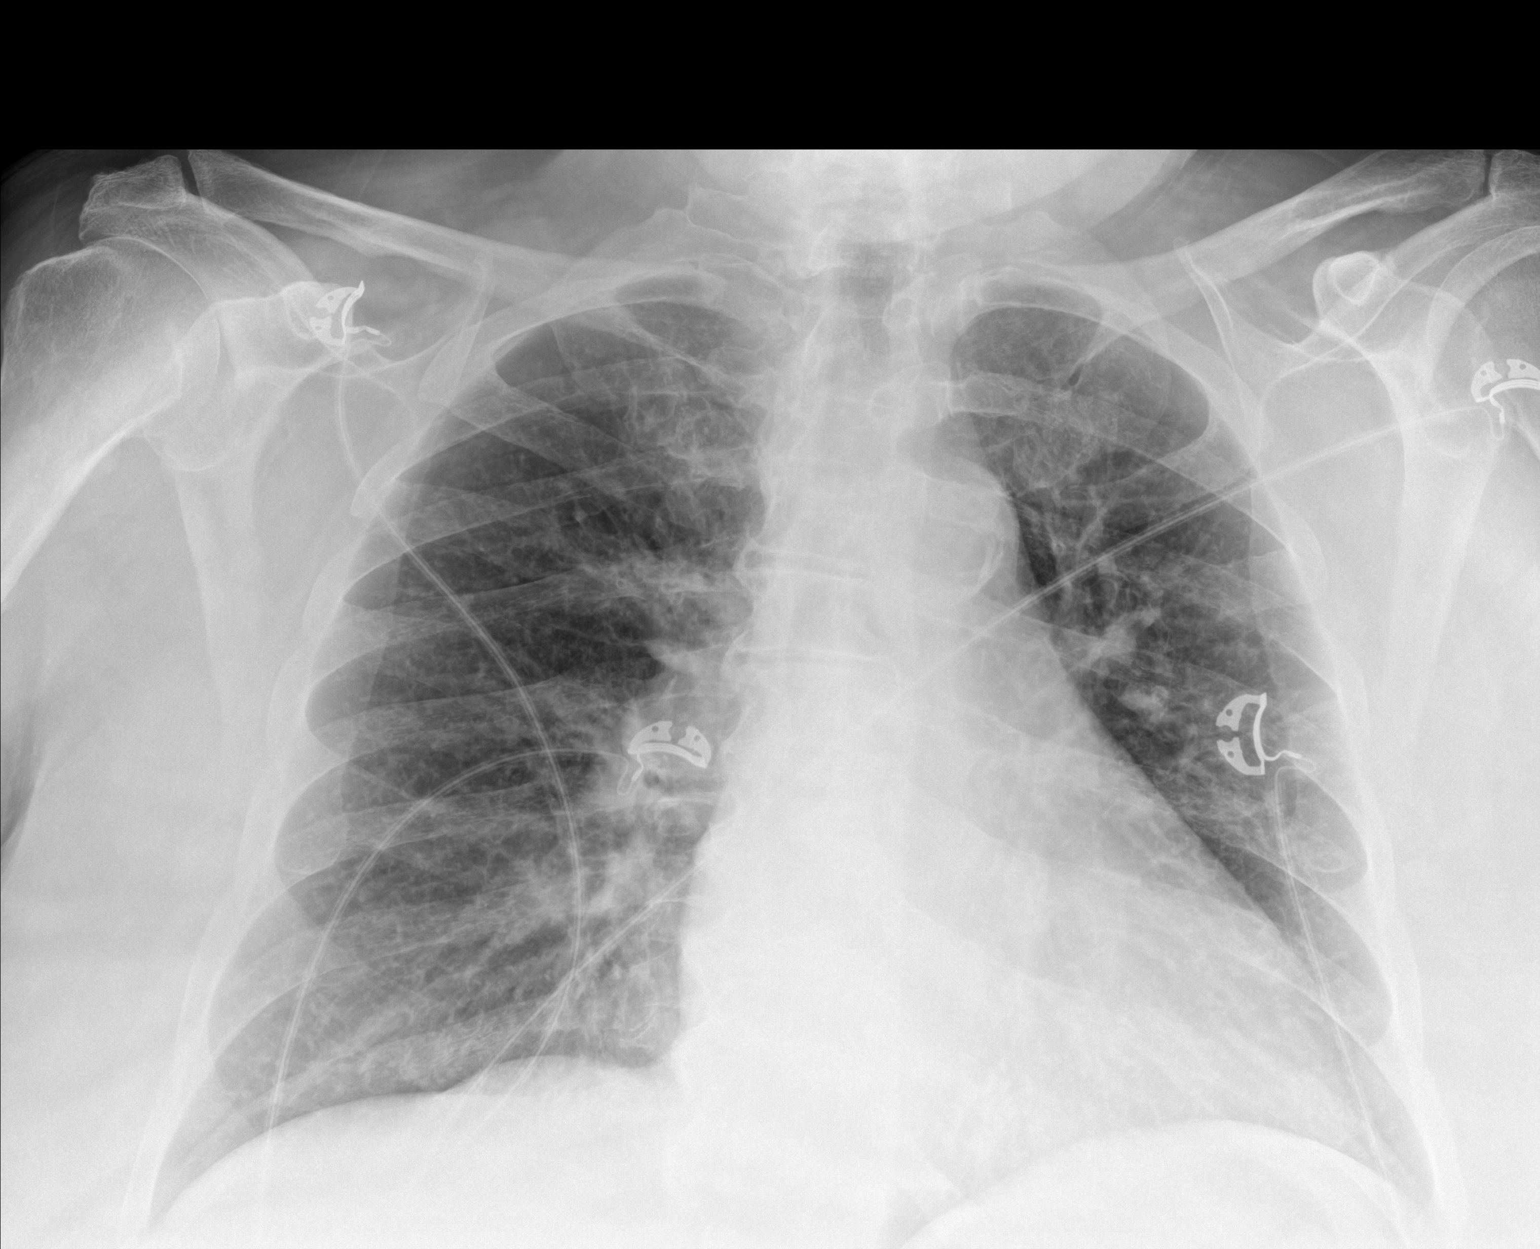

[chest ap (2 of 2)]
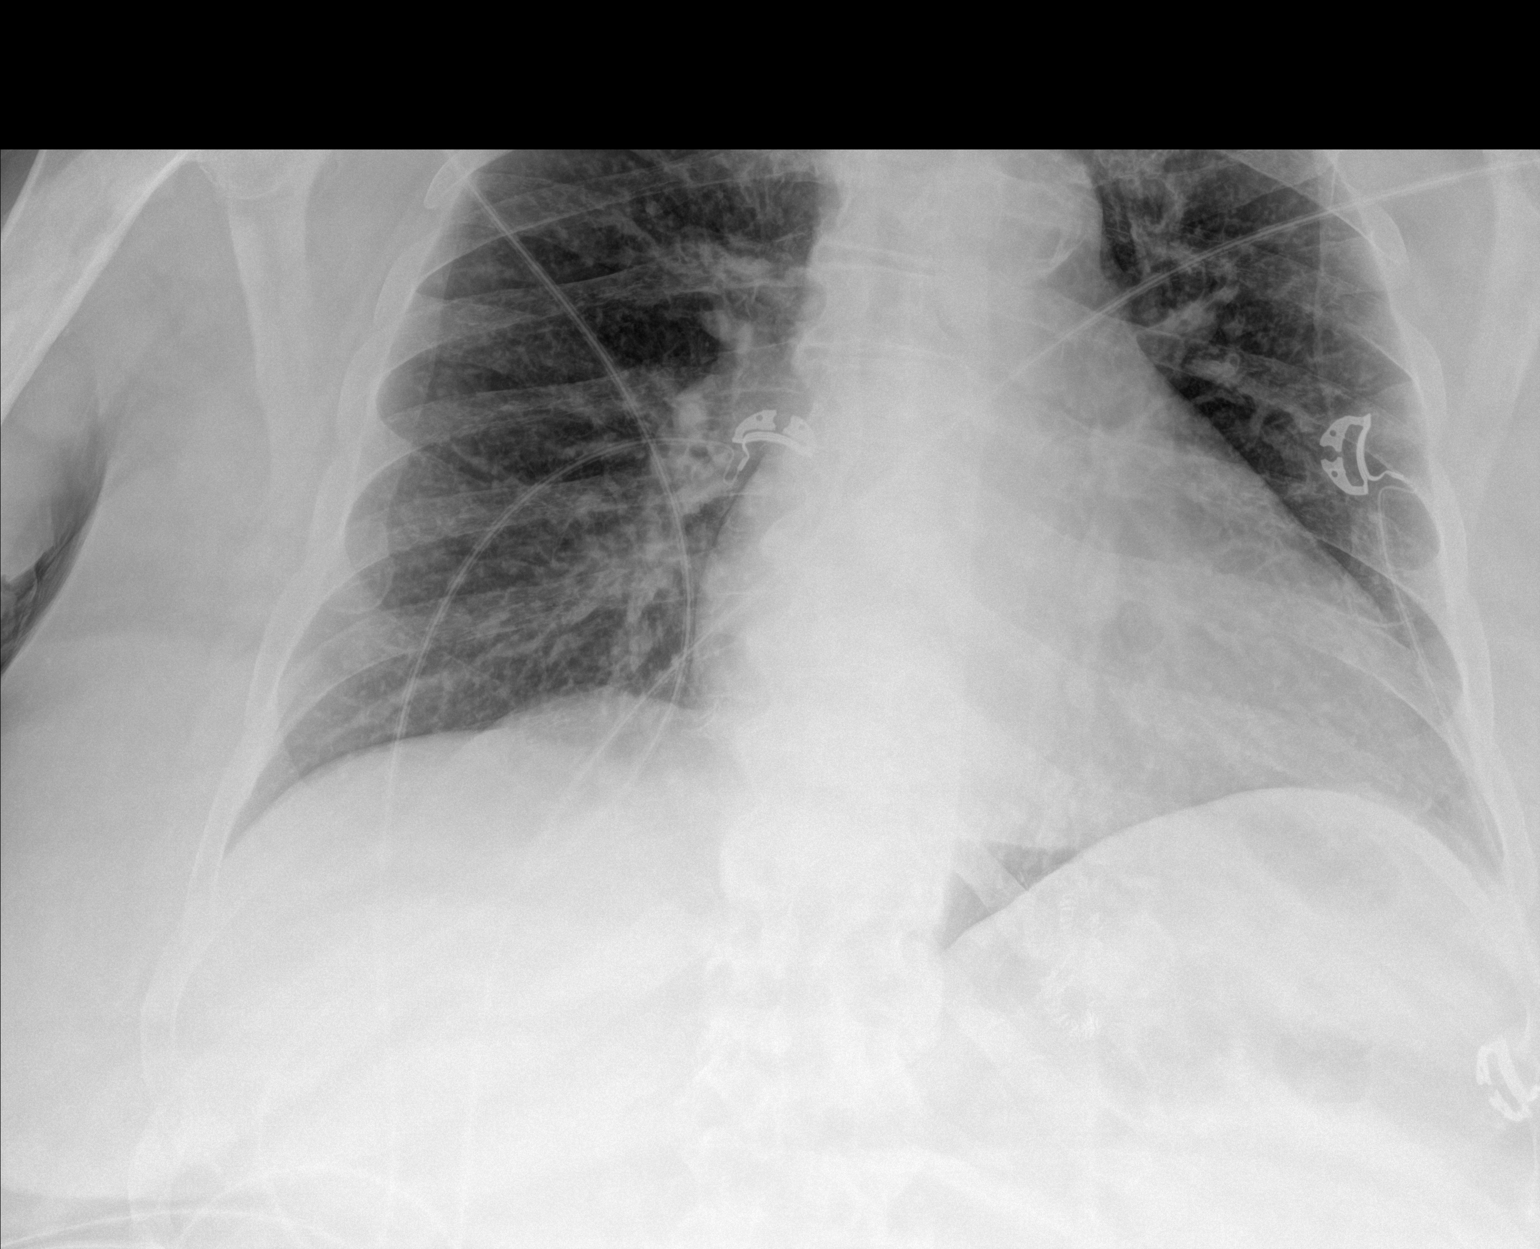

[2 of 2 positions shown; findings below may reference images not displayed]

FINDINGS: Again noted is cardiomegaly. There is prominence of the central
pulmonary vasculature. Mildly increased interstitial markings seen
throughout both lungs. No large airspace consolidation or pleural
effusion. No acute osseous abnormality.
IMPRESSION: Cardiomegaly and findings suggestive of interstitial edema.

## 2019-10-27 IMAGING — DX DG FOREARM 2V*R*
2 series · 2 of 2 positions shown · non-contrast
Comparison: None.

CLINICAL DATA: Fall

EXAM:
RIGHT FOREARM - 2 VIEW

[forearm ap]
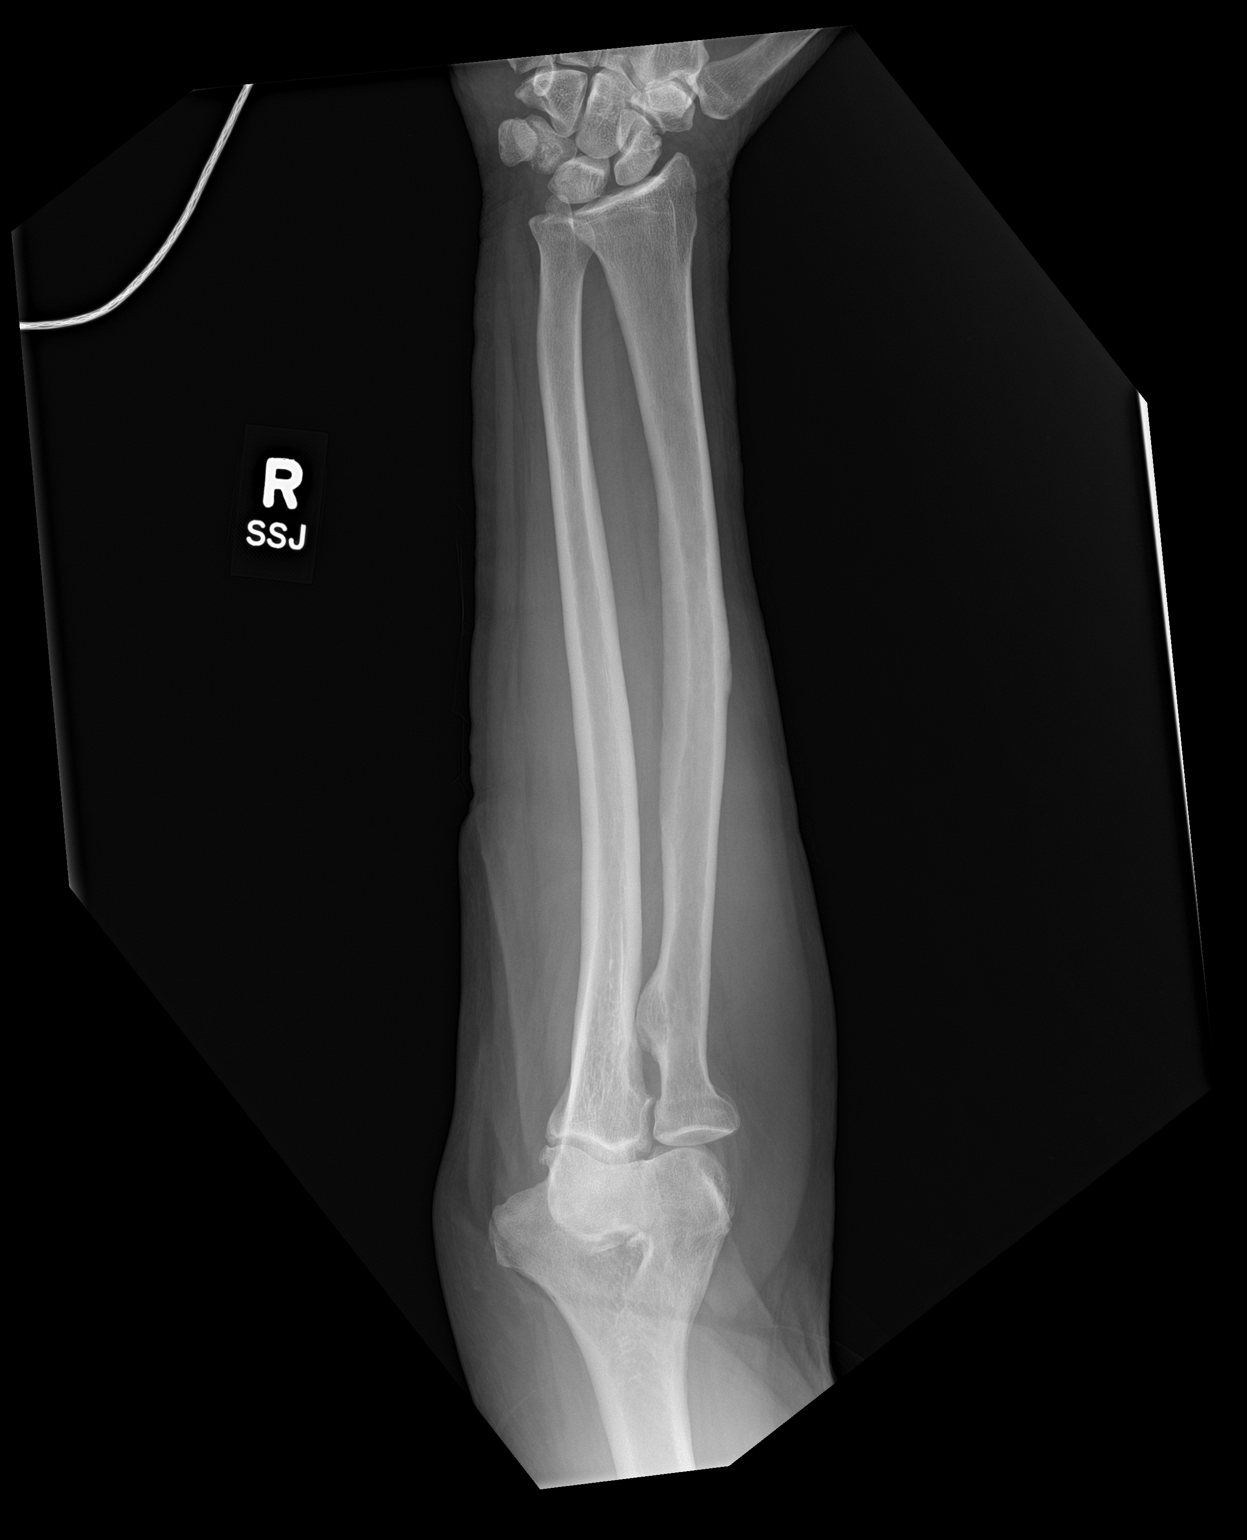

[forearm lat]
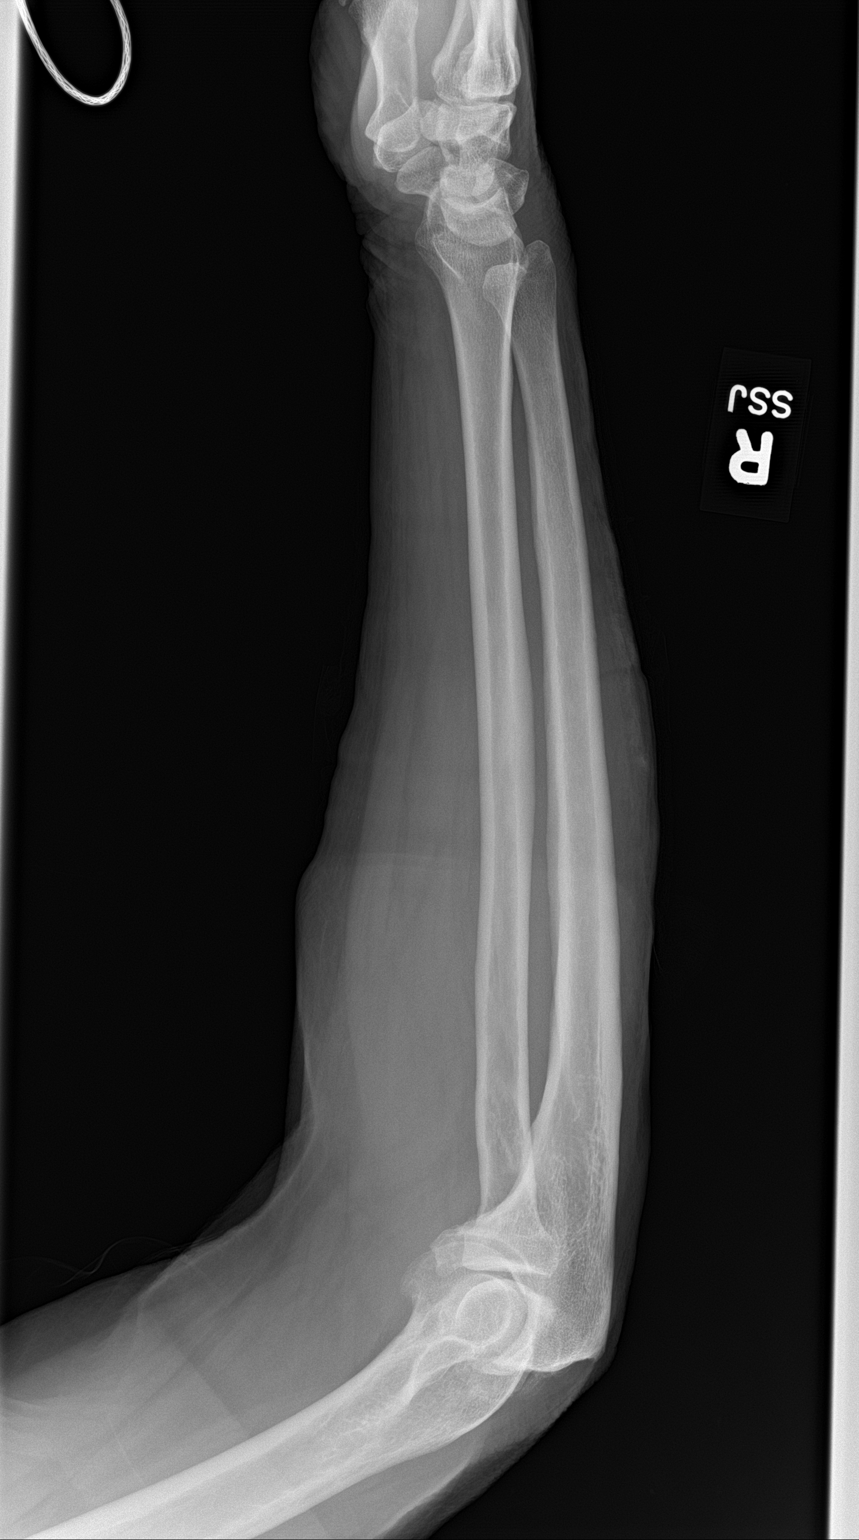

[2 of 2 positions shown; findings below may reference images not displayed]

FINDINGS: There is no evidence of fracture or other focal bone lesions. Mild
dorsal soft tissue swelling is seen.
IMPRESSION: Negative.

## 2019-10-27 MED ORDER — ALPRAZOLAM 0.5 MG PO TABS: 0.5000 mg | ORAL_TABLET | Freq: Two times a day (BID) | ORAL | Status: DC | PRN

## 2019-10-27 MED ORDER — CYCLOBENZAPRINE HCL 10 MG PO TABS: 10.0000 mg | ORAL_TABLET | Freq: Three times a day (TID) | ORAL | Status: DC | PRN

## 2019-10-27 MED ORDER — CEPHALEXIN 500 MG PO CAPS: 500.0000 mg | ORAL_CAPSULE | Freq: Two times a day (BID) | ORAL | Status: DC

## 2019-10-27 MED ORDER — OXYCODONE HCL 5 MG PO TABS
5.0000 mg | ORAL_TABLET | ORAL | Status: DC | PRN
  Filled 2019-10-27: qty 1

## 2019-10-27 MED ORDER — APIXABAN 5 MG PO TABS
5.0000 mg | ORAL_TABLET | Freq: Two times a day (BID) | ORAL | Status: DC
  Filled 2019-10-27: qty 1

## 2019-10-27 MED ORDER — PANTOPRAZOLE SODIUM 40 MG PO TBEC: 40.0000 mg | DELAYED_RELEASE_TABLET | Freq: Every day | ORAL | Status: DC

## 2019-10-27 MED ORDER — DIPHENHYDRAMINE HCL 25 MG PO CAPS: 25.0000 mg | ORAL_CAPSULE | ORAL | Status: DC | PRN

## 2019-10-27 MED ORDER — SODIUM CHLORIDE 0.9 % IV BOLUS
500.0000 mL | Freq: Once | INTRAVENOUS | Status: AC
  Administered 2019-10-27: 500 mL via INTRAVENOUS

## 2019-10-27 MED ORDER — ACETAMINOPHEN 325 MG PO TABS
650.0000 mg | ORAL_TABLET | Freq: Three times a day (TID) | ORAL | Status: DC
  Filled 2019-10-27: qty 2

## 2019-10-27 MED ORDER — ALPRAZOLAM 0.5 MG PO TABS
0.5000 mg | ORAL_TABLET | Freq: Three times a day (TID) | ORAL | Status: DC
  Filled 2019-10-27: qty 1

## 2019-10-27 MED ORDER — GABAPENTIN 300 MG PO CAPS
600.0000 mg | ORAL_CAPSULE | Freq: Four times a day (QID) | ORAL | Status: DC
  Filled 2019-10-27: qty 2

## 2019-10-27 MED ORDER — METOPROLOL TARTRATE 25 MG PO TABS
12.5000 mg | ORAL_TABLET | Freq: Two times a day (BID) | ORAL | Status: DC
  Filled 2019-10-27: qty 1

## 2019-10-27 MED ORDER — ALBUTEROL SULFATE (2.5 MG/3ML) 0.083% IN NEBU
3.0000 mL | INHALATION_SOLUTION | RESPIRATORY_TRACT | Status: DC
  Administered 2019-10-28 (×2): 3 mL via RESPIRATORY_TRACT
  Filled 2019-10-27 (×2): qty 3

## 2019-10-27 NOTE — ED Provider Notes (Signed)
[facility] Emergency Department Provider Note ____________________________________________   First MD Initiated Contact with Patient 10/27/19 1903     (approximate)  I have reviewed the triage vital signs and the nursing notes.   HISTORY  Chief Complaint Weakness  Level 5 caveat: History of present illness limited due to dysarthria  HPI Kyle [name] is a 64 y.o. male with a PMH of atrial fibrillation, hypertension, COPD, obesity, and CVA with right MCA infarct who presents for generalized weakness.  The patient has been at [facility] for long-term care ever since a stroke last year.  He was planning a visit with his friend (former fianc) Amy who was to pick [facility] up from [facility], have him stay with her for 1 week, and then bring him back to the facility.  She was under the impression that he was ambulatory and relatively independent with ADLs.  She attempted to bring him to a hotel yesterday, but the patient could not get out of the car.  EMS was called out and the patient refused transportation to the hospital, but EMS helped him get into the hotel room.  He has not been able to get up from the bed since he was there.  He has a PEG tube, and is unable to eat and drink by mouth.  The patient reports some pain to bilateral legs as well as to the right forearm which he struck when EMS was trying to help him into the hotel room yesterday.  Past Medical History:  Diagnosis Date  . Arthritis   . Atrial fibrillation    HX OF SICK SINUS SYNDROME-ATRIAL FIB  . Back pain, chronic    PT STATES 5 BULGING DISKS AND PINCHED NERVES--PT ON OXYCODONE 4 TIMES A DAY FOR HIS PAIN  . Chest pain 08/26/2015  . Chronic airway obstruction, not elsewhere classified 12/21/2012  . GERD (gastroesophageal reflux disease)   . Hypertension   . Impaired fasting glucose 04/19/2013  . Peripheral vascular disease (HCC)    CHRONIC VENOUS INSUFFICIENCY  . Shortness of breath   . 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 Stroke ([place])   . Wears dentures     Patient Active Problem List   Diagnosis Date Noted  . Cellulitis 07/31/2019  . Left arm pain 07/30/2019  . AKI (acute kidney injury) ([place]) 07/30/2019  . Normocytic anemia 07/30/2019  . Right anterior knee pain   . Hematoma of leg, right, subsequent encounter   . Hypernatremia   . Aspiration into airway 06/08/2019  . Aspiration pneumonia ([place])   . Dysphagia due to recent cerebrovascular accident 05/18/2019  . Tobacco use disorder 05/18/2019  . Chronic pain syndrome 05/18/2019  . Stroke ([place]) R MCA emb d/t AF not on [facility] s/p partial revascularization w/ IR 04/30/2019  . Middle cerebral artery embolism, right 04/30/2019  . Chronic atrial fibrillation ([place]) 08/26/2015  . Hyperlipidemia LDL goal <100 06/26/2014  . Routine general medical examination at a health care facility 11/21/2013  . Screening PSA (prostate specific antigen) 11/21/2013  . Special screening for malignant neoplasms, colon 11/21/2013  . OSA (obstructive sleep apnea) 08/22/2013  . Morbid obesity ([place]) 04/19/2013  . Other emphysema ([place]) 01/31/2013  . Essential hypertension 12/21/2012  . Insomnia 12/21/2012  . GERD (gastroesophageal reflux disease) 09/20/2012  . Back pain, chronic 09/20/2012  . Obesity hypoventilation syndrome ([place]) 09/20/2012  . Adjustment disorder  with mixed anxiety and depressed mood 09/20/2012    Past Surgical History:  Procedure Laterality Date  . ESOPHAGOGASTRODUODENOSCOPY  05/18/2011   Procedure: ESOPHAGOGASTRODUODENOSCOPY (EGD);  Surgeon: Shann Medal, MD;  Location: [facility];  Service: Endoscopy;  Laterality: N/A;  . FOOT SURGERY  2003   right foot surgery from work accident   . GASTRIC BYPASS  10/19/11  . GASTRIC RESTRICTION SURGERY  1992  . IR ANGIO VERTEBRAL SEL SUBCLAVIAN INNOMINATE UNI R MOD SED  04/30/2019  . IR CT HEAD LTD  04/30/2019  .  IR GASTROSTOMY TUBE REMOVAL  08/07/2019  . IR PERCUTANEOUS ART THROMBECTOMY/INFUSION INTRACRANIAL INC DIAG ANGIO  04/30/2019  . IR REPLC DUODEN/JEJUNO TUBE PERCUT W/FLUORO  06/05/2019  . LAPAROSCOPIC INSERTION GASTROSTOMY TUBE N/A 05/16/2019   Procedure: LAPAROSCOPIC JEJUNOSTOMY TUBE;  Surgeon: Ralene Ok, MD;  Location: [facility];  Service: General;  Laterality: N/A;  . LEG SURGERY  1998   calcium deposit removed on right  leg   . RADIOLOGY WITH ANESTHESIA N/A 04/30/2019   Procedure: CODE STROKE;  Surgeon: Radiologist, Medication, MD;  Location: [facility];  Service: Radiology;  Laterality: N/A;    Prior to Admission medications   Medication Sig Start Date End Date Taking? Authorizing Provider  albuterol (PROVENTIL HFA;VENTOLIN HFA) 108 (90 Base) MCG/ACT inhaler Inhale 2 puffs into the lungs every 4 (four) hours as needed for wheezing or shortness of breath. 08/30/15   Gildardo Cranker, DO  ALPRAZolam Duanne Moron) 0.5 MG tablet Place 1 tablet (0.5 mg total) into feeding tube 3 (three) times daily. 06/23/19   Eugenie Filler, MD  ALPRAZolam Duanne Moron) 1 MG tablet Take 1 tablet (1 mg total) by mouth 2 (two) times daily as needed for anxiety (in addition to scheduled doses for panic attacks and worse symptoms). 06/23/19   Eugenie Filler, MD  Amino Acids-Protein Hydrolys (FEEDING SUPPLEMENT, PRO-STAT SUGAR FREE 64,) LIQD Place 30 mLs into feeding tube 3 (three) times daily. 05/19/19   Donzetta Starch, NP  apixaban (ELIQUIS) 5 MG TABS tablet Take 1 tablet (5 mg total) by mouth 2 (two) times daily. 08/08/19   Enzo Bi, MD  cyclobenzaprine (FLEXERIL) 10 MG tablet Place 1 tablet (10 mg total) into feeding tube 3 (three) times daily as needed for muscle spasms. 06/10/19   Donnamae Jude, MD  diphenhydrAMINE (BENADRYL) 25 mg capsule Take 25 mg by mouth every 4 (four) hours as needed for itching or allergies.    [provider]  gabapentin (NEURONTIN) 250 MG/5ML solution Place 12 mLs (600 mg total) into feeding  tube 4 (four) times daily. 05/19/19   Donzetta Starch, NP  metoprolol tartrate (LOPRESSOR) 25 mg/10 mL SUSP Place 5 mLs (12.5 mg total) into feeding tube 2 (two) times daily. 05/23/19   Donzetta Starch, NP  Nutritional Supplements (FEEDING SUPPLEMENT, JEVITY 1.2 CAL,) LIQD Place 1,000 mLs into feeding tube continuous. 05/19/19   Donzetta Starch, NP  nystatin (MYCOSTATIN) 100000 UNIT/ML suspension Take 1 mL by mouth 3 (three) times daily. (swab to tongue, cheeks and top of mouth)    [provider]  oxyCODONE (ROXICODONE) 5 MG/5ML solution Place 7.5 mLs (7.5 mg total) into feeding tube every 6 (six) hours as needed for moderate pain or severe pain. 08/08/19   Enzo Bi, MD  pantoprazole sodium (PROTONIX) 40 mg/20 mL PACK Place 20 mLs (40 mg total) into feeding tube daily. 05/19/19   Donzetta Starch, NP  sertraline (ZOLOFT) 20 MG/ML concentrated solution Place 1 mL (  20 mg total) into feeding tube daily. 08/08/19   Enzo Bi, MD  simethicone (MYLICON) 80 MG chewable tablet Place 2 tablets (160 mg total) into feeding tube 4 (four) times daily for 4 days. 06/23/19 06/27/19  Eugenie Filler, MD  Water For Irrigation, Sterile (FREE WATER) SOLN Place 200 mLs into feeding tube every 6 (six) hours. 06/23/19   Eugenie Filler, MD  rivaroxaban (XARELTO) 20 MG TABS tablet Take 1 tablet (20 mg total) by mouth daily with supper. For blood clot prevention. Patient not taking: Reported on 10/03/2018 08/24/18 01/21/19  Pleas Koch, NP    Allergies Vioxx [rofecoxib]  Family History  Problem Relation Age of Onset  . Cancer Mother        melanoma  . Heart disease Mother   . Rheum arthritis Mother   . Lung cancer Father        melanoma  . Heart disease Father   . Heart disease Sister     Social History Social History   Tobacco Use  . Smoking status: Former Smoker    Packs/day: 0.50    Years: 40.00    Pack years: 20.00    Types: Cigarettes  . Smokeless tobacco: Never Used  Substance Use Topics  .  Alcohol use: Not Currently    Alcohol/week: 0.0 standard drinks    Comment: RARELY  . Drug use: No    Review of Systems Level 5 caveat: Review of systems limited due to dysarthria Constitutional: Positive for generalized weakness. Cardiovascular: Denies chest pain. Respiratory: Denies shortness of breath. Gastrointestinal: No vomiting. Musculoskeletal: Positive for bilateral leg pain. Skin: Negative for rash. Neurological: Negative for headache.   ____________________________________________   PHYSICAL EXAM:  VITAL SIGNS: ED Triage Vitals [10/27/19 1919]  Enc Vitals Group     BP      Pulse      Resp      Temp      Temp src      SpO2      Weight (!) 351 lb (159.2 kg)     Height      Head Circumference      [facility] Flow      Pain Score 0     Pain Loc      Pain Edu?      Excl. in [place]?     Constitutional: Alert and oriented.  Very weak and debilitated appearing, in no acute distress. Eyes: Conjunctivae are normal.  EOMI.  PERRLA. Head: Atraumatic. Nose: No congestion/rhinnorhea. Mouth/Throat: Mucous membranes are dry.  Gurgling sounds coming from the throat.  Oropharynx clear. Neck: Normal range of motion.  Cardiovascular: Normal rate, regular rhythm. Grossly normal heart sounds.  Good peripheral circulation. Respiratory: Normal respiratory effort.  No retractions. Lungs CTAB.   Gastrointestinal: Soft and nontender. No distention.  Genitourinary: No flank tenderness. Musculoskeletal: Chronic venous stasis changes bilaterally.  Skin tear to right forearm.   Neurologic:  Normal speech and language.  Generalized weakness, motor intact to bilateral lower extremities. Skin:  Skin is warm and dry. No rash noted. Psychiatric: Calm and cooperative.  ____________________________________________   LABS (all labs ordered are listed, but only abnormal results are displayed)  Labs Reviewed  LACTIC ACID, PLASMA - Abnormal; Notable for the following components:      Result  Value   Lactic Acid, Venous 2.0 (*)    All other components within normal limits  URINALYSIS, COMPLETE (UACMP) WITH MICROSCOPIC - Abnormal; Notable for the following components:   Color,  Urine YELLOW (*)    APPearance HAZY (*)    Hgb urine dipstick LARGE (*)    Protein, ur 30 (*)    Nitrite POSITIVE (*)    Leukocytes,Ua SMALL (*)    Bacteria, UA FEW (*)    All other components within normal limits  SARS CORONAVIRUS 2 BY RT PCR (HOSPITAL ORDER, [place] [facility])  COMPREHENSIVE METABOLIC PANEL  CBC WITH DIFFERENTIAL/PLATELET  LACTIC ACID, PLASMA  BRAIN NATRIURETIC PEPTIDE  CK  TROPONIN I (HIGH SENSITIVITY)   ____________________________________________  EKG  ED ECG REPORT I, Arta Silence, the attending physician, personally viewed and interpreted this ECG.  Date: 10/27/2019 EKG Time: 1934 Rate: 80 Rhythm: Atrial fibrillation with PVCs QRS Axis: normal Intervals: Nonspecific IVCD ST/T Wave abnormalities: normal Narrative Interpretation: Nonspecific abnormalities with no evidence of acute ischemia ____________________________________________  RADIOLOGY  CXR: Possible mild edema, no focal infiltrate XR pelvis: No evidence of acute fracture XR R forearm: No acute fracture CT head: No ICH or other acute abnormality  ____________________________________________   PROCEDURES  Procedure(s) performed: No  Procedures  Critical Care performed: No ____________________________________________   INITIAL IMPRESSION / ASSESSMENT AND PLAN / ED COURSE  Pertinent labs & imaging results that were available during my care of the patient were reviewed by me and considered in my medical decision making (see chart for details).  64 year old male with PMH as noted above including CVA and recent admission for arm cellulitis presents with generalized weakness and inability to ambulate.  He was picked up by a close friend for a 1 week respite from his  long-term care at [facility], who found him much weaker than the facility told her.  He was brought to the hospital after he was unable to get out of bed or eat and drink on his own for more than a day.  I contacted the friend, Amy, who corroborated the above history.  She states that she last saw him in November.  She reports that she was told he was ambulatory and able to complete ADLs.  I reviewed the past medical records in epic.  The patient was most recently admitted in February with cellulitis to the left arm.  Previously he was admitted for a stroke late last year and subsequently readmitted in January due to aspiration after he had signed himself out from the hospital AMA and tried to eat.  The patient has a PEG tube due to difficulty swallowing.  On exam, the patient is overall weak and debilitated appearing.  He is able to move all extremities, but not able to move around in the bed or ambulate.  His vital signs are normal except for O2 saturation in the low 90s on room air.  He has some gurgling sounds from his throat, likely related to his chronic dysphagia.  His airway is intact at this time.  Mucous membranes are dry. He has superficial skin tears to the right forearm from an attempt to move him yesterday.  He has no other visible trauma.    Overall I suspect that the patient's weakness is chronic, however given that he has not had anything to eat or drink in the last 2 days and was in a car and at a hotel unable to get sufficient assistance with ADLs, I have concern for dehydration, other metabolic derangements, and rhabdomyolysis.  We will also obtain a broader work-up including CT head, chest x-ray, labs including cardiac enzymes, and urinalysis to evaluate for other etiologies of  acute weakness.  The patient has expressed a desire to be transferred to [facility] in [place].  Amy lives in [place] and he wants to be close to her, but per what she told me he has no existing  relationship with a doctor there.  I explained that we cannot transfer him directly from the ED until we have initially worked him up and stabilized him, and even then transferred be at the discretion of the receiving facility since there is no medical indication for it.  We will obtain initial work-up here and then decide on the further plan.  ----------------------------------------- 11:19 PM on 10/27/2019 -----------------------------------------  Imaging and lab work-up is unremarkable for acute findings except for an initial borderline lactate.  This is normalized after a small fluid bolus.  Urinalysis shows findings suggestive of possible UTI, however the patient has no fever, leukocytosis, or other evidence of significant infection or sepsis.  I have ordered p.o. Keflex.  Overall I suspect that the patient's current state is not an acute change, and that he has become chronically weak over months.  At this time, the patient does not meet inpatient admission criteria.  Therefore there is also no indication to transfer him to another hospital.  However, he will need placement back at an appropriate facility.  When I went to update the patient about the results of the work-up, he states that he does not want to be here all night and demands to be discharged back to the hotel.  Given that the patient cannot ambulate or perform ADLs even with assistance, it would be completely unsafe to discharge him back to a hotel.  I explained to the patient that I cannot safely discharge him to a hotel in his current state.  I attempted to contact the friend Amy who I spoke to earlier, however she is currently not picking up the phone.  I have ordered social work and PT evaluations for the morning, and I signed the patient out to the oncoming physician Dr. Ellender Hose. ____________________________________________   FINAL CLINICAL IMPRESSION(S) / ED DIAGNOSES  Final diagnoses:  Weakness      NEW MEDICATIONS  STARTED DURING THIS VISIT:  New Prescriptions   No medications on file     Note:  This document was prepared using Dragon voice recognition software and may include unintentional dictation errors.   Arta Silence, MD 10/27/19 [phone]

## 2019-10-27 NOTE — ED Triage Notes (Signed)
Pt is brought in by [facility] from [facility] in [place]. Pt left [facility] yesterday with is friend/finace Amy McKibben [phone].  Pt was at [facility] due to a stroke and per paper I am given his friend picked him up yesterday for a "one week leave from [facility] from 5/13-5/19.  She told ems that [facility] told her that he could walk and eat solid foods but when she got to the motel where they were going to spend the night before heading to [facility] today he was unable to get out of the car and they had to contact ems to help him into the motel room.  Today it became clear that he would not be able to ambulate and she spoke with Family physician Dr.  Arther Abbott [phone] and he advised her to call ems and get him to a hospital for stabilization.  Per note from her he is wanting to go to [place] to [facility] and he has not had any food or drink in over 24 hours.  Pt is alert and speaking with staff, he is difficult to understand.

## 2019-10-27 NOTE — ED Notes (Signed)
Pt taken to CT at this time.

## 2019-10-27 NOTE — ED Notes (Signed)
Pt back from CT. Pt asked to please notify staff when able to urinate. MD notified

## 2019-10-28 NOTE — ED Notes (Signed)
Per Belenda Cruise with speech/swallow, pt is a strict PEG tube user

## 2019-10-28 NOTE — Progress Notes (Signed)
SLP Cancellation Note  Patient Details Name: Kyle [name] MRN: [id number] DOB: [birth year]   Cancelled treatment:       Reason Eval/Treat Not Completed: Patient not medically ready;Medical issues which prohibited therapy(chart reviewed; consulted NSG caring for pt in ED).  Per chart and ST notes, pt has a BASELINE dx of Severe-Profound oropharyngeal phase Dysphagia per recent admission at [facility] in 06/2019. Per chart notes then, pt was seen at [facility] during the months of 04/2019, 05/2019, and 06/2019 and recommended NPO status s/p MBSS and Full PEG support for nutrition/hydration needs for reasons of -- "Severe to Profound oral and pharyngeal phase deficits that persist s/p prior CVA". A repeat MBSS in March 2021 revealed "profound oropharyngeal dysphagia characterized by minimal lingual movement, poor lip closure, and inability to effect posterior transfer of any consistency tested (thin, nectar-thick, and applesauce). Recommend continuing NPO status with full PEG support for nutrition/hydration".  Noted in ED notes this admission: "Previously he was admitted for a stroke late last year and subsequently readmitted in January due to aspiration after he had signed himself out from the hospital AMA and tried to eat. The patient has a PEG tube due to difficulty swallowing.". He is recommended to continue f/u at the SNF w/ dysphagia therapy but full PEG support for nutrition/hydration/meds d/t risk for aspiration thus Pulmonary decline.    Orinda Kenner, [place], CCC-SLP Aiyla Baucom 10/28/2019, 11:32 AM

## 2019-10-28 NOTE — ED Notes (Signed)
Kyle [name] at [employer] given report

## 2019-10-28 NOTE — Evaluation (Signed)
Physical Therapy Evaluation Patient Details Name: Kyle [name] MRN: [id number] DOB: 1956-01-24 Today's Date: 10/28/2019   History of Present Illness  64 y.o. male with a PMH of atrial fibrillation, hypertension, COPD, obesity, and CVA with right MCA infarct who presents for generalized weakness.  The patient has been at [facility] for long-term care ever since a stroke last year.  Apparently he was going to be staying with fiance ~1 week and then return to facility but was unable to do any walking, eating, etc and is now in the ED needing return to facility  Clinical Impression  Pt with severe dysphasia and is extremely difficult to understand, he is able to answer questions with writing and showed good awareness and comprehension with all cues.  He seems to indicate that he had been able to walk >100 ft consistently at rehab but today needed heavy assist for all mobility and could not manage to move either foot even an inch standing at EOB this date.  He showed surprising coordination and strength with in bed mobility, use of phone, assisting with donning boxers, etc but needed max assist and heavy cuing/set up just to attain standing.  Pt will need to return to facility that can give 24/7 assist, unable/unsafe to do any walking, etc today.     Follow Up Recommendations SNF    Equipment Recommendations  None recommended by PT    Recommendations for Other Services       Precautions / Restrictions Precautions Precautions: Fall Restrictions Weight Bearing Restrictions: No      Mobility  Bed Mobility Overal bed mobility: Needs Assistance Bed Mobility: Supine to Sit;Sit to Supine     Supine to sit: Mod assist;Max assist Sit to supine: Max assist   General bed mobility comments: Pt was able to give some assist with mobility transitions, but ultimately needed considerable assist to get to/from sitting  Transfers Overall transfer level: Needs assistance Equipment used: Rolling  walker (2 wheeled) Transfers: Sit to/from Stand Sit to Stand: Max assist         General transfer comment: Pt made great effort to rise w/o assist from PT but even with appropriate set up and cues for sequencing he did not rise w/o heavy assist.  (second attempt pt gave min assist and he was unable to lift hips off EOB  Ambulation/Gait             General Gait Details: unable, we stood for ~1 minute but pt unable to move either LE at all despite heavy walker/UE use, unweighting assist from PT and heavy cuing/encouragment to try to slide /legs feet back against bed  Stairs            Wheelchair Mobility    Modified Rankin (Stroke Patients Only)       Balance Overall balance assessment: Needs assistance Sitting-balance support: Bilateral upper extremity supported Sitting balance-Leahy Scale: Fair       Standing balance-Leahy Scale: Poor Standing balance comment: highly reliant on walker and needing hands on assist t/o the effort                             Pertinent Vitals/Pain Pain Assessment: No/denies pain    Home Living Family/patient expects to be discharged to:: [place]: [place] - 2 wheels;C[place] - single point  Prior Function Level of Independence: Needs assistance         Comments: Pt very difficult to understand but he seems to indicate that he was regularly walking >100 ft at the facility, per today's effort this may not be the case     Hand Dominance   Dominant Hand: Right    Extremity/Trunk Assessment   Upper Extremity Assessment Upper Extremity Assessment: Overall WFL for tasks assessed;Generalized weakness(able to use phone, help pull up boxers, )    Lower Extremity Assessment Lower Extremity Assessment: Generalized weakness       Communication   Communication: Expressive difficulties;Other (comment)(is able to communicate via writing)  Cognition  Arousal/Alertness: Awake/alert Behavior During Therapy: WFL for tasks assessed/performed;Restless Overall Cognitive Status: No family/caregiver present to determine baseline cognitive functioning                                 General Comments: Pt alert and interactive, just has very hard time with verbalization      General Comments      Exercises     Assessment/Plan    PT Assessment Patient needs continued PT services  PT Problem List Decreased strength;Decreased range of motion;Decreased activity tolerance;Decreased balance;Decreased mobility;Decreased coordination;Decreased safety awareness;Decreased knowledge of use of DME       PT Treatment Interventions DME instruction;Gait training;Functional mobility training;Therapeutic activities;Therapeutic exercise;Balance training;Neuromuscular re-education;Patient/family education;Wheelchair mobility training    PT Goals (Current goals can be found in the Care Plan section)  Acute Rehab PT Goals Patient Stated Goal: get out of the hospital PT Goal Formulation: With patient Time For Goal Achievement: 11/11/19 Potential to Achieve Goals: Good    Frequency Min 2X/week   Barriers to discharge        Co-evaluation               AM-PAC PT "6 Clicks" Mobility  Outcome Measure Help needed turning from your back to your side while in a flat bed without using bedrails?: A Lot Help needed moving from lying on your back to sitting on the side of a flat bed without using bedrails?: A Lot Help needed moving to and from a bed to a chair (including a wheelchair)?: Total Help needed standing up from a chair using your arms (e.g., wheelchair or bedside chair)?: Total Help needed to walk in hospital room?: Total Help needed climbing 3-5 steps with a railing? : Total 6 Click Score: 8    End of Session Equipment Utilized During Treatment: Gait belt Activity Tolerance: Patient tolerated treatment well;Patient limited by  fatigue Patient left: in bed;with call bell/phone within reach Nurse Communication: Mobility status PT Visit Diagnosis: Muscle weakness (generalized) (M62.81);Difficulty in walking, not elsewhere classified (R26.2);Unsteadiness on feet (R26.81)    Time: [id number] PT Time Calculation (min) (ACUTE ONLY): 26 min   Charges:   PT Evaluation $PT Eval Low Complexity: 1 Low PT Treatments $Therapeutic Activity: 8-22 mins        Kreg Shropshire, DPT 10/28/2019, 1:30 PM

## 2019-10-28 NOTE — ED Notes (Signed)
This note is not being shared with the patient for the following reason: To respect privacy (The patient or proxy has requested that the information not be shared). Pt's primary care physician was contacted in regards to getting pt his daily medications by Ali Lowe, [place] and primary care doctor was unaware that pt was at [facility] or that he was leaving [facility] but states that if fiance called him on Monday that he would be willing to write them prescriptions for pt's daily medications

## 2019-10-28 NOTE — ED Notes (Signed)
PT at bedside.

## 2019-10-28 NOTE — ED Notes (Signed)
Dr Corky Downs informed of pt having PEG tube and pt not having appropriate diet order placed

## 2019-10-28 NOTE — ED Notes (Signed)
This note is not being shared with the patient for the following reason: To respect privacy (The patient or proxy has requested that the information not be shared).Pt fiance to bedside and is refusing to have pt transferred back to [facility] by EMS- pt fiance states she would like to get him in her car and take him to [place] was contacted and Dr Jimmye Norman was made aware of this plan and states that she can assume care of pt to take him to [facility]

## 2019-10-28 NOTE — Social Work (Addendum)
TOC CM/SW receive social work consult.  Social work consult in progress.  Pt from [facility].     1359: Social worker called [facility].  Social worker spoke to [place], Veterinary surgeon. [facility] staff expecting patient to return to facility when he is discharge.  Karsten Fells will be waiting for the report.   1005:Waiting for call back from [facility] administrators.  Berenice Bouton, MSW, LCSW  [phone] 8am-6pm (weekends)

## 2019-10-28 NOTE — ED Notes (Signed)
Pt unable to sign for discharge d/t previous stroke

## 2020-05-24 ENCOUNTER — Inpatient Hospital Stay (HOSPITAL_COMMUNITY)
Admission: EM | Admit: 2020-05-24 | Discharge: 2020-05-28 | DRG: 313 | Disposition: A | Discharge status: Home / Self Care | Payer: Medicare PPO | Attending: Family Medicine | Admitting: Family Medicine

## 2020-05-24 ENCOUNTER — Encounter (HOSPITAL_COMMUNITY): Payer: Self-pay

## 2020-05-24 ENCOUNTER — Emergency Department (HOSPITAL_COMMUNITY): Payer: Medicare PPO

## 2020-05-24 DIAGNOSIS — I69322 Dysarthria following cerebral infarction: Secondary | ICD-10-CM

## 2020-05-24 DIAGNOSIS — Z808 Family history of malignant neoplasm of other organs or systems: Secondary | ICD-10-CM

## 2020-05-24 DIAGNOSIS — Z6831 Body mass index (BMI) 31.0-31.9, adult: Secondary | ICD-10-CM

## 2020-05-24 DIAGNOSIS — Z5900 Homelessness unspecified: Secondary | ICD-10-CM

## 2020-05-24 DIAGNOSIS — I10 Essential (primary) hypertension: Secondary | ICD-10-CM | POA: Diagnosis present

## 2020-05-24 DIAGNOSIS — Z801 Family history of malignant neoplasm of trachea, bronchus and lung: Secondary | ICD-10-CM

## 2020-05-24 DIAGNOSIS — Z8261 Family history of arthritis: Secondary | ICD-10-CM

## 2020-05-24 DIAGNOSIS — Z87891 Personal history of nicotine dependence: Secondary | ICD-10-CM

## 2020-05-24 DIAGNOSIS — Z23 Encounter for immunization: Secondary | ICD-10-CM

## 2020-05-24 DIAGNOSIS — E669 Obesity, unspecified: Secondary | ICD-10-CM | POA: Diagnosis present

## 2020-05-24 DIAGNOSIS — I69392 Facial weakness following cerebral infarction: Secondary | ICD-10-CM

## 2020-05-24 DIAGNOSIS — J438 Other emphysema: Secondary | ICD-10-CM | POA: Diagnosis present

## 2020-05-24 DIAGNOSIS — Z79899 Other long term (current) drug therapy: Secondary | ICD-10-CM

## 2020-05-24 DIAGNOSIS — G894 Chronic pain syndrome: Secondary | ICD-10-CM | POA: Diagnosis present

## 2020-05-24 DIAGNOSIS — T43226A Underdosing of selective serotonin reuptake inhibitors, initial encounter: Secondary | ICD-10-CM | POA: Diagnosis present

## 2020-05-24 DIAGNOSIS — Z8249 Family history of ischemic heart disease and other diseases of the circulatory system: Secondary | ICD-10-CM

## 2020-05-24 DIAGNOSIS — F4323 Adjustment disorder with mixed anxiety and depressed mood: Secondary | ICD-10-CM | POA: Diagnosis present

## 2020-05-24 DIAGNOSIS — Z91138 Patient's unintentional underdosing of medication regimen for other reason: Secondary | ICD-10-CM

## 2020-05-24 DIAGNOSIS — K219 Gastro-esophageal reflux disease without esophagitis: Secondary | ICD-10-CM | POA: Diagnosis present

## 2020-05-24 DIAGNOSIS — T424X6A Underdosing of benzodiazepines, initial encounter: Secondary | ICD-10-CM | POA: Diagnosis present

## 2020-05-24 DIAGNOSIS — I482 Chronic atrial fibrillation, unspecified: Secondary | ICD-10-CM | POA: Diagnosis present

## 2020-05-24 DIAGNOSIS — Z7901 Long term (current) use of anticoagulants: Secondary | ICD-10-CM

## 2020-05-24 DIAGNOSIS — Z888 Allergy status to other drugs, medicaments and biological substances status: Secondary | ICD-10-CM

## 2020-05-24 DIAGNOSIS — Z20822 Contact with and (suspected) exposure to covid-19: Secondary | ICD-10-CM | POA: Diagnosis present

## 2020-05-24 DIAGNOSIS — I6601 Occlusion and stenosis of right middle cerebral artery: Secondary | ICD-10-CM | POA: Diagnosis present

## 2020-05-24 DIAGNOSIS — R0789 Other chest pain: Principal | ICD-10-CM | POA: Diagnosis present

## 2020-05-24 DIAGNOSIS — R079 Chest pain, unspecified: Secondary | ICD-10-CM | POA: Diagnosis not present

## 2020-05-24 DIAGNOSIS — T447X6A Underdosing of beta-adrenoreceptor antagonists, initial encounter: Secondary | ICD-10-CM | POA: Diagnosis present

## 2020-05-24 DIAGNOSIS — T471X6A Underdosing of other antacids and anti-gastric-secretion drugs, initial encounter: Secondary | ICD-10-CM | POA: Diagnosis present

## 2020-05-24 DIAGNOSIS — T45516A Underdosing of anticoagulants, initial encounter: Secondary | ICD-10-CM | POA: Diagnosis present

## 2020-05-24 LAB — RESP PANEL BY RT-PCR (FLU A&B, COVID) ARPGX2
Influenza A by PCR: NEGATIVE
Influenza B by PCR: NEGATIVE
SARS Coronavirus 2 by RT PCR: NEGATIVE

## 2020-05-24 LAB — CBC
HCT: 45.1 % (ref 39.0–52.0)
Hemoglobin: 14.9 g/dL (ref 13.0–17.0)
MCH: 30.2 pg (ref 26.0–34.0)
MCHC: 33 g/dL (ref 30.0–36.0)
MCV: 91.3 fL (ref 80.0–100.0)
Platelets: 218 10*3/uL (ref 150–400)
RBC: 4.94 MIL/uL (ref 4.22–5.81)
RDW: 13.4 % (ref 11.5–15.5)
WBC: 8.3 10*3/uL (ref 4.0–10.5)
nRBC: 0 % (ref 0.0–0.2)

## 2020-05-24 LAB — BASIC METABOLIC PANEL
Anion gap: 11 (ref 5–15)
BUN: 11 mg/dL (ref 8–23)
CO2: 26 mmol/L (ref 22–32)
Calcium: 8.8 mg/dL — ABNORMAL LOW (ref 8.9–10.3)
Chloride: 101 mmol/L (ref 98–111)
Creatinine, Ser: 0.75 mg/dL (ref 0.61–1.24)
GFR, Estimated: >60 mL/min (ref >60)
Glucose, Bld: 89 mg/dL (ref 70–99)
Potassium: 3.8 mmol/L (ref 3.5–5.1)
Sodium: 138 mmol/L (ref 135–145)

## 2020-05-24 LAB — TROPONIN I (HIGH SENSITIVITY)
Troponin I (High Sensitivity): 6 ng/L (ref <18)
Troponin I (High Sensitivity): 6 ng/L (ref <18)

## 2020-05-24 IMAGING — DX DG CHEST 1V PORT
1 series · 1 of 1 positions shown · non-contrast
Comparison: [DATE]

CLINICAL DATA: Chest pain today.

EXAM:
PORTABLE CHEST 1 VIEW

[chest ap grid]
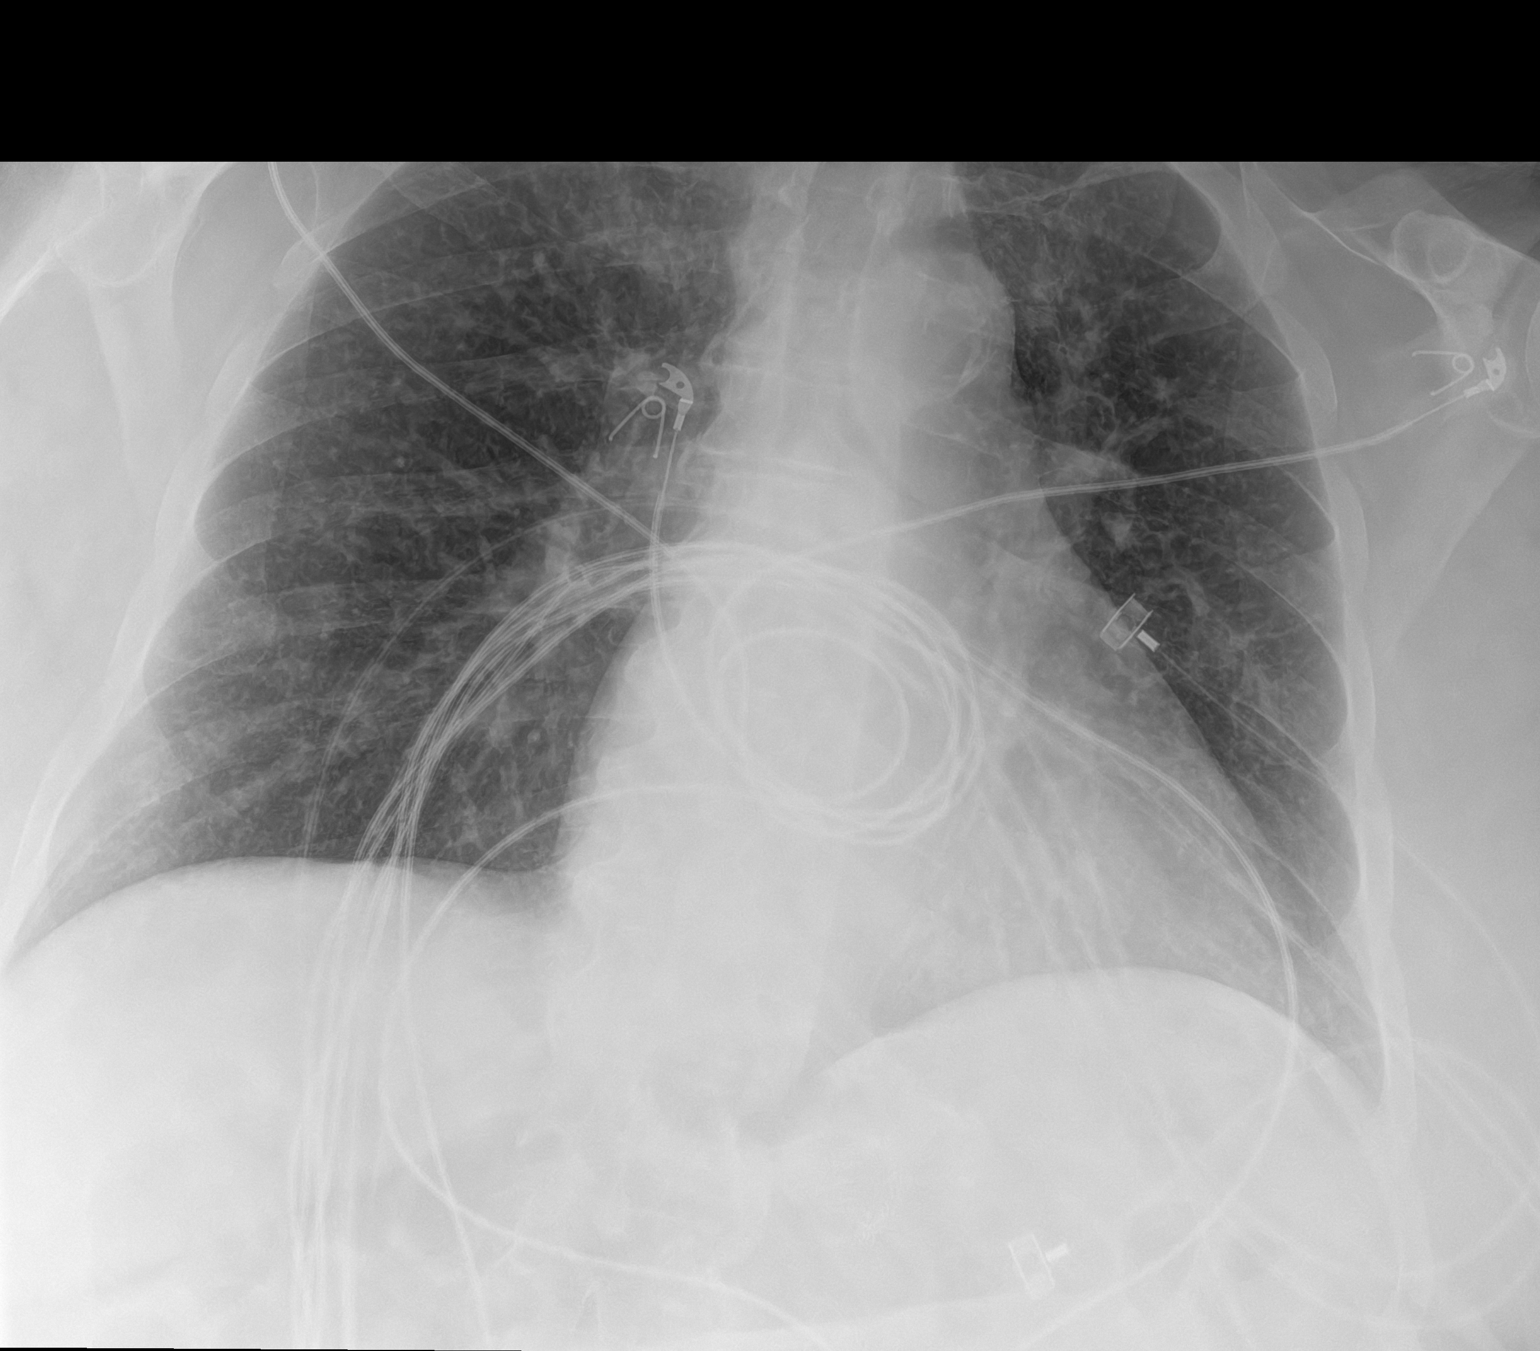

[1 of 1 positions shown; findings below may reference images not displayed]

FINDINGS: Cardiopericardial silhouette is borderline enlarged. No mediastinal
or hilar masses.

Prominent lung markings, stable.  Lungs otherwise clear.

No pleural effusion or pneumothorax.

Skeletal structures are grossly intact.
IMPRESSION: No acute cardiopulmonary disease.

## 2020-05-24 MED ORDER — OXYCODONE-ACETAMINOPHEN 5-325 MG PO TABS
1.0000 | ORAL_TABLET | Freq: Once | ORAL | Status: AC
  Administered 2020-05-24: 1 via ORAL
  Filled 2020-05-24: qty 1

## 2020-05-24 MED ORDER — MORPHINE SULFATE (PF) 4 MG/ML IV SOLN
4.0000 mg | Freq: Once | INTRAVENOUS | Status: AC
Start: 2020-05-24 — End: 2020-05-24
  Administered 2020-05-24: 4 mg via INTRAVENOUS
  Filled 2020-05-24: qty 1

## 2020-05-24 MED ORDER — ONDANSETRON HCL 4 MG/2ML IJ SOLN: 4.0000 mg | Freq: Four times a day (QID) | INTRAMUSCULAR | Status: DC | PRN

## 2020-05-24 MED ORDER — METOPROLOL TARTRATE 25 MG/10 ML ORAL SUSPENSION
12.5000 mg | Freq: Two times a day (BID) | ORAL | Status: DC
  Filled 2020-05-24 (×6): qty 5

## 2020-05-24 MED ORDER — ACETAMINOPHEN 325 MG PO TABS
650.0000 mg | ORAL_TABLET | ORAL | Status: DC | PRN
  Administered 2020-05-25: 650 mg via ORAL
  Filled 2020-05-24: qty 2

## 2020-05-24 MED ORDER — PANTOPRAZOLE SODIUM 40 MG PO PACK
40.0000 mg | PACK | Freq: Every day | ORAL | Status: DC
  Administered 2020-05-24 – 2020-05-26 (×2): 40 mg via ORAL
  Filled 2020-05-24 (×6): qty 20

## 2020-05-24 MED ORDER — APIXABAN 5 MG PO TABS
5.0000 mg | ORAL_TABLET | Freq: Two times a day (BID) | ORAL | Status: DC
  Administered 2020-05-24 – 2020-05-28 (×8): 5 mg via ORAL
  Filled 2020-05-24 (×8): qty 1

## 2020-05-24 MED ORDER — NITROGLYCERIN 0.4 MG SL SUBL: 0.4000 mg | SUBLINGUAL_TABLET | SUBLINGUAL | Status: DC | PRN

## 2020-05-24 NOTE — ED Provider Notes (Signed)
[facility] EMERGENCY DEPARTMENT Provider Note   CSN: [id number] Arrival date & time: 05/24/20  8341     History Chief Complaint  Patient presents with  . Chest Pain    Kyle [name] is a 64 y.o. male with pertinent past medical history of A. fib on Eliquis, hypertension, COPD, obesity, CVA with right MCA infarct on Xarelto and Eliquis that presents emergency department today for chest pain.  He is extremely hard to understand due to his dysarthria, only able to get conversation out of him with nods and some mumbles.  Patient states that he was recently at SNF, however was discharged and supposed to go to another assisted living facility but went to a motel instead.  Has been at the motel for 2 days, states that while he has been there he has been lethargic eating or drinking anything.  States that this morning he started having chest pain.  States that it radiates to his left shoulder, feels like a stabbing sensation.  Also admits to some shortness of breath, unsure if this is new.  Denies any nausea or vomiting.  Denies any diaphoresis.  Also admits to a cough, nonproductive.  He is unsure when this started, he thinks a couple days ago. .  States that he was supposed to go to another facility, however ended up with a motel.  Does not elaborate more about this.  Has not been taking his medications including his blood thinners for the past 2 days.  Denies any fevers, chills, new weakness, new facial droop.  Patient denies any abdominal pain.  Unknown Covid status.  Patient states that chest pain has continued and has been persistent while being here.  Per chart review patient has had echo done last year, did not find any record of catheterization or stress test.  HPI     Past Medical History:  Diagnosis Date  . Arthritis   . Atrial fibrillation    HX OF SICK SINUS SYNDROME-ATRIAL FIB  . Back pain, chronic    PT STATES 5 BULGING DISKS AND PINCHED NERVES--PT ON OXYCODONE 4 TIMES A DAY  FOR HIS PAIN  . Chest pain 08/26/2015  . Chronic airway obstruction, not elsewhere classified 12/21/2012  . GERD (gastroesophageal reflux disease)   . Hypertension   . Impaired fasting glucose 04/19/2013  . Peripheral vascular disease (HCC)    CHRONIC VENOUS INSUFFICIENCY  . Shortness of breath   . 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 Stroke ([place])   . Wears dentures     Patient Active Problem List   Diagnosis Date Noted  . Cellulitis 07/31/2019  . Left arm pain 07/30/2019  . AKI (acute kidney injury) ([place]) 07/30/2019  . Normocytic anemia 07/30/2019  . Right anterior knee pain   . Hematoma of leg, right, subsequent encounter   . Hypernatremia   . Aspiration into airway 06/08/2019  . Aspiration pneumonia ([place])   . Dysphagia due to recent cerebrovascular accident 05/18/2019  . Tobacco use disorder 05/18/2019  . Chronic pain syndrome 05/18/2019  . Stroke ([place]) R MCA emb d/t AF not on [facility] s/p partial revascularization w/ IR 04/30/2019  . Middle cerebral artery embolism, right 04/30/2019  . Chronic atrial fibrillation ([place]) 08/26/2015  . Hyperlipidemia LDL goal <100 06/26/2014  . Routine general medical examination at a health care facility 11/21/2013  . Screening PSA (prostate specific antigen) 11/21/2013  .  Special screening for malignant neoplasms, colon 11/21/2013  . OSA (obstructive sleep apnea) 08/22/2013  . Morbid obesity ([place]) 04/19/2013  . Other emphysema ([place]) 01/31/2013  . Essential hypertension 12/21/2012  . Insomnia 12/21/2012  . GERD (gastroesophageal reflux disease) 09/20/2012  . Back pain, chronic 09/20/2012  . Obesity hypoventilation syndrome ([place]) 09/20/2012  . Adjustment disorder with mixed anxiety and depressed mood 09/20/2012    Past Surgical History:  Procedure Laterality Date  . ESOPHAGOGASTRODUODENOSCOPY  05/18/2011   Procedure: ESOPHAGOGASTRODUODENOSCOPY (EGD);  Surgeon: Shann Medal, MD;  Location: [facility];  Service: Endoscopy;  Laterality: N/A;  . FOOT SURGERY  2003   right foot surgery from work accident   . GASTRIC BYPASS  10/19/11  . GASTRIC RESTRICTION SURGERY  1992  . IR ANGIO VERTEBRAL SEL SUBCLAVIAN INNOMINATE UNI R MOD SED  04/30/2019  . IR CT HEAD LTD  04/30/2019  . IR GASTROSTOMY TUBE REMOVAL  08/07/2019  . IR PERCUTANEOUS ART THROMBECTOMY/INFUSION INTRACRANIAL INC DIAG ANGIO  04/30/2019  . IR REPLC DUODEN/JEJUNO TUBE PERCUT W/FLUORO  06/05/2019  . LAPAROSCOPIC INSERTION GASTROSTOMY TUBE N/A 05/16/2019   Procedure: LAPAROSCOPIC JEJUNOSTOMY TUBE;  Surgeon: Ralene Ok, MD;  Location: [facility];  Service: General;  Laterality: N/A;  . LEG SURGERY  1998   calcium deposit removed on right  leg   . RADIOLOGY WITH ANESTHESIA N/A 04/30/2019   Procedure: CODE STROKE;  Surgeon: Radiologist, Medication, MD;  Location: [facility];  Service: Radiology;  Laterality: N/A;       Family History  Problem Relation Age of Onset  . Cancer Mother        melanoma  . Heart disease Mother   . Rheum arthritis Mother   . Lung cancer Father        melanoma  . Heart disease Father   . Heart disease Sister     Social History   Tobacco Use  . Smoking status: Former Smoker    Packs/day: 0.50    Years: 40.00    Pack years: 20.00    Types: Cigarettes  . Smokeless tobacco: Never Used  Vaping Use  . Vaping Use: Never used  Substance Use Topics  . Alcohol use: Not Currently    Alcohol/week: 0.0 standard drinks    Comment: RARELY  . Drug use: No    Home Medications Prior to Admission medications   Medication Sig Start Date End Date Taking? Authorizing Provider  acetaminophen (TYLENOL) 650 MG CR tablet Take 650 mg by mouth 3 (three) times daily.    [provider]  albuterol (PROVENTIL HFA;VENTOLIN HFA) 108 (90 Base) MCG/ACT inhaler Inhale 2 puffs into the lungs every 4 (four) hours as needed for wheezing or shortness of breath. 08/30/15   Gildardo Cranker, DO  ALPRAZolam Duanne Moron) 0.5 MG tablet Place 1 tablet (0.5 mg total) into feeding tube 3 (three) times daily. Patient taking differently: Take 0.5 mg by mouth 3 (three) times daily. Can also have twice daily if needed 06/23/19   Eugenie Filler, MD  Amino Acids-Protein Hydrolys (FEEDING SUPPLEMENT, PRO-STAT SUGAR FREE 64,) LIQD Place 30 mLs into feeding tube 3 (three) times daily. Patient taking differently: Take 30 mLs by mouth 3 (three) times daily.  05/19/19   Donzetta Starch, NP  apixaban (ELIQUIS) 5 MG TABS tablet Take 1 tablet (5 mg total) by mouth 2 (two) times daily. 08/08/19   Enzo Bi, MD  cyclobenzaprine (FLEXERIL) 10 MG tablet Place 1 tablet (10 mg total) into feeding tube 3 (  three) times daily as needed for muscle spasms. Patient taking differently: Take 10 mg by mouth 3 (three) times daily as needed for muscle spasms.  06/10/19   Donnamae Jude, MD  diphenhydrAMINE (BENADRYL) 25 mg capsule Take 25 mg by mouth every 4 (four) hours as needed for itching or allergies.    [provider]  metoprolol tartrate (LOPRESSOR) 25 mg/10 mL SUSP Place 5 mLs (12.5 mg total) into feeding tube 2 (two) times daily. Patient taking differently: Take 25 mg by mouth 2 (two) times daily.  05/23/19   Donzetta Starch, NP  oxyCODONE (ROXICODONE) 5 MG/5ML solution Place 7.5 mLs (7.5 mg total) into feeding tube every 6 (six) hours as needed for moderate pain or severe pain. Patient taking differently: Take 5 mg by mouth every 4 (four) hours as needed for moderate pain or severe pain.  08/08/19   Enzo Bi, MD  pantoprazole sodium (PROTONIX) 40 mg/20 mL PACK Place 20 mLs (40 mg total) into feeding tube daily. Patient taking differently: Take 40 mg by mouth daily.  05/19/19   Donzetta Starch, NP  sertraline (ZOLOFT) 20 MG/ML concentrated solution Place 1 mL (20 mg total) into feeding tube daily. Patient taking differently: Take 25 mg by mouth daily.  08/08/19   Enzo Bi, MD  rivaroxaban (XARELTO) 20 MG  TABS tablet Take 1 tablet (20 mg total) by mouth daily with supper. For blood clot prevention. Patient not taking: Reported on 10/03/2018 08/24/18 01/21/19  Pleas Koch, NP    Allergies    Vioxx [rofecoxib]  Review of Systems   Review of Systems  Constitutional: Negative for chills, diaphoresis, fatigue and fever.  HENT: Negative for congestion, sore throat and trouble swallowing.   Eyes: Negative for pain and visual disturbance.  Respiratory: Positive for cough and shortness of breath. Negative for wheezing.   Cardiovascular: Positive for chest pain. Negative for palpitations and leg swelling.  Gastrointestinal: Negative for abdominal distention, abdominal pain, diarrhea, nausea and vomiting.  Genitourinary: Negative for difficulty urinating.  Musculoskeletal: Negative for back pain, neck pain and neck stiffness.  Skin: Negative for pallor.  Neurological: Positive for weakness. Negative for dizziness, speech difficulty and headaches.  Psychiatric/Behavioral: Negative for confusion.    Physical Exam Updated Vital Signs BP 133/83   Pulse 75   Temp 99.8 F (37.7 C) (Rectal)   Resp 18   SpO2 98%   Physical Exam Constitutional:      General: He is not in acute distress.    Appearance: Normal appearance. He is ill-appearing (Chronically ill-appearing). He is not toxic-appearing or diaphoretic.     Comments: Patient is a chronically ill-appearing 64 year old, appears older than stated age.  No respiratory distress.    HENT:     Mouth/Throat:     Mouth: Mucous membranes are moist.     Pharynx: Oropharynx is clear.  Eyes:     General: No scleral icterus.    Extraocular Movements: Extraocular movements intact.     Pupils: Pupils are equal, round, and reactive to light.  Cardiovascular:     Rate and Rhythm: Normal rate and regular rhythm.     Pulses: Normal pulses.     Heart sounds: Normal heart sounds.  Pulmonary:     Effort: Pulmonary effort is normal. No respiratory  distress.     Breath sounds: Normal breath sounds. No stridor. No wheezing, rhonchi or rales.  Chest:     Chest wall: No tenderness.  Abdominal:     General:  Abdomen is flat. There is no distension.     Palpations: Abdomen is soft.     Tenderness: There is no abdominal tenderness. There is no guarding or rebound.  Musculoskeletal:        General: No swelling or tenderness. Normal range of motion.     Cervical back: Normal range of motion and neck supple. No rigidity.     Right lower leg: No edema.     Left lower leg: No edema.  Skin:    General: Skin is warm and dry.     Capillary Refill: Capillary refill takes less than 2 seconds.     Coloration: Skin is not pale.  Neurological:     Mental Status: He is alert and oriented to person, place, and time. Mental status is at baseline.     Comments: Patient with left-sided neuro deficits from prior stroke, including left-sided facial droop.  No new focal deficits.  Patient with dysarthria from prior stroke, very hard to understand but is able to follow commands and is alert and oriented x3.  Is able to move all 4 extremities with normal strength.  Psychiatric:        Mood and Affect: Mood normal.        Behavior: Behavior normal.     ED Results / Procedures / Treatments   Labs (all labs ordered are listed, but only abnormal results are displayed) Labs Reviewed  BASIC METABOLIC PANEL - Abnormal; Notable for the following components:      Result Value   Calcium 8.8 (*)    All other components within normal limits  RESP PANEL BY RT-PCR (FLU A&B, COVID) [id number]  CBC  TROPONIN I (HIGH SENSITIVITY)  TROPONIN I (HIGH SENSITIVITY)    EKG EKG Interpretation  Date/Time:  Friday May 24 2020 10:16:11 EST Ventricular Rate:  83 PR Interval:    QRS Duration: 108 QT Interval:  411 QTC Calculation: 483 R Axis:   144 Text Interpretation: Atrial fibrillation Low voltage, extremity leads Borderline prolonged QT interval Confirmed by  Fredia Sorrow [phone]) on 05/24/2020 10:18:33 AM   Radiology DG Chest Port 1 View  Result Date: 05/24/2020 CLINICAL DATA:  Chest pain today. EXAM: PORTABLE CHEST 1 VIEW COMPARISON:  04/24/2020 FINDINGS: Cardiopericardial silhouette is borderline enlarged. No mediastinal or hilar masses. Prominent lung markings, stable.  Lungs otherwise clear. No pleural effusion or pneumothorax. Skeletal structures are grossly intact. IMPRESSION: No acute cardiopulmonary disease. Electronically Signed   By: Lajean Manes M.D.   On: 05/24/2020 11:03    Procedures Procedures (including critical care time)  Medications Ordered in ED Medications  nitroGLYCERIN (NITROSTAT) SL tablet 0.4 mg (has no administration in time range)  oxyCODONE-acetaminophen (PERCOCET/ROXICET) 5-325 MG per tablet 1 tablet (1 tablet Oral Given 05/24/20 1217)  morphine 4 MG/ML injection 4 mg (4 mg Intravenous Given 05/24/20 1630)    ED Course  I have reviewed the triage vital signs and the nursing notes.  Pertinent labs & imaging results that were available during my care of the patient were reviewed by me and considered in my medical decision making (see chart for details).    MDM Rules/Calculators/A&P                         Kyle [name] is a 65 y.o. male with pertinent past medical history of A. fib on Eliquis, hypertension, COPD, obesity, CVA with right MCA infarct on Xarelto and Eliquis that presents emergency department today for  chest pain.  Work-up today with unremarkable CBC and BMP.  Troponin VI, second troponin pending.  Chest x-ray interpreted by me without any acute cardiopulmonary disease.  EKG interpreted by me in A. fib, rate controlled.   Patient with heart score of 4, I do think patient would benefit from ACS rule out at this time since patient has never has no previous cath with chest pain, shortness of breath and extensive past medical history..  Patient also started having chest pain 2 days after stopping  blood thinners.  Patient is continuously having chest pain while he has been here for the past 6 hours. Morphine and Nitro ordered.    Spoke to Dr. Harl Bowie, cardiology who recommends ACS rule out and cardiology work-up at this time.  Admitted to [facility], hospitalist who will accept the patient. Second trop pending.    The patient appears reasonably stabilized for admission considering the current resources, flow, and capabilities available in the ED at this time, and I doubt any other [facility] requiring further screening and/or treatment in the ED prior to admission.  I discussed this case with my attending physician who cosigned this note including patient's presenting symptoms, physical exam, and planned diagnostics and interventions. Attending physician stated agreement with plan or made changes to plan which were implemented.   Attending physician assessed patient at bedside.  Final Clinical Impression(s) / ED Diagnoses Final diagnoses:  Chest pain, unspecified type    Rx / DC Orders ED Discharge Orders    None       Alfredia Client, PA-C 05/24/20 1659    Fredia Sorrow, MD 06/22/20 [phone]

## 2020-05-24 NOTE — ED Notes (Signed)
Ac advised, Lopressor oral suspension is unavailable to be admin at this time.

## 2020-05-24 NOTE — ED Triage Notes (Addendum)
Pt was picked up by cousin from a nursing home and dropped off at hotel and has been there for 2 days. Called out chest pain. Pt very difficult to understand. Pt possibly been at nursing home for possibly one year due to stroke

## 2020-05-24 NOTE — H&P (Signed)
TRH H&P   Patient Demographics:    Kyle [name], is a 64 y.o. male  MRN: [id number]   DOB - 1955-11-21  Admit Date - 05/24/2020  Outpatient Primary MD for the patient is [name], MD  Referring MD/NP/PA: PA Patel    Patient coming from: Was discharged 2 days ago from a nursing home, went to hotel, he came from motel, report he is currently homeless.  Chief Complaint  Patient presents with  . Chest Pain      HPI:    Kyle [name]  is a 64 y.o. male, with past medical history of A. fib, on Eliquis, hypertension, COPD, obesity, CVA with right MCA infarct, embolic, status post lumpectomy, on Eliquis, patient presents to ED today secondary for chest pain, patient with severe dysarthria, very difficult to understand, only L able to nod his head, and understands few words, apparently patient was recently at [facility] for few month, he was discharged and supposed to go to another assisted living facility, but she went to motel instead, he has been in the motel for 2 days, port he has been eating and drinking not much at the hotel, he does report intermittent chest pain, radiates to the left shoulder, reports stabbing sensation, will he does report some dyspnea, poor and vague historian, history is very unreliable, patient tells me he is currently homeless. -ED EKG was nonacute, and troponins were negative x2, initially he had reproducible chest pain on palpation, but this has resolved after receiving morphine, after discussion with Dr. Harl Bowie he recommended admission overnight for observation, and to obtain 2D echo in a.m., and if negative he can be discharged with outpatient follow-up.    Review of systems:    In addition to the HPI above,  No Fever-chills, No Headache, No changes with Vision or hearing, No problems swallowing food or Liquids, Does report chest pain, mild dyspnea, no  cough No Abdominal pain, No Nausea or Vommitting, Bowel movements are regular, No Blood in stool or Urine, No dysuria, No new skin rashes or bruises, No new joints pains-aches,  Does report generalized weakness, but no new focal deficits. No recent weight gain or loss, No polyuria, polydypsia or polyphagia, No significant Mental Stressors.  A full 10 point Review of Systems was done, except as stated above, all other Review of Systems were negative.   With Past History of the following :    Past Medical History:  Diagnosis Date  . Arthritis   . Atrial fibrillation    HX OF SICK SINUS SYNDROME-ATRIAL FIB  . Back pain, chronic    PT STATES 5 BULGING DISKS AND PINCHED NERVES--PT ON OXYCODONE 4 TIMES A DAY FOR HIS PAIN  . Chest pain 08/26/2015  . Chronic airway obstruction, not elsewhere classified 12/21/2012  . GERD (gastroesophageal reflux disease)   . Hypertension   . Impaired fasting glucose 04/19/2013  . Peripheral  vascular disease ([place])    CHRONIC VENOUS INSUFFICIENCY  . Shortness of breath   . 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 Stroke ([place])   . Wears dentures       Past Surgical History:  Procedure Laterality Date  . ESOPHAGOGASTRODUODENOSCOPY  05/18/2011   Procedure: ESOPHAGOGASTRODUODENOSCOPY (EGD);  Surgeon: Shann Medal, MD;  Location: [facility];  Service: Endoscopy;  Laterality: N/A;  . FOOT SURGERY  2003   right foot surgery from work accident   . GASTRIC BYPASS  10/19/11  . GASTRIC RESTRICTION SURGERY  1992  . IR ANGIO VERTEBRAL SEL SUBCLAVIAN INNOMINATE UNI R MOD SED  04/30/2019  . IR CT HEAD LTD  04/30/2019  . IR GASTROSTOMY TUBE REMOVAL  08/07/2019  . IR PERCUTANEOUS ART THROMBECTOMY/INFUSION INTRACRANIAL INC DIAG ANGIO  04/30/2019  . IR REPLC DUODEN/JEJUNO TUBE PERCUT W/FLUORO  06/05/2019  . LAPAROSCOPIC INSERTION GASTROSTOMY TUBE N/A 05/16/2019   Procedure: LAPAROSCOPIC  JEJUNOSTOMY TUBE;  Surgeon: Ralene Ok, MD;  Location: [facility];  Service: General;  Laterality: N/A;  . LEG SURGERY  1998   calcium deposit removed on right  leg   . RADIOLOGY WITH ANESTHESIA N/A 04/30/2019   Procedure: CODE STROKE;  Surgeon: Radiologist, Medication, MD;  Location: [facility];  Service: Radiology;  Laterality: N/A;      Social History:     Social History   Tobacco Use  . Smoking status: Former Smoker    Packs/day: 0.50    Years: 40.00    Pack years: 20.00    Types: Cigarettes  . Smokeless tobacco: Never Used  Substance Use Topics  . Alcohol use: Not Currently    Alcohol/week: 0.0 standard drinks    Comment: RARELY       Family History :     Family History  Problem Relation Age of Onset  . Cancer Mother        melanoma  . Heart disease Mother   . Rheum arthritis Mother   . Lung cancer Father        melanoma  . Heart disease Father   . Heart disease Sister      Home Medications:   Prior to Admission medications   Medication Sig Start Date End Date Taking? Authorizing Provider  acetaminophen (TYLENOL) 650 MG CR tablet Take 650 mg by mouth 3 (three) times daily.    [provider]  albuterol (PROVENTIL HFA;VENTOLIN HFA) 108 (90 Base) MCG/ACT inhaler Inhale 2 puffs into the lungs every 4 (four) hours as needed for wheezing or shortness of breath. 08/30/15   Gildardo Cranker, DO  ALPRAZolam Duanne Moron) 0.5 MG tablet Place 1 tablet (0.5 mg total) into feeding tube 3 (three) times daily. Patient taking differently: Take 0.5 mg by mouth 3 (three) times daily. Can also have twice daily if needed 06/23/19   Eugenie Filler, MD  Amino Acids-Protein Hydrolys (FEEDING SUPPLEMENT, PRO-STAT SUGAR FREE 64,) LIQD Place 30 mLs into feeding tube 3 (three) times daily. Patient taking differently: Take 30 mLs by mouth 3 (three) times daily.  05/19/19   Donzetta Starch, NP  apixaban (ELIQUIS) 5 MG TABS tablet Take 1 tablet (5 mg total) by mouth 2 (two) times daily.  08/08/19   Enzo Bi, MD  cyclobenzaprine (FLEXERIL) 10 MG tablet Place 1 tablet (10 mg total) into feeding tube 3 (three) times daily as needed for muscle spasms. Patient taking differently:  Take 10 mg by mouth 3 (three) times daily as needed for muscle spasms.  06/10/19   Donnamae Jude, MD  diphenhydrAMINE (BENADRYL) 25 mg capsule Take 25 mg by mouth every 4 (four) hours as needed for itching or allergies.    [provider]  metoprolol tartrate (LOPRESSOR) 25 mg/10 mL SUSP Place 5 mLs (12.5 mg total) into feeding tube 2 (two) times daily. Patient taking differently: Take 25 mg by mouth 2 (two) times daily.  05/23/19   Donzetta Starch, NP  oxyCODONE (ROXICODONE) 5 MG/5ML solution Place 7.5 mLs (7.5 mg total) into feeding tube every 6 (six) hours as needed for moderate pain or severe pain. Patient taking differently: Take 5 mg by mouth every 4 (four) hours as needed for moderate pain or severe pain.  08/08/19   Enzo Bi, MD  pantoprazole sodium (PROTONIX) 40 mg/20 mL PACK Place 20 mLs (40 mg total) into feeding tube daily. Patient taking differently: Take 40 mg by mouth daily.  05/19/19   Donzetta Starch, NP  sertraline (ZOLOFT) 20 MG/ML concentrated solution Place 1 mL (20 mg total) into feeding tube daily. Patient taking differently: Take 25 mg by mouth daily.  08/08/19   Enzo Bi, MD  rivaroxaban (XARELTO) 20 MG TABS tablet Take 1 tablet (20 mg total) by mouth daily with supper. For blood clot prevention. Patient not taking: Reported on 10/03/2018 08/24/18 01/21/19  Pleas Koch, NP     Allergies:     Allergies  Allergen Reactions  . Vioxx [Rofecoxib] Rash     Physical Exam:   Vitals  Blood pressure 134/77, pulse 73, temperature 99.8 F (37.7 C), temperature source Rectal, resp. rate 20, SpO2 96 %.   1. General developed male, laying in bed, no apparent distress  2. Normal affect and insight, Not Suicidal or Homicidal, Awake Alert, Oriented X 3.  3.  Patient with  significant dysarthria .  4. Ears and Eyes appear Normal, Conjunctivae clear, PERRLA. Moist Oral Mucosa.  5. Supple Neck, No JVD, No cervical lymphadenopathy appriciated, No Carotid Bruits.  6. Symmetrical Chest wall movement, Good air movement bilaterally, CTAB.  7. RRR, No Gallops, Rubs or Murmurs, No Parasternal Heave.  8. Positive Bowel Sounds, Abdomen Soft, No tenderness, No organomegaly appriciated,No rebound -guarding or rigidity.  9.  No Cyanosis, Normal Skin Turgor, No Skin Rash or Bruise.  10. Good muscle tone,  joints appear normal , no effusions, Normal ROM.  11. No Palpable Lymph Nodes in Neck or Axillae     Data Review:    CBC Recent Labs  Lab 05/24/20 1103  WBC 8.3  HGB 14.9  HCT 45.1  PLT 218  MCV 91.3  MCH 30.2  MCHC 33.0  RDW 13.4   ------------------------------------------------------------------------------------------------------------------  Chemistries  Recent Labs  Lab 05/24/20 1103  NA 138  K 3.8  CL 101  CO2 26  GLUCOSE 89  BUN 11  CREATININE 0.75  CALCIUM 8.8*   ------------------------------------------------------------------------------------------------------------------ CrCl cannot be calculated (Unknown ideal weight.). ------------------------------------------------------------------------------------------------------------------ No results for input(s): TSH, T4TOTAL, T3FREE, THYROIDAB in the last 72 hours.  Invalid input(s): FREET3  Coagulation profile No results for input(s): INR, PROTIME in the last 168 hours. ------------------------------------------------------------------------------------------------------------------- No results for input(s): DDIMER in the last 72 hours. -------------------------------------------------------------------------------------------------------------------  Cardiac Enzymes No results for input(s): CKMB, TROPONINI, MYOGLOBIN in the last 168 hours.  Invalid input(s):  CK ------------------------------------------------------------------------------------------------------------------    Component Value Date/Time   BNP 46.0 10/27/2019 0925     ---------------------------------------------------------------------------------------------------------------  Urinalysis    Component Value Date/Time   COLORURINE YELLOW (A) 10/27/2019 2211   APPEARANCEUR HAZY (A) 10/27/2019 2211   LABSPEC 1.018 10/27/2019 2211   PHURINE 5.0 10/27/2019 2211   GLUCOSEU NEGATIVE 10/27/2019 2211   HGBUR LARGE (A) 10/27/2019 2211   BILIRUBINUR NEGATIVE 10/27/2019 2211   KETONESUR NEGATIVE 10/27/2019 2211   PROTEINUR 30 (A) 10/27/2019 2211   NITRITE POSITIVE (A) 10/27/2019 2211   LEUKOCYTESUR SMALL (A) 10/27/2019 2211    ----------------------------------------------------------------------------------------------------------------   Imaging Results:    DG Chest Port 1 View  Result Date: 05/24/2020 CLINICAL DATA:  Chest pain today. EXAM: PORTABLE CHEST 1 VIEW COMPARISON:  04/24/2020 FINDINGS: Cardiopericardial silhouette is borderline enlarged. No mediastinal or hilar masses. Prominent lung markings, stable.  Lungs otherwise clear. No pleural effusion or pneumothorax. Skeletal structures are grossly intact. IMPRESSION: No acute cardiopulmonary disease. Electronically Signed   By: Lajean Manes M.D.   On: 05/24/2020 11:03      Assessment & Plan:    Active Problems:   GERD (gastroesophageal reflux disease)   Adjustment disorder with mixed anxiety and depressed mood   Essential hypertension   Chronic atrial fibrillation (HCC)   Middle cerebral artery embolism, right   Chronic pain syndrome   Chest pain  Chest pain -Has some nontypical features, as it was reproducible by palpation, discussed with cardiology Dr. Harl Bowie, did review the EKG, report nothing acute, he recommended to observe overnight, cycle troponins and obtain 2D echo, if work-up is negative then  further work-up can be done as an outpatient. -High-sensitivity troponins negative x2, will plan for 2D echo in a.m..  A. fib -Continue with Eliquis and metoprolol.  History of CVA -This was secondary to embolic source, s/p mechanical thrombectomy. -Used to have history of dysphagia with PEG tube, this all is improved, he is tolerating regular diet now.  Hypertension -Continue with metoprolol  Homelessness -Patient reports he is currently homeless, apparently he was picked up from SNF by his cousin who left him in a hotel for couple days, will consult social worker   DVT Prophylaxis on Eliquis  AM Labs Ordered, also please review Full Orders  Family Communication: Admission, patients condition and plan of care including tests being ordered have been discussed with the patient , I have tried to call sister at [phone] phone number, with no answer.  Who indicate understanding and agree with the plan and Code Status.  Code Status Full  Likely DC to  Pending social worker evaluation.  Condition GUARDED    Consults called: none    Admission status: observation    Time spent in minutes : 50 minutes   Phillips Climes M.D on 05/24/2020 at 6:56 PM   [facility] - Office  [phone]

## 2020-05-25 ENCOUNTER — Other Ambulatory Visit: Payer: Self-pay

## 2020-05-25 ENCOUNTER — Encounter (HOSPITAL_COMMUNITY): Payer: Self-pay | Admitting: Internal Medicine

## 2020-05-25 ENCOUNTER — Observation Stay (HOSPITAL_BASED_OUTPATIENT_CLINIC_OR_DEPARTMENT_OTHER): Payer: Medicare PPO

## 2020-05-25 DIAGNOSIS — R079 Chest pain, unspecified: Secondary | ICD-10-CM

## 2020-05-25 DIAGNOSIS — I482 Chronic atrial fibrillation, unspecified: Secondary | ICD-10-CM | POA: Diagnosis not present

## 2020-05-25 DIAGNOSIS — I361 Nonrheumatic tricuspid (valve) insufficiency: Secondary | ICD-10-CM | POA: Diagnosis not present

## 2020-05-25 DIAGNOSIS — F4323 Adjustment disorder with mixed anxiety and depressed mood: Secondary | ICD-10-CM

## 2020-05-25 DIAGNOSIS — I340 Nonrheumatic mitral (valve) insufficiency: Secondary | ICD-10-CM | POA: Diagnosis not present

## 2020-05-25 DIAGNOSIS — K219 Gastro-esophageal reflux disease without esophagitis: Secondary | ICD-10-CM

## 2020-05-25 DIAGNOSIS — I10 Essential (primary) hypertension: Secondary | ICD-10-CM | POA: Diagnosis not present

## 2020-05-25 DIAGNOSIS — I6601 Occlusion and stenosis of right middle cerebral artery: Secondary | ICD-10-CM

## 2020-05-25 DIAGNOSIS — G894 Chronic pain syndrome: Secondary | ICD-10-CM

## 2020-05-25 LAB — HIV ANTIBODY (ROUTINE TESTING W REFLEX): HIV Screen 4th Generation wRfx: NONREACTIVE

## 2020-05-25 LAB — ECHOCARDIOGRAM COMPLETE
Area-P 1/2: 3.21 cm2
S' Lateral: 2.91 cm

## 2020-05-25 MED ORDER — METOPROLOL TARTRATE 25 MG PO TABS
12.5000 mg | ORAL_TABLET | Freq: Two times a day (BID) | ORAL | Status: DC
  Administered 2020-05-25 – 2020-05-28 (×7): 12.5 mg via ORAL
  Filled 2020-05-25 (×7): qty 1

## 2020-05-25 MED ORDER — INFLUENZA VAC SPLIT QUAD 0.5 ML IM SUSY
0.5000 mL | PREFILLED_SYRINGE | INTRAMUSCULAR | Status: AC
  Administered 2020-05-26: 08:00:00 0.5 mL via INTRAMUSCULAR
  Filled 2020-05-25: qty 0.5

## 2020-05-25 MED ORDER — ALPRAZOLAM 0.25 MG PO TABS
0.2500 mg | ORAL_TABLET | Freq: Three times a day (TID) | ORAL | Status: DC | PRN
  Administered 2020-05-25 – 2020-05-27 (×6): 0.25 mg via ORAL
  Filled 2020-05-25 (×7): qty 1

## 2020-05-25 MED ORDER — OXYCODONE HCL 5 MG PO TABS
5.0000 mg | ORAL_TABLET | ORAL | Status: DC | PRN
  Administered 2020-05-25 – 2020-05-27 (×9): 5 mg via ORAL
  Filled 2020-05-25 (×9): qty 1

## 2020-05-25 MED ORDER — PNEUMOCOCCAL VAC POLYVALENT 25 MCG/0.5ML IJ INJ
0.5000 mL | INJECTION | INTRAMUSCULAR | Status: AC
  Administered 2020-05-26: 08:00:00 0.5 mL via INTRAMUSCULAR
  Filled 2020-05-25: qty 0.5

## 2020-05-25 NOTE — TOC Initial Note (Signed)
Transition of Care (TOC) - Initial/Assessment Note   Patient Details  Name: Kyle [name] MRN: [id number] Date of Birth: 09/06/1955  Transition of Care (TOC) CM/SW Contact:    Megan S Glenn, LCSW Phone Number: 05/25/2020, 3:52 PM  Clinical Narrative: Patient is a 64 year old male who is under observation for adjustment disorder with mixed anxiety and depressed mood. Patient has history of GERD, hypertension, chronic atrial fibrillation, right middle cerebral artery embolism, and chronic pain syndrome. PT evaluation recommended SNF. CSW met with patient to discuss SNF and he is agreeable. Patient also wants to be faxed out in [place].  FL2 completed and PASRR verified. CSW called [employer] to start insurance authorization. Reference ID is: [id number]. Clinicals faxed to [employer]. TOC awaiting bed offers.  Expected Discharge Plan: Skilled Nursing Facility Barriers to Discharge: Continued Medical Work up  Patient Goals and CMS Choice Patient states their goals for this hospitalization and ongoing recovery are:: Go to rehab CMS Medicare.gov Compare Post Acute Care list provided to:: Patient Choice offered to / list presented to : Patient  Expected Discharge Plan and Services Expected Discharge Plan: Skilled Nursing Facility In-house Referral: Clinical Social Work Discharge Planning Services: NA Post Acute Care Choice: Skilled Nursing Facility Living arrangements for the past 2 months: Homeless              DME Arranged: N/A DME Agency: NA HH Arranged: NA HH Agency: NA  Prior Living Arrangements/Services Living arrangements for the past 2 months: Homeless Patient language and need for interpreter reviewed:: Yes Do you feel safe going back to the place where you live?: No   Patient is homeless  Need for Family Participation in Patient Care: No (Comment) Care giver support system in place?: No (comment) Criminal Activity/Legal Involvement Pertinent to Current  Situation/Hospitalization: No - Comment as needed  Permission Sought/Granted Permission sought to share information with : Facility Contact Representative Permission granted to share information with : Yes, Verbal Permission Granted Permission granted to share info w AGENCY: SNFs  Emotional Assessment Appearance:: Disheveled Attitude/Demeanor/Rapport: Engaged Affect (typically observed): Agitated Orientation: : Oriented to Self,Oriented to Place,Oriented to  Time,Oriented to Situation Alcohol / Substance Use: Not Applicable Psych Involvement: No (comment)  Admission diagnosis:  Chest pain [R07.9] Patient Active Problem List   Diagnosis Date Noted  . Chest pain 05/24/2020  . Cellulitis 07/31/2019  . Left arm pain 07/30/2019  . AKI (acute kidney injury) (HCC) 07/30/2019  . Normocytic anemia 07/30/2019  . Right anterior knee pain   . Hematoma of leg, right, subsequent encounter   . Hypernatremia   . Aspiration into airway 06/08/2019  . Aspiration pneumonia (HCC)   . Dysphagia due to recent cerebrovascular accident 05/18/2019  . Tobacco use disorder 05/18/2019  . Chronic pain syndrome 05/18/2019  . Stroke (HCC) R MCA emb d/t AF not on AC s/p partial revascularization w/ IR 04/30/2019  . Middle cerebral artery embolism, right 04/30/2019  . Chronic atrial fibrillation (HCC) 08/26/2015  . Hyperlipidemia LDL goal <100 06/26/2014  . Routine general medical examination at a health care facility 11/21/2013  . Screening PSA (prostate specific antigen) 11/21/2013  . Special screening for malignant neoplasms, colon 11/21/2013  . OSA (obstructive sleep apnea) 08/22/2013  . Morbid obesity (HCC) 04/19/2013  . Other emphysema (HCC) 01/31/2013  . Essential hypertension 12/21/2012  . Insomnia 12/21/2012  . GERD (gastroesophageal reflux disease) 09/20/2012  . Back pain, chronic 09/20/2012  . Obesity hypoventilation syndrome (HCC) 09/20/2012  . Adjustment disorder with mixed anxiety   and  depressed mood 09/20/2012   PCP:  [name], MD Pharmacy:   [facility], [place] - [address] [place] [address] [place] [address] Phone: [phone] Fax: [phone]  Readmission Risk Interventions Readmission Risk Prevention Plan 08/08/2019  Transportation Screening Complete  Medication Review (RN Care Manager) Referral to Pharmacy  PCP or Specialist appointment within 3-5 days of discharge Complete  Skilled [place] Complete  Some recent data might be hidden

## 2020-05-25 NOTE — ED Notes (Signed)
ED TO INPATIENT HANDOFF REPORT  ED Nurse Name and Phone #: Angelica Pou [phone]  S Name/Age/Gender Kyle [name] 64 y.o. male Room/Bed: APA07/APA07  Code Status   Code Status: Full Code  Home/SNF/Other Skilled nursing facility (Pt d/ced from SNF x2 days ago, has been living in [place])  Patient oriented to: self, place, time and situation Is this baseline? Yes   Triage Complete: Triage complete  Chief Complaint Chest pain [R07.9]  Triage Note Pt was picked up by cousin from a nursing home and dropped off at hotel and has been there for 2 days. Called out chest pain. Pt very difficult to understand. Pt possibly been at nursing home for possibly one year due to stroke    Allergies Allergies  Allergen Reactions  . Vioxx [Rofecoxib] Rash    Level of Care/Admitting Diagnosis ED Disposition    ED Disposition Condition [facility] Area: [facility] [[id number]]  Level of Care: Telemetry [5]  Covid Evaluation: Asymptomatic Screening Protocol (No Symptoms)  Diagnosis: Chest pain [301601]  Admitting Physician: Manfred Shirts  Attending Physician: Waldron Labs, DAWOOD S [[id number]]       B Medical/Surgery History Past Medical History:  Diagnosis Date  . Arthritis   . Atrial fibrillation    HX OF SICK SINUS SYNDROME-ATRIAL FIB  . Back pain, chronic    PT STATES 5 BULGING DISKS AND PINCHED NERVES--PT ON OXYCODONE 4 TIMES A DAY FOR HIS PAIN  . Chest pain 08/26/2015  . Chronic airway obstruction, not elsewhere classified 12/21/2012  . GERD (gastroesophageal reflux disease)   . Hypertension   . Impaired fasting glucose 04/19/2013  . Peripheral vascular disease (HCC)    CHRONIC VENOUS INSUFFICIENCY  . Shortness of breath   . 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 Stroke ([place])   . Wears dentures    Past Surgical History:  Procedure Laterality Date  .  ESOPHAGOGASTRODUODENOSCOPY  05/18/2011   Procedure: ESOPHAGOGASTRODUODENOSCOPY (EGD);  Surgeon: Shann Medal, MD;  Location[facility];  Service: Endoscopy;  Laterality: N/A;  . FOOT SURGERY  2003   right foot surgery from work accident   . GASTRIC BYPASS  10/19/11  . GASTRIC RESTRICTION SURGERY  1992  . IR ANGIO VERTEBRAL SEL SUBCLAVIAN INNOMINATE UNI R MOD SED  04/30/2019  . IR CT HEAD LTD  04/30/2019  . IR GASTROSTOMY TUBE REMOVAL  08/07/2019  . IR PERCUTANEOUS ART THROMBECTOMY/INFUSION INTRACRANIAL INC DIAG ANGIO  04/30/2019  . IR REPLC DUODEN/JEJUNO TUBE PERCUT W/FLUORO  06/05/2019  . LAPAROSCOPIC INSERTION GASTROSTOMY TUBE N/A 05/16/2019   Procedure: LAPAROSCOPIC JEJUNOSTOMY TUBE;  Surgeon: Ralene Ok, MD;  Location: [place];  Service: General;  Laterality: N/A;  . LEG SURGERY  1998   calcium deposit removed on right  leg   . RADIOLOGY WITH ANESTHESIA N/A 04/30/2019   Procedure: CODE STROKE;  Surgeon: Radiologist, Medication, MD;  Location: [place];  Service: Radiology;  Laterality: N/A;     A IV Location/Drains/Wounds Patient Lines/Drains/Airways Status    Active Line/Drains/Airways    Name Placement date Placement time Site Days   Peripheral IV 05/24/20 Left Antecubital 05/24/20  1059  Antecubital  1          Intake/Output Last 24 hours No intake or output data in the 24 hours ending 05/25/20 1533  Labs/Imaging Results for orders placed or performed during the hospital encounter of  05/24/20 (from the past 48 hour(s))  Basic metabolic panel     Status: Abnormal   Collection Time: 05/24/20 11:03 AM  Result Value Ref Range   Sodium 138 135 - 145 mmol/L   Potassium 3.8 3.5 - 5.1 mmol/L   Chloride 101 98 - 111 mmol/L   CO2 26 22 - 32 mmol/L   Glucose, Bld 89 70 - 99 mg/dL    Comment: Glucose reference range applies only to samples taken after fasting for at least 8 hours.   BUN 11 8 - 23 mg/dL   Creatinine, Ser 0.75 0.61 - 1.24 mg/dL   Calcium 8.8 (L) 8.9 - 10.3  mg/dL   GFR, Estimated >60 >60 mL/min    Comment: (NOTE) Calculated using the CKD-EPI Creatinine Equation (2021)    Anion gap 11 5 - 15    Comment: Performed at [facility], [address], [place], [place] [address]  CBC     Status: None   Collection Time: 05/24/20 11:03 AM  Result Value Ref Range   WBC 8.3 4.0 - 10.5 K/uL   RBC 4.94 4.22 - 5.81 MIL/uL   Hemoglobin 14.9 13.0 - 17.0 g/dL   HCT 45.1 39.0 - 52.0 %   MCV 91.3 80.0 - 100.0 fL   MCH 30.2 26.0 - 34.0 pg   MCHC 33.0 30.0 - 36.0 g/dL   RDW 13.4 11.5 - 15.5 %   Platelets 218 150 - 400 K/uL   nRBC 0.0 0.0 - 0.2 %    Comment: Performed at [facility], [address] [place], [place] [address]  Troponin I (High Sensitivity)     Status: None   Collection Time: 05/24/20 11:03 AM  Result Value Ref Range   Troponin I (High Sensitivity) 6 <18 ng/L    Comment: (NOTE) Elevated high sensitivity troponin I (hsTnI) values and significant  changes across serial measurements may suggest ACS but many other  chronic and acute conditions are known to elevate hsTnI results.  Refer to the "Links" section for chest pain algorithms and additional  guidance. Performed at [facility], [address] [place], [place] [address]   Resp Panel by RT-PCR (Flu A&B, Covid) Nasopharyngeal Swab     Status: None   Collection Time: 05/24/20 11:30 AM   Specimen: Nasopharyngeal Swab; Nasopharyngeal(NP) swabs in vial transport medium  Result Value Ref Range   SARS Coronavirus 2 by RT PCR NEGATIVE NEGATIVE    Comment: (NOTE) SARS-CoV-2 target nucleic acids are NOT DETECTED.  The SARS-CoV-2 RNA is generally detectable in upper respiratory specimens during the acute phase of infection. The lowest concentration of SARS-CoV-2 viral copies this assay can detect is 138 copies/mL. A negative result does not preclude SARS-Cov-2 infection and should not be used as the sole basis for treatment or other patient management decisions. A negative result may occur  with  improper specimen collection/handling, submission of specimen other than nasopharyngeal swab, presence of viral mutation(s) within the areas targeted by this assay, and inadequate number of viral copies(<138 copies/mL). A negative result must be combined with clinical observations, patient history, and epidemiological information. The expected result is Negative.  Fact Sheet for Patients:  EntrepreneurPulse.com.au  Fact Sheet for Healthcare Providers:  IncredibleEmployment.be  This test is no t yet approved or cleared by the [place] FDA and  has been authorized for detection and/or diagnosis of SARS-CoV-2 by FDA under an Emergency Use Authorization (EUA). This EUA will remain  in effect (meaning this test can be used)  for the duration of the COVID-19 declaration under Section 564(b)(1) of the Act, 21 U.S.C.section 360bbb-3(b)(1), unless the authorization is terminated  or revoked sooner.       Influenza A by PCR NEGATIVE NEGATIVE   Influenza B by PCR NEGATIVE NEGATIVE    Comment: (NOTE) The Xpert Xpress SARS-CoV-2/FLU/RSV plus assay is intended as an aid in the diagnosis of influenza from Nasopharyngeal swab specimens and should not be used as a sole basis for treatment. Nasal washings and aspirates are unacceptable for Xpert Xpress SARS-CoV-2/FLU/RSV testing.  Fact Sheet for Patients: EntrepreneurPulse.com.au  Fact Sheet for Healthcare Providers: IncredibleEmployment.be  This test is not yet approved or cleared by the [place] FDA and has been authorized for detection and/or diagnosis of SARS-CoV-2 by FDA under an Emergency Use Authorization (EUA). This EUA will remain in effect (meaning this test can be used) for the duration of the COVID-19 declaration under Section 564(b)(1) of the Act, 21 U.S.C. section 360bbb-3(b)(1), unless the authorization is terminated or revoked.  Performed  at [facility], [address], [place], [place] [address]   Troponin I (High Sensitivity)     Status: None   Collection Time: 05/24/20  4:10 PM  Result Value Ref Range   Troponin I (High Sensitivity) 6 <18 ng/L    Comment: (NOTE) Elevated high sensitivity troponin I (hsTnI) values and significant  changes across serial measurements may suggest ACS but many other  chronic and acute conditions are known to elevate hsTnI results.  Refer to the "Links" section for chest pain algorithms and additional  guidance. Performed at [facility], [address] [place], [place] [address]   HIV Antibody (routine testing w rflx)     Status: None   Collection Time: 05/24/20  9:18 PM  Result Value Ref Range   HIV Screen 4th Generation wRfx Non Reactive Non Reactive    Comment: Performed at [facility], [place] [address] [place], [place] [address]   DG Chest Port 1 View  Result Date: 05/24/2020 CLINICAL DATA:  Chest pain today. EXAM: PORTABLE CHEST 1 VIEW COMPARISON:  04/24/2020 FINDINGS: Cardiopericardial silhouette is borderline enlarged. No mediastinal or hilar masses. Prominent lung markings, stable.  Lungs otherwise clear. No pleural effusion or pneumothorax. Skeletal structures are grossly intact. IMPRESSION: No acute cardiopulmonary disease. Electronically Signed   By: Lajean Manes M.D.   On: 05/24/2020 11:03   ECHOCARDIOGRAM COMPLETE  Result Date: 05/25/2020    ECHOCARDIOGRAM REPORT   Patient Name:   Kyle [name] [facility] Date of Exam: 05/25/2020 Medical Rec #:  [id number]        Height:       77.0 in Accession #:    [id number]       Weight:       351.0 lb Date of Birth:  [birth year]         BSA:          2.841 m Patient Age:    41 years         BP:           135/73 mmHg Patient Gender: M                HR:           72 bpm. Exam Location:  [facility] Procedure: 2D Echo, Cardiac Doppler and Color Doppler Indications:    Chest pain  History:        Patient has prior history of Echocardiogram  examinations, most  recent 04/30/2019. Stroke, Arrythmias:Atrial Fibrillation,                 Signs/Symptoms:Chest Pain; Risk Factors:Hypertension, Former                 Smoker and Obesity.  Sonographer:    Dustin Flock RDCS Referring Phys: [id number] DAWOOD S Waldron Labs  Sonographer Comments: Image acquisition challenging due to COPD and Image acquisition challenging due to patient body habitus. IMPRESSIONS  1. There has been no change since the prior study on 04/30/2019.  2. Left ventricular ejection fraction, by estimation, is 60 to 65%. The left ventricle has normal function. The left ventricle has no regional wall motion abnormalities. Left ventricular diastolic parameters are consistent with Grade I diastolic dysfunction (impaired relaxation).  3. Right ventricular systolic function is normal. The right ventricular size is normal.  4. Left atrial size was mildly dilated.  5. Right atrial size was mildly dilated.  6. The mitral valve is normal in structure. Mild mitral valve regurgitation. No evidence of mitral stenosis. Moderate mitral annular calcification.  7. The aortic valve is normal in structure. There is mild calcification of the aortic valve. There is mild thickening of the aortic valve. Aortic valve regurgitation is not visualized. Mild to moderate aortic valve sclerosis/calcification is present, without any evidence of aortic stenosis.  8. The inferior vena cava is normal in size with greater than 50% respiratory variability, suggesting right atrial pressure of 3 mmHg. FINDINGS  Left Ventricle: Left ventricular ejection fraction, by estimation, is 60 to 65%. The left ventricle has normal function. The left ventricle has no regional wall motion abnormalities. The left ventricular internal cavity size was normal in size. There is  no left ventricular hypertrophy. Left ventricular diastolic parameters are consistent with Grade I diastolic dysfunction (impaired relaxation). Normal left  ventricular filling pressure. Right Ventricle: The right ventricular size is normal. No increase in right ventricular wall thickness. Right ventricular systolic function is normal. Left Atrium: Left atrial size was mildly dilated. Right Atrium: Right atrial size was mildly dilated. Pericardium: There is no evidence of pericardial effusion. Mitral Valve: The mitral valve is normal in structure. Moderate mitral annular calcification. Mild mitral valve regurgitation. No evidence of mitral valve stenosis. Tricuspid Valve: The tricuspid valve is normal in structure. Tricuspid valve regurgitation is mild . No evidence of tricuspid stenosis. Aortic Valve: The aortic valve is normal in structure. There is mild calcification of the aortic valve. There is mild thickening of the aortic valve. Aortic valve regurgitation is not visualized. Mild to moderate aortic valve sclerosis/calcification is present, without any evidence of aortic stenosis. Pulmonic Valve: The pulmonic valve was normal in structure. Pulmonic valve regurgitation is not visualized. No evidence of pulmonic stenosis. Aorta: The aortic root is normal in size and structure. Venous: The inferior vena cava is normal in size with greater than 50% respiratory variability, suggesting right atrial pressure of 3 mmHg. IAS/Shunts: No atrial level shunt detected by color flow Doppler.  LEFT VENTRICLE PLAX 2D LVIDd:         4.11 cm  Diastology LVIDs:         2.91 cm  LV e' medial:    9.46 cm/s LV PW:         1.36 cm  LV E/e' medial:  11.5 LV IVS:        1.16 cm  LV e' lateral:   12.10 cm/s LVOT diam:     2.10 cm  LV E/e' lateral: 9.0 LV  SV:         62 LV SV Index:   22 LVOT Area:     3.46 cm  RIGHT VENTRICLE RV Basal diam:  3.44 cm RV S prime:     12.70 cm/s TAPSE (M-mode): 3.1 cm LEFT ATRIUM             Index       RIGHT ATRIUM           Index LA diam:        3.90 cm 1.37 cm/m  RA Area:     22.60 cm LA Vol (A2C):   60.2 ml 21.19 ml/m RA Volume:   65.20 ml  22.95 ml/m  LA Vol (A4C):   59.8 ml 21.05 ml/m LA Biplane Vol: 60.8 ml 21.40 ml/m  AORTIC VALVE LVOT Vmax:   74.60 cm/s LVOT Vmean:  55.400 cm/s LVOT VTI:    0.178 m  AORTA Ao Root diam: 3.20 cm MITRAL VALVE MV Area (PHT): 3.21 cm     SHUNTS MV Decel Time: 236 msec     Systemic VTI:  0.18 m MV E velocity: 109.00 cm/s  Systemic Diam: 2.10 cm Ena Dawley MD Electronically signed by Ena Dawley MD Signature Date/Time: 05/25/2020/1:10:13 PM    Final     Pending Labs Unresulted Labs (From admission, onward)         None      Vitals/Pain Today's Vitals   05/25/20 1330 05/25/20 1400 05/25/20 1430 05/25/20 1530  BP: (!) 142/82 (!) 142/62 121/68 136/69  Pulse: 78 68 63 (!) 55  Resp: 19 19 15 14   Temp:      TempSrc:      SpO2: 96% 93% 95% 95%  PainSc:        Isolation Precautions No active isolations  Medications Medications  nitroGLYCERIN (NITROSTAT) SL tablet 0.4 mg (has no administration in time range)  acetaminophen (TYLENOL) tablet 650 mg (has no administration in time range)  ondansetron (ZOFRAN) injection 4 mg (has no administration in time range)  apixaban (ELIQUIS) tablet 5 mg (5 mg Oral Given 05/25/20 0944)  pantoprazole sodium (PROTONIX) 40 mg/20 mL oral suspension 40 mg (0 mg Oral Hold 05/25/20 1129)  metoprolol tartrate (LOPRESSOR) tablet 12.5 mg (12.5 mg Oral Given 05/25/20 0944)  oxyCODONE (Oxy IR/ROXICODONE) immediate release tablet 5 mg (5 mg Oral Given 05/25/20 1114)  ALPRAZolam (XANAX) tablet 0.25 mg (0.25 mg Oral Given 05/25/20 1422)  oxyCODONE-acetaminophen (PERCOCET/ROXICET) 5-325 MG per tablet 1 tablet (1 tablet Oral Given 05/24/20 1217)  morphine 4 MG/ML injection 4 mg (4 mg Intravenous Given 05/24/20 1630)    Mobility walks with device High fall risk   Focused Assessments    R Recommendations: See Admitting Provider Note  Report given to:   Additional Notes:

## 2020-05-25 NOTE — Progress Notes (Signed)
  Echocardiogram 2D Echocardiogram has been performed.  Kyle [name] 05/25/2020, 9:42 AM

## 2020-05-25 NOTE — Plan of Care (Signed)
  Problem: Acute Rehab PT Goals(only PT should resolve) Goal: Pt Will Go Supine/Side To Sit Outcome: Progressing Flowsheets (Taken 05/25/2020 1328) Pt will go Supine/Side to Sit: with supervision Goal: Pt Will Go Sit To Supine/Side Outcome: Progressing Flowsheets (Taken 05/25/2020 1328) Pt will go Sit to Supine/Side: with supervision Goal: Patient Will Transfer Sit To/From Stand Outcome: Progressing Flowsheets (Taken 05/25/2020 1328) Patient will transfer sit to/from stand: with supervision Goal: Pt Will Transfer Bed To Chair/Chair To Bed Outcome: Progressing Flowsheets (Taken 05/25/2020 1328) Pt will Transfer Bed to Chair/Chair to Bed: with supervision Goal: Pt Will Ambulate Outcome: Progressing Flowsheets (Taken 05/25/2020 1328) Pt will Ambulate:  15 feet  with min guard assist  with rolling walker   Pamala Hurry D. Hartnett-Rands, MS, PT Per [place] [phone] 05/25/2020

## 2020-05-25 NOTE — NC FL2 (Signed)
MEDICAID FL2 LEVEL OF CARE SCREENING TOOL     IDENTIFICATION  Patient Name: Kyle [name] Birthdate: 10/14/1955 Sex: male Admission Date (Current Location): 05/24/2020  [facility] and [place] Number:  [employer] and Address:  [place] [address] [place]      Provider Number: [id number]  Attending Physician Name and Address:  Murlean Iba, MD  Relative Name and Phone Number:       Current Level of Care: Hospital Recommended Level of Care: [place] Prior Approval Number:    Date Approved/Denied:   PASRR Number: [id number]  Discharge Plan: SNF    Current Diagnoses: Patient Active Problem List   Diagnosis Date Noted  . Chest pain 05/24/2020  . Cellulitis 07/31/2019  . Left arm pain 07/30/2019  . AKI (acute kidney injury) ([place]) 07/30/2019  . Normocytic anemia 07/30/2019  . Right anterior knee pain   . Hematoma of leg, right, subsequent encounter   . Hypernatremia   . Aspiration into airway 06/08/2019  . Aspiration pneumonia ([place])   . Dysphagia due to recent cerebrovascular accident 05/18/2019  . Tobacco use disorder 05/18/2019  . Chronic pain syndrome 05/18/2019  . Stroke ([place]) R MCA emb d/t AF not on [facility] s/p partial revascularization w/ IR 04/30/2019  . Middle cerebral artery embolism, right 04/30/2019  . Chronic atrial fibrillation ([place]) 08/26/2015  . Hyperlipidemia LDL goal <100 06/26/2014  . Routine general medical examination at a health care facility 11/21/2013  . Screening PSA (prostate specific antigen) 11/21/2013  . Special screening for malignant neoplasms, colon 11/21/2013  . OSA (obstructive sleep apnea) 08/22/2013  . Morbid obesity ([place]) 04/19/2013  . Other emphysema ([place]) 01/31/2013  . Essential hypertension 12/21/2012  . Insomnia 12/21/2012  . GERD (gastroesophageal reflux disease) 09/20/2012  . Back pain, chronic 09/20/2012  . Obesity hypoventilation syndrome ([place])  09/20/2012  . Adjustment disorder with mixed anxiety and depressed mood 09/20/2012    Orientation RESPIRATION BLADDER Height & Weight     Self,Time,Situation,Place  Normal Continent Weight:   Height:     BEHAVIORAL SYMPTOMS/MOOD NEUROLOGICAL BOWEL NUTRITION STATUS      Continent Diet (Dysphagia diet)  AMBULATORY STATUS COMMUNICATION OF NEEDS Skin   Limited Assist Verbally Normal                       Personal Care Assistance Level of Assistance  Bathing,Feeding,Dressing Bathing Assistance: Limited assistance Feeding assistance: Independent Dressing Assistance: Limited assistance     Functional Limitations Info  Sight,Hearing,Speech Sight Info: Adequate Hearing Info: Adequate Speech Info: Impaired (Patient can be extremely difficult to understand)    [place]  PT (By licensed PT)     PT Frequency: 5x's/week              Contractures Contractures Info: Not present    Additional Factors Info  Code Status,Allergies,Psychotropic Code Status Info: Full Allergies Info: Vioxx (Rofecoxib) Psychotropic Info: Xanax         Current Medications (05/25/2020):  This is the current hospital active medication list Current Facility-Administered Medications  Medication Dose Route Frequency Provider Last Rate Last Admin  . acetaminophen (TYLENOL) tablet 650 mg  650 mg Oral Q4H PRN Elgergawy, Silver Huguenin, MD      . ALPRAZolam Duanne Moron) tablet 0.25 mg  0.25 mg Oral TID PRN Wynetta Emery, Clanford L, MD   0.25 mg at 05/25/20 1422  . apixaban (ELIQUIS) tablet 5 mg  5 mg  Oral BID Elgergawy, Silver Huguenin, MD   5 mg at 05/25/20 0944  . metoprolol tartrate (LOPRESSOR) tablet 12.5 mg  12.5 mg Oral BID Johnson, Clanford L, MD   12.5 mg at 05/25/20 0944  . nitroGLYCERIN (NITROSTAT) SL tablet 0.4 mg  0.4 mg Sublingual Q5 Min x 3 PRN Elgergawy, Silver Huguenin, MD      . ondansetron (ZOFRAN) injection 4 mg  4 mg Intravenous Q6H PRN Elgergawy, Silver Huguenin, MD      . oxyCODONE (Oxy  IR/ROXICODONE) immediate release tablet 5 mg  5 mg Oral Q4H PRN Johnson, Clanford L, MD   5 mg at 05/25/20 1114  . pantoprazole sodium (PROTONIX) 40 mg/20 mL oral suspension 40 mg  40 mg Oral Daily Elgergawy, Silver Huguenin, MD   40 mg at 05/24/20 2219   Current Outpatient Medications  Medication Sig Dispense Refill  . acetaminophen (TYLENOL) 650 MG CR tablet Take 650 mg by mouth 3 (three) times daily.    Marland Kitchen albuterol (PROVENTIL HFA;VENTOLIN HFA) 108 (90 Base) MCG/ACT inhaler Inhale 2 puffs into the lungs every 4 (four) hours as needed for wheezing or shortness of breath. 1 Inhaler 5  . ALPRAZolam (XANAX) 0.5 MG tablet Place 1 tablet (0.5 mg total) into feeding tube 3 (three) times daily. (Patient taking differently: Take 0.5 mg by mouth 3 (three) times daily as needed for anxiety.) 90 tablet 0  . Amino Acids-Protein Hydrolys (FEEDING SUPPLEMENT, PRO-STAT SUGAR FREE 64,) LIQD Place 30 mLs into feeding tube 3 (three) times daily. (Patient taking differently: Take 30 mLs by mouth 3 (three) times daily. ) 887 mL 0  . apixaban (ELIQUIS) 5 MG TABS tablet Take 1 tablet (5 mg total) by mouth 2 (two) times daily.    Marland Kitchen atorvastatin (LIPITOR) 40 MG tablet Take 40 mg by mouth daily.    . busPIRone (BUSPAR) 10 MG tablet Take 10 mg by mouth in the morning and at bedtime.    . carvedilol (COREG) 3.125 MG tablet Take 3.125 mg by mouth 2 (two) times daily.    . cyclobenzaprine (FLEXERIL) 10 MG tablet Place 1 tablet (10 mg total) into feeding tube 3 (three) times daily as needed for muscle spasms. (Patient not taking: Reported on 05/25/2020) 30 tablet 3  . diphenhydrAMINE (BENADRYL) 25 mg capsule Take 25 mg by mouth every 4 (four) hours as needed for itching or allergies.    Marland Kitchen gabapentin (NEURONTIN) 100 MG capsule Take 100 mg by mouth in the morning and at bedtime.    . gabapentin (NEURONTIN) 300 MG capsule Take 300 mg by mouth 3 (three) times daily.    Marland Kitchen lisinopril (ZESTRIL) 40 MG tablet Take 40 mg by mouth daily. LF:  03/29/20 DS:30    . metoprolol tartrate (LOPRESSOR) 25 mg/10 mL SUSP Place 5 mLs (12.5 mg total) into feeding tube 2 (two) times daily. (Patient not taking: Reported on 05/25/2020)    . oxyCODONE (ROXICODONE) 5 MG/5ML solution Place 7.5 mLs (7.5 mg total) into feeding tube every 6 (six) hours as needed for moderate pain or severe pain. (Patient taking differently: Take 5 mg by mouth every 4 (four) hours as needed for moderate pain or severe pain.)  0  . pantoprazole (PROTONIX) 20 MG tablet Take 20 mg by mouth daily.    . pantoprazole sodium (PROTONIX) 40 mg/20 mL PACK Place 20 mLs (40 mg total) into feeding tube daily. (Patient not taking: Reported on 05/25/2020) 30 mL   . sertraline (ZOLOFT) 20 MG/ML concentrated solution Place 1  mL (20 mg total) into feeding tube daily. (Patient not taking: Reported on 05/25/2020)    . sertraline (ZOLOFT) 50 MG tablet Take 50 mg by mouth daily.       Discharge Medications: Please see discharge summary for a list of discharge medications.  Relevant Imaging Results:  Relevant Lab Results:   Additional Information SSN: [id number]  Sherie Don, LCSW

## 2020-05-25 NOTE — Care Management Obs Status (Signed)
[place] NOTIFICATION   Patient Details  Name: Kyle [name] MRN: [id number] Date of Birth: 09/15/1955   Medicare Observation Status Notification Given:  Yes    Sherie Don, LCSW 05/25/2020, 3:29 PM

## 2020-05-25 NOTE — Progress Notes (Signed)
PROGRESS NOTE   Kyle [name]  UXN:[id number] DOB: December 10, 1955 DOA: 05/24/2020 PCP: [name], MD   Chief Complaint  Patient presents with  . Chest Pain    Brief Admission History:  64 y.o. male, with past medical history of A. fib, on Eliquis, hypertension, COPD, obesity, CVA with right MCA infarct, embolic, status post lumpectomy, on Eliquis, patient presents to ED today secondary for chest pain, patient with severe dysarthria, very difficult to understand, only L able to nod his head, and understands few words, apparently patient was recently at [facility] for few month, he was discharged and supposed to go to another assisted living facility, but she went to motel instead, he has been in the motel for 2 days, port he has been eating and drinking not much at the hotel, he does report intermittent chest pain, radiates to the left shoulder, reports stabbing sensation, will he does report some dyspnea, poor and vague historian, history is very unreliable, patient tells me he is currently homeless.  ED EKG was nonacute, and troponins were negative x2, initially he had reproducible chest pain on palpation, but this has resolved after receiving morphine, after discussion with Dr. Harl Bowie he recommended admission overnight for observation, and to obtain 2D echo in a.m., and if negative he can be discharged with outpatient follow-up.  Assessment & Plan:   Active Problems:   GERD (gastroesophageal reflux disease)   Adjustment disorder with mixed anxiety and depressed mood   Essential hypertension   Chronic atrial fibrillation (HCC)   Middle cerebral artery embolism, right   Chronic pain syndrome   Chest pain   1. Atypical chest pain  - symptoms seem to have resolved now.  His HS troponin tests have been negative.  2D echocardiogram pending.   2. Atrial fibrillation - he is on apixaban for anticoagulation. Resume home rate control medication. 3. Essential hypertension -BP stable, resumed home  medication.  4. Chronic pain - resume home pain management.  5. Homelessness - I have asked for a TOC consultation.   DVT prophylaxis:  apixaban Code Status: full  Family Communication: none present  Disposition: TBD   Status is: Observation  The patient remains OBS appropriate and will d/c before 2 midnights.  Dispo: The patient is from: SNF              Anticipated d/c is to: TBD              Anticipated d/c date is: 1 day              Patient currently is medically stable to d/c.  Consultants:   TOC   Procedures:   2D Echocardiogram   Antimicrobials:  n/a   Subjective: Pt reports chronic joint pains and aches.  He feels like he needs his pain medication.  He says that his cousin "lied to me" and that is why he had signed out of his prior SNF.  He says that he is homeless at this time.  He is not complaining of chest pain anymore.   Objective: Vitals:   05/25/20 0730 05/25/20 0815 05/25/20 0930 05/25/20 0945  BP:  135/73  (!) 156/83  Pulse: 74 72 74 (!) 57  Resp: 18 (!) 22 19 20   Temp:      TempSrc:      SpO2: 96% 99% 97% 94%   No intake or output data in the 24 hours ending 05/25/20 1017 There were no vitals filed for this visit.  Examination:  General exam: speech impediment, chronic facial droop, chronic tongue deviation from prior stroke, Appears calm and comfortable, difficult to understand speech.  Respiratory system: Clear to auscultation. Respiratory effort normal. Cardiovascular system: normal S1 & S2 heard. No JVD, murmurs, rubs, gallops or clicks. No pedal edema. Gastrointestinal system: Abdomen is nondistended, soft and nontender. No organomegaly or masses felt. Normal bowel sounds heard. Central nervous system: Alert and oriented. Chronic old stroke deficits.  Extremities: Symmetric 5 x 5 power. Skin: No rashes, lesions or ulcers Psychiatry: Judgement and insight appear poor. Mood & affect appropriate.   Data Reviewed: I have personally reviewed  following labs and imaging studies  CBC: Recent Labs  Lab 05/24/20 1103  WBC 8.3  HGB 14.9  HCT 45.1  MCV 91.3  PLT 329    Basic Metabolic Panel: Recent Labs  Lab 05/24/20 1103  NA 138  K 3.8  CL 101  CO2 26  GLUCOSE 89  BUN 11  CREATININE 0.75  CALCIUM 8.8*    GFR: CrCl cannot be calculated (Unknown ideal weight.).  Liver Function Tests: No results for input(s): AST, ALT, ALKPHOS, BILITOT, PROT, ALBUMIN in the last 168 hours.  CBG: No results for input(s): GLUCAP in the last 168 hours.  Recent Results (from the past 240 hour(s))  Resp Panel by RT-PCR (Flu A&B, Covid) Nasopharyngeal Swab     Status: None   Collection Time: 05/24/20 11:30 AM   Specimen: Nasopharyngeal Swab; Nasopharyngeal(NP) swabs in vial transport medium  Result Value Ref Range Status   SARS Coronavirus 2 by RT PCR NEGATIVE NEGATIVE Final    Comment: (NOTE) SARS-CoV-2 target nucleic acids are NOT DETECTED.  The SARS-CoV-2 RNA is generally detectable in upper respiratory specimens during the acute phase of infection. The lowest concentration of SARS-CoV-2 viral copies this assay can detect is 138 copies/mL. A negative result does not preclude SARS-Cov-2 infection and should not be used as the sole basis for treatment or other patient management decisions. A negative result may occur with  improper specimen collection/handling, submission of specimen other than nasopharyngeal swab, presence of viral mutation(s) within the areas targeted by this assay, and inadequate number of viral copies(<138 copies/mL). A negative result must be combined with clinical observations, patient history, and epidemiological information. The expected result is Negative.  Fact Sheet for Patients:  EntrepreneurPulse.com.au  Fact Sheet for Healthcare Providers:  IncredibleEmployment.be  This test is no t yet approved or cleared by the [place] FDA and  has been authorized  for detection and/or diagnosis of SARS-CoV-2 by FDA under an Emergency Use Authorization (EUA). This EUA will remain  in effect (meaning this test can be used) for the duration of the COVID-19 declaration under Section 564(b)(1) of the Act, 21 U.S.C.section 360bbb-3(b)(1), unless the authorization is terminated  or revoked sooner.       Influenza A by PCR NEGATIVE NEGATIVE Final   Influenza B by PCR NEGATIVE NEGATIVE Final    Comment: (NOTE) The Xpert Xpress SARS-CoV-2/FLU/RSV plus assay is intended as an aid in the diagnosis of influenza from Nasopharyngeal swab specimens and should not be used as a sole basis for treatment. Nasal washings and aspirates are unacceptable for Xpert Xpress SARS-CoV-2/FLU/RSV testing.  Fact Sheet for Patients: EntrepreneurPulse.com.au  Fact Sheet for Healthcare Providers: IncredibleEmployment.be  This test is not yet approved or cleared by the [place] FDA and has been authorized for detection and/or diagnosis of SARS-CoV-2 by FDA under an Emergency Use Authorization (EUA). This EUA will remain in effect (  meaning this test can be used) for the duration of the COVID-19 declaration under Section 564(b)(1) of the Act, 21 U.S.C. section 360bbb-3(b)(1), unless the authorization is terminated or revoked.  Performed at [facility], [address], [place], [place] [address]      Radiology Studies: [facility] 1 View  Result Date: 05/24/2020 CLINICAL DATA:  Chest pain today. EXAM: PORTABLE CHEST 1 VIEW COMPARISON:  04/24/2020 FINDINGS: Cardiopericardial silhouette is borderline enlarged. No mediastinal or hilar masses. Prominent lung markings, stable.  Lungs otherwise clear. No pleural effusion or pneumothorax. Skeletal structures are grossly intact. IMPRESSION: No acute cardiopulmonary disease. Electronically Signed   By: Lajean Manes M.D.   On: 05/24/2020 11:03   Scheduled Meds: . apixaban  5 mg Oral BID   . metoprolol tartrate  12.5 mg Oral BID  . pantoprazole sodium  40 mg Oral Daily   Continuous Infusions:   LOS: 0 days   Time spent: 33 mins    Jeryn Bertoni Wynetta Emery, MD How to contact the [facility] Attending or Consulting provider [place] or covering provider during after hours [place], for this patient?  1. Check the care team in [facility] and look for a) attending/consulting TRH provider listed and b) the [facility] team listed 2. Log into [email or web address] and use [place]'s universal password to access. If you do not have the password, please contact the hospital operator. 3. Locate the [facility] provider you are looking for under [facility] and page to a number that you can be directly reached. 4. If you still have difficulty reaching the provider, please page the [facility] (Director on Call) for the Hospitalists listed on amion for assistance.  05/25/2020, 10:17 AM

## 2020-05-25 NOTE — Evaluation (Signed)
Physical Therapy Evaluation Patient Details Name: Kyle [name] MRN: [id number] DOB: 11-09-1955 Today's Date: 05/25/2020   History of Present Illness  64 y.o. male, with past medical history of A. fib, on Eliquis, hypertension, COPD, obesity, CVA with right MCA infarct, embolic, status post lumpectomy, on Eliquis, patient presents to ED today secondary for chest pain, patient with severe dysarthria, very difficult to understand, only L able to nod his head, and understands few words, apparently patient was recently at [facility] for few month, he was discharged and supposed to go to another assisted living facility, but she went to motel instead, he has been in the motel for 2 days, port he has been eating and drinking not much at the hotel, he does report intermittent chest pain, radiates to the left shoulder, reports stabbing sensation, will he does report some dyspnea, poor and vague historian, history is very unreliable, patient tells me he is currently homeless.  ED EKG was nonacute, and troponins were negative x2, initially he had reproducible chest pain on palpation, but this has resolved after receiving morphine, after discussion with Dr. Harl Bowie he recommended admission overnight for observation, and to obtain 2D echo in a.m., and if negative he can be discharged with outpatient follow-up.    Clinical Impression  Pt admitted with above diagnosis. Patient's current living situation is currently homeless so accessibility to a home, ie stairs, etc, are unknown at this time. Patient has relied on a manual wheelchair in the past so having access to a ramp may be beneficial for home access. Patient currently requires min guard to min assist for all mobility for safety. Patient exhibits significant hip external rotation with weight bearing activities such that a standard width RW impedes the advancement of his feet during ambulation as his toes catch on the back legs of the RW. This increases his risk for  falls.  Pt currently with functional limitations due to the deficits listed below (see PT Problem List). Pt will benefit from skilled PT to increase their independence and safety with mobility to allow discharge to the venue listed below.       Follow Up Recommendations Home health PT;SNF;Supervision for mobility/OOB;Supervision - Intermittent    Equipment Recommendations  3in1 (PT);Wheelchair (measurements PT) (in talking to patient, uncertain if patient still has access to manual wheelchair.)    Recommendations for Other Services       Precautions / Restrictions Precautions Precautions: Fall Restrictions Weight Bearing Restrictions: No      Mobility  Bed Mobility Overal bed mobility: Needs Assistance Bed Mobility: Supine to Sit;Sit to Supine     Supine to sit: Min guard Sit to supine: Min guard   General bed mobility comments: HOB elevated; use of bedrails    Transfers Overall transfer level: Needs assistance Equipment used: Rolling walker (2 wheeled) Transfers: Sit to/from [facility] Sit to Stand: Min guard;Min assist Stand pivot transfers: Min guard       General transfer comment: multiple attempts to power up; cues for sequencing of steps and placement of hands; wanted to pull up using RW  Ambulation/Gait Ambulation/Gait assistance: Min guard Gait Distance (Feet): 6 Feet Assistive device: Rolling walker (2 wheeled) Gait Pattern/deviations: Step-through pattern;Decreased step length - right;Decreased step length - left;Decreased stride length;Shuffle;Trunk flexed;Wide base of support Gait velocity: decreased   General Gait Details: significant bilateral hip external rotation and resultant toeing out such that standard width RW too narrow; slow, labored gait, on room air with O2 sats dipping to 86  post ambulation; recovered with verbal cues for pursed lip breathing; limited by fatigue  Stairs      Wheelchair Mobility    Modified Rankin  (Stroke Patients Only)       Balance Overall balance assessment: Needs assistance Sitting-balance support: Bilateral upper extremity supported;Feet supported Sitting balance-Leahy Scale: Fair     Standing balance support: Bilateral upper extremity supported;During functional activity Standing balance-Leahy Scale: Fair Standing balance comment: fair with RW         Pertinent Vitals/Pain Pain Assessment: No/denies pain    Home Living Family/patient expects to be discharged to:: Shelter/Homeless      Additional Comments: expected to be able to live with his cousin    Prior Function Level of Independence: Needs assistance   Gait / Transfers Assistance Needed: ambulation household distances;wheelchair use in the home as well  ADL's / Homemaking Assistance Needed: independent BADLs; assistance for IADLs  Comments: had been DC from SNF 2 days prior to admission     Hand Dominance        Extremity/Trunk Assessment   Upper Extremity Assessment Upper Extremity Assessment: Generalized weakness    Lower Extremity Assessment Lower Extremity Assessment: Generalized weakness;RLE deficits/detail;LLE deficits/detail RLE Deficits / Details: significant toeing out in weight bearing activities LLE Deficits / Details: significant toeing out in weight bearing activities       Communication   Communication: Expressive difficulties  Cognition Arousal/Alertness: Awake/alert Behavior During Therapy: WFL for tasks assessed/performed Overall Cognitive Status: Within Functional Limits for tasks assessed       General Comments: labile regarding current sitiation      General Comments      Exercises     Assessment/Plan    PT Assessment Patient needs continued PT services  PT Problem List Decreased strength;Decreased activity tolerance;Decreased balance;Decreased mobility;Decreased knowledge of use of DME;Decreased safety awareness;Obesity       PT Treatment Interventions  DME instruction;Gait training;Functional mobility training;Therapeutic activities;Therapeutic exercise;Balance training;Patient/family education    PT Goals (Current goals can be found in the Care Plan section)  Acute Rehab PT Goals Patient Stated Goal: Find a place to live and get HHPT PT Goal Formulation: With patient Time For Goal Achievement: 06/08/20 Potential to Achieve Goals: Fair    Frequency Min 3X/week   Barriers to discharge           AM-PAC PT "6 Clicks" Mobility  Outcome Measure Help needed turning from your back to your side while in a flat bed without using bedrails?: A Little Help needed moving from lying on your back to sitting on the side of a flat bed without using bedrails?: A Little Help needed moving to and from a bed to a chair (including a wheelchair)?: A Little Help needed standing up from a chair using your arms (e.g., wheelchair or bedside chair)?: A Little Help needed to walk in hospital room?: A Lot Help needed climbing 3-5 steps with a railing? : A Lot 6 Click Score: 16    End of Session Equipment Utilized During Treatment: Gait belt Activity Tolerance: Patient limited by fatigue Patient left: in bed;with call bell/phone within reach Nurse Communication: Mobility status PT Visit Diagnosis: Unsteadiness on feet (R26.81);Other abnormalities of gait and mobility (R26.89);Muscle weakness (generalized) (M62.81);Difficulty in walking, not elsewhere classified (R26.2)    Time: 1210-1240 PT Time Calculation (min) (ACUTE ONLY): 30 min   Charges:   PT Evaluation $PT Eval Moderate Complexity: 1 Mod PT Treatments $Therapeutic Activity: 8-22 mins  Floria Raveling. Hartnett-Rands, MS, PT Per [place] #[id number] 05/25/2020, 1:27 PM

## 2020-05-26 DIAGNOSIS — I10 Essential (primary) hypertension: Secondary | ICD-10-CM | POA: Diagnosis present

## 2020-05-26 DIAGNOSIS — Z23 Encounter for immunization: Secondary | ICD-10-CM | POA: Diagnosis present

## 2020-05-26 DIAGNOSIS — T471X6A Underdosing of other antacids and anti-gastric-secretion drugs, initial encounter: Secondary | ICD-10-CM | POA: Diagnosis present

## 2020-05-26 DIAGNOSIS — E669 Obesity, unspecified: Secondary | ICD-10-CM | POA: Diagnosis present

## 2020-05-26 DIAGNOSIS — Z888 Allergy status to other drugs, medicaments and biological substances status: Secondary | ICD-10-CM | POA: Diagnosis not present

## 2020-05-26 DIAGNOSIS — Z20822 Contact with and (suspected) exposure to covid-19: Secondary | ICD-10-CM | POA: Diagnosis present

## 2020-05-26 DIAGNOSIS — R0789 Other chest pain: Secondary | ICD-10-CM | POA: Diagnosis present

## 2020-05-26 DIAGNOSIS — I69322 Dysarthria following cerebral infarction: Secondary | ICD-10-CM | POA: Diagnosis not present

## 2020-05-26 DIAGNOSIS — T45516A Underdosing of anticoagulants, initial encounter: Secondary | ICD-10-CM | POA: Diagnosis present

## 2020-05-26 DIAGNOSIS — Z91138 Patient's unintentional underdosing of medication regimen for other reason: Secondary | ICD-10-CM | POA: Diagnosis not present

## 2020-05-26 DIAGNOSIS — Z87891 Personal history of nicotine dependence: Secondary | ICD-10-CM | POA: Diagnosis not present

## 2020-05-26 DIAGNOSIS — Z7901 Long term (current) use of anticoagulants: Secondary | ICD-10-CM | POA: Diagnosis not present

## 2020-05-26 DIAGNOSIS — F4323 Adjustment disorder with mixed anxiety and depressed mood: Secondary | ICD-10-CM | POA: Diagnosis present

## 2020-05-26 DIAGNOSIS — G894 Chronic pain syndrome: Secondary | ICD-10-CM | POA: Diagnosis present

## 2020-05-26 DIAGNOSIS — R079 Chest pain, unspecified: Secondary | ICD-10-CM | POA: Diagnosis present

## 2020-05-26 DIAGNOSIS — I482 Chronic atrial fibrillation, unspecified: Secondary | ICD-10-CM | POA: Diagnosis present

## 2020-05-26 DIAGNOSIS — Z79899 Other long term (current) drug therapy: Secondary | ICD-10-CM | POA: Diagnosis not present

## 2020-05-26 DIAGNOSIS — T43226A Underdosing of selective serotonin reuptake inhibitors, initial encounter: Secondary | ICD-10-CM | POA: Diagnosis present

## 2020-05-26 DIAGNOSIS — I69392 Facial weakness following cerebral infarction: Secondary | ICD-10-CM | POA: Diagnosis not present

## 2020-05-26 DIAGNOSIS — Z5900 Homelessness unspecified: Secondary | ICD-10-CM | POA: Diagnosis not present

## 2020-05-26 DIAGNOSIS — T447X6A Underdosing of beta-adrenoreceptor antagonists, initial encounter: Secondary | ICD-10-CM | POA: Diagnosis present

## 2020-05-26 DIAGNOSIS — J438 Other emphysema: Secondary | ICD-10-CM | POA: Diagnosis present

## 2020-05-26 DIAGNOSIS — K219 Gastro-esophageal reflux disease without esophagitis: Secondary | ICD-10-CM | POA: Diagnosis present

## 2020-05-26 DIAGNOSIS — T424X6A Underdosing of benzodiazepines, initial encounter: Secondary | ICD-10-CM | POA: Diagnosis present

## 2020-05-26 DIAGNOSIS — Z6831 Body mass index (BMI) 31.0-31.9, adult: Secondary | ICD-10-CM | POA: Diagnosis not present

## 2020-05-26 LAB — GLUCOSE, CAPILLARY: Glucose-Capillary: 126 mg/dL — ABNORMAL HIGH (ref 70–99)

## 2020-05-26 NOTE — Progress Notes (Signed)
PROGRESS NOTE   Kyle [name]  OQH:[id number] DOB: 07/10/1955 DOA: 05/24/2020 PCP: [name], MD   Chief Complaint  Patient presents with  . Chest Pain    Brief Admission History:  64 y.o. male, with past medical history of A. fib, on Eliquis, hypertension, COPD, obesity, CVA with right MCA infarct, embolic, status post lumpectomy, on Eliquis, patient presents to ED today secondary for chest pain, patient with severe dysarthria, very difficult to understand, only L able to nod his head, and understands few words, apparently patient was recently at [facility] for few month, he was discharged and supposed to go to another assisted living facility, but she went to motel instead, he has been in the motel for 2 days, port he has been eating and drinking not much at the hotel, he does report intermittent chest pain, radiates to the left shoulder, reports stabbing sensation, will he does report some dyspnea, poor and vague historian, history is very unreliable, patient tells me he is currently homeless.  ED EKG was nonacute, and troponins were negative x2, initially he had reproducible chest pain on palpation, but this has resolved after receiving morphine, after discussion with Dr. Harl Bowie he recommended admission overnight for observation, and to obtain 2D echo in a.m., and if negative he can be discharged with outpatient follow-up.  Assessment & Plan:   Active Problems:   GERD (gastroesophageal reflux disease)   Adjustment disorder with mixed anxiety and depressed mood   Essential hypertension   Chronic atrial fibrillation (HCC)   Middle cerebral artery embolism, right   Chronic pain syndrome   Chest pain  1. Atypical chest pain  - symptoms seem to have resolved now.  His HS troponin tests have been negative.  2D echocardiogram with NO findings of regional wall motion abnormalities.   2. Atrial fibrillation - stable and rated controlled.  He is on apixaban for anticoagulation. Resumed home rate  control medication. 3. Essential hypertension -BP stable, resumed home medication.  4. Chronic pain - resumed home pain management and pain seems controlled at this time.  5. Homelessness - I have asked for a TOC consultation and they are working on placement.   DVT prophylaxis:  apixaban Code Status: full  Family Communication: none present  Disposition: TBD   Status is: Observation  The patient remains OBS appropriate and will d/c before 2 midnights.  Dispo: The patient is from: SNF              Anticipated d/c is to: TBD              Anticipated d/c date is: 1 day              Patient currently is medically stable to d/c.  Awaiting placement   Consultants:   TOC   Procedures:   2D Echocardiogram   Antimicrobials:  n/a   Subjective: Pt has no complaints.  He is agreeable to SNF rehab placement.    Objective: Vitals:   05/25/20 2047 05/25/20 2213 05/26/20 0210 05/26/20 0600  BP: (!) 119/56 117/79 111/62 114/70  Pulse: 74 (!) 56 (!) 55 60  Resp:  18 16 16   Temp:  97.7 F (36.5 C) 97.7 F (36.5 C) 97.7 F (36.5 C)  TempSrc:  Oral Oral Oral  SpO2:  94% 96% 96%  Weight:      Height:        Intake/Output Summary (Last 24 hours) at 05/26/2020 1128 Last data filed at 05/26/2020 0600 Gross per  24 hour  Intake 480 ml  Output --  Net 480 ml   Filed Weights   05/25/20 1721 05/25/20 1850  Weight: 119 kg 119 kg    Examination:  General exam: speech impediment, chronic facial droop, chronic tongue deviation from prior stroke, Appears calm and comfortable, difficult to understand speech.  Respiratory system: Clear to auscultation. Respiratory effort normal. Cardiovascular system: normal S1 & S2 heard. No JVD, murmurs, rubs, gallops or clicks. No pedal edema. Gastrointestinal system: Abdomen is nondistended, soft and nontender. No organomegaly or masses felt. Normal bowel sounds heard. Central nervous system: Alert and oriented. Chronic old stroke deficits.   Extremities: Symmetric 5 x 5 power. Osteoarthritic changes diffusely seen.  Skin: No rashes, lesions or ulcers Psychiatry: Judgement and insight appear poor. Mood & affect appropriate.   Data Reviewed: I have personally reviewed following labs and imaging studies  CBC: Recent Labs  Lab 05/24/20 1103  WBC 8.3  HGB 14.9  HCT 45.1  MCV 91.3  PLT 825    Basic Metabolic Panel: Recent Labs  Lab 05/24/20 1103  NA 138  K 3.8  CL 101  CO2 26  GLUCOSE 89  BUN 11  CREATININE 0.75  CALCIUM 8.8*    GFR: Estimated Creatinine Clearance: 136.9 mL/min (by C-G formula based on SCr of 0.75 mg/dL).  Liver Function Tests: No results for input(s): AST, ALT, ALKPHOS, BILITOT, PROT, ALBUMIN in the last 168 hours.  CBG: No results for input(s): GLUCAP in the last 168 hours.  Recent Results (from the past 240 hour(s))  Resp Panel by RT-PCR (Flu A&B, Covid) Nasopharyngeal Swab     Status: None   Collection Time: 05/24/20 11:30 AM   Specimen: Nasopharyngeal Swab; Nasopharyngeal(NP) swabs in vial transport medium  Result Value Ref Range Status   SARS Coronavirus 2 by RT PCR NEGATIVE NEGATIVE Final    Comment: (NOTE) SARS-CoV-2 target nucleic acids are NOT DETECTED.  The SARS-CoV-2 RNA is generally detectable in upper respiratory specimens during the acute phase of infection. The lowest concentration of SARS-CoV-2 viral copies this assay can detect is 138 copies/mL. A negative result does not preclude SARS-Cov-2 infection and should not be used as the sole basis for treatment or other patient management decisions. A negative result may occur with  improper specimen collection/handling, submission of specimen other than nasopharyngeal swab, presence of viral mutation(s) within the areas targeted by this assay, and inadequate number of viral copies(<138 copies/mL). A negative result must be combined with clinical observations, patient history, and epidemiological information. The  expected result is Negative.  Fact Sheet for Patients:  EntrepreneurPulse.com.au  Fact Sheet for Healthcare Providers:  IncredibleEmployment.be  This test is no t yet approved or cleared by the [place] FDA and  has been authorized for detection and/or diagnosis of SARS-CoV-2 by FDA under an Emergency Use Authorization (EUA). This EUA will remain  in effect (meaning this test can be used) for the duration of the COVID-19 declaration under Section 564(b)(1) of the Act, 21 U.S.C.section 360bbb-3(b)(1), unless the authorization is terminated  or revoked sooner.       Influenza A by PCR NEGATIVE NEGATIVE Final   Influenza B by PCR NEGATIVE NEGATIVE Final    Comment: (NOTE) The Xpert Xpress SARS-CoV-2/FLU/RSV plus assay is intended as an aid in the diagnosis of influenza from Nasopharyngeal swab specimens and should not be used as a sole basis for treatment. Nasal washings and aspirates are unacceptable for Xpert Xpress SARS-CoV-2/FLU/RSV testing.  Fact Sheet for Patients:  EntrepreneurPulse.com.au  Fact Sheet for Healthcare Providers: IncredibleEmployment.be  This test is not yet approved or cleared by the [place] FDA and has been authorized for detection and/or diagnosis of SARS-CoV-2 by FDA under an Emergency Use Authorization (EUA). This EUA will remain in effect (meaning this test can be used) for the duration of the COVID-19 declaration under Section 564(b)(1) of the Act, 21 U.S.C. section 360bbb-3(b)(1), unless the authorization is terminated or revoked.  Performed at [facility], [address], [place], [place] [address]      Radiology Studies: ECHOCARDIOGRAM COMPLETE  Result Date: 05/25/2020    ECHOCARDIOGRAM REPORT   Patient Name:   MIZAEL SAGAR [facility] Date of Exam: 05/25/2020 Medical Rec #:  [id number]        Height:       77.0 in Accession #:    [id number]       Weight:       351.0 lb  Date of Birth:  1956/03/15         BSA:          2.841 m Patient Age:    50 years         BP:           135/73 mmHg Patient Gender: M                HR:           72 bpm. Exam Location:  [facility] Procedure: 2D Echo, Cardiac Doppler and Color Doppler Indications:    Chest pain  History:        Patient has prior history of Echocardiogram examinations, most                 recent 04/30/2019. Stroke, Arrythmias:Atrial Fibrillation,                 Signs/Symptoms:Chest Pain; Risk Factors:Hypertension, Former                 Smoker and Obesity.  Sonographer:    Dustin Flock RDCS Referring Phys: [id number] DA[address] S [facility]  Sonographer Comments: Image acquisition challenging due to COPD and Image acquisition challenging due to patient body habitus. IMPRESSIONS  1. There has been no change since the prior study on 04/30/2019.  2. Left ventricular ejection fraction, by estimation, is 60 to 65%. The left ventricle has normal function. The left ventricle has no regional wall motion abnormalities. Left ventricular diastolic parameters are consistent with Grade I diastolic dysfunction (impaired relaxation).  3. Right ventricular systolic function is normal. The right ventricular size is normal.  4. Left atrial size was mildly dilated.  5. Right atrial size was mildly dilated.  6. The mitral valve is normal in structure. Mild mitral valve regurgitation. No evidence of mitral stenosis. Moderate mitral annular calcification.  7. The aortic valve is normal in structure. There is mild calcification of the aortic valve. There is mild thickening of the aortic valve. Aortic valve regurgitation is not visualized. Mild to moderate aortic valve sclerosis/calcification is present, without any evidence of aortic stenosis.  8. The inferior vena cava is normal in size with greater than 50% respiratory variability, suggesting right atrial pressure of 3 mmHg. FINDINGS  Left Ventricle: Left ventricular ejection fraction, by estimation, is 60 to  65%. The left ventricle has normal function. The left ventricle has no regional wall motion abnormalities. The left ventricular internal cavity size was normal in size. There is  no left ventricular hypertrophy. Left ventricular diastolic parameters  are consistent with Grade I diastolic dysfunction (impaired relaxation). Normal left ventricular filling pressure. Right Ventricle: The right ventricular size is normal. No increase in right ventricular wall thickness. Right ventricular systolic function is normal. Left Atrium: Left atrial size was mildly dilated. Right Atrium: Right atrial size was mildly dilated. Pericardium: There is no evidence of pericardial effusion. Mitral Valve: The mitral valve is normal in structure. Moderate mitral annular calcification. Mild mitral valve regurgitation. No evidence of mitral valve stenosis. Tricuspid Valve: The tricuspid valve is normal in structure. Tricuspid valve regurgitation is mild . No evidence of tricuspid stenosis. Aortic Valve: The aortic valve is normal in structure. There is mild calcification of the aortic valve. There is mild thickening of the aortic valve. Aortic valve regurgitation is not visualized. Mild to moderate aortic valve sclerosis/calcification is present, without any evidence of aortic stenosis. Pulmonic Valve: The pulmonic valve was normal in structure. Pulmonic valve regurgitation is not visualized. No evidence of pulmonic stenosis. Aorta: The aortic root is normal in size and structure. Venous: The inferior vena cava is normal in size with greater than 50% respiratory variability, suggesting right atrial pressure of 3 mmHg. IAS/Shunts: No atrial level shunt detected by color flow Doppler.  LEFT VENTRICLE PLAX 2D LVIDd:         4.11 cm  Diastology LVIDs:         2.91 cm  LV e' medial:    9.46 cm/s LV PW:         1.36 cm  LV E/e' medial:  11.5 LV IVS:        1.16 cm  LV e' lateral:   12.10 cm/s LVOT diam:     2.10 cm  LV E/e' lateral: 9.0 LV SV:          62 LV SV Index:   22 LVOT Area:     3.46 cm  RIGHT VENTRICLE RV Basal diam:  3.44 cm RV S prime:     12.70 cm/s TAPSE (M-mode): 3.1 cm LEFT ATRIUM             Index       RIGHT ATRIUM           Index LA diam:        3.90 cm 1.37 cm/m  RA Area:     22.60 cm LA Vol (A2C):   60.2 ml 21.19 ml/m RA Volume:   65.20 ml  22.95 ml/m LA Vol (A4C):   59.8 ml 21.05 ml/m LA Biplane Vol: 60.8 ml 21.40 ml/m  AORTIC VALVE LVOT Vmax:   74.60 cm/s LVOT Vmean:  55.400 cm/s LVOT VTI:    0.178 m  AORTA Ao Root diam: 3.20 cm MITRAL VALVE MV Area (PHT): 3.21 cm     SHUNTS MV Decel Time: 236 msec     Systemic VTI:  0.18 m MV E velocity: 109.00 cm/s  Systemic Diam: 2.10 cm Ena Dawley MD Electronically signed by Ena Dawley MD Signature Date/Time: 05/25/2020/1:10:13 PM    Final    Scheduled Meds: . apixaban  5 mg Oral BID  . metoprolol tartrate  12.5 mg Oral BID  . pantoprazole sodium  40 mg Oral Daily   Continuous Infusions:   LOS: 0 days   Time spent: 30 mins   Shamiyah Ngu Wynetta Emery, MD How to contact the [facility] Attending or Consulting provider [place] or covering provider during after hours [place], for this patient?  1. Check the care team in [facility] and look for  a) attending/consulting [place] provider listed and b) the [facility] team listed 2. Log into [email or web address] and use [place]'s universal password to access. If you do not have the password, please contact the hospital operator. 3. Locate the [facility] provider you are looking for under [facility] and page to a number that you can be directly reached. 4. If you still have difficulty reaching the provider, please page the [facility] (Director on Call) for the Hospitalists listed on amion for assistance.  05/26/2020, 11:28 AM

## 2020-05-26 NOTE — TOC Progression Note (Addendum)
Transition of Care [facility]) - Progression Note    Patient Details  Name: Kyle [name] MRN: [id number] Date of Birth: Oct 16, 1955  Transition of Care [facility]) CM/SW Contact  Shade Flood, LCSW Phone Number: 05/26/2020, 10:21 AM  Clinical Narrative:     TOC following. Received fax from patient's insurance requesting additional clinical information. Requested information was faxed this AM. SNF bed offers still pending. TOC will follow for placement needs.  11:36 Received call from [facility] stating that pt is authorized for SNF but that they cannot complete the process until they know which SNF pt selects. At this time, pt does not have any SNF bed offers. Anticipating this will not change today due to it being the weekend. Assigned TOC will follow up Monday once bed offers available and update [facility] on bed choice. [employer] rep states that they can inform TOC at that time if the chosen SNF is in network and then the [place] details.  Expected Discharge Plan: [place] Barriers to Discharge: Continued Medical Work up  Expected Discharge Plan and Services Expected Discharge Plan: [place] In-house Referral: Clinical Social Work Discharge Planning Services: NA Post Acute Care Choice: [place] Living arrangements for the past 2 months: Homeless                 DME Arranged: N/A DME Agency: NA       HH Arranged: NA HH Agency: NA         Social Determinants of Health (SDOH) Interventions    Readmission Risk Interventions Readmission Risk Prevention Plan 08/08/2019  Transportation Screening Complete  Medication Review (Consulting civil engineer) Referral to Pharmacy  PCP or Specialist appointment within 3-5 days of discharge Complete  Skilled [place] Complete  Some recent data might be hidden

## 2020-05-27 MED ORDER — PANTOPRAZOLE SODIUM 40 MG PO TBEC
40.0000 mg | DELAYED_RELEASE_TABLET | Freq: Every day | ORAL | Status: DC
  Administered 2020-05-27 – 2020-05-28 (×2): 40 mg via ORAL
  Filled 2020-05-27 (×2): qty 1

## 2020-05-27 MED ORDER — OXYCODONE HCL 5 MG PO TABS
10.0000 mg | ORAL_TABLET | ORAL | Status: DC | PRN
  Administered 2020-05-27 – 2020-05-28 (×4): 10 mg via ORAL
  Filled 2020-05-27 (×4): qty 2

## 2020-05-27 NOTE — Progress Notes (Signed)
PROGRESS NOTE   Kyle [name]  KWI:[id number] DOB: Dec 17, 1955 DOA: 05/24/2020 PCP: [name], MD   Chief Complaint  Patient presents with  . Chest Pain    Brief Admission History:  64 y.o. male, with past medical history of A. fib, on Eliquis, hypertension, COPD, obesity, CVA with right MCA infarct, embolic, status post lumpectomy, on Eliquis, patient presents to ED today secondary for chest pain, patient with severe dysarthria, very difficult to understand, only L able to nod his head, and understands few words, apparently patient was recently at [facility] for few month, he was discharged and supposed to go to another assisted living facility, but she went to motel instead, he has been in the motel for 2 days, port he has been eating and drinking not much at the hotel, he does report intermittent chest pain, radiates to the left shoulder, reports stabbing sensation, will he does report some dyspnea, poor and vague historian, history is very unreliable, patient tells me he is currently homeless.  ED EKG was nonacute, and troponins were negative x2, initially he had reproducible chest pain on palpation, but this has resolved after receiving morphine, after discussion with Dr. Harl Bowie he recommended admission overnight for observation, and to obtain 2D echo in a.m., and if negative he can be discharged with outpatient follow-up.  Assessment & Plan:   Active Problems:   GERD (gastroesophageal reflux disease)   Adjustment disorder with mixed anxiety and depressed mood   Essential hypertension   Chronic atrial fibrillation (HCC)   Middle cerebral artery embolism, right   Chronic pain syndrome   Chest pain  1. Atypical chest pain  - symptoms seem to have resolved now.  His HS troponin tests have been negative.  2D echocardiogram with NO findings of regional wall motion abnormalities.   2. Atrial fibrillation - stable and rated controlled.  He is on apixaban for anticoagulation. Resumed home rate  control medication. 3. Essential hypertension -BP stable, resumed home medication.  4. Chronic pain - resumed home pain management and pain seems controlled at this time.  I reviewed DC summary from [facility] 11/21 and it did have him as taking oxy IR 20 mg every 4 hours PRN.  I have increased oxycodone to 10 mg at this time.  Will follow.   5. Homelessness - I have asked for a TOC consultation and they are working on placement.   DVT prophylaxis:  apixaban Code Status: full  Family Communication: none present  Disposition: TBD   Status is: INP  The patient will require care spanning > 2 midnights and should be moved to inpatient because: Unsafe d/c plan  Dispo: The patient is from: SNF              Anticipated d/c is to: TBD              Anticipated d/c date is: 1 day              Patient currently is medically stable to d/c.  Awaiting placement or safe discharge plan  Consultants:   TOC   Procedures:   2D Echocardiogram   Antimicrobials:  n/a   Subjective: Pt says he takes a higher dose of pain medication than what is given.  He is agreeable to SNF rehab placement.    Objective: Vitals:   05/26/20 1500 05/26/20 2137 05/27/20 0327 05/27/20 0330  BP: 110/69 (!) 142/81 127/71   Pulse: 67 78 65   Resp: 19 20  16  Temp: 97.9 F (36.6 C) 97.8 F (36.6 C) (!) 97.5 F (36.4 C)   TempSrc: Oral Oral Oral   SpO2: 98% 97% 97%   Weight:      Height:        Intake/Output Summary (Last 24 hours) at 05/27/2020 1345 Last data filed at 05/26/2020 1931 Gross per 24 hour  Intake 240 ml  Output 800 ml  Net -560 ml   Filed Weights   05/25/20 1721 05/25/20 1850  Weight: 119 kg 119 kg    Examination:  General exam: speech impediment, chronic facial droop, chronic tongue deviation from prior stroke, Appears calm and comfortable, difficult to understand speech.  Respiratory system: Clear to auscultation. Respiratory effort normal. Cardiovascular system:  normal S1 & S2 heard. No JVD, murmurs, rubs, gallops or clicks. No pedal edema. Gastrointestinal system: Abdomen is nondistended, soft and nontender. No organomegaly or masses felt. Normal bowel sounds heard. Central nervous system: Alert and oriented. Chronic old stroke deficits.  Extremities: Symmetric 5 x 5 power. Osteoarthritic changes diffusely seen.  Skin: No rashes, lesions or ulcers Psychiatry: Judgement and insight appear poor. Mood & affect appropriate.   Data Reviewed: I have personally reviewed following labs and imaging studies  CBC: Recent Labs  Lab 05/24/20 1103  WBC 8.3  HGB 14.9  HCT 45.1  MCV 91.3  PLT 540    Basic Metabolic Panel: Recent Labs  Lab 05/24/20 1103  NA 138  K 3.8  CL 101  CO2 26  GLUCOSE 89  BUN 11  CREATININE 0.75  CALCIUM 8.8*    GFR: Estimated Creatinine Clearance: 136.9 mL/min (by C-G formula based on SCr of 0.75 mg/dL).  Liver Function Tests: No results for input(s): AST, ALT, ALKPHOS, BILITOT, PROT, ALBUMIN in the last 168 hours.  CBG: Recent Labs  Lab 05/26/20 1134  GLUCAP 126*    Recent Results (from the past 240 hour(s))  Resp Panel by RT-PCR (Flu A&B, Covid) Nasopharyngeal Swab     Status: None   Collection Time: 05/24/20 11:30 AM   Specimen: Nasopharyngeal Swab; Nasopharyngeal(NP) swabs in vial transport medium  Result Value Ref Range Status   SARS Coronavirus 2 by RT PCR NEGATIVE NEGATIVE Final    Comment: (NOTE) SARS-CoV-2 target nucleic acids are NOT DETECTED.  The SARS-CoV-2 RNA is generally detectable in upper respiratory specimens during the acute phase of infection. The lowest concentration of SARS-CoV-2 viral copies this assay can detect is 138 copies/mL. A negative result does not preclude SARS-Cov-2 infection and should not be used as the sole basis for treatment or other patient management decisions. A negative result may occur with  improper specimen collection/handling, submission of specimen  other than nasopharyngeal swab, presence of viral mutation(s) within the areas targeted by this assay, and inadequate number of viral copies(<138 copies/mL). A negative result must be combined with clinical observations, patient history, and epidemiological information. The expected result is Negative.  Fact Sheet for Patients:  EntrepreneurPulse.com.au  Fact Sheet for Healthcare Providers:  IncredibleEmployment.be  This test is no t yet approved or cleared by the [place] FDA and  has been authorized for detection and/or diagnosis of SARS-CoV-2 by FDA under an Emergency Use Authorization (EUA). This EUA will remain  in effect (meaning this test can be used) for the duration of the COVID-19 declaration under Section 564(b)(1) of the Act, 21 U.S.C.section 360bbb-3(b)(1), unless the authorization is terminated  or revoked sooner.       Influenza A by PCR NEGATIVE NEGATIVE  Final   Influenza B by PCR NEGATIVE NEGATIVE Final    Comment: (NOTE) The Xpert Xpress SARS-CoV-2/FLU/RSV plus assay is intended as an aid in the diagnosis of influenza from Nasopharyngeal swab specimens and should not be used as a sole basis for treatment. Nasal washings and aspirates are unacceptable for Xpert Xpress SARS-CoV-2/FLU/RSV testing.  Fact Sheet for Patients: EntrepreneurPulse.com.au  Fact Sheet for Healthcare Providers: IncredibleEmployment.be  This test is not yet approved or cleared by the [place] FDA and has been authorized for detection and/or diagnosis of SARS-CoV-2 by FDA under an Emergency Use Authorization (EUA). This EUA will remain in effect (meaning this test can be used) for the duration of the COVID-19 declaration under Section 564(b)(1) of the Act, 21 U.S.C. section 360bbb-3(b)(1), unless the authorization is terminated or revoked.  Performed at [facility], [address], [place], [place]  [address]      Radiology Studies: No results found. Scheduled Meds: . apixaban  5 mg Oral BID  . metoprolol tartrate  12.5 mg Oral BID  . pantoprazole  40 mg Oral Q0600   Continuous Infusions:   LOS: 1 day   Time spent: 30 mins   Chirstine Defrain Wynetta Emery, MD How to contact the [facility] Attending or Consulting provider [place] or covering provider during after hours [place], for this patient?  1. Check the care team in [facility] and look for a) attending/consulting [facility] provider listed and b) the [facility] team listed 2. Log into [email or web address] and use [place]'s universal password to access. If you do not have the password, please contact the hospital operator. 3. Locate the [facility] provider you are looking for under [facility] and page to a number that you can be directly reached. 4. If you still have difficulty reaching the provider, please page the [facility] (Director on Call) for the Hospitalists listed on amion for assistance.  05/27/2020, 1:45 PM

## 2020-05-27 NOTE — Progress Notes (Signed)
Physical Therapy Treatment Patient Details Name: Kyle [name] MRN: [id number] DOB: Nov 06, 1955 Today's Date: 05/27/2020    History of Present Illness 64 y.o. male, with past medical history of A. fib, on Eliquis, hypertension, COPD, obesity, CVA with right MCA infarct, embolic, status post lumpectomy, on Eliquis, patient presents to ED today secondary for chest pain, patient with severe dysarthria, very difficult to understand, only L able to nod his head, and understands few words, apparently patient was recently at [name] for few month, he was discharged and supposed to go to another assisted living facility, but she went to motel instead, he has been in the motel for 2 days, port he has been eating and drinking not much at the hotel, he does report intermittent chest pain, radiates to the left shoulder, reports stabbing sensation, will he does report some dyspnea, poor and vague historian, history is very unreliable, patient tells me he is currently homeless.  ED EKG was nonacute, and troponins were negative x2, initially he had reproducible chest pain on palpation, but this has resolved after receiving morphine, after discussion with Dr. Harl Bowie he recommended admission overnight for observation, and to obtain 2D echo in a.m., and if negative he can be discharged with outpatient follow-up.    PT Comments     Pt sitting up in bed eating lunch.  Agreeable to bed mobility (pt was way down and hunched over in bed) and to LE therex.  Pt with difficulty keeping feet in neutral during therex and required verbal and tactile cues for form/correct mm recruitment.  Pt able to move bed to assist with bed mobility and use of bed rails to pull self up.  Pt did not utilize LE's with pushing self up and required therapist cues and active assist to hold LE's in bent knee position to help push self up .  Bil LE therex completed following this. Bridging was most difficult due to gluteal weakness.   Pt reported general  fatique following activity but without complaints.           Precautions / Restrictions Precautions Precautions: Fall    Mobility  Bed Mobility Overal bed mobility: Needs Assistance             General bed mobility comments: bed mobility, positioning in bed; use of HR's and cues from therapist    Wheelchair Mobility    Modified Rankin (Stroke Patients Only)          Cognition Arousal/Alertness: Awake/alert Behavior During Therapy: WFL for tasks assessed/performed Overall Cognitive Status: Within Functional Limits for tasks assessed                                        Exercises General Exercises - Lower Extremity Ankle Circles/Pumps: AROM;Supine;Both;10 reps Gluteal Sets: AROM;Supine;Both;10 reps Hip ABduction/ADduction: AROM;Supine;Both;10 reps Straight Leg Raises: AROM;Supine;Both;10 reps      PT Goals (current goals can now be found in the care plan section) Progress towards PT goals: Progressing toward goals    Frequency    Min 3X/week      PT Plan  continue progression towards goals.    Co-evaluation              AM-PAC PT "6 Clicks" Mobility   Outcome Measure  Help needed turning from your back to your side while in a flat bed without using bedrails?: A Little Help needed moving from lying  on your back to sitting on the side of a flat bed without using bedrails?: A Little Help needed moving to and from a bed to a chair (including a wheelchair)?: A Little Help needed standing up from a chair using your arms (e.g., wheelchair or bedside chair)?: A Little Help needed to walk in [name] room?: A Lot Help needed climbing 3-5 steps with a railing? : A Lot 6 Click Score: 16    End of Session   Activity Tolerance: Patient limited by pain Patient left: in bed;with call bell/phone within reach   PT Visit Diagnosis: Unsteadiness on feet (R26.81);Other abnormalities of gait and mobility (R26.89);Muscle weakness  (generalized) (M62.81);Difficulty in walking, not elsewhere classified (R26.2)     Time: 7972-8206 PT Time Calculation (min) (ACUTE ONLY): 15 min  Charges:  $Therapeutic Activity: 8-22 mins                     Teena Irani, PTA/CLT [place], [name] 05/27/2020, 2:52 PM

## 2020-05-27 NOTE — TOC Progression Note (Signed)
Transition of Care [facility] - Progression Note    Patient Details  Name: Kyle [name] MRN: [id number] Date of Birth: 25-May-1956  Transition of Care [facility]) CM/SW Contact  Boneta Lucks, RN Phone Number: 05/27/2020, 4:33 PM  Clinical Narrative:   Patient is medically ready, TOC is not getting any bed offers, due to patient does not have a discharge plan after SNF.  TOC has called some facilities. Patient owes large amounts at [employer]. TOC spoke with Lenna Sciara - cousin she states his family will no longer help him. She stated [facility] did not want him to leave. TOC is reaching out to see if they will accept patient back to SNF. Lenna Sciara will makes some calls and work on discharge plan. Patient is usually at [facility], [facility] or [facility] to follow    Expected Discharge Plan: [place] Barriers to Discharge: Continued Medical Work up  Expected Discharge Plan and Services Expected Discharge Plan: [place] In-house Referral: Clinical Social Work Discharge Planning Services: NA Post Acute Care Choice: [place] Living arrangements for the past 2 months: Homeless                 DME Arranged: N/A DME Agency: NA       HH Arranged: NA [place] Agency: NA       Readmission Risk Interventions Readmission Risk Prevention Plan 08/08/2019  Transportation Screening Complete  Medication Review [employer]) Referral to Pharmacy  PCP or Specialist appointment within 3-5 days of discharge Complete  Skilled Nursing Facility Complete  Some recent data might be hidden

## 2020-05-28 MED ORDER — BUSPIRONE HCL 10 MG PO TABS: 10.0000 mg | ORAL_TABLET | Freq: Two times a day (BID) | ORAL | 0 refills | Status: AC

## 2020-05-28 MED ORDER — ATORVASTATIN CALCIUM 40 MG PO TABS: 40.0000 mg | ORAL_TABLET | Freq: Every day | ORAL | 0 refills | Status: AC

## 2020-05-28 MED ORDER — OXYCODONE HCL 15 MG PO TABS: 15.0000 mg | ORAL_TABLET | Freq: Four times a day (QID) | ORAL | 0 refills | Status: AC | PRN

## 2020-05-28 MED ORDER — NITROGLYCERIN 0.4 MG SL SUBL: 0.4000 mg | SUBLINGUAL_TABLET | SUBLINGUAL | 0 refills | Status: AC | PRN

## 2020-05-28 MED ORDER — ALPRAZOLAM 0.5 MG PO TABS: 0.5000 mg | ORAL_TABLET | Freq: Three times a day (TID) | ORAL | 0 refills | Status: AC | PRN

## 2020-05-28 MED ORDER — GABAPENTIN 300 MG PO CAPS: 300.0000 mg | ORAL_CAPSULE | Freq: Three times a day (TID) | ORAL | 0 refills | Status: AC | PRN

## 2020-05-28 MED ORDER — PANTOPRAZOLE SODIUM 20 MG PO TBEC: 20.0000 mg | DELAYED_RELEASE_TABLET | Freq: Every day | ORAL | 0 refills | Status: AC

## 2020-05-28 MED ORDER — METOPROLOL TARTRATE 25 MG PO TABS: 12.5000 mg | ORAL_TABLET | Freq: Two times a day (BID) | ORAL | 0 refills | Status: AC

## 2020-05-28 MED ORDER — SERTRALINE HCL 50 MG PO TABS
50.0000 mg | ORAL_TABLET | Freq: Every day | ORAL | 0 refills | Status: AC
Start: 2020-05-28 — End: ?

## 2020-05-28 MED ORDER — ACETAMINOPHEN 325 MG PO TABS: 650.0000 mg | ORAL_TABLET | ORAL | Status: AC | PRN

## 2020-05-28 MED ORDER — APIXABAN 5 MG PO TABS: 5.0000 mg | ORAL_TABLET | Freq: Two times a day (BID) | ORAL | 0 refills | Status: AC

## 2020-05-28 MED ORDER — OXYCODONE HCL 5 MG PO TABS
15.0000 mg | ORAL_TABLET | ORAL | Status: DC | PRN
  Administered 2020-05-28 (×2): 15 mg via ORAL
  Filled 2020-05-28 (×2): qty 3

## 2020-05-28 MED ORDER — ALBUTEROL SULFATE HFA 108 (90 BASE) MCG/ACT IN AERS: 2.0000 | INHALATION_SPRAY | RESPIRATORY_TRACT | 2 refills | Status: AC | PRN

## 2020-05-28 NOTE — Care Management Important Message (Signed)
Important Message  Patient Details  Name: Kyle [name] MRN: [id number] Date of Birth: August 05, 1955   Medicare Important Message Given:  Yes     Tommy Medal 05/28/2020, 11:40 AM

## 2020-05-28 NOTE — TOC Transition Note (Addendum)
Transition of Care [facility]) - CM/SW Discharge Note   Patient Details  Name: Kyle [name] MRN: [id number] Date of Birth: 10/05/1955  [facility]) CM/SW Contact:  Boneta Lucks, RN Phone Number: 05/28/2020, 2:15 PM   Clinical Narrative:   Patient has been medically ready for discharge for 3 days. Patient was last at [facility], patient has a outstanding balance. Patient states he does not have any money to pay balance. TOC has been on the phone with cousin - [name] multiple times, she is working and can not take him home. She is texting with patient as he is very difficult to understand. She explained to him he will go by cab to [employer], they will transport to [employer] in [place]. Confirmed that patient has a picture ID with him. AC provided a $50 cab voucher. Patient is to arrive in [place] by 5. TOC to call cab company again at [place] will be here at 4:15 , RN updated   Final next level of care: Homeless Shelter Barriers to Discharge: Barriers Resolved   Patient Goals and CMS Choice Patient states their goals for this hospitalization and ongoing recovery are:: will go to shelter and continue to work out something with family. CMS Medicare.gov Compare Post Acute Care list provided to:: Patient Choice offered to / list presented to : Patient  Discharge Placement              Patient chooses bed at: Other - please specify in the comment section below: ([facility]) Patient to be transferred to facility by: CAB Name of family member notified: [facility] [name] - cousin Patient and family notified of of transfer: 05/28/20  Discharge Plan and Services In-house Referral: Clinical Social Work Discharge Planning Services: NA Post Acute Care Choice: [place]          DME Arranged: N/A DME Agency: NA      HH Arranged: NA [place] Agency: NA    Readmission Risk Interventions Readmission Risk Prevention Plan 08/08/2019   Transportation Screening Complete  Medication Review [employer] [facility]) Referral to Pharmacy  PCP or Specialist appointment within 3-5 days of discharge Complete  Skilled [place] Complete  Some recent data might be hidden

## 2020-05-28 NOTE — Discharge Instructions (Signed)
Nonspecific Chest Pain Chest pain can be caused by many different conditions. Some causes of chest pain can be life-threatening. These will require treatment right away. Serious causes of chest pain include:  Heart attack.  A tear in the body's main blood vessel.  Redness and swelling (inflammation) around your heart.  Blood clot in your lungs. Other causes of chest pain may not be so serious. These include:  Heartburn.  Anxiety or stress.  Damage to bones or muscles in your chest.  Lung infections. Chest pain can feel like:  Pain or discomfort in your chest.  Crushing, pressure, aching, or squeezing pain.  Burning or tingling.  Dull or sharp pain that is worse when you move, cough, or take a deep breath.  Pain or discomfort that is also felt in your back, neck, jaw, shoulder, or arm, or pain that spreads to any of these areas. It is hard to know whether your pain is caused by something that is serious or something that is not so serious. So it is important to see your doctor right away if you have chest pain. Follow these instructions at home: Medicines  Take over-the-counter and prescription medicines only as told by your doctor.  If you were prescribed an antibiotic medicine, take it as told by your doctor. Do not stop taking the antibiotic even if you start to feel better. Lifestyle   Rest as told by your doctor.  Do not use any products that contain nicotine or tobacco, such as cigarettes, e-cigarettes, and chewing tobacco. If you need help quitting, ask your doctor.  Do not drink alcohol.  Make lifestyle changes as told by your doctor. These may include: ? Getting regular exercise. Ask your doctor what activities are safe for you. ? Eating a heart-healthy diet. A diet and nutrition specialist (dietitian) can help you to learn healthy eating options. ? Staying at a healthy weight. ? Treating diabetes or high blood pressure, if needed. ? Lowering your stress.  Activities such as yoga and relaxation techniques can help. General instructions  Pay attention to any changes in your symptoms. Tell your doctor about them or any new symptoms.  Avoid any activities that cause chest pain.  Keep all follow-up visits as told by your doctor. This is important. You may need more testing if your chest pain does not go away. Contact a doctor if:  Your chest pain does not go away.  You feel depressed.  You have a fever. Get help right away if:  Your chest pain is worse.  You have a cough that gets worse, or you cough up blood.  You have very bad (severe) pain in your belly (abdomen).  You pass out (faint).  You have either of these for no clear reason: ? Sudden chest discomfort. ? Sudden discomfort in your arms, back, neck, or jaw.  You have shortness of breath at any time.  You suddenly start to sweat, or your skin gets clammy.  You feel sick to your stomach (nauseous).  You throw up (vomit).  You suddenly feel lightheaded or dizzy.  You feel very weak or tired.  Your heart starts to beat fast, or it feels like it is skipping beats. These symptoms may be an emergency. Do not wait to see if the symptoms will go away. Get medical help right away. Call your local emergency services (911 in the [place] Do not drive yourself to the hospital. Summary  Chest pain can be caused by many different conditions. The   cause may be serious and need treatment right away. If you have chest pain, see your doctor right away.  Follow your doctor's instructions for taking medicines and making lifestyle changes.  Keep all follow-up visits as told by your doctor. This includes visits for any further testing if your chest pain does not go away.  Be sure to know the signs that show that your condition has become worse. Get help right away if you have these symptoms. This information is not intended to replace advice given to you by your health care provider. Make  sure you discuss any questions you have with your health care provider. Document Revised: 12/02/2017 Document Reviewed: 12/02/2017 Elsevier Patient Education  [place].    IMPORTANT INFORMATION: PAY CLOSE ATTENTION   PHYSICIAN DISCHARGE INSTRUCTIONS  Follow with Primary care provider  [name], MD  and other consultants as instructed by your Hospitalist Physician  [place] IF SYMPTOMS COME BACK, WORSEN OR NEW PROBLEM DEVELOPS   Please note: You were cared for by a hospitalist during your hospital stay. Every effort will be made to forward records to your primary care provider.  You can request that your primary care provider send for your hospital records if they have not received them.  Once you are discharged, your primary care physician will handle any further medical issues. Please note that NO REFILLS for any discharge medications will be authorized once you are discharged, as it is imperative that you return to your primary care physician (or establish a relationship with a primary care physician if you do not have one) for your post hospital discharge needs so that they can reassess your need for medications and monitor your lab values.  Please get a complete blood count and chemistry panel checked by your Primary MD at your next visit, and again as instructed by your Primary MD.  Get Medicines reviewed and adjusted: Please take all your medications with you for your next visit with your Primary MD  Laboratory/radiological data: Please request your Primary MD to go over all hospital tests and procedure/radiological results at the follow up, please ask your primary care provider to get all Hospital records sent to his/her office.  In some cases, they will be blood work, cultures and biopsy results pending at the time of your discharge. Please request that your primary care provider follow up on these results.  If you are diabetic,  please bring your blood sugar readings with you to your follow up appointment with primary care.    Please call and make your follow up appointments as soon as possible.    Also Note the following: If you experience worsening of your admission symptoms, develop shortness of breath, life threatening emergency, suicidal or homicidal thoughts you must seek medical attention immediately by calling 911 or calling your MD immediately  if symptoms less severe.  You must read complete instructions/literature along with all the possible adverse reactions/side effects for all the Medicines you take and that have been prescribed to you. Take any new Medicines after you have completely understood and accpet all the possible adverse reactions/side effects.   Do not drive when taking Pain medications or sleeping medications (Benzodiazepines)  Do not take more than prescribed Pain, Sleep and Anxiety Medications. It is not advisable to combine anxiety,sleep and pain medications without talking with your primary care practitioner  Special Instructions: If you have smoked or chewed Tobacco  in the last 2 yrs  please stop smoking, stop any regular Alcohol  and or any Recreational drug use.  Wear Seat belts while driving.  Do not drive if taking any narcotic, mind altering or controlled substances or recreational drugs or alcohol.

## 2020-05-28 NOTE — Discharge Summary (Signed)
Physician Discharge Summary  Kyle [name] NUU:[id number] DOB: 10-16-1955 DOA: 05/24/2020  PCP: [name], MD  Admit date: 05/24/2020 Discharge date: 05/28/2020  Disposition:  Home with [facility]   Recommendations for Outpatient Follow-up:  1. Follow up with PCP in 1 weeks 2. Please refer to outpatient pain management clinic   Home Health: PT, RN  Discharge Condition: STABLE   CODE STATUS: FULL    Brief Hospitalization Summary: Please see all hospital notes, images, labs for full details of the hospitalization. ADMISSION HPI:  64 y.o. male, with past medical history of A. fib, on Eliquis, hypertension, COPD, obesity, CVA with right MCA infarct, embolic, status post lumpectomy, on Eliquis, patient presents to ED today secondary for chest pain, patient with severe dysarthria, very difficult to understand, only L able to nod his head, and understands few words, apparently patient was recently at [facility] for few month, he was discharged and supposed to go to another assisted living facility, but she went to motel instead, he has been in the motel for 2 days, port he has been eating and drinking not much at the hotel, he does report intermittent chest pain, radiates to the left shoulder, reports stabbing sensation, will he does report some dyspnea, poor and vague historian, history is very unreliable, patient tells me he is currently homeless. -ED EKG was nonacute, and troponins were negative x2, initially he had reproducible chest pain on palpation, but this has resolved after receiving morphine, after discussion with Dr. Harl Bowie he recommended admission overnight for observation, and to obtain 2D echo in a.m., and if negative he can be discharged with outpatient follow-up.  Hospital Course  1. Atypical chest pain  - symptoms seem to have resolved now.  His HS troponin tests have been negative.  2D echocardiogram with NO findings of regional wall motion abnormalities.   2. Atrial fibrillation - stable  and rated controlled.  He is on apixaban for anticoagulation. Resumed home rate control medication. 3. Essential hypertension -BP stable, resumed home medication.  4. Chronic pain - resumed home pain management and pain seems controlled at this time.  I reviewed DC summary from [facility] 11/21 and it did have him as taking oxy IR 20 mg every 4 hours PRN.  I have increased oxycodone to 15 mg at this time but he will need to follow up with his primary care providers and I have recommended that they refer him to pain management clinic.   5. Homelessness - I have asked for a TOC consultation and they are working on placement.   DVT prophylaxis:  apixaban Code Status: full  Family Communication: none present  Disposition: Home with [facility]   Discharge Diagnoses:  Active Problems:   GERD (gastroesophageal reflux disease)   Adjustment disorder with mixed anxiety and depressed mood   Essential hypertension   Chronic atrial fibrillation (HCC)   Middle cerebral artery embolism, right   Chronic pain syndrome   Chest pain   Discharge Instructions:  Allergies as of 05/28/2020      Reactions   Vioxx [rofecoxib] Rash      Medication List    STOP taking these medications   acetaminophen 650 MG CR tablet Commonly known as: TYLENOL Replaced by: acetaminophen 325 MG tablet   carvedilol 3.125 MG tablet Commonly known as: COREG   cyclobenzaprine 10 MG tablet Commonly known as: FLEXERIL   diphenhydrAMINE 25 mg capsule Commonly known as: BENADRYL   feeding supplement (PRO-STAT SUGAR FREE 64) Liqd  lisinopril 40 MG tablet Commonly known as: ZESTRIL   metoprolol tartrate 25 mg/10 mL Susp Commonly known as: LOPRESSOR Replaced by: metoprolol tartrate 25 MG tablet   oxyCODONE 5 MG/5ML solution Commonly known as: ROXICODONE Replaced by: oxyCODONE 15 MG immediate release tablet     TAKE these medications   acetaminophen 325 MG tablet Commonly known as: TYLENOL Take 2  tablets (650 mg total) by mouth every 4 (four) hours as needed for headache or mild pain. Replaces: acetaminophen 650 MG CR tablet   albuterol 108 (90 Base) MCG/ACT inhaler Commonly known as: VENTOLIN HFA Inhale 2 puffs into the lungs every 4 (four) hours as needed for wheezing or shortness of breath.   ALPRAZolam 0.5 MG tablet Commonly known as: XANAX Take 1 tablet (0.5 mg total) by mouth 3 (three) times daily as needed for up to 3 days for anxiety.   apixaban 5 MG Tabs tablet Commonly known as: ELIQUIS Take 1 tablet (5 mg total) by mouth 2 (two) times daily.   atorvastatin 40 MG tablet Commonly known as: LIPITOR Take 1 tablet (40 mg total) by mouth daily.   busPIRone 10 MG tablet Commonly known as: BUSPAR Take 1 tablet (10 mg total) by mouth in the morning and at bedtime.   gabapentin 300 MG capsule Commonly known as: NEURONTIN Take 1 capsule (300 mg total) by mouth 3 (three) times daily as needed (neuropathy pain). What changed:   when to take this  reasons to take this  Another medication with the same name was removed. Continue taking this medication, and follow the directions you see here.   metoprolol tartrate 25 MG tablet Commonly known as: LOPRESSOR Take 0.5 tablets (12.5 mg total) by mouth 2 (two) times daily. Replaces: metoprolol tartrate 25 mg/10 mL Susp   nitroGLYCERIN 0.4 MG SL tablet Commonly known as: NITROSTAT Place 1 tablet (0.4 mg total) under the tongue every 5 (five) minutes x 3 doses as needed for chest pain.   oxyCODONE 15 MG immediate release tablet Commonly known as: ROXICODONE Take 1 tablet (15 mg total) by mouth every 6 (six) hours as needed for up to 5 days for severe pain. Replaces: oxyCODONE 5 MG/5ML solution   pantoprazole 20 MG tablet Commonly known as: PROTONIX Take 1 tablet (20 mg total) by mouth daily. What changed: Another medication with the same name was removed. Continue taking this medication, and follow the directions you see  here.   sertraline 50 MG tablet Commonly known as: ZOLOFT Take 1 tablet (50 mg total) by mouth daily. What changed: Another medication with the same name was removed. Continue taking this medication, and follow the directions you see here.            Durable Medical Equipment  (From admission, onward)         Start     Ordered   05/28/20 1020  For home use only DME 3 n 1  Once        05/28/20 1019   05/28/20 1020  For home use only DME lightweight manual wheelchair with seat cushion  Once       Comments: Patient suffers from gait instability and severe arthritis and chronic pain which impairs their ability to perform daily activities like bathing, dressing, feeding, grooming, and toileting in the home.  A cane, crutch, or walker will not resolve  issue with performing activities of daily living. A wheelchair will allow patient to safely perform daily activities. Patient is not able to propel  themselves in the home using a standard weight wheelchair due to endurance and general weakness. Patient can self propel in the lightweight wheelchair. Length of need Lifetime. Accessories: elevating leg rests (ELRs), wheel locks, extensions and anti-tippers.   05/28/20 1020          Follow-up Information    [name], MD. Schedule an appointment as soon as possible for a visit in 1 week(s).   Specialty: Internal Medicine Why: Hospital Follow Up  Contact information: [address] [place]. [address] [place] [address] [phone]              Allergies  Allergen Reactions  . Vioxx [Rofecoxib] Rash   Allergies as of 05/28/2020      Reactions   Vioxx [rofecoxib] Rash      Medication List    STOP taking these medications   acetaminophen 650 MG CR tablet Commonly known as: TYLENOL Replaced by: acetaminophen 325 MG tablet   carvedilol 3.125 MG tablet Commonly known as: COREG   cyclobenzaprine 10 MG tablet Commonly known as: FLEXERIL   diphenhydrAMINE 25 mg  capsule Commonly known as: BENADRYL   feeding supplement (PRO-STAT SUGAR FREE 64) Liqd   lisinopril 40 MG tablet Commonly known as: ZESTRIL   metoprolol tartrate 25 mg/10 mL Susp Commonly known as: LOPRESSOR Replaced by: metoprolol tartrate 25 MG tablet   oxyCODONE 5 MG/5ML solution Commonly known as: ROXICODONE Replaced by: oxyCODONE 15 MG immediate release tablet     TAKE these medications   acetaminophen 325 MG tablet Commonly known as: TYLENOL Take 2 tablets (650 mg total) by mouth every 4 (four) hours as needed for headache or mild pain. Replaces: acetaminophen 650 MG CR tablet   albuterol 108 (90 Base) MCG/ACT inhaler Commonly known as: VENTOLIN HFA Inhale 2 puffs into the lungs every 4 (four) hours as needed for wheezing or shortness of breath.   ALPRAZolam 0.5 MG tablet Commonly known as: XANAX Take 1 tablet (0.5 mg total) by mouth 3 (three) times daily as needed for up to 3 days for anxiety.   apixaban 5 MG Tabs tablet Commonly known as: ELIQUIS Take 1 tablet (5 mg total) by mouth 2 (two) times daily.   atorvastatin 40 MG tablet Commonly known as: LIPITOR Take 1 tablet (40 mg total) by mouth daily.   busPIRone 10 MG tablet Commonly known as: BUSPAR Take 1 tablet (10 mg total) by mouth in the morning and at bedtime.   gabapentin 300 MG capsule Commonly known as: NEURONTIN Take 1 capsule (300 mg total) by mouth 3 (three) times daily as needed (neuropathy pain). What changed:   when to take this  reasons to take this  Another medication with the same name was removed. Continue taking this medication, and follow the directions you see here.   metoprolol tartrate 25 MG tablet Commonly known as: LOPRESSOR Take 0.5 tablets (12.5 mg total) by mouth 2 (two) times daily. Replaces: metoprolol tartrate 25 mg/10 mL Susp   nitroGLYCERIN 0.4 MG SL tablet Commonly known as: NITROSTAT Place 1 tablet (0.4 mg total) under the tongue every 5 (five) minutes x 3 doses  as needed for chest pain.   oxyCODONE 15 MG immediate release tablet Commonly known as: ROXICODONE Take 1 tablet (15 mg total) by mouth every 6 (six) hours as needed for up to 5 days for severe pain. Replaces: oxyCODONE 5 MG/5ML solution   pantoprazole 20 MG tablet Commonly known as: PROTONIX Take 1 tablet (20 mg total) by mouth daily. What  changed: Another medication with the same name was removed. Continue taking this medication, and follow the directions you see here.   sertraline 50 MG tablet Commonly known as: ZOLOFT Take 1 tablet (50 mg total) by mouth daily. What changed: Another medication with the same name was removed. Continue taking this medication, and follow the directions you see here.            Durable Medical Equipment  (From admission, onward)         Start     Ordered   05/28/20 1020  For home use only DME 3 n 1  Once        05/28/20 1019   05/28/20 1020  For home use only DME lightweight manual wheelchair with seat cushion  Once       Comments: Patient suffers from gait instability and severe arthritis and chronic pain which impairs their ability to perform daily activities like bathing, dressing, feeding, grooming, and toileting in the home.  A cane, crutch, or walker will not resolve  issue with performing activities of daily living. A wheelchair will allow patient to safely perform daily activities. Patient is not able to propel themselves in the home using a standard weight wheelchair due to endurance and general weakness. Patient can self propel in the lightweight wheelchair. Length of need Lifetime. Accessories: elevating leg rests (ELRs), wheel locks, extensions and anti-tippers.   05/28/20 1020          Procedures/Studies: DG Chest Port 1 View  Result Date: 05/24/2020 CLINICAL DATA:  Chest pain today. EXAM: PORTABLE CHEST 1 VIEW COMPARISON:  04/24/2020 FINDINGS: Cardiopericardial silhouette is borderline enlarged. No mediastinal or hilar  masses. Prominent lung markings, stable.  Lungs otherwise clear. No pleural effusion or pneumothorax. Skeletal structures are grossly intact. IMPRESSION: No acute cardiopulmonary disease. Electronically Signed   By: Lajean Manes M.D.   On: 05/24/2020 11:03   ECHOCARDIOGRAM COMPLETE  Result Date: 05/25/2020    ECHOCARDIOGRAM REPORT   Patient Name:   Kyle [name] [facility] Date of Exam: 05/25/2020 Medical Rec #:  [id number]        Height:       77.0 in Accession #:    [id number]       Weight:       351.0 lb Date of Birth:  22-Sep-1955         BSA:          2.841 m Patient Age:    64 years         BP:           135/73 mmHg Patient Gender: M                HR:           72 bpm. Exam Location:  [facility] Procedure: 2D Echo, Cardiac Doppler and Color Doppler Indications:    Chest pain  History:        Patient has prior history of Echocardiogram examinations, most                 recent 04/30/2019. Stroke, Arrythmias:Atrial Fibrillation,                 Signs/Symptoms:Chest Pain; Risk Factors:Hypertension, Former                 Smoker and Obesity.  Sonographer:    Dustin Flock RDCS Referring Phys: [id number] DAWOOD Graciela Husbands  Sonographer Comments: Image acquisition challenging due to COPD and Image  acquisition challenging due to patient body habitus. IMPRESSIONS  1. There has been no change since the prior study on 04/30/2019.  2. Left ventricular ejection fraction, by estimation, is 60 to 65%. The left ventricle has normal function. The left ventricle has no regional wall motion abnormalities. Left ventricular diastolic parameters are consistent with Grade I diastolic dysfunction (impaired relaxation).  3. Right ventricular systolic function is normal. The right ventricular size is normal.  4. Left atrial size was mildly dilated.  5. Right atrial size was mildly dilated.  6. The mitral valve is normal in structure. Mild mitral valve regurgitation. No evidence of mitral stenosis. Moderate mitral annular calcification.  7.  The aortic valve is normal in structure. There is mild calcification of the aortic valve. There is mild thickening of the aortic valve. Aortic valve regurgitation is not visualized. Mild to moderate aortic valve sclerosis/calcification is present, without any evidence of aortic stenosis.  8. The inferior vena cava is normal in size with greater than 50% respiratory variability, suggesting right atrial pressure of 3 mmHg. FINDINGS  Left Ventricle: Left ventricular ejection fraction, by estimation, is 60 to 65%. The left ventricle has normal function. The left ventricle has no regional wall motion abnormalities. The left ventricular internal cavity size was normal in size. There is  no left ventricular hypertrophy. Left ventricular diastolic parameters are consistent with Grade I diastolic dysfunction (impaired relaxation). Normal left ventricular filling pressure. Right Ventricle: The right ventricular size is normal. No increase in right ventricular wall thickness. Right ventricular systolic function is normal. Left Atrium: Left atrial size was mildly dilated. Right Atrium: Right atrial size was mildly dilated. Pericardium: There is no evidence of pericardial effusion. Mitral Valve: The mitral valve is normal in structure. Moderate mitral annular calcification. Mild mitral valve regurgitation. No evidence of mitral valve stenosis. Tricuspid Valve: The tricuspid valve is normal in structure. Tricuspid valve regurgitation is mild . No evidence of tricuspid stenosis. Aortic Valve: The aortic valve is normal in structure. There is mild calcification of the aortic valve. There is mild thickening of the aortic valve. Aortic valve regurgitation is not visualized. Mild to moderate aortic valve sclerosis/calcification is present, without any evidence of aortic stenosis. Pulmonic Valve: The pulmonic valve was normal in structure. Pulmonic valve regurgitation is not visualized. No evidence of pulmonic stenosis. Aorta: The  aortic root is normal in size and structure. Venous: The inferior vena cava is normal in size with greater than 50% respiratory variability, suggesting right atrial pressure of 3 mmHg. IAS/Shunts: No atrial level shunt detected by color flow Doppler.  LEFT VENTRICLE PLAX 2D LVIDd:         4.11 cm  Diastology LVIDs:         2.91 cm  LV e' medial:    9.46 cm/s LV PW:         1.36 cm  LV E/e' medial:  11.5 LV IVS:        1.16 cm  LV e' lateral:   12.10 cm/s LVOT diam:     2.10 cm  LV E/e' lateral: 9.0 LV SV:         62 LV SV Index:   22 LVOT Area:     3.46 cm  RIGHT VENTRICLE RV Basal diam:  3.44 cm RV S prime:     12.70 cm/s TAPSE (M-mode): 3.1 cm LEFT ATRIUM             Index       RIGHT ATRIUM  Index LA diam:        3.90 cm 1.37 cm/m  RA Area:     22.60 cm LA Vol (A2C):   60.2 ml 21.19 ml/m RA Volume:   65.20 ml  22.95 ml/m LA Vol (A4C):   59.8 ml 21.05 ml/m LA Biplane Vol: 60.8 ml 21.40 ml/m  AORTIC VALVE LVOT Vmax:   74.60 cm/s LVOT Vmean:  55.400 cm/s LVOT VTI:    0.178 m  AORTA Ao Root diam: 3.20 cm MITRAL VALVE MV Area (PHT): 3.21 cm     SHUNTS MV Decel Time: 236 msec     Systemic VTI:  0.18 m MV E velocity: 109.00 cm/s  Systemic Diam: 2.10 cm Ena Dawley MD Electronically signed by Ena Dawley MD Signature Date/Time: 05/25/2020/1:10:13 PM    Final       Subjective: Pt reports that he has no chest pain today.  He says that he has been on 20 mg of oxycodone in the past.    Discharge Exam: Vitals:   05/27/20 2102 05/28/20 0557  BP: 123/73 135/86  Pulse: 67 63  Resp: 18 18  Temp: 97.6 F (36.4 C) 97.8 F (36.6 C)  SpO2: 99% 91%   Vitals:   05/27/20 0330 05/27/20 1404 05/27/20 2102 05/28/20 0557  BP:  134/73 123/73 135/86  Pulse:  87 67 63  Resp: 16 16 18 18   Temp:  97.8 F (36.6 C) 97.6 F (36.4 C) 97.8 F (36.6 C)  TempSrc:  Oral Oral Oral  SpO2:  97% 99% 91%  Weight:      Height:       General: Pt is alert, awake, not in acute distress Cardiovascular:  RRR, S1/S2 +, no rubs, no gallops Respiratory: CTA bilaterally, no wheezing, no rhonchi Abdominal: Soft, NT, ND, bowel sounds + Extremities: no edema, no cyanosis   The results of significant diagnostics from this hospitalization (including imaging, microbiology, ancillary and laboratory) are listed below for reference.     Microbiology: Recent Results (from the past 240 hour(s))  Resp Panel by RT-PCR (Flu A&B, Covid) Nasopharyngeal Swab     Status: None   Collection Time: 05/24/20 11:30 AM   Specimen: Nasopharyngeal Swab; Nasopharyngeal(NP) swabs in vial transport medium  Result Value Ref Range Status   SARS Coronavirus 2 by RT PCR NEGATIVE NEGATIVE Final    Comment: (NOTE) SARS-CoV-2 target nucleic acids are NOT DETECTED.  The SARS-CoV-2 RNA is generally detectable in upper respiratory specimens during the acute phase of infection. The lowest concentration of SARS-CoV-2 viral copies this assay can detect is 138 copies/mL. A negative result does not preclude SARS-Cov-2 infection and should not be used as the sole basis for treatment or other patient management decisions. A negative result may occur with  improper specimen collection/handling, submission of specimen other than nasopharyngeal swab, presence of viral mutation(s) within the areas targeted by this assay, and inadequate number of viral copies(<138 copies/mL). A negative result must be combined with clinical observations, patient history, and epidemiological information. The expected result is Negative.  Fact Sheet for Patients:  EntrepreneurPulse.com.au  Fact Sheet for Healthcare Providers:  IncredibleEmployment.be  This test is no t yet approved or cleared by the [place] FDA and  has been authorized for detection and/or diagnosis of SARS-CoV-2 by FDA under an Emergency Use Authorization (EUA). This EUA will remain  in effect (meaning this test can be used) for the duration  of the COVID-19 declaration under Section 564(b)(1) of the Act, 21 U.S.C.section 360bbb-3(b)(1),  unless the authorization is terminated  or revoked sooner.       Influenza A by PCR NEGATIVE NEGATIVE Final   Influenza B by PCR NEGATIVE NEGATIVE Final    Comment: (NOTE) The Xpert Xpress SARS-CoV-2/FLU/RSV plus assay is intended as an aid in the diagnosis of influenza from Nasopharyngeal swab specimens and should not be used as a sole basis for treatment. Nasal washings and aspirates are unacceptable for Xpert Xpress SARS-CoV-2/FLU/RSV testing.  Fact Sheet for Patients: EntrepreneurPulse.com.au  Fact Sheet for Healthcare Providers: IncredibleEmployment.be  This test is not yet approved or cleared by the [place] FDA and has been authorized for detection and/or diagnosis of SARS-CoV-2 by FDA under an Emergency Use Authorization (EUA). This EUA will remain in effect (meaning this test can be used) for the duration of the COVID-19 declaration under Section 564(b)(1) of the Act, 21 U.S.C. section 360bbb-3(b)(1), unless the authorization is terminated or revoked.  Performed at [facility], [address], [place], [place] [address]      Labs: BNP (last 3 results) Recent Labs    06/08/19 1855 10/27/19 0925  BNP 82.3 82.8   Basic Metabolic Panel: Recent Labs  Lab 05/24/20 1103  NA 138  K 3.8  CL 101  CO2 26  GLUCOSE 89  BUN 11  CREATININE 0.75  CALCIUM 8.8*   Liver Function Tests: No results for input(s): AST, ALT, ALKPHOS, BILITOT, PROT, ALBUMIN in the last 168 hours. No results for input(s): LIPASE, AMYLASE in the last 168 hours. No results for input(s): AMMONIA in the last 168 hours. CBC: Recent Labs  Lab 05/24/20 1103  WBC 8.3  HGB 14.9  HCT 45.1  MCV 91.3  PLT 218   Cardiac Enzymes: No results for input(s): CKTOTAL, CKMB, CKMBINDEX, TROPONINI in the last 168 hours. BNP: Invalid input(s): POCBNP CBG: Recent  Labs  Lab 05/26/20 1134  GLUCAP 126*   D-Dimer No results for input(s): DDIMER in the last 72 hours. Hgb A1c No results for input(s): HGBA1C in the last 72 hours. Lipid Profile No results for input(s): CHOL, HDL, LDLCALC, TRIG, CHOLHDL, LDLDIRECT in the last 72 hours. Thyroid function studies No results for input(s): TSH, T4TOTAL, T3FREE, THYROIDAB in the last 72 hours.  Invalid input(s): FREET3 Anemia work up No results for input(s): VITAMINB12, FOLATE, FERRITIN, TIBC, IRON, RETICCTPCT in the last 72 hours. Urinalysis    Component Value Date/Time   COLORURINE YELLOW (A) 10/27/2019 2211   APPEARANCEUR HAZY (A) 10/27/2019 2211   LABSPEC 1.018 10/27/2019 2211   PHURINE 5.0 10/27/2019 2211   GLUCOSEU NEGATIVE 10/27/2019 2211   HGBUR LARGE (A) 10/27/2019 2211   BILIRUBINUR NEGATIVE 10/27/2019 2211   KETONESUR NEGATIVE 10/27/2019 2211   PROTEINUR 30 (A) 10/27/2019 2211   NITRITE POSITIVE (A) 10/27/2019 2211   LEUKOCYTESUR SMALL (A) 10/27/2019 2211   Sepsis Labs Invalid input(s): PROCALCITONIN,  WBC,  LACTICIDVEN Microbiology Recent Results (from the past 240 hour(s))  Resp Panel by RT-PCR (Flu A&B, Covid) Nasopharyngeal Swab     Status: None   Collection Time: 05/24/20 11:30 AM   Specimen: Nasopharyngeal Swab; Nasopharyngeal(NP) swabs in vial transport medium  Result Value Ref Range Status   SARS Coronavirus 2 by RT PCR NEGATIVE NEGATIVE Final    Comment: (NOTE) SARS-CoV-2 target nucleic acids are NOT DETECTED.  The SARS-CoV-2 RNA is generally detectable in upper respiratory specimens during the acute phase of infection. The lowest concentration of SARS-CoV-2 viral copies this assay can detect is 138 copies/mL. A negative result does  not preclude SARS-Cov-2 infection and should not be used as the sole basis for treatment or other patient management decisions. A negative result may occur with  improper specimen collection/handling, submission of specimen other than  nasopharyngeal swab, presence of viral mutation(s) within the areas targeted by this assay, and inadequate number of viral copies(<138 copies/mL). A negative result must be combined with clinical observations, patient history, and epidemiological information. The expected result is Negative.  Fact Sheet for Patients:  EntrepreneurPulse.com.au  Fact Sheet for Healthcare Providers:  IncredibleEmployment.be  This test is no t yet approved or cleared by the [place] FDA and  has been authorized for detection and/or diagnosis of SARS-CoV-2 by FDA under an Emergency Use Authorization (EUA). This EUA will remain  in effect (meaning this test can be used) for the duration of the COVID-19 declaration under Section 564(b)(1) of the Act, 21 U.S.C.section 360bbb-3(b)(1), unless the authorization is terminated  or revoked sooner.       Influenza A by PCR NEGATIVE NEGATIVE Final   Influenza B by PCR NEGATIVE NEGATIVE Final    Comment: (NOTE) The Xpert Xpress SARS-CoV-2/FLU/RSV plus assay is intended as an aid in the diagnosis of influenza from Nasopharyngeal swab specimens and should not be used as a sole basis for treatment. Nasal washings and aspirates are unacceptable for Xpert Xpress SARS-CoV-2/FLU/RSV testing.  Fact Sheet for Patients: EntrepreneurPulse.com.au  Fact Sheet for Healthcare Providers: IncredibleEmployment.be  This test is not yet approved or cleared by the [place] FDA and has been authorized for detection and/or diagnosis of SARS-CoV-2 by FDA under an Emergency Use Authorization (EUA). This EUA will remain in effect (meaning this test can be used) for the duration of the COVID-19 declaration under Section 564(b)(1) of the Act, 21 U.S.C. section 360bbb-3(b)(1), unless the authorization is terminated or revoked.  Performed at [facility], [address], [place], [place] [address]     Time coordinating discharge:  36 minutes   SIGNED:  Irwin Brakeman, MD  [facility] 05/28/2020, 10:37 AM How to contact the [facility] Attending or Consulting provider [place] or covering provider during after hours [place], for this patient?  1. Check the care team in [facility] and look for a) attending/consulting [facility] provider listed and b) the [facility] team listed 2. Log into [email or web address] and use [place]'s universal password to access. If you do not have the password, please contact the hospital operator. 3. Locate the [facility] provider you are looking for under [facility] and page to a number that you can be directly reached. 4. If you still have difficulty reaching the provider, please page the [facility] (Director on Call) for the Hospitalists listed on amion for assistance.

## 2023-11-14 DEATH — deceased

## 2023-12-10 ENCOUNTER — Encounter (HOSPITAL_COMMUNITY): Payer: Self-pay | Admitting: Interventional Radiology
